# Patient Record
Sex: Female | Born: 1945
Health system: Southern US, Community
[De-identification: ages and names within clinical notes are randomized; demographics above are authoritative.]

## PROBLEM LIST (undated history)

## (undated) DIAGNOSIS — G4733 Obstructive sleep apnea (adult) (pediatric): Secondary | ICD-10-CM

## (undated) DIAGNOSIS — K589 Irritable bowel syndrome without diarrhea: Secondary | ICD-10-CM

## (undated) DIAGNOSIS — M1990 Unspecified osteoarthritis, unspecified site: Secondary | ICD-10-CM

## (undated) DIAGNOSIS — F32A Depression, unspecified: Secondary | ICD-10-CM

## (undated) DIAGNOSIS — Z9889 Other specified postprocedural states: Secondary | ICD-10-CM

## (undated) DIAGNOSIS — Z8719 Personal history of other diseases of the digestive system: Secondary | ICD-10-CM

## (undated) DIAGNOSIS — E119 Type 2 diabetes mellitus without complications: Secondary | ICD-10-CM

## (undated) DIAGNOSIS — Z87442 Personal history of urinary calculi: Secondary | ICD-10-CM

## (undated) DIAGNOSIS — Z86010 Personal history of colonic polyps: Secondary | ICD-10-CM

## (undated) DIAGNOSIS — K209 Esophagitis, unspecified without bleeding: Secondary | ICD-10-CM

## (undated) DIAGNOSIS — K219 Gastro-esophageal reflux disease without esophagitis: Secondary | ICD-10-CM

## (undated) DIAGNOSIS — G4730 Sleep apnea, unspecified: Secondary | ICD-10-CM

## (undated) DIAGNOSIS — N200 Calculus of kidney: Secondary | ICD-10-CM

## (undated) DIAGNOSIS — J45909 Unspecified asthma, uncomplicated: Secondary | ICD-10-CM

## (undated) DIAGNOSIS — T7840XA Allergy, unspecified, initial encounter: Secondary | ICD-10-CM

## (undated) DIAGNOSIS — K449 Diaphragmatic hernia without obstruction or gangrene: Secondary | ICD-10-CM

## (undated) DIAGNOSIS — D869 Sarcoidosis, unspecified: Secondary | ICD-10-CM

## (undated) DIAGNOSIS — E785 Hyperlipidemia, unspecified: Secondary | ICD-10-CM

## (undated) DIAGNOSIS — R011 Cardiac murmur, unspecified: Secondary | ICD-10-CM

## (undated) DIAGNOSIS — I639 Cerebral infarction, unspecified: Secondary | ICD-10-CM

## (undated) DIAGNOSIS — H269 Unspecified cataract: Secondary | ICD-10-CM

## (undated) DIAGNOSIS — F419 Anxiety disorder, unspecified: Secondary | ICD-10-CM

## (undated) DIAGNOSIS — F329 Major depressive disorder, single episode, unspecified: Secondary | ICD-10-CM

## (undated) DIAGNOSIS — R0600 Dyspnea, unspecified: Secondary | ICD-10-CM

## (undated) DIAGNOSIS — H9190 Unspecified hearing loss, unspecified ear: Secondary | ICD-10-CM

## (undated) DIAGNOSIS — I10 Essential (primary) hypertension: Secondary | ICD-10-CM

## (undated) HISTORY — DX: Sleep apnea, unspecified: G47.30

## (undated) HISTORY — DX: Esophagitis, unspecified without bleeding: K20.90

## (undated) HISTORY — PX: TONSILLECTOMY AND ADENOIDECTOMY: SUR1326

## (undated) HISTORY — PX: NASAL SEPTUM SURGERY: SHX37

## (undated) HISTORY — DX: Other specified postprocedural states: Z98.890

## (undated) HISTORY — DX: Gastro-esophageal reflux disease without esophagitis: K21.9

## (undated) HISTORY — DX: Calculus of kidney: N20.0

## (undated) HISTORY — DX: Esophagitis, unspecified: K20.9

## (undated) HISTORY — DX: Depression, unspecified: F32.A

## (undated) HISTORY — PX: PARTIAL HYSTERECTOMY: SHX80

## (undated) HISTORY — DX: Allergy, unspecified, initial encounter: T78.40XA

## (undated) HISTORY — PX: CATARACT EXTRACTION: SUR2

## (undated) HISTORY — DX: Hyperlipidemia, unspecified: E78.5

## (undated) HISTORY — DX: Diaphragmatic hernia without obstruction or gangrene: K44.9

## (undated) HISTORY — DX: Major depressive disorder, single episode, unspecified: F32.9

## (undated) HISTORY — DX: Type 2 diabetes mellitus without complications: E11.9

## (undated) HISTORY — DX: Unspecified cataract: H26.9

## (undated) HISTORY — DX: Irritable bowel syndrome, unspecified: K58.9

## (undated) HISTORY — DX: Cerebral infarction, unspecified: I63.9

## (undated) HISTORY — DX: Personal history of other diseases of the digestive system: Z87.19

## (undated) HISTORY — DX: Sarcoidosis, unspecified: D86.9

## (undated) HISTORY — DX: Obstructive sleep apnea (adult) (pediatric): G47.33

## (undated) HISTORY — DX: Unspecified hearing loss, unspecified ear: H91.90

## (undated) HISTORY — DX: Anxiety disorder, unspecified: F41.9

## (undated) HISTORY — DX: Unspecified asthma, uncomplicated: J45.909

## (undated) HISTORY — PX: TUBAL LIGATION: SHX77

## (undated) HISTORY — DX: Personal history of colonic polyps: Z86.010

## (undated) HISTORY — PX: HEMORRHOID SURGERY: SHX153

## (undated) HISTORY — PX: BLADDER SURGERY: SHX569

## (undated) HISTORY — DX: Essential (primary) hypertension: I10

---

## 1997-02-04 ENCOUNTER — Encounter: Payer: Self-pay | Admitting: Internal Medicine

## 1999-03-23 ENCOUNTER — Encounter: Payer: Self-pay | Admitting: Internal Medicine

## 1999-03-23 ENCOUNTER — Ambulatory Visit (HOSPITAL_COMMUNITY): Admission: RE | Admit: 1999-03-23 | Discharge: 1999-03-23 | Payer: Self-pay | Admitting: Internal Medicine

## 1999-08-19 ENCOUNTER — Other Ambulatory Visit: Admission: RE | Admit: 1999-08-19 | Discharge: 1999-08-19 | Payer: Self-pay | Admitting: Gynecology

## 2001-03-27 ENCOUNTER — Encounter (INDEPENDENT_AMBULATORY_CARE_PROVIDER_SITE_OTHER): Payer: Self-pay

## 2001-03-27 ENCOUNTER — Ambulatory Visit: Admission: RE | Admit: 2001-03-27 | Discharge: 2001-03-27 | Payer: Self-pay | Admitting: Internal Medicine

## 2001-04-25 ENCOUNTER — Encounter: Payer: Self-pay | Admitting: Thoracic Surgery

## 2001-04-27 ENCOUNTER — Ambulatory Visit (HOSPITAL_COMMUNITY): Admission: RE | Admit: 2001-04-27 | Discharge: 2001-04-27 | Payer: Self-pay | Admitting: Thoracic Surgery

## 2001-04-27 ENCOUNTER — Encounter (INDEPENDENT_AMBULATORY_CARE_PROVIDER_SITE_OTHER): Payer: Self-pay | Admitting: *Deleted

## 2001-05-29 ENCOUNTER — Encounter: Payer: Self-pay | Admitting: Thoracic Surgery

## 2001-05-29 ENCOUNTER — Encounter: Admission: RE | Admit: 2001-05-29 | Discharge: 2001-05-29 | Payer: Self-pay | Admitting: Thoracic Surgery

## 2002-02-05 ENCOUNTER — Other Ambulatory Visit: Admission: RE | Admit: 2002-02-05 | Discharge: 2002-02-05 | Payer: Self-pay | Admitting: Gynecology

## 2006-06-20 ENCOUNTER — Ambulatory Visit: Payer: Self-pay | Admitting: Internal Medicine

## 2006-11-03 ENCOUNTER — Ambulatory Visit: Payer: Self-pay | Admitting: Internal Medicine

## 2006-11-17 ENCOUNTER — Ambulatory Visit: Payer: Self-pay | Admitting: Internal Medicine

## 2006-12-15 ENCOUNTER — Ambulatory Visit: Payer: Self-pay | Admitting: Internal Medicine

## 2007-01-12 ENCOUNTER — Ambulatory Visit: Payer: Self-pay | Admitting: Internal Medicine

## 2007-03-13 ENCOUNTER — Ambulatory Visit: Payer: Self-pay | Admitting: Internal Medicine

## 2007-03-27 ENCOUNTER — Ambulatory Visit: Payer: Self-pay | Admitting: Internal Medicine

## 2007-06-13 ENCOUNTER — Ambulatory Visit: Payer: Self-pay | Admitting: Internal Medicine

## 2007-08-07 ENCOUNTER — Inpatient Hospital Stay (HOSPITAL_COMMUNITY): Admission: RE | Admit: 2007-08-07 | Discharge: 2007-08-08 | Payer: Self-pay | Admitting: Obstetrics and Gynecology

## 2007-08-08 ENCOUNTER — Encounter (INDEPENDENT_AMBULATORY_CARE_PROVIDER_SITE_OTHER): Payer: Self-pay | Admitting: *Deleted

## 2007-09-11 ENCOUNTER — Ambulatory Visit: Payer: Self-pay | Admitting: Internal Medicine

## 2007-11-16 DIAGNOSIS — K589 Irritable bowel syndrome without diarrhea: Secondary | ICD-10-CM

## 2007-11-16 DIAGNOSIS — K219 Gastro-esophageal reflux disease without esophagitis: Secondary | ICD-10-CM

## 2007-11-16 DIAGNOSIS — D869 Sarcoidosis, unspecified: Secondary | ICD-10-CM

## 2007-11-16 DIAGNOSIS — J4550 Severe persistent asthma, uncomplicated: Secondary | ICD-10-CM

## 2007-11-16 HISTORY — DX: Severe persistent asthma, uncomplicated: J45.50

## 2008-05-02 DIAGNOSIS — K209 Esophagitis, unspecified without bleeding: Secondary | ICD-10-CM

## 2008-05-02 DIAGNOSIS — I10 Essential (primary) hypertension: Secondary | ICD-10-CM

## 2008-05-02 DIAGNOSIS — R159 Full incontinence of feces: Secondary | ICD-10-CM

## 2008-05-02 DIAGNOSIS — H9190 Unspecified hearing loss, unspecified ear: Secondary | ICD-10-CM

## 2008-05-02 DIAGNOSIS — K449 Diaphragmatic hernia without obstruction or gangrene: Secondary | ICD-10-CM

## 2008-05-02 HISTORY — DX: Diaphragmatic hernia without obstruction or gangrene: K44.9

## 2008-05-02 HISTORY — DX: Full incontinence of feces: R15.9

## 2008-05-02 HISTORY — DX: Esophagitis, unspecified without bleeding: K20.90

## 2008-05-02 HISTORY — DX: Essential (primary) hypertension: I10

## 2008-05-02 HISTORY — DX: Unspecified hearing loss, unspecified ear: H91.90

## 2008-05-05 ENCOUNTER — Ambulatory Visit: Payer: Self-pay | Admitting: Internal Medicine

## 2008-05-13 ENCOUNTER — Ambulatory Visit: Payer: Self-pay | Admitting: Internal Medicine

## 2008-05-13 ENCOUNTER — Encounter: Payer: Self-pay | Admitting: Internal Medicine

## 2008-05-15 ENCOUNTER — Encounter: Payer: Self-pay | Admitting: Internal Medicine

## 2008-05-26 ENCOUNTER — Ambulatory Visit: Payer: Self-pay | Admitting: Internal Medicine

## 2008-05-27 LAB — CONVERTED CEMR LAB
Fecal Occult Blood: NEGATIVE
OCCULT 1: NEGATIVE
OCCULT 2: NEGATIVE
OCCULT 3: NEGATIVE
OCCULT 4: NEGATIVE
OCCULT 5: NEGATIVE

## 2009-07-24 ENCOUNTER — Encounter: Admission: RE | Admit: 2009-07-24 | Discharge: 2009-07-24 | Payer: Self-pay | Admitting: Specialist

## 2009-11-28 DIAGNOSIS — Z86010 Personal history of colonic polyps: Secondary | ICD-10-CM

## 2009-11-28 DIAGNOSIS — Z860101 Personal history of adenomatous and serrated colon polyps: Secondary | ICD-10-CM | POA: Insufficient documentation

## 2009-11-28 HISTORY — DX: Personal history of colonic polyps: Z86.010

## 2009-11-28 HISTORY — DX: Personal history of adenomatous and serrated colon polyps: Z86.0101

## 2010-03-03 ENCOUNTER — Ambulatory Visit: Payer: Self-pay | Admitting: Internal Medicine

## 2010-03-03 DIAGNOSIS — R159 Full incontinence of feces: Secondary | ICD-10-CM | POA: Insufficient documentation

## 2010-03-03 DIAGNOSIS — R1031 Right lower quadrant pain: Secondary | ICD-10-CM

## 2010-03-03 DIAGNOSIS — E119 Type 2 diabetes mellitus without complications: Secondary | ICD-10-CM

## 2010-03-03 HISTORY — DX: Full incontinence of feces: R15.9

## 2010-03-03 HISTORY — DX: Type 2 diabetes mellitus without complications: E11.9

## 2010-03-03 HISTORY — DX: Right lower quadrant pain: R10.31

## 2010-04-02 ENCOUNTER — Ambulatory Visit: Payer: Self-pay | Admitting: Internal Medicine

## 2010-04-06 ENCOUNTER — Encounter: Payer: Self-pay | Admitting: Internal Medicine

## 2010-12-28 NOTE — Letter (Signed)
Summary: [facility] Instructions  [name]  [address] [place], [place] [address]   Phone: [phone]  Fax: [phone]       [name]    [birth year]    MRN: [id number]       Procedure Day /Date: Friday May 6th, 2011     Arrival Time: 12:30pm     Procedure Time:1:30pm     Location of Procedure:                    _x _  [facility] (4th Floor)    PREPARATION FOR COLONOSCOPY WITH MIRALAX  Starting 5 days prior to your procedure 03/28/10 do not eat nuts, seeds, popcorn, corn, beans, peas,  salads, or any raw vegetables.  Do not take any fiber supplements (e.g. Metamucil, Citrucel, and Benefiber). ____________________________________________________________________________________________________   THE DAY BEFORE YOUR PROCEDURE         DATE: 04/01/10 DAY: Thursday  1   Drink clear liquids the entire day-NO SOLID FOOD  2   Do not drink anything colored red or purple.  Avoid juices with pulp.  No orange juice.  3   Drink at least 64 oz. (8 glasses) of fluid/clear liquids during the day to prevent dehydration and help the prep work efficiently.  CLEAR LIQUIDS INCLUDE: Water Jello Ice Popsicles Tea (sugar ok, no milk/cream) Powdered fruit flavored drinks Coffee (sugar ok, no milk/cream) Gatorade Juice: apple, white grape, white cranberry  Lemonade Clear bullion, consomm, broth Carbonated beverages (any kind) Strained chicken noodle soup Hard Candy  4   Mix the entire bottle of Miralax with 64 oz. of Gatorade/Powerade in the morning and put in the refrigerator to chill.  5   At 3:00 pm take 2 Dulcolax/Bisacodyl tablets.  6   At 4:30 pm take one Reglan/Metoclopramide tablet.  7  Starting at 5:00 pm drink one 8 oz glass of the Miralax mixture every 15-20 minutes until you have finished drinking the entire 64 oz.  You should finish drinking prep around 7:30 or 8:00 pm.  8   If you are nauseated, you may take the 2nd Reglan/Metoclopramide  tablet at 6:30 pm.        9    At 8:00 pm take 2 more DULCOLAX/Bisacodyl tablets.     THE DAY OF YOUR PROCEDURE      DATE:  04/02/10 DAY: Friday  You may drink clear liquids until 11:30am  (2 HOURS BEFORE PROCEDURE).   MEDICATION INSTRUCTIONS  Unless otherwise instructed, you should take regular prescription medications with a small sip of water as early as possible the morning of your procedure.         OTHER INSTRUCTIONS  You will need a responsible adult at least 65 years of age to accompany you and drive you home.   This person must remain in the waiting room during your procedure.  Wear loose fitting clothing that is easily removed.  Leave jewelry and other valuables at home.  However, you may wish to bring a book to read or an iPod/MP3 player to listen to music as you wait for your procedure to start.  Remove all body piercing jewelry and leave at home.  Total time from sign-in until discharge is approximately 2-3 hours.  You should go home directly after your procedure and rest.  You can resume normal activities the day after your procedure.  The day of your procedure you should not:   Drive   Make legal decisions  Operate machinery   Drink alcohol   Return to work  You will receive specific instructions about eating, activities and medications before you leave.   The above instructions have been reviewed and explained to me by   Marchelle Folks.     I fully understand and can verbalize these instructions _____________________________ Date _______

## 2010-12-28 NOTE — Discharge Summary (Signed)
Summary: Genuine Stress Incontinence  NAME:  [name], [name]              ACCOUNT NO.:  [id number]      MEDICAL RECORD NO.:  [id number]          PATIENT TYPE:  INP      LOCATION:  9313                          FACILITY:  [facility]      PHYSICIAN:  Randye Lobo, M.D.   DATE OF BIRTH:  [birth year]      DATE OF ADMISSION:  08/07/2007   DATE OF DISCHARGE:  08/08/2007                                  DISCHARGE SUMMARY      ADMISSION DIAGNOSES:   1. Genuine stress incontinence.   2. Incomplete vaginal prolapse.      DISCHARGE DIAGNOSES:   1. Genuine stress incontinence.   2. Incomplete vaginal prolapse.   3. She is status post Monarch trans obturator suburethral sling,       cystoscopy, and anterior posterior colporrhaphy.   4. Postoperative urinary retention.      ADMISSION HISTORY AND PHYSICAL EXAMINATION:  The patient is a 65-year-   old para 2 Caucasian female, status post total vaginal hysterectomy in   2001 who presented with a complaint of urinary incontinence with   stressful maneuvers.  In the office, the patient was noted to have a   third-degree cystocele and a second-degree rectocele.  There was good   vaginal apical support.  The cervix was absent, and there were no   palpable midline nor adnexal masses.      Preoperative urodynamic studies documented genuine stress incontinence.      The patient wished for surgical treatment of her pelvic organ prolapse   and stress incontinence.      HOSPITAL COURSE:  The patient was admitted on August 07, 2007.  At   which time she underwent a Monarch trans obturator suburethral sling and   cystoscopy along with an anterior and posterior colporrhaphy under the   direction of Dr. Conley Simmonds and with the assistance of Dr. Lodema Hong   at the [facility].  The patient's surgery was   uncomplicated.      Postoperatively, the patient had a morphine PCA for pain control.  On   postoperative day #1, the PCA was  discontinued, and the patient was   started on oral Percocet.  The patient's vaginal packing was removed,   and she was started on voiding trials.  The patient was noted to have   postvoid residuals between 100 and 750.  Her Foley catheter was   therefore replaced and left to gravity drainage at the time of her   discharge.  The patient's diet was slowly advanced to normal which she   was tolerating well during the hospitalization.  The patient was able to   ambulate independently.  The patient had no significant fevers during   her hospitalization.  Her discharge hemoglobin was 11.6, and she was   tolerating this well.      The patient was found to be in good condition and ready for discharge on   postoperative day #1.      DISCHARGE INSTRUCTIONS:   1. Discharged to home.  2. The patient will take the following medications:  Percocet 5/325 mg       1 to 2 p.o. q 4 to 6 hours p.r.n. pain, ciprofloxacin 250 mg p.o.       b.i.d. x 5 days.   3. The patient will follow a regular diet.   4. The patient will have decreased activity at home for the next 6       weeks.  She will not lift anything over 10 pounds for the next 12       weeks.  She will not drive for 2 weeks.   5. The patient will follow up in the office in 5 days.   6. The patient will call if she experiences any problems with fever,       nausea and vomiting, pain uncontrolled by her medication, heavy       vaginal bleeding, difficulty with her catheter, or any other       concern.               Randye Lobo, M.D.   Electronically Signed            BES/MEDQ  D:  10/27/2007  T:  10/27/2007  Job:  [id number]

## 2010-12-28 NOTE — Medication Information (Signed)
Summary: Welchol Approved/[facility]  Welchol Approved/[facility]   Imported By: Sherian Rein 04/07/2010 11:25:07  _____________________________________________________________________  External Attachment:    Type:   Image     Comment:   External Document

## 2010-12-28 NOTE — Procedures (Signed)
Summary: Colonoscopy  Patient: [name] Note: All result statuses are Final unless otherwise noted.  Tests: (1) Colonoscopy (COL)   COL Colonoscopy           DONE     [facility]     [address]     [place], [place]  [address]           COLONOSCOPY PROCEDURE REPORT           PATIENT:  [name], [name]  MR#:  [id number]     BIRTHDATE:  1946-07-27, 63 yrs. old  GENDER:  female     ENDOSCOPIST:  Hedwig Morton. Juanda Chance, MD     REF. BY:  Lucila Maine, M.D.     PROCEDURE DATE:  04/02/2010     PROCEDURE:  Colonoscopy 95621     ASA CLASS:  Class I     INDICATIONS:  Routine Risk Screening also IBS/diarrhea, hx of     incontinence due to decreased rectal tone     MEDICATIONS:   Versed 7 mg, Fentanyl 75 mcg           DESCRIPTION OF PROCEDURE:   After the risks benefits and     alternatives of the procedure were thoroughly explained, informed     consent was obtained.  Digital rectal exam was performed and     revealed decreased sphincter tone.   The LB CF-[id number]     endoscope was introduced through the anus and advanced to the     cecum, which was identified by both the appendix and ileocecal     valve, without limitations.  The quality of the prep was good,     using MoviPrep.  The instrument was then slowly withdrawn as the     colon was fully examined.     <<PROCEDUREIMAGES>>           FINDINGS:  Two polyps were found. 2 cecal polyps 3 and 4 mm The     polyps were removed using cold biopsy forceps (see image2 and     image1).  This was otherwise a normal examination of the colon.     r/o miocroscopic colitis Multiple biopsies were obtained and sent     to pathology (see image3, image4, image5, and image6).     Retroflexed views in the rectum revealed no abnormalities.    The     scope was then withdrawn from the patient and the procedure     completed.           COMPLICATIONS:  None     ENDOSCOPIC IMPRESSION:     1) Two polyps     2) Otherwise normal  examination     s/p random biopsies of the colon to r/o microscopic colitis     decreased shincter tone     RECOMMENDATIONS:     1) Await pathology results     add Welchol 625 mg, # 60, 2 po dq for diarrhea, 2 refills,     REPEAT EXAM:  In 5 year(s) for.           ______________________________     Hedwig Morton. Juanda Chance, MD           CC:           n.     eSIGNED:   Hedwig Morton. Ostin Mathey at 04/02/2010 01:59 PM           Benay Pillow, [id number]  Note: An exclamation mark (!) indicates  a result that was not dispersed into the flowsheet. Document Creation Date: 04/02/2010 1:59 PM _______________________________________________________________________  (1) Order result status: Final Collection or observation date-time: 04/02/2010 13:45 Requested date-time:  Receipt date-time:  Reported date-time:  Referring Physician:   Ordering Physician: Lina Sar [id number]) Specimen Source:  Source: Launa Grill Order Number: [id number] Lab site:   Appended Document: Colonoscopy     Procedures Next Due Date:    Colonoscopy: 03/2015

## 2010-12-28 NOTE — Letter (Signed)
Summary: Patient Notice- Polyp Results  [facility]  [address] [place], [place] [address]   Phone: [phone]  Fax: [phone]        Apr 06, 2010 MRN: [id number]    [name] [address] [place], [place]  [address]    Dear [name],  I am pleased to inform you that the colon polyp(s) removed during your recent colonoscopy was (were) found to be benign (no cancer detected) upon pathologic examination.The polyp was adenomatous ( precancerous). The biopsies from Your colon did not show any colitis.  I recommend you have a repeat colonoscopy examination in 5_ years to look for recurrent polyps, as having colon polyps increases your risk for having recurrent polyps or even colon cancer in the future.  Should you develop new or worsening symptoms of abdominal pain, bowel habit changes or bleeding from the rectum or bowels, please schedule an evaluation with either your primary care physician or with me.  Additional information/recommendations:  x__ No further action with gastroenterology is needed at this time. Please      follow-up with your primary care physician for your other healthcare      needs.  __ Please call [phone] to schedule a return visit to review your      situation.  __ Please keep your follow-up visit as already scheduled.  _x_ Continue treatment plan as outlined the day of your exam.  Please call us if you are having persistent problems or have questions about your condition that have not been fully answered at this time.  Sincerely,  Hart Carwin MD  This letter has been electronically signed by your physician.  Appended Document: Patient Notice- Polyp Results letter mailed 5.11.11

## 2010-12-28 NOTE — Assessment & Plan Note (Signed)
Summary: fecal incontince, abd cramping....em   History of Present Illness Visit Type: Follow-up Visit Primary GI MD: Lina Sar MD Primary Provider: Lucila Maine, MD  Requesting Provider: n/a Chief Complaint: Fecal Incontinence and abd cramping  History of Present Illness:   65 year old white female with irritable bowel syndrome and intermittent fecal incontinence due to decreased rectal sphincter tone. Last colonoscopy in 2000 showed decreased rectal tone and no evidence of Crohn's disease. She has a history of well gastroesophageal reflux status post upper endoscopy in March 1998 showing 3 cm hiatal hernia with stricture . Patient was dilated with 17 mm Savary dilator. She was evaluated for fecal incontinence at the clinic at [facility] in 2003 by Dr Doy Hutching and underwent  cystocele repair   GI Review of Systems      Denies abdominal pain, acid reflux, belching, bloating, chest pain, dysphagia with liquids, dysphagia with solids, heartburn, loss of appetite, nausea, vomiting, vomiting blood, weight loss, and  weight gain.      Reports fecal incontinence.     Denies anal fissure, black tarry stools, change in bowel habit, constipation, diarrhea, diverticulosis, heme positive stool, hemorrhoids, irritable bowel syndrome, jaundice, light color stool, liver problems, rectal bleeding, and  rectal pain.    Current Medications (verified): 1)  Hyzaar 100-12.5 Mg  Tabs (Losartan Potassium-Hctz) .... Take 1 Tablet By Mouth Once A Day 2)  Citrucel   Powd (Methylcellulose (Laxative)) .Marland Kitchen.. 1 Tsp Two Times A Day 3)  Ibuprofen 200 Mg  Caps (Ibuprofen) .... As Needed 4)  Womens Multivitamin Plus   Tabs (Multiple Vitamins-Minerals) .... Once Daily 5)  Vitamin D 1000 Unit  Caps (Cholecalciferol) .... Take 1 Twice Weekly 6)  Vesicare 5 Mg Tabs (Solifenacin Succinate) .... One Tablet By Mouth Once Daily 7)  Omeprazole 40 Mg Cpdr (Omeprazole) .... One Tablet By Mouth Two Times A Day 8)  Metformin Hcl 500 Mg  Tabs (Metformin Hcl) .... One Tablet By Mouth Once Daily 9)  Fexofenadine Hcl 180 Mg Tabs (Fexofenadine Hcl) .... One Tablet By Mouth Once Daily 10)  Citalopram Hydrobromide 20 Mg Tabs (Citalopram Hydrobromide) .... One Tablet By Mouth Once Daily 11)  Qvar 80 Mcg/act Aers (Beclomethasone Dipropionate) .... Two Puffs Daily 12)  Symbicort 160-4.5 Mcg/act Aero (Budesonide-Formoterol Fumarate) .... Twice Daily  Allergies (verified): No Known Drug Allergies  Past History:  Past Medical History: Reviewed history from 05/05/2008 and no changes required. Current Problems:  DECREASED HEARING (ICD-389.9) ESOPHAGITIS (ICD-530.10) HIATAL HERNIA (ICD-553.3) RECTAL INCONTINENCE (ICD-787.6) HYPERTENSION (ICD-401.9) ASTHMA (ICD-493.90) SARCOIDOSIS (ICD-135) IRRITABLE BOWEL SYNDROME (ICD-564.1) GERD (ICD-530.81)  Past Surgical History: Reviewed history from 05/02/2008 and no changes required. partial hysterectomy T & A  Repair of deviated septum Tubal Ligation  Family History: Reviewed history from 05/05/2008 and no changes required. Family History of Esophageal Cancer: Father No FH of Colon Cancer: Family History of Diabetes:   Social History: Reviewed history from 05/02/2008 and no changes required. Patient has never smoked.  Alcohol Use - no Daily Caffeine Use Illicit Drug Use - no  Review of Systems  The patient denies allergy/sinus, anemia, anxiety-new, arthritis/joint pain, back pain, blood in urine, breast changes/lumps, change in vision, confusion, cough, coughing up blood, depression-new, fainting, fatigue, fever, headaches-new, hearing problems, heart murmur, heart rhythm changes, itching, menstrual pain, muscle pains/cramps, night sweats, nosebleeds, pregnancy symptoms, shortness of breath, skin rash, sleeping problems, sore throat, swelling of feet/legs, swollen lymph glands, thirst - excessive , urination - excessive , urination changes/pain, urine leakage, vision changes,  and voice  change.         Pertinent positive and negative review of systems were noted in the above HPI. All other ROS was otherwise negative.   Vital Signs:  Patient profile:   65 year old female Height:      62 inches Weight:      164 pounds BMI:     30.10 BSA:     1.76 Pulse rate:   84 / minute Pulse rhythm:   regular BP sitting:   132 / 80  (left arm) Cuff size:   regular  Vitals Entered By: Ok Anis CMA (March 03, 2010 9:43 AM)   Physical Exam  General:  Well developed, well nourished, no acute distress. Eyes:  PERRLA, no icterus. Mouth:  No deformity or lesions, dentition normal. Neck:  Supple; no masses or thyromegaly. Lungs:  Clear throughout to auscultation. Heart:  Regular rate and rhythm; no murmurs, rubs,  or bruits. Abdomen:  obese protuberant abdomen which is tender in right lower quadrant. There is no palpable mass. There is no ascites. Bowel sounds are normal active. Left lower quadrant is normal Rectal:  decreased rectal sphincter time with salt Hemoccult-negative stool in the rectal ampulla Extremities:  No clubbing, cyanosis, edema or deformities noted. Skin:  Intact without significant lesions or rashes. Psych:  Alert and cooperative. Normal mood and affect.   Impression & Recommendations:  Problem # 1:  RECTAL INCONTINENCE (ICD-787.6) decreased rectal sphincter tone. Intermittent diarrhea attributed in the past to irritable bowel syndrome. Last colonoscopy April 2000. She is due to have a repeat colonoscopy. We will obtain out random biopsies to rule out microscopic colitis  Problem # 2:  Family Hx of ADENOCARCINOMA, ESOPHAGUS (ICD-150.9) last upper endoscopy in June 2009 showed hiatal hernia with mild esophageal stricture. She was dilated to 16 mm dilator  Other Orders: Colonoscopy (Colon)  Patient Instructions: 1)  high-fiber diet 2)  Fiber supplements as needed 3)  Colonoscopy with biopsies 4)  Consider use of antispasmodics on depending on  the results of the colonoscopy 5)  Copy sent to : Dr R.Scott 6)  The medication list was reviewed and reconciled.  All changed / newly prescribed medications were explained.  A complete medication list was provided to the patient / caregiver. Prescriptions: DULCOLAX 5 MG  TBEC (BISACODYL) Day before procedure take 2 at 3pm and 2 at 8pm.  #4 x 0   Entered by:   Christie Nottingham CMA (AAMA)   Authorized by:   Hart Carwin MD   Signed by:   Hart Carwin MD on 03/03/2010   Method used:   Electronically to        [facility] (retail)       [address]       [place], [place]  [address]       Ph: [phone]       Fax: [phone]   RxID:   [id number] REGLAN 10 MG  TABS (METOCLOPRAMIDE HCL) As per prep instructions.  #2 x 0   Entered by:   Christie Nottingham CMA (AAMA)   Authorized by:   Hart Carwin MD   Signed by:   Hart Carwin MD on 03/03/2010   Method used:   Electronically to        [facility] (retail)       [address]  [place], [place]  [address]       Ph: [phone]       Fax: [phone]   RxID:   [id number] MIRALAX   POWD (POLYETHYLENE GLYCOL 3350) As per prep  instructions.  #255gm x 0   Entered by:   Christie Nottingham CMA (AAMA)   Authorized by:   Hart Carwin MD   Signed by:   Hart Carwin MD on 03/03/2010   Method used:   Electronically to        [facility] (retail)       [address]       [place], [place]  [address]       Ph: [phone]       Fax: [phone]   RxID:   [id number]

## 2010-12-28 NOTE — Miscellaneous (Signed)
Summary: welchol rx.  Clinical Lists Changes  Medications: Added new medication of WELCHOL 625 MG TABS (COLESEVELAM HCL) take 2 by mouth for diarrhea - Signed Rx of WELCHOL 625 MG TABS (COLESEVELAM HCL) take 2 by mouth for diarrhea;  #60 x 2;  Signed;  Entered by: Darlyn Read RN;  Authorized by: Hart Carwin MD;  Method used: Electronically to [facility] [id number]*, [address], [place], [place], [place]  [address], Ph: [phone] or [phone], Fax: [phone]    Prescriptions: WELCHOL 625 MG TABS (COLESEVELAM HCL) take 2 by mouth for diarrhea  #60 x 2   Entered by:   Darlyn Read RN   Authorized by:   Hart Carwin MD   Signed by:   Darlyn Read RN on 04/02/2010   Method used:   Electronically to        [facility] [id number]* (retail)       [address]       [place], [place]  [address]       Ph: [phone] or [phone]       Fax: [phone]   RxID:   [id number]

## 2010-12-28 NOTE — Letter (Signed)
Summary: Diabetic Instructions  [facility]  [address] [place], [place] [address]   Phone: [phone]  Fax: [phone]    [name] 03/26/1946 MRN: [id number]   _ x _   ORAL DIABETIC MEDICATION INSTRUCTIONS           (metformin) The day before your procedure:   Take your diabetic pill as you do normally  The day of your procedure:   Do not take your diabetic pill    We will check your blood sugar levels during the admission process and again in Recovery before discharging you home

## 2011-01-11 ENCOUNTER — Ambulatory Visit (INDEPENDENT_AMBULATORY_CARE_PROVIDER_SITE_OTHER): Payer: Managed Care, Other (non HMO) | Admitting: Cardiovascular Disease

## 2011-01-11 DIAGNOSIS — E7800 Pure hypercholesterolemia, unspecified: Secondary | ICD-10-CM

## 2011-01-11 DIAGNOSIS — R079 Chest pain, unspecified: Secondary | ICD-10-CM

## 2011-01-11 DIAGNOSIS — R9431 Abnormal electrocardiogram [ECG] [EKG]: Secondary | ICD-10-CM

## 2011-01-11 DIAGNOSIS — I10 Essential (primary) hypertension: Secondary | ICD-10-CM

## 2011-01-18 ENCOUNTER — Telehealth (INDEPENDENT_AMBULATORY_CARE_PROVIDER_SITE_OTHER): Payer: Self-pay | Admitting: *Deleted

## 2011-01-19 ENCOUNTER — Ambulatory Visit (HOSPITAL_COMMUNITY): Payer: Managed Care, Other (non HMO) | Attending: Cardiology

## 2011-01-19 ENCOUNTER — Encounter: Payer: Self-pay | Admitting: Cardiovascular Disease

## 2011-01-19 DIAGNOSIS — R9431 Abnormal electrocardiogram [ECG] [EKG]: Secondary | ICD-10-CM | POA: Insufficient documentation

## 2011-01-19 DIAGNOSIS — R079 Chest pain, unspecified: Secondary | ICD-10-CM | POA: Insufficient documentation

## 2011-01-19 DIAGNOSIS — I498 Other specified cardiac arrhythmias: Secondary | ICD-10-CM

## 2011-01-19 DIAGNOSIS — R0989 Other specified symptoms and signs involving the circulatory and respiratory systems: Secondary | ICD-10-CM | POA: Insufficient documentation

## 2011-01-19 DIAGNOSIS — R0789 Other chest pain: Secondary | ICD-10-CM

## 2011-01-19 DIAGNOSIS — R0609 Other forms of dyspnea: Secondary | ICD-10-CM | POA: Insufficient documentation

## 2011-01-19 DIAGNOSIS — R0602 Shortness of breath: Secondary | ICD-10-CM

## 2011-01-19 DIAGNOSIS — E119 Type 2 diabetes mellitus without complications: Secondary | ICD-10-CM

## 2011-01-25 NOTE — Assessment & Plan Note (Addendum)
Summary: Cardiology Nuclear Testing  Nuclear Med Background Indications for Stress Test: Evaluation for Ischemia, Abnormal EKG   History: Asthma   Symptoms: Chest Tightness, DOE    Nuclear Pre-Procedure Cardiac Risk Factors: Hypertension, Lipids, NIDDM Caffeine/Decaff Intake: None NPO After: 7:30 AM Lungs: clear IV 0.9% NS with Angio Cath: 22g     IV Site: R Hand IV Started by: Bonnita Levan, RN Chest Size (in) 36     Cup Size B     Height (in): 62 Weight (lb): 161 BMI: 29.55  Nuclear Med Study 1 or 2 day study:  1 day     Stress Test Type:  Eugenie Birks Reading MD:  Charlton Haws, MD     Referring MD:  Judie Petit.Arida Resting Radionuclide:  Technetium 46m Tetrofosmin     Resting Radionuclide Dose:  10.4 mCi  Stress Radionuclide:  Technetium 51m Tetrofosmin     Stress Radionuclide Dose:  33 mCi   Stress Protocol   Lexiscan: 0.4 mg        Nuclear Technologist:  Doyne Keel, CNMT  Rest Procedure  Myocardial perfusion imaging was performed at rest 45 minutes following the intravenous administration of Technetium 34m Tetrofosmin.  Stress Procedure  The patient received IV Lexiscan 0.4 mg over 15-seconds.  Technetium 28m Tetrofosmin injected at 30-seconds.  There were no significant changes with infusion, brief period of Accelerated Junctional ..  Quantitative spect images were obtained after a 45 minute delay.  QPS Raw Data Images:  Normal; no motion artifact; normal heart/lung ratio. Stress Images:  Normal homogeneous uptake in all areas of the myocardium. Rest Images:  Normal homogeneous uptake in all areas of the myocardium. Subtraction (SDS):  Normal Transient Ischemic Dilatation:  1.11  (Normal <1.22)  Lung/Heart Ratio:  .29  (Normal <0.45)  Quantitative Gated Spect Images QGS EDV:  70 ml QGS ESV:  18 ml QGS EF:  74 % QGS cine images:  normal  Findings Normal nuclear study      Overall Impression  Exercise Capacity: Lexiscan with no exercise. BP Response: Normal  blood pressure response. Clinical Symptoms: Nausea ECG Impression: No significant ST segment change suggestive of ischemia. Overall Impression: Normal stress nuclear study. Overall Impression Comments: normal  Appended Document: Cardiology Nuclear Testing pt aware of results

## 2011-01-25 NOTE — Progress Notes (Signed)
Summary: Nuclear Pre-Procedure  Phone Note Outgoing Call Call back at [facility] Phone [phone]   Call placed by: Stanton Kidney, EMT-P,  January 18, 2011 2:39 PM Call placed to: Patient Action Taken: Phone Call Completed Summary of Call: Reviewed information on [employer] Information Sheet (see scanned document for further details).  Spoke with the patient.     Nuclear Med Background Indications for Stress Test: Evaluation for Ischemia, Abnormal EKG   History: Asthma   Symptoms: Chest Tightness, DOE    Nuclear Pre-Procedure Cardiac Risk Factors: Hypertension, Lipids, NIDDM Height (in): 62

## 2011-01-27 ENCOUNTER — Telehealth: Payer: Self-pay | Admitting: Cardiovascular Disease

## 2011-02-03 NOTE — Progress Notes (Signed)
Summary: stress test results  Phone Note Outgoing Call   Call placed by: Dessie Coma  LPN,  January 27, 2011 2:08 PM Call placed to: Patient Summary of Call: LMVM-informed patient per Dr. Kirke Corin, stress test was normal.

## 2011-02-18 NOTE — Letter (Signed)
January 11, 2011   Dr. Lucila Maine  RE:  GAGANDEEP, KOSSMAN MRN:  [id number]  /  DOB:  1946-06-24  Dear Dr. Lorin Picket:  Thank you for referring Ms. Cullinan for further cardiac evaluation.  As you are aware, this is a pleasant 65 year old female with the following problem list: 1. Type 2 diabetes which was diagnosed about 7 years ago. 2. Hypertension. 3. Hyperlipidemia. 4. Asthma which seems to be severe and was diagnosed since childhood. 5. Lung nodules. 6. Multi joint arthritis with suspicion for rheumatoid arthritis     recently diagnosed. 7. Gastroesophageal reflux disease.  HISTORY OF PRESENT ILLNESS:  Ms. Macconnell is here today for evaluation of her exertional dyspnea as well as an abnormal ECG.  Overall she has no previous cardiac history.  There is no reported history of myocardial infarction or stroke.  She has not had any cardiac evaluation except for ECGs.  Recently her routine ECG showed flat T-waves in V1-V2 which was slightly different from her previous one.  Her symptoms include dyspnea with minimal activities.  Her functional capacity is reduced especially with her multiple joint problems.  She does describe occasional episodes of chest tightness which can happen any time and she has always attributed it to her asthma.  There has been no palpitations, presyncope or syncope.  She denies any orthopnea, PND or lower extremity edema.  MEDICATIONS: 1. Symbicort 160/  4.5 once daily. 2. Losartan -  hydrochlorothiazide 100/12.5 mg once daily. 3. Livalo 2 mg once daily. 4. Metformin 500 mg once daily. 5. Allegra 180 mg once daily. 6. Vesicare  5 mg once daily. 7. Omeprazole 40 mg once daily. 8. Citalopram 20 mg once daily. 9. Calcium and vitamin D once daily. 10.Multivitamin once daily. 11.Onglyza 5 mg once daily. 12.Prednisone once daily. 13.Albuterol inhaler as needed.  ALLERGIES:  No known drug allergies.  SOCIAL HISTORY:  Negative for smoking, alcohol or  recreational drug use. She is married and has 2 kids.  PAST SURGICAL HISTORY:  Tonsillectomy, hysterectomy, nose surgery, bladder surgery.  FAMILY HISTORY:  Negative for premature coronary artery disease.  REVIEW OF SYSTEMS:  Remarkable for dyspnea and occasional chest tightness as described above.  She also complains of chronic allergies, constipation, arthritis discomfort as well as anxiety and depression.  A full review of system was performed and is otherwise negative.  PHYSICAL EXAMINATION:  GENERAL:  The patient appears to be at her stated age in no acute distress. VITAL SIGNS:  Weight is 161.6 pounds, blood pressure is 135/66, pulse is 56, oxygen saturation is 97% on room air. HEENT:  Normocephalic, atraumatic. NECK:  No JVD or carotid bruits. RESPIRATORY:  Normal respiratory effort with no use of accessory muscles.  Auscultation reveals normal breath sounds.  CARDIOVASCULAR: Normal PMI.  Normal S1-S2 with no gallops or murmurs. ABDOMEN:  Benign, nontender, nondistended. EXTREMITIES:  With no clubbing, cyanosis or edema. SKIN:  Warm and dry with no rash. Psychiatric:  Conscious,  alert, oriented x3 with normal mood and affect. MUSCULOSKELETAL:  She does have some deformities in the hands due to arthritis; it could be due to rheumatoid.  She has normal muscle strength in the upper and lower extremities.  Electrocardiogram was performed which showed sinus bradycardia with a heart rate of 55 with nonspecific ST and T-wave changes.  IMPRESSION: 1. Exertional dyspnea with slightly abnormal electrocardiogram.  She     has multiple risk factors for coronary artery disease that include     age, type 2 diabetes,  hypertension and hyperlipidemia.  She also     has possible rheumatoid arthritis with an inflammatory state which     was diagnosed recently.  Due to all of that I recommend evaluation     with a cardiac stress test.  She is not able to exercise on a     treadmill due  to her severe arthritis.  Due to her history of     severe asthma, will request dobutamine nuclear stress test for     further evaluation.  In the meantime I agree with aggressive     treatment of her risk factors. 2. Hypertension:  Blood pressure is well-controlled with losartan and     hydrochlorothiazide. 3. Mild sinus bradycardia which seems to be asymptomatic:  I recommend     avoiding any medications that can cause bradycardia such as calcium     channel blockers or beta blockers.  Consider checking thyroid     function if this has not been already done. 4. Hyperlipidemia:  Her most recent lipid profile showed a total     cholesterol of 178, HDL of 60, triglyceride of 90 and an LDL of     100.  We will continue with Livalo.  Goal LDL is less than 100.  The patient will be notified with the results of the stress test.  She will return for a follow-up if it is abnormal.  Thank you for allowing me to participate in the care of your patient.   Sincerely,     Lorine Bears, MD Electronically Signed   MA/MedQ  DD: 01/11/2011  DT: 01/11/2011  Job #: [id number]

## 2011-04-12 NOTE — Assessment & Plan Note (Signed)
[facility]                             PULMONARY OFFICE NOTE   NAME:[name], [name]                     MRN:          [id number]  DATE:09/11/2007                            DOB:          1946/04/09    HISTORY:  This is a 65 year old white female never smoker with  increasing need for rescue therapy when I saw her on June 13, 2007, and  introduced her to the use of Symbicort 160/4.5 two puffs b.i.d.  Her MDI  technique approached 75% after teaching and comes back today doing great  with no need for rescue therapy of any kind since her office visit.  She  is also maintained on Veramyst nasal spray and Nexium b.i.d. for  endoscopy-documented GERD.   Note, the patient is suppose to carry a medication counter that is  revised and unambiguous user-friendly record of her home medication  administration but cannot seem to keep up with it.   PHYSICAL EXAMINATION:  VITAL SIGNS:  She is afebrile with stable vital  signs.  GENERAL APPEARANCE:  She is a pleasant ambulatory white female in no  acute distress.  HEENT:  Unremarkable.  Oropharynx clear.  LUNGS:  Lung fields are completely clear to auscultation and percussion  bilaterally.  CARDIOVASCULAR:  There is regular rhythm without murmurs, rubs, or  gallops.  ABDOMEN:  Soft and benign.  EXTREMITIES:  Warm without calf tenderness, clubbing, cyanosis, or  edema.   Chest x-ray was reviewed and suggests burned out sarcoid with hilar  adenopathy and some calcifications.   IMPRESSION:  No evidence of active asthma.  Whether or not the asthma is  related to sarcoid is somewhat of a moot issue given how well she is  doing.  I have recommended that she continue, therefore, the Symbicort  at the higher dose (she had previously flared on the 80/4.5) and  continue two puffs b.i.d. indefinitely.  On caveat is that she really  had not mastered metered dose inhaler technique and it may well be if  she does well  for three months straight, that she can reduce the lower  dose but I will not necessarily need to see her back for reduction or  for that matter, for refills in her regimen.   I would certainly be happy to see her back here p.r.n. at [name]  discretion.     [name]. [name] Sires, MD, [facility]  Electronically Signed    MBW/MedQ  DD: 09/11/2007  DT: 09/12/2007  Job #: [id number]   cc:   [name], M.D.

## 2011-04-12 NOTE — Op Note (Signed)
[name], [name]              ACCOUNT NO.:  [id number]   MEDICAL RECORD NO.:  [id number]          PATIENT TYPE:  INP   LOCATION:  9399                          FACILITY:  [facility]   PHYSICIAN:  Randye Lobo, M.D.   DATE OF BIRTH:  January 25, 1946   DATE OF PROCEDURE:  08/07/2007  DATE OF DISCHARGE:                               OPERATIVE REPORT   PREOPERATIVE DIAGNOSIS:  1. Genuine stress incontinence.  2. Incomplete vaginal prolapse.   POSTOPERATIVE DIAGNOSIS:  1. Genuine stress incontinence.  2. Incomplete vaginal prolapse.   PROCEDURE:  Anterior and posterior colporrhaphy, Monarch transobturator  sling, cystoscopy.   SURGEON:  Randye Lobo, M.D.   ASSISTANT:  Luvenia Redden, M.D.   ANESTHESIA:  Spinal, IV sedation with propofol.   IV FLUIDS:  1800 mL Ringer's lactate.   ESTIMATED BLOOD LOSS:  125 mL.   URINE OUTPUT:  900 mL.   COMPLICATIONS:  None.   INDICATIONS FOR PROCEDURE:  The patient is a 65 year old para 2  Caucasian female status post total vaginal hysterectomy in 2001, who  presented to her primary gynecologist, Dr. Lodema Hong, with a complaint  of urinary incontinence with stressful maneuvers.  The patient was noted  to also have a cystocele and rectocele on physical examination.  Cystometric testing in the office documented genuine stress incontinence  at a maximum bladder capacity of 360.  The patient was also known to  have a history of significant urge and frequency.  A plan is now made to  proceed with an anterior and posterior colporrhaphy along with a Monarch  transobturator sling and cystoscopy, after risks, benefits, and  alternatives have been reviewed.   FINDINGS:  Examination under anesthesia revealed a second degree  cystocele and a second degree rectocele.  The cervix was noted to be  absent.  Cystoscopy demonstrated patent ureters bilaterally after the  injection of indigo carmine dye.  There was no evidence of a foreign  body in the  bladder or the urethra.  The bladder was visualized  throughout 360 degrees including the bladder dome and trigone which were  noted to be normal.   SPECIMENS:  None.   DESCRIPTION OF PROCEDURE:  The patient was reidentified in the  preoperative hold area.  The patient received ciprofloxacin 400 mg IV  for antibiotic prophylaxis.  She received both TED hose and PAS  stockings for DVT prophylaxis.   In the operating room, a spinal anesthetic was administered and the  patient was given IV sedation with propofol.  She was placed in the  dorsal lithotomy position.  The suprapubic region, vagina, and perineum  were sterilely prepped and draped and a Foley catheter was placed inside  the bladder.   An exam under anesthesia was performed with the findings as noted above.  The procedure began by placing Allis clamps from 1 cm below the urethral  meatus to the vaginal apex.  The vaginal mucosa was then injected with  0.5% lidocaine with 1:200,000 epinephrine.  The vaginal mucosa was  incised sharply in the midline with a scalpel.  The subvaginal tissue  and bladder were dissected off of the overlying vaginal epithelium  bilaterally to the level of the pubic rami cranially and down to the  level of the vaginal apex and the uterosacral ligaments.   The crural incisions were created next.  The adductor longus tendons  were identified bilaterally and incisions were created approximately 2  cm below this and along the lateral margin of the pubic rami  bilaterally.  These were created at the level of the clitoris.  The  Monarch needle passer was then placed through the patient's left crural  incision through the obturator membrane and muscle, behind the pubic  ramus, and in and out through the vaginal incision at the level of the  mid urethra and lateral to this.  The same procedure that was performed  on the patient's left hand side was then repeated on the right hand  side.  The needles were  passed without difficulty.  The Foley catheter  was removed at this time and cystoscopy was performed and the findings  are as noted above.  The Foley catheter was then replaced in the bladder  and was left to gravity drainage.   A sling was attached to the needle passers and was brought out through  the crural incisions bilaterally.  A Kelly clamp was placed between the  sling and the urethra as the plastic sheaths were removed.  There was a  small amount of folding along the sling on the patient's right hand side  and this was grasped at the level of the crural incision and rotated so  that it was lying flat underneath the urethra.  Excess sling material  was trimmed at the level of the crural incisions.  The sling was noted  to be in good position.   The anterior colporrhaphy was performed by placing vertical mattress  sutures of 2-0 Vicryl for excellent reduction of the cystocele.  Excess  vaginal mucosa was trimmed.  Prior to closing the anterior vaginal wall,  a piece of Gelfoam was placed along the patient's right hand side near  the exit site of the sling for some minor oozing which was occurring in  this region.  This provided good hemostasis.  The anterior vaginal wall  was then closed by placing a running locked suture of 2-0 Vicryl.   The posterior colporrhaphy was performed next.  Allis clamps were used  to mark the hymen posteriorly and Allis clamps were placed along the  posterior vaginal mucosa up to the level of the vaginal apex.  The  perineal body and the posterior vaginal mucosa were then injected with  0.5% lidocaine was 1:200,000 of epinephrine.  A triangular wedge of  epithelium was excised from the perineal body and the posterior vaginal  mucosa was then incised vertically with Metzenbaum scissors.  The  perirectal fascia was dissected off of the overlying vaginal mucosa  bilaterally up to the level of the vaginal apex.  There was a small  enterocele which was  encountered and not entered.  Hemostasis during the  dissection was created with monopolar cautery.  The posterior  colporrhaphy repair was performed with a combination of 0 and 2-0 Vicryl  sutures.  The site of the enterocele sac was closed by placing a  pursestring suture at the top of the repair which also incorporated the  inferior aspect of the vagina in the midline.  The fascia, which had  been detached from the vaginal apex at this region, was reattached  during the pursestring suture.  The remainder of the posterior  colporrhaphy was then performed with vertical mattress sutures of 2-0  Vicryl which transitioned down to 0 Vicryl at the level of the perineal  body.   Excess vaginal mucosa was then trimmed and the posterior vaginal wall  was closed with a running locked suture of 2-0 Vicryl.  A crown stitch  of 0 Vicryl was placed at the level of the perineal body to provide  additional support in this region.  The closure along the perineum was  performed in a subcuticular fashion as for an episiotomy repair.  Rectal  examination confirmed the absence of sutures in the rectum.  A vaginal  packing with Estrace cream was placed inside the vagina.  The Foley  catheter was left to gravity drainage.  The crural incisions were closed  with Dermabond.   The patient was cleansed of any remaining Betadine, taken out of the  dorsal lithotomy position, and escorted to the recovery room in stable  and awake condition.  There were no complications to the procedure.  All  needle, instrument, and sponge counts were correct.      Randye Lobo, M.D.  Electronically Signed     BES/MEDQ  D:  08/07/2007  T:  08/07/2007  Job:  [id number]

## 2011-04-12 NOTE — H&P (Signed)
[name], [name]              ACCOUNT NO.:  [id number]   MEDICAL RECORD NO.:  [id number]          PATIENT TYPE:  AMB   LOCATION:  SDC                           FACILITY:  [facility]   PHYSICIAN:  Randye Lobo, M.D.   DATE OF BIRTH:  1946-07-13   DATE OF ADMISSION:  DATE OF DISCHARGE:                              HISTORY & PHYSICAL   CHIEF COMPLAINT:  Urinary incontinence.   HISTORY OF PRESENT ILLNESS:  The patient is a 65 year old para 2  Caucasian female, status post total vaginal hysterectomy in 2001 for  prolapse and dysmenorrhea, who presented to her primary gynecologist, W.  Lodema Hong, M.D. with the complaint of leakage of urine with coughing,  sneezing, and walking.  When the patient was initially seen and  evaluated in February of 2008, she was also noted to have a cystocele  and rectocele.  The patient was referred for multichannel urodynamic  testing which was performed in the office on February 19, 2007.  The  patient's cystometrics study was limited by a maximum bladder capacity  of 162 mL.  The patient was having strong urge and no stress  incontinence could be documented at that time.  The patient has been  treated with anticholinergic therapy including Detrol LA and more  recently Vesicare which is reducing the patient's frequency episodes.   The patient did have simple cystometric studies performed on June 21, 2007, and she did demonstrate evidence of genuine stress incontinence  with a Valsalva maneuver at a maximum bladder capacity of 300 mL.   The patient now desires to proceed with surgical treatment of the  prolapse and incontinence.   PAST OBSTETRIC AND GYNECOLOGIC HISTORY:  Remarkable for two vaginal  deliveries.  The patient did have gestational diabetes during one of her  pregnancies.  She is status post bilateral tubal ligation in 1983.  She  is status post total vaginal hysterectomy for a prolapse and  dysmenorrhea in 1991.  Of note, the patient does  have a history of  dysplasia of the cervix with cryotherapy for low grade dysplasia.  The  patient is status post diagnostic laparoscopy for infertility in 1980.   PAST MEDICAL HISTORY:  1. Hearing disorder.  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      The patient has a partial hearing loss in the right ear and wears a      hearing aid.  2. Status post nasal fracture.  3. History of sarcoid.  The patient is status post lung biopsy.  4. Asthma.  5. Adult onset diabetes mellitus which is diet controlled.  6. Gastroesophageal reflux disease.  7. Hypertension.   PAST SURGICAL HISTORY:  1. Status post tonsillectomy.  2. Status post ear surgery.  3. Status post diagnostic laparoscopy in 1980.  4. Status post bilateral tubal ligation in 1983.  5. Status post total vaginal hysterectomy in 1991.   MEDICATIONS:  1. Vesicare 5 mg p.o. daily.  2. Hyzaar.  3. Prilosec.  4. Citrucel.  5. Serzone.  6. Symbicort inhaler.   ALLERGIES:  No known drug allergies.  SOCIAL HISTORY:  The patient is married.  She has two children.  She  denies the use of tobacco.   REVIEW OF SYSTEMS:  The patient experiences intermittent left scapular  pain.  The patient also has a history of fecal soiling.   FAMILY HISTORY:  There is no history of breast, colon, or ovarian cancer  in the family.   PHYSICAL EXAMINATION:  VITAL SIGNS:  Height 5 feet 2 inches, weight 158  pounds, blood pressure 150/71.  GENERAL:  The patient is a Caucasian female of the stated age who is  wearing a hearing aid in the right ear.  LUNGS:  Clear to auscultation bilaterally.  HEART:  S1 and S2 with a regular rate and rhythm.  There is no evidence  of a murmurs, rubs, or gallops.  ABDOMEN:  Soft and nontender and without evidence of hepatosplenomegaly  or organomegaly.  PELVIC:  Normal external genitalia and urethra.  There is a third degree  cystocele and a second degree rectocele.  There appears to be good  vaginal  apical support.  The vaginal length is shortened consistent with  prior surgical therapy.  There is no evidence of a cervix.  There are no  palpable midline nor adnexal masses appreciated.   IMPRESSION:  The patient is a 65 year old para 2 female with genuine  stress incontinence and incomplete vaginal prolapse.   PLAN:  The patient will undergo an anterior and posterior colporrhaphy  with a Monarch transobturator sling and cystoscopy on August 07, 2007,  at [facility].  Risks, benefits, and alternatives have been  discussed with the patient who wishes to proceed.      Randye Lobo, M.D.  Electronically Signed     BES/MEDQ  D:  08/06/2007  T:  08/06/2007  Job:  [id number]

## 2011-04-12 NOTE — Assessment & Plan Note (Signed)
 [facility]                             PULMONARY OFFICE NOTE   NAME:[name], [name]                     MRN:          [id number]  DATE:06/13/2007                            DOB:          [birth year]    PULMONARY/FOLLOW UP OFFICE VISIT:   HISTORY:  A 65 year old white female who carries a diagnosis of sarcoid  but is mainly bothered by symptoms attributed to asthma, for which he  has been on Symbicort 80/4.5 two puffs b.i.d. but has found since the  hot weather her symptoms are not as well controlled, and she is having  to use more albuterol.  She is not using the Symbicort first thing in  the morning as recommended, but waits until she stirs around and gets  short of breath and then expecting it to work immediately.  She denies  any exertional chest pain, orthopnea, PND or leg swelling or associated  sinus or reflux symptoms.   MEDICATIONS:  For a full review of medications, please see face sheet  dated June 13, 2007.   PHYSICAL EXAMINATION:  GENERAL:  She is a pleasant, ambulatory white  female in no acute distress.  VITAL SIGNS:  Stable.  HEENT:  Unremarkable.  Oropharynx clear.  LUNGS:  Lung fields are clear bilaterally to auscultation and  percussion.  HEART:  Regular rate and rhythm without murmur, gallop, or rub.  ABDOMEN:  Soft, benign.  EXTREMITIES:  Warm without calf tenderness.  No clubbing, cyanosis, or  edema.   PFT's were reviewed with the patient from January of 2008 and indicate  an FEV1 of 69% predicted with 13% improvement after bronchodilators.   IMPRESSION:  She has chronic asthma that is probably related to  remodeling from either chronic airways inflammation from primary asthma  or sarcoid.  In either case, I believe she might benefit from more  aggressive bronchodilator therapy and recommended switching to Symbicort  160/4.5 two puffs b.i.d., but spent extra time teaching her how to use  this effectively today.  I have  emphasized that she should use is first  thing in the morning instead of waiting until she gets symptomatic, and  then supplement the Symbicort with the Xopenex up to 2 puffs every 4  hours as directed on the medication calendar she has been provided.   I then went over each of my recommendations in writing with her to make  sure we were all reading from the same page, both in terms of the  maintenance and p.r.n.'s.  Will see her back in 3 months for a repeat  set of PFT's.     [name]. [name] Sires, MD, [facility]  Electronically Signed    MBW/MedQ  DD: 06/13/2007  DT: 06/14/2007  Job #: [id number]   cc:   [name], M.D.

## 2011-04-15 NOTE — Assessment & Plan Note (Signed)
[facility]                             PULMONARY OFFICE NOTE   NAME:[name], [name]                     MRN:          [id number]  DATE:01/12/2007                            DOB:          Feb 16, 1946    HISTORY OF PRESENT ILLNESS:  The patient is a 65 year old white female  patient of Dr. Sherene Sires who has a known history of sarcoid, who presents for  a 9-month followup.  Last visit, the patient had complained that she was  having worsening shortness of breath.  She was restarted back on  Symbicort 80/4.5 two puffs twice daily.  Since last visit, the patient  reports she is much improved with decreased shortness of breath.  The  patient was supposed to have brought her medications in today for  review.  However, she only brought a list in today.  We have reviewed  that list and patient education was provided.  The patient denies any  chest pain, orthopnea, PND, or leg swelling.   PAST MEDICAL HISTORY:  Reviewed.   CURRENT MEDICATIONS:  Reviewed.   PHYSICAL EXAM:  The patient is a pleasant female in no acute distress.  She is afebrile with stable vital signs.  O2 saturation is 98% on room  air.  HEENT:  Unremarkable.  NECK:  Supple without adenopathy.  No JVD.  LUNGS:  Sounds are diminished at the bases.  Otherwise, clear.  CARDIAC:  Regular rate and rhythm.  ABDOMEN:  Soft and benign.  EXTREMITIES:  Warm without any edema.   IMPRESSION AND PLAN:  1. Asymptomatic sarcoid with some moderate airflow obstruction on      PFTs.  The patient is much improved on Symbicort 80/4.5 twice      daily.  The patient is to continue on her present regimen and      follow back up with Dr. Sherene Sires in 6 to 8 weeks or sooner if needed.  2. Complex medication regimen.  The patient's medication list was      reviewed today.  Patient education was provided.  3. Gastroesophageal reflux.  The patient will continue on Nexium twice      daily along with antireflux preventative  measures.     Rubye Oaks, NP  Electronically Signed      [name]. Sherene Sires, MD, [facility]  Electronically Signed   TP/MedQ  DD: 01/16/2007  DT: 01/16/2007  Job #: [id number]

## 2011-04-15 NOTE — Assessment & Plan Note (Signed)
[facility]                             PULMONARY OFFICE NOTE   NAME:[name], [name]                     MRN:          [id number]  DATE:11/17/2006                            DOB:          Jan 29, 1946    HISTORY OF PRESENT ILLNESS:  The patient is a 65 year old white female,  a patient of Dr. Sherene Sires, who presents for a 2-week followup.  The patient  was recently seen with a known history of sarcoid diagnosed by  transbronchial biopsy in 2002.  Last visit, the patient had complained  of a 10-year history of migratory chest pain and intermittent wheezing.  The patient was felt to have some upper airway instability secondary to  possible reflux and also irritable bowel syndrome.  The patient was  placed on Citrucel twice daily along with Zegerid at bedtime.  The  patient returns today and reports that her reflux has worsened since  being on Zegerid and wishes to switch back to Prilosec.  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  The patient does  complain over the last 2 weeks that she has developed increased nasal  congestion, postnasal drip, increased cough and wheezing.  The patient  denies any hemoptysis, purulent sputum, fevers, orthopnea, or exertional  chest pain, or radiating chest pains.  The patient also was having some  increased weakness with a recently increased Diovan dose and  subsequently has returned back to Diovan 80 mg and weakness episodes  have resolved.   PAST MEDICAL HISTORY:  Reviewed.   CURRENT MEDICATIONS:  Reviewed.   PHYSICAL EXAMINATION:  GENERAL:  The patient is a pleasant female in no  acute distress.  VITAL SIGNS:  She is afebrile with stable vital signs.  O2 saturation is  99% on room air.  HEENT:  Nasal mucosa is pale.  Nontender sinuses.  Posterior  pharynx is  clear.  TMs are normal.  NECK:  Supple without adenopathy.  RESPIRATORY:  Lung sounds reveal expiratory wheezes bilaterally.  CARDIAC:  Regular rate and rhythm.  ABDOMEN:  Soft and benign.  EXTREMITIES:  Warm without any edema.   The patient was given a Xopenex nebulizer treatment in the office.   IMPRESSION:  Upper respiratory infection with rhinitis flare.   PLAN:  The patient is to begin a prednisone taper over the next week.  Add Mucinex DM twice daily.  The patient is to begin Claritin daily.  Veramyst 2 puffs twice daily.  She will return here in 2 weeks as  scheduled for a full set of PFTs and follow up with Dr. Sherene Sires or sooner  as-needed.      Rubye Oaks, NP  Electronically Signed      [name]. Sherene Sires, MD, [facility]  Electronically Signed   TP/MedQ  DD: 11/17/2006  DT: 11/17/2006  Job #: [id number]

## 2011-04-15 NOTE — Assessment & Plan Note (Signed)
[facility]                             PULMONARY OFFICE NOTE   NAME:Ligman, GEORGE ALCANTAR                     MRN:          [id number]  DATE:12/15/2006                            DOB:          16-Apr-1946    HISTORY OF PRESENT ILLNESS:  A 65 year old white female, never smoker,  with a remote history of sarcoid seen on December 7 for evaluation of  chest pain that I thought was abdominal in nature and probably irritable  bowel for which she carried the diagnosis for GERD for which she was  diagnosed with positive EGD in June 1995.  She did not remember the  specific instructions that I gave her on how to take Zegerid.  Luckily,  I had kept a copy of what I had given her and reviewed it with her today  and found that she really was not consistent about any of the  instructions that I gave her.  However, when she saw my nurse  practitioner, she reported that her abdominal chest pain had resolved,  but now she is having worsening dyspnea that may have occurred after  stopping Symbicort.  She returns today, therefore, requesting PFT's with  multiple complaints, but the greatest one being that she has dyspnea  with activity that is worse than baseline.   MEDICATIONS:  1. Prilosec, when she remembers to take it at least before breakfast      but not always before supper.  2. Diovan.  3. Trazodone.  4. Intermittently she takes Citracal as recommended.   She has found that she needs to take increased albuterol since stopping  Symbicort.   REVIEW OF SYSTEMS:  She denies having any fevers, chills, sweats,  significant excessive sputum production nocturnally, respiratory  complaints, mild arthralgias or leg swelling or unintended weight loss  or ocular complaints.   PHYSICAL EXAMINATION:  GENERAL:  She is a depressed appearing but  immaculately dressed ambulatory elderly white female in no acute  distress.  VITAL SIGNS:  Stable.  HEENT:  Minimal turbinate.   Oropharynx is clear.  NECK:  Supple without cervical adenopathy or tenderness.  Trachea is  midline.  LUNGS:  Lung fields clear bilaterally to auscultation and percussion.  Chest exam was done after receiving bronchodilators in the PFT lab.  HEART:  Regular rhythm without murmurs, gallops, rubs.  ABDOMEN:  Soft, benign.  EXTREMITIES:  Warm without calf tenderness, cyanosis, clubbing or edema.   STUDIES:  PFT's reviewed with the patient indicate definite air flow  obstruction that is moderate in nature with a 13% improvement after  bronchodilators in a normal diffusing capacity.   Chest x-rays reviewed from November 03, 2006, showed COPD only with no  evidence of adenopathy or interstitial disease.   IMPRESSION:  Extended discussion with this patient lasted almost 30  minutes today regarding the following issues:  1. First, it appears that we have established by trial and error that      the Citrucel has helped her chronic abdominal and chest pain, and      that this has nothing to do with sarcoid.  I emphasized the      importance of taking the Citrucel perfectly regularly and then      following up with Dr. Lina Sar if not satisfied with this      approach since Dr. Juanda Chance is her GI physician on record.  2. Secondly, by chart review, I found that she does indeed have reflux      as indicated by EGD June 1995 which may contribute to airways      disease, but also some of her upper chest and abdominal pain, and I      have recommended that if she continues to have the pain, regardless      of what the source is, she should see Dr. Lina Sar for further      evaluation.  3. In terms of her dyspnea with exertion, she does definitely have      airflow obstruction that is partially reversible that worsened off      of Symbicort.  I recommended, therefore, that she restart Symbicort      at 80/4.5 two puffs b.i.d. and spend extra time teaching her how to      use it properly.  Her  previous concern with the Symbicort may have      bothered her throat, and in the higher doses, that certainly      could be the case, but she should be able to tolerate the lower      doses with the goal of optimizing the lung function and minimizing      her need for rescue therapy with Albuterol.   I have recommended she return in four weeks for full medication  reconciliation and provide [place] with a list of medications from her  formulary so that we can help meet her needs in terms of financial  concerns with all the different medications she is maintained on.     [name]. Sherene Sires, MD, [facility]  Electronically Signed    MBW/MedQ  DD: 12/15/2006  DT: 12/15/2006  Job #: [id number]   cc:   Lucila Maine

## 2011-04-15 NOTE — Assessment & Plan Note (Signed)
[facility]                             PULMONARY OFFICE NOTE   NAME:[name], [name]                     MRN:          [id number]  DATE:11/03/2006                            DOB:          [birth year]    PULMONARY/NEW PATIENT EVALUATION   CHIEF COMPLAINT:  Chest pain.   HISTORY:  This is a 65 year old white female with evidence of sarcoid by  transbronchial biopsy in 2002, but who has had a greater than 10 year  history of chest pain that previously was felt to be due to irritable  bowel syndrome.  She is here today because of her chest pain complaining  of pain that is occurring on the average of 5 times weekly, worse in the  last year, which waxes and wanes, but is always anterior, always left  greater than right, but does tend to migrate from side to side in terms  of where it is the worst.  It is present sitting, but almost never  with activity or in the supine position.  She denies any pleuritic  quality to the pain, fevers, chills, sweats, cough, or significant  dyspnea related to the pain.  She also denies any articular or ocular  complaints related to sarcoid, fevers, chills, sweats, or unintended  weight loss.   PAST MEDICAL HISTORY:  Significant for sarcoid, as outlined above, plus  hypertension.  Note, she had no airway involvement then.   ALLERGIES:  None known.   MEDICATIONS:  Prilosec, Diovan, Symbicort (which she says did not help  her), and nefazodone.  For full inventory please see face sheet column  dated November 03, 2006.   SOCIAL HISTORY:  She has never smoked.  She has worked for the [employer].   FAMILY HISTORY:  Recorded in detail, significant for the absence of  respiratory disease or sarcoid.   REVIEW OF SYSTEMS:  Taken in detail on the worksheet and negative,  except as outlined above.  She does have nocturnal wheeze, for which  she was prescribed Symbicort, but this has not helped.  Note that it  does not seem to actually keep her up at night or limit her activity  during the day.   PHYSICAL EXAMINATION:  This is a depressed-appearing ambulatory white  female with somewhat of a hopeless, helpless affect and attitude, and  anxious as well.  VITAL SIGNS:  Stable vital signs.  HEENT:  Unremarkable.  Oropharynx is clear.  Dentition intact.  NECK:  Supple without cervical adenopathy or tenderness. Trachea is  midline.  No thyromegaly.  LUNGS:  Fields perfectly clear bilaterally to auscultation and  percussion with no adventitious breath sounds or cough elicited on  inspiratory or expiratory maneuvers.  CARDIAC:  Regular rate and rhythm without murmur, gallop, or rub.  ABDOMEN:  Soft and benign with no palpable hepatosplenomegaly,  tenderness, guarding, or rebound.  EXTREMITIES:  Warm without calf tenderness, cyanosis, clubbing, or  edema.   No recent x-rays were available.  Chest x-ray shows no significant  adenopathy or infiltrates.   IMPRESSION:  1. This patient appears to have asymptomatic  sarcoidosis, which would      have nothing to do with her present migratory chest pain syndrome.  2. She does also carry a history of irritable bowel syndrome and has      all the classic features (anterior pain, left greater than right,      always lying, never supine or asleep).  At this point, I am      recommending that she take Citrucel 1 teaspoon b.i.d. perfectly      regularly to see what effect it has on the pain, and also      empirically restart her on Zegerid 40 mg at bedtime to see if it      knocks out her nocturnal wheezing.  I would also like to see her      back in 6 weeks to finish her workup for possible      sarcoidosis/airways involvement with a PFT.     [name]. Sherene Sires, MD, [facility]  Electronically Signed    MBW/MedQ  DD: 11/05/2006  DT: 11/05/2006  Job #: [id number]   cc:   Dr. Lucila Maine

## 2011-04-15 NOTE — Op Note (Signed)
[facility]  Patient:    [name], [name]                     MRN: [id number] Proc. Date: 03/27/01 Adm. Date:  [id number] Attending:  Avie Echevaria CC:         Lucila Maine, M.D.   Operative Report  PROCEDURE:  Fiberoptic bronchoscopy with transbronchial biopsy of the right lower lobe.  PULMONOLOGIST:  [name]. Sherene Sires, M.D. [facility]  REFERRING PHYSICIAN:  Lucila Maine, M.D.  HISTORY AND INDICATIONS:  This a very nice 65 year old white female, never smoker, with vague chest pains leading to a CT scan that indicated bilateral hilar adenopathy and tiny nodules that are most numerous in the right lower lobe.  The differential diagnoses included neoplasm (including lymphoma) and sarcoid.  The vague chest discomfort was bothersome for malignancy.  However, the patient had no other evidence for malignancy, and, therefore, bronchoscopy is felt to be an appropriate initial step.  I discussed all the risks, benefits, and alternatives with her in the office in the context of comprehensive pulmonary consultation which was documented on March 22, 2001, in office records.  Please see this document for further information regarding the risks, indications, and alternatives expressed to the patient at that time.  She agreed to the procedure.  DESCRIPTION OF PROCEDURE:  The procedure was performed in the bronchoscopy suite using continuous monitoring by surface ECG and oximetry.  She maintained greater than 90% saturation throughout the procedure in sinus rhythm using a total of 50 mg IV Demerol and 5 mg IV Versed for adequate conscious sedation.  The right naris and oropharynx were liberally anesthetized with 10% lidocaine spray.  The right naris was additionally prepared with 2% lidocaine jelly. Using a standard flexible fiberoptic bronchoscope, the right naris was easily cannulated with good visualization of entire pharynx and larynx.  The cords moved normally,  and there were no apparent upper airway lesions.  Using additional 1% lidocaine as needed, the entire tracheobronchial tree was explored bilaterally with the following findings.  FINDINGS:  Trachea, carina, and bilateral airways were completely normal to the subsegmental level.  PROCEDURE:  Using a wedge position in the right lower lobe and several basilar segments, random transbronchial biopsies were done.  (The nodules seen on CT scan were microscopic and could not be seen on fluoroscopy.)  Adequate tissue was obtained x 6 with minimal bleeding and no evidence of alveolar hemorrhage or pneumothorax by fluoroscopy.  Followup chest x-rays are pending.  Additionally, the right middle lobe was gently lavaged with adequate return for cytology, AFB, and fungal stain cultures.  IMPRESSION:  Multiple tiny pulmonary nodules associated with hilar adenopathy, probably represents sarcoid versus other granulomatous process versus malignancy.  RECOMMENDATIONS:   Will await tissue diagnosis before considering the next step, likely to include mediastinoscopy and/or open lung biopsy. DD:  03/27/01 TD:  03/27/01 Job: [id number]

## 2011-04-15 NOTE — Assessment & Plan Note (Signed)
[facility]                           GASTROENTEROLOGY OFFICE NOTE   NAME:[name], [name]                     MRN:          [id number]  DATE:06/20/2006                            DOB:          27-Aug-1946    HISTORY OF PRESENT ILLNESS:  Ms. Valido is a 65 year old white female who  has irritable bowel syndrome and rectal incompetence.  We have seen her for  the past about eight years for diarrhea, constipation, and fecal urgency  with some incontinence.  Last colonoscopy six to seven years ago showed  decreased rectal tone, otherwise normal colon to terminal ileum.  She was  intermittently on Zelnorm, as well as on Lomotil.  For the past year or so,  she has been having accidents at home while going shopping or even at work,  especially in the morning when she gets up she might not be able to finish  breakfast and run to the bathroom.  She denies any rectal bleeding.   PHYSICAL EXAMINATION:  VITAL SIGNS:  Blood pressure 100/60, pulse 64, weight  140 pounds.  GENERAL:  Alert and oriented, in no distress.  LUNGS:  Clear to auscultation.  HEART:  Normal S1 and normal S2.  ABDOMEN:  Soft, mildly tender left lower quadrant, normoactive bowel sounds.  RECTAL:  Decreased rectal tone, no stool with a small amount of fecal  material which was heme-negative.   IMPRESSION:  A 65 year old white female with irritable bowel syndrome/rectal  incompetence and urgency.   PLAN:  1.  High fiber diet.  2.  Imodium AD q.h.s. or a.m.  3.  Glycerine suppositories may use to evacuate the rectum before going out.      She may not need colonoscopy for 10 years from the last colonoscopy in      April 2000.  If she passes blood or has some more diarrhea, I would      consider a repeat colonoscopy earlier with random biopsies to rule out      microscopic colitis.                                   Hedwig Morton. Juanda Chance, MD   DMB/MedQ  DD:  06/20/2006  DT:  06/20/2006  Job #:  [id number]   cc:   Lucila Maine, M.D.

## 2011-04-15 NOTE — Assessment & Plan Note (Signed)
[facility]                             PULMONARY OFFICE NOTE   NAME:[name], [name]                     MRN:          [id number]  DATE:03/13/2007                            DOB:          [birth year]    PULMONARY/EXTENDED OFFICE VISIT   HISTORY OF PRESENT ILLNESS:  A 65 year old white female who was supposed  to return for medication reconciliation on February 15 but failed to  bring any of her medicines in as requested.  She had been having  intermittent abdominal and chest pain that I thought was probably either  irritable bowel syndrome or reflux and was started on Symbicort 80/4.5 2  puffs b.i.d. for dyspnea that actually completely resolved.  However, in  the meantime she continues to have intermittent abdominal pain despite  taking Citrucel twice daily (she failed to take the PPI therapy because  her insurance company would not pay for Nexium, and it has found that in  the past omeprazole and Prevacid do nothing for her.)   However, overall she is doing better.  She denies any pleuritic pain,  fevers, chills, sweats, edema of her feet or her legs.   For full inventory of medications, as I understand them, please see  column dated March 13, 2007, but note that she is not consistent about  an organized approach to her medicines as discussed in previous notes.   PHYSICAL EXAMINATION:  GENERAL:  She is an ambulatory white female in no  acute distress.  VITAL SIGNS:  Stable.  HEENT:  Unremarkable.  Oropharynx clear.  LUNGS:  Completely clear bilaterally to auscultation and percussion.  HEART:  Regular rhythm without murmur, gallop or rub.  ABDOMEN:  Soft, benign.  EXTREMITIES:  Warm without any calf tenderness, cyanosis or clubbing.   MDI technique was reviewed and to my dismay is only about 30% at  baseline, but improved to 60%.   IMPRESSION:  Improving dyspnea despite poor MDI technique either shows  that the Symbicort is exquisitely  effective or there is a significant  placebo effect occurring here.  She does have evidence of air flow  obstruction that is partially reversible on December 15, 2006 with a  history of sarcoid and never smoked.  Therefore I believe she does have  asthma. We need to be sure she is using Symbicort effectively and  document MDI technique on each visit.   In terms of her chronic recurrent abdominal pain I would like her to  first try what I asked her to do which is take the Nexium perfectly  regularly and Citrucel perfectly regularly, and then schedule a visit  with Dr. Lina Sar for further GI evaluation.  We have made the phone  calls  necessary today to have her approved for Nexium in the short term, but  Dr. Juanda Chance feels she needs to be on Nexium long-term.  I will ask her to  refill the medication for the patient.     [name]. Sherene Sires, MD, [facility]  Electronically Signed    MBW/MedQ  DD: 03/14/2007  DT: 03/14/2007  Job #: [id number]  cc:   Lucila Maine, M.D.

## 2011-04-15 NOTE — Assessment & Plan Note (Signed)
[facility]                             PULMONARY OFFICE NOTE   NAME:[name] [name] GAFFIN                     MRN:          [id number]  DATE:03/27/2007                            DOB:          [birth year]    HISTORY OF PRESENT ILLNESS:  The patient is a 65 year old white female,  patient of Dr. Thurston Hole who has a known history of sarcoid.  The patient  returns today for a 2 week followup and medication review.  The last  visit the patient was much improved after being started on Symbicort 2  puffs twice daily with decreased shortness of breath.  The patient was  suspected to have underlying reflux and/or irritable bowel syndrome.  The patient was recommended to use Nexium twice daily and added Citrucel  daily. The patient reports she has much improved symptoms. Her abdominal  discomfort and reflux is much improved.  The patient denies any chest  pain, orthopnea, PND or leg swelling.   The patient's medications are reviewed and correct with our medication  list.   PAST MEDICAL HISTORY:  Is reviewed.   CURRENT MEDICATIONS:  Is reviewed.   PHYSICAL EXAMINATION:  The patient is a pleasant female in no acute  distress.  She is afebrile with stable vital signs.  O2 saturation is 96% on room  air.  HEENT:  Is unremarkable.  NECK:  Is supple without adenopathy.  No JVD.  LUNGS:  Sounds are clear.  CARDIAC:  Regular rate.  ABDOMEN:  Is soft and nontender.  EXTREMITIES:  Are warm without any calf tenderness, cyanosis, clubbing  or edema.   IMPRESSION AND PLAN:  1. Asymptomatic sarcoid with some moderate air flow obstructions on      pulmonary function tests.  The patient is much improved on      Symbicort and will continue on her present regimen.  Follow back up      with Dr. Sherene Sires in 2 to 3 months.  2. Gastroesophageal reflux and probable irritable bowel syndrome, much      improved on Nexium twice daily and Citrucel daily.  Patient is to      continue on  a reflux preventative diet and follow up back in here      in 2 or 3 months.  The patient wants to hold off presently on      referral to gastroenterology at present.  We will evaluate at next      office visit and determine if patient needs to refer to      gastroenterology or not.  3. Complex medication regimen.  Patient medication reviewed in detail      with the patient.      Patient education provided.  A computerized medication calendar was      completed for this patient, reviewed in detail with patient,      education was provided.      Rubye Oaks, NP  Electronically Signed      [name]. Sherene Sires, MD, [facility]  Electronically Signed   TP/MedQ  DD: 03/28/2007  DT: 03/28/2007  Job #: [id number]

## 2011-04-15 NOTE — Op Note (Signed)
[place]  Patient:    [name], [name]                     MRN: [id number] Adm. Date:  [id number] Attending:  Cameron Proud                           Operative Report  PREOPERATIVE DIAGNOSIS:  Bilateral pulmonary nodules, mediastinal adenopathy.  POSTOPERATIVE DIAGNOSIS:  Bilateral pulmonary nodules, mediastinal adenopathy.  OPERATION PERFORMED:  Fiberoptic bronchoscopy and mediastinoscopy.  DESCRIPTION:  After adequate general anesthesia the patient was prepped and draped in the usual sterile manner.  A fiberoptic bronchoscope was passed through the endotracheal.  I might need to mention this patient had a micrognathia and was intubated without difficulty but would be almost impossible to intubate with a dual lumen tube because of her micrognathia and high-arched palate.  The fiberoptic bronchoscope was passed through the endotracheal tube.  The carina was in the midline.  The right upper lobe, right middle lobe, and right lower lobe orifices were normal.  The left main stem bronchus, left upper lobe, and left lower lobe orifices were normal.  All washings were taken for cultures and cytologies.  The fiberoptic bronchoscope was removed.  The patient was prepped and draped in the usual sterile manner. A transverse incision was made above the sternal notch and carried down with electrocautery through the subcutaneous tissue and fascia.  Pretracheal fascia was entered and biopsies of 4R and 4L nodes were done without difficulty.  The biopsies were then sent for cultures - part were sent for culture and the rest were permanent section.  Strap muscles were closed with 2-0 Vicryl, subcutaneous tissue with 3-0 Vicryl, and subcuticular with 3-0 Vicryl. Steri-Strips were applied.  The patient returned to the recovery room in stable condition. DD:  04/27/01 TD:  04/27/01 Job: [id number]

## 2011-04-15 NOTE — Discharge Summary (Signed)
[name], [name]              ACCOUNT NO.:  [id number]   MEDICAL RECORD NO.:  [id number]          PATIENT TYPE:  INP   LOCATION:  9313                          FACILITY:  [facility]   PHYSICIAN:  Randye Lobo, M.D.   DATE OF BIRTH:  Jun 04, 1946   DATE OF ADMISSION:  08/07/2007  DATE OF DISCHARGE:  08/08/2007                               DISCHARGE SUMMARY   ADMISSION DIAGNOSES:  1. Genuine stress incontinence.  2. Incomplete vaginal prolapse.   DISCHARGE DIAGNOSES:  1. Genuine stress incontinence.  2. Incomplete vaginal prolapse.  3. She is status post Monarch trans obturator suburethral sling,      cystoscopy, and anterior posterior colporrhaphy.  4. Postoperative urinary retention.   ADMISSION HISTORY AND PHYSICAL EXAMINATION:  The patient is a 65-year-  old para 2 Caucasian female, status post total vaginal hysterectomy in  2001 who presented with a complaint of urinary incontinence with  stressful maneuvers.  In the office, the patient was noted to have a  third-degree cystocele and a second-degree rectocele.  There was good  vaginal apical support.  The cervix was absent, and there were no  palpable midline nor adnexal masses.   Preoperative urodynamic studies documented genuine stress incontinence.   The patient wished for surgical treatment of her pelvic organ prolapse  and stress incontinence.   HOSPITAL COURSE:  The patient was admitted on August 07, 2007.  At  which time she underwent a Monarch trans obturator suburethral sling and  cystoscopy along with an anterior and posterior colporrhaphy under the  direction of Dr. Conley Simmonds and with the assistance of Dr. Lodema Hong  at the [facility].  The patient's surgery was  uncomplicated.   Postoperatively, the patient had a morphine PCA for pain control.  On  postoperative day #1, the PCA was discontinued, and the patient was  started on oral Percocet.  The patient's vaginal packing was  removed,  and she was started on voiding trials.  The patient was noted to have  postvoid residuals between 100 and 750.  Her Foley catheter was  therefore replaced and left to gravity drainage at the time of her  discharge.  The patient's diet was slowly advanced to normal which she  was tolerating well during the hospitalization.  The patient was able to  ambulate independently.  The patient had no significant fevers during  her hospitalization.  Her discharge hemoglobin was 11.6, and she was  tolerating this well.   The patient was found to be in good condition and ready for discharge on  postoperative day #1.   DISCHARGE INSTRUCTIONS:  1. Discharged to home.  2. The patient will take the following medications:  Percocet 5/325 mg      1 to 2 p.o. q 4 to 6 hours p.r.n. pain, ciprofloxacin 250 mg p.o.      b.i.d. x 5 days.  3. The patient will follow a regular diet.  4. The patient will have decreased activity at home for the next 6      weeks.  She will not lift anything over 10  pounds for the next 12      weeks.  She will not drive for 2 weeks.  5. The patient will follow up in the office in 5 days.  6. The patient will call if she experiences any problems with fever,      nausea and vomiting, pain uncontrolled by her medication, heavy      vaginal bleeding, difficulty with her catheter, or any other      concern.      Randye Lobo, M.D.  Electronically Signed     BES/MEDQ  D:  10/27/2007  T:  10/27/2007  Job:  [id number]

## 2011-05-24 ENCOUNTER — Encounter: Payer: Self-pay | Admitting: Cardiovascular Disease

## 2011-08-08 ENCOUNTER — Encounter: Payer: Self-pay | Admitting: Cardiovascular Disease

## 2011-09-09 LAB — COMPREHENSIVE METABOLIC PANEL
ALT: 13
Alkaline Phosphatase: 72
BUN: 17
Chloride: 101
Glucose, Bld: 101 — ABNORMAL HIGH
Potassium: 3.6
Sodium: 137
Total Bilirubin: 0.7

## 2011-09-09 LAB — CBC
HCT: 37.8
Hemoglobin: 13.2
MCHC: 34.8
Platelets: 201
RBC: 3.75 — ABNORMAL LOW
RBC: 4.28
RDW: 13.4
WBC: 9.9

## 2011-09-09 LAB — URINALYSIS, ROUTINE W REFLEX MICROSCOPIC
Ketones, ur: NEGATIVE
Leukocytes, UA: NEGATIVE
Nitrite: NEGATIVE
Specific Gravity, Urine: 1.01
pH: 7.5

## 2011-09-09 LAB — BASIC METABOLIC PANEL
BUN: 10
CO2: 29
Calcium: 7.8 — ABNORMAL LOW
Creatinine, Ser: 0.73
GFR calc Af Amer: >60

## 2011-12-02 DIAGNOSIS — H908 Mixed conductive and sensorineural hearing loss, unspecified: Secondary | ICD-10-CM | POA: Diagnosis not present

## 2011-12-07 DIAGNOSIS — E559 Vitamin D deficiency, unspecified: Secondary | ICD-10-CM | POA: Diagnosis not present

## 2011-12-07 DIAGNOSIS — M810 Age-related osteoporosis without current pathological fracture: Secondary | ICD-10-CM | POA: Diagnosis not present

## 2011-12-07 DIAGNOSIS — Z79899 Other long term (current) drug therapy: Secondary | ICD-10-CM | POA: Diagnosis not present

## 2011-12-07 DIAGNOSIS — E782 Mixed hyperlipidemia: Secondary | ICD-10-CM | POA: Diagnosis not present

## 2011-12-09 DIAGNOSIS — M6281 Muscle weakness (generalized): Secondary | ICD-10-CM | POA: Diagnosis not present

## 2011-12-09 DIAGNOSIS — Z9181 History of falling: Secondary | ICD-10-CM | POA: Diagnosis not present

## 2011-12-09 DIAGNOSIS — R279 Unspecified lack of coordination: Secondary | ICD-10-CM | POA: Diagnosis not present

## 2011-12-09 DIAGNOSIS — R269 Unspecified abnormalities of gait and mobility: Secondary | ICD-10-CM | POA: Diagnosis not present

## 2011-12-09 DIAGNOSIS — R293 Abnormal posture: Secondary | ICD-10-CM | POA: Diagnosis not present

## 2011-12-09 DIAGNOSIS — IMO0001 Reserved for inherently not codable concepts without codable children: Secondary | ICD-10-CM | POA: Diagnosis not present

## 2011-12-12 DIAGNOSIS — R269 Unspecified abnormalities of gait and mobility: Secondary | ICD-10-CM | POA: Diagnosis not present

## 2011-12-12 DIAGNOSIS — IMO0001 Reserved for inherently not codable concepts without codable children: Secondary | ICD-10-CM | POA: Diagnosis not present

## 2011-12-12 DIAGNOSIS — R279 Unspecified lack of coordination: Secondary | ICD-10-CM | POA: Diagnosis not present

## 2011-12-12 DIAGNOSIS — Z9181 History of falling: Secondary | ICD-10-CM | POA: Diagnosis not present

## 2011-12-12 DIAGNOSIS — R293 Abnormal posture: Secondary | ICD-10-CM | POA: Diagnosis not present

## 2011-12-12 DIAGNOSIS — M6281 Muscle weakness (generalized): Secondary | ICD-10-CM | POA: Diagnosis not present

## 2011-12-14 DIAGNOSIS — R269 Unspecified abnormalities of gait and mobility: Secondary | ICD-10-CM | POA: Diagnosis not present

## 2011-12-14 DIAGNOSIS — M6281 Muscle weakness (generalized): Secondary | ICD-10-CM | POA: Diagnosis not present

## 2011-12-14 DIAGNOSIS — R279 Unspecified lack of coordination: Secondary | ICD-10-CM | POA: Diagnosis not present

## 2011-12-14 DIAGNOSIS — R293 Abnormal posture: Secondary | ICD-10-CM | POA: Diagnosis not present

## 2011-12-14 DIAGNOSIS — Z9181 History of falling: Secondary | ICD-10-CM | POA: Diagnosis not present

## 2011-12-14 DIAGNOSIS — IMO0001 Reserved for inherently not codable concepts without codable children: Secondary | ICD-10-CM | POA: Diagnosis not present

## 2011-12-19 DIAGNOSIS — R293 Abnormal posture: Secondary | ICD-10-CM | POA: Diagnosis not present

## 2011-12-19 DIAGNOSIS — IMO0001 Reserved for inherently not codable concepts without codable children: Secondary | ICD-10-CM | POA: Diagnosis not present

## 2011-12-19 DIAGNOSIS — R269 Unspecified abnormalities of gait and mobility: Secondary | ICD-10-CM | POA: Diagnosis not present

## 2011-12-19 DIAGNOSIS — M6281 Muscle weakness (generalized): Secondary | ICD-10-CM | POA: Diagnosis not present

## 2011-12-19 DIAGNOSIS — Z9181 History of falling: Secondary | ICD-10-CM | POA: Diagnosis not present

## 2011-12-19 DIAGNOSIS — R279 Unspecified lack of coordination: Secondary | ICD-10-CM | POA: Diagnosis not present

## 2011-12-30 DIAGNOSIS — IMO0001 Reserved for inherently not codable concepts without codable children: Secondary | ICD-10-CM | POA: Diagnosis not present

## 2011-12-30 DIAGNOSIS — R279 Unspecified lack of coordination: Secondary | ICD-10-CM | POA: Diagnosis not present

## 2011-12-30 DIAGNOSIS — M6281 Muscle weakness (generalized): Secondary | ICD-10-CM | POA: Diagnosis not present

## 2011-12-30 DIAGNOSIS — R293 Abnormal posture: Secondary | ICD-10-CM | POA: Diagnosis not present

## 2011-12-30 DIAGNOSIS — Z9181 History of falling: Secondary | ICD-10-CM | POA: Diagnosis not present

## 2011-12-30 DIAGNOSIS — R269 Unspecified abnormalities of gait and mobility: Secondary | ICD-10-CM | POA: Diagnosis not present

## 2012-01-04 DIAGNOSIS — R269 Unspecified abnormalities of gait and mobility: Secondary | ICD-10-CM | POA: Diagnosis not present

## 2012-01-04 DIAGNOSIS — Z9181 History of falling: Secondary | ICD-10-CM | POA: Diagnosis not present

## 2012-01-04 DIAGNOSIS — R279 Unspecified lack of coordination: Secondary | ICD-10-CM | POA: Diagnosis not present

## 2012-01-04 DIAGNOSIS — R293 Abnormal posture: Secondary | ICD-10-CM | POA: Diagnosis not present

## 2012-01-04 DIAGNOSIS — M6281 Muscle weakness (generalized): Secondary | ICD-10-CM | POA: Diagnosis not present

## 2012-01-04 DIAGNOSIS — IMO0001 Reserved for inherently not codable concepts without codable children: Secondary | ICD-10-CM | POA: Diagnosis not present

## 2012-01-05 DIAGNOSIS — Z008 Encounter for other general examination: Secondary | ICD-10-CM | POA: Diagnosis not present

## 2012-01-06 DIAGNOSIS — Z9181 History of falling: Secondary | ICD-10-CM | POA: Diagnosis not present

## 2012-01-06 DIAGNOSIS — R293 Abnormal posture: Secondary | ICD-10-CM | POA: Diagnosis not present

## 2012-01-06 DIAGNOSIS — M6281 Muscle weakness (generalized): Secondary | ICD-10-CM | POA: Diagnosis not present

## 2012-01-06 DIAGNOSIS — R279 Unspecified lack of coordination: Secondary | ICD-10-CM | POA: Diagnosis not present

## 2012-01-06 DIAGNOSIS — R269 Unspecified abnormalities of gait and mobility: Secondary | ICD-10-CM | POA: Diagnosis not present

## 2012-01-06 DIAGNOSIS — IMO0001 Reserved for inherently not codable concepts without codable children: Secondary | ICD-10-CM | POA: Diagnosis not present

## 2012-01-09 DIAGNOSIS — Z9181 History of falling: Secondary | ICD-10-CM | POA: Diagnosis not present

## 2012-01-09 DIAGNOSIS — R293 Abnormal posture: Secondary | ICD-10-CM | POA: Diagnosis not present

## 2012-01-09 DIAGNOSIS — R279 Unspecified lack of coordination: Secondary | ICD-10-CM | POA: Diagnosis not present

## 2012-01-09 DIAGNOSIS — M6281 Muscle weakness (generalized): Secondary | ICD-10-CM | POA: Diagnosis not present

## 2012-01-09 DIAGNOSIS — IMO0001 Reserved for inherently not codable concepts without codable children: Secondary | ICD-10-CM | POA: Diagnosis not present

## 2012-01-09 DIAGNOSIS — R269 Unspecified abnormalities of gait and mobility: Secondary | ICD-10-CM | POA: Diagnosis not present

## 2012-01-09 DIAGNOSIS — Z1211 Encounter for screening for malignant neoplasm of colon: Secondary | ICD-10-CM | POA: Diagnosis not present

## 2012-01-11 DIAGNOSIS — R279 Unspecified lack of coordination: Secondary | ICD-10-CM | POA: Diagnosis not present

## 2012-01-11 DIAGNOSIS — M6281 Muscle weakness (generalized): Secondary | ICD-10-CM | POA: Diagnosis not present

## 2012-01-11 DIAGNOSIS — Z9181 History of falling: Secondary | ICD-10-CM | POA: Diagnosis not present

## 2012-01-11 DIAGNOSIS — R293 Abnormal posture: Secondary | ICD-10-CM | POA: Diagnosis not present

## 2012-01-11 DIAGNOSIS — IMO0001 Reserved for inherently not codable concepts without codable children: Secondary | ICD-10-CM | POA: Diagnosis not present

## 2012-01-11 DIAGNOSIS — M810 Age-related osteoporosis without current pathological fracture: Secondary | ICD-10-CM | POA: Diagnosis not present

## 2012-01-11 DIAGNOSIS — R269 Unspecified abnormalities of gait and mobility: Secondary | ICD-10-CM | POA: Diagnosis not present

## 2012-01-11 DIAGNOSIS — E119 Type 2 diabetes mellitus without complications: Secondary | ICD-10-CM | POA: Diagnosis not present

## 2012-01-11 DIAGNOSIS — E782 Mixed hyperlipidemia: Secondary | ICD-10-CM | POA: Diagnosis not present

## 2012-01-13 DIAGNOSIS — Z79899 Other long term (current) drug therapy: Secondary | ICD-10-CM | POA: Diagnosis not present

## 2012-01-13 DIAGNOSIS — E782 Mixed hyperlipidemia: Secondary | ICD-10-CM | POA: Diagnosis not present

## 2012-01-17 DIAGNOSIS — R269 Unspecified abnormalities of gait and mobility: Secondary | ICD-10-CM | POA: Diagnosis not present

## 2012-01-17 DIAGNOSIS — R279 Unspecified lack of coordination: Secondary | ICD-10-CM | POA: Diagnosis not present

## 2012-01-17 DIAGNOSIS — R293 Abnormal posture: Secondary | ICD-10-CM | POA: Diagnosis not present

## 2012-01-17 DIAGNOSIS — M6281 Muscle weakness (generalized): Secondary | ICD-10-CM | POA: Diagnosis not present

## 2012-01-17 DIAGNOSIS — IMO0001 Reserved for inherently not codable concepts without codable children: Secondary | ICD-10-CM | POA: Diagnosis not present

## 2012-01-17 DIAGNOSIS — Z9181 History of falling: Secondary | ICD-10-CM | POA: Diagnosis not present

## 2012-01-19 DIAGNOSIS — Z9181 History of falling: Secondary | ICD-10-CM | POA: Diagnosis not present

## 2012-01-19 DIAGNOSIS — IMO0001 Reserved for inherently not codable concepts without codable children: Secondary | ICD-10-CM | POA: Diagnosis not present

## 2012-01-19 DIAGNOSIS — M6281 Muscle weakness (generalized): Secondary | ICD-10-CM | POA: Diagnosis not present

## 2012-01-19 DIAGNOSIS — R269 Unspecified abnormalities of gait and mobility: Secondary | ICD-10-CM | POA: Diagnosis not present

## 2012-01-19 DIAGNOSIS — R279 Unspecified lack of coordination: Secondary | ICD-10-CM | POA: Diagnosis not present

## 2012-01-19 DIAGNOSIS — R293 Abnormal posture: Secondary | ICD-10-CM | POA: Diagnosis not present

## 2012-01-20 DIAGNOSIS — E119 Type 2 diabetes mellitus without complications: Secondary | ICD-10-CM | POA: Diagnosis not present

## 2012-02-02 DIAGNOSIS — J309 Allergic rhinitis, unspecified: Secondary | ICD-10-CM | POA: Diagnosis not present

## 2012-02-02 DIAGNOSIS — J45909 Unspecified asthma, uncomplicated: Secondary | ICD-10-CM | POA: Diagnosis not present

## 2012-02-06 DIAGNOSIS — M810 Age-related osteoporosis without current pathological fracture: Secondary | ICD-10-CM | POA: Diagnosis not present

## 2012-02-15 DIAGNOSIS — J45909 Unspecified asthma, uncomplicated: Secondary | ICD-10-CM | POA: Diagnosis not present

## 2012-02-16 DIAGNOSIS — J45909 Unspecified asthma, uncomplicated: Secondary | ICD-10-CM | POA: Diagnosis not present

## 2012-02-20 DIAGNOSIS — Z1231 Encounter for screening mammogram for malignant neoplasm of breast: Secondary | ICD-10-CM | POA: Diagnosis not present

## 2012-02-27 DIAGNOSIS — J45909 Unspecified asthma, uncomplicated: Secondary | ICD-10-CM | POA: Diagnosis not present

## 2012-03-02 DIAGNOSIS — J45901 Unspecified asthma with (acute) exacerbation: Secondary | ICD-10-CM | POA: Diagnosis not present

## 2012-03-14 DIAGNOSIS — J45909 Unspecified asthma, uncomplicated: Secondary | ICD-10-CM | POA: Diagnosis not present

## 2012-03-15 DIAGNOSIS — J45909 Unspecified asthma, uncomplicated: Secondary | ICD-10-CM | POA: Diagnosis not present

## 2012-04-11 DIAGNOSIS — J45909 Unspecified asthma, uncomplicated: Secondary | ICD-10-CM | POA: Diagnosis not present

## 2012-04-13 DIAGNOSIS — J45909 Unspecified asthma, uncomplicated: Secondary | ICD-10-CM | POA: Diagnosis not present

## 2012-05-09 DIAGNOSIS — E119 Type 2 diabetes mellitus without complications: Secondary | ICD-10-CM | POA: Diagnosis not present

## 2012-05-09 DIAGNOSIS — J45909 Unspecified asthma, uncomplicated: Secondary | ICD-10-CM | POA: Diagnosis not present

## 2012-05-09 DIAGNOSIS — E559 Vitamin D deficiency, unspecified: Secondary | ICD-10-CM | POA: Diagnosis not present

## 2012-05-10 DIAGNOSIS — J45909 Unspecified asthma, uncomplicated: Secondary | ICD-10-CM | POA: Diagnosis not present

## 2012-05-17 DIAGNOSIS — H04129 Dry eye syndrome of unspecified lacrimal gland: Secondary | ICD-10-CM | POA: Diagnosis not present

## 2012-05-17 DIAGNOSIS — E119 Type 2 diabetes mellitus without complications: Secondary | ICD-10-CM | POA: Diagnosis not present

## 2012-05-17 DIAGNOSIS — Z961 Presence of intraocular lens: Secondary | ICD-10-CM | POA: Diagnosis not present

## 2012-05-17 DIAGNOSIS — H43819 Vitreous degeneration, unspecified eye: Secondary | ICD-10-CM | POA: Diagnosis not present

## 2012-06-14 DIAGNOSIS — J45909 Unspecified asthma, uncomplicated: Secondary | ICD-10-CM | POA: Diagnosis not present

## 2012-06-15 DIAGNOSIS — J45909 Unspecified asthma, uncomplicated: Secondary | ICD-10-CM | POA: Diagnosis not present

## 2012-07-09 DIAGNOSIS — M2040 Other hammer toe(s) (acquired), unspecified foot: Secondary | ICD-10-CM | POA: Diagnosis not present

## 2012-07-09 DIAGNOSIS — E119 Type 2 diabetes mellitus without complications: Secondary | ICD-10-CM | POA: Diagnosis not present

## 2012-07-11 DIAGNOSIS — K219 Gastro-esophageal reflux disease without esophagitis: Secondary | ICD-10-CM | POA: Diagnosis not present

## 2012-07-11 DIAGNOSIS — G4710 Hypersomnia, unspecified: Secondary | ICD-10-CM | POA: Diagnosis not present

## 2012-07-11 DIAGNOSIS — G4730 Sleep apnea, unspecified: Secondary | ICD-10-CM | POA: Diagnosis not present

## 2012-07-11 DIAGNOSIS — J45909 Unspecified asthma, uncomplicated: Secondary | ICD-10-CM | POA: Diagnosis not present

## 2012-07-11 DIAGNOSIS — R0982 Postnasal drip: Secondary | ICD-10-CM | POA: Diagnosis not present

## 2012-07-12 DIAGNOSIS — J45909 Unspecified asthma, uncomplicated: Secondary | ICD-10-CM | POA: Diagnosis not present

## 2012-07-13 DIAGNOSIS — J45909 Unspecified asthma, uncomplicated: Secondary | ICD-10-CM | POA: Diagnosis not present

## 2012-07-16 DIAGNOSIS — D869 Sarcoidosis, unspecified: Secondary | ICD-10-CM | POA: Diagnosis not present

## 2012-07-16 DIAGNOSIS — K449 Diaphragmatic hernia without obstruction or gangrene: Secondary | ICD-10-CM | POA: Diagnosis not present

## 2012-07-18 DIAGNOSIS — G4710 Hypersomnia, unspecified: Secondary | ICD-10-CM | POA: Diagnosis not present

## 2012-07-31 DIAGNOSIS — G4710 Hypersomnia, unspecified: Secondary | ICD-10-CM | POA: Diagnosis not present

## 2012-07-31 DIAGNOSIS — J45909 Unspecified asthma, uncomplicated: Secondary | ICD-10-CM | POA: Diagnosis not present

## 2012-07-31 DIAGNOSIS — G4730 Sleep apnea, unspecified: Secondary | ICD-10-CM | POA: Diagnosis not present

## 2012-07-31 DIAGNOSIS — R0982 Postnasal drip: Secondary | ICD-10-CM | POA: Diagnosis not present

## 2012-07-31 DIAGNOSIS — K219 Gastro-esophageal reflux disease without esophagitis: Secondary | ICD-10-CM | POA: Diagnosis not present

## 2012-08-09 DIAGNOSIS — E119 Type 2 diabetes mellitus without complications: Secondary | ICD-10-CM | POA: Diagnosis not present

## 2012-08-09 DIAGNOSIS — Z23 Encounter for immunization: Secondary | ICD-10-CM | POA: Diagnosis not present

## 2012-08-09 DIAGNOSIS — E782 Mixed hyperlipidemia: Secondary | ICD-10-CM | POA: Diagnosis not present

## 2012-08-16 DIAGNOSIS — J45909 Unspecified asthma, uncomplicated: Secondary | ICD-10-CM | POA: Diagnosis not present

## 2012-08-17 DIAGNOSIS — J45909 Unspecified asthma, uncomplicated: Secondary | ICD-10-CM | POA: Diagnosis not present

## 2012-09-12 DIAGNOSIS — M79609 Pain in unspecified limb: Secondary | ICD-10-CM | POA: Diagnosis not present

## 2012-09-13 DIAGNOSIS — J45909 Unspecified asthma, uncomplicated: Secondary | ICD-10-CM | POA: Diagnosis not present

## 2012-09-14 DIAGNOSIS — J45909 Unspecified asthma, uncomplicated: Secondary | ICD-10-CM | POA: Diagnosis not present

## 2012-09-19 DIAGNOSIS — M949 Disorder of cartilage, unspecified: Secondary | ICD-10-CM | POA: Diagnosis not present

## 2012-09-19 DIAGNOSIS — M899 Disorder of bone, unspecified: Secondary | ICD-10-CM | POA: Diagnosis not present

## 2012-09-21 DIAGNOSIS — J45909 Unspecified asthma, uncomplicated: Secondary | ICD-10-CM | POA: Diagnosis not present

## 2012-09-21 DIAGNOSIS — K219 Gastro-esophageal reflux disease without esophagitis: Secondary | ICD-10-CM | POA: Diagnosis not present

## 2012-09-21 DIAGNOSIS — R0982 Postnasal drip: Secondary | ICD-10-CM | POA: Diagnosis not present

## 2012-09-21 DIAGNOSIS — G4730 Sleep apnea, unspecified: Secondary | ICD-10-CM | POA: Diagnosis not present

## 2012-09-21 DIAGNOSIS — G4710 Hypersomnia, unspecified: Secondary | ICD-10-CM | POA: Diagnosis not present

## 2012-09-24 DIAGNOSIS — K219 Gastro-esophageal reflux disease without esophagitis: Secondary | ICD-10-CM | POA: Diagnosis not present

## 2012-09-24 DIAGNOSIS — R0982 Postnasal drip: Secondary | ICD-10-CM | POA: Diagnosis not present

## 2012-09-24 DIAGNOSIS — G4730 Sleep apnea, unspecified: Secondary | ICD-10-CM | POA: Diagnosis not present

## 2012-09-24 DIAGNOSIS — J45909 Unspecified asthma, uncomplicated: Secondary | ICD-10-CM | POA: Diagnosis not present

## 2012-10-03 DIAGNOSIS — Z23 Encounter for immunization: Secondary | ICD-10-CM | POA: Diagnosis not present

## 2012-10-03 DIAGNOSIS — J45901 Unspecified asthma with (acute) exacerbation: Secondary | ICD-10-CM | POA: Diagnosis not present

## 2012-10-11 DIAGNOSIS — J45909 Unspecified asthma, uncomplicated: Secondary | ICD-10-CM | POA: Diagnosis not present

## 2012-10-12 DIAGNOSIS — J45909 Unspecified asthma, uncomplicated: Secondary | ICD-10-CM | POA: Diagnosis not present

## 2012-11-08 DIAGNOSIS — J45909 Unspecified asthma, uncomplicated: Secondary | ICD-10-CM | POA: Diagnosis not present

## 2012-11-09 DIAGNOSIS — J45909 Unspecified asthma, uncomplicated: Secondary | ICD-10-CM | POA: Diagnosis not present

## 2012-11-15 DIAGNOSIS — J45901 Unspecified asthma with (acute) exacerbation: Secondary | ICD-10-CM | POA: Diagnosis not present

## 2012-12-07 DIAGNOSIS — J45909 Unspecified asthma, uncomplicated: Secondary | ICD-10-CM | POA: Diagnosis not present

## 2012-12-12 DIAGNOSIS — J309 Allergic rhinitis, unspecified: Secondary | ICD-10-CM | POA: Diagnosis not present

## 2012-12-12 DIAGNOSIS — J45909 Unspecified asthma, uncomplicated: Secondary | ICD-10-CM | POA: Diagnosis not present

## 2013-01-02 DIAGNOSIS — J111 Influenza due to unidentified influenza virus with other respiratory manifestations: Secondary | ICD-10-CM | POA: Diagnosis not present

## 2013-01-07 DIAGNOSIS — J45909 Unspecified asthma, uncomplicated: Secondary | ICD-10-CM | POA: Diagnosis not present

## 2013-01-08 DIAGNOSIS — J45909 Unspecified asthma, uncomplicated: Secondary | ICD-10-CM | POA: Diagnosis not present

## 2013-01-23 DIAGNOSIS — E782 Mixed hyperlipidemia: Secondary | ICD-10-CM | POA: Diagnosis not present

## 2013-01-23 DIAGNOSIS — Z1231 Encounter for screening mammogram for malignant neoplasm of breast: Secondary | ICD-10-CM | POA: Diagnosis not present

## 2013-01-23 DIAGNOSIS — M79609 Pain in unspecified limb: Secondary | ICD-10-CM | POA: Diagnosis not present

## 2013-01-23 DIAGNOSIS — Z79899 Other long term (current) drug therapy: Secondary | ICD-10-CM | POA: Diagnosis not present

## 2013-01-23 DIAGNOSIS — Z0000 Encounter for general adult medical examination without abnormal findings: Secondary | ICD-10-CM | POA: Diagnosis not present

## 2013-01-23 DIAGNOSIS — G4730 Sleep apnea, unspecified: Secondary | ICD-10-CM | POA: Diagnosis not present

## 2013-01-24 DIAGNOSIS — E782 Mixed hyperlipidemia: Secondary | ICD-10-CM | POA: Diagnosis not present

## 2013-01-25 DIAGNOSIS — Z1211 Encounter for screening for malignant neoplasm of colon: Secondary | ICD-10-CM | POA: Diagnosis not present

## 2013-01-31 DIAGNOSIS — M47817 Spondylosis without myelopathy or radiculopathy, lumbosacral region: Secondary | ICD-10-CM | POA: Diagnosis not present

## 2013-02-06 DIAGNOSIS — M169 Osteoarthritis of hip, unspecified: Secondary | ICD-10-CM | POA: Diagnosis not present

## 2013-02-06 DIAGNOSIS — J45909 Unspecified asthma, uncomplicated: Secondary | ICD-10-CM | POA: Diagnosis not present

## 2013-02-06 DIAGNOSIS — M25559 Pain in unspecified hip: Secondary | ICD-10-CM | POA: Diagnosis not present

## 2013-02-07 DIAGNOSIS — J45909 Unspecified asthma, uncomplicated: Secondary | ICD-10-CM | POA: Diagnosis not present

## 2013-02-15 DIAGNOSIS — J45901 Unspecified asthma with (acute) exacerbation: Secondary | ICD-10-CM | POA: Diagnosis not present

## 2013-02-20 DIAGNOSIS — Z1231 Encounter for screening mammogram for malignant neoplasm of breast: Secondary | ICD-10-CM | POA: Diagnosis not present

## 2013-02-25 DIAGNOSIS — J45909 Unspecified asthma, uncomplicated: Secondary | ICD-10-CM | POA: Diagnosis not present

## 2013-03-05 DIAGNOSIS — J45909 Unspecified asthma, uncomplicated: Secondary | ICD-10-CM | POA: Diagnosis not present

## 2013-03-06 DIAGNOSIS — J45909 Unspecified asthma, uncomplicated: Secondary | ICD-10-CM | POA: Diagnosis not present

## 2013-04-03 DIAGNOSIS — J45909 Unspecified asthma, uncomplicated: Secondary | ICD-10-CM | POA: Diagnosis not present

## 2013-04-04 DIAGNOSIS — J45909 Unspecified asthma, uncomplicated: Secondary | ICD-10-CM | POA: Diagnosis not present

## 2013-04-24 DIAGNOSIS — S2341XA Sprain of ribs, initial encounter: Secondary | ICD-10-CM | POA: Diagnosis not present

## 2013-04-29 ENCOUNTER — Telehealth: Payer: Self-pay | Admitting: Internal Medicine

## 2013-04-29 NOTE — Telephone Encounter (Signed)
Patient c/o several months history of loose stool.  She is offered an appt for 05/01/13, but she declines due to another appt. She is scheduled for 07/02/13 9:15, I have placed her on the wait list

## 2013-04-29 NOTE — Telephone Encounter (Signed)
Left message for patient to call back  

## 2013-05-01 DIAGNOSIS — J45909 Unspecified asthma, uncomplicated: Secondary | ICD-10-CM | POA: Diagnosis not present

## 2013-05-01 DIAGNOSIS — E119 Type 2 diabetes mellitus without complications: Secondary | ICD-10-CM | POA: Diagnosis not present

## 2013-05-02 DIAGNOSIS — E119 Type 2 diabetes mellitus without complications: Secondary | ICD-10-CM | POA: Diagnosis not present

## 2013-05-02 DIAGNOSIS — Z006 Encounter for examination for normal comparison and control in clinical research program: Secondary | ICD-10-CM | POA: Diagnosis not present

## 2013-05-02 DIAGNOSIS — E782 Mixed hyperlipidemia: Secondary | ICD-10-CM | POA: Diagnosis not present

## 2013-05-19 DIAGNOSIS — K521 Toxic gastroenteritis and colitis: Secondary | ICD-10-CM | POA: Diagnosis not present

## 2013-05-19 DIAGNOSIS — J309 Allergic rhinitis, unspecified: Secondary | ICD-10-CM | POA: Diagnosis not present

## 2013-05-19 DIAGNOSIS — J069 Acute upper respiratory infection, unspecified: Secondary | ICD-10-CM | POA: Diagnosis not present

## 2013-05-20 DIAGNOSIS — H3530 Unspecified macular degeneration: Secondary | ICD-10-CM | POA: Diagnosis not present

## 2013-05-20 DIAGNOSIS — H04219 Epiphora due to excess lacrimation, unspecified lacrimal gland: Secondary | ICD-10-CM | POA: Diagnosis not present

## 2013-05-20 DIAGNOSIS — Z961 Presence of intraocular lens: Secondary | ICD-10-CM | POA: Diagnosis not present

## 2013-05-20 DIAGNOSIS — E119 Type 2 diabetes mellitus without complications: Secondary | ICD-10-CM | POA: Diagnosis not present

## 2013-05-24 ENCOUNTER — Encounter: Payer: Self-pay | Admitting: *Deleted

## 2013-05-29 DIAGNOSIS — J45909 Unspecified asthma, uncomplicated: Secondary | ICD-10-CM | POA: Diagnosis not present

## 2013-05-29 DIAGNOSIS — J309 Allergic rhinitis, unspecified: Secondary | ICD-10-CM | POA: Diagnosis not present

## 2013-05-30 DIAGNOSIS — J309 Allergic rhinitis, unspecified: Secondary | ICD-10-CM | POA: Diagnosis not present

## 2013-05-30 DIAGNOSIS — J45909 Unspecified asthma, uncomplicated: Secondary | ICD-10-CM | POA: Diagnosis not present

## 2013-06-26 DIAGNOSIS — J45909 Unspecified asthma, uncomplicated: Secondary | ICD-10-CM | POA: Diagnosis not present

## 2013-06-27 DIAGNOSIS — J45909 Unspecified asthma, uncomplicated: Secondary | ICD-10-CM | POA: Diagnosis not present

## 2013-07-02 ENCOUNTER — Ambulatory Visit (INDEPENDENT_AMBULATORY_CARE_PROVIDER_SITE_OTHER): Payer: Medicare Other | Admitting: Internal Medicine

## 2013-07-02 ENCOUNTER — Other Ambulatory Visit (INDEPENDENT_AMBULATORY_CARE_PROVIDER_SITE_OTHER): Payer: Medicare Other

## 2013-07-02 ENCOUNTER — Encounter: Payer: Self-pay | Admitting: Internal Medicine

## 2013-07-02 VITALS — BP 96/50 | HR 72 | Ht 60.5 in | Wt 139.1 lb

## 2013-07-02 DIAGNOSIS — R159 Full incontinence of feces: Secondary | ICD-10-CM

## 2013-07-02 DIAGNOSIS — K589 Irritable bowel syndrome without diarrhea: Secondary | ICD-10-CM

## 2013-07-02 LAB — SEDIMENTATION RATE: Sed Rate: 47 mm/hr — ABNORMAL HIGH (ref 0–22)

## 2013-07-02 MED ORDER — HYDROCORTISONE 1 % RE CREA: TOPICAL_CREAM | RECTAL | Status: DC

## 2013-07-02 MED ORDER — COLESTIPOL HCL 1 G PO TABS: 1.0000 g | ORAL_TABLET | Freq: Two times a day (BID) | ORAL | Status: DC

## 2013-07-02 MED ORDER — DICYCLOMINE HCL 20 MG PO TABS: 20.0000 mg | ORAL_TABLET | Freq: Two times a day (BID) | ORAL | Status: DC

## 2013-07-02 NOTE — Progress Notes (Signed)
AAILYAH DUNBAR 1946-02-03 MRN [id number]   History of Present Illness:  This is a 67 year old white female with irritable bowel syndrome and fecal incontinence which was evaluated over the past 15 years on multiple occasions. In 2003 she was seen at [facility] by Dr.Weidener, and diagnosed with stage II anterior and posterior vaginal prolapse. Surgery was recommended together with physical therapy but the patient was not interested. She is now complaining of exacerbation of diarrhea and fecal incontinence. She has small loose bowel movements every time she urinates. She denies rectal bleeding but her rectum becomes very irritated after multiple bowel movements. Her weight has been stable at around 139 pounds. Her last colonoscopy in May 2011 with random biopsies to rule out microscopic colitis were negative. She had an adenomatous polyp. Her recall colonoscopy is due in May  2016. She also has a history of gastroesophageal reflux and esophageal stricture evaluated in 1998. No stricture was found on her last endoscopy in June 2009. She had a cystocele repair in 2008.    Past Medical History  Diagnosis Date  . Unspecified hearing loss   . Esophagitis, unspecified   . Diaphragmatic hernia without mention of obstruction or gangrene   . Incontinence of feces   . Unspecified essential hypertension   . Unspecified asthma(493.90)   . Sarcoidosis   . Irritable bowel syndrome   . Esophageal reflux   . H/O adenomatous polyp of colon 2011  . Anxiety   . DM (diabetes mellitus)   . Depression   . Status post dilation of esophageal narrowing   . Kidney stones   . Sleep apnea    Past Surgical History  Procedure Laterality Date  . Partial hysterectomy    . Tonsillectomy and adenoidectomy    . Nasal septum surgery    . Tubal ligation    . Hemorrhoid surgery      reports that she has never smoked. She has never used smokeless tobacco. She reports that she does not drink alcohol or use illicit  drugs. family history includes Diabetes in her maternal grandmother and mother and Esophageal cancer in her father.  There is no history of Colon cancer. No Known Allergies      Review of Systems:Occasional dysphagia. Denies heartburn negative for rectal bleeding  The remainder of the 10 point ROS is negative except as outlined in H&P   Physical Exam: General appearance  Well developed, in no distress. Eyes- non icteric. HEENT nontraumatic, normocephalic. Mouth no lesions, tongue papillated, no cheilosis. Neck supple without adenopathy, thyroid not enlarged, no carotid bruits, no JVD. Lungs Clear to auscultation bilaterally. Cor normal S1, normal S2, regular rhythm, no murmur,  quiet precordium. Abdomen:Mildly protuberant with normoactive bowel sounds. Nontender. No distention. No ascites.  Rectal:Minimal erythema around the anal canal. Decreased rectal sphincter tone. Weak sqeeze, moderate amount of soft pasty Hemoccult-negative stool.  Extremities 1+  pedal edema. Skin no lesions.Multiple ecchymoses lower extremities  Neurological alert and oriented x 3. Psychological normal mood and affect.  Assessment and Plan:  Problem #10 67 year old white female with severe irritable bowel syndrome with frequent exacerbations. She also has fecal incontinence due to pelvic relaxation. She had a surgical evaluation at [facility] in 2003 and she is willing to be seen again for physical therapy for fecal incontinence. I will refer her to our clinic in [place]. We will start her on Colestid 1 g twice a day and Bentyl 20 mg twice a day. We will check a sprue profile and sedimentation rate  today.  Hx of adenomatous polyp 2011, recall colon 03/2015   07/02/2013 Lina Sar

## 2013-07-02 NOTE — Patient Instructions (Addendum)
We have sent the following medications to your pharmacy for you to pick up at your convenience: Hydrocortisone Bentyl Colestid  You have been scheduled for an appointment with Ruben Gottron, PT @ [facility]. Your appointment is on Thursday 07/11/13 at 9:00 am. You should receive a new patient packet in the mail. Be sure to bring insurance cards, copay (if you have one) and a list of all current medications with you to your appointment. Should you need to reschedule this appointment, please contact their office directly at [phone].   Your physician has requested that you go to the basement for the following lab work before leaving today: Celiac 10, Sed Rate  Dr Lucila Maine

## 2013-07-03 ENCOUNTER — Telehealth: Payer: Self-pay | Admitting: Internal Medicine

## 2013-07-03 LAB — CELIAC PANEL 10
Gliadin IgG: 2 U/mL (ref <20)
IgA: 190 mg/dL (ref 69–380)

## 2013-07-04 NOTE — Telephone Encounter (Signed)
Spoke with patient and she is feeling better today. She wants to know if the pharmacy sent the prior auth for Bentyl and Proctocort. Dottie, did you get this?

## 2013-07-04 NOTE — Telephone Encounter (Signed)
Left a message for patient to call me. 

## 2013-07-05 NOTE — Telephone Encounter (Signed)
I spoke to pharmacy yesterday and advised that patient may have generic hydrocortisone 1% cream (they were unable to substitute hydrocortisone for proctocort without our permission). Therefore, the medication should be much more affordable. I have also spoken to patient's insurance company who has approved bentyl until 11/27/13. Pharmacy states that the medication has now gone through. Patient has been advised that medication should be available at her pharmacy. She verbalizes understanding.

## 2013-07-11 DIAGNOSIS — M6281 Muscle weakness (generalized): Secondary | ICD-10-CM | POA: Diagnosis not present

## 2013-07-11 DIAGNOSIS — R279 Unspecified lack of coordination: Secondary | ICD-10-CM | POA: Diagnosis not present

## 2013-07-11 DIAGNOSIS — R32 Unspecified urinary incontinence: Secondary | ICD-10-CM | POA: Diagnosis not present

## 2013-07-11 DIAGNOSIS — R159 Full incontinence of feces: Secondary | ICD-10-CM | POA: Diagnosis not present

## 2013-07-18 DIAGNOSIS — R279 Unspecified lack of coordination: Secondary | ICD-10-CM | POA: Diagnosis not present

## 2013-07-18 DIAGNOSIS — R32 Unspecified urinary incontinence: Secondary | ICD-10-CM | POA: Diagnosis not present

## 2013-07-18 DIAGNOSIS — R159 Full incontinence of feces: Secondary | ICD-10-CM | POA: Diagnosis not present

## 2013-07-18 DIAGNOSIS — M6281 Muscle weakness (generalized): Secondary | ICD-10-CM | POA: Diagnosis not present

## 2013-07-23 DIAGNOSIS — R159 Full incontinence of feces: Secondary | ICD-10-CM | POA: Diagnosis not present

## 2013-07-23 DIAGNOSIS — M6281 Muscle weakness (generalized): Secondary | ICD-10-CM | POA: Diagnosis not present

## 2013-07-23 DIAGNOSIS — R279 Unspecified lack of coordination: Secondary | ICD-10-CM | POA: Diagnosis not present

## 2013-07-23 DIAGNOSIS — R32 Unspecified urinary incontinence: Secondary | ICD-10-CM | POA: Diagnosis not present

## 2013-07-24 DIAGNOSIS — J45909 Unspecified asthma, uncomplicated: Secondary | ICD-10-CM | POA: Diagnosis not present

## 2013-07-25 DIAGNOSIS — J45909 Unspecified asthma, uncomplicated: Secondary | ICD-10-CM | POA: Diagnosis not present

## 2013-07-31 DIAGNOSIS — R159 Full incontinence of feces: Secondary | ICD-10-CM | POA: Diagnosis not present

## 2013-07-31 DIAGNOSIS — M6281 Muscle weakness (generalized): Secondary | ICD-10-CM | POA: Diagnosis not present

## 2013-07-31 DIAGNOSIS — R279 Unspecified lack of coordination: Secondary | ICD-10-CM | POA: Diagnosis not present

## 2013-07-31 DIAGNOSIS — R32 Unspecified urinary incontinence: Secondary | ICD-10-CM | POA: Diagnosis not present

## 2013-08-06 DIAGNOSIS — R32 Unspecified urinary incontinence: Secondary | ICD-10-CM | POA: Diagnosis not present

## 2013-08-06 DIAGNOSIS — R159 Full incontinence of feces: Secondary | ICD-10-CM | POA: Diagnosis not present

## 2013-08-06 DIAGNOSIS — R279 Unspecified lack of coordination: Secondary | ICD-10-CM | POA: Diagnosis not present

## 2013-08-06 DIAGNOSIS — M6281 Muscle weakness (generalized): Secondary | ICD-10-CM | POA: Diagnosis not present

## 2013-08-21 DIAGNOSIS — J45909 Unspecified asthma, uncomplicated: Secondary | ICD-10-CM | POA: Diagnosis not present

## 2013-08-22 DIAGNOSIS — J45909 Unspecified asthma, uncomplicated: Secondary | ICD-10-CM | POA: Diagnosis not present

## 2013-08-28 ENCOUNTER — Other Ambulatory Visit: Payer: Self-pay | Admitting: *Deleted

## 2013-08-28 DIAGNOSIS — Z23 Encounter for immunization: Secondary | ICD-10-CM | POA: Diagnosis not present

## 2013-08-28 DIAGNOSIS — J309 Allergic rhinitis, unspecified: Secondary | ICD-10-CM | POA: Diagnosis not present

## 2013-08-28 DIAGNOSIS — M6281 Muscle weakness (generalized): Secondary | ICD-10-CM | POA: Diagnosis not present

## 2013-08-28 DIAGNOSIS — R279 Unspecified lack of coordination: Secondary | ICD-10-CM | POA: Diagnosis not present

## 2013-08-28 DIAGNOSIS — J45909 Unspecified asthma, uncomplicated: Secondary | ICD-10-CM | POA: Diagnosis not present

## 2013-08-28 DIAGNOSIS — R159 Full incontinence of feces: Secondary | ICD-10-CM | POA: Diagnosis not present

## 2013-08-28 DIAGNOSIS — R32 Unspecified urinary incontinence: Secondary | ICD-10-CM | POA: Diagnosis not present

## 2013-09-04 DIAGNOSIS — J45909 Unspecified asthma, uncomplicated: Secondary | ICD-10-CM | POA: Diagnosis not present

## 2013-09-04 DIAGNOSIS — J309 Allergic rhinitis, unspecified: Secondary | ICD-10-CM | POA: Diagnosis not present

## 2013-09-18 DIAGNOSIS — J45909 Unspecified asthma, uncomplicated: Secondary | ICD-10-CM | POA: Diagnosis not present

## 2013-09-19 ENCOUNTER — Encounter: Payer: Self-pay | Admitting: Cardiovascular Disease

## 2013-09-19 DIAGNOSIS — J45909 Unspecified asthma, uncomplicated: Secondary | ICD-10-CM | POA: Diagnosis not present

## 2013-10-16 DIAGNOSIS — J45909 Unspecified asthma, uncomplicated: Secondary | ICD-10-CM | POA: Diagnosis not present

## 2013-10-17 DIAGNOSIS — Z79899 Other long term (current) drug therapy: Secondary | ICD-10-CM | POA: Diagnosis not present

## 2013-10-17 DIAGNOSIS — E119 Type 2 diabetes mellitus without complications: Secondary | ICD-10-CM | POA: Diagnosis not present

## 2013-10-17 DIAGNOSIS — J45909 Unspecified asthma, uncomplicated: Secondary | ICD-10-CM | POA: Diagnosis not present

## 2013-10-30 DIAGNOSIS — R072 Precordial pain: Secondary | ICD-10-CM | POA: Diagnosis not present

## 2013-10-30 DIAGNOSIS — E119 Type 2 diabetes mellitus without complications: Secondary | ICD-10-CM | POA: Diagnosis not present

## 2013-11-01 ENCOUNTER — Encounter: Payer: Self-pay | Admitting: Cardiovascular Disease

## 2013-11-01 ENCOUNTER — Ambulatory Visit (INDEPENDENT_AMBULATORY_CARE_PROVIDER_SITE_OTHER): Payer: Medicare Other | Admitting: Cardiovascular Disease

## 2013-11-01 VITALS — BP 122/64 | HR 62 | Wt 142.0 lb

## 2013-11-01 DIAGNOSIS — D869 Sarcoidosis, unspecified: Secondary | ICD-10-CM

## 2013-11-01 DIAGNOSIS — R079 Chest pain, unspecified: Secondary | ICD-10-CM

## 2013-11-01 DIAGNOSIS — E119 Type 2 diabetes mellitus without complications: Secondary | ICD-10-CM

## 2013-11-01 DIAGNOSIS — J45909 Unspecified asthma, uncomplicated: Secondary | ICD-10-CM

## 2013-11-01 DIAGNOSIS — I10 Essential (primary) hypertension: Secondary | ICD-10-CM | POA: Diagnosis not present

## 2013-11-01 DIAGNOSIS — R011 Cardiac murmur, unspecified: Secondary | ICD-10-CM | POA: Insufficient documentation

## 2013-11-01 HISTORY — DX: Chest pain, unspecified: R07.9

## 2013-11-01 HISTORY — DX: Cardiac murmur, unspecified: R01.1

## 2013-11-01 NOTE — Progress Notes (Signed)
Patient ID: [name], female   DOB: 07/06/1946, 67 y.o.   MRN: [id number] 67 yo referred by Dr Lorin Picket for chest pain.  She has DM.  She has been seen by Dr Kirke Corin in [place] about 5 years ago with normal myovue.  Describes atypical chest pain Mostly in shoulders and under left breast.  Can last all day. Not always exertional.  Pain chronic.  Has been more wheezy since placed on beta blocker. History of ? Sarcoid and also asthma.  No documented CAD.  Mild exertional dspnea.  Pain not pleuritic  Not taken any nitro for it yet    ROS: Denies fever, malais, weight loss, blurry vision, decreased visual acuity, cough, sputum, SOB, hemoptysis, pleuritic pain, palpitaitons, heartburn, abdominal pain, melena, lower extremity edema, claudication, or rash.  All other systems reviewed and negative   General: Affect appropriate Healthy:  appears stated age HEENT: normal  Bilateral hearing aids  Neck supple with no adenopathy JVP normal no bruits no thyromegaly Lungs clear withwheezing and good diaphragmatic motion Heart:  S1/S2 SEM murmur,rub, gallop or click PMI normal Abdomen: benighn, BS positve, no tenderness, no AAA no bruit.  No HSM or HJR Distal pulses intact with no bruits No edema Neuro non-focal Skin warm and dry No muscular weakness  Medications Current Outpatient Prescriptions  Medication Sig Dispense Refill  . albuterol (PROAIR HFA) 108 (90 BASE) MCG/ACT inhaler Inhale 2 puffs into the lungs as needed for wheezing.      Marland Kitchen atorvastatin (LIPITOR) 20 MG tablet Take 20 mg by mouth daily.      . beclomethasone (QVAR) 80 MCG/ACT inhaler Inhale 2 puffs into the lungs daily.        . citalopram (CELEXA) 20 MG tablet Take 20 mg by mouth daily.        . colestipol (COLESTID) 1 G tablet Take 1 tablet (1 g total) by mouth 2 (two) times daily.  60 tablet  1  . dicyclomine (BENTYL) 20 MG tablet Take 1 tablet (20 mg total) by mouth 2 (two) times daily.  60 tablet  1  . fexofenadine (ALLEGRA)  180 MG tablet Take 180 mg by mouth daily.        . hydrochlorothiazide (HYDRODIURIL) 12.5 MG tablet Take 12.5 mg by mouth daily.      . hydrocortisone (PROCTOCORT) 1 % CREA Apply to rectum three times daily as needed  30 g  1  . Ibuprofen 200 MG CAPS Take by mouth as needed.        . Liraglutide (VICTOZA [place]) Inject into the skin. 0.6 every night      . losartan (COZAAR) 100 MG tablet Take 100 mg by mouth daily.      . mometasone-formoterol (DULERA) 200-5 MCG/ACT AERO Inhale 2 puffs into the lungs 2 (two) times daily.      Marland Kitchen omalizumab (XOLAIR) 150 MG injection Inject 150 mg into the skin every 28 (twenty-eight) days.      Marland Kitchen omeprazole (PRILOSEC) 40 MG capsule Take 40 mg by mouth 2 (two) times daily.       . pioglitazone (ACTOS) 30 MG tablet Take 30 mg by mouth daily.      . solifenacin (VESICARE) 5 MG tablet Take 10 mg by mouth as needed.        No current facility-administered medications for this visit.    Allergies Review of patient's allergies indicates no known allergies.  Family History: Family History  Problem Relation Age of Onset  . Esophageal  cancer Father   . Diabetes Mother   . Colon cancer Neg Hx   . Diabetes Maternal Grandmother     Social History: History   Social History  . Marital Status: Married    Spouse Name: N/A    Number of Children: 2  . Years of Education: N/A   Occupational History  . retired    Social History Main Topics  . Smoking status: Never Smoker   . Smokeless tobacco: Never Used  . Alcohol Use: No  . Drug Use: No  . Sexual Activity: Not on file   Other Topics Concern  . Not on file   Social History Narrative   Daily caffeine use.     Electrocardiogram:  SR rate 60 nonspecific lateral T wave changes   Assessment and Plan

## 2013-11-01 NOTE — Assessment & Plan Note (Signed)
Benign AV sclerosis murmur Valve will be seen during stress echo

## 2013-11-01 NOTE — Assessment & Plan Note (Signed)
Stop beta blocker  F/u primary continue inhalers would not use lexiscan for myovue

## 2013-11-01 NOTE — Assessment & Plan Note (Signed)
Despite DM pain is very atypical ECG nonspecific changes Stop beta blocker due to wheezing  Dobutamine echo

## 2013-11-01 NOTE — Patient Instructions (Signed)
Your physician recommends that you schedule a follow-up appointment in:  AS NEEDED  Your physician has recommended you make the following change in your medication:   STOP  TOPROL  Your physician has requested that you have a dobutamine echocardiogram. For further information please visit [email or web address] Please follow instruction sheet as given.

## 2013-11-01 NOTE — Assessment & Plan Note (Signed)
Discussed low carb diet.  Target hemoglobin A1c is 6.5 or less.  Continue current medications.  

## 2013-11-01 NOTE — Assessment & Plan Note (Signed)
Offered her f/u with Dr Sherene Sires and [facility] She will discuss with Dr Lorin Picket

## 2013-11-01 NOTE — Assessment & Plan Note (Signed)
Well controlled.  Continue current medications and low sodium Dash type diet.    

## 2013-11-06 ENCOUNTER — Other Ambulatory Visit (HOSPITAL_COMMUNITY): Payer: Medicare Other

## 2013-11-08 ENCOUNTER — Other Ambulatory Visit (HOSPITAL_COMMUNITY): Payer: Medicare Other | Admitting: Cardiovascular Disease

## 2013-11-08 ENCOUNTER — Ambulatory Visit (HOSPITAL_COMMUNITY): Payer: Medicare Other | Attending: Cardiology | Admitting: Radiology

## 2013-11-08 ENCOUNTER — Encounter: Payer: Self-pay | Admitting: Cardiology

## 2013-11-08 DIAGNOSIS — R072 Precordial pain: Secondary | ICD-10-CM | POA: Diagnosis not present

## 2013-11-08 DIAGNOSIS — R0602 Shortness of breath: Secondary | ICD-10-CM | POA: Diagnosis not present

## 2013-11-08 DIAGNOSIS — R079 Chest pain, unspecified: Secondary | ICD-10-CM | POA: Diagnosis not present

## 2013-11-08 MED ORDER — SODIUM CHLORIDE 0.9 % IV SOLN
40.0000 ug/kg | Freq: Once | INTRAVENOUS | Status: AC
  Administered 2013-11-08: 11:00:00 via INTRAVENOUS

## 2013-11-08 NOTE — Progress Notes (Signed)
Dobutamine Stress Echocardiogram performed.  

## 2013-11-11 ENCOUNTER — Telehealth: Payer: Self-pay | Admitting: Cardiovascular Disease

## 2013-11-11 NOTE — Telephone Encounter (Signed)
PT  AWARE SEE RESULTS  NOTE .Zack Seal

## 2013-11-11 NOTE — Telephone Encounter (Signed)
New message ° ° ° ° ° °Want echo results °

## 2013-11-13 DIAGNOSIS — J45909 Unspecified asthma, uncomplicated: Secondary | ICD-10-CM | POA: Diagnosis not present

## 2013-11-14 DIAGNOSIS — J45909 Unspecified asthma, uncomplicated: Secondary | ICD-10-CM | POA: Diagnosis not present

## 2013-12-11 DIAGNOSIS — J309 Allergic rhinitis, unspecified: Secondary | ICD-10-CM | POA: Diagnosis not present

## 2013-12-11 DIAGNOSIS — J45909 Unspecified asthma, uncomplicated: Secondary | ICD-10-CM | POA: Diagnosis not present

## 2013-12-11 DIAGNOSIS — R079 Chest pain, unspecified: Secondary | ICD-10-CM | POA: Diagnosis not present

## 2013-12-12 DIAGNOSIS — J45909 Unspecified asthma, uncomplicated: Secondary | ICD-10-CM | POA: Diagnosis not present

## 2014-01-08 DIAGNOSIS — J45909 Unspecified asthma, uncomplicated: Secondary | ICD-10-CM | POA: Diagnosis not present

## 2014-01-09 DIAGNOSIS — J45909 Unspecified asthma, uncomplicated: Secondary | ICD-10-CM | POA: Diagnosis not present

## 2014-02-05 DIAGNOSIS — Z79899 Other long term (current) drug therapy: Secondary | ICD-10-CM | POA: Diagnosis not present

## 2014-02-05 DIAGNOSIS — E559 Vitamin D deficiency, unspecified: Secondary | ICD-10-CM | POA: Diagnosis not present

## 2014-02-05 DIAGNOSIS — R002 Palpitations: Secondary | ICD-10-CM | POA: Diagnosis not present

## 2014-02-05 DIAGNOSIS — J45909 Unspecified asthma, uncomplicated: Secondary | ICD-10-CM | POA: Diagnosis not present

## 2014-02-05 DIAGNOSIS — M810 Age-related osteoporosis without current pathological fracture: Secondary | ICD-10-CM | POA: Diagnosis not present

## 2014-02-05 DIAGNOSIS — E119 Type 2 diabetes mellitus without complications: Secondary | ICD-10-CM | POA: Diagnosis not present

## 2014-02-05 DIAGNOSIS — I10 Essential (primary) hypertension: Secondary | ICD-10-CM | POA: Diagnosis not present

## 2014-02-05 DIAGNOSIS — Z23 Encounter for immunization: Secondary | ICD-10-CM | POA: Diagnosis not present

## 2014-02-06 DIAGNOSIS — Z1211 Encounter for screening for malignant neoplasm of colon: Secondary | ICD-10-CM | POA: Diagnosis not present

## 2014-02-06 DIAGNOSIS — J45909 Unspecified asthma, uncomplicated: Secondary | ICD-10-CM | POA: Diagnosis not present

## 2014-02-12 DIAGNOSIS — D649 Anemia, unspecified: Secondary | ICD-10-CM | POA: Diagnosis not present

## 2014-02-13 DIAGNOSIS — M79609 Pain in unspecified limb: Secondary | ICD-10-CM | POA: Diagnosis not present

## 2014-02-26 DIAGNOSIS — Z1231 Encounter for screening mammogram for malignant neoplasm of breast: Secondary | ICD-10-CM | POA: Diagnosis not present

## 2014-03-05 DIAGNOSIS — H9319 Tinnitus, unspecified ear: Secondary | ICD-10-CM | POA: Diagnosis not present

## 2014-03-06 DIAGNOSIS — J45909 Unspecified asthma, uncomplicated: Secondary | ICD-10-CM | POA: Diagnosis not present

## 2014-03-07 DIAGNOSIS — J45909 Unspecified asthma, uncomplicated: Secondary | ICD-10-CM | POA: Diagnosis not present

## 2014-03-28 DIAGNOSIS — J301 Allergic rhinitis due to pollen: Secondary | ICD-10-CM | POA: Diagnosis not present

## 2014-03-28 DIAGNOSIS — J0100 Acute maxillary sinusitis, unspecified: Secondary | ICD-10-CM | POA: Diagnosis not present

## 2014-04-02 DIAGNOSIS — J45909 Unspecified asthma, uncomplicated: Secondary | ICD-10-CM | POA: Diagnosis not present

## 2014-04-03 DIAGNOSIS — J45909 Unspecified asthma, uncomplicated: Secondary | ICD-10-CM | POA: Diagnosis not present

## 2014-04-09 DIAGNOSIS — J45909 Unspecified asthma, uncomplicated: Secondary | ICD-10-CM | POA: Diagnosis not present

## 2014-05-07 DIAGNOSIS — J45909 Unspecified asthma, uncomplicated: Secondary | ICD-10-CM | POA: Diagnosis not present

## 2014-05-08 DIAGNOSIS — J45909 Unspecified asthma, uncomplicated: Secondary | ICD-10-CM | POA: Diagnosis not present

## 2014-05-23 DIAGNOSIS — E119 Type 2 diabetes mellitus without complications: Secondary | ICD-10-CM | POA: Diagnosis not present

## 2014-05-23 DIAGNOSIS — H43819 Vitreous degeneration, unspecified eye: Secondary | ICD-10-CM | POA: Diagnosis not present

## 2014-05-23 DIAGNOSIS — Z961 Presence of intraocular lens: Secondary | ICD-10-CM | POA: Diagnosis not present

## 2014-05-23 DIAGNOSIS — H3530 Unspecified macular degeneration: Secondary | ICD-10-CM | POA: Diagnosis not present

## 2014-06-04 DIAGNOSIS — J45909 Unspecified asthma, uncomplicated: Secondary | ICD-10-CM | POA: Diagnosis not present

## 2014-06-05 DIAGNOSIS — J45909 Unspecified asthma, uncomplicated: Secondary | ICD-10-CM | POA: Diagnosis not present

## 2014-06-12 DIAGNOSIS — E559 Vitamin D deficiency, unspecified: Secondary | ICD-10-CM | POA: Diagnosis not present

## 2014-06-12 DIAGNOSIS — K589 Irritable bowel syndrome without diarrhea: Secondary | ICD-10-CM | POA: Diagnosis not present

## 2014-07-02 DIAGNOSIS — J45909 Unspecified asthma, uncomplicated: Secondary | ICD-10-CM | POA: Diagnosis not present

## 2014-07-03 DIAGNOSIS — J45909 Unspecified asthma, uncomplicated: Secondary | ICD-10-CM | POA: Diagnosis not present

## 2014-07-14 DIAGNOSIS — K589 Irritable bowel syndrome without diarrhea: Secondary | ICD-10-CM | POA: Diagnosis not present

## 2014-07-14 DIAGNOSIS — J45901 Unspecified asthma with (acute) exacerbation: Secondary | ICD-10-CM | POA: Diagnosis not present

## 2014-07-14 DIAGNOSIS — J309 Allergic rhinitis, unspecified: Secondary | ICD-10-CM | POA: Diagnosis not present

## 2014-07-30 DIAGNOSIS — J45909 Unspecified asthma, uncomplicated: Secondary | ICD-10-CM | POA: Diagnosis not present

## 2014-07-31 DIAGNOSIS — J45909 Unspecified asthma, uncomplicated: Secondary | ICD-10-CM | POA: Diagnosis not present

## 2014-08-06 DIAGNOSIS — E119 Type 2 diabetes mellitus without complications: Secondary | ICD-10-CM | POA: Diagnosis not present

## 2014-08-13 DIAGNOSIS — E119 Type 2 diabetes mellitus without complications: Secondary | ICD-10-CM | POA: Diagnosis not present

## 2014-08-13 DIAGNOSIS — Z23 Encounter for immunization: Secondary | ICD-10-CM | POA: Diagnosis not present

## 2014-08-26 DIAGNOSIS — J45909 Unspecified asthma, uncomplicated: Secondary | ICD-10-CM | POA: Diagnosis not present

## 2014-08-27 DIAGNOSIS — J45909 Unspecified asthma, uncomplicated: Secondary | ICD-10-CM | POA: Diagnosis not present

## 2014-09-17 DIAGNOSIS — J4540 Moderate persistent asthma, uncomplicated: Secondary | ICD-10-CM | POA: Diagnosis not present

## 2014-09-18 DIAGNOSIS — J4540 Moderate persistent asthma, uncomplicated: Secondary | ICD-10-CM | POA: Diagnosis not present

## 2014-10-08 DIAGNOSIS — J309 Allergic rhinitis, unspecified: Secondary | ICD-10-CM | POA: Diagnosis not present

## 2014-10-08 DIAGNOSIS — J4540 Moderate persistent asthma, uncomplicated: Secondary | ICD-10-CM | POA: Diagnosis not present

## 2014-10-15 DIAGNOSIS — J4540 Moderate persistent asthma, uncomplicated: Secondary | ICD-10-CM | POA: Diagnosis not present

## 2014-10-16 DIAGNOSIS — J4540 Moderate persistent asthma, uncomplicated: Secondary | ICD-10-CM | POA: Diagnosis not present

## 2014-11-05 DIAGNOSIS — J4551 Severe persistent asthma with (acute) exacerbation: Secondary | ICD-10-CM | POA: Diagnosis not present

## 2014-11-12 DIAGNOSIS — J45901 Unspecified asthma with (acute) exacerbation: Secondary | ICD-10-CM | POA: Diagnosis not present

## 2014-11-12 DIAGNOSIS — J4540 Moderate persistent asthma, uncomplicated: Secondary | ICD-10-CM | POA: Diagnosis not present

## 2014-11-16 DIAGNOSIS — R0602 Shortness of breath: Secondary | ICD-10-CM | POA: Diagnosis not present

## 2014-11-16 DIAGNOSIS — Z882 Allergy status to sulfonamides status: Secondary | ICD-10-CM | POA: Diagnosis not present

## 2014-11-16 DIAGNOSIS — Z888 Allergy status to other drugs, medicaments and biological substances status: Secondary | ICD-10-CM | POA: Diagnosis not present

## 2014-11-16 DIAGNOSIS — I10 Essential (primary) hypertension: Secondary | ICD-10-CM | POA: Diagnosis present

## 2014-11-16 DIAGNOSIS — R0789 Other chest pain: Secondary | ICD-10-CM | POA: Diagnosis not present

## 2014-11-16 DIAGNOSIS — J479 Bronchiectasis, uncomplicated: Secondary | ICD-10-CM | POA: Diagnosis not present

## 2014-11-16 DIAGNOSIS — K219 Gastro-esophageal reflux disease without esophagitis: Secondary | ICD-10-CM | POA: Diagnosis present

## 2014-11-16 DIAGNOSIS — J984 Other disorders of lung: Secondary | ICD-10-CM | POA: Diagnosis not present

## 2014-11-16 DIAGNOSIS — N390 Urinary tract infection, site not specified: Secondary | ICD-10-CM | POA: Diagnosis present

## 2014-11-16 DIAGNOSIS — E1165 Type 2 diabetes mellitus with hyperglycemia: Secondary | ICD-10-CM | POA: Diagnosis present

## 2014-11-16 DIAGNOSIS — E780 Pure hypercholesterolemia: Secondary | ICD-10-CM | POA: Diagnosis present

## 2014-11-16 DIAGNOSIS — E860 Dehydration: Secondary | ICD-10-CM | POA: Diagnosis present

## 2014-11-16 DIAGNOSIS — E871 Hypo-osmolality and hyponatremia: Secondary | ICD-10-CM | POA: Diagnosis present

## 2014-11-16 DIAGNOSIS — M069 Rheumatoid arthritis, unspecified: Secondary | ICD-10-CM | POA: Diagnosis present

## 2014-11-16 DIAGNOSIS — F329 Major depressive disorder, single episode, unspecified: Secondary | ICD-10-CM | POA: Diagnosis present

## 2014-11-16 DIAGNOSIS — J90 Pleural effusion, not elsewhere classified: Secondary | ICD-10-CM | POA: Diagnosis not present

## 2014-11-16 DIAGNOSIS — J45901 Unspecified asthma with (acute) exacerbation: Secondary | ICD-10-CM | POA: Diagnosis present

## 2014-11-16 DIAGNOSIS — J189 Pneumonia, unspecified organism: Secondary | ICD-10-CM | POA: Diagnosis not present

## 2014-11-16 DIAGNOSIS — J180 Bronchopneumonia, unspecified organism: Secondary | ICD-10-CM | POA: Diagnosis not present

## 2014-11-16 DIAGNOSIS — R0600 Dyspnea, unspecified: Secondary | ICD-10-CM | POA: Diagnosis not present

## 2014-11-25 DIAGNOSIS — J159 Unspecified bacterial pneumonia: Secondary | ICD-10-CM | POA: Diagnosis not present

## 2014-11-25 DIAGNOSIS — R300 Dysuria: Secondary | ICD-10-CM | POA: Diagnosis not present

## 2014-11-25 DIAGNOSIS — L0100 Impetigo, unspecified: Secondary | ICD-10-CM | POA: Diagnosis not present

## 2014-11-25 DIAGNOSIS — B370 Candidal stomatitis: Secondary | ICD-10-CM | POA: Diagnosis not present

## 2014-11-25 DIAGNOSIS — J4551 Severe persistent asthma with (acute) exacerbation: Secondary | ICD-10-CM | POA: Diagnosis not present

## 2014-12-10 DIAGNOSIS — J4540 Moderate persistent asthma, uncomplicated: Secondary | ICD-10-CM | POA: Diagnosis not present

## 2014-12-11 DIAGNOSIS — J4540 Moderate persistent asthma, uncomplicated: Secondary | ICD-10-CM | POA: Diagnosis not present

## 2014-12-11 DIAGNOSIS — J310 Chronic rhinitis: Secondary | ICD-10-CM | POA: Diagnosis not present

## 2014-12-11 DIAGNOSIS — J479 Bronchiectasis, uncomplicated: Secondary | ICD-10-CM | POA: Diagnosis not present

## 2014-12-11 DIAGNOSIS — G4733 Obstructive sleep apnea (adult) (pediatric): Secondary | ICD-10-CM | POA: Diagnosis not present

## 2014-12-11 DIAGNOSIS — D860 Sarcoidosis of lung: Secondary | ICD-10-CM | POA: Diagnosis not present

## 2014-12-12 DIAGNOSIS — D860 Sarcoidosis of lung: Secondary | ICD-10-CM | POA: Diagnosis not present

## 2014-12-17 DIAGNOSIS — J4550 Severe persistent asthma, uncomplicated: Secondary | ICD-10-CM | POA: Diagnosis not present

## 2015-01-07 DIAGNOSIS — J4540 Moderate persistent asthma, uncomplicated: Secondary | ICD-10-CM | POA: Diagnosis not present

## 2015-01-08 DIAGNOSIS — J4540 Moderate persistent asthma, uncomplicated: Secondary | ICD-10-CM | POA: Diagnosis not present

## 2015-02-04 DIAGNOSIS — J4540 Moderate persistent asthma, uncomplicated: Secondary | ICD-10-CM | POA: Diagnosis not present

## 2015-02-05 DIAGNOSIS — J4540 Moderate persistent asthma, uncomplicated: Secondary | ICD-10-CM | POA: Diagnosis not present

## 2015-02-11 DIAGNOSIS — E119 Type 2 diabetes mellitus without complications: Secondary | ICD-10-CM | POA: Diagnosis not present

## 2015-02-12 DIAGNOSIS — R938 Abnormal findings on diagnostic imaging of other specified body structures: Secondary | ICD-10-CM | POA: Diagnosis not present

## 2015-02-12 DIAGNOSIS — Z9181 History of falling: Secondary | ICD-10-CM | POA: Diagnosis not present

## 2015-02-12 DIAGNOSIS — Z0001 Encounter for general adult medical examination with abnormal findings: Secondary | ICD-10-CM | POA: Diagnosis not present

## 2015-02-17 DIAGNOSIS — Z1211 Encounter for screening for malignant neoplasm of colon: Secondary | ICD-10-CM | POA: Diagnosis not present

## 2015-02-18 DIAGNOSIS — R938 Abnormal findings on diagnostic imaging of other specified body structures: Secondary | ICD-10-CM | POA: Diagnosis not present

## 2015-02-18 DIAGNOSIS — R918 Other nonspecific abnormal finding of lung field: Secondary | ICD-10-CM | POA: Diagnosis not present

## 2015-02-18 DIAGNOSIS — Z8701 Personal history of pneumonia (recurrent): Secondary | ICD-10-CM | POA: Diagnosis not present

## 2015-03-04 DIAGNOSIS — J4540 Moderate persistent asthma, uncomplicated: Secondary | ICD-10-CM | POA: Diagnosis not present

## 2015-03-04 DIAGNOSIS — Z1231 Encounter for screening mammogram for malignant neoplasm of breast: Secondary | ICD-10-CM | POA: Diagnosis not present

## 2015-03-04 DIAGNOSIS — R922 Inconclusive mammogram: Secondary | ICD-10-CM | POA: Diagnosis not present

## 2015-03-05 DIAGNOSIS — J4540 Moderate persistent asthma, uncomplicated: Secondary | ICD-10-CM | POA: Diagnosis not present

## 2015-03-15 DIAGNOSIS — N3000 Acute cystitis without hematuria: Secondary | ICD-10-CM | POA: Diagnosis not present

## 2015-03-15 DIAGNOSIS — J209 Acute bronchitis, unspecified: Secondary | ICD-10-CM | POA: Diagnosis not present

## 2015-03-15 DIAGNOSIS — R938 Abnormal findings on diagnostic imaging of other specified body structures: Secondary | ICD-10-CM | POA: Diagnosis not present

## 2015-03-15 DIAGNOSIS — J0120 Acute ethmoidal sinusitis, unspecified: Secondary | ICD-10-CM | POA: Diagnosis not present

## 2015-03-20 ENCOUNTER — Encounter: Payer: Self-pay | Admitting: Internal Medicine

## 2015-03-23 DIAGNOSIS — J309 Allergic rhinitis, unspecified: Secondary | ICD-10-CM | POA: Diagnosis not present

## 2015-03-23 DIAGNOSIS — J4540 Moderate persistent asthma, uncomplicated: Secondary | ICD-10-CM | POA: Diagnosis not present

## 2015-03-25 DIAGNOSIS — M25562 Pain in left knee: Secondary | ICD-10-CM | POA: Diagnosis not present

## 2015-03-25 DIAGNOSIS — M25512 Pain in left shoulder: Secondary | ICD-10-CM | POA: Diagnosis not present

## 2015-03-25 DIAGNOSIS — G8929 Other chronic pain: Secondary | ICD-10-CM | POA: Diagnosis not present

## 2015-03-25 DIAGNOSIS — M25561 Pain in right knee: Secondary | ICD-10-CM | POA: Diagnosis not present

## 2015-04-01 ENCOUNTER — Encounter: Payer: Self-pay | Admitting: Internal Medicine

## 2015-04-01 DIAGNOSIS — J4540 Moderate persistent asthma, uncomplicated: Secondary | ICD-10-CM | POA: Diagnosis not present

## 2015-04-02 DIAGNOSIS — J4540 Moderate persistent asthma, uncomplicated: Secondary | ICD-10-CM | POA: Diagnosis not present

## 2015-04-10 DIAGNOSIS — Z0000 Encounter for general adult medical examination without abnormal findings: Secondary | ICD-10-CM | POA: Diagnosis not present

## 2015-04-10 DIAGNOSIS — G8929 Other chronic pain: Secondary | ICD-10-CM | POA: Diagnosis not present

## 2015-04-10 DIAGNOSIS — M25512 Pain in left shoulder: Secondary | ICD-10-CM | POA: Diagnosis not present

## 2015-04-29 DIAGNOSIS — J4540 Moderate persistent asthma, uncomplicated: Secondary | ICD-10-CM | POA: Diagnosis not present

## 2015-04-30 DIAGNOSIS — J4540 Moderate persistent asthma, uncomplicated: Secondary | ICD-10-CM | POA: Diagnosis not present

## 2015-05-11 ENCOUNTER — Encounter: Payer: Self-pay | Admitting: Internal Medicine

## 2015-05-15 DIAGNOSIS — I10 Essential (primary) hypertension: Secondary | ICD-10-CM | POA: Diagnosis not present

## 2015-05-15 DIAGNOSIS — E079 Disorder of thyroid, unspecified: Secondary | ICD-10-CM | POA: Diagnosis not present

## 2015-05-15 DIAGNOSIS — J45909 Unspecified asthma, uncomplicated: Secondary | ICD-10-CM | POA: Diagnosis not present

## 2015-05-15 DIAGNOSIS — E119 Type 2 diabetes mellitus without complications: Secondary | ICD-10-CM | POA: Diagnosis not present

## 2015-05-20 ENCOUNTER — Ambulatory Visit (AMBULATORY_SURGERY_CENTER): Payer: Self-pay | Admitting: *Deleted

## 2015-05-20 VITALS — Ht 61.0 in | Wt 150.6 lb

## 2015-05-20 DIAGNOSIS — Z86010 Personal history of colonic polyps: Secondary | ICD-10-CM

## 2015-05-20 MED ORDER — NA SULFATE-K SULFATE-MG SULF 17.5-3.13-1.6 GM/177ML PO SOLN: 1.0000 | Freq: Once | ORAL | Status: DC

## 2015-05-20 NOTE — Progress Notes (Signed)
No home 02 use No diet pills No egg or soy allergy No issues with past sedation

## 2015-05-25 DIAGNOSIS — H3531 Nonexudative age-related macular degeneration: Secondary | ICD-10-CM | POA: Diagnosis not present

## 2015-05-25 DIAGNOSIS — E119 Type 2 diabetes mellitus without complications: Secondary | ICD-10-CM | POA: Diagnosis not present

## 2015-05-25 DIAGNOSIS — H26493 Other secondary cataract, bilateral: Secondary | ICD-10-CM | POA: Diagnosis not present

## 2015-05-25 DIAGNOSIS — Z961 Presence of intraocular lens: Secondary | ICD-10-CM | POA: Diagnosis not present

## 2015-05-27 DIAGNOSIS — J4540 Moderate persistent asthma, uncomplicated: Secondary | ICD-10-CM | POA: Diagnosis not present

## 2015-05-28 DIAGNOSIS — J4540 Moderate persistent asthma, uncomplicated: Secondary | ICD-10-CM | POA: Diagnosis not present

## 2015-06-03 ENCOUNTER — Encounter: Payer: Self-pay | Admitting: Internal Medicine

## 2015-06-03 ENCOUNTER — Ambulatory Visit (AMBULATORY_SURGERY_CENTER): Payer: Medicare Other | Admitting: Internal Medicine

## 2015-06-03 VITALS — BP 142/69 | HR 64 | Temp 97.3°F | Resp 15 | Ht 61.0 in | Wt 150.0 lb

## 2015-06-03 DIAGNOSIS — D120 Benign neoplasm of cecum: Secondary | ICD-10-CM

## 2015-06-03 DIAGNOSIS — R197 Diarrhea, unspecified: Secondary | ICD-10-CM | POA: Diagnosis not present

## 2015-06-03 DIAGNOSIS — E119 Type 2 diabetes mellitus without complications: Secondary | ICD-10-CM | POA: Diagnosis not present

## 2015-06-03 DIAGNOSIS — K635 Polyp of colon: Secondary | ICD-10-CM | POA: Diagnosis not present

## 2015-06-03 DIAGNOSIS — K529 Noninfective gastroenteritis and colitis, unspecified: Secondary | ICD-10-CM | POA: Diagnosis not present

## 2015-06-03 DIAGNOSIS — Z86010 Personal history of colonic polyps: Secondary | ICD-10-CM | POA: Diagnosis not present

## 2015-06-03 DIAGNOSIS — I10 Essential (primary) hypertension: Secondary | ICD-10-CM | POA: Diagnosis not present

## 2015-06-03 LAB — GLUCOSE, CAPILLARY
GLUCOSE-CAPILLARY: 89 mg/dL (ref 65–99)
Glucose-Capillary: 88 mg/dL (ref 65–99)

## 2015-06-03 MED ORDER — SODIUM CHLORIDE 0.9 % IV SOLN: 500.0000 mL | INTRAVENOUS | Status: DC

## 2015-06-03 NOTE — Patient Instructions (Signed)
YOU HAD AN ENDOSCOPIC PROCEDURE TODAY AT THE [facility]:   Refer to the procedure report that was given to you for any specific questions about what was found during the examination.  If the procedure report does not answer your questions, please call your gastroenterologist to clarify.  If you requested that your care partner not be given the details of your procedure findings, then the procedure report has been included in a sealed envelope for you to review at your convenience later.  YOU SHOULD EXPECT: Some feelings of bloating in the abdomen. Passage of more gas than usual.  Walking can help get rid of the air that was put into your GI tract during the procedure and reduce the bloating. If you had a lower endoscopy (such as a colonoscopy or flexible sigmoidoscopy) you may notice spotting of blood in your stool or on the toilet paper. If you underwent a bowel prep for your procedure, you may not have a normal bowel movement for a few days.  Please Note:  You might notice some irritation and congestion in your nose or some drainage.  This is from the oxygen used during your procedure.  There is no need for concern and it should clear up in a day or so.  SYMPTOMS TO REPORT IMMEDIATELY:   Following lower endoscopy (colonoscopy or flexible sigmoidoscopy):  Excessive amounts of blood in the stool  Significant tenderness or worsening of abdominal pains  Swelling of the abdomen that is new, acute  Fever of 100F or higher   For urgent or emergent issues, a gastroenterologist can be reached at any hour by calling [phone].   DIET: Your first meal following the procedure should be a small meal and then it is ok to progress to your normal diet. Heavy or fried foods are harder to digest and may make you feel nauseous or bloated.  Likewise, meals heavy in dairy and vegetables can increase bloating.  Drink plenty of fluids but you should avoid alcoholic beverages for 24  hours.  ACTIVITY:  You should plan to take it easy for the rest of today and you should NOT DRIVE or use heavy machinery until tomorrow (because of the sedation medicines used during the test).    FOLLOW UP: Our staff will call the number listed on your records the next business day following your procedure to check on you and address any questions or concerns that you may have regarding the information given to you following your procedure. If we do not reach you, we will leave a message.  However, if you are feeling well and you are not experiencing any problems, there is no need to return our call.  We will assume that you have returned to your regular daily activities without incident.  If any biopsies were taken you will be contacted by phone or by letter within the next 1-3 weeks.  Please call us at [phone] if you have not heard about the biopsies in 3 weeks.    SIGNATURES/CONFIDENTIALITY: You and/or your care partner have signed paperwork which will be entered into your electronic medical record.  These signatures attest to the fact that that the information above on your After Visit Summary has been reviewed and is understood.  Full responsibility of the confidentiality of this discharge information lies with you and/or your care-partner. 

## 2015-06-03 NOTE — Progress Notes (Signed)
Called to room to assist during endoscopic procedure.  Patient ID and intended procedure confirmed with present staff. Received instructions for my participation in the procedure from the performing physician.  

## 2015-06-03 NOTE — Progress Notes (Signed)
Reviewed with patient Dr. Nichola Sizer recommendations for Dicyclomine, per procedure report and verbal review by Dr. Olevia Perches.

## 2015-06-03 NOTE — Op Note (Signed)
[place]  [address] [employer] [address]. [place], [address]   COLONOSCOPY PROCEDURE REPORT  PATIENT: [name], [name]  MR#: [id number] BIRTHDATE: 08-01-1946 , 68  yrs. old GENDER: female ENDOSCOPIST: Lafayette Dragon, MD REFERRED HT:[id number] Nicki Reaper, M.D. PROCEDURE DATE:  06/03/2015 PROCEDURE:   Colonoscopy, surveillance , Colonoscopy with biopsy, and Colonoscopy with snare polypectomy First Screening Colonoscopy - Avg.  risk and is 50 yrs.  old or older - No.  Prior Negative Screening - Now for repeat screening. N/A  History of Adenoma - Now for follow-up colonoscopy & has been > or = to 3 yrs.  Yes hx of adenoma.  Has been 3 or more years since last colonoscopy.  Polyps removed today? Yes ASA CLASS:   Class III INDICATIONS:Surveillance due to prior colonic neoplasia, Colorectal Neoplasm Risk Assessment for this procedure is average risk, and prior colonoscopy in May 2011.  2 adenomatous polyps removed. Patient has chronic diarrhea.  Treated for irritable bowel syndrome. MEDICATIONS: Monitored anesthesia care and Propofol 250 mg IV  DESCRIPTION OF PROCEDURE:   After the risks benefits and alternatives of the procedure were thoroughly explained, informed consent was obtained.  The digital rectal exam revealed no abnormalities of the rectum.   The LB [id number]-[id number]  endoscope was introduced through the anus and advanced to the cecum, which was identified by both the appendix and ileocecal valve. No adverse events experienced.   The quality of the prep was good.  (MoviPrep was used)  The instrument was then slowly withdrawn as the colon was fully examined. Estimated blood loss is zero unless otherwise noted in this procedure report.      COLON FINDINGS: A polypoid shaped sessile polyp measuring 6 mm in size was found at the cecum.  A polypectomy was performed with a cold snare.  The resection was complete, the polyp tissue was partially retrieved and sent to histology.    There was mild diverticulosis noted in the left colon with associated muscular hypertrophy, tortuosity and angulation. random biopsies of the colon were obtained to rule out microscopic colitis Retroflexed views revealed no abnormalities. The time to cecum = 5.17 Withdrawal time = 9.12   The scope was withdrawn and the procedure completed. COMPLICATIONS: There were no immediate complications.  ENDOSCOPIC IMPRESSION: 1.   Sessile polyp was found at the cecum; polypectomy was performed with a cold snare polyp not retrieved, biopsy of the postpolypectomy site obtained instead, 2.   There was mild diverticulosis noted in the left colon 3.random biopsies of the colon obtained to rule out microscopic colitis  RECOMMENDATIONS: 1.  Await pathology results 2.  Continue dicyclomine 10 mg 4 times a day for irritable bowel syndrome Recall colonoscopy pending path report  eSigned:  Lafayette Dragon, MD 06/03/2015 8:35 AM   cc:   PATIENT NAME:  [name] MR#: [id number]

## 2015-06-03 NOTE — Progress Notes (Signed)
Transferred to recovery room. A/O x3, pleased with MAC.  VSS.  Report to Karen, RN. 

## 2015-06-04 ENCOUNTER — Telehealth: Payer: Self-pay

## 2015-06-04 DIAGNOSIS — M65342 Trigger finger, left ring finger: Secondary | ICD-10-CM | POA: Diagnosis not present

## 2015-06-04 DIAGNOSIS — S66911A Strain of unspecified muscle, fascia and tendon at wrist and hand level, right hand, initial encounter: Secondary | ICD-10-CM | POA: Diagnosis not present

## 2015-06-04 NOTE — Telephone Encounter (Signed)
Left message on answering machine. 

## 2015-06-08 ENCOUNTER — Encounter: Payer: Self-pay | Admitting: Internal Medicine

## 2015-06-14 DIAGNOSIS — G4733 Obstructive sleep apnea (adult) (pediatric): Secondary | ICD-10-CM | POA: Diagnosis not present

## 2015-06-14 DIAGNOSIS — M545 Low back pain: Secondary | ICD-10-CM | POA: Diagnosis not present

## 2015-06-14 DIAGNOSIS — D649 Anemia, unspecified: Secondary | ICD-10-CM | POA: Diagnosis not present

## 2015-06-14 DIAGNOSIS — M810 Age-related osteoporosis without current pathological fracture: Secondary | ICD-10-CM | POA: Diagnosis not present

## 2015-06-24 DIAGNOSIS — J4540 Moderate persistent asthma, uncomplicated: Secondary | ICD-10-CM | POA: Diagnosis not present

## 2015-06-24 DIAGNOSIS — M65332 Trigger finger, left middle finger: Secondary | ICD-10-CM | POA: Diagnosis not present

## 2015-06-25 DIAGNOSIS — J4540 Moderate persistent asthma, uncomplicated: Secondary | ICD-10-CM | POA: Diagnosis not present

## 2015-07-15 DIAGNOSIS — R079 Chest pain, unspecified: Secondary | ICD-10-CM | POA: Diagnosis not present

## 2015-07-15 DIAGNOSIS — J45909 Unspecified asthma, uncomplicated: Secondary | ICD-10-CM | POA: Diagnosis not present

## 2015-07-15 DIAGNOSIS — E119 Type 2 diabetes mellitus without complications: Secondary | ICD-10-CM | POA: Diagnosis not present

## 2015-07-15 DIAGNOSIS — K589 Irritable bowel syndrome without diarrhea: Secondary | ICD-10-CM | POA: Diagnosis not present

## 2015-07-22 DIAGNOSIS — J4540 Moderate persistent asthma, uncomplicated: Secondary | ICD-10-CM | POA: Diagnosis not present

## 2015-07-23 DIAGNOSIS — J4540 Moderate persistent asthma, uncomplicated: Secondary | ICD-10-CM | POA: Diagnosis not present

## 2015-07-29 DIAGNOSIS — Z8709 Personal history of other diseases of the respiratory system: Secondary | ICD-10-CM | POA: Insufficient documentation

## 2015-07-29 DIAGNOSIS — J449 Chronic obstructive pulmonary disease, unspecified: Secondary | ICD-10-CM | POA: Insufficient documentation

## 2015-07-29 DIAGNOSIS — K219 Gastro-esophageal reflux disease without esophagitis: Secondary | ICD-10-CM

## 2015-07-29 HISTORY — DX: Personal history of other diseases of the respiratory system: Z87.09

## 2015-07-29 HISTORY — DX: Gastro-esophageal reflux disease without esophagitis: K21.9

## 2015-07-29 HISTORY — DX: Chronic obstructive pulmonary disease, unspecified: J44.9

## 2015-08-07 ENCOUNTER — Other Ambulatory Visit: Payer: Self-pay | Admitting: *Deleted

## 2015-08-07 MED ORDER — OMALIZUMAB 150 MG ~~LOC~~ SOLR
150.0000 mg | SUBCUTANEOUS | Status: DC
  Administered 2015-09-16 – 2021-05-12 (×73): 150 mg via SUBCUTANEOUS

## 2015-08-19 DIAGNOSIS — J0100 Acute maxillary sinusitis, unspecified: Secondary | ICD-10-CM | POA: Diagnosis not present

## 2015-08-19 DIAGNOSIS — E119 Type 2 diabetes mellitus without complications: Secondary | ICD-10-CM | POA: Diagnosis not present

## 2015-08-19 DIAGNOSIS — J4540 Moderate persistent asthma, uncomplicated: Secondary | ICD-10-CM | POA: Diagnosis not present

## 2015-08-20 DIAGNOSIS — J4540 Moderate persistent asthma, uncomplicated: Secondary | ICD-10-CM | POA: Diagnosis not present

## 2015-09-04 DIAGNOSIS — M26609 Unspecified temporomandibular joint disorder, unspecified side: Secondary | ICD-10-CM | POA: Diagnosis not present

## 2015-09-04 DIAGNOSIS — J310 Chronic rhinitis: Secondary | ICD-10-CM | POA: Diagnosis not present

## 2015-09-04 DIAGNOSIS — Z23 Encounter for immunization: Secondary | ICD-10-CM | POA: Diagnosis not present

## 2015-09-04 DIAGNOSIS — E119 Type 2 diabetes mellitus without complications: Secondary | ICD-10-CM | POA: Diagnosis not present

## 2015-09-16 ENCOUNTER — Ambulatory Visit (INDEPENDENT_AMBULATORY_CARE_PROVIDER_SITE_OTHER): Payer: Medicare Other

## 2015-09-16 DIAGNOSIS — J4550 Severe persistent asthma, uncomplicated: Secondary | ICD-10-CM

## 2015-09-17 DIAGNOSIS — J4550 Severe persistent asthma, uncomplicated: Secondary | ICD-10-CM | POA: Diagnosis not present

## 2015-09-23 ENCOUNTER — Other Ambulatory Visit: Payer: Self-pay | Admitting: Allergy and Immunology

## 2015-10-06 DIAGNOSIS — H9202 Otalgia, left ear: Secondary | ICD-10-CM | POA: Diagnosis not present

## 2015-10-06 DIAGNOSIS — J300 Vasomotor rhinitis: Secondary | ICD-10-CM | POA: Diagnosis not present

## 2015-10-15 ENCOUNTER — Ambulatory Visit (INDEPENDENT_AMBULATORY_CARE_PROVIDER_SITE_OTHER): Payer: Medicare Other

## 2015-10-15 DIAGNOSIS — J4540 Moderate persistent asthma, uncomplicated: Secondary | ICD-10-CM

## 2015-10-16 DIAGNOSIS — J4540 Moderate persistent asthma, uncomplicated: Secondary | ICD-10-CM | POA: Diagnosis not present

## 2015-10-26 ENCOUNTER — Encounter: Payer: Self-pay | Admitting: Allergy and Immunology

## 2015-10-26 ENCOUNTER — Ambulatory Visit (INDEPENDENT_AMBULATORY_CARE_PROVIDER_SITE_OTHER): Payer: Medicare Other | Admitting: Allergy and Immunology

## 2015-10-26 VITALS — BP 110/58 | HR 84 | Resp 24

## 2015-10-26 DIAGNOSIS — J387 Other diseases of larynx: Secondary | ICD-10-CM

## 2015-10-26 DIAGNOSIS — J4551 Severe persistent asthma with (acute) exacerbation: Secondary | ICD-10-CM | POA: Diagnosis not present

## 2015-10-26 DIAGNOSIS — H1010 Acute atopic conjunctivitis, unspecified eye: Secondary | ICD-10-CM

## 2015-10-26 DIAGNOSIS — K219 Gastro-esophageal reflux disease without esophagitis: Secondary | ICD-10-CM

## 2015-10-26 DIAGNOSIS — J309 Allergic rhinitis, unspecified: Secondary | ICD-10-CM | POA: Diagnosis not present

## 2015-10-26 MED ORDER — EPINEPHRINE 0.3 MG/0.3ML IJ SOAJ: INTRAMUSCULAR | Status: DC

## 2015-10-26 MED ORDER — FLUTICASONE PROPIONATE 50 MCG/ACT NA SUSP: NASAL | Status: DC

## 2015-10-26 MED ORDER — METHYLPREDNISOLONE ACETATE 80 MG/ML IJ SUSP
80.0000 mg | Freq: Once | INTRAMUSCULAR | Status: AC
  Administered 2015-10-26: 80 mg via INTRAMUSCULAR

## 2015-10-26 MED ORDER — AMOXICILLIN-POT CLAVULANATE 875-125 MG PO TABS: ORAL_TABLET | ORAL | Status: DC

## 2015-10-26 NOTE — Patient Instructions (Signed)
  1. Depomedrol 80 IM now  2. Augmentin 875 one tablet two times per day for 10 days.  3. Continue Xolair and Epi-Pen  4. Continue Dulera 200 two inhalations two times per day  5. Add Qvar 80 two inhalations two times per day to [facility] during flare up.  6. Continue omeprazole 40 twice a day + ranitidine 300 in evening  7. Continue nasal saline, ProAir HFA, Duoneb if needed.  8. Return in 6 months or earlier if problem.

## 2015-10-26 NOTE — Progress Notes (Signed)
[facility] [place]  Follow-up Note  Refering Provider: Myer Peer, MD Primary Provider: Ann Held, MD  Subjective:   [name] is a 69 y.o. female who returns to the [facility] [place] in re-evaluation of the following:  HPI Comments:  [name] returns to this clinic on 10/26/2015 in reevaluation of her severe asthma treated with omalizumab, allergic rhinitis, and laryngopharyngeal reflux. She's done quite well since I last seen her in this clinic in July but unfortunately over the course of the past 10 days she's developed sneezing and head congestion and runny nose with ugly postnasal drip and blood along with shortness of breath and wheezing and coughing and phlegm production and using her bronchodilator 3 times per day. She has has left ear pain and jaw pain. She has been around individuals who've had a cold. Prior to this point in time she did not require any systemic steroids for an exacerbation of her asthma and did not use her bronchodilator very often while continuing on a combination of Xolair and Dulera and therapy for reflux.     Outpatient Encounter Prescriptions as of 10/26/2015  Medication Sig  . albuterol (PROAIR HFA) 108 (90 BASE) MCG/ACT inhaler Inhale 2 puffs into the lungs as needed for wheezing.  Marland Kitchen amoxicillin-clavulanate (AUGMENTIN) 875-125 MG tablet Take one tablet twice daily for 10 days  . atorvastatin (LIPITOR) 20 MG tablet Take 20 mg by mouth daily.  Marland Kitchen azelastine (ASTELIN) 0.1 % nasal spray   . beclomethasone (QVAR) 80 MCG/ACT inhaler Inhale 2 puffs into the lungs daily.    . Calcium Polycarbophil (EQUALACTIN PO) Take 1 capsule by mouth daily.  . citalopram (CELEXA) 20 MG tablet Take 20 mg by mouth daily.    Marland Kitchen EPINEPHrine 0.3 mg/0.3 mL IJ SOAJ injection USE AS DIRECTED FOR LIFE THREATENING ALLERGIC REACTION  . fluticasone (FLONASE) 50 MCG/ACT nasal spray Use 1-2 sprays in each nostril once  daily for stuffy nose or drainage  . hydrochlorothiazide (HYDRODIURIL) 12.5 MG tablet Take 12.5 mg by mouth daily.  . hydrocortisone (PROCTOCORT) 1 % CREA Apply to rectum three times daily as needed (Patient not taking: Reported on 05/20/2015)  . Ibuprofen 200 MG CAPS Take by mouth as needed.    Marland Kitchen ipratropium-albuterol (DUONEB) 0.5-2.5 (3) MG/3ML SOLN Take 3 mLs by nebulization every 6 (six) hours as needed.  . Liraglutide (VICTOZA [place]) Inject into the skin. 0.6 every night  . losartan (COZAAR) 100 MG tablet Take 100 mg by mouth daily.  . mometasone-formoterol (DULERA) 200-5 MCG/ACT AERO Inhale 2 puffs into the lungs 2 (two) times daily.  Marland Kitchen omeprazole (PRILOSEC) 40 MG capsule TAKE ONE CAPSULE BY MOUTH TWICE DAILY  . pioglitazone (ACTOS) 30 MG tablet Take 30 mg by mouth daily.  . Probiotic Product (PROBIOTIC DAILY PO) Take 1 capsule by mouth daily.  . ranitidine (ZANTAC) 300 MG tablet Take 300 mg by mouth at bedtime.  . [DISCONTINUED] dicyclomine (BENTYL) 20 MG tablet Take 1 tablet (20 mg total) by mouth 2 (two) times daily.  . [DISCONTINUED] FLUTICASONE PROPIONATE, NASAL, NA Place 2 puffs into the nose daily.   Facility-Administered Encounter Medications as of 10/26/2015  Medication  . omalizumab [name] Right) injection 150 mg    Meds ordered this encounter  Medications  . amoxicillin-clavulanate (AUGMENTIN) 875-125 MG tablet    Sig: Take one tablet twice daily for 10 days    Dispense:  20 tablet    Refill:  0  .  fluticasone (FLONASE) 50 MCG/ACT nasal spray    Sig: Use 1-2 sprays in each nostril once daily for stuffy nose or drainage    Dispense:  16 g    Refill:  5  . EPINEPHrine 0.3 mg/0.3 mL IJ SOAJ injection    Sig: USE AS DIRECTED FOR LIFE THREATENING ALLERGIC REACTION    Dispense:  2 Device    Refill:  3    Past Medical History  Diagnosis Date  . Unspecified hearing loss   . Esophagitis, unspecified   . Diaphragmatic hernia without mention of obstruction or gangrene   .  Incontinence of feces   . Unspecified essential hypertension   . Unspecified asthma(493.90)   . Sarcoidosis ([place])   . Irritable bowel syndrome   . Esophageal reflux   . H/O adenomatous polyp of colon 2011  . Anxiety   . DM (diabetes mellitus) ([place])   . Depression   . Status post dilation of esophageal narrowing   . Kidney stones   . Sleep apnea     no cpap now  . Allergy   . Cataract     bilateral and removed  . Hyperlipidemia     Past Surgical History  Procedure Laterality Date  . Partial hysterectomy    . Tonsillectomy and adenoidectomy    . Nasal septum surgery    . Tubal ligation    . Hemorrhoid surgery    . Bladder surgery    . Cataract extraction Bilateral     Allergies  Allergen Reactions  . Codeine Nausea And Vomiting  . Sulfa Antibiotics Other (See Comments)    Allergic per pt's mother     Review of Systems  Constitutional: Negative.   HENT: Positive for congestion, ear pain, sinus pressure and sneezing.   Eyes: Negative.   Respiratory: Positive for cough, shortness of breath and wheezing.   Cardiovascular: Negative.   Gastrointestinal: Negative.   Musculoskeletal: Negative.   Skin: Negative.   Neurological: Negative for dizziness and headaches.  Hematological: Negative.      Objective:   Filed Vitals:   10/26/15 1047  BP: 110/58  Pulse: 84  Resp: 24          Physical Exam  Constitutional: She appears well-developed and well-nourished. No distress.  Coughing, nasal voice  HENT:  Head: Normocephalic and atraumatic. Not macrocephalic. Head is without right periorbital erythema and without left periorbital erythema.  Right Ear: Tympanic membrane, external ear and ear canal normal. No drainage or tenderness. No foreign bodies. Tympanic membrane is not injected, not scarred, not perforated, not erythematous, not retracted and not bulging. No middle ear effusion.  Left Ear: Tympanic membrane, external ear and ear canal normal. No drainage or  tenderness. No foreign bodies. Tympanic membrane is not injected, not scarred, not perforated, not erythematous, not retracted and not bulging.  No middle ear effusion (Dull light reflex of the left tympanic membrane).  Nose: Mucosal edema present. No rhinorrhea, nose lacerations or sinus tenderness.  No foreign bodies.  Mouth/Throat: Oropharynx is clear and moist. No oropharyngeal exudate, posterior oropharyngeal edema, posterior oropharyngeal erythema or tonsillar abscesses.  Eyes: Lids are normal. Right eye exhibits no chemosis, no discharge and no exudate. No foreign body present in the right eye. Left eye exhibits no chemosis, no discharge and no exudate. No foreign body present in the left eye. Right conjunctiva is not injected. Left conjunctiva is not injected.  Neck: Neck supple. No tracheal tenderness present. No tracheal deviation and no edema present.  No thyroid mass and no thyromegaly present.  Cardiovascular: Normal rate, regular rhythm, S1 normal and S2 normal.  Exam reveals no gallop.   No murmur heard. Pulmonary/Chest: No accessory muscle usage or stridor. No respiratory distress. She has wheezes (nd expiratory wheezes in all lung fields). She has no rhonchi. She has no rales.  Abdominal: Soft.  Lymphadenopathy:       Head (right side): No tonsillar adenopathy present.       Head (left side): No tonsillar adenopathy present.    She has no cervical adenopathy.  Neurological: She is alert.  Skin: No rash noted. She is not diaphoretic.  Psychiatric: She has a normal mood and affect. Her behavior is normal.    Diagnostics:    Spirometry was performed and demonstrated an FEV1 of 2.96 at 85 % of predicted.  The patient had an Asthma Control Test with the following results: ACT Total Score: 14.    Assessment and Plan:   1. Severe persistent asthma, with acute exacerbation   2. LPRD (laryngopharyngeal reflux disease)   3. Allergic rhinoconjunctivitis      1. Depomedrol 80 IM  now  2. Augmentin 875 one tablet two times per day for 10 days.  3. Continue Xolair and Epi-Pen  4. Continue Dulera 200 two inhalations two times per day  5. Add Qvar 80 two inhalations two times per day to [facility] during flare up.  6. Continue omeprazole 40 twice a day + ranitidine 300 in evening  7. Continue nasal saline, ProAir HFA, Duoneb if needed.  8. Return in 6 months or earlier if problem.  I treated Mikesha is asthma exacerbation most likely triggered by an infectious disease of her upper airway with the therapy mentioned above and I will see her back in this clinic pending her response. If she does well she can return to this clinic in 6 months.   Allena Katz, MD [place]

## 2015-10-29 ENCOUNTER — Telehealth: Payer: Self-pay | Admitting: *Deleted

## 2015-10-29 NOTE — Telephone Encounter (Signed)
PT CALLED WANTING TO KNOW IF WE HAVE ANY SAMPLES OF QVAR 80 THAT SHE CAN HAVE.

## 2015-11-16 ENCOUNTER — Ambulatory Visit (INDEPENDENT_AMBULATORY_CARE_PROVIDER_SITE_OTHER): Payer: Medicare Other | Admitting: *Deleted

## 2015-11-16 DIAGNOSIS — J4540 Moderate persistent asthma, uncomplicated: Secondary | ICD-10-CM | POA: Diagnosis not present

## 2015-11-17 DIAGNOSIS — J4540 Moderate persistent asthma, uncomplicated: Secondary | ICD-10-CM | POA: Diagnosis not present

## 2015-12-02 DIAGNOSIS — R062 Wheezing: Secondary | ICD-10-CM | POA: Diagnosis not present

## 2015-12-02 DIAGNOSIS — B9789 Other viral agents as the cause of diseases classified elsewhere: Secondary | ICD-10-CM | POA: Diagnosis not present

## 2015-12-02 DIAGNOSIS — J069 Acute upper respiratory infection, unspecified: Secondary | ICD-10-CM | POA: Diagnosis not present

## 2015-12-14 ENCOUNTER — Ambulatory Visit (INDEPENDENT_AMBULATORY_CARE_PROVIDER_SITE_OTHER): Payer: Medicare Other

## 2015-12-14 DIAGNOSIS — J4540 Moderate persistent asthma, uncomplicated: Secondary | ICD-10-CM

## 2015-12-15 DIAGNOSIS — J4540 Moderate persistent asthma, uncomplicated: Secondary | ICD-10-CM | POA: Diagnosis not present

## 2015-12-30 DIAGNOSIS — J4551 Severe persistent asthma with (acute) exacerbation: Secondary | ICD-10-CM | POA: Diagnosis not present

## 2016-01-11 ENCOUNTER — Ambulatory Visit (INDEPENDENT_AMBULATORY_CARE_PROVIDER_SITE_OTHER): Payer: Medicare Other

## 2016-01-11 DIAGNOSIS — J4540 Moderate persistent asthma, uncomplicated: Secondary | ICD-10-CM

## 2016-01-12 DIAGNOSIS — J4540 Moderate persistent asthma, uncomplicated: Secondary | ICD-10-CM | POA: Diagnosis not present

## 2016-01-23 ENCOUNTER — Other Ambulatory Visit: Payer: Self-pay | Admitting: Allergy and Immunology

## 2016-02-08 ENCOUNTER — Ambulatory Visit (INDEPENDENT_AMBULATORY_CARE_PROVIDER_SITE_OTHER): Payer: Medicare Other | Admitting: *Deleted

## 2016-02-08 DIAGNOSIS — J4540 Moderate persistent asthma, uncomplicated: Secondary | ICD-10-CM

## 2016-02-08 DIAGNOSIS — E119 Type 2 diabetes mellitus without complications: Secondary | ICD-10-CM | POA: Diagnosis not present

## 2016-02-09 DIAGNOSIS — J4540 Moderate persistent asthma, uncomplicated: Secondary | ICD-10-CM | POA: Diagnosis not present

## 2016-02-15 DIAGNOSIS — E119 Type 2 diabetes mellitus without complications: Secondary | ICD-10-CM | POA: Diagnosis not present

## 2016-02-15 DIAGNOSIS — Z13820 Encounter for screening for osteoporosis: Secondary | ICD-10-CM | POA: Diagnosis not present

## 2016-02-15 DIAGNOSIS — M898X9 Other specified disorders of bone, unspecified site: Secondary | ICD-10-CM | POA: Diagnosis not present

## 2016-02-15 DIAGNOSIS — M859 Disorder of bone density and structure, unspecified: Secondary | ICD-10-CM | POA: Diagnosis not present

## 2016-02-15 DIAGNOSIS — J45909 Unspecified asthma, uncomplicated: Secondary | ICD-10-CM | POA: Diagnosis not present

## 2016-02-29 DIAGNOSIS — M810 Age-related osteoporosis without current pathological fracture: Secondary | ICD-10-CM | POA: Diagnosis not present

## 2016-03-07 ENCOUNTER — Ambulatory Visit (INDEPENDENT_AMBULATORY_CARE_PROVIDER_SITE_OTHER): Payer: Medicare Other

## 2016-03-07 DIAGNOSIS — J4540 Moderate persistent asthma, uncomplicated: Secondary | ICD-10-CM

## 2016-03-08 DIAGNOSIS — J4540 Moderate persistent asthma, uncomplicated: Secondary | ICD-10-CM

## 2016-03-08 DIAGNOSIS — M810 Age-related osteoporosis without current pathological fracture: Secondary | ICD-10-CM | POA: Diagnosis not present

## 2016-03-16 DIAGNOSIS — Z1231 Encounter for screening mammogram for malignant neoplasm of breast: Secondary | ICD-10-CM | POA: Diagnosis not present

## 2016-03-23 ENCOUNTER — Other Ambulatory Visit: Payer: Self-pay | Admitting: Allergy and Immunology

## 2016-04-04 ENCOUNTER — Ambulatory Visit (INDEPENDENT_AMBULATORY_CARE_PROVIDER_SITE_OTHER): Payer: Medicare Other

## 2016-04-04 DIAGNOSIS — J4540 Moderate persistent asthma, uncomplicated: Secondary | ICD-10-CM | POA: Diagnosis not present

## 2016-04-05 DIAGNOSIS — J4540 Moderate persistent asthma, uncomplicated: Secondary | ICD-10-CM | POA: Diagnosis not present

## 2016-04-05 DIAGNOSIS — T1490 Injury, unspecified: Secondary | ICD-10-CM | POA: Diagnosis not present

## 2016-04-06 DIAGNOSIS — Z1389 Encounter for screening for other disorder: Secondary | ICD-10-CM | POA: Diagnosis not present

## 2016-04-06 DIAGNOSIS — M65332 Trigger finger, left middle finger: Secondary | ICD-10-CM | POA: Diagnosis not present

## 2016-04-06 DIAGNOSIS — M19042 Primary osteoarthritis, left hand: Secondary | ICD-10-CM | POA: Diagnosis not present

## 2016-04-06 DIAGNOSIS — M19041 Primary osteoarthritis, right hand: Secondary | ICD-10-CM | POA: Diagnosis not present

## 2016-04-20 DIAGNOSIS — M180 Bilateral primary osteoarthritis of first carpometacarpal joints: Secondary | ICD-10-CM | POA: Diagnosis not present

## 2016-04-20 DIAGNOSIS — M65342 Trigger finger, left ring finger: Secondary | ICD-10-CM | POA: Diagnosis not present

## 2016-04-20 DIAGNOSIS — M19041 Primary osteoarthritis, right hand: Secondary | ICD-10-CM | POA: Diagnosis not present

## 2016-04-20 DIAGNOSIS — M65321 Trigger finger, right index finger: Secondary | ICD-10-CM | POA: Diagnosis not present

## 2016-04-20 DIAGNOSIS — M19042 Primary osteoarthritis, left hand: Secondary | ICD-10-CM | POA: Diagnosis not present

## 2016-04-20 DIAGNOSIS — M6530 Trigger finger, unspecified finger: Secondary | ICD-10-CM

## 2016-04-20 HISTORY — DX: Trigger finger, unspecified finger: M65.30

## 2016-04-21 DIAGNOSIS — M25531 Pain in right wrist: Secondary | ICD-10-CM | POA: Diagnosis not present

## 2016-04-21 DIAGNOSIS — M25571 Pain in right ankle and joints of right foot: Secondary | ICD-10-CM | POA: Diagnosis not present

## 2016-04-21 DIAGNOSIS — S52599A Other fractures of lower end of unspecified radius, initial encounter for closed fracture: Secondary | ICD-10-CM | POA: Diagnosis not present

## 2016-04-26 ENCOUNTER — Other Ambulatory Visit: Payer: Self-pay | Admitting: Orthopedic Surgery

## 2016-04-28 ENCOUNTER — Encounter: Payer: Self-pay | Admitting: Allergy and Immunology

## 2016-04-28 ENCOUNTER — Ambulatory Visit (INDEPENDENT_AMBULATORY_CARE_PROVIDER_SITE_OTHER): Payer: Medicare Other | Admitting: Allergy and Immunology

## 2016-04-28 VITALS — BP 128/80 | HR 80 | Resp 16

## 2016-04-28 DIAGNOSIS — J387 Other diseases of larynx: Secondary | ICD-10-CM

## 2016-04-28 DIAGNOSIS — J3089 Other allergic rhinitis: Secondary | ICD-10-CM

## 2016-04-28 DIAGNOSIS — J4550 Severe persistent asthma, uncomplicated: Secondary | ICD-10-CM | POA: Diagnosis not present

## 2016-04-28 DIAGNOSIS — K219 Gastro-esophageal reflux disease without esophagitis: Secondary | ICD-10-CM

## 2016-04-28 NOTE — Patient Instructions (Signed)
  1. Continue Xolair and Epi-Pen  2. Continue Dulera 200 two inhalations two times per day  3. Add Qvar 80 two inhalations two times per day to [facility] during flare up.  4. Continue omeprazole 40 twice a day + ranitidine 300 in evening  5. Continue nasal saline, ProAir HFA, Duoneb if needed.  6. Return in 6 months or earlier if problem.

## 2016-04-28 NOTE — Progress Notes (Signed)
Follow-up Note  Referring Provider: Myer Peer, MD Primary Provider: Ann Held, MD Date of Office Visit: 04/28/2016  Subjective:   [name] (DOB: 06-23-1946) is a 70 y.o. female who returns to the [facility] and [place] on 04/28/2016 in re-evaluation of the following:  HPI: [name] returns to this clinic in reevaluation of her severe asthma treated with omalizumab, allergic rhinitis, and LPR. I last saw her in his clinic in December 2016.  She has not had an exacerbation of her lung condition and has not required a systemic steroid over the past 6 months. Her nose is been doing quite well and she's not had any episodes of sinusitis requiring an antibiotic. Her reflux has been under excellent control and she's had very little problems with her throat. She does not use a short acting bronchodilator at this point but does consistently use her Dulera. She has not had to add Qvar to her [facility] as part of her action plan.  Onnika had a fall at her house last week and appeared to have fractured her left forearm and strained her right forearm and had significant trauma to her left leg and is presently using some hydrocodone.    Medication List           atorvastatin 20 MG tablet  Commonly known as:  LIPITOR  Take 20 mg by mouth daily.     azelastine 0.1 % nasal spray  Commonly known as:  ASTELIN     citalopram 20 MG tablet  Commonly known as:  CELEXA  Take 20 mg by mouth daily.     DULERA 200-5 MCG/ACT Aero  Generic drug:  mometasone-formoterol  Inhale 2 puffs into the lungs 2 (two) times daily.     EPINEPHrine 0.3 mg/0.3 mL Soaj injection  Commonly known as:  EPI-PEN  USE AS DIRECTED FOR LIFE THREATENING ALLERGIC REACTION     EQUALACTIN PO  Take 1 capsule by mouth daily.     fluticasone 50 MCG/ACT nasal spray  Commonly known as:  FLONASE  Use 1-2 sprays in each nostril once daily for stuffy nose or drainage     hydrochlorothiazide 12.5 MG tablet    Commonly known as:  HYDRODIURIL  Take 12.5 mg by mouth daily.     HYDROcodone-acetaminophen 5-325 MG tablet  Commonly known as:  NORCO/VICODIN  Take 1 tablet by mouth as needed.     hydrocortisone 1 % Crea  Commonly known as:  PROCTOCORT  Apply to rectum three times daily as needed     Ibuprofen 200 MG Caps  Take by mouth as needed.     ipratropium-albuterol 0.5-2.5 (3) MG/3ML Soln  Commonly known as:  DUONEB  Take 3 mLs by nebulization every 6 (six) hours as needed.     losartan 100 MG tablet  Commonly known as:  COZAAR  Take 100 mg by mouth daily.     omeprazole 40 MG capsule  Commonly known as:  PRILOSEC  TAKE ONE CAPSULE BY MOUTH TWICE DAILY     pioglitazone 30 MG tablet  Commonly known as:  ACTOS  Take 30 mg by mouth daily.     PROAIR HFA 108 (90 Base) MCG/ACT inhaler  Generic drug:  albuterol  Inhale 2 puffs into the lungs as needed for wheezing.     PROBIOTIC DAILY PO  Take 1 capsule by mouth daily.     QVAR 80 MCG/ACT inhaler  Generic drug:  beclomethasone  Inhale 2 puffs into the  lungs daily.     ranitidine 300 MG tablet  Commonly known as:  ZANTAC  Take 300 mg by mouth at bedtime.     VICTOZA [place]  Inject into the skin. 0.6 every night        Past Medical History  Diagnosis Date  . Unspecified hearing loss   . Esophagitis, unspecified   . Diaphragmatic hernia without mention of obstruction or gangrene   . Incontinence of feces   . Unspecified essential hypertension   . Unspecified asthma(493.90)   . Sarcoidosis ([place])   . Irritable bowel syndrome   . Esophageal reflux   . H/O adenomatous polyp of colon 2011  . Anxiety   . DM (diabetes mellitus) ([place])   . Depression   . Status post dilation of esophageal narrowing   . Kidney stones   . Sleep apnea     no cpap now  . Allergy   . Cataract     bilateral and removed  . Hyperlipidemia     Past Surgical History  Procedure Laterality Date  . Partial hysterectomy    . Tonsillectomy and  adenoidectomy    . Nasal septum surgery    . Tubal ligation    . Hemorrhoid surgery    . Bladder surgery    . Cataract extraction Bilateral     Allergies  Allergen Reactions  . Codeine Nausea And Vomiting  . Sulfa Antibiotics Other (See Comments)    Allergic per pt's mother     Review of systems negative except as noted in HPI / PMHx or noted below:  Review of Systems  Constitutional: Negative.   HENT: Negative.   Eyes: Negative.   Respiratory: Negative.   Cardiovascular: Negative.   Gastrointestinal: Negative.   Genitourinary: Negative.   Musculoskeletal: Negative.   Skin: Negative.   Neurological: Negative.   Endo/Heme/Allergies: Negative.   Psychiatric/Behavioral: Negative.      Objective:   Filed Vitals:   04/28/16 1027  BP: 128/80  Pulse: 80  Resp: 16          Physical Exam  Constitutional: She is well-developed, well-nourished, and in no distress.  HENT:  Head: Normocephalic.  Right Ear: Tympanic membrane, external ear and ear canal normal.  Left Ear: Tympanic membrane, external ear and ear canal normal.  Nose: Nose normal. No mucosal edema or rhinorrhea.  Mouth/Throat: Uvula is midline, oropharynx is clear and moist and mucous membranes are normal. No oropharyngeal exudate.  Eyes: Conjunctivae are normal.  Neck: Trachea normal. No tracheal tenderness present. No tracheal deviation present. No thyromegaly present.  Cardiovascular: Normal rate, regular rhythm, S1 normal, S2 normal and normal heart sounds.   No murmur heard. Pulmonary/Chest: Breath sounds normal. No stridor. No respiratory distress. She has no wheezes. She has no rales.  Musculoskeletal: She exhibits no edema.  Bilateral forearm and hand braces. Ecchymosis left interior shin.  Lymphadenopathy:       Head (right side): No tonsillar adenopathy present.       Head (left side): No tonsillar adenopathy present.    She has no cervical adenopathy.  Neurological: She is alert. Gait normal.    Skin: No rash noted. She is not diaphoretic. No erythema. Nails show no clubbing.  Psychiatric: Mood and affect normal.    Diagnostics:    Spirometry was performed and demonstrated an FEV1 of 1.13 at 59 % of predicted.  The patient had an Asthma Control Test with the following results:  .    Assessment and Plan:  1. Severe persistent asthma, uncomplicated   2. LPRD (laryngopharyngeal reflux disease)   3. Other allergic rhinitis     1. Continue Xolair and Epi-Pen  2. Continue Dulera 200 two inhalations two times per day  3. Add Qvar 80 two inhalations two times per day to [facility] during flare up.  4. Continue omeprazole 40 twice a day + ranitidine 300 in evening  5. Continue nasal saline, ProAir HFA, Duoneb if needed.  6. Return in 6 months or earlier if problem.  Fredricka has had an excellent 6 month interval and I see no need for changing her medical therapy at this point and she will continue to use omalizumab along with [facility] and therapy for her reflux as noted above. I will see her back in this clinic in 6 months or earlier if there is a problem.  Allena Katz, MD [place]

## 2016-04-29 DIAGNOSIS — J4540 Moderate persistent asthma, uncomplicated: Secondary | ICD-10-CM | POA: Diagnosis not present

## 2016-05-02 ENCOUNTER — Ambulatory Visit (INDEPENDENT_AMBULATORY_CARE_PROVIDER_SITE_OTHER): Payer: Medicare Other | Admitting: *Deleted

## 2016-05-02 DIAGNOSIS — J4540 Moderate persistent asthma, uncomplicated: Secondary | ICD-10-CM

## 2016-05-06 ENCOUNTER — Encounter (HOSPITAL_BASED_OUTPATIENT_CLINIC_OR_DEPARTMENT_OTHER): Payer: Self-pay | Admitting: *Deleted

## 2016-05-06 DIAGNOSIS — M6530 Trigger finger, unspecified finger: Secondary | ICD-10-CM | POA: Diagnosis not present

## 2016-05-09 ENCOUNTER — Encounter (HOSPITAL_BASED_OUTPATIENT_CLINIC_OR_DEPARTMENT_OTHER)
Admission: RE | Admit: 2016-05-09 | Discharge: 2016-05-09 | Disposition: A | Discharge status: Home / Self Care | Payer: Medicare Other | Source: Ambulatory Visit | Attending: Orthopedic Surgery | Admitting: Orthopedic Surgery

## 2016-05-09 DIAGNOSIS — Z79899 Other long term (current) drug therapy: Secondary | ICD-10-CM | POA: Diagnosis not present

## 2016-05-09 DIAGNOSIS — Z7984 Long term (current) use of oral hypoglycemic drugs: Secondary | ICD-10-CM | POA: Diagnosis not present

## 2016-05-09 DIAGNOSIS — I10 Essential (primary) hypertension: Secondary | ICD-10-CM | POA: Diagnosis not present

## 2016-05-09 DIAGNOSIS — Z7951 Long term (current) use of inhaled steroids: Secondary | ICD-10-CM | POA: Diagnosis not present

## 2016-05-09 DIAGNOSIS — J449 Chronic obstructive pulmonary disease, unspecified: Secondary | ICD-10-CM | POA: Diagnosis not present

## 2016-05-09 DIAGNOSIS — M65321 Trigger finger, right index finger: Secondary | ICD-10-CM | POA: Diagnosis not present

## 2016-05-09 DIAGNOSIS — E785 Hyperlipidemia, unspecified: Secondary | ICD-10-CM | POA: Diagnosis not present

## 2016-05-09 DIAGNOSIS — F329 Major depressive disorder, single episode, unspecified: Secondary | ICD-10-CM | POA: Diagnosis not present

## 2016-05-09 DIAGNOSIS — K219 Gastro-esophageal reflux disease without esophagitis: Secondary | ICD-10-CM | POA: Diagnosis not present

## 2016-05-09 DIAGNOSIS — E119 Type 2 diabetes mellitus without complications: Secondary | ICD-10-CM | POA: Diagnosis not present

## 2016-05-09 DIAGNOSIS — D869 Sarcoidosis, unspecified: Secondary | ICD-10-CM | POA: Diagnosis not present

## 2016-05-09 DIAGNOSIS — G4730 Sleep apnea, unspecified: Secondary | ICD-10-CM | POA: Diagnosis not present

## 2016-05-09 DIAGNOSIS — M65342 Trigger finger, left ring finger: Secondary | ICD-10-CM | POA: Diagnosis not present

## 2016-05-09 LAB — BASIC METABOLIC PANEL
Anion gap: 9 (ref 5–15)
BUN: 29 mg/dL — ABNORMAL HIGH (ref 6–20)
CO2: 24 mmol/L (ref 22–32)
Calcium: 9.1 mg/dL (ref 8.9–10.3)
Chloride: 105 mmol/L (ref 101–111)
Creatinine, Ser: 0.86 mg/dL (ref 0.44–1.00)
GFR calc Af Amer: >60 mL/min (ref >60)
GFR calc non Af Amer: >60 mL/min (ref >60)
Glucose, Bld: 119 mg/dL — ABNORMAL HIGH (ref 65–99)
Potassium: 3.9 mmol/L (ref 3.5–5.1)
Sodium: 138 mmol/L (ref 135–145)

## 2016-05-12 ENCOUNTER — Encounter (HOSPITAL_BASED_OUTPATIENT_CLINIC_OR_DEPARTMENT_OTHER): Payer: Self-pay | Admitting: Certified Registered"

## 2016-05-12 ENCOUNTER — Encounter (HOSPITAL_BASED_OUTPATIENT_CLINIC_OR_DEPARTMENT_OTHER)
Admission: RE | Disposition: A | Discharge status: Home / Self Care | Payer: Self-pay | Source: Ambulatory Visit | Attending: Orthopedic Surgery

## 2016-05-12 ENCOUNTER — Ambulatory Visit (HOSPITAL_BASED_OUTPATIENT_CLINIC_OR_DEPARTMENT_OTHER): Payer: Medicare Other | Admitting: Certified Registered"

## 2016-05-12 ENCOUNTER — Ambulatory Visit (HOSPITAL_BASED_OUTPATIENT_CLINIC_OR_DEPARTMENT_OTHER)
Admission: RE | Admit: 2016-05-12 | Discharge: 2016-05-12 | Disposition: A | Discharge status: Home / Self Care | Payer: Medicare Other | Source: Ambulatory Visit | Attending: Orthopedic Surgery | Admitting: Orthopedic Surgery

## 2016-05-12 DIAGNOSIS — M65342 Trigger finger, left ring finger: Secondary | ICD-10-CM | POA: Insufficient documentation

## 2016-05-12 DIAGNOSIS — F329 Major depressive disorder, single episode, unspecified: Secondary | ICD-10-CM | POA: Insufficient documentation

## 2016-05-12 DIAGNOSIS — I10 Essential (primary) hypertension: Secondary | ICD-10-CM | POA: Diagnosis not present

## 2016-05-12 DIAGNOSIS — Z7951 Long term (current) use of inhaled steroids: Secondary | ICD-10-CM | POA: Insufficient documentation

## 2016-05-12 DIAGNOSIS — M65832 Other synovitis and tenosynovitis, left forearm: Secondary | ICD-10-CM | POA: Diagnosis not present

## 2016-05-12 DIAGNOSIS — E785 Hyperlipidemia, unspecified: Secondary | ICD-10-CM | POA: Insufficient documentation

## 2016-05-12 DIAGNOSIS — J449 Chronic obstructive pulmonary disease, unspecified: Secondary | ICD-10-CM | POA: Insufficient documentation

## 2016-05-12 DIAGNOSIS — M65321 Trigger finger, right index finger: Secondary | ICD-10-CM | POA: Insufficient documentation

## 2016-05-12 DIAGNOSIS — E119 Type 2 diabetes mellitus without complications: Secondary | ICD-10-CM | POA: Diagnosis not present

## 2016-05-12 DIAGNOSIS — D869 Sarcoidosis, unspecified: Secondary | ICD-10-CM | POA: Diagnosis not present

## 2016-05-12 DIAGNOSIS — K219 Gastro-esophageal reflux disease without esophagitis: Secondary | ICD-10-CM | POA: Insufficient documentation

## 2016-05-12 DIAGNOSIS — Z7984 Long term (current) use of oral hypoglycemic drugs: Secondary | ICD-10-CM | POA: Insufficient documentation

## 2016-05-12 DIAGNOSIS — Z79899 Other long term (current) drug therapy: Secondary | ICD-10-CM | POA: Insufficient documentation

## 2016-05-12 DIAGNOSIS — G4730 Sleep apnea, unspecified: Secondary | ICD-10-CM | POA: Insufficient documentation

## 2016-05-12 HISTORY — PX: TRIGGER FINGER RELEASE: SHX641

## 2016-05-12 LAB — GLUCOSE, CAPILLARY
GLUCOSE-CAPILLARY: 109 mg/dL — AB (ref 65–99)
Glucose-Capillary: 109 mg/dL — ABNORMAL HIGH (ref 65–99)

## 2016-05-12 SURGERY — RELEASE, A1 PULLEY, FOR TRIGGER FINGER
Anesthesia: Monitor Anesthesia Care | Site: Hand | Laterality: Left

## 2016-05-12 MED ORDER — MEPERIDINE HCL 25 MG/ML IJ SOLN: 6.2500 mg | INTRAMUSCULAR | Status: DC | PRN

## 2016-05-12 MED ORDER — BUPIVACAINE HCL (PF) 0.25 % IJ SOLN
INTRAMUSCULAR | Status: DC | PRN
  Administered 2016-05-12: 3 mL

## 2016-05-12 MED ORDER — BETAMETHASONE SOD PHOS & ACET 6 (3-3) MG/ML IJ SUSP
INTRAMUSCULAR | Status: AC
  Filled 2016-05-12: qty 1

## 2016-05-12 MED ORDER — FENTANYL CITRATE (PF) 100 MCG/2ML IJ SOLN
INTRAMUSCULAR | Status: AC
  Filled 2016-05-12: qty 2

## 2016-05-12 MED ORDER — FENTANYL CITRATE (PF) 100 MCG/2ML IJ SOLN
50.0000 ug | INTRAMUSCULAR | Status: DC | PRN
  Administered 2016-05-12: 50 ug via INTRAVENOUS

## 2016-05-12 MED ORDER — GLYCOPYRROLATE 0.2 MG/ML IJ SOLN: 0.2000 mg | Freq: Once | INTRAMUSCULAR | Status: DC | PRN

## 2016-05-12 MED ORDER — LIDOCAINE 2% (20 MG/ML) 5 ML SYRINGE
INTRAMUSCULAR | Status: AC
  Filled 2016-05-12: qty 5

## 2016-05-12 MED ORDER — BUPIVACAINE HCL 0.25 % IJ SOLN
INTRAMUSCULAR | Status: DC | PRN
  Administered 2016-05-12: .5 mL

## 2016-05-12 MED ORDER — LACTATED RINGERS IV SOLN
INTRAVENOUS | Status: DC
  Administered 2016-05-12: 08:00:00 via INTRAVENOUS

## 2016-05-12 MED ORDER — SCOPOLAMINE 1 MG/3DAYS TD PT72: 1.0000 | MEDICATED_PATCH | Freq: Once | TRANSDERMAL | Status: DC | PRN

## 2016-05-12 MED ORDER — PROPOFOL 10 MG/ML IV BOLUS
INTRAVENOUS | Status: DC | PRN
  Administered 2016-05-12: 40 mg via INTRAVENOUS
  Administered 2016-05-12: 20 mg via INTRAVENOUS

## 2016-05-12 MED ORDER — CEFAZOLIN SODIUM-DEXTROSE 2-4 GM/100ML-% IV SOLN
INTRAVENOUS | Status: AC
  Filled 2016-05-12: qty 100

## 2016-05-12 MED ORDER — BUPIVACAINE HCL (PF) 0.25 % IJ SOLN
INTRAMUSCULAR | Status: AC
  Filled 2016-05-12: qty 30

## 2016-05-12 MED ORDER — FENTANYL CITRATE (PF) 100 MCG/2ML IJ SOLN: 25.0000 ug | INTRAMUSCULAR | Status: DC | PRN

## 2016-05-12 MED ORDER — CEFAZOLIN SODIUM-DEXTROSE 2-4 GM/100ML-% IV SOLN
2.0000 g | INTRAVENOUS | Status: AC
  Administered 2016-05-12: 2 g via INTRAVENOUS

## 2016-05-12 MED ORDER — CHLORHEXIDINE GLUCONATE 4 % EX LIQD: 60.0000 mL | Freq: Once | CUTANEOUS | Status: DC

## 2016-05-12 MED ORDER — PROPOFOL 500 MG/50ML IV EMUL
INTRAVENOUS | Status: AC
  Filled 2016-05-12: qty 50

## 2016-05-12 MED ORDER — HYDROCODONE-ACETAMINOPHEN 5-325 MG PO TABS: ORAL_TABLET | ORAL | Status: DC

## 2016-05-12 MED ORDER — LIDOCAINE HCL (CARDIAC) 20 MG/ML IV SOLN
INTRAVENOUS | Status: DC | PRN
  Administered 2016-05-12: 5 mg via INTRAVENOUS

## 2016-05-12 MED ORDER — LIDOCAINE HCL (PF) 1 % IJ SOLN
INTRAMUSCULAR | Status: AC
  Filled 2016-05-12: qty 30

## 2016-05-12 MED ORDER — MIDAZOLAM HCL 2 MG/2ML IJ SOLN: 1.0000 mg | INTRAMUSCULAR | Status: DC | PRN

## 2016-05-12 MED ORDER — BUPIVACAINE HCL (PF) 0.25 % IJ SOLN
INTRAMUSCULAR | Status: AC
  Filled 2016-05-12: qty 150

## 2016-05-12 MED ORDER — LIDOCAINE HCL 1 % IJ SOLN
INTRAMUSCULAR | Status: DC | PRN
  Administered 2016-05-12: 3 mL

## 2016-05-12 MED ORDER — ONDANSETRON HCL 4 MG/2ML IJ SOLN
INTRAMUSCULAR | Status: AC
  Filled 2016-05-12: qty 2

## 2016-05-12 MED ORDER — ATROPINE SULFATE 0.4 MG/ML IJ SOLN
INTRAMUSCULAR | Status: AC
  Filled 2016-05-12: qty 1

## 2016-05-12 SURGICAL SUPPLY — 34 items
BANDAGE COBAN STERILE 2 (GAUZE/BANDAGES/DRESSINGS) ×3 IMPLANT
BLADE MINI RND TIP GREEN BEAV (BLADE) IMPLANT
BLADE SURG 15 STRL LF DISP TIS (BLADE) ×2 IMPLANT
BLADE SURG 15 STRL SS (BLADE) ×6
BNDG CMPR 9X4 STRL LF SNTH (GAUZE/BANDAGES/DRESSINGS)
BNDG CONFORM 2 STRL LF (GAUZE/BANDAGES/DRESSINGS) ×3 IMPLANT
BNDG ESMARK 4X9 LF (GAUZE/BANDAGES/DRESSINGS) IMPLANT
CHLORAPREP W/TINT 26ML (MISCELLANEOUS) ×3 IMPLANT
CORDS BIPOLAR (ELECTRODE) ×3 IMPLANT
COVER BACK TABLE 60X90IN (DRAPES) ×3 IMPLANT
COVER MAYO STAND STRL (DRAPES) ×3 IMPLANT
CUFF TOURNIQUET SINGLE 18IN (TOURNIQUET CUFF) ×3 IMPLANT
DRAPE EXTREMITY T 121X128X90 (DRAPE) ×3 IMPLANT
DRAPE SURG 17X23 STRL (DRAPES) ×3 IMPLANT
GAUZE SPONGE 4X4 12PLY STRL (GAUZE/BANDAGES/DRESSINGS) ×3 IMPLANT
GAUZE XEROFORM 1X8 LF (GAUZE/BANDAGES/DRESSINGS) ×3 IMPLANT
GLOVE BIO SURGEON STRL SZ7.5 (GLOVE) ×3 IMPLANT
GLOVE BIOGEL PI IND STRL 8 (GLOVE) ×1 IMPLANT
GLOVE BIOGEL PI INDICATOR 8 (GLOVE) ×2
GOWN STRL REIN XL XLG (GOWN DISPOSABLE) ×3 IMPLANT
GOWN STRL REUS W/ TWL LRG LVL3 (GOWN DISPOSABLE) ×1 IMPLANT
GOWN STRL REUS W/TWL LRG LVL3 (GOWN DISPOSABLE) ×3
NDL HYPO 25X1 1.5 SAFETY (NEEDLE) IMPLANT
NEEDLE HYPO 25X1 1.5 SAFETY (NEEDLE) IMPLANT
NS IRRIG 1000ML POUR BTL (IV SOLUTION) ×3 IMPLANT
PACK BASIN DAY SURGERY FS (CUSTOM PROCEDURE TRAY) ×3 IMPLANT
PADDING CAST ABS 4INX4YD NS (CAST SUPPLIES) ×2
PADDING CAST ABS COTTON 4X4 ST (CAST SUPPLIES) ×1 IMPLANT
STOCKINETTE 4X48 STRL (DRAPES) ×3 IMPLANT
SUT ETHILON 4 0 PS 2 18 (SUTURE) ×3 IMPLANT
SYR BULB 3OZ (MISCELLANEOUS) ×3 IMPLANT
SYR CONTROL 10ML LL (SYRINGE) IMPLANT
TOWEL OR 17X24 6PK STRL BLUE (TOWEL DISPOSABLE) ×6 IMPLANT
UNDERPAD 30X30 (UNDERPADS AND DIAPERS) ×3 IMPLANT

## 2016-05-12 NOTE — H&P (Signed)
[name] is an 70 y.o. female.   Chief Complaint: left ring finger and right index finger trigger digits HPI: 70 yo female with triggering of left ring finger x 2-3 years.  This has been injected x 2 without relief.  She wishes to have a release of the left ring finger trigger digit.  She has also noted catching in the right index finger and would like this injected as well.  Allergies:  Allergies  Allergen Reactions  . Codeine Nausea And Vomiting  . Sulfa Antibiotics Other (See Comments)    Allergic per pt's mother     Past Medical History  Diagnosis Date  . Unspecified hearing loss   . Esophagitis, unspecified   . Diaphragmatic hernia without mention of obstruction or gangrene   . Incontinence of feces   . Unspecified essential hypertension   . Unspecified asthma(493.90)   . Sarcoidosis ([employer])   . Irritable bowel syndrome   . Esophageal reflux   . H/O adenomatous polyp of colon 2011  . Anxiety   . DM (diabetes mellitus) ([place])   . Depression   . Status post dilation of esophageal narrowing   . Kidney stones   . Sleep apnea     no cpap now  . Allergy   . Cataract     bilateral and removed  . Hyperlipidemia     Past Surgical History  Procedure Laterality Date  . Partial hysterectomy    . Tonsillectomy and adenoidectomy    . Nasal septum surgery    . Tubal ligation    . Hemorrhoid surgery    . Bladder surgery    . Cataract extraction Bilateral     Family History: Family History  Problem Relation Age of Onset  . Esophageal cancer Father   . Diabetes Mother   . Colon cancer Neg Hx   . Rectal cancer Neg Hx   . Stomach cancer Neg Hx   . Diabetes Maternal Grandmother     Social History:   reports that she has never smoked. She has never used smokeless tobacco. She reports that she does not drink alcohol or use illicit drugs.  Medications: Facility-administered medications prior to admission  Medication Dose Route Frequency Provider Last Rate Last Dose   . omalizumab Arvid Right) injection 150 mg  150 mg Subcutaneous Q28 days Jiles Prows, MD   150 mg at 05/02/16 1007   Medications Prior to Admission  Medication Sig Dispense Refill  . albuterol (PROAIR HFA) 108 (90 BASE) MCG/ACT inhaler Inhale 2 puffs into the lungs as needed for wheezing.    Marland Kitchen atorvastatin (LIPITOR) 20 MG tablet Take 20 mg by mouth daily.    . beclomethasone (QVAR) 80 MCG/ACT inhaler Inhale 2 puffs into the lungs daily.      . citalopram (CELEXA) 20 MG tablet Take 20 mg by mouth daily.      . fluticasone (FLONASE) 50 MCG/ACT nasal spray Use 1-2 sprays in each nostril once daily for stuffy nose or drainage 16 g 5  . hydrochlorothiazide (HYDRODIURIL) 12.5 MG tablet Take 12.5 mg by mouth daily.    Marland Kitchen HYDROcodone-acetaminophen (NORCO/VICODIN) 5-325 MG tablet Take 1 tablet by mouth as needed.    . Liraglutide (VICTOZA Wolfe) Inject into the skin. 0.6 every night    . losartan (COZAAR) 100 MG tablet Take 100 mg by mouth daily.    Marland Kitchen omeprazole (PRILOSEC) 40 MG capsule TAKE ONE CAPSULE BY MOUTH TWICE DAILY 60 capsule 5  . pioglitazone (ACTOS) 30  MG tablet Take 30 mg by mouth daily.    Marland Kitchen azelastine (ASTELIN) 0.1 % nasal spray     . Calcium Polycarbophil (EQUALACTIN PO) Take 1 capsule by mouth daily.    Marland Kitchen EPINEPHrine 0.3 mg/0.3 mL IJ SOAJ injection USE AS DIRECTED FOR LIFE THREATENING ALLERGIC REACTION 2 Device 3  . hydrocortisone (PROCTOCORT) 1 % CREA Apply to rectum three times daily as needed 30 g 1  . Ibuprofen 200 MG CAPS Take by mouth as needed.      Marland Kitchen ipratropium-albuterol (DUONEB) 0.5-2.5 (3) MG/3ML SOLN Take 3 mLs by nebulization every 6 (six) hours as needed.    . mometasone-formoterol (DULERA) 200-5 MCG/ACT AERO Inhale 2 puffs into the lungs 2 (two) times daily.    . Probiotic Product (PROBIOTIC DAILY PO) Take 1 capsule by mouth daily.      Results for orders placed or performed during the hospital encounter of 05/12/16 (from the past 48 hour(s))  Glucose, capillary      Status: Abnormal   Collection Time: 05/12/16  7:12 AM  Result Value Ref Range   Glucose-Capillary 109 (H) 65 - 99 mg/dL    No results found.   A comprehensive review of systems was negative.  Blood pressure 157/48, pulse 54, temperature 97.7 F (36.5 C), temperature source Oral, resp. rate 16, height 5\' 1"  (1.549 m), weight 71.271 kg (157 lb 2 oz), SpO2 100 %.  General appearance: alert, cooperative and appears stated age Head: Normocephalic, without obvious abnormality, atraumatic Neck: supple, symmetrical, trachea midline Resp: clear to auscultation bilaterally Cardio: regular rate and rhythm GI: non-tender Extremities: Intact sensation and capillary refill all digits.  +epl/fpl/io.  No wounds.  Pulses: 2+ and symmetric Skin: Skin color, texture, turgor normal. No rashes or lesions Neurologic: Grossly normal Incision/Wound:none  Assessment/Plan Left ring finger and right index finger trigger digit.  Non operative and operative treatment options were discussed with the patient and patient wishes to proceed with operative treatment. Risks, benefits, and alternatives of surgery were discussed and the patient agrees with the plan of care.   Jermiya Reichl R 05/12/2016, 7:56 AM

## 2016-05-12 NOTE — Transfer of Care (Signed)
Immediate Anesthesia Transfer of Care Note  Patient: [name]  Procedure(s) Performed: Procedure(s): RELEASE TRIGGER FINGER/A-1 PULLEY INFECTION LEFT RING TENDON SHEATH INJECT RIGHT INDEX FINGER (Left)  Patient Location: PACU  Anesthesia Type:MAC  Level of Consciousness: awake, alert  and oriented  Airway & Oxygen Therapy: Patient Spontanous Breathing and Patient connected to face mask oxygen  Post-op Assessment: Report given to RN, Post -op Vital signs reviewed and stable and Patient moving all extremities  Post vital signs: Reviewed and stable  Last Vitals:  Filed Vitals:   05/12/16 0708 05/12/16 0935  BP: 157/48 162/79  Pulse: 54 70  Temp: 36.5 C 36.7 C  Resp: 16 18    Last Pain:  Filed Vitals:   05/12/16 0941  PainSc: 0-No pain         Complications: No apparent anesthesia complications

## 2016-05-12 NOTE — Discharge Instructions (Addendum)

## 2016-05-12 NOTE — Anesthesia Postprocedure Evaluation (Signed)
Anesthesia Post Note  Patient: [name]  Procedure(s) Performed: Procedure(s) (LRB): RELEASE TRIGGER FINGER/A-1 PULLEY INFECTION LEFT RING TENDON SHEATH INJECT RIGHT INDEX FINGER (Left)  Patient location during evaluation: PACU Anesthesia Type: General Level of consciousness: awake and alert Pain management: pain level controlled Vital Signs Assessment: post-procedure vital signs reviewed and stable Respiratory status: spontaneous breathing, nonlabored ventilation and respiratory function stable Cardiovascular status: blood pressure returned to baseline and stable Postop Assessment: no signs of nausea or vomiting Anesthetic complications: no    Last Vitals:  Filed Vitals:   05/12/16 0945 05/12/16 1007  BP: 165/59 156/67  Pulse: 64 70  Temp:  36.4 C  Resp: 17 20    Last Pain:  Filed Vitals:   05/12/16 1009  PainSc: 0-No pain                 Valeree Leidy A

## 2016-05-12 NOTE — Brief Op Note (Signed)
05/12/2016  9:29 AM  PATIENT:  [name]  70 y.o. female  PRE-OPERATIVE DIAGNOSIS:  STENOSING TENOSYNOVITIS LEFT RING FINGER RIGHT INDEX  POST-OPERATIVE DIAGNOSIS:  STENOSING TENOSYNOVITIS LEFT RING FINGER RIGHT INDEX  PROCEDURE:  Procedure(s): RELEASE TRIGGER FINGER/A-1 PULLEY INFECTION LEFT RING TENDON SHEATH INJECT RIGHT INDEX FINGER (Left)  SURGEON:  Surgeon(s) and Role:    * Leanora Cover, MD - Primary  PHYSICIAN ASSISTANT:   ASSISTANTS: none   ANESTHESIA:   local and MAC  EBL:     BLOOD ADMINISTERED:none  DRAINS: none   LOCAL MEDICATIONS USED:  MARCAINE     SPECIMEN:  No Specimen  DISPOSITION OF SPECIMEN:  N/A  COUNTS:  YES  TOURNIQUET:   Total Tourniquet Time Documented: Forearm (Left) - 11 minutes Total: Forearm (Left) - 11 minutes   DICTATION: .Other Dictation: Dictation Number no confirmation # given  PLAN OF CARE: Discharge to home after PACU  PATIENT DISPOSITION:  PACU - hemodynamically stable.

## 2016-05-12 NOTE — Anesthesia Preprocedure Evaluation (Addendum)
Anesthesia Evaluation  Patient identified by MRN, date of birth, ID band Patient awake    Reviewed: Allergy & Precautions, NPO status , Patient's Chart, lab work & pertinent test results  Airway Mallampati: II  TM Distance: >3 FB Neck ROM: Full    Dental  (+) Teeth Intact, Dental Advisory Given   Pulmonary asthma , sleep apnea , COPD,    breath sounds clear to auscultation       Cardiovascular hypertension, Pt. on medications  Rhythm:Regular Rate:Normal     Neuro/Psych    GI/Hepatic GERD  Medicated and Controlled,  Endo/Other  diabetes, Well Controlled, Type 2, Oral Hypoglycemic Agents  Renal/GU      Musculoskeletal   Abdominal   Peds  Hematology   Anesthesia Other Findings   Reproductive/Obstetrics                          Anesthesia Physical Anesthesia Plan  ASA: III  Anesthesia Plan: MAC   Post-op Pain Management:    Induction: Intravenous  Airway Management Planned: Simple Face Mask  Additional Equipment:   Intra-op Plan:   Post-operative Plan:   Informed Consent: I have reviewed the patients History and Physical, chart, labs and discussed the procedure including the risks, benefits and alternatives for the proposed anesthesia with the patient or authorized representative who has indicated his/her understanding and acceptance.   Dental advisory given  Plan Discussed with: CRNA, Anesthesiologist and Surgeon  Anesthesia Plan Comments:         Anesthesia Quick Evaluation

## 2016-05-12 NOTE — Anesthesia Procedure Notes (Signed)
Procedure Name: MAC Date/Time: 05/12/2016 8:59 AM Performed by: Baxter Flattery Pre-anesthesia Checklist: Patient identified, Emergency Drugs available, Suction available and Patient being monitored Patient Re-evaluated:Patient Re-evaluated prior to inductionOxygen Delivery Method: Simple face mask Preoxygenation: Pre-oxygenation with 100% oxygen Intubation Type: IV induction Ventilation: Mask ventilation without difficulty

## 2016-05-13 ENCOUNTER — Encounter (HOSPITAL_BASED_OUTPATIENT_CLINIC_OR_DEPARTMENT_OTHER): Payer: Self-pay | Admitting: Orthopedic Surgery

## 2016-05-13 NOTE — Op Note (Signed)
NAMETRISHIA, [name]              ACCOUNT NO.:  [id number]  MEDICAL RECORD NO.:  [id number]  LOCATION:                                 FACILITY:  PHYSICIAN:  Leanora Cover, MD             DATE OF BIRTH:  DATE OF PROCEDURE:  05/12/2016 DATE OF DISCHARGE:                              OPERATIVE REPORT   PREOPERATIVE DIAGNOSES:  Left ring finger trigger digit and right index finger trigger digit.  POSTOPERATIVE DIAGNOSES:  Left ring finger trigger digit and right index finger trigger digit.  PROCEDURE:   1. Left ring finger trigger digit release  2. Right index finger injection of flexor tendon sheath.  SURGEON:  Leanora Cover, M.D.  ASSISTANTS:  None.  ANESTHESIA:  MAC with local.  IV FLUIDS:  Per anesthesia flow sheet.  ESTIMATED BLOOD LOSS:  Minimal.  COMPLICATIONS:  None.  SPECIMENS:  None.  TOURNIQUET TIME:  11 minutes on the left.  DISPOSITION:  Stable to PACU.  INDICATIONS:  [name] is a 70 year old female, who has had triggering of the left ring and right index fingers.  Left has been injected twice without resolution.  The right has not been injected. This is bothersome to her.  She wished to have a trigger release on the left ring finger and injection of the right index finger.  Risks, benefits, and alternatives of surgery were discussed including risk of blood loss; infection; damage to nerves, vessels, tendons, ligaments, bone; failure of surgery; need for additional surgery; complications with wound healing; continued pain; and recurrence of triggering.  She voiced understanding of risks and elected to proceed.  OPERATIVE COURSE:  After being identified preoperatively by myself, the patient and I agreed upon procedure and site of procedure.  Surgical site was marked.  The risks, benefits, and alternatives of surgery were reviewed and she wished to proceed.  Surgical consent had been signed. She was transferred to the operating room and placed on the  operating room table in supine position with left upper extremity on arm board. MAC anesthesia was induced.  The surgical site of the left ring finger was injected with approximately 7 mL of half and half solution and 1% plain lidocaine and 0.25% plain Marcaine.  After a surgical pause had been performed by the surgeons, anesthesia, and operating staff, and all were in agreement as to the patient, procedure, and site of procedure. The right index finger flexor sheath was injected with 0.5 mL of 1% plain Xylocaine and 0.5 mL of Celestone.  This was well tolerated.  The left upper extremity was prepped and draped in normal sterile orthopedic fashion.  Surgical pause was performed again between surgeons, anesthesia, and operating staff and all were in agreement as to the patient, procedure, and site of procedure.  Tourniquet at the proximal aspect of the forearm was inflated to 250 mmHg after exsanguination of the limb with Esmarch bandage.  An incision was made over the ring finger MP joint volarly.  This was carried into subcutaneous tissues by spreading technique.  The A1 pulley was identified.  It was sharply incised.  Care was taken to ensure complete decompression.  The proximal 1-2 mm of the A2 sheath was incised as well to provide lateral tunnel for the flexor tendons.  The tendons were brought through the wound and separated.  The finger was placed through range of motion.  There was no recurrence of triggering or clicking.  The wound was copiously irrigated with sterile saline.  It was then closed with 4-0 nylon in a horizontal mattress fashion.  It was dressed with sterile Xeroform, 4x4s, and wrapped with a Coban dressing lightly.  Tourniquet was deflated at 11 minutes.  Fingertips were pink with brisk capillary refill after deflation of tourniquet.  Operative drapes were broken down.  The patient was awoken from anesthesia safely.  She was transferred back to stretcher and taken  to PACU in stable condition.  I will see her back in the office in 1 week for postoperative followup.  I will give her Norco 5/325, 1-2 p.o. q.6 hours p.r.n. pain, dispensed #20.     Leanora Cover, MD   ______________________________ Leanora Cover, MD    KK/MEDQ  D:  05/12/2016  T:  05/12/2016  Job:  [id number]

## 2016-05-20 DIAGNOSIS — M79642 Pain in left hand: Secondary | ICD-10-CM | POA: Diagnosis not present

## 2016-05-26 DIAGNOSIS — M79642 Pain in left hand: Secondary | ICD-10-CM | POA: Diagnosis not present

## 2016-05-27 DIAGNOSIS — M25531 Pain in right wrist: Secondary | ICD-10-CM | POA: Diagnosis not present

## 2016-05-27 DIAGNOSIS — S52552A Other extraarticular fracture of lower end of left radius, initial encounter for closed fracture: Secondary | ICD-10-CM | POA: Diagnosis not present

## 2016-05-27 DIAGNOSIS — S52551A Other extraarticular fracture of lower end of right radius, initial encounter for closed fracture: Secondary | ICD-10-CM | POA: Diagnosis not present

## 2016-05-27 DIAGNOSIS — M25532 Pain in left wrist: Secondary | ICD-10-CM | POA: Diagnosis not present

## 2016-05-30 DIAGNOSIS — X58XXXA Exposure to other specified factors, initial encounter: Secondary | ICD-10-CM | POA: Diagnosis not present

## 2016-05-30 DIAGNOSIS — S52572A Other intraarticular fracture of lower end of left radius, initial encounter for closed fracture: Secondary | ICD-10-CM | POA: Diagnosis not present

## 2016-05-30 DIAGNOSIS — G8918 Other acute postprocedural pain: Secondary | ICD-10-CM | POA: Diagnosis not present

## 2016-05-30 DIAGNOSIS — S52551A Other extraarticular fracture of lower end of right radius, initial encounter for closed fracture: Secondary | ICD-10-CM | POA: Diagnosis not present

## 2016-05-30 DIAGNOSIS — W19XXXA Unspecified fall, initial encounter: Secondary | ICD-10-CM | POA: Diagnosis not present

## 2016-05-30 DIAGNOSIS — G4730 Sleep apnea, unspecified: Secondary | ICD-10-CM | POA: Diagnosis not present

## 2016-06-01 ENCOUNTER — Ambulatory Visit (INDEPENDENT_AMBULATORY_CARE_PROVIDER_SITE_OTHER): Payer: Medicare Other

## 2016-06-01 DIAGNOSIS — J4540 Moderate persistent asthma, uncomplicated: Secondary | ICD-10-CM

## 2016-06-02 DIAGNOSIS — J4540 Moderate persistent asthma, uncomplicated: Secondary | ICD-10-CM | POA: Diagnosis not present

## 2016-06-05 DIAGNOSIS — S52509A Unspecified fracture of the lower end of unspecified radius, initial encounter for closed fracture: Secondary | ICD-10-CM | POA: Insufficient documentation

## 2016-06-05 HISTORY — DX: Unspecified fracture of the lower end of unspecified radius, initial encounter for closed fracture: S52.509A

## 2016-06-06 DIAGNOSIS — S52551D Other extraarticular fracture of lower end of right radius, subsequent encounter for closed fracture with routine healing: Secondary | ICD-10-CM | POA: Diagnosis not present

## 2016-06-06 DIAGNOSIS — M25632 Stiffness of left wrist, not elsewhere classified: Secondary | ICD-10-CM

## 2016-06-06 DIAGNOSIS — S52552D Other extraarticular fracture of lower end of left radius, subsequent encounter for closed fracture with routine healing: Secondary | ICD-10-CM | POA: Diagnosis not present

## 2016-06-06 DIAGNOSIS — M25532 Pain in left wrist: Secondary | ICD-10-CM

## 2016-06-06 DIAGNOSIS — M79642 Pain in left hand: Secondary | ICD-10-CM | POA: Diagnosis not present

## 2016-06-06 HISTORY — DX: Stiffness of left wrist, not elsewhere classified: M25.632

## 2016-06-06 HISTORY — DX: Pain in left wrist: M25.532

## 2016-06-29 DIAGNOSIS — M25562 Pain in left knee: Secondary | ICD-10-CM | POA: Diagnosis not present

## 2016-06-29 DIAGNOSIS — M2550 Pain in unspecified joint: Secondary | ICD-10-CM | POA: Diagnosis not present

## 2016-06-29 DIAGNOSIS — M79641 Pain in right hand: Secondary | ICD-10-CM | POA: Diagnosis not present

## 2016-06-29 DIAGNOSIS — M79642 Pain in left hand: Secondary | ICD-10-CM | POA: Diagnosis not present

## 2016-06-29 DIAGNOSIS — J4540 Moderate persistent asthma, uncomplicated: Secondary | ICD-10-CM | POA: Diagnosis not present

## 2016-06-29 DIAGNOSIS — Z79899 Other long term (current) drug therapy: Secondary | ICD-10-CM | POA: Diagnosis not present

## 2016-06-29 DIAGNOSIS — M810 Age-related osteoporosis without current pathological fracture: Secondary | ICD-10-CM | POA: Diagnosis not present

## 2016-06-29 DIAGNOSIS — E559 Vitamin D deficiency, unspecified: Secondary | ICD-10-CM | POA: Diagnosis not present

## 2016-06-29 DIAGNOSIS — R5381 Other malaise: Secondary | ICD-10-CM | POA: Diagnosis not present

## 2016-06-29 DIAGNOSIS — M25512 Pain in left shoulder: Secondary | ICD-10-CM | POA: Diagnosis not present

## 2016-06-29 DIAGNOSIS — M25561 Pain in right knee: Secondary | ICD-10-CM | POA: Diagnosis not present

## 2016-06-29 DIAGNOSIS — S52552D Other extraarticular fracture of lower end of left radius, subsequent encounter for closed fracture with routine healing: Secondary | ICD-10-CM | POA: Diagnosis not present

## 2016-06-30 ENCOUNTER — Ambulatory Visit (INDEPENDENT_AMBULATORY_CARE_PROVIDER_SITE_OTHER): Payer: Medicare Other | Admitting: *Deleted

## 2016-06-30 DIAGNOSIS — J4540 Moderate persistent asthma, uncomplicated: Secondary | ICD-10-CM | POA: Diagnosis not present

## 2016-07-06 DIAGNOSIS — J4551 Severe persistent asthma with (acute) exacerbation: Secondary | ICD-10-CM | POA: Diagnosis not present

## 2016-07-06 DIAGNOSIS — M810 Age-related osteoporosis without current pathological fracture: Secondary | ICD-10-CM | POA: Diagnosis not present

## 2016-07-06 DIAGNOSIS — G4733 Obstructive sleep apnea (adult) (pediatric): Secondary | ICD-10-CM | POA: Diagnosis not present

## 2016-07-15 DIAGNOSIS — E119 Type 2 diabetes mellitus without complications: Secondary | ICD-10-CM | POA: Diagnosis not present

## 2016-07-15 DIAGNOSIS — Z0100 Encounter for examination of eyes and vision without abnormal findings: Secondary | ICD-10-CM | POA: Diagnosis not present

## 2016-07-15 DIAGNOSIS — H353131 Nonexudative age-related macular degeneration, bilateral, early dry stage: Secondary | ICD-10-CM | POA: Diagnosis not present

## 2016-07-15 DIAGNOSIS — H43813 Vitreous degeneration, bilateral: Secondary | ICD-10-CM | POA: Diagnosis not present

## 2016-07-20 DIAGNOSIS — S52551D Other extraarticular fracture of lower end of right radius, subsequent encounter for closed fracture with routine healing: Secondary | ICD-10-CM | POA: Diagnosis not present

## 2016-07-20 DIAGNOSIS — S52552D Other extraarticular fracture of lower end of left radius, subsequent encounter for closed fracture with routine healing: Secondary | ICD-10-CM | POA: Diagnosis not present

## 2016-07-20 DIAGNOSIS — M25512 Pain in left shoulder: Secondary | ICD-10-CM | POA: Diagnosis not present

## 2016-07-25 DIAGNOSIS — M79641 Pain in right hand: Secondary | ICD-10-CM | POA: Diagnosis not present

## 2016-07-25 DIAGNOSIS — R768 Other specified abnormal immunological findings in serum: Secondary | ICD-10-CM | POA: Diagnosis not present

## 2016-07-25 DIAGNOSIS — M810 Age-related osteoporosis without current pathological fracture: Secondary | ICD-10-CM | POA: Diagnosis not present

## 2016-07-25 DIAGNOSIS — M25561 Pain in right knee: Secondary | ICD-10-CM | POA: Diagnosis not present

## 2016-07-26 ENCOUNTER — Other Ambulatory Visit: Payer: Self-pay

## 2016-07-27 DIAGNOSIS — J4540 Moderate persistent asthma, uncomplicated: Secondary | ICD-10-CM | POA: Diagnosis not present

## 2016-07-28 ENCOUNTER — Ambulatory Visit (INDEPENDENT_AMBULATORY_CARE_PROVIDER_SITE_OTHER): Payer: Medicare Other | Admitting: *Deleted

## 2016-07-28 DIAGNOSIS — J4540 Moderate persistent asthma, uncomplicated: Secondary | ICD-10-CM

## 2016-07-29 DIAGNOSIS — R29898 Other symptoms and signs involving the musculoskeletal system: Secondary | ICD-10-CM | POA: Diagnosis not present

## 2016-07-29 DIAGNOSIS — M25612 Stiffness of left shoulder, not elsewhere classified: Secondary | ICD-10-CM | POA: Diagnosis not present

## 2016-07-29 DIAGNOSIS — M12812 Other specific arthropathies, not elsewhere classified, left shoulder: Secondary | ICD-10-CM | POA: Diagnosis not present

## 2016-07-29 DIAGNOSIS — M25512 Pain in left shoulder: Secondary | ICD-10-CM | POA: Diagnosis not present

## 2016-08-03 DIAGNOSIS — M25632 Stiffness of left wrist, not elsewhere classified: Secondary | ICD-10-CM | POA: Diagnosis not present

## 2016-08-03 DIAGNOSIS — M79642 Pain in left hand: Secondary | ICD-10-CM | POA: Diagnosis not present

## 2016-08-03 DIAGNOSIS — S52552D Other extraarticular fracture of lower end of left radius, subsequent encounter for closed fracture with routine healing: Secondary | ICD-10-CM | POA: Diagnosis not present

## 2016-08-03 DIAGNOSIS — S52551D Other extraarticular fracture of lower end of right radius, subsequent encounter for closed fracture with routine healing: Secondary | ICD-10-CM | POA: Diagnosis not present

## 2016-08-03 DIAGNOSIS — M79641 Pain in right hand: Secondary | ICD-10-CM | POA: Diagnosis not present

## 2016-08-04 DIAGNOSIS — M25612 Stiffness of left shoulder, not elsewhere classified: Secondary | ICD-10-CM | POA: Diagnosis not present

## 2016-08-04 DIAGNOSIS — R29898 Other symptoms and signs involving the musculoskeletal system: Secondary | ICD-10-CM | POA: Diagnosis not present

## 2016-08-04 DIAGNOSIS — M25512 Pain in left shoulder: Secondary | ICD-10-CM | POA: Diagnosis not present

## 2016-08-04 DIAGNOSIS — M12812 Other specific arthropathies, not elsewhere classified, left shoulder: Secondary | ICD-10-CM | POA: Diagnosis not present

## 2016-08-09 DIAGNOSIS — M25639 Stiffness of unspecified wrist, not elsewhere classified: Secondary | ICD-10-CM | POA: Diagnosis not present

## 2016-08-09 DIAGNOSIS — R531 Weakness: Secondary | ICD-10-CM | POA: Diagnosis not present

## 2016-08-12 DIAGNOSIS — M12812 Other specific arthropathies, not elsewhere classified, left shoulder: Secondary | ICD-10-CM | POA: Diagnosis not present

## 2016-08-12 DIAGNOSIS — R29898 Other symptoms and signs involving the musculoskeletal system: Secondary | ICD-10-CM | POA: Diagnosis not present

## 2016-08-12 DIAGNOSIS — M25512 Pain in left shoulder: Secondary | ICD-10-CM | POA: Diagnosis not present

## 2016-08-12 DIAGNOSIS — M25612 Stiffness of left shoulder, not elsewhere classified: Secondary | ICD-10-CM | POA: Diagnosis not present

## 2016-08-15 DIAGNOSIS — M25532 Pain in left wrist: Secondary | ICD-10-CM | POA: Diagnosis not present

## 2016-08-15 DIAGNOSIS — M25632 Stiffness of left wrist, not elsewhere classified: Secondary | ICD-10-CM | POA: Diagnosis not present

## 2016-08-16 DIAGNOSIS — E119 Type 2 diabetes mellitus without complications: Secondary | ICD-10-CM | POA: Diagnosis not present

## 2016-08-17 DIAGNOSIS — S52552D Other extraarticular fracture of lower end of left radius, subsequent encounter for closed fracture with routine healing: Secondary | ICD-10-CM | POA: Diagnosis not present

## 2016-08-17 DIAGNOSIS — S52551D Other extraarticular fracture of lower end of right radius, subsequent encounter for closed fracture with routine healing: Secondary | ICD-10-CM | POA: Diagnosis not present

## 2016-08-24 DIAGNOSIS — Z23 Encounter for immunization: Secondary | ICD-10-CM | POA: Diagnosis not present

## 2016-08-24 DIAGNOSIS — M1812 Unilateral primary osteoarthritis of first carpometacarpal joint, left hand: Secondary | ICD-10-CM | POA: Diagnosis not present

## 2016-08-24 DIAGNOSIS — E119 Type 2 diabetes mellitus without complications: Secondary | ICD-10-CM | POA: Diagnosis not present

## 2016-08-24 DIAGNOSIS — Z0000 Encounter for general adult medical examination without abnormal findings: Secondary | ICD-10-CM | POA: Diagnosis not present

## 2016-08-24 DIAGNOSIS — S52502D Unspecified fracture of the lower end of left radius, subsequent encounter for closed fracture with routine healing: Secondary | ICD-10-CM | POA: Diagnosis not present

## 2016-08-25 DIAGNOSIS — M25512 Pain in left shoulder: Secondary | ICD-10-CM | POA: Diagnosis not present

## 2016-08-25 DIAGNOSIS — M7542 Impingement syndrome of left shoulder: Secondary | ICD-10-CM | POA: Diagnosis not present

## 2016-08-29 ENCOUNTER — Ambulatory Visit (INDEPENDENT_AMBULATORY_CARE_PROVIDER_SITE_OTHER): Payer: Medicare Other | Admitting: *Deleted

## 2016-08-29 DIAGNOSIS — J4540 Moderate persistent asthma, uncomplicated: Secondary | ICD-10-CM | POA: Diagnosis not present

## 2016-08-30 DIAGNOSIS — J4540 Moderate persistent asthma, uncomplicated: Secondary | ICD-10-CM

## 2016-08-31 DIAGNOSIS — S52552D Other extraarticular fracture of lower end of left radius, subsequent encounter for closed fracture with routine healing: Secondary | ICD-10-CM | POA: Diagnosis not present

## 2016-08-31 DIAGNOSIS — M25639 Stiffness of unspecified wrist, not elsewhere classified: Secondary | ICD-10-CM | POA: Diagnosis not present

## 2016-08-31 DIAGNOSIS — R531 Weakness: Secondary | ICD-10-CM | POA: Diagnosis not present

## 2016-09-07 DIAGNOSIS — H90A31 Mixed conductive and sensorineural hearing loss, unilateral, right ear with restricted hearing on the contralateral side: Secondary | ICD-10-CM | POA: Diagnosis not present

## 2016-09-07 DIAGNOSIS — H90A22 Sensorineural hearing loss, unilateral, left ear, with restricted hearing on the contralateral side: Secondary | ICD-10-CM | POA: Diagnosis not present

## 2016-09-07 DIAGNOSIS — H9313 Tinnitus, bilateral: Secondary | ICD-10-CM | POA: Diagnosis not present

## 2016-09-07 DIAGNOSIS — Z01118 Encounter for examination of ears and hearing with other abnormal findings: Secondary | ICD-10-CM | POA: Diagnosis not present

## 2016-09-09 DIAGNOSIS — R531 Weakness: Secondary | ICD-10-CM | POA: Diagnosis not present

## 2016-09-09 DIAGNOSIS — M25639 Stiffness of unspecified wrist, not elsewhere classified: Secondary | ICD-10-CM | POA: Diagnosis not present

## 2016-09-12 ENCOUNTER — Ambulatory Visit (INDEPENDENT_AMBULATORY_CARE_PROVIDER_SITE_OTHER): Payer: Medicare Other | Admitting: Orthopaedic Surgery

## 2016-09-15 DIAGNOSIS — M25639 Stiffness of unspecified wrist, not elsewhere classified: Secondary | ICD-10-CM | POA: Diagnosis not present

## 2016-09-15 DIAGNOSIS — R531 Weakness: Secondary | ICD-10-CM | POA: Diagnosis not present

## 2016-09-20 DIAGNOSIS — M25639 Stiffness of unspecified wrist, not elsewhere classified: Secondary | ICD-10-CM | POA: Diagnosis not present

## 2016-09-20 DIAGNOSIS — M25649 Stiffness of unspecified hand, not elsewhere classified: Secondary | ICD-10-CM | POA: Diagnosis not present

## 2016-09-20 DIAGNOSIS — R531 Weakness: Secondary | ICD-10-CM | POA: Diagnosis not present

## 2016-09-21 ENCOUNTER — Other Ambulatory Visit: Payer: Self-pay | Admitting: Allergy and Immunology

## 2016-09-21 ENCOUNTER — Other Ambulatory Visit: Payer: Self-pay | Admitting: *Deleted

## 2016-09-21 MED ORDER — OMEPRAZOLE 40 MG PO CPDR: 40.0000 mg | DELAYED_RELEASE_CAPSULE | Freq: Two times a day (BID) | ORAL | 2 refills | Status: DC

## 2016-09-26 ENCOUNTER — Ambulatory Visit (INDEPENDENT_AMBULATORY_CARE_PROVIDER_SITE_OTHER): Payer: Medicare Other | Admitting: *Deleted

## 2016-09-26 DIAGNOSIS — J4540 Moderate persistent asthma, uncomplicated: Secondary | ICD-10-CM

## 2016-09-26 DIAGNOSIS — S52552D Other extraarticular fracture of lower end of left radius, subsequent encounter for closed fracture with routine healing: Secondary | ICD-10-CM | POA: Diagnosis not present

## 2016-09-27 DIAGNOSIS — J4540 Moderate persistent asthma, uncomplicated: Secondary | ICD-10-CM | POA: Diagnosis not present

## 2016-10-12 ENCOUNTER — Other Ambulatory Visit: Payer: Self-pay | Admitting: Radiology

## 2016-10-12 DIAGNOSIS — E559 Vitamin D deficiency, unspecified: Secondary | ICD-10-CM | POA: Diagnosis not present

## 2016-10-13 ENCOUNTER — Telehealth: Payer: Self-pay | Admitting: Radiology

## 2016-10-13 DIAGNOSIS — J22 Unspecified acute lower respiratory infection: Secondary | ICD-10-CM | POA: Diagnosis not present

## 2016-10-13 LAB — VITAMIN D 25 HYDROXY (VIT D DEFICIENCY, FRACTURES): VIT D 25 HYDROXY: 33 ng/mL (ref 30–100)

## 2016-10-13 NOTE — Telephone Encounter (Signed)
I have called patient to advise labs are normal per Dr Estanislado Pandy use Vit D 2000 units per day OTC

## 2016-10-13 NOTE — Telephone Encounter (Signed)
I will call patient to advise labs are normal /Vit D 33

## 2016-10-24 ENCOUNTER — Ambulatory Visit (INDEPENDENT_AMBULATORY_CARE_PROVIDER_SITE_OTHER): Payer: Medicare Other | Admitting: *Deleted

## 2016-10-24 DIAGNOSIS — J4540 Moderate persistent asthma, uncomplicated: Secondary | ICD-10-CM

## 2016-10-25 DIAGNOSIS — J4540 Moderate persistent asthma, uncomplicated: Secondary | ICD-10-CM

## 2016-10-28 ENCOUNTER — Ambulatory Visit (INDEPENDENT_AMBULATORY_CARE_PROVIDER_SITE_OTHER): Payer: Medicare Other | Admitting: Allergy and Immunology

## 2016-10-28 ENCOUNTER — Encounter: Payer: Self-pay | Admitting: Allergy and Immunology

## 2016-10-28 VITALS — BP 124/72 | HR 72 | Resp 20

## 2016-10-28 DIAGNOSIS — K219 Gastro-esophageal reflux disease without esophagitis: Secondary | ICD-10-CM | POA: Diagnosis not present

## 2016-10-28 DIAGNOSIS — J3089 Other allergic rhinitis: Secondary | ICD-10-CM | POA: Diagnosis not present

## 2016-10-28 DIAGNOSIS — J4550 Severe persistent asthma, uncomplicated: Secondary | ICD-10-CM | POA: Diagnosis not present

## 2016-10-28 MED ORDER — ALBUTEROL SULFATE HFA 108 (90 BASE) MCG/ACT IN AERS: INHALATION_SPRAY | RESPIRATORY_TRACT | 1 refills | Status: DC

## 2016-10-28 NOTE — Progress Notes (Signed)
Follow-up Note  Referring Provider: Myer Peer, MD Primary Provider: Ann Held, MD Date of Office Visit: 10/28/2016  Subjective:   [name] (DOB: 05/07/1946) is a 70 y.o. female who returns to the [facility] on 10/28/2016 in re-evaluation of the following:  HPI: [name] returns to this clinic in reevaluation of her severe asthma treated with omalizumab, allergic rhinitis, and LPR. I last saw her in his clinic in June 2017.  Apparently her lower respiratory tract disease was doing quite well until about 2 weeks ago. At that point in time she developed an episode of wheezing and coughing and she visited with Dr. Nicki Reaper who treated her with azithromycin and a steroid shot and prednisone. Apparently this was a very slow taper of prednisone and she is just finishing this up tomorrow. She is much better at this point in time. She still has a little bit of tightness and wheezing and is not 100% but has shown improvement with this therapy. It should be noted that she did not add in her inhaled steroid to her combination inhaler when she became ill as she no longer has any inhaled steroid. Apparently her insurance would not pay for her Qvar and she could not afford a $200 copayment.  Her nose for the most part has been doing relatively well and it does not sound as though she has required an antibiotic for an episode of sinusitis. Her reflux is under good control as well.  [name] did receive the flu vaccine for this year.    Medication List      albuterol 108 (90 Base) MCG/ACT inhaler Commonly known as:  PROAIR HFA Inhale two puffs every 4-6 hours if needed for cough or wheeze   atorvastatin 20 MG tablet Commonly known as:  LIPITOR Take 20 mg by mouth daily.   azelastine 0.1 % nasal spray Commonly known as:  ASTELIN   citalopram 20 MG tablet Commonly known as:  CELEXA Take 20 mg by mouth daily.   DULERA 200-5 MCG/ACT Aero Generic drug:   mometasone-formoterol Inhale 2 puffs into the lungs 2 (two) times daily.   EQUALACTIN PO Take 1 capsule by mouth daily.   fluticasone 50 MCG/ACT nasal spray Commonly known as:  FLONASE Use 1-2 sprays in each nostril once daily for stuffy nose or drainage   hydrochlorothiazide 12.5 MG tablet Commonly known as:  HYDRODIURIL Take 12.5 mg by mouth daily.   hydrocortisone 1 % Crea Commonly known as:  PROCTOCORT Apply to rectum three times daily as needed   Ibuprofen 200 MG Caps Take by mouth as needed.   ipratropium-albuterol 0.5-2.5 (3) MG/3ML Soln Commonly known as:  DUONEB Take 3 mLs by nebulization every 6 (six) hours as needed.   losartan 100 MG tablet Commonly known as:  COZAAR Take 100 mg by mouth daily.   omeprazole 40 MG capsule Commonly known as:  PRILOSEC Take 1 capsule (40 mg total) by mouth 2 (two) times daily.   pioglitazone 30 MG tablet Commonly known as:  ACTOS Take 30 mg by mouth daily.   PROBIOTIC DAILY PO Take 1 capsule by mouth daily.   VICTOZA Dutton Inject into the skin. 0.6 every night       Past Medical History:  Diagnosis Date  . Allergy   . Anxiety   . Cataract    bilateral and removed  . Depression   . Diaphragmatic hernia without mention of obstruction or gangrene   . DM (diabetes  mellitus) ([place])   . Esophageal reflux   . Esophagitis, unspecified   . H/O adenomatous polyp of colon 2011  . Hyperlipidemia   . Incontinence of feces   . Irritable bowel syndrome   . Kidney stones   . Sarcoidosis ([place])   . Sleep apnea    no cpap now  . Status post dilation of esophageal narrowing   . Unspecified asthma(493.90)   . Unspecified essential hypertension   . Unspecified hearing loss     Past Surgical History:  Procedure Laterality Date  . BLADDER SURGERY    . CATARACT EXTRACTION Bilateral   . HEMORRHOID SURGERY    . NASAL SEPTUM SURGERY    . PARTIAL HYSTERECTOMY    . TONSILLECTOMY AND ADENOIDECTOMY    . TRIGGER FINGER RELEASE Left  05/12/2016   Procedure: RELEASE TRIGGER FINGER/A-1 PULLEY INFECTION LEFT RING TENDON SHEATH INJECT RIGHT INDEX FINGER;  Surgeon: Leanora Cover, MD;  Location: [facility];  Service: Orthopedics;  Laterality: Left;  . TUBAL LIGATION      Allergies  Allergen Reactions  . Codeine Nausea And Vomiting  . Sulfa Antibiotics Other (See Comments)    Allergic per pt's mother     Review of systems negative except as noted in HPI / PMHx or noted below:  Review of Systems  Constitutional: Negative.   HENT: Negative.   Eyes: Negative.   Respiratory: Negative.   Cardiovascular: Negative.   Gastrointestinal: Negative.   Genitourinary: Negative.   Musculoskeletal: Negative.   Skin: Negative.   Neurological: Negative.   Endo/Heme/Allergies: Negative.   Psychiatric/Behavioral: Negative.      Objective:   Vitals:   10/28/16 1046  BP: 124/72  Pulse: 72  Resp: 20          Physical Exam  Constitutional: She is well-developed, well-nourished, and in no distress.  HENT:  Head: Normocephalic.  Right Ear: Tympanic membrane, external ear and ear canal normal.  Left Ear: Tympanic membrane, external ear and ear canal normal.  Nose: Nose normal. No mucosal edema or rhinorrhea.  Mouth/Throat: Uvula is midline, oropharynx is clear and moist and mucous membranes are normal. No oropharyngeal exudate.  Eyes: Conjunctivae are normal.  Neck: Trachea normal. No tracheal tenderness present. No tracheal deviation present. No thyromegaly present.  Cardiovascular: Normal rate, regular rhythm, S1 normal, S2 normal and normal heart sounds.   No murmur heard. Pulmonary/Chest: Breath sounds normal. No stridor. No respiratory distress. She has no wheezes. She has no rales.  Musculoskeletal: She exhibits no edema.  Lymphadenopathy:       Head (right side): No tonsillar adenopathy present.       Head (left side): No tonsillar adenopathy present.    She has no cervical adenopathy.  Neurological:  She is alert. Gait normal.  Skin: No rash noted. She is not diaphoretic. No erythema. Nails show no clubbing.  Psychiatric: Mood and affect normal.    Diagnostics:    Spirometry was performed and demonstrated an FEV1 of 1.06 at 52 % of predicted.  The patient had an Asthma Control Test with the following results: ACT Total Score: 13.    Assessment and Plan:   1. Severe persistent asthma without complication   2. Other allergic rhinitis   3. LPRD (laryngopharyngeal reflux disease)     1. Continue Xolair and Epi-Pen  2. Continue Dulera 200 two inhalations two times per day with spacer  3. Add SAMPLES of Qvar 40 two inhalations two times per day to [facility] during "  flare up".  4. Continue omeprazole 40 twice a day + ranitidine 300 in evening  5. Continue nasal saline, ProAir HFA, Duoneb if needed.  6. Finish prednisone taper  7. Return in 6 months or earlier if problem.  Overall M[place]sha has done relatively well on her current plan. I did give her samples of Qvar to add into her [facility] whenever she develops increased asthma activity. She'll continue on omalizumab at this point in time and continue to aggressively treat her reflux disease. If she continues to do okay I will see her back in this clinic in 6 months or earlier if there is a problem.  Allena Katz, MD [place]

## 2016-10-28 NOTE — Patient Instructions (Addendum)
  1. Continue Xolair and Epi-Pen  2. Continue Dulera 200 two inhalations two times per day with spacer  3. Add SAMPLES of Qvar 40 two inhalations two times per day to [facility] during "flare up".  4. Continue omeprazole 40 twice a day + ranitidine 300 in evening  5. Continue nasal saline, ProAir HFA, Duoneb if needed.  6. Finish prednisone taper  7. Return in 6 months or earlier if problem.

## 2016-11-07 DIAGNOSIS — J4551 Severe persistent asthma with (acute) exacerbation: Secondary | ICD-10-CM | POA: Diagnosis not present

## 2016-11-14 DIAGNOSIS — Z969 Presence of functional implant, unspecified: Secondary | ICD-10-CM | POA: Insufficient documentation

## 2016-11-14 HISTORY — DX: Presence of functional implant, unspecified: Z96.9

## 2016-11-23 ENCOUNTER — Ambulatory Visit (INDEPENDENT_AMBULATORY_CARE_PROVIDER_SITE_OTHER): Payer: Medicare Other | Admitting: *Deleted

## 2016-11-23 DIAGNOSIS — J4550 Severe persistent asthma, uncomplicated: Secondary | ICD-10-CM | POA: Diagnosis not present

## 2016-11-24 DIAGNOSIS — J4550 Severe persistent asthma, uncomplicated: Secondary | ICD-10-CM | POA: Diagnosis not present

## 2016-12-13 DIAGNOSIS — R768 Other specified abnormal immunological findings in serum: Secondary | ICD-10-CM | POA: Insufficient documentation

## 2016-12-13 DIAGNOSIS — M19042 Primary osteoarthritis, left hand: Secondary | ICD-10-CM

## 2016-12-13 DIAGNOSIS — M19041 Primary osteoarthritis, right hand: Secondary | ICD-10-CM

## 2016-12-13 HISTORY — DX: Primary osteoarthritis, left hand: M19.041

## 2016-12-13 HISTORY — DX: Other specified abnormal immunological findings in serum: R76.8

## 2016-12-13 HISTORY — DX: Primary osteoarthritis, left hand: M19.042

## 2016-12-13 NOTE — Progress Notes (Deleted)
Office Visit Note  Patient: [name]             Date of Birth: 1946-05-17           MRN: [id number]             PCP: Ann Held, MD Referring: Myer Peer, MD Visit Date: 12/15/2016 Occupation: @GUAROCC @    Subjective:  No chief complaint on file.   History of Present Illness: [name] is a 71 y.o. female ***   Activities of Daily Living:  Patient reports morning stiffness for *** {minute/hour:19697}.   Patient {ACTIONS;DENIES/REPORTS:21021675::"Denies"} nocturnal pain.  Difficulty dressing/grooming: {ACTIONS;DENIES/REPORTS:21021675::"Denies"} Difficulty climbing stairs: {ACTIONS;DENIES/REPORTS:21021675::"Denies"} Difficulty getting out of chair: {ACTIONS;DENIES/REPORTS:21021675::"Denies"} Difficulty using hands for taps, buttons, cutlery, and/or writing: {ACTIONS;DENIES/REPORTS:21021675::"Denies"}   No Rheumatology ROS completed.   PMFS History:  Patient Active Problem List   Diagnosis Date Noted  . Rheumatoid factor positive 12/13/2016  . Primary osteoarthritis of both hands 12/13/2016  . Obstructive lung disease ([place]) 07/29/2015  . H/O allergic rhinitis 07/29/2015  . LPRD (laryngopharyngeal reflux disease) 07/29/2015  . Chest pain 11/01/2013  . Murmur 11/01/2013  . DM 03/03/2010  . ABDOMINAL PAIN RIGHT LOWER QUADRANT 03/03/2010  . FULL INCONTINENCE OF FECES 03/03/2010  . DECREASED HEARING 05/02/2008  . HYPERTENSION 05/02/2008  . ESOPHAGITIS 05/02/2008  . HIATAL HERNIA 05/02/2008  . RECTAL INCONTINENCE 05/02/2008  . SARCOIDOSIS 11/16/2007  . Severe persistent asthma 11/16/2007  . GERD 11/16/2007  . IRRITABLE BOWEL SYNDROME 11/16/2007    Past Medical History:  Diagnosis Date  . Allergy   . Anxiety   . Cataract    bilateral and removed  . Depression   . Diaphragmatic hernia without mention of obstruction or gangrene   . DM (diabetes mellitus) ([place])   . Esophageal reflux   . Esophagitis, unspecified   . H/O adenomatous polyp of  colon 2011  . Hyperlipidemia   . Incontinence of feces   . Irritable bowel syndrome   . Kidney stones   . Sarcoidosis ([place])   . Sleep apnea    no cpap now  . Status post dilation of esophageal narrowing   . Unspecified asthma(493.90)   . Unspecified essential hypertension   . Unspecified hearing loss     Family History  Problem Relation Age of Onset  . Esophageal cancer Father   . Diabetes Mother   . Colon cancer Neg Hx   . Rectal cancer Neg Hx   . Stomach cancer Neg Hx   . Diabetes Maternal Grandmother    Past Surgical History:  Procedure Laterality Date  . BLADDER SURGERY    . CATARACT EXTRACTION Bilateral   . HEMORRHOID SURGERY    . NASAL SEPTUM SURGERY    . PARTIAL HYSTERECTOMY    . TONSILLECTOMY AND ADENOIDECTOMY    . TRIGGER FINGER RELEASE Left 05/12/2016   Procedure: RELEASE TRIGGER FINGER/A-1 PULLEY INFECTION LEFT RING TENDON SHEATH INJECT RIGHT INDEX FINGER;  Surgeon: Leanora Cover, MD;  Location: [facility];  Service: Orthopedics;  Laterality: Left;  . TUBAL LIGATION     Social History   Social History Narrative   Daily caffeine use.      Objective: Vital Signs: There were no vitals taken for this visit.   Physical Exam   Musculoskeletal Exam: ***  CDAI Exam: No CDAI exam completed.    Investigation: Findings:   Labs from 06/29/2016 show TIBC is normal.  CMP with GFR is normal except for slight elevation of creatinine  at 1.07, slight decrease in GFR at 53.  We will monitor.  CBC with diff shows mild anemia.  Otherwise normal with sed rate normal at 22.  Uric acid is normal at 4.2.  Magnesium is normal 1.9.  Ferritin is normal.    Rheumatoid factor is positive, but CCP is negative and 14-3-3 ETA is negative.    Vitamin D is low at 25   08/03/2016 CHIEF COMPLAINT:  Pain in bilateral hands.   HPI:  [name] was here to get ultrasound examination of bilateral hands.  After informed consent was obtained, per EULAR recommendations,  ultrasound examination of bilateral hand was performed using 12 MHz transducer, grey scale and power Doppler.  Bilateral 2nd, 3rd and 5th MCP joints and bilateral wrist joints, both dorsal and volar aspects were evaluated to look for synovitis and tenosynovitis.  The findings were she had no synovial thickening, no synovitis or tenosynovitis on examination.  Right median nerve was 0.7 cm 2 and left was 0.1 cm 2, which were within normal limits.   IMPRESSION AND PLAN:  She had no evidence of inflammatory arthritis.  She has had a history of positive rheumatoid factor in the past and her clinical examination is based on osteoarthritis.  She will be returning for followup as scheduled.  01-2011, hepatitis panel, ACE level, CCP, HLA-B27 were negative.    Positive RF. She has history of arthralgias, but not synovitis.       Imaging: No results found.  Speciality Comments: No specialty comments available.    Procedures:  No procedures performed Allergies: Codeine and Sulfa antibiotics   Assessment / Plan:     Visit Diagnoses: Rheumatoid factor positive  Primary osteoarthritis of both hands    Orders: No orders of the defined types were placed in this encounter.  No orders of the defined types were placed in this encounter.   Face-to-face time spent with patient was *** minutes. 50% of time was spent in counseling and coordination of care.  Follow-Up Instructions: No Follow-up on file.   Amy Littrell, RT  Note - This record has been created using [employer].  Chart creation errors have been sought, but may not always  have been located. Such creation errors do not reflect on  the standard of medical care.

## 2016-12-15 ENCOUNTER — Ambulatory Visit: Payer: Medicare Other | Admitting: Rheumatology

## 2016-12-20 DIAGNOSIS — J4540 Moderate persistent asthma, uncomplicated: Secondary | ICD-10-CM

## 2016-12-21 ENCOUNTER — Ambulatory Visit (INDEPENDENT_AMBULATORY_CARE_PROVIDER_SITE_OTHER): Payer: Medicare Other | Admitting: *Deleted

## 2016-12-21 DIAGNOSIS — J4540 Moderate persistent asthma, uncomplicated: Secondary | ICD-10-CM | POA: Diagnosis not present

## 2016-12-21 DIAGNOSIS — J4550 Severe persistent asthma, uncomplicated: Secondary | ICD-10-CM

## 2016-12-26 ENCOUNTER — Telehealth: Payer: Self-pay

## 2016-12-26 ENCOUNTER — Encounter: Payer: Self-pay | Admitting: Allergy and Immunology

## 2016-12-26 ENCOUNTER — Ambulatory Visit (INDEPENDENT_AMBULATORY_CARE_PROVIDER_SITE_OTHER): Payer: Medicare Other | Admitting: Allergy and Immunology

## 2016-12-26 VITALS — BP 134/70 | HR 64 | Resp 20

## 2016-12-26 DIAGNOSIS — J3089 Other allergic rhinitis: Secondary | ICD-10-CM | POA: Diagnosis not present

## 2016-12-26 DIAGNOSIS — R079 Chest pain, unspecified: Secondary | ICD-10-CM | POA: Diagnosis not present

## 2016-12-26 DIAGNOSIS — J4550 Severe persistent asthma, uncomplicated: Secondary | ICD-10-CM | POA: Diagnosis not present

## 2016-12-26 DIAGNOSIS — I209 Angina pectoris, unspecified: Secondary | ICD-10-CM | POA: Diagnosis not present

## 2016-12-26 DIAGNOSIS — R0789 Other chest pain: Secondary | ICD-10-CM

## 2016-12-26 DIAGNOSIS — K219 Gastro-esophageal reflux disease without esophagitis: Secondary | ICD-10-CM

## 2016-12-26 DIAGNOSIS — I259 Chronic ischemic heart disease, unspecified: Secondary | ICD-10-CM

## 2016-12-26 DIAGNOSIS — R05 Cough: Secondary | ICD-10-CM | POA: Diagnosis not present

## 2016-12-26 MED ORDER — EPINEPHRINE 0.3 MG/0.3ML IJ SOAJ: 0.3000 mg | Freq: Once | INTRAMUSCULAR | 2 refills | Status: AC

## 2016-12-26 NOTE — Patient Instructions (Signed)
  1. Continue Xolair and Epi-Pen  2. Continue Dulera 200 two inhalations two times per day with spacer  3. Add SAMPLES of Qvar 40 two inhalations two times per day to [facility] during "flare up".  4. Continue omeprazole 40 twice a day + ranitidine 300 in evening  5. Continue nasal saline, ProAir HFA, Duoneb if needed.  6. Get Chest x-ray for left sided chest pain  7. Take ibuprofen if needed.  8. Further evaluation?  9. Return in 6 months or earlier if problem.

## 2016-12-26 NOTE — Progress Notes (Signed)
Follow-up Note  Referring Provider: Myer Peer, MD Primary Provider: Ann Held, MD Date of Office Visit: 12/26/2016  Subjective:   [name] (DOB: 1946-07-05) is a 71 y.o. female who returns to the [facility] [place] on 12/26/2016 in re-evaluation of the following:  HPI: Loreatha presents to this clinic in evaluation of her severe asthma treated with omalizumab, allergic rhinitis, LPR, and new onset neck and chest pain. I last saw her in his clinic in December 2017.  Over the course of the past 2 months she has done wonderful with her respiratory tract. She has not required a systemic steroid or an antibiotic to treat any issue with her upper or lower airway. She does not use a bronchodilator greater than twice a week. Her reflux been under excellent control on her current therapy.  Last week she developed left-sided anterior chest pain that appear to be precipitated by working around the house picking up items. She's not really sure how long that lasted but it did resolve only to redevelop this morning. Now she has left-sided chest pain that migrates up into her left shoulder and up into her left lateral neck and over into her scapula and down her left arm. She's had some problems with her left arm and shoulder pain on and off for the past 6 months or so. She did have a fall in May requiring left wrist surgery and she has received a systemic steroid in her left shoulder sometime in the summer as well. She has no shortness of breath or GI issues or diaphoresis or feeling bad in general other than having pain. There does not appear to be a pleuritic quality. She did take a few ibuprofen and this appeared to help her pain somewhat. 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  Allergies as of 12/26/2016      Reactions   Codeine Nausea And Vomiting   Sulfa Antibiotics Other (See Comments)   Allergic per pt's mother       Medication List      albuterol 108 (90 Base) MCG/ACT inhaler Commonly known as:  PROAIR HFA Inhale two puffs every 4-6 hours if needed for cough or wheeze   atorvastatin 20 MG tablet Commonly known as:  LIPITOR Take 20 mg by mouth daily.   azelastine 0.1 % nasal spray Commonly known as:  ASTELIN   citalopram 20 MG tablet Commonly known as:  CELEXA Take 20 mg by mouth daily.   DULERA 200-5 MCG/ACT Aero Generic drug:  mometasone-formoterol Inhale 2 puffs into the lungs 2 (two) times daily.   EQUALACTIN PO Take 1 capsule by mouth daily.   fluticasone 50 MCG/ACT nasal spray Commonly known as:  FLONASE Use 1-2 sprays in each nostril once daily for stuffy nose or drainage   hydrochlorothiazide 12.5 MG tablet Commonly known as:  HYDRODIURIL Take 12.5 mg by mouth daily.   hydrocortisone 1 % Crea Commonly known as:  PROCTOCORT Apply to rectum three times daily as needed   Ibuprofen 200 MG Caps Take by mouth as needed.   ipratropium-albuterol 0.5-2.5 (3) MG/3ML Soln Commonly known as:  DUONEB Take 3 mLs by nebulization every 6 (six) hours as needed.   losartan 100 MG tablet Commonly known as:  COZAAR Take 100 mg by mouth daily.   omeprazole 40 MG capsule Commonly known as:  PRILOSEC Take 1 capsule (40 mg  total) by mouth 2 (two) times daily.   pioglitazone 30 MG tablet Commonly known as:  ACTOS Take 30 mg by mouth daily.   PROBIOTIC DAILY PO Take 1 capsule by mouth daily.   VICTOZA [place] Inject into the skin. 0.6 every night       Past Medical History:  Diagnosis Date  . Allergy   . Anxiety   . Cataract    bilateral and removed  . Depression   . Diaphragmatic hernia without mention of obstruction or gangrene   . DM (diabetes mellitus) ([place])   . Esophageal reflux   . Esophagitis, unspecified   . H/O adenomatous polyp of colon 2011  . Hyperlipidemia   . Incontinence of feces   . Irritable bowel syndrome   . Kidney  stones   . Sarcoidosis ([place])   . Sleep apnea    no cpap now  . Status post dilation of esophageal narrowing   . Unspecified asthma(493.90)   . Unspecified essential hypertension   . Unspecified hearing loss     Past Surgical History:  Procedure Laterality Date  . BLADDER SURGERY    . CATARACT EXTRACTION Bilateral   . HEMORRHOID SURGERY    . NASAL SEPTUM SURGERY    . PARTIAL HYSTERECTOMY    . TONSILLECTOMY AND ADENOIDECTOMY    . TRIGGER FINGER RELEASE Left 05/12/2016   Procedure: RELEASE TRIGGER FINGER/A-1 PULLEY INFECTION LEFT RING TENDON SHEATH INJECT RIGHT INDEX FINGER;  Surgeon: Leanora Cover, MD;  Location: [place];  Service: Orthopedics;  Laterality: Left;  . TUBAL LIGATION      Review of systems negative except as noted in HPI / PMHx or noted below:  Review of Systems  Constitutional: Negative.   HENT: Negative.   Eyes: Negative.   Respiratory: Negative.   Cardiovascular: Negative.   Gastrointestinal: Negative.   Genitourinary: Negative.   Musculoskeletal: Negative.   Skin: Negative.   Neurological: Negative.   Endo/Heme/Allergies: Negative.   Psychiatric/Behavioral: Negative.      Objective:   Vitals:   12/26/16 1415  BP: 134/70  Pulse: 64  Resp: 20          Physical Exam  Constitutional: She is well-developed, well-nourished, and in no distress.  HENT:  Head: Normocephalic.  Right Ear: Tympanic membrane, external ear and ear canal normal.  Left Ear: Tympanic membrane, external ear and ear canal normal.  Nose: Nose normal. No mucosal edema or rhinorrhea.  Mouth/Throat: Uvula is midline, oropharynx is clear and moist and mucous membranes are normal. No oropharyngeal exudate.  Eyes: Conjunctivae are normal.  Neck: Trachea normal. No tracheal tenderness present. No tracheal deviation present. No thyromegaly present.  Cardiovascular: Normal rate, regular rhythm, S1 normal, S2 normal and normal heart sounds.   No murmur  heard. Pulmonary/Chest: Breath sounds normal. No stridor. No respiratory distress. She has no wheezes. She has no rales.  Musculoskeletal: She exhibits no edema.  Lymphadenopathy:       Head (right side): No tonsillar adenopathy present.       Head (left side): No tonsillar adenopathy present.    She has no cervical adenopathy.  Neurological: She is alert. Gait normal.  Skin: No rash noted. She is not diaphoretic. No erythema. Nails show no clubbing.  Psychiatric: Mood and affect normal.    Diagnostics:    Spirometry was performed and demonstrated an FEV1 of 0.95 at 46 % of predicted.  The patient had an Asthma Control Test with the following results: ACT Total Score:  15.    Assessment and Plan:   1. Severe persistent asthma without complication   2. Other allergic rhinitis   3. LPRD (laryngopharyngeal reflux disease)   4. Chest pain due to myocardial ischemia, unspecified ischemic chest pain type ([place])   5. Chest pain of uncertain etiology     1. Continue Xolair and Epi-Pen  2. Continue Dulera 200 two inhalations two times per day with spacer  3. Add SAMPLES of Qvar 40 two inhalations two times per day to [facility] during "flare up".  4. Continue omeprazole 40 twice a day + ranitidine 300 in evening  5. Continue nasal saline, ProAir HFA, Duoneb if needed.  6. Get Chest x-ray for left sided chest pain  7. Take ibuprofen if needed.  8. Further evaluation?  9. Return in 6 months or earlier if problem.  It appears as though  Mattisen's respiratory tract is doing quite well as is her reflux on her current plan and I'm not going to have her change his therapy at this point in time. However, she does have this left-sided chest pain that sounds more musculoskeletal than it does cardiac especially given the lack of associated symptoms suggesting cardiac disease and her prior history of having problems with her left shoulder. I would like to get a chest x-ray to make sure we were not  dealing with some lung or pleural issue and she can take ibuprofen and will make a decision about how to proceed pending her response to this approach and the results of her chest x-ray.  Allena Katz, MD [place]

## 2016-12-26 NOTE — Telephone Encounter (Signed)
I informed patient that her chest x-ray was ok, per Dr.Kozlow. We will scan in results to [facility].

## 2016-12-27 ENCOUNTER — Encounter: Payer: Self-pay | Admitting: Allergy and Immunology

## 2016-12-27 DIAGNOSIS — R030 Elevated blood-pressure reading, without diagnosis of hypertension: Secondary | ICD-10-CM | POA: Diagnosis not present

## 2016-12-27 DIAGNOSIS — M5489 Other dorsalgia: Secondary | ICD-10-CM | POA: Diagnosis not present

## 2016-12-27 DIAGNOSIS — R079 Chest pain, unspecified: Secondary | ICD-10-CM | POA: Diagnosis not present

## 2016-12-27 DIAGNOSIS — M25511 Pain in right shoulder: Secondary | ICD-10-CM | POA: Diagnosis not present

## 2016-12-28 ENCOUNTER — Ambulatory Visit: Payer: Medicare Other | Admitting: Rheumatology

## 2016-12-30 DIAGNOSIS — I10 Essential (primary) hypertension: Secondary | ICD-10-CM | POA: Diagnosis not present

## 2017-01-09 ENCOUNTER — Other Ambulatory Visit: Payer: Self-pay | Admitting: Orthopedic Surgery

## 2017-01-09 ENCOUNTER — Encounter (HOSPITAL_BASED_OUTPATIENT_CLINIC_OR_DEPARTMENT_OTHER): Payer: Self-pay | Admitting: *Deleted

## 2017-01-10 ENCOUNTER — Encounter (HOSPITAL_BASED_OUTPATIENT_CLINIC_OR_DEPARTMENT_OTHER)
Admission: RE | Admit: 2017-01-10 | Discharge: 2017-01-10 | Disposition: A | Discharge status: Home / Self Care | Payer: Medicare Other | Source: Ambulatory Visit | Attending: Orthopedic Surgery | Admitting: Orthopedic Surgery

## 2017-01-10 DIAGNOSIS — Z7984 Long term (current) use of oral hypoglycemic drugs: Secondary | ICD-10-CM | POA: Diagnosis not present

## 2017-01-10 DIAGNOSIS — G4730 Sleep apnea, unspecified: Secondary | ICD-10-CM | POA: Diagnosis not present

## 2017-01-10 DIAGNOSIS — Z6830 Body mass index (BMI) 30.0-30.9, adult: Secondary | ICD-10-CM | POA: Diagnosis not present

## 2017-01-10 DIAGNOSIS — Z472 Encounter for removal of internal fixation device: Secondary | ICD-10-CM | POA: Diagnosis not present

## 2017-01-10 DIAGNOSIS — Z79899 Other long term (current) drug therapy: Secondary | ICD-10-CM | POA: Diagnosis not present

## 2017-01-10 DIAGNOSIS — K219 Gastro-esophageal reflux disease without esophagitis: Secondary | ICD-10-CM | POA: Diagnosis not present

## 2017-01-10 DIAGNOSIS — E669 Obesity, unspecified: Secondary | ICD-10-CM | POA: Diagnosis not present

## 2017-01-10 DIAGNOSIS — F329 Major depressive disorder, single episode, unspecified: Secondary | ICD-10-CM | POA: Diagnosis not present

## 2017-01-10 DIAGNOSIS — E785 Hyperlipidemia, unspecified: Secondary | ICD-10-CM | POA: Diagnosis not present

## 2017-01-10 DIAGNOSIS — I10 Essential (primary) hypertension: Secondary | ICD-10-CM | POA: Diagnosis not present

## 2017-01-10 DIAGNOSIS — Z7951 Long term (current) use of inhaled steroids: Secondary | ICD-10-CM | POA: Diagnosis not present

## 2017-01-10 DIAGNOSIS — J449 Chronic obstructive pulmonary disease, unspecified: Secondary | ICD-10-CM | POA: Diagnosis not present

## 2017-01-10 DIAGNOSIS — E119 Type 2 diabetes mellitus without complications: Secondary | ICD-10-CM | POA: Diagnosis not present

## 2017-01-10 DIAGNOSIS — K589 Irritable bowel syndrome without diarrhea: Secondary | ICD-10-CM | POA: Diagnosis not present

## 2017-01-10 LAB — BASIC METABOLIC PANEL
ANION GAP: 10 (ref 5–15)
BUN: 40 mg/dL — ABNORMAL HIGH (ref 6–20)
CO2: 21 mmol/L — AB (ref 22–32)
Calcium: 9.4 mg/dL (ref 8.9–10.3)
Chloride: 105 mmol/L (ref 101–111)
Creatinine, Ser: 1.2 mg/dL — ABNORMAL HIGH (ref 0.44–1.00)
GFR calc Af Amer: 52 mL/min — ABNORMAL LOW (ref >60)
GFR calc non Af Amer: 45 mL/min — ABNORMAL LOW (ref >60)
GLUCOSE: 137 mg/dL — AB (ref 65–99)
POTASSIUM: 4.6 mmol/L (ref 3.5–5.1)
Sodium: 136 mmol/L (ref 135–145)

## 2017-01-10 NOTE — Progress Notes (Signed)
EKG and Labs reviewed by Dr. Royce Macadamia, will proceed with surgery as scheduled.

## 2017-01-12 ENCOUNTER — Ambulatory Visit (HOSPITAL_BASED_OUTPATIENT_CLINIC_OR_DEPARTMENT_OTHER): Payer: Medicare Other | Admitting: Anesthesiology

## 2017-01-12 ENCOUNTER — Encounter (HOSPITAL_BASED_OUTPATIENT_CLINIC_OR_DEPARTMENT_OTHER)
Admission: RE | Disposition: A | Discharge status: Home / Self Care | Payer: Self-pay | Source: Ambulatory Visit | Attending: Orthopedic Surgery

## 2017-01-12 ENCOUNTER — Encounter (HOSPITAL_BASED_OUTPATIENT_CLINIC_OR_DEPARTMENT_OTHER): Payer: Self-pay | Admitting: Anesthesiology

## 2017-01-12 ENCOUNTER — Ambulatory Visit (HOSPITAL_BASED_OUTPATIENT_CLINIC_OR_DEPARTMENT_OTHER)
Admission: RE | Admit: 2017-01-12 | Discharge: 2017-01-12 | Disposition: A | Discharge status: Home / Self Care | Payer: Medicare Other | Source: Ambulatory Visit | Attending: Orthopedic Surgery | Admitting: Orthopedic Surgery

## 2017-01-12 DIAGNOSIS — D869 Sarcoidosis, unspecified: Secondary | ICD-10-CM | POA: Diagnosis not present

## 2017-01-12 DIAGNOSIS — I10 Essential (primary) hypertension: Secondary | ICD-10-CM | POA: Insufficient documentation

## 2017-01-12 DIAGNOSIS — J449 Chronic obstructive pulmonary disease, unspecified: Secondary | ICD-10-CM | POA: Diagnosis not present

## 2017-01-12 DIAGNOSIS — Z79899 Other long term (current) drug therapy: Secondary | ICD-10-CM | POA: Insufficient documentation

## 2017-01-12 DIAGNOSIS — K219 Gastro-esophageal reflux disease without esophagitis: Secondary | ICD-10-CM | POA: Diagnosis not present

## 2017-01-12 DIAGNOSIS — K589 Irritable bowel syndrome without diarrhea: Secondary | ICD-10-CM | POA: Insufficient documentation

## 2017-01-12 DIAGNOSIS — G4730 Sleep apnea, unspecified: Secondary | ICD-10-CM | POA: Insufficient documentation

## 2017-01-12 DIAGNOSIS — T8484XA Pain due to internal orthopedic prosthetic devices, implants and grafts, initial encounter: Secondary | ICD-10-CM | POA: Diagnosis not present

## 2017-01-12 DIAGNOSIS — F329 Major depressive disorder, single episode, unspecified: Secondary | ICD-10-CM | POA: Insufficient documentation

## 2017-01-12 DIAGNOSIS — Z6830 Body mass index (BMI) 30.0-30.9, adult: Secondary | ICD-10-CM | POA: Insufficient documentation

## 2017-01-12 DIAGNOSIS — Z7984 Long term (current) use of oral hypoglycemic drugs: Secondary | ICD-10-CM | POA: Diagnosis not present

## 2017-01-12 DIAGNOSIS — Z7951 Long term (current) use of inhaled steroids: Secondary | ICD-10-CM | POA: Diagnosis not present

## 2017-01-12 DIAGNOSIS — S52572S Other intraarticular fracture of lower end of left radius, sequela: Secondary | ICD-10-CM | POA: Diagnosis not present

## 2017-01-12 DIAGNOSIS — Z472 Encounter for removal of internal fixation device: Secondary | ICD-10-CM | POA: Insufficient documentation

## 2017-01-12 DIAGNOSIS — E119 Type 2 diabetes mellitus without complications: Secondary | ICD-10-CM | POA: Diagnosis not present

## 2017-01-12 DIAGNOSIS — E785 Hyperlipidemia, unspecified: Secondary | ICD-10-CM | POA: Diagnosis not present

## 2017-01-12 DIAGNOSIS — E669 Obesity, unspecified: Secondary | ICD-10-CM | POA: Insufficient documentation

## 2017-01-12 HISTORY — PX: HARDWARE REMOVAL: SHX979

## 2017-01-12 LAB — GLUCOSE, CAPILLARY
Glucose-Capillary: 116 mg/dL — ABNORMAL HIGH (ref 65–99)
Glucose-Capillary: 120 mg/dL — ABNORMAL HIGH (ref 65–99)

## 2017-01-12 SURGERY — REMOVAL, HARDWARE
Anesthesia: General | Site: Wrist | Laterality: Left

## 2017-01-12 MED ORDER — FENTANYL CITRATE (PF) 100 MCG/2ML IJ SOLN
INTRAMUSCULAR | Status: AC
  Filled 2017-01-12: qty 2

## 2017-01-12 MED ORDER — CHLORHEXIDINE GLUCONATE 4 % EX LIQD: 60.0000 mL | Freq: Once | CUTANEOUS | Status: DC

## 2017-01-12 MED ORDER — LACTATED RINGERS IV SOLN
INTRAVENOUS | Status: DC
  Administered 2017-01-12: 08:00:00 via INTRAVENOUS

## 2017-01-12 MED ORDER — FENTANYL CITRATE (PF) 100 MCG/2ML IJ SOLN
50.0000 ug | INTRAMUSCULAR | Status: DC | PRN
  Administered 2017-01-12 (×2): 50 ug via INTRAVENOUS

## 2017-01-12 MED ORDER — METOCLOPRAMIDE HCL 5 MG/ML IJ SOLN
10.0000 mg | Freq: Once | INTRAMUSCULAR | Status: DC | PRN
Start: 2017-01-12 — End: 2017-01-12

## 2017-01-12 MED ORDER — EPHEDRINE 5 MG/ML INJ
INTRAVENOUS | Status: AC
  Filled 2017-01-12: qty 10

## 2017-01-12 MED ORDER — BUPIVACAINE HCL (PF) 0.25 % IJ SOLN
INTRAMUSCULAR | Status: AC
  Filled 2017-01-12: qty 60

## 2017-01-12 MED ORDER — DEXAMETHASONE SODIUM PHOSPHATE 10 MG/ML IJ SOLN
INTRAMUSCULAR | Status: AC
  Filled 2017-01-12: qty 1

## 2017-01-12 MED ORDER — PROPOFOL 10 MG/ML IV BOLUS
INTRAVENOUS | Status: DC | PRN
  Administered 2017-01-12: 20 mg via INTRAVENOUS
  Administered 2017-01-12: 200 mg via INTRAVENOUS

## 2017-01-12 MED ORDER — LIDOCAINE HCL (PF) 1 % IJ SOLN
INTRAMUSCULAR | Status: AC
  Filled 2017-01-12: qty 60

## 2017-01-12 MED ORDER — LIDOCAINE HCL (PF) 0.5 % IJ SOLN
INTRAMUSCULAR | Status: AC
  Filled 2017-01-12: qty 50

## 2017-01-12 MED ORDER — ONDANSETRON HCL 4 MG/2ML IJ SOLN
INTRAMUSCULAR | Status: AC
  Filled 2017-01-12: qty 2

## 2017-01-12 MED ORDER — ONDANSETRON HCL 4 MG/2ML IJ SOLN
INTRAMUSCULAR | Status: DC | PRN
  Administered 2017-01-12: 4 mg via INTRAVENOUS

## 2017-01-12 MED ORDER — LIDOCAINE HCL (CARDIAC) 20 MG/ML IV SOLN
INTRAVENOUS | Status: DC | PRN
  Administered 2017-01-12: 40 mg via INTRAVENOUS

## 2017-01-12 MED ORDER — CEFAZOLIN SODIUM-DEXTROSE 2-4 GM/100ML-% IV SOLN
INTRAVENOUS | Status: AC
  Filled 2017-01-12: qty 100

## 2017-01-12 MED ORDER — MIDAZOLAM HCL 2 MG/2ML IJ SOLN: 1.0000 mg | INTRAMUSCULAR | Status: DC | PRN

## 2017-01-12 MED ORDER — CEFAZOLIN SODIUM-DEXTROSE 2-4 GM/100ML-% IV SOLN
2.0000 g | INTRAVENOUS | Status: AC
  Administered 2017-01-12: 2000 mg via INTRAVENOUS

## 2017-01-12 MED ORDER — HYDROCODONE-ACETAMINOPHEN 5-325 MG PO TABS: ORAL_TABLET | ORAL | 0 refills | Status: DC

## 2017-01-12 MED ORDER — DEXAMETHASONE SODIUM PHOSPHATE 4 MG/ML IJ SOLN
INTRAMUSCULAR | Status: DC | PRN
  Administered 2017-01-12: 4 mg via INTRAVENOUS

## 2017-01-12 MED ORDER — EPHEDRINE SULFATE 50 MG/ML IJ SOLN
INTRAMUSCULAR | Status: DC | PRN
  Administered 2017-01-12: 10 mg via INTRAVENOUS

## 2017-01-12 MED ORDER — LIDOCAINE 2% (20 MG/ML) 5 ML SYRINGE
INTRAMUSCULAR | Status: AC
  Filled 2017-01-12: qty 5

## 2017-01-12 MED ORDER — PROPOFOL 500 MG/50ML IV EMUL
INTRAVENOUS | Status: AC
  Filled 2017-01-12: qty 50

## 2017-01-12 MED ORDER — FENTANYL CITRATE (PF) 100 MCG/2ML IJ SOLN
25.0000 ug | INTRAMUSCULAR | Status: DC | PRN
  Administered 2017-01-12: 25 ug via INTRAVENOUS

## 2017-01-12 MED ORDER — MEPERIDINE HCL 25 MG/ML IJ SOLN: 6.2500 mg | INTRAMUSCULAR | Status: DC | PRN

## 2017-01-12 MED ORDER — BUPIVACAINE HCL (PF) 0.25 % IJ SOLN
INTRAMUSCULAR | Status: AC
  Filled 2017-01-12: qty 90

## 2017-01-12 MED ORDER — SCOPOLAMINE 1 MG/3DAYS TD PT72: 1.0000 | MEDICATED_PATCH | Freq: Once | TRANSDERMAL | Status: DC | PRN

## 2017-01-12 SURGICAL SUPPLY — 50 items
BANDAGE ACE 3X5.8 VEL STRL LF (GAUZE/BANDAGES/DRESSINGS) ×3 IMPLANT
BLADE MINI RND TIP GREEN BEAV (BLADE) IMPLANT
BLADE SURG 15 STRL LF DISP TIS (BLADE) ×2 IMPLANT
BLADE SURG 15 STRL SS (BLADE) ×6
BNDG CMPR 9X4 STRL LF SNTH (GAUZE/BANDAGES/DRESSINGS) ×1
BNDG COHESIVE 1X5 TAN STRL LF (GAUZE/BANDAGES/DRESSINGS) IMPLANT
BNDG ELASTIC 2X5.8 VLCR STR LF (GAUZE/BANDAGES/DRESSINGS) IMPLANT
BNDG ESMARK 4X9 LF (GAUZE/BANDAGES/DRESSINGS) ×2 IMPLANT
BNDG GAUZE ELAST 4 BULKY (GAUZE/BANDAGES/DRESSINGS) ×3 IMPLANT
CHLORAPREP W/TINT 26ML (MISCELLANEOUS) ×3 IMPLANT
CLOSURE WOUND 1/4X4 (GAUZE/BANDAGES/DRESSINGS)
CORDS BIPOLAR (ELECTRODE) ×3 IMPLANT
COVER BACK TABLE 60X90IN (DRAPES) ×3 IMPLANT
COVER MAYO STAND STRL (DRAPES) ×3 IMPLANT
CUFF TOURNIQUET SINGLE 18IN (TOURNIQUET CUFF) ×3 IMPLANT
DECANTER SPIKE VIAL GLASS SM (MISCELLANEOUS) ×1 IMPLANT
DRAPE EXTREMITY T 121X128X90 (DRAPE) ×3 IMPLANT
DRAPE SURG 17X23 STRL (DRAPES) ×3 IMPLANT
DRSG PAD ABDOMINAL 8X10 ST (GAUZE/BANDAGES/DRESSINGS) ×2 IMPLANT
GAUZE SPONGE 4X4 12PLY STRL (GAUZE/BANDAGES/DRESSINGS) ×3 IMPLANT
GAUZE XEROFORM 1X8 LF (GAUZE/BANDAGES/DRESSINGS) ×3 IMPLANT
GLOVE BIO SURGEON STRL SZ7.5 (GLOVE) ×3 IMPLANT
GLOVE BIOGEL PI IND STRL 7.0 (GLOVE) IMPLANT
GLOVE BIOGEL PI IND STRL 8 (GLOVE) ×1 IMPLANT
GLOVE BIOGEL PI INDICATOR 7.0 (GLOVE) ×4
GLOVE BIOGEL PI INDICATOR 8 (GLOVE) ×4
GLOVE SURG SS PI 6.5 STRL IVOR (GLOVE) ×4 IMPLANT
GOWN STRL REUS W/ TWL LRG LVL3 (GOWN DISPOSABLE) ×1 IMPLANT
GOWN STRL REUS W/TWL LRG LVL3 (GOWN DISPOSABLE) ×3
GOWN STRL REUS W/TWL XL LVL3 (GOWN DISPOSABLE) ×3 IMPLANT
NDL HYPO 25X1 1.5 SAFETY (NEEDLE) IMPLANT
NEEDLE HYPO 25X1 1.5 SAFETY (NEEDLE) IMPLANT
NS IRRIG 1000ML POUR BTL (IV SOLUTION) ×3 IMPLANT
PACK BASIN DAY SURGERY FS (CUSTOM PROCEDURE TRAY) ×3 IMPLANT
PAD CAST 3X4 CTTN HI CHSV (CAST SUPPLIES) IMPLANT
PADDING CAST ABS 4INX4YD NS (CAST SUPPLIES) ×2
PADDING CAST ABS COTTON 4X4 ST (CAST SUPPLIES) ×1 IMPLANT
PADDING CAST COTTON 3X4 STRL (CAST SUPPLIES)
SPLINT PLASTER CAST XFAST 3X15 (CAST SUPPLIES) IMPLANT
SPLINT PLASTER XTRA FASTSET 3X (CAST SUPPLIES)
STOCKINETTE 4X48 STRL (DRAPES) ×3 IMPLANT
STRIP CLOSURE SKIN 1/4X4 (GAUZE/BANDAGES/DRESSINGS) IMPLANT
SUT ETHILON 4 0 PS 2 18 (SUTURE) ×3 IMPLANT
SUT MNCRL AB 4-0 PS2 18 (SUTURE) IMPLANT
SUT VIC AB 3-0 FS2 27 (SUTURE) IMPLANT
SUT VICRYL 4-0 PS2 18IN ABS (SUTURE) ×2 IMPLANT
SYR BULB 3OZ (MISCELLANEOUS) ×3 IMPLANT
SYR CONTROL 10ML LL (SYRINGE) IMPLANT
TOWEL OR 17X24 6PK STRL BLUE (TOWEL DISPOSABLE) ×6 IMPLANT
UNDERPAD 30X30 (UNDERPADS AND DIAPERS) ×3 IMPLANT

## 2017-01-12 NOTE — Anesthesia Preprocedure Evaluation (Signed)
Anesthesia Evaluation  Patient identified by MRN, date of birth, ID band Patient awake    Reviewed: Allergy & Precautions, NPO status , Patient's Chart, lab work & pertinent test results  Airway Mallampati: II  TM Distance: >3 FB Neck ROM: Full    Dental no notable dental hx. (+) Teeth Intact, Caps   Pulmonary asthma , sleep apnea , COPD,  COPD inhaler,  Sarcoidosis   Pulmonary exam normal breath sounds clear to auscultation       Cardiovascular hypertension, Pt. on medications Normal cardiovascular exam+ Valvular Problems/Murmurs  Rhythm:Regular Rate:Normal     Neuro/Psych PSYCHIATRIC DISORDERS Anxiety Depression  Neuromuscular disease    GI/Hepatic Neg liver ROS, hiatal hernia, GERD  Medicated and Controlled,  Endo/Other  diabetes, Well Controlled, Type 2, Oral Hypoglycemic AgentsHyperlipidemia Obesity  Renal/GU Renal InsufficiencyRenal diseaseHx/o renal calculi  negative genitourinary   Musculoskeletal  (+) Arthritis , Osteoarthritis,  Retained hardware left wrist   Abdominal (+) + obese,   Peds  Hematology negative hematology ROS (+)   Anesthesia Other Findings   Reproductive/Obstetrics                             Anesthesia Physical Anesthesia Plan  ASA: III  Anesthesia Plan: General   Post-op Pain Management:    Induction: Intravenous  Airway Management Planned: LMA  Additional Equipment:   Intra-op Plan:   Post-operative Plan: Extubation in OR  Informed Consent: I have reviewed the patients History and Physical, chart, labs and discussed the procedure including the risks, benefits and alternatives for the proposed anesthesia with the patient or authorized representative who has indicated his/her understanding and acceptance.   Dental advisory given  Plan Discussed with: Anesthesiologist, CRNA and Surgeon  Anesthesia Plan Comments:         Anesthesia Quick  Evaluation

## 2017-01-12 NOTE — Discharge Instructions (Addendum)
[facility] Instructions °Hand Surgery ° °Wound Care: °Keep your hand elevated above the level of your heart.  Do not allow it to dangle by your side.  Keep the dressing dry and do not remove it unless your doctor advises you to do so.  He will usually change it at the time of your post-op visit.  Moving your fingers is advised to stimulate circulation but will depend on the site of your surgery.  If you have a splint applied, your doctor will advise you regarding movement. ° °Activity: °Do not drive or operate machinery today.  Rest today and then you may return to your normal activity and work as indicated by your physician. ° °Diet:  °Drink liquids today or eat a light diet.  You may resume a regular diet tomorrow.   ° °General expectations: °Pain for two to three days. °Fingers may become slightly swollen. ° °Call your doctor if any of the following occur: °Severe pain not relieved by pain medication. °Elevated temperature. °Dressing soaked with blood. °Inability to move fingers. °White or bluish color to fingers. ° ° ° °Post Anesthesia Home Care Instructions ° °Activity: °Get plenty of rest for the remainder of the day. A responsible adult should stay with you for 24 hours following the procedure.  °For the next 24 hours, DO NOT: °-Drive a car °-Operate machinery °-Drink alcoholic beverages °-Take any medication unless instructed by your physician °-Make any legal decisions or sign important papers. ° °Meals: °Start with liquid foods such as gelatin or soup. Progress to regular foods as tolerated. Avoid greasy, spicy, heavy foods. If nausea and/or vomiting occur, drink only clear liquids until the nausea and/or vomiting subsides. Call your physician if vomiting continues. ° °Special Instructions/Symptoms: °Your throat may feel dry or sore from the anesthesia or the breathing tube placed in your throat during surgery. If this causes discomfort, gargle with warm salt water. The discomfort should disappear within  24 hours. ° °If you had a scopolamine patch placed behind your ear for the management of post- operative nausea and/or vomiting: ° °1. The medication in the patch is effective for 72 hours, after which it should be removed.  Wrap patch in a tissue and discard in the trash. Wash hands thoroughly with soap and water. °2. You may remove the patch earlier than 72 hours if you experience unpleasant side effects which may include dry mouth, dizziness or visual disturbances. °3. Avoid touching the patch. Wash your hands with soap and water after contact with the patch. °  °Call your surgeon if you experience:  ° °1.  Fever over 101.0. °2.  Inability to urinate. °3.  Nausea and/or vomiting. °4.  Extreme swelling or bruising at the surgical site. °5.  Continued bleeding from the incision. °6.  Increased pain, redness or drainage from the incision. °7.  Problems related to your pain medication. °8.  Any problems and/or concerns °

## 2017-01-12 NOTE — Anesthesia Procedure Notes (Signed)
Procedure Name: LMA Insertion Performed by: Mayu Ronk W Pre-anesthesia Checklist: Patient identified, Emergency Drugs available, Suction available and Patient being monitored Patient Re-evaluated:Patient Re-evaluated prior to inductionOxygen Delivery Method: Circle system utilized Preoxygenation: Pre-oxygenation with 100% oxygen Intubation Type: IV induction Ventilation: Mask ventilation without difficulty LMA: LMA inserted LMA Size: 4.0 Number of attempts: 2 Placement Confirmation: positive ETCO2 Tube secured with: Tape Dental Injury: Teeth and Oropharynx as per pre-operative assessment        

## 2017-01-12 NOTE — Anesthesia Postprocedure Evaluation (Signed)
Anesthesia Post Note  Patient: [name]  Procedure(s) Performed: Procedure(s) (LRB): HARDWARE REMOVAL left wrist (Left)  Patient location during evaluation: PACU Anesthesia Type: General Level of consciousness: awake and alert Pain management: pain level controlled Vital Signs Assessment: post-procedure vital signs reviewed and stable Respiratory status: spontaneous breathing, nonlabored ventilation and respiratory function stable Cardiovascular status: blood pressure returned to baseline and stable Postop Assessment: no signs of nausea or vomiting Anesthetic complications: no       Last Vitals:  Vitals:   01/12/17 1015 01/12/17 1030  BP: 137/62 (!) 150/66  Pulse: 76 77  Resp: (!) 21 18  Temp:      Last Pain:  Vitals:   01/12/17 1030  TempSrc:   PainSc: 4                  Lief Palmatier A.

## 2017-01-12 NOTE — Transfer of Care (Signed)
Immediate Anesthesia Transfer of Care Note  Patient: [name]  Procedure(s) Performed: Procedure(s): HARDWARE REMOVAL left wrist (Left)  Patient Location: PACU  Anesthesia Type:General  Level of Consciousness: awake and sedated  Airway & Oxygen Therapy: Patient Spontanous Breathing and Patient connected to face mask oxygen  Post-op Assessment: Report given to RN and Post -op Vital signs reviewed and stable  Post vital signs: Reviewed and stable  Last Vitals:  Vitals:   01/12/17 0738  BP: (!) 120/44  Pulse: (!) 56  Resp: 18  Temp: 37.1 C    Last Pain:  Vitals:   01/12/17 0738  TempSrc: Oral  PainSc: 1       Patients Stated Pain Goal: 0 ([id number])  Complications: No apparent anesthesia complications

## 2017-01-12 NOTE — Op Note (Signed)
[id number] 

## 2017-01-12 NOTE — H&P (Signed)
[name] is an 71 y.o. female.   Chief Complaint: retained hardware HPI: 71 yo female with retained hardware left wrist.  Some discomfort in wrist.  She wishes to have the hardware removed to help with the discomfort and prevent other hardware issues.  Allergies:  Allergies  Allergen Reactions  . Codeine Nausea And Vomiting  . Sulfa Antibiotics Other (See Comments)    Allergic per pt's mother     Past Medical History:  Diagnosis Date  . Allergy   . Anxiety   . Cataract    bilateral and removed  . Depression   . Diaphragmatic hernia without mention of obstruction or gangrene   . DM (diabetes mellitus) ([place])   . Esophageal reflux   . Esophagitis, unspecified   . H/O adenomatous polyp of colon 2011  . Hyperlipidemia   . Incontinence of feces   . Irritable bowel syndrome   . Kidney stones   . Sarcoidosis ([place])   . Sleep apnea    no cpap now  . Status post dilation of esophageal narrowing   . Unspecified asthma(493.90)   . Unspecified essential hypertension   . Unspecified hearing loss     Past Surgical History:  Procedure Laterality Date  . BLADDER SURGERY    . CATARACT EXTRACTION Bilateral   . HEMORRHOID SURGERY    . NASAL SEPTUM SURGERY    . PARTIAL HYSTERECTOMY    . TONSILLECTOMY AND ADENOIDECTOMY    . TRIGGER FINGER RELEASE Left 05/12/2016   Procedure: RELEASE TRIGGER FINGER/A-1 PULLEY INFECTION LEFT RING TENDON SHEATH INJECT RIGHT INDEX FINGER;  Surgeon: Leanora Cover, MD;  Location: [facility];  Service: Orthopedics;  Laterality: Left;  . TUBAL LIGATION      Family History: Family History  Problem Relation Age of Onset  . Esophageal cancer Father   . Diabetes Mother   . Diabetes Maternal Grandmother   . Colon cancer Neg Hx   . Rectal cancer Neg Hx   . Stomach cancer Neg Hx     Social History:   reports that she has never smoked. She has never used smokeless tobacco. She reports that she does not drink alcohol or use  drugs.  Medications: Facility-Administered Medications Prior to Admission  Medication Dose Route Frequency Provider Last Rate Last Dose  . omalizumab Arvid Right) injection 150 mg  150 mg Subcutaneous Q28 days Jiles Prows, MD   150 mg at 12/21/16 1440   Medications Prior to Admission  Medication Sig Dispense Refill  . albuterol (PROAIR HFA) 108 (90 Base) MCG/ACT inhaler Inhale two puffs every 4-6 hours if needed for cough or wheeze 1 Inhaler 1  . atorvastatin (LIPITOR) 20 MG tablet Take 20 mg by mouth daily.    . beclomethasone (QVAR) 40 MCG/ACT inhaler Inhale into the lungs 2 (two) times daily.    . citalopram (CELEXA) 20 MG tablet Take 20 mg by mouth daily.      Marland Kitchen dicyclomine (BENTYL) 20 MG tablet Take 20 mg by mouth 2 (two) times daily.    . Ibuprofen 200 MG CAPS Take by mouth as needed.      . Liraglutide (VICTOZA [place]) Inject into the skin. 0.6 every night    . losartan (COZAAR) 100 MG tablet Take 100 mg by mouth daily.    . mometasone-formoterol (DULERA) 200-5 MCG/ACT AERO Inhale 2 puffs into the lungs 2 (two) times daily.    Marland Kitchen omeprazole (PRILOSEC) 40 MG capsule Take 1 capsule (40 mg total) by mouth  2 (two) times daily. 60 capsule 2  . pioglitazone (ACTOS) 30 MG tablet Take 30 mg by mouth daily.    . Probiotic Product (PROBIOTIC DAILY PO) Take 1 capsule by mouth daily.    Marland Kitchen spironolactone-hydrochlorothiazide (ALDACTAZIDE) 25-25 MG tablet Take 1 tablet by mouth daily.    Marland Kitchen azelastine (ASTELIN) 0.1 % nasal spray     . Calcium Polycarbophil (EQUALACTIN PO) Take 1 capsule by mouth daily.      Results for orders placed or performed during the hospital encounter of 01/12/17 (from the past 48 hour(s))  Basic metabolic panel     Status: Abnormal   Collection Time: 01/10/17 11:00 AM  Result Value Ref Range   Sodium 136 135 - 145 mmol/L   Potassium 4.6 3.5 - 5.1 mmol/L   Chloride 105 101 - 111 mmol/L   CO2 21 (L) 22 - 32 mmol/L   Glucose, Bld 137 (H) 65 - 99 mg/dL   BUN 40 (H) 6 - 20  mg/dL   Creatinine, Ser 1.20 (H) 0.44 - 1.00 mg/dL   Calcium 9.4 8.9 - 10.3 mg/dL   GFR calc non Af Amer 45 (L) >60 mL/min   GFR calc Af Amer 52 (L) >60 mL/min    Comment: (NOTE) The eGFR has been calculated using the CKD EPI equation. This calculation has not been validated in all clinical situations. eGFR's persistently <60 mL/min signify possible Chronic Kidney Disease.    Anion gap 10 5 - 15  Glucose, capillary     Status: Abnormal   Collection Time: 01/12/17  7:49 AM  Result Value Ref Range   Glucose-Capillary 116 (H) 65 - 99 mg/dL    No results found.   A comprehensive review of systems was negative.  Blood pressure (!) 120/44, pulse (!) 56, temperature 98.7 F (37.1 C), temperature source Oral, resp. rate 18, height _0  (1.549 m), weight 73 kg (161 lb), SpO2 99 %.  General appearance: alert, cooperative and appears stated age Head: Normocephalic, without obvious abnormality, atraumatic Neck: supple, symmetrical, trachea midline Resp: clear to auscultation bilaterally Cardio: regular rate and rhythm GI: non-tender Extremities: Intact sensation and capillary refill all digits.  +epl/fpl/io.  No wounds.  Pulses: 2+ and symmetric Skin: Skin color, texture, turgor normal. No rashes or lesions Neurologic: Grossly normal Incision/Wound:none  Assessment/Plan Left wrist retained hardware.  Plan removal hardware.  Non operative and operative treatment options were discussed with the patient and patient wishes to proceed with operative treatment. Risks, benefits, and alternatives of surgery were discussed and the patient agrees with the plan of care.   [name] R 01/12/2017, 8:37 AM

## 2017-01-12 NOTE — Op Note (Signed)
NAMESUMMAR, COOLE              ACCOUNT NO.:  [id number]  MEDICAL RECORD NO.:  [id number]  LOCATION:                                 FACILITY:  PHYSICIAN:  Leanora Cover, MD             DATE OF BIRTH:  DATE OF PROCEDURE:  01/12/2017 DATE OF DISCHARGE:                              OPERATIVE REPORT   PREOPERATIVE DIAGNOSIS:  Left wrist retained hardware.  POSTOPERATIVE DIAGNOSIS:  Left wrist retained hardware.  PROCEDURE:  Left wrist removal of deep hardware.  SURGEON:  Leanora Cover, MD.  ASSISTANT:  None.  ANESTHESIA:  General.  IV FLUIDS:  Per anesthesia flow sheet.  ESTIMATED BLOOD LOSS:  Minimal.  COMPLICATIONS:  None.  SPECIMENS:  None.  TOURNIQUET TIME:  35 minutes.  DISPOSITION:  Stable to PACU.  INDICATIONS:  [name] is a 71 year old female, who underwent open reduction internal fixation of the left distal radius fracture.  She has some pain in the wrist.  There was some prominence of the plate.  We discussed removal of hardware.  Risks, benefits, and alternatives of surgery were discussed including the risk of blood loss; infection; damage to nerves, vessels, tendons, ligaments, bone; failure of surgery; need for additional surgery; complications with wound healing; continued pain.  She voiced understanding of these risks, elected to proceed.  OPERATIVE COURSE:  After being identified preoperatively by myself, the patient and I agreed upon procedure and site of procedure.  Surgical site was marked.  Risks, benefits, and alternatives of surgery were reviewed and she wished to proceed.  Surgical consent had been signed. She was given IV Ancef as preoperative antibiotic prophylaxis.  She was transferred to the operating room and placed on the operating room table in supine position where left upper extremity on arm board.  General anesthesia induced by anesthesiologist.  Left upper extremity was prepped and draped in normal sterile orthopedic fashion.   Surgical pause was performed between surgeons, anesthesia, and operating room staff; and all were in agreement as to the patient, procedure, and site of procedure.  Tourniquet at the proximal aspect of the extremity was inflated to 250 mmHg after exsanguination of the limb with an Esmarch bandage.  The previous incision was followed.  This was carried into subcutaneous tissues by spreading technique.  The FPL and FCR tendons were retracted ulnarly.  The pronator quadratus had healed over top of the plate.  This was incised.  Soft tissue was freed from the plate. The screwdriver was used to remove all screws easily.  The plate was then removed.  No remaining hardware was noted.  The bone was smoothed down where it had grown up into the screw holes and recesses of the plate.  This was done using a rongeur.  No remaining bony prominence was noted.  The wound was copiously irrigated with sterile saline.  The pronator quadratus was repaired back over the bone using a 4-0 Vicryl suture.  Two interrupted Vicryl sutures were placed in subcutaneous tissues and skin was closed with 4-0 nylon in a horizontal mattress fashion.  The wound was injected with 10 mL of 0.25% plain Marcaine to aid in postoperative  analgesia.  It was then dressed with sterile Xeroform, 4x4s, and wrapped with a Kerlix bandage.  A volar splint was placed and wrapped with Kerlix and Ace bandage.  Tourniquet was deflated at 35 minutes.  Fingertips were pink with brisk capillary refill after deflation of tourniquet.  Operative drapes were broken down.  The patient was awoken from anesthesia safely.  She was transferred back to stretcher and taken to PACU in stable condition.  I will see her back in the office in 1 week for postoperative followup.  We will give her Norco 5/325 one to two p.o. q.6 hours p.r.n. pain, dispensed #20.     Leanora Cover, MD     KK/MEDQ  D:  01/12/2017  T:  01/12/2017  Job:  [id number]

## 2017-01-12 NOTE — Brief Op Note (Signed)
01/12/2017  9:39 AM  PATIENT:  Rivka Spring  71 y.o. female  PRE-OPERATIVE DIAGNOSIS:  left wrist retained hardware  POST-OPERATIVE DIAGNOSIS:  left wrist retained hardware  PROCEDURE:  Procedure(s): HARDWARE REMOVAL left wrist (Left)  SURGEON:  Surgeon(s) and Role:    * Leanora Cover, MD - Primary  PHYSICIAN ASSISTANT:   ASSISTANTS: none   ANESTHESIA:   general  EBL:  Total I/O In: 1100 [I.V.:1100] Out: -   BLOOD ADMINISTERED:none  DRAINS: none   LOCAL MEDICATIONS USED:  MARCAINE     SPECIMEN:  No Specimen  DISPOSITION OF SPECIMEN:  N/A  COUNTS:  YES  TOURNIQUET:  * Missing tourniquet times found for documented tourniquets in log:  [id number] *  DICTATION: .Other Dictation: Dictation Number [id number]  PLAN OF CARE: Discharge to home after PACU  PATIENT DISPOSITION:  PACU - hemodynamically stable.

## 2017-01-13 ENCOUNTER — Encounter (HOSPITAL_BASED_OUTPATIENT_CLINIC_OR_DEPARTMENT_OTHER): Payer: Self-pay | Admitting: Orthopedic Surgery

## 2017-01-17 DIAGNOSIS — Z8719 Personal history of other diseases of the digestive system: Secondary | ICD-10-CM | POA: Insufficient documentation

## 2017-01-17 DIAGNOSIS — Z8679 Personal history of other diseases of the circulatory system: Secondary | ICD-10-CM | POA: Insufficient documentation

## 2017-01-17 DIAGNOSIS — M170 Bilateral primary osteoarthritis of knee: Secondary | ICD-10-CM

## 2017-01-17 DIAGNOSIS — J4550 Severe persistent asthma, uncomplicated: Secondary | ICD-10-CM | POA: Diagnosis not present

## 2017-01-17 DIAGNOSIS — M19072 Primary osteoarthritis, left ankle and foot: Secondary | ICD-10-CM

## 2017-01-17 DIAGNOSIS — M19071 Primary osteoarthritis, right ankle and foot: Secondary | ICD-10-CM | POA: Insufficient documentation

## 2017-01-17 DIAGNOSIS — E559 Vitamin D deficiency, unspecified: Secondary | ICD-10-CM

## 2017-01-17 HISTORY — DX: Primary osteoarthritis, left ankle and foot: M19.071

## 2017-01-17 HISTORY — DX: Primary osteoarthritis, left ankle and foot: M19.072

## 2017-01-17 HISTORY — DX: Vitamin D deficiency, unspecified: E55.9

## 2017-01-17 HISTORY — DX: Bilateral primary osteoarthritis of knee: M17.0

## 2017-01-17 HISTORY — DX: Personal history of other diseases of the digestive system: Z87.19

## 2017-01-17 NOTE — Progress Notes (Signed)
Office Visit Note  Patient: [name]             Date of Birth: [birth year]           MRN: [id number]             PCP: Ann Held, MD Referring: Myer Peer, MD Visit Date: 01/24/2017 Occupation: @GUAROCC @    Subjective:  Pain hands   History of Present Illness: [name] is a 71 y.o. female with history of osteoarthritis she returns today after her last visit in August 2017. She states she continues to have pain and discomfort in her bilateral hands, bilateral knees and her feet. She had a fracture of her left wrist in May 2017 which continues to hurt. She's been also having some discomfort in her left arm. She occasionally feels tingling in her right fourth and fifth finger. Her lower back pain is tolerable.  Activities of Daily Living:  Patient reports morning stiffness for 10 minutes.   Patient Reports nocturnal pain.  Difficulty dressing/grooming: Denies Difficulty climbing stairs: Reports Difficulty getting out of chair: Reports Difficulty using hands for taps, buttons, cutlery, and/or writing: Denies   Review of Systems  Constitutional: Positive for fatigue. Negative for night sweats, weight gain, weight loss and weakness.  HENT: Negative for mouth sores, trouble swallowing, trouble swallowing, mouth dryness and nose dryness.   Eyes: Positive for dryness. Negative for pain, redness and visual disturbance.  Respiratory: Negative for cough, shortness of breath and difficulty breathing.   Cardiovascular: Negative for chest pain, palpitations, hypertension, irregular heartbeat and swelling in legs/feet.  Gastrointestinal: Negative for blood in stool, constipation and diarrhea.  Endocrine: Negative for increased urination.  Genitourinary: Negative for vaginal dryness.  Musculoskeletal: Positive for arthralgias, joint pain, myalgias, morning stiffness and myalgias. Negative for joint swelling, muscle weakness and muscle tenderness.  Skin: Negative for  color change, rash, hair loss, skin tightness, ulcers and sensitivity to sunlight.  Allergic/Immunologic: Negative for susceptible to infections.  Neurological: Negative for dizziness, memory loss and night sweats.  Hematological: Negative for swollen glands.  Psychiatric/Behavioral: Positive for sleep disturbance. Negative for depressed mood. The patient is not nervous/anxious.     PMFS History:  Patient Active Problem List   Diagnosis Date Noted  . Spondylosis of lumbar region without myelopathy or radiculopathy 01/23/2017  . History of diabetes mellitus 01/23/2017  . History of depression 01/23/2017  . History of asthma 01/23/2017  . Dyslipidemia 01/23/2017  . Age-related osteoporosis without current pathological fracture 01/23/2017  . History of gastroesophageal reflux (GERD) 01/17/2017  . History of hypertension 01/17/2017  . Primary osteoarthritis of both knees 01/17/2017  . Primary osteoarthritis of both feet 01/17/2017  . Vitamin D deficiency 01/17/2017  . Rheumatoid factor positive 12/13/2016  . Primary osteoarthritis of both hands 12/13/2016  . Obstructive lung disease ([place]) 07/29/2015  . H/O allergic rhinitis 07/29/2015  . LPRD (laryngopharyngeal reflux disease) 07/29/2015  . Chest pain 11/01/2013  . Murmur 11/01/2013  . DM 03/03/2010  . ABDOMINAL PAIN RIGHT LOWER QUADRANT 03/03/2010  . FULL INCONTINENCE OF FECES 03/03/2010  . DECREASED HEARING 05/02/2008  . HYPERTENSION 05/02/2008  . ESOPHAGITIS 05/02/2008  . HIATAL HERNIA 05/02/2008  . RECTAL INCONTINENCE 05/02/2008  . SARCOIDOSIS 11/16/2007  . Severe persistent asthma 11/16/2007  . GERD 11/16/2007  . IRRITABLE BOWEL SYNDROME 11/16/2007    Past Medical History:  Diagnosis Date  . Allergy   . Anxiety   . Cataract    bilateral and removed  .  Depression   . Diaphragmatic hernia without mention of obstruction or gangrene   . DM (diabetes mellitus) ([place])   . Esophageal reflux   . Esophagitis, unspecified     . H/O adenomatous polyp of colon 2011  . Hyperlipidemia   . Incontinence of feces   . Irritable bowel syndrome   . Kidney stones   . Sarcoidosis ([place])   . Sleep apnea    no cpap now  . Status post dilation of esophageal narrowing   . Unspecified asthma(493.90)   . Unspecified essential hypertension   . Unspecified hearing loss     Family History  Problem Relation Age of Onset  . Esophageal cancer Father   . Diabetes Mother   . Diabetes Maternal Grandmother   . Colon cancer Neg Hx   . Rectal cancer Neg Hx   . Stomach cancer Neg Hx    Past Surgical History:  Procedure Laterality Date  . BLADDER SURGERY    . CATARACT EXTRACTION Bilateral   . HARDWARE REMOVAL Left 01/12/2017   Procedure: HARDWARE REMOVAL left wrist;  Surgeon: Leanora Cover, MD;  Location: [facility];  Service: Orthopedics;  Laterality: Left;  . HEMORRHOID SURGERY    . NASAL SEPTUM SURGERY    . PARTIAL HYSTERECTOMY    . TONSILLECTOMY AND ADENOIDECTOMY    . TRIGGER FINGER RELEASE Left 05/12/2016   Procedure: RELEASE TRIGGER FINGER/A-1 PULLEY INFECTION LEFT RING TENDON SHEATH INJECT RIGHT INDEX FINGER;  Surgeon: Leanora Cover, MD;  Location: [facility];  Service: Orthopedics;  Laterality: Left;  . TUBAL LIGATION     Social History   Social History Narrative   Daily caffeine use.      Objective: Vital Signs: BP 124/68   Pulse 78   Resp 16   Ht 5' 1.5" (1.562 m)   Wt 160 lb (72.6 kg)   BMI 29.74 kg/m    Physical Exam  Constitutional: She is oriented to person, place, and time. She appears well-developed and well-nourished.  HENT:  Head: Normocephalic and atraumatic.  Eyes: Conjunctivae and EOM are normal.  Neck: Normal range of motion.  Cardiovascular: Normal rate, regular rhythm, normal heart sounds and intact distal pulses.   Pulmonary/Chest: Effort normal and breath sounds normal.  Abdominal: Soft. Bowel sounds are normal.  Lymphadenopathy:    She has no cervical  adenopathy.  Neurological: She is alert and oriented to person, place, and time.  Skin: Skin is warm and dry. Capillary refill takes less than 2 seconds.  Psychiatric: She has a normal mood and affect. Her behavior is normal.  Nursing note and vitals reviewed.    Musculoskeletal Exam: C-spine good range of motion she has thoracic kyphosis limited range of motion of her lumbar spine she has some discomfort with range of motion of her left shoulder joint. Her left wrist was in a brace . Right hand MCPs PIPs DIPs with good range of motion. Some thickening of PIP/DIP and joints were noted consistent with osteoarthritis. Hip joints knee joints ankles MTPs PIPs with good range of motion thickening of PIP/DIP joints in her feet was noted. No synovitis was noted.  CDAI Exam: No CDAI exam completed.    Investigation: Findings:  Lab from 12-2100, ANA was negative, CBC was normal, CMP showed elevated glucose of 150, uric acid was normal. She had parvo IgG was positive this showed past infection.  Her RF was positive at 50 and ESR was 43.    Labs from 06/29/2016 show TIBC  is normal.  CMP with GFR is normal except for slight elevation of creatinine at 1.07, slight decrease in GFR at 53.  We will monitor.  CBC with diff shows mild anemia.  Otherwise normal with sed rate normal at 22.  Uric acid is normal at 4.2.  Magnesium is normal 1.9.  Ferritin is normal. Rheumatoid factor is positive, but CCP is negative and 14-3-3 ETA is negative. Vitamin D is low at 25  06/29/2016   x-rays of bilateral hands 2 views today which showed bilateral PIP, DIP, CMC narrowing and bilateral 2nd and 3rd MCP narrowing, osteopenia and left radius hardware, right radius old callus.  These findings are consistent with osteoarthritis although MCP narrowing could be seen and inflammatory arthritis.  Bilateral knee joint x-rays showed bilateral moderate medial compartment narrowing worse on the left than the right consistent with  osteoarthritis, no chondrocalcinosis was noted.  She has bilateral severe patellofemoral narrowing and this is consistent with chondromalacia of patella.   08/03/2016 Ms. Carducci was here to get ultrasound examination of bilateral hands.  After informed consent was obtained, per EULAR recommendations, ultrasound examination of bilateral hand was performed using 12 MHz transducer, grey scale and power Doppler.  Bilateral 2nd, 3rd and 5th MCP joints and bilateral wrist joints, both dorsal and volar aspects were evaluated to look for synovitis and tenosynovitis.  The findings were she had no synovial thickening, no synovitis or tenosynovitis on examination.  Right median nerve was 0.7 cm 2 and left was 0.1 cm 2, which were within normal limits.   IMPRESSION AND PLAN:  She had no evidence of inflammatory arthritis.  She has had a history of positive rheumatoid factor in the past and her clinical examination is based on osteoarthritis.  She will be returning for followup as scheduled.   Admission on 01/12/2017, Discharged on 01/12/2017  Component Date Value Ref Range Status  . Sodium 01/10/2017 136  135 - 145 mmol/L Final  . Potassium 01/10/2017 4.6  3.5 - 5.1 mmol/L Final  . Chloride 01/10/2017 105  101 - 111 mmol/L Final  . CO2 01/10/2017 21* 22 - 32 mmol/L Final  . Glucose, Bld 01/10/2017 137* 65 - 99 mg/dL Final  . BUN 01/10/2017 40* 6 - 20 mg/dL Final  . Creatinine, Ser 01/10/2017 1.20* 0.44 - 1.00 mg/dL Final  . Calcium 01/10/2017 9.4  8.9 - 10.3 mg/dL Final  . GFR calc non Af Amer 01/10/2017 45* >60 mL/min Final  . GFR calc Af Amer 01/10/2017 52* >60 mL/min Final   Comment: (NOTE) The eGFR has been calculated using the CKD EPI equation. This calculation has not been validated in all clinical situations. eGFR's persistently <60 mL/min signify possible Chronic Kidney Disease.   . Anion gap 01/10/2017 10  5 - 15 Final  . Glucose-Capillary 01/12/2017 116* 65 - 99 mg/dL Final  .  Glucose-Capillary 01/12/2017 120* 65 - 99 mg/dL Final     Imaging: No results found.  Speciality Comments: No specialty comments available.    Procedures:  No procedures performed Allergies: Codeine and Sulfa antibiotics   Assessment / Plan:     Visit Diagnoses: Primary osteoarthritis of both hands - History of left wrist joint fracture May 2017, surgery by Dr. Fredna Dow July 2017. She had recent surgery on her left wrist for removal of plate.  Primary osteoarthritis of both knees: She continues to have a lot of discomfort in her knee joints and requested cortisone injection to her left knee. I decided to postpone  the injection as she had recent surgery and it will interfere with the healing process. I've advised her to contact us in case her symptoms Worsen she will need injection in future. Weight loss diet and exercise was also discussed at length.  Primary osteoarthritis of both feet: She has discomfort proper fitting shoes were discussed.  Rheumatoid factor positive - Negative CCP, negatives 14 33 eta, negative ANA, HLA-B27 negative, no synovitis on exam  Spondylosis of lumbar region without myelopathy or radiculopathy: She has chronic pain I believe weight loss would be helpful with her lower back pain as well reviewed  Her other medical problems are listed as follows  H/O allergic rhinitis  History of gastroesophageal reflux (GERD)  History of hypertension  Vitamin D deficiency: She is on vitamin D supplement  Age-related osteoporosis without current pathological fracture: Patient gives history of osteoporosis I do not see that she is on a bisphosphonate or any other treatment for osteoporosis. I've advised to discuss that further with PCP.  History of diabetes mellitus  History of depression  History of asthma  Dyslipidemia    Orders: No orders of the defined types were placed in this encounter.  No orders of the defined types were placed in this  encounter.   Face-to-face time spent with patient was 30 minutes. 50% of time was spent in counseling and coordination of care.  Follow-Up Instructions: Return in about 6 months (around 07/24/2017) for Osteoarthritis.   Bo Merino, MD  Note - This record has been created using Editor, commissioning.  Chart creation errors have been sought, but may not always  have been located. Such creation errors do not reflect on  the standard of medical care.

## 2017-01-18 ENCOUNTER — Ambulatory Visit (INDEPENDENT_AMBULATORY_CARE_PROVIDER_SITE_OTHER): Payer: Medicare Other | Admitting: *Deleted

## 2017-01-18 DIAGNOSIS — J4550 Severe persistent asthma, uncomplicated: Secondary | ICD-10-CM | POA: Diagnosis not present

## 2017-01-20 DIAGNOSIS — Z4789 Encounter for other orthopedic aftercare: Secondary | ICD-10-CM | POA: Diagnosis not present

## 2017-01-23 DIAGNOSIS — Z8709 Personal history of other diseases of the respiratory system: Secondary | ICD-10-CM

## 2017-01-23 DIAGNOSIS — E785 Hyperlipidemia, unspecified: Secondary | ICD-10-CM

## 2017-01-23 DIAGNOSIS — Z8659 Personal history of other mental and behavioral disorders: Secondary | ICD-10-CM

## 2017-01-23 DIAGNOSIS — Z8639 Personal history of other endocrine, nutritional and metabolic disease: Secondary | ICD-10-CM

## 2017-01-23 DIAGNOSIS — M51369 Other intervertebral disc degeneration, lumbar region without mention of lumbar back pain or lower extremity pain: Secondary | ICD-10-CM

## 2017-01-23 DIAGNOSIS — M5136 Other intervertebral disc degeneration, lumbar region: Secondary | ICD-10-CM

## 2017-01-23 DIAGNOSIS — M810 Age-related osteoporosis without current pathological fracture: Secondary | ICD-10-CM

## 2017-01-23 HISTORY — DX: Hyperlipidemia, unspecified: E78.5

## 2017-01-23 HISTORY — DX: Personal history of other endocrine, nutritional and metabolic disease: Z86.39

## 2017-01-23 HISTORY — DX: Personal history of other diseases of the respiratory system: Z87.09

## 2017-01-23 HISTORY — DX: Other intervertebral disc degeneration, lumbar region: M51.36

## 2017-01-23 HISTORY — DX: Personal history of other mental and behavioral disorders: Z86.59

## 2017-01-23 HISTORY — DX: Other intervertebral disc degeneration, lumbar region without mention of lumbar back pain or lower extremity pain: M51.369

## 2017-01-23 HISTORY — DX: Age-related osteoporosis without current pathological fracture: M81.0

## 2017-01-24 ENCOUNTER — Encounter: Payer: Self-pay | Admitting: Rheumatology

## 2017-01-24 ENCOUNTER — Ambulatory Visit (INDEPENDENT_AMBULATORY_CARE_PROVIDER_SITE_OTHER): Payer: Medicare Other | Admitting: Rheumatology

## 2017-01-24 ENCOUNTER — Other Ambulatory Visit: Payer: Self-pay | Admitting: Allergy and Immunology

## 2017-01-24 VITALS — BP 124/68 | HR 78 | Resp 16 | Ht 61.5 in | Wt 160.0 lb

## 2017-01-24 DIAGNOSIS — M170 Bilateral primary osteoarthritis of knee: Secondary | ICD-10-CM

## 2017-01-24 DIAGNOSIS — E785 Hyperlipidemia, unspecified: Secondary | ICD-10-CM

## 2017-01-24 DIAGNOSIS — M19041 Primary osteoarthritis, right hand: Secondary | ICD-10-CM | POA: Diagnosis not present

## 2017-01-24 DIAGNOSIS — M19042 Primary osteoarthritis, left hand: Secondary | ICD-10-CM | POA: Diagnosis not present

## 2017-01-24 DIAGNOSIS — Z8679 Personal history of other diseases of the circulatory system: Secondary | ICD-10-CM | POA: Diagnosis not present

## 2017-01-24 DIAGNOSIS — Z8709 Personal history of other diseases of the respiratory system: Secondary | ICD-10-CM | POA: Diagnosis not present

## 2017-01-24 DIAGNOSIS — M810 Age-related osteoporosis without current pathological fracture: Secondary | ICD-10-CM

## 2017-01-24 DIAGNOSIS — R768 Other specified abnormal immunological findings in serum: Secondary | ICD-10-CM

## 2017-01-24 DIAGNOSIS — Z8659 Personal history of other mental and behavioral disorders: Secondary | ICD-10-CM

## 2017-01-24 DIAGNOSIS — Z8639 Personal history of other endocrine, nutritional and metabolic disease: Secondary | ICD-10-CM

## 2017-01-24 DIAGNOSIS — Z8719 Personal history of other diseases of the digestive system: Secondary | ICD-10-CM | POA: Diagnosis not present

## 2017-01-24 DIAGNOSIS — E559 Vitamin D deficiency, unspecified: Secondary | ICD-10-CM

## 2017-01-24 DIAGNOSIS — M47816 Spondylosis without myelopathy or radiculopathy, lumbar region: Secondary | ICD-10-CM

## 2017-01-24 DIAGNOSIS — M19072 Primary osteoarthritis, left ankle and foot: Secondary | ICD-10-CM

## 2017-01-24 DIAGNOSIS — M19071 Primary osteoarthritis, right ankle and foot: Secondary | ICD-10-CM

## 2017-02-08 DIAGNOSIS — M25562 Pain in left knee: Secondary | ICD-10-CM | POA: Diagnosis not present

## 2017-02-14 DIAGNOSIS — J4550 Severe persistent asthma, uncomplicated: Secondary | ICD-10-CM | POA: Diagnosis not present

## 2017-02-15 ENCOUNTER — Ambulatory Visit (INDEPENDENT_AMBULATORY_CARE_PROVIDER_SITE_OTHER): Payer: Medicare Other | Admitting: *Deleted

## 2017-02-15 DIAGNOSIS — J4550 Severe persistent asthma, uncomplicated: Secondary | ICD-10-CM

## 2017-02-15 DIAGNOSIS — S52552D Other extraarticular fracture of lower end of left radius, subsequent encounter for closed fracture with routine healing: Secondary | ICD-10-CM | POA: Diagnosis not present

## 2017-02-15 MED ORDER — MOMETASONE FURO-FORMOTEROL FUM 200-5 MCG/ACT IN AERO: INHALATION_SPRAY | RESPIRATORY_TRACT | 5 refills | Status: DC

## 2017-02-15 MED ORDER — ALBUTEROL SULFATE HFA 108 (90 BASE) MCG/ACT IN AERS: INHALATION_SPRAY | RESPIRATORY_TRACT | 1 refills | Status: DC

## 2017-02-16 ENCOUNTER — Telehealth: Payer: Self-pay

## 2017-02-16 MED ORDER — BUDESONIDE-FORMOTEROL FUMARATE 160-4.5 MCG/ACT IN AERO: 2.0000 | INHALATION_SPRAY | Freq: Two times a day (BID) | RESPIRATORY_TRACT | 5 refills | Status: DC

## 2017-02-16 NOTE — Telephone Encounter (Signed)
Change Dulera to Symbicort 160 per Dr. Neldon Mc due to insurance formulary. RX sent.

## 2017-02-17 DIAGNOSIS — I10 Essential (primary) hypertension: Secondary | ICD-10-CM | POA: Diagnosis not present

## 2017-02-17 DIAGNOSIS — G4733 Obstructive sleep apnea (adult) (pediatric): Secondary | ICD-10-CM | POA: Diagnosis not present

## 2017-02-17 DIAGNOSIS — M810 Age-related osteoporosis without current pathological fracture: Secondary | ICD-10-CM | POA: Diagnosis not present

## 2017-02-17 DIAGNOSIS — R319 Hematuria, unspecified: Secondary | ICD-10-CM | POA: Diagnosis not present

## 2017-02-17 DIAGNOSIS — E119 Type 2 diabetes mellitus without complications: Secondary | ICD-10-CM | POA: Diagnosis not present

## 2017-02-20 DIAGNOSIS — J111 Influenza due to unidentified influenza virus with other respiratory manifestations: Secondary | ICD-10-CM | POA: Diagnosis not present

## 2017-02-20 DIAGNOSIS — A0811 Acute gastroenteropathy due to Norwalk agent: Secondary | ICD-10-CM | POA: Diagnosis not present

## 2017-02-22 DIAGNOSIS — Z1211 Encounter for screening for malignant neoplasm of colon: Secondary | ICD-10-CM | POA: Diagnosis not present

## 2017-02-22 DIAGNOSIS — M79642 Pain in left hand: Secondary | ICD-10-CM | POA: Diagnosis not present

## 2017-02-27 ENCOUNTER — Other Ambulatory Visit: Payer: Self-pay | Admitting: *Deleted

## 2017-02-27 MED ORDER — BUDESONIDE-FORMOTEROL FUMARATE 160-4.5 MCG/ACT IN AERO: INHALATION_SPRAY | RESPIRATORY_TRACT | 5 refills | Status: DC

## 2017-03-14 DIAGNOSIS — J4550 Severe persistent asthma, uncomplicated: Secondary | ICD-10-CM

## 2017-03-15 ENCOUNTER — Ambulatory Visit (INDEPENDENT_AMBULATORY_CARE_PROVIDER_SITE_OTHER): Payer: Medicare Other | Admitting: *Deleted

## 2017-03-15 DIAGNOSIS — H90A31 Mixed conductive and sensorineural hearing loss, unilateral, right ear with restricted hearing on the contralateral side: Secondary | ICD-10-CM | POA: Diagnosis not present

## 2017-03-15 DIAGNOSIS — J4550 Severe persistent asthma, uncomplicated: Secondary | ICD-10-CM

## 2017-03-21 DIAGNOSIS — Z1231 Encounter for screening mammogram for malignant neoplasm of breast: Secondary | ICD-10-CM | POA: Diagnosis not present

## 2017-03-22 ENCOUNTER — Other Ambulatory Visit: Payer: Self-pay | Admitting: Allergy and Immunology

## 2017-03-31 DIAGNOSIS — H90A31 Mixed conductive and sensorineural hearing loss, unilateral, right ear with restricted hearing on the contralateral side: Secondary | ICD-10-CM | POA: Diagnosis not present

## 2017-03-31 DIAGNOSIS — H9190 Unspecified hearing loss, unspecified ear: Secondary | ICD-10-CM | POA: Diagnosis not present

## 2017-03-31 DIAGNOSIS — H90A22 Sensorineural hearing loss, unilateral, left ear, with restricted hearing on the contralateral side: Secondary | ICD-10-CM | POA: Diagnosis not present

## 2017-04-11 DIAGNOSIS — J4550 Severe persistent asthma, uncomplicated: Secondary | ICD-10-CM | POA: Diagnosis not present

## 2017-04-12 ENCOUNTER — Ambulatory Visit (INDEPENDENT_AMBULATORY_CARE_PROVIDER_SITE_OTHER): Payer: Medicare Other | Admitting: *Deleted

## 2017-04-12 DIAGNOSIS — J4550 Severe persistent asthma, uncomplicated: Secondary | ICD-10-CM | POA: Diagnosis not present

## 2017-04-13 ENCOUNTER — Ambulatory Visit (INDEPENDENT_AMBULATORY_CARE_PROVIDER_SITE_OTHER): Payer: Self-pay

## 2017-04-13 ENCOUNTER — Encounter (INDEPENDENT_AMBULATORY_CARE_PROVIDER_SITE_OTHER): Payer: Self-pay | Admitting: Orthopaedic Surgery

## 2017-04-13 ENCOUNTER — Ambulatory Visit (INDEPENDENT_AMBULATORY_CARE_PROVIDER_SITE_OTHER): Payer: Medicare Other | Admitting: Orthopaedic Surgery

## 2017-04-13 VITALS — BP 122/61 | HR 70 | Ht 62.0 in | Wt 145.0 lb

## 2017-04-13 DIAGNOSIS — M25512 Pain in left shoulder: Secondary | ICD-10-CM

## 2017-04-13 DIAGNOSIS — G8929 Other chronic pain: Secondary | ICD-10-CM

## 2017-04-13 MED ORDER — BUPIVACAINE HCL 0.5 % IJ SOLN
2.0000 mL | INTRAMUSCULAR | Status: AC | PRN
  Administered 2017-04-13: 2 mL via INTRA_ARTICULAR

## 2017-04-13 MED ORDER — LIDOCAINE HCL 1 % IJ SOLN
2.0000 mL | INTRAMUSCULAR | Status: AC | PRN
  Administered 2017-04-13: 2 mL

## 2017-04-13 MED ORDER — METHYLPREDNISOLONE ACETATE 40 MG/ML IJ SUSP
80.0000 mg | INTRAMUSCULAR | Status: AC | PRN
  Administered 2017-04-13: 80 mg

## 2017-04-13 NOTE — Progress Notes (Signed)
Office Visit Note   Patient: [name]           Date of Birth: November 26, 1946           MRN: [id number] Visit Date: 04/13/2017              Requested by: Myer Peer, MD [place], [place] [id number] PCP: Myer Peer, MD   Assessment & Plan: Visit Diagnoses:  1. Chronic left shoulder pain   Osteo- arthritis left shoulder. Long discussion regarding x-ray findings from September 2017 and treatment options. I've also discussed shoulder replacement. She like to have another shot of cortisone today.  Follow-Up Instructions: Return if symptoms worsen or fail to improve.   Orders:  No orders of the defined types were placed in this encounter.  No orders of the defined types were placed in this encounter.     Procedures: Large Joint Inj Date/Time: 04/13/2017 11:07 AM Performed by: Garald Balding Authorized by: Garald Balding   Consent Given by:  Patient Timeout: prior to procedure the correct patient, procedure, and site was verified   Indications:  Pain Location:  Shoulder Site:  L glenohumeral Prep: patient was prepped and draped in usual sterile fashion   Needle Size:  25 G Needle Length:  1.5 inches Approach:  Posterior Ultrasound Guidance: No   Fluoroscopic Guidance: No   Arthrogram: No   Medications:  2 mL lidocaine 1 %; 2 mL bupivacaine 0.5 %; 80 mg methylPREDNISolone acetate 40 MG/ML Aspiration Attempted: No   Patient tolerance:  Patient tolerated the procedure well with no immediate complications     Clinical Data: No additional findings.   Subjective: Chief Complaint  Patient presents with  . Left Shoulder - Pain    [name] is a 71 y o that presents with L shoulder pain x 8 months. SHe relates is hurts, limited ROM behind her back and overhead. Pain radiates all over the shoulder into the bicep.Hx of L wrist surgery 01/12/17.   Had a cortisone injection left shoulder in September that provided temporary relief of her  pain.  She's had recurrent pain without injury or trauma. Having some difficulty with certain positions of her shoulder and limited range of motion. Denies numbness or tingling.  HPI  Review of Systems   Objective: Vital Signs: BP 122/61   Pulse 70   Ht 5\' 2"  (1.575 m)   Wt 145 lb (65.8 kg)   BMI 26.52 kg/m   Physical Exam  Ortho Exam left shoulder exam lacking about 30 of full overhead motion. Approximately 30 of external rotation and 70 of internal rotation. Some crepitation with shoulder motion. No obvious weakness. No ecchymosis except along her arm and forearm where she recently "fell". Pain at the acromioclavicular joint. Some pain along the glenohumeral joint anteriorly.  No specialty comments available.  Imaging: No results found.   PMFS History: Patient Active Problem List   Diagnosis Date Noted  . Spondylosis of lumbar region without myelopathy or radiculopathy 01/23/2017  . History of diabetes mellitus 01/23/2017  . History of depression 01/23/2017  . History of asthma 01/23/2017  . Dyslipidemia 01/23/2017  . Age-related osteoporosis without current pathological fracture 01/23/2017  . History of gastroesophageal reflux (GERD) 01/17/2017  . History of hypertension 01/17/2017  . Primary osteoarthritis of both knees 01/17/2017  . Primary osteoarthritis of both feet 01/17/2017  . Vitamin D deficiency 01/17/2017  . Rheumatoid factor positive 12/13/2016  . Primary osteoarthritis of  both hands 12/13/2016  . Obstructive lung disease ([place]) 07/29/2015  . H/O allergic rhinitis 07/29/2015  . LPRD (laryngopharyngeal reflux disease) 07/29/2015  . Chest pain 11/01/2013  . Murmur 11/01/2013  . DM 03/03/2010  . ABDOMINAL PAIN RIGHT LOWER QUADRANT 03/03/2010  . FULL INCONTINENCE OF FECES 03/03/2010  . DECREASED HEARING 05/02/2008  . HYPERTENSION 05/02/2008  . ESOPHAGITIS 05/02/2008  . HIATAL HERNIA 05/02/2008  . RECTAL INCONTINENCE 05/02/2008  . SARCOIDOSIS 11/16/2007   . Severe persistent asthma 11/16/2007  . GERD 11/16/2007  . IRRITABLE BOWEL SYNDROME 11/16/2007   Past Medical History:  Diagnosis Date  . Allergy   . Anxiety   . Cataract    bilateral and removed  . Depression   . Diaphragmatic hernia without mention of obstruction or gangrene   . DM (diabetes mellitus) ([place])   . Esophageal reflux   . Esophagitis, unspecified   . H/O adenomatous polyp of colon 2011  . Hyperlipidemia   . Incontinence of feces   . Irritable bowel syndrome   . Kidney stones   . Sarcoidosis   . Sleep apnea    no cpap now  . Status post dilation of esophageal narrowing   . Unspecified asthma(493.90)   . Unspecified essential hypertension   . Unspecified hearing loss     Family History  Problem Relation Age of Onset  . Esophageal cancer Father   . Diabetes Mother   . Diabetes Maternal Grandmother   . Colon cancer Neg Hx   . Rectal cancer Neg Hx   . Stomach cancer Neg Hx     Past Surgical History:  Procedure Laterality Date  . BLADDER SURGERY    . CATARACT EXTRACTION Bilateral   . HARDWARE REMOVAL Left 01/12/2017   Procedure: HARDWARE REMOVAL left wrist;  Surgeon: Leanora Cover, MD;  Location: [facility];  Service: Orthopedics;  Laterality: Left;  . HEMORRHOID SURGERY    . NASAL SEPTUM SURGERY    . PARTIAL HYSTERECTOMY    . TONSILLECTOMY AND ADENOIDECTOMY    . TRIGGER FINGER RELEASE Left 05/12/2016   Procedure: RELEASE TRIGGER FINGER/A-1 PULLEY INFECTION LEFT RING TENDON SHEATH INJECT RIGHT INDEX FINGER;  Surgeon: Leanora Cover, MD;  Location: [facility];  Service: Orthopedics;  Laterality: Left;  . TUBAL LIGATION     Social History   Occupational History  . retired    Social History Main Topics  . Smoking status: Never Smoker  . Smokeless tobacco: Never Used  . Alcohol use No  . Drug use: No  . Sexual activity: Not on file     Garald Balding, MD   Note - This record has been created using [employer].    Chart creation errors have been sought, but may not always  have been located. Such creation errors do not reflect on  the standard of medical care.

## 2017-04-21 ENCOUNTER — Ambulatory Visit (INDEPENDENT_AMBULATORY_CARE_PROVIDER_SITE_OTHER): Payer: Medicare Other | Admitting: Orthopaedic Surgery

## 2017-04-28 ENCOUNTER — Ambulatory Visit: Payer: Medicare Other | Admitting: Allergy and Immunology

## 2017-04-28 DIAGNOSIS — J4551 Severe persistent asthma with (acute) exacerbation: Secondary | ICD-10-CM | POA: Diagnosis not present

## 2017-05-09 DIAGNOSIS — J4550 Severe persistent asthma, uncomplicated: Secondary | ICD-10-CM

## 2017-05-10 ENCOUNTER — Ambulatory Visit (INDEPENDENT_AMBULATORY_CARE_PROVIDER_SITE_OTHER): Payer: Medicare Other

## 2017-05-10 DIAGNOSIS — J4550 Severe persistent asthma, uncomplicated: Secondary | ICD-10-CM | POA: Diagnosis not present

## 2017-05-11 ENCOUNTER — Encounter: Payer: Self-pay | Admitting: Allergy and Immunology

## 2017-05-11 ENCOUNTER — Ambulatory Visit (INDEPENDENT_AMBULATORY_CARE_PROVIDER_SITE_OTHER): Payer: Medicare Other | Admitting: Allergy and Immunology

## 2017-05-11 VITALS — BP 118/64 | HR 80 | Resp 20

## 2017-05-11 DIAGNOSIS — K219 Gastro-esophageal reflux disease without esophagitis: Secondary | ICD-10-CM | POA: Diagnosis not present

## 2017-05-11 DIAGNOSIS — J3089 Other allergic rhinitis: Secondary | ICD-10-CM

## 2017-05-11 DIAGNOSIS — J4550 Severe persistent asthma, uncomplicated: Secondary | ICD-10-CM | POA: Diagnosis not present

## 2017-05-11 MED ORDER — BECLOMETHASONE DIPROP HFA 80 MCG/ACT IN AERB: INHALATION_SPRAY | RESPIRATORY_TRACT | 0 refills | Status: DC

## 2017-05-11 NOTE — Patient Instructions (Addendum)
  1. Continue Xolair and Epi-Pen  2. Continue Dulera 200 two inhalations two times per day with spacer  3. Add SAMPLES of Qvar 80 REDIHALER two inhalations two times per day to [facility] during "flare up".  4. Continue Flonase - one spray each nostril 1-2 times per day  5. Continue omeprazole 40 twice a day + ranitidine 300 in evening  6. Continue nasal saline, ProAir HFA, Duoneb if needed.  7. Can add OTC Loratadine / Claritin 10 mg one tablet 1-2 times per day if needed  8. Can add OTC Mucinex DM 1-2 tablet 1-2 times per day if needed  9. Return in 6 months or earlier if problem.

## 2017-05-11 NOTE — Progress Notes (Signed)
Follow-up Note  Referring Provider: Myer Peer, MD Primary Provider: Myer Peer, MD Date of Office Visit: 05/11/2017  Subjective:   [name] (DOB: 04/14/1946) is a 71 y.o. female who returns to the [facility] on 05/11/2017 in re-evaluation of the following:  HPI: [name] returns to this clinic in reevaluation of her COPD with severe asthma and allergic rhinitis and LPR. Her last visit in this clinic was January 2018.  Overall she was doing very well without the requirement for systemic steroid or antibiotic until approximately 3 weeks ago. At that point time she developed nasal congestion and nose blowing and wheezing and coughing and visited with Dr. Nicki Reaper who treated her with prednisone and azithromycin. She is slowly improved and today is really the best day that she has had during this past 2 weeks. Her nose has basically cleared up although she still has some coughing on occasion.  She believes that her reflux is under excellent control at this point time on her current therapy.  Allergies as of 05/11/2017      Reactions   Codeine Nausea And Vomiting   Sulfa Antibiotics Other (See Comments)   Allergic per pt's mother       Medication List      albuterol 108 (90 Base) MCG/ACT inhaler Commonly known as:  VENTOLIN HFA Inhale two puffs every 4-6 hours if needed for cough or wheeze   aspirin EC 81 MG tablet Take 81 mg by mouth daily.   atorvastatin 20 MG tablet Commonly known as:  LIPITOR Take 20 mg by mouth daily.   beclomethasone 80 MCG/ACT inhaler Commonly known as:  QVAR REDIHALER Inhale two doses twice daily during flare-up.  Rinse, gargle, and spit after use.   citalopram 20 MG tablet Commonly known as:  CELEXA Take 20 mg by mouth daily.   dicyclomine 20 MG tablet Commonly known as:  BENTYL Take 20 mg by mouth 2 (two) times daily.   DULERA 200-5 MCG/ACT Aero Generic drug:  mometasone-formoterol Inhale 2 puffs into the  lungs 2 (two) times daily.   fluticasone 50 MCG/ACT nasal spray Commonly known as:  FLONASE USE ONE TO TWO SPRAY(S) IN EACH NOSTRIL ONCE DAILY FOR STUFFY NOSE OR DRAINAGE   Ibuprofen 200 MG Caps Take by mouth as needed.   ipratropium-albuterol 0.5-2.5 (3) MG/3ML Soln Commonly known as:  DUONEB Take 3 mLs by nebulization every 4 (four) hours as needed.   losartan 100 MG tablet Commonly known as:  COZAAR Take 100 mg by mouth daily.   omalizumab 150 MG injection Commonly known as:  XOLAIR Inject into the skin.   omeprazole 40 MG capsule Commonly known as:  PRILOSEC TAKE ONE CAPSULE BY MOUTH TWICE DAILY   pioglitazone 30 MG tablet Commonly known as:  ACTOS Take 30 mg by mouth daily.   PROBIOTIC DAILY PO Take 1 capsule by mouth daily.   QVAR 40 MCG/ACT inhaler Generic drug:  beclomethasone Inhale into the lungs.   spironolactone-hydrochlorothiazide 25-25 MG tablet Commonly known as:  ALDACTAZIDE Take 1 tablet by mouth daily.   VICTOZA 18 MG/3ML Sopn Generic drug:  liraglutide Inject into the skin.   VITAMIN D PO Take by mouth daily.       Past Medical History:  Diagnosis Date  . Allergy   . Anxiety   . Cataract    bilateral and removed  . Depression   . Diaphragmatic hernia without mention of obstruction or gangrene   .  DM (diabetes mellitus) ([place])   . Esophageal reflux   . Esophagitis, unspecified   . H/O adenomatous polyp of colon 2011  . Hyperlipidemia   . Incontinence of feces   . Irritable bowel syndrome   . Kidney stones   . Sarcoidosis   . Sleep apnea    no cpap now  . Status post dilation of esophageal narrowing   . Unspecified asthma(493.90)   . Unspecified essential hypertension   . Unspecified hearing loss     Past Surgical History:  Procedure Laterality Date  . BLADDER SURGERY    . CATARACT EXTRACTION Bilateral   . HARDWARE REMOVAL Left 01/12/2017   Procedure: HARDWARE REMOVAL left wrist;  Surgeon: Leanora Cover, MD;  Location: [facility];  Service: Orthopedics;  Laterality: Left;  . HEMORRHOID SURGERY    . NASAL SEPTUM SURGERY    . PARTIAL HYSTERECTOMY    . TONSILLECTOMY AND ADENOIDECTOMY    . TRIGGER FINGER RELEASE Left 05/12/2016   Procedure: RELEASE TRIGGER FINGER/A-1 PULLEY INFECTION LEFT RING TENDON SHEATH INJECT RIGHT INDEX FINGER;  Surgeon: Leanora Cover, MD;  Location: [facility];  Service: Orthopedics;  Laterality: Left;  . TUBAL LIGATION      Review of systems negative except as noted in HPI / PMHx or noted below:  Review of Systems  Constitutional: Negative.   HENT: Negative.   Eyes: Negative.   Respiratory: Negative.   Cardiovascular: Negative.   Gastrointestinal: Negative.   Genitourinary: Negative.   Musculoskeletal: Negative.   Skin: Negative.   Neurological: Negative.   Endo/Heme/Allergies: Negative.   Psychiatric/Behavioral: Negative.      Objective:   Vitals:   05/11/17 0858  BP: 118/64  Pulse: 80  Resp: 20          Physical Exam  Constitutional: She is well-developed, well-nourished, and in no distress.  HENT:  Head: Normocephalic.  Right Ear: Tympanic membrane, external ear and ear canal normal.  Left Ear: Tympanic membrane, external ear and ear canal normal.  Nose: Nose normal. No mucosal edema or rhinorrhea.  Mouth/Throat: Uvula is midline, oropharynx is clear and moist and mucous membranes are normal. No oropharyngeal exudate.  Eyes: Conjunctivae are normal.  Neck: Trachea normal. No tracheal tenderness present. No tracheal deviation present. No thyromegaly present.  Cardiovascular: Normal rate, regular rhythm, S1 normal, S2 normal and normal heart sounds.   No murmur heard. Pulmonary/Chest: Breath sounds normal. No stridor. No respiratory distress. She has no wheezes. She has no rales.  Musculoskeletal: She exhibits no edema.  Lymphadenopathy:       Head (right side): No tonsillar adenopathy present.       Head (left side): No tonsillar  adenopathy present.    She has no cervical adenopathy.  Neurological: She is alert. Gait normal.  Skin: No rash noted. She is not diaphoretic. No erythema. Nails show no clubbing.  Psychiatric: Mood and affect normal.    Diagnostics:    Spirometry was performed and demonstrated an FEV1 of 1.06 at 51 % of predicted.  The patient had an Asthma Control Test with the following results: ACT Total Score: 12.    Assessment and Plan:   1. Not well controlled severe persistent asthma   2. Other allergic rhinitis   3. LPRD (laryngopharyngeal reflux disease)     1. Continue Xolair and Epi-Pen  2. Continue Dulera 200 two inhalations two times per day with spacer  3. Add SAMPLES of Qvar 80 REDIHALER two inhalations two times  per day to [facility] during "flare up".  4. Continue Flonase - one spray each nostril 1-2 times per day  5. Continue omeprazole 40 twice a day + ranitidine 300 in evening  6. Continue nasal saline, ProAir HFA, Duoneb if needed.  7. Can add OTC Loratadine / Claritin 10 mg one tablet 1-2 times per day if needed  8. Can add OTC Mucinex DM 1-2 tablet 1-2 times per day if needed  9. Return in 6 months or earlier if problem.  Max apparently had some type of respiratory tract flare most likely precipitated by a viral respiratory tract infection that appears to be slowly improving. I did encourage her to add in additional ICS whenever she does develop a flareup of her respiratory tract disease in the future. I given her some samples of Qvar 80 to use for this plan. She will continue to aggressively treat reflux as noted above as well as using her anti-inflammatory agents on a consistent basis as well as continue on omalizumab injections and I will see her back in this clinic in 6 months or earlier if there is a problem.  Allena Katz, MD Allergy / Immunology [place]

## 2017-06-01 ENCOUNTER — Telehealth: Payer: Self-pay

## 2017-06-01 NOTE — Telephone Encounter (Signed)
Left message for patient to call the office.  Please inform her that we have a sample of the Auvi-Q for her ( epipen too expensive).  Please show her how to use Auvi-Q when she comes to pick it up.  I will place sample at front desk for patient to pick up.

## 2017-06-05 DIAGNOSIS — J4540 Moderate persistent asthma, uncomplicated: Secondary | ICD-10-CM | POA: Diagnosis not present

## 2017-06-05 NOTE — Telephone Encounter (Signed)
Patient will come by office to pick up sample.

## 2017-06-05 NOTE — Telephone Encounter (Signed)
[name] (Surveyor, quantity) informed patient.

## 2017-06-06 DIAGNOSIS — J4550 Severe persistent asthma, uncomplicated: Secondary | ICD-10-CM | POA: Diagnosis not present

## 2017-06-07 ENCOUNTER — Ambulatory Visit (INDEPENDENT_AMBULATORY_CARE_PROVIDER_SITE_OTHER): Payer: Medicare Other

## 2017-06-07 DIAGNOSIS — J4550 Severe persistent asthma, uncomplicated: Secondary | ICD-10-CM | POA: Diagnosis not present

## 2017-06-22 DIAGNOSIS — R270 Ataxia, unspecified: Secondary | ICD-10-CM | POA: Diagnosis not present

## 2017-06-22 DIAGNOSIS — M159 Polyosteoarthritis, unspecified: Secondary | ICD-10-CM | POA: Diagnosis not present

## 2017-06-22 DIAGNOSIS — R2990 Unspecified symptoms and signs involving the nervous system: Secondary | ICD-10-CM | POA: Diagnosis not present

## 2017-06-22 DIAGNOSIS — Z6831 Body mass index (BMI) 31.0-31.9, adult: Secondary | ICD-10-CM | POA: Diagnosis not present

## 2017-06-26 ENCOUNTER — Ambulatory Visit: Payer: Medicare Other | Admitting: Allergy and Immunology

## 2017-06-27 DIAGNOSIS — R270 Ataxia, unspecified: Secondary | ICD-10-CM | POA: Diagnosis not present

## 2017-06-27 DIAGNOSIS — R2681 Unsteadiness on feet: Secondary | ICD-10-CM | POA: Diagnosis not present

## 2017-07-04 DIAGNOSIS — J4550 Severe persistent asthma, uncomplicated: Secondary | ICD-10-CM | POA: Diagnosis not present

## 2017-07-05 ENCOUNTER — Ambulatory Visit (INDEPENDENT_AMBULATORY_CARE_PROVIDER_SITE_OTHER): Payer: Medicare Other | Admitting: *Deleted

## 2017-07-05 DIAGNOSIS — J4550 Severe persistent asthma, uncomplicated: Secondary | ICD-10-CM | POA: Diagnosis not present

## 2017-07-17 ENCOUNTER — Other Ambulatory Visit: Payer: Self-pay | Admitting: Allergy and Immunology

## 2017-07-17 DIAGNOSIS — H353132 Nonexudative age-related macular degeneration, bilateral, intermediate dry stage: Secondary | ICD-10-CM | POA: Diagnosis not present

## 2017-07-17 DIAGNOSIS — E119 Type 2 diabetes mellitus without complications: Secondary | ICD-10-CM | POA: Diagnosis not present

## 2017-07-17 DIAGNOSIS — H43813 Vitreous degeneration, bilateral: Secondary | ICD-10-CM | POA: Diagnosis not present

## 2017-07-17 DIAGNOSIS — H52203 Unspecified astigmatism, bilateral: Secondary | ICD-10-CM | POA: Diagnosis not present

## 2017-07-18 NOTE — Progress Notes (Signed)
Office Visit Note  Patient: [name]             Date of Birth: Apr 10, 1946           MRN: [id number]             PCP: Myer Peer, MD Referring: Myer Peer, MD Visit Date: 07/24/2017 Occupation: @GUAROCC @    Subjective:  Pain in multiple joints.   History of Present Illness: [name] is a 71 y.o. female  with history of osteoarthritis involving multiple joints. She states her left shoulder joint has been very painful lately she has nocturnal pain and has difficulty sleeping on her left side. She was seen by Dr. Durward Fortes recently who recommended total shoulder replacement. She continues to have discomfort in her bilateral hands bilateral knees and her feet. She states her ankle joint especially her left ankle has been hurting as well. She was seen by her PCP recently who started her on Lodine and tramadol. She has had frequent falls. She states she had MRI of her brain which was normal. Her physician feels that her falls are related to hearing loss per patient. Patient requests injection to her left knee joint which is been causing some discomfort.  Activities of Daily Living:  Patient reports morning stiffness for 1 hour.   Patient Reports nocturnal pain.  Difficulty dressing/grooming: Denies Difficulty climbing stairs: Reports Difficulty getting out of chair: Reports Difficulty using hands for taps, buttons, cutlery, and/or writing: Denies   Review of Systems  Constitutional: Positive for fatigue. Negative for night sweats, weight gain, weight loss and weakness.  HENT: Positive for hearing loss. Negative for mouth sores, trouble swallowing, trouble swallowing, mouth dryness and nose dryness.   Eyes: Negative for pain, redness, visual disturbance and dryness.  Respiratory: Positive for shortness of breath. Negative for cough and difficulty breathing.        H/o asthma  Cardiovascular: Positive for hypertension. Negative for chest pain, palpitations,  irregular heartbeat and swelling in legs/feet.  Gastrointestinal: Negative for blood in stool, constipation and diarrhea.  Endocrine: Negative for increased urination.  Genitourinary: Negative for vaginal dryness.  Musculoskeletal: Positive for arthralgias, joint pain, myalgias, morning stiffness and myalgias. Negative for joint swelling, muscle weakness and muscle tenderness.  Skin: Negative for color change, rash, hair loss, skin tightness, ulcers and sensitivity to sunlight.  Allergic/Immunologic: Negative for susceptible to infections.  Neurological: Negative for dizziness, memory loss and night sweats.  Hematological: Negative for swollen glands.  Psychiatric/Behavioral: Positive for sleep disturbance. Negative for depressed mood. The patient is not nervous/anxious.     PMFS History:  Patient Active Problem List   Diagnosis Date Noted  . DDD (degenerative disc disease), lumbar 01/23/2017  . History of diabetes mellitus 01/23/2017  . History of depression 01/23/2017  . History of asthma 01/23/2017  . Dyslipidemia 01/23/2017  . Age-related osteoporosis without current pathological fracture 01/23/2017  . History of gastroesophageal reflux (GERD) 01/17/2017  . History of hypertension 01/17/2017  . Primary osteoarthritis of both knees 01/17/2017  . Primary osteoarthritis of both feet 01/17/2017  . Vitamin D deficiency 01/17/2017  . Rheumatoid factor positive 12/13/2016  . Primary osteoarthritis of both hands 12/13/2016  . Obstructive lung disease ([place]) 07/29/2015  . H/O allergic rhinitis 07/29/2015  . LPRD (laryngopharyngeal reflux disease) 07/29/2015  . Chest pain 11/01/2013  . Murmur 11/01/2013  . DM 03/03/2010  . ABDOMINAL PAIN RIGHT LOWER QUADRANT 03/03/2010  . FULL INCONTINENCE OF FECES 03/03/2010  .  DECREASED HEARING 05/02/2008  . HYPERTENSION 05/02/2008  . ESOPHAGITIS 05/02/2008  . HIATAL HERNIA 05/02/2008  . RECTAL INCONTINENCE 05/02/2008  . SARCOIDOSIS 11/16/2007  .  Severe persistent asthma 11/16/2007  . GERD 11/16/2007  . IRRITABLE BOWEL SYNDROME 11/16/2007    Past Medical History:  Diagnosis Date  . Allergy   . Anxiety   . Cataract    bilateral and removed  . Depression   . Diaphragmatic hernia without mention of obstruction or gangrene   . DM (diabetes mellitus) ([place])   . Esophageal reflux   . Esophagitis, unspecified   . H/O adenomatous polyp of colon 2011  . Hyperlipidemia   . Incontinence of feces   . Irritable bowel syndrome   . Kidney stones   . Sarcoidosis   . Sleep apnea    no cpap now  . Status post dilation of esophageal narrowing   . Unspecified asthma(493.90)   . Unspecified essential hypertension   . Unspecified hearing loss     Family History  Problem Relation Age of Onset  . Esophageal cancer Father   . Diabetes Mother   . Diabetes Maternal Grandmother   . Colon cancer Neg Hx   . Rectal cancer Neg Hx   . Stomach cancer Neg Hx    Past Surgical History:  Procedure Laterality Date  . BLADDER SURGERY    . CATARACT EXTRACTION Bilateral   . HARDWARE REMOVAL Left 01/12/2017   Procedure: HARDWARE REMOVAL left wrist;  Surgeon: Leanora Cover, MD;  Location: [facility];  Service: Orthopedics;  Laterality: Left;  . HEMORRHOID SURGERY    . NASAL SEPTUM SURGERY    . PARTIAL HYSTERECTOMY    . TONSILLECTOMY AND ADENOIDECTOMY    . TRIGGER FINGER RELEASE Left 05/12/2016   Procedure: RELEASE TRIGGER FINGER/A-1 PULLEY INFECTION LEFT RING TENDON SHEATH INJECT RIGHT INDEX FINGER;  Surgeon: Leanora Cover, MD;  Location: [facility];  Service: Orthopedics;  Laterality: Left;  . TUBAL LIGATION     Social History   Social History Narrative   Daily caffeine use.      Objective: Vital Signs: BP (!) 148/70   Pulse 80   Resp 14   Ht 5' (1.524 m)   Wt 172 lb (78 kg)   BMI 33.59 kg/m    Physical Exam  Constitutional: She is oriented to person, place, and time. She appears well-developed and  well-nourished.  HENT:  Head: Normocephalic and atraumatic.  Eyes: Conjunctivae and EOM are normal.  Neck: Normal range of motion.  Cardiovascular: Normal rate, regular rhythm, normal heart sounds and intact distal pulses.   Pulmonary/Chest: Effort normal and breath sounds normal.  Abdominal: Soft. Bowel sounds are normal.  Lymphadenopathy:    She has no cervical adenopathy.  Neurological: She is alert and oriented to person, place, and time.  Skin: Skin is warm and dry. Capillary refill takes less than 2 seconds.  Psychiatric: She has a normal mood and affect. Her behavior is normal.  Nursing note and vitals reviewed.    Musculoskeletal Exam: C-spine and thoracic spine good range of motion. Shoulder joints are good range of motion. She has some DIP PIP thickening in her hands. Hip joints are good range of motion. She has some discomfort with range of motion of her bilateral knee joints with limited extension. She also describes some tenderness over her ankle joints and feet without any warmth swelling or effusion.  CDAI Exam: No CDAI exam completed.    Investigation: No additional findings.  Xr Knee 3 View Left  Result Date: 07/24/2017 Severe medial compartment narrowing no chondrocalcinosis was noted. Severe patellofemoral narrowing was noted. Impression: Severe osteoarthritis and chondromalacia patella.  Xr Knee 3 View Right  Result Date: 07/24/2017 Moderate to severe medial compartment narrowing and patellofemoral narrowing Impression: Moderate to severe osteoarthritis of the knee joint with chondromalacia patella  Xr Shoulder Left  Result Date: 07/20/2017 Films of the left shoulder obtained in several projections. There is obvious osteoarthritis with inferior humeral head spurring and subchondral sclerosis of the humeral head. There is diffuse in the joint between the humeral head and the glenoid. No ectopic calcification. Humeral head is centered about the glenoid. No in  decreased space between the humeral head and acromion. Some degenerative changes at the acromioclavicular joint. Diffuse bony demineralization is apparent. All of the above consistent with moderate osteoarthritis left shoulder   Speciality Comments: No specialty comments available.    Procedures:  Large Joint Inj Date/Time: 07/24/2017 11:13 AM Performed by: Bo Merino Authorized by: Bo Merino   Consent Given by:  Patient Site marked: the procedure site was marked   Timeout: prior to procedure the correct patient, procedure, and site was verified   Indications:  Pain and joint swelling Location:  Knee Site:  L knee Prep: patient was prepped and draped in usual sterile fashion   Needle Size:  27 G Needle Length:  1.5 inches Approach:  Medial Ultrasound Guidance: No   Fluoroscopic Guidance: No   Arthrogram: No   Medications:  1.5 mL lidocaine 1 %; 40 mg triamcinolone acetonide 40 MG/ML Aspiration Attempted: Yes   Aspirate amount (mL):  0 Patient tolerance:  Patient tolerated the procedure well with no immediate complications   Allergies: Codeine and Sulfa antibiotics   Assessment / Plan:     Visit Diagnoses: Chronic left shoulder pain: She was recently seen by Dr. Durward Fortes and was suggested total shoulder replacement per patient. She is not mentally prepared for it currently.  Primary osteoarthritis of both hands: Joint protection and muscle strengthening discussed.  Primary osteoarthritis of both knees  Chronic pain of both knees - Plan: XR KNEE 3 VIEW RIGHT, XR KNEE 3 VIEW LEFT  Primary osteoarthritis of both feet: She's been having some discomfort in her feet also.  DDD (degenerative disc disease), lumbar: Chronic pain  Rheumatoid factor positive: She has no synovitis on examination  Age-related osteoporosis without current pathological fracture: She is uncertain where she gets her bone densities.  Her other medical problems are listed as  follows.  Vitamin D deficiency  History of asthma  History of depression  History of diabetes mellitus: I have also advised her to monitor her blood sugar after cortisone injection.  History of hypertension: Her blood pressure is slightly elevated today. I've advised her to monitor blood pressure closely.  History of gastroesophageal reflux (GERD)    Orders: Orders Placed This Encounter  Procedures  . Large Joint Injection/Arthrocentesis  . XR KNEE 3 VIEW RIGHT  . XR KNEE 3 VIEW LEFT   No orders of the defined types were placed in this encounter.   Face-to-face time spent with patient was 30 minutes. Greater than 50 50% of time was spent in counseling and coordination of care.  Follow-Up Instructions: Return in about 6 months (around 01/24/2018) for Osteoarthritis.   Bo Merino, MD  Note - This record has been created using Editor, commissioning.  Chart creation errors have been sought, but may not always  have been located. Such creation errors  do not reflect on  the standard of medical care.

## 2017-07-20 ENCOUNTER — Ambulatory Visit (INDEPENDENT_AMBULATORY_CARE_PROVIDER_SITE_OTHER): Payer: Medicare Other | Admitting: Orthopaedic Surgery

## 2017-07-20 ENCOUNTER — Encounter (INDEPENDENT_AMBULATORY_CARE_PROVIDER_SITE_OTHER): Payer: Self-pay | Admitting: Orthopaedic Surgery

## 2017-07-20 ENCOUNTER — Ambulatory Visit (INDEPENDENT_AMBULATORY_CARE_PROVIDER_SITE_OTHER): Payer: Medicare Other

## 2017-07-20 VITALS — BP 134/51 | HR 68 | Resp 14 | Ht 60.0 in | Wt 145.0 lb

## 2017-07-20 DIAGNOSIS — M19012 Primary osteoarthritis, left shoulder: Secondary | ICD-10-CM

## 2017-07-20 NOTE — Progress Notes (Signed)
Office Visit Note   Patient: [name]           Date of Birth: 1946-05-12           MRN: [id number] Visit Date: 07/20/2017              Requested by: Myer Peer, MD [place], [place] [phone] PCP: Myer Peer, MD   Assessment & Plan: Visit Diagnoses:  1. Primary osteoarthritis, left shoulder     Plan: Long discussion regarding different treatment options. She is actually feeling better than when she made the appointment. Her family physician and gave her prescription for tramadol and etolodac and it seems to be making a difference. She is feeling a bit better than she was late last week. We'll consider further cortisone injections over time, physical therapy, exercises and again inserted her shoulder replacement. At the moment she's happy  Follow-Up Instructions: No Follow-up on file.   Orders:  Orders Placed This Encounter  Procedures  . XR Shoulder Left   No orders of the defined types were placed in this encounter.     Procedures: No procedures performed   Clinical Data: No additional findings.   Subjective: Chief Complaint  Patient presents with  . Left Shoulder - Pain, Numbness    [name] is a 71 y o that presents with chronic Left shoulder pain. Limited ROM  [name] has a history of osteoarthritis of the left shoulder. She is responded to cortisone in the past. She recently had an exacerbation of the point where she was really uncomfortable. Her primary care physician placed her on tramadol and an anti-inflammatory medicine. She is better. She does have limitation of motion of her shoulder. Fortunately she is left hand nondominant. She's not had any numbness or tingling or neck pain. She denies shortness of breath chest pain fever or chills.  HPI  Review of Systems  Constitutional: Negative for chills, fatigue and fever.  Eyes: Positive for itching.  Respiratory: Negative for chest tightness and shortness of breath.    Cardiovascular: Positive for leg swelling. Negative for chest pain and palpitations.  Gastrointestinal: Negative for blood in stool, constipation and diarrhea.  Musculoskeletal: Negative for back pain, joint swelling and neck stiffness.  Neurological: Positive for weakness. Negative for dizziness, numbness and headaches.  Hematological: Bruises/bleeds easily.  Psychiatric/Behavioral: Negative for sleep disturbance. The patient is not nervous/anxious.      Objective: Vital Signs: BP (!) 134/51   Pulse 68   Resp 14   Ht 5' (1.524 m)   Wt 145 lb (65.8 kg)   BMI 28.32 kg/m   Physical Exam  Ortho Exam left shoulder with 90 of abduction and about 110 of passive flexion. Again intact. Proximally 20-25 of external rotation and 80 of internal rotation. She cannot touch the lower part of her back. Skin intact. Neurovascular exam intact. Very minimal crepitation with shoulder motion. Good strength. Denies any pain with range of motion of the cervical spine.  Specialty Comments:  No specialty comments available.  Imaging: Xr Shoulder Left  Result Date: 07/20/2017 Films of the left shoulder obtained in several projections. There is obvious osteoarthritis with inferior humeral head spurring and subchondral sclerosis of the humeral head. There is diffuse in the joint between the humeral head and the glenoid. No ectopic calcification. Humeral head is centered about the glenoid. No in decreased space between the humeral head and acromion. Some degenerative changes at the acromioclavicular joint. Diffuse bony  demineralization is apparent. All of the above consistent with moderate osteoarthritis left shoulder    PMFS History: Patient Active Problem List   Diagnosis Date Noted  . DDD (degenerative disc disease), lumbar 01/23/2017  . History of diabetes mellitus 01/23/2017  . History of depression 01/23/2017  . History of asthma 01/23/2017  . Dyslipidemia 01/23/2017  . Age-related  osteoporosis without current pathological fracture 01/23/2017  . History of gastroesophageal reflux (GERD) 01/17/2017  . History of hypertension 01/17/2017  . Primary osteoarthritis of both knees 01/17/2017  . Primary osteoarthritis of both feet 01/17/2017  . Vitamin D deficiency 01/17/2017  . Rheumatoid factor positive 12/13/2016  . Primary osteoarthritis of both hands 12/13/2016  . Obstructive lung disease ([place]) 07/29/2015  . H/O allergic rhinitis 07/29/2015  . LPRD (laryngopharyngeal reflux disease) 07/29/2015  . Chest pain 11/01/2013  . Murmur 11/01/2013  . DM 03/03/2010  . ABDOMINAL PAIN RIGHT LOWER QUADRANT 03/03/2010  . FULL INCONTINENCE OF FECES 03/03/2010  . DECREASED HEARING 05/02/2008  . HYPERTENSION 05/02/2008  . ESOPHAGITIS 05/02/2008  . HIATAL HERNIA 05/02/2008  . RECTAL INCONTINENCE 05/02/2008  . SARCOIDOSIS 11/16/2007  . Severe persistent asthma 11/16/2007  . GERD 11/16/2007  . IRRITABLE BOWEL SYNDROME 11/16/2007   Past Medical History:  Diagnosis Date  . Allergy   . Anxiety   . Cataract    bilateral and removed  . Depression   . Diaphragmatic hernia without mention of obstruction or gangrene   . DM (diabetes mellitus) ([place])   . Esophageal reflux   . Esophagitis, unspecified   . H/O adenomatous polyp of colon 2011  . Hyperlipidemia   . Incontinence of feces   . Irritable bowel syndrome   . Kidney stones   . Sarcoidosis   . Sleep apnea    no cpap now  . Status post dilation of esophageal narrowing   . Unspecified asthma(493.90)   . Unspecified essential hypertension   . Unspecified hearing loss     Family History  Problem Relation Age of Onset  . Esophageal cancer Father   . Diabetes Mother   . Diabetes Maternal Grandmother   . Colon cancer Neg Hx   . Rectal cancer Neg Hx   . Stomach cancer Neg Hx     Past Surgical History:  Procedure Laterality Date  . BLADDER SURGERY    . CATARACT EXTRACTION Bilateral   . HARDWARE REMOVAL Left 01/12/2017     Procedure: HARDWARE REMOVAL left wrist;  Surgeon: Leanora Cover, MD;  Location: [facility];  Service: Orthopedics;  Laterality: Left;  . HEMORRHOID SURGERY    . NASAL SEPTUM SURGERY    . PARTIAL HYSTERECTOMY    . TONSILLECTOMY AND ADENOIDECTOMY    . TRIGGER FINGER RELEASE Left 05/12/2016   Procedure: RELEASE TRIGGER FINGER/A-1 PULLEY INFECTION LEFT RING TENDON SHEATH INJECT RIGHT INDEX FINGER;  Surgeon: Leanora Cover, MD;  Location: [facility];  Service: Orthopedics;  Laterality: Left;  . TUBAL LIGATION     Social History   Occupational History  . retired    Social History Main Topics  . Smoking status: Never Smoker  . Smokeless tobacco: Never Used  . Alcohol use No  . Drug use: No  . Sexual activity: Not on file

## 2017-07-24 ENCOUNTER — Ambulatory Visit (INDEPENDENT_AMBULATORY_CARE_PROVIDER_SITE_OTHER): Payer: Medicare Other

## 2017-07-24 ENCOUNTER — Ambulatory Visit (INDEPENDENT_AMBULATORY_CARE_PROVIDER_SITE_OTHER): Payer: Self-pay

## 2017-07-24 ENCOUNTER — Encounter: Payer: Self-pay | Admitting: Rheumatology

## 2017-07-24 ENCOUNTER — Ambulatory Visit (INDEPENDENT_AMBULATORY_CARE_PROVIDER_SITE_OTHER): Payer: Medicare Other | Admitting: Rheumatology

## 2017-07-24 VITALS — BP 148/70 | HR 80 | Resp 14 | Ht 60.0 in | Wt 172.0 lb

## 2017-07-24 DIAGNOSIS — Z8719 Personal history of other diseases of the digestive system: Secondary | ICD-10-CM

## 2017-07-24 DIAGNOSIS — G8929 Other chronic pain: Secondary | ICD-10-CM

## 2017-07-24 DIAGNOSIS — M25512 Pain in left shoulder: Secondary | ICD-10-CM

## 2017-07-24 DIAGNOSIS — Z8679 Personal history of other diseases of the circulatory system: Secondary | ICD-10-CM

## 2017-07-24 DIAGNOSIS — E559 Vitamin D deficiency, unspecified: Secondary | ICD-10-CM

## 2017-07-24 DIAGNOSIS — M25562 Pain in left knee: Secondary | ICD-10-CM

## 2017-07-24 DIAGNOSIS — M19041 Primary osteoarthritis, right hand: Secondary | ICD-10-CM

## 2017-07-24 DIAGNOSIS — Z8639 Personal history of other endocrine, nutritional and metabolic disease: Secondary | ICD-10-CM

## 2017-07-24 DIAGNOSIS — M19042 Primary osteoarthritis, left hand: Secondary | ICD-10-CM

## 2017-07-24 DIAGNOSIS — M25561 Pain in right knee: Secondary | ICD-10-CM | POA: Diagnosis not present

## 2017-07-24 DIAGNOSIS — M19071 Primary osteoarthritis, right ankle and foot: Secondary | ICD-10-CM | POA: Diagnosis not present

## 2017-07-24 DIAGNOSIS — M810 Age-related osteoporosis without current pathological fracture: Secondary | ICD-10-CM

## 2017-07-24 DIAGNOSIS — Z8659 Personal history of other mental and behavioral disorders: Secondary | ICD-10-CM

## 2017-07-24 DIAGNOSIS — M170 Bilateral primary osteoarthritis of knee: Secondary | ICD-10-CM

## 2017-07-24 DIAGNOSIS — Z8709 Personal history of other diseases of the respiratory system: Secondary | ICD-10-CM

## 2017-07-24 DIAGNOSIS — M5136 Other intervertebral disc degeneration, lumbar region: Secondary | ICD-10-CM

## 2017-07-24 DIAGNOSIS — M19072 Primary osteoarthritis, left ankle and foot: Secondary | ICD-10-CM

## 2017-07-24 DIAGNOSIS — R768 Other specified abnormal immunological findings in serum: Secondary | ICD-10-CM

## 2017-07-24 MED ORDER — LIDOCAINE HCL 1 % IJ SOLN
1.5000 mL | INTRAMUSCULAR | Status: AC | PRN
  Administered 2017-07-24: 1.5 mL

## 2017-07-24 MED ORDER — TRIAMCINOLONE ACETONIDE 40 MG/ML IJ SUSP
40.0000 mg | INTRAMUSCULAR | Status: AC | PRN
  Administered 2017-07-24: 40 mg via INTRA_ARTICULAR

## 2017-08-01 DIAGNOSIS — J4550 Severe persistent asthma, uncomplicated: Secondary | ICD-10-CM

## 2017-08-02 ENCOUNTER — Ambulatory Visit (INDEPENDENT_AMBULATORY_CARE_PROVIDER_SITE_OTHER): Payer: Medicare Other | Admitting: *Deleted

## 2017-08-02 DIAGNOSIS — J4550 Severe persistent asthma, uncomplicated: Secondary | ICD-10-CM

## 2017-08-09 DIAGNOSIS — E119 Type 2 diabetes mellitus without complications: Secondary | ICD-10-CM | POA: Diagnosis not present

## 2017-08-14 ENCOUNTER — Ambulatory Visit (INDEPENDENT_AMBULATORY_CARE_PROVIDER_SITE_OTHER): Payer: Medicare Other | Admitting: Orthopaedic Surgery

## 2017-08-17 ENCOUNTER — Telehealth (INDEPENDENT_AMBULATORY_CARE_PROVIDER_SITE_OTHER): Payer: Self-pay | Admitting: Rheumatology

## 2017-08-17 NOTE — Telephone Encounter (Signed)
LAST 2 OV NOTES FAXED TO [facility] [phone]

## 2017-08-23 DIAGNOSIS — R351 Nocturia: Secondary | ICD-10-CM | POA: Diagnosis not present

## 2017-08-23 DIAGNOSIS — Z6831 Body mass index (BMI) 31.0-31.9, adult: Secondary | ICD-10-CM | POA: Diagnosis not present

## 2017-08-23 DIAGNOSIS — E119 Type 2 diabetes mellitus without complications: Secondary | ICD-10-CM | POA: Diagnosis not present

## 2017-08-23 DIAGNOSIS — I129 Hypertensive chronic kidney disease with stage 1 through stage 4 chronic kidney disease, or unspecified chronic kidney disease: Secondary | ICD-10-CM | POA: Diagnosis not present

## 2017-08-29 DIAGNOSIS — J4540 Moderate persistent asthma, uncomplicated: Secondary | ICD-10-CM | POA: Diagnosis not present

## 2017-08-30 ENCOUNTER — Ambulatory Visit (INDEPENDENT_AMBULATORY_CARE_PROVIDER_SITE_OTHER): Payer: Medicare Other | Admitting: *Deleted

## 2017-08-30 DIAGNOSIS — J4540 Moderate persistent asthma, uncomplicated: Secondary | ICD-10-CM | POA: Diagnosis not present

## 2017-09-13 ENCOUNTER — Ambulatory Visit (INDEPENDENT_AMBULATORY_CARE_PROVIDER_SITE_OTHER): Payer: Medicare Other | Admitting: Allergy and Immunology

## 2017-09-13 ENCOUNTER — Encounter: Payer: Self-pay | Admitting: Allergy and Immunology

## 2017-09-13 VITALS — BP 152/68 | HR 64 | Resp 20

## 2017-09-13 DIAGNOSIS — K219 Gastro-esophageal reflux disease without esophagitis: Secondary | ICD-10-CM

## 2017-09-13 DIAGNOSIS — J3089 Other allergic rhinitis: Secondary | ICD-10-CM

## 2017-09-13 DIAGNOSIS — J4551 Severe persistent asthma with (acute) exacerbation: Secondary | ICD-10-CM | POA: Diagnosis not present

## 2017-09-13 MED ORDER — IPRATROPIUM BROMIDE 0.06 % NA SOLN: NASAL | 5 refills | Status: DC

## 2017-09-13 MED ORDER — METHYLPREDNISOLONE ACETATE 80 MG/ML IJ SUSP
40.0000 mg | Freq: Once | INTRAMUSCULAR | Status: AC
  Administered 2017-09-13: 40 mg via INTRAMUSCULAR

## 2017-09-13 NOTE — Patient Instructions (Addendum)
  1. Continue Xolair and Epi-Pen  2. Restart Dulera 200 two inhalations two times per day with spacer  3. Add SAMPLES of Qvar 80 REDIHALER two inhalations two times per day to [facility] during "flare up".  4. Continue Flonase - one spray each nostril 1-2 times per day  5. Continue omeprazole 40 twice a day + ranitidine 300 in evening  6. Continue nasal saline, ProAir HFA, Duoneb if needed.  7. Can add OTC Loratadine / Claritin 10 mg one tablet 1-2 times per day if needed  8. Can add OTC Mucinex DM 1-2 tablet 1-2 times per day if needed  9. Can add ipratropium 0.06% nasal spray - one-2 sprays each nostril every 6 hours as needed to dry up nose  10. Depo-Medrol 40 IM delivered in clinic today  11. Return in 6 months or earlier if problem.

## 2017-09-13 NOTE — Progress Notes (Signed)
Follow-up Note  Referring Provider: Myer Peer, MD Primary Provider: Myer Peer, MD Date of Office Visit: 09/13/2017  Subjective:   [name] (DOB: [birth year]) is a 71 y.o. female who returns to the [facility] [place] on 09/13/2017 in re-evaluation of the following:  HPI: [name] returns to this clinic in reevaluation of her COPD with severe asthma and allergic rhinitis and LPR. Her last visit to this clinic was 05/11/2017.  She was doing wonderful until she ran out of [place]. Prior to that point in time she rarely used a short acting bronchodilator and she could exert herself without much problem and did not require systemic steroid to treat an exacerbation of asthma. Since she has been out of the [facility] for the past week she has developed wheezing this past Monday and has had to use her bronchodilator daily since. As well, she thinks that some of her breathing problem is precipitated by "stress". Her son's wife has left the household over the course of the past 2 weeks.  She also notes that she has constant runny nose that does not respond to any type of antihistamine. Otherwise, her nose is doing relatively well. She has not required an antibiotic to treat an episode of sinusitis.  Her reflux is under good control at this point in time.  Allergies as of 09/13/2017      Reactions   Codeine Nausea And Vomiting   Sulfa Antibiotics Other (See Comments)   Allergic per pt's mother       Medication List      albuterol 108 (90 Base) MCG/ACT inhaler Commonly known as:  VENTOLIN HFA Inhale two puffs every 4-6 hours if needed for cough or wheeze   aspirin EC 81 MG tablet Take 81 mg by mouth daily.   atorvastatin 20 MG tablet Commonly known as:  LIPITOR Take 20 mg by mouth daily.   beclomethasone 80 MCG/ACT inhaler Commonly known as:  QVAR REDIHALER Inhale two doses twice daily during flare-up.  Rinse, gargle, and spit after use.   citalopram 20 MG  tablet Commonly known as:  CELEXA Take 20 mg by mouth daily.   dicyclomine 20 MG tablet Commonly known as:  BENTYL Take 20 mg by mouth 2 (two) times daily.   DULERA 200-5 MCG/ACT Aero Generic drug:  mometasone-formoterol Inhale 2 puffs into the lungs 2 (two) times daily.   etodolac 500 MG tablet Commonly known as:  LODINE Take 500 mg by mouth 2 (two) times daily.   fluticasone 50 MCG/ACT nasal spray Commonly known as:  FLONASE USE ONE TO TWO SPRAY(S) IN EACH NOSTRIL ONCE DAILY FOR STUFFY NOSE OR DRAINAGE   Ibuprofen 200 MG Caps Take by mouth as needed.   ipratropium-albuterol 0.5-2.5 (3) MG/3ML Soln Commonly known as:  DUONEB Take 3 mLs by nebulization every 4 (four) hours as needed.   losartan 100 MG tablet Commonly known as:  COZAAR Take 100 mg by mouth daily.   omalizumab 150 MG injection Commonly known as:  XOLAIR Inject into the skin.   omeprazole 40 MG capsule Commonly known as:  PRILOSEC TAKE 1 CAPSULE BY MOUTH TWICE DAILY   pioglitazone 30 MG tablet Commonly known as:  ACTOS Take 30 mg by mouth daily.   PROBIOTIC DAILY PO Take 1 capsule by mouth daily.   VICTOZA 18 MG/3ML Sopn Generic drug:  liraglutide Inject into the skin.   VITAMIN D PO Take by mouth daily.  Past Medical History:  Diagnosis Date  . Allergy   . Anxiety   . Cataract    bilateral and removed  . Depression   . Diaphragmatic hernia without mention of obstruction or gangrene   . DM (diabetes mellitus) ([place])   . Esophageal reflux   . Esophagitis, unspecified   . H/O adenomatous polyp of colon 2011  . Hyperlipidemia   . Incontinence of feces   . Irritable bowel syndrome   . Kidney stones   . Sarcoidosis   . Sleep apnea    no cpap now  . Status post dilation of esophageal narrowing   . Unspecified asthma(493.90)   . Unspecified essential hypertension   . Unspecified hearing loss     Past Surgical History:  Procedure Laterality Date  . BLADDER SURGERY    .  CATARACT EXTRACTION Bilateral   . HARDWARE REMOVAL Left 01/12/2017   Procedure: HARDWARE REMOVAL left wrist;  Surgeon: Leanora Cover, MD;  Location: [place];  Service: Orthopedics;  Laterality: Left;  . HEMORRHOID SURGERY    . NASAL SEPTUM SURGERY    . PARTIAL HYSTERECTOMY    . TONSILLECTOMY AND ADENOIDECTOMY    . TRIGGER FINGER RELEASE Left 05/12/2016   Procedure: RELEASE TRIGGER FINGER/A-1 PULLEY INFECTION LEFT RING TENDON SHEATH INJECT RIGHT INDEX FINGER;  Surgeon: Leanora Cover, MD;  Location: [place];  Service: Orthopedics;  Laterality: Left;  . TUBAL LIGATION      Review of systems negative except as noted in HPI / PMHx or noted below:  Review of Systems  Constitutional: Negative.   HENT: Negative.   Eyes: Negative.   Respiratory: Negative.   Cardiovascular: Negative.   Gastrointestinal: Negative.   Genitourinary: Negative.   Musculoskeletal: Negative.   Skin: Negative.   Neurological: Negative.   Endo/Heme/Allergies: Negative.   Psychiatric/Behavioral: Negative.      Objective:   Vitals:   09/13/17 1502  BP: (!) 152/68  Pulse: 64  Resp: 20  SpO2: 95%          Physical Exam  Constitutional: She is well-developed, well-nourished, and in no distress.  HENT:  Head: Normocephalic.  Right Ear: Tympanic membrane, external ear and ear canal normal.  Left Ear: Tympanic membrane, external ear and ear canal normal.  Nose: Nose normal. No mucosal edema or rhinorrhea.  Mouth/Throat: Uvula is midline, oropharynx is clear and moist and mucous membranes are normal. No oropharyngeal exudate.  Eyes: Conjunctivae are normal.  Neck: Trachea normal. No tracheal tenderness present. No tracheal deviation present. No thyromegaly present.  Cardiovascular: Normal rate, regular rhythm, S1 normal and S2 normal.   Murmur (systolic) heard. Pulmonary/Chest: No stridor. No respiratory distress. She has wheezes (bilateral expiratory wheezes posterior lung  field). She has no rales.  Musculoskeletal: She exhibits no edema.  Lymphadenopathy:       Head (right side): No tonsillar adenopathy present.       Head (left side): No tonsillar adenopathy present.    She has no cervical adenopathy.  Neurological: She is alert. Gait normal.  Skin: No rash noted. She is not diaphoretic. No erythema. Nails show no clubbing.  Psychiatric: Mood and affect normal.    Diagnostics:    Spirometry was performed and demonstrated an FEV1 of 0.80 at 42 % of predicted.  The patient had an Asthma Control Test with the following results: ACT Total Score: 12.    Assessment and Plan:   1. Asthma, not well controlled, severe persistent, with acute exacerbation  2. Other allergic rhinitis   3. LPRD (laryngopharyngeal reflux disease)     1. Continue Xolair and Epi-Pen  2. Restart Dulera 200 two inhalations two times per day with spacer  3. Add SAMPLES of Qvar 80 REDIHALER two inhalations two times per day to [facility] during "flare up".  4. Continue Flonase - one spray each nostril 1-2 times per day  5. Continue omeprazole 40 twice a day + ranitidine 300 in evening  6. Continue nasal saline, ProAir HFA, Duoneb if needed.  7. Can add OTC Loratadine / Claritin 10 mg one tablet 1-2 times per day if needed  8. Can add OTC Mucinex DM 1-2 tablet 1-2 times per day if needed  9. Can add ipratropium 0.06% nasal spray - one-2 sprays each nostril every 6 hours as needed to dry up nose  10. Depo-Medrol 40 IM delivered in clinic today  11. Return in 6 months or earlier if problem.  [name] has an obvious exacerbation of her lower airway inflammatory condition and I will treat her with a relatively low dose systemic steroids and have her restart Dulera. For her rhinorrhea she can utilize nasal ipratropium. She will continue on other anti-inflammatory agents for her respiratory tract and treatment directed against reflux as noted above and I will see her back in this  clinic in 6 months or earlier if there is a problem.  Allena Katz, MD Allergy / Immunology [place]

## 2017-09-15 DIAGNOSIS — D649 Anemia, unspecified: Secondary | ICD-10-CM | POA: Diagnosis not present

## 2017-09-15 DIAGNOSIS — E1122 Type 2 diabetes mellitus with diabetic chronic kidney disease: Secondary | ICD-10-CM | POA: Diagnosis not present

## 2017-09-22 DIAGNOSIS — I10 Essential (primary) hypertension: Secondary | ICD-10-CM | POA: Diagnosis not present

## 2017-09-22 DIAGNOSIS — Z6831 Body mass index (BMI) 31.0-31.9, adult: Secondary | ICD-10-CM | POA: Diagnosis not present

## 2017-09-22 DIAGNOSIS — Z9181 History of falling: Secondary | ICD-10-CM | POA: Diagnosis not present

## 2017-09-22 DIAGNOSIS — Z1339 Encounter for screening examination for other mental health and behavioral disorders: Secondary | ICD-10-CM | POA: Diagnosis not present

## 2017-09-22 DIAGNOSIS — Z23 Encounter for immunization: Secondary | ICD-10-CM | POA: Diagnosis not present

## 2017-09-26 DIAGNOSIS — J4540 Moderate persistent asthma, uncomplicated: Secondary | ICD-10-CM

## 2017-09-27 ENCOUNTER — Ambulatory Visit (INDEPENDENT_AMBULATORY_CARE_PROVIDER_SITE_OTHER): Payer: Medicare Other | Admitting: *Deleted

## 2017-09-27 DIAGNOSIS — J4540 Moderate persistent asthma, uncomplicated: Secondary | ICD-10-CM | POA: Diagnosis not present

## 2017-10-23 ENCOUNTER — Ambulatory Visit (INDEPENDENT_AMBULATORY_CARE_PROVIDER_SITE_OTHER): Payer: Medicare Other | Admitting: Allergy and Immunology

## 2017-10-23 ENCOUNTER — Encounter: Payer: Self-pay | Admitting: Allergy and Immunology

## 2017-10-23 VITALS — BP 140/66 | HR 76 | Resp 18

## 2017-10-23 DIAGNOSIS — J4551 Severe persistent asthma with (acute) exacerbation: Secondary | ICD-10-CM | POA: Diagnosis not present

## 2017-10-23 DIAGNOSIS — K219 Gastro-esophageal reflux disease without esophagitis: Secondary | ICD-10-CM

## 2017-10-23 DIAGNOSIS — J3089 Other allergic rhinitis: Secondary | ICD-10-CM | POA: Diagnosis not present

## 2017-10-23 MED ORDER — AZITHROMYCIN 500 MG PO TABS: 500.0000 mg | ORAL_TABLET | Freq: Every day | ORAL | 0 refills | Status: AC

## 2017-10-23 MED ORDER — METHYLPREDNISOLONE ACETATE 80 MG/ML IJ SUSP
80.0000 mg | Freq: Once | INTRAMUSCULAR | Status: AC
  Administered 2017-10-23: 80 mg via INTRAMUSCULAR

## 2017-10-23 NOTE — Progress Notes (Signed)
Follow-up Note  Referring Provider: Myer Peer, MD Primary Provider: Myer Peer, MD Date of Office Visit: 10/23/2017  Subjective:   [name] (DOB: 08-Apr-1946) is a 71 y.o. female who returns to the [facility] [place] on 10/23/2017 in re-evaluation of the following:  HPI: Terita presents to this clinic in reevaluation of her COPD with component of severe asthma, allergic rhinitis, and LPR.  Her last visit to this clinic was 13 September 2017 at which point in time she appears to have an asthma flare requiring the administration of low-dose systemic steroids.  She has not really recovered since that event.  She still continues to cough and wheeze and must use a bronchodilator a few times a day although this does really not disturb her sleep to any large degree.  She does have some shortness of breath and most importantly has yellow sputum production.  She does not have a significant increase in her nasal symptoms or throat issues or reflux issues during this event.  She has not really noticed much leg swelling although she does have a chronic issue involving her left leg with swelling ever since she had surgery on on that leg.  She has not had any high fever or chest pain.  Allergies as of 10/23/2017      Reactions   Codeine Nausea And Vomiting   Sulfa Antibiotics Other (See Comments)   Allergic per pt's mother       Medication List      albuterol 108 (90 Base) MCG/ACT inhaler Commonly known as:  VENTOLIN HFA Inhale two puffs every 4-6 hours if needed for cough or wheeze   atorvastatin 20 MG tablet Commonly known as:  LIPITOR Take 20 mg by mouth daily.   beclomethasone 80 MCG/ACT inhaler Commonly known as:  QVAR REDIHALER Inhale two doses twice daily during flare-up.  Rinse, gargle, and spit after use.   citalopram 20 MG tablet Commonly known as:  CELEXA Take 20 mg by mouth daily.   dicyclomine 10 MG capsule Commonly known as:  BENTYL   DULERA  200-5 MCG/ACT Aero Generic drug:  mometasone-formoterol Inhale 2 puffs into the lungs 2 (two) times daily.   etodolac 500 MG tablet Commonly known as:  LODINE Take 500 mg by mouth 2 (two) times daily.   fluticasone 50 MCG/ACT nasal spray Commonly known as:  FLONASE USE ONE TO TWO SPRAY(S) IN EACH NOSTRIL ONCE DAILY FOR STUFFY NOSE OR DRAINAGE   Ibuprofen 200 MG Caps Take by mouth as needed.   ipratropium 0.06 % nasal spray Commonly known as:  ATROVENT Use two sprays in each nostril every 6 hours if needed for runny nose.   ipratropium-albuterol 0.5-2.5 (3) MG/3ML Soln Commonly known as:  DUONEB Take 3 mLs by nebulization every 4 (four) hours as needed.   losartan 100 MG tablet Commonly known as:  COZAAR Take 100 mg by mouth daily.   omalizumab 150 MG injection Commonly known as:  XOLAIR Inject into the skin.   omeprazole 40 MG capsule Commonly known as:  PRILOSEC TAKE 1 CAPSULE BY MOUTH TWICE DAILY   pioglitazone 30 MG tablet Commonly known as:  ACTOS Take 30 mg by mouth daily.   PROBIOTIC DAILY PO Take 1 capsule by mouth daily.   VICTOZA 18 MG/3ML Sopn Generic drug:  liraglutide Inject into the skin.   VITAMIN D PO Take by mouth daily.       Past Medical History:  Diagnosis Date  .  Allergy   . Anxiety   . Cataract    bilateral and removed  . Depression   . Diaphragmatic hernia without mention of obstruction or gangrene   . DM (diabetes mellitus) ([place])   . Esophageal reflux   . Esophagitis, unspecified   . H/O adenomatous polyp of colon 2011  . Hyperlipidemia   . Incontinence of feces   . Irritable bowel syndrome   . Kidney stones   . Sarcoidosis   . Sleep apnea    no cpap now  . Status post dilation of esophageal narrowing   . Unspecified asthma(493.90)   . Unspecified essential hypertension   . Unspecified hearing loss     Past Surgical History:  Procedure Laterality Date  . BLADDER SURGERY    . CATARACT EXTRACTION Bilateral   .  HARDWARE REMOVAL Left 01/12/2017   Procedure: HARDWARE REMOVAL left wrist;  Surgeon: Leanora Cover, MD;  Location: [place];  Service: Orthopedics;  Laterality: Left;  . HEMORRHOID SURGERY    . NASAL SEPTUM SURGERY    . PARTIAL HYSTERECTOMY    . TONSILLECTOMY AND ADENOIDECTOMY    . TRIGGER FINGER RELEASE Left 05/12/2016   Procedure: RELEASE TRIGGER FINGER/A-1 PULLEY INFECTION LEFT RING TENDON SHEATH INJECT RIGHT INDEX FINGER;  Surgeon: Leanora Cover, MD;  Location: [facility];  Service: Orthopedics;  Laterality: Left;  . TUBAL LIGATION      Review of systems negative except as noted in HPI / PMHx or noted below:  Review of Systems  Constitutional: Negative.   HENT: Negative.   Eyes: Negative.   Respiratory: Negative.   Cardiovascular: Negative.   Gastrointestinal: Negative.   Genitourinary: Negative.   Musculoskeletal: Negative.   Skin: Negative.   Neurological: Negative.   Endo/Heme/Allergies: Negative.   Psychiatric/Behavioral: Negative.      Objective:   Vitals:   10/23/17 1351  BP: 140/66  Pulse: 76  Resp: 18  SpO2: 97%          Physical Exam  Constitutional: She is well-developed, well-nourished, and in no distress.  HENT:  Head: Normocephalic.  Right Ear: External ear normal.  Left Ear: External ear normal.  Nose: Nose normal. No mucosal edema or rhinorrhea.  Mouth/Throat: Uvula is midline, oropharynx is clear and moist and mucous membranes are normal. No oropharyngeal exudate.  Eyes: Conjunctivae are normal.  Neck: Trachea normal. No tracheal tenderness present. No tracheal deviation present. No thyromegaly present.  Cardiovascular: Normal rate, regular rhythm, S1 normal, S2 normal and normal heart sounds.  No murmur heard. Pulmonary/Chest: No stridor. No respiratory distress. She has wheezes (Bilateral expiratory wheezes posterior lung fields ). She has no rales.  Musculoskeletal: She exhibits edema (Slight trace pitting edema  left greater than right lower extremity).  Lymphadenopathy:       Head (right side): No tonsillar adenopathy present.       Head (left side): No tonsillar adenopathy present.    She has no cervical adenopathy.  Neurological: She is alert. Gait normal.  Skin: No rash noted. She is not diaphoretic. No erythema. Nails show no clubbing.  Psychiatric: Mood and affect normal.    Diagnostics:    Spirometry was performed and demonstrated an FEV1 of 0.91 at 48 % of predicted.  The patient had an Asthma Control Test with the following results: ACT Total Score: 8.    Assessment and Plan:   1. Asthma, not well controlled, severe persistent, with acute exacerbation   2. Other allergic rhinitis  3. LPRD (laryngopharyngeal reflux disease)     1. Continue Xolair and Epi-Pen  2. Continue Dulera 200 two inhalations two times per day with spacer  3. Add SAMPLES of Qvar 80 REDIHALER two inhalations two times per day to [facility] during "flare up".  4. Continue Flonase - one spray each nostril 1-2 times per day  5. Continue omeprazole 40 twice a day + ranitidine 300 in evening  6. Continue nasal saline, ProAir HFA, Duoneb if needed.  7. Can add OTC Loratadine / Claritin 10 mg one tablet 1-2 times per day if needed  8. Can add OTC Mucinex DM 1-2 tablet 1-2 times per day if needed  9. Can add ipratropium 0.06% nasal spray - 1-2 sprays each nostril every 6 hours if needed  10. For this recent episode:   A.  Depo-Medrol 80 IM delivered in clinic today  B.  Azithromycin 500 mg once a day for 3 days only  C. Prednisone 10mg  tablet one time per day for 10 days only  11. Return in 6 months or earlier if problem.    I will assume that Talita has continued inflammation and possibly a low-grade infection of her airway and treat her with the combination of therapy noted above to address each issue.  I will give her azithromycin to cover the possibility of a lingering mycoplasma infection.  She will  contact me noting her response over the course of the next several weeks and if she does well I will see her back in this clinic in 6 months or earlier if there is a problem.  Allena Katz, MD Allergy / Immunology [place]

## 2017-10-23 NOTE — Patient Instructions (Addendum)
  1. Continue Xolair and Epi-Pen  2. Continue Dulera 200 two inhalations two times per day with spacer  3. Add SAMPLES of Qvar 80 REDIHALER two inhalations two times per day to [facility] during "flare up".  4. Continue Flonase - one spray each nostril 1-2 times per day  5. Continue omeprazole 40 twice a day + ranitidine 300 in evening  6. Continue nasal saline, ProAir HFA, Duoneb if needed.  7. Can add OTC Loratadine / Claritin 10 mg one tablet 1-2 times per day if needed  8. Can add OTC Mucinex DM 1-2 tablet 1-2 times per day if needed  9. Can add ipratropium 0.06% nasal spray - 1-2 sprays each nostril every 6 hours if needed  10. For this recent episode:   A.  Depo-Medrol 80 IM delivered in clinic today  B.  Azithromycin 500 mg once a day for 3 days only  C. Prednisone 10mg  tablet one time per day for 10 days only  11. Return in 6 months or earlier if problem.

## 2017-10-24 ENCOUNTER — Encounter: Payer: Self-pay | Admitting: Allergy and Immunology

## 2017-10-24 DIAGNOSIS — J4540 Moderate persistent asthma, uncomplicated: Secondary | ICD-10-CM

## 2017-10-25 ENCOUNTER — Ambulatory Visit (INDEPENDENT_AMBULATORY_CARE_PROVIDER_SITE_OTHER): Payer: Medicare Other | Admitting: *Deleted

## 2017-10-25 DIAGNOSIS — J4540 Moderate persistent asthma, uncomplicated: Secondary | ICD-10-CM

## 2017-11-06 ENCOUNTER — Ambulatory Visit: Payer: Medicare Other | Admitting: Allergy and Immunology

## 2017-11-13 ENCOUNTER — Other Ambulatory Visit: Payer: Self-pay | Admitting: Allergy and Immunology

## 2017-11-20 DIAGNOSIS — J4540 Moderate persistent asthma, uncomplicated: Secondary | ICD-10-CM

## 2017-11-22 ENCOUNTER — Ambulatory Visit (INDEPENDENT_AMBULATORY_CARE_PROVIDER_SITE_OTHER): Payer: Medicare Other | Admitting: *Deleted

## 2017-11-22 DIAGNOSIS — J4540 Moderate persistent asthma, uncomplicated: Secondary | ICD-10-CM

## 2017-12-08 DIAGNOSIS — Z6832 Body mass index (BMI) 32.0-32.9, adult: Secondary | ICD-10-CM | POA: Diagnosis not present

## 2017-12-08 DIAGNOSIS — D519 Vitamin B12 deficiency anemia, unspecified: Secondary | ICD-10-CM | POA: Diagnosis not present

## 2017-12-08 DIAGNOSIS — D649 Anemia, unspecified: Secondary | ICD-10-CM | POA: Diagnosis not present

## 2017-12-11 ENCOUNTER — Encounter (INDEPENDENT_AMBULATORY_CARE_PROVIDER_SITE_OTHER): Payer: Self-pay | Admitting: Orthopaedic Surgery

## 2017-12-11 ENCOUNTER — Other Ambulatory Visit (INDEPENDENT_AMBULATORY_CARE_PROVIDER_SITE_OTHER): Payer: Self-pay

## 2017-12-11 ENCOUNTER — Ambulatory Visit (INDEPENDENT_AMBULATORY_CARE_PROVIDER_SITE_OTHER): Payer: Medicare Other | Admitting: Orthopaedic Surgery

## 2017-12-11 VITALS — BP 172/58 | HR 65 | Resp 18 | Ht 62.0 in | Wt 165.0 lb

## 2017-12-11 DIAGNOSIS — M19012 Primary osteoarthritis, left shoulder: Secondary | ICD-10-CM | POA: Insufficient documentation

## 2017-12-11 DIAGNOSIS — M25512 Pain in left shoulder: Principal | ICD-10-CM

## 2017-12-11 DIAGNOSIS — G8929 Other chronic pain: Secondary | ICD-10-CM

## 2017-12-11 HISTORY — DX: Primary osteoarthritis, left shoulder: M19.012

## 2017-12-11 NOTE — Progress Notes (Signed)
Office Visit Note   Patient: [name]           Date of Birth: 06-18-1946           MRN: [id number] Visit Date: 12/11/2017              Requested by: Myer Peer, MD [place], [place] [id number] PCP: Myer Peer, MD   Assessment & Plan: Visit Diagnoses:  1. Primary osteoarthritis, left shoulder     Plan: [name] would like to proceed with shoulder replacement left shoulder. To date she's had pain for "years" and has had temporary relief with cortisone injection, medicines and exercises. She's reached a point where she is having compromise of her activities. I will refer to [name] for consideration of total shoulder replacement. We'll order a thin slice CT scan in anticipation of her office visit. Long discussion regarding the above and decision to consider total shoulder replacement. No further questions  Follow-Up Instructions: Return if symptoms worsen or fail to improve.   Orders:  No orders of the defined types were placed in this encounter.  No orders of the defined types were placed in this encounter.     Procedures: No procedures performed   Clinical Data: No additional findings.   Subjective: Chief Complaint  Patient presents with  . Left Shoulder - Pain, Weakness, Numbness, Edema    [name] is a 72 y o here today for chronic Last cortisone injection 04/13/17. Didn't help very much. Wants to discuss L shoulder surgery  Long history of left shoulder pain consistent with primary osteoarthritis. Prior films demonstrate no elevation of the humeral head from the glenoid. Has limited range of motion, pain and only temporary relief with prior treatment. No numbness or tingling  HPI  Review of Systems  Constitutional: Negative for chills, fatigue and fever.  HENT: Positive for sinus pain.   Eyes: Negative for itching.  Respiratory: Positive for shortness of breath and wheezing. Negative for chest tightness.   Cardiovascular:  Negative for chest pain, palpitations and leg swelling.  Gastrointestinal: Negative for blood in stool, constipation and diarrhea.  Endocrine: Negative for polyuria.  Genitourinary: Negative for dysuria.  Musculoskeletal: Positive for back pain. Negative for joint swelling, neck pain and neck stiffness.  Allergic/Immunologic: Negative for immunocompromised state.  Neurological: Negative for dizziness and numbness.  Hematological: Does not bruise/bleed easily.  Psychiatric/Behavioral: The patient is not nervous/anxious.      Objective: Vital Signs: BP (!) 172/58   Pulse 65   Resp 18   Ht 5\' 2"  (1.575 m)   Wt 165 lb (74.8 kg)   BMI 30.18 kg/m   Physical Exam  Ortho Exam left shoulder with limited range of motion. I measured 110 of flexion. 10 of external rotation 80 of internal rotation. Good grip and good release. Skin intact. Neurovascular exam intact. No pain with range of motion of cervical spine. Does have positive grinding with internal/external rotation with the arm in the impingement position. Biceps intact  Specialty Comments:  No specialty comments available.  Imaging: No results found.   PMFS History: Patient Active Problem List   Diagnosis Date Noted  . Primary osteoarthritis, left shoulder 12/11/2017  . DDD (degenerative disc disease), lumbar 01/23/2017  . History of diabetes mellitus 01/23/2017  . History of depression 01/23/2017  . History of asthma 01/23/2017  . Dyslipidemia 01/23/2017  . Age-related osteoporosis without current pathological fracture 01/23/2017  . History of gastroesophageal reflux (GERD)  01/17/2017  . History of hypertension 01/17/2017  . Primary osteoarthritis of both knees 01/17/2017  . Primary osteoarthritis of both feet 01/17/2017  . Vitamin D deficiency 01/17/2017  . Rheumatoid factor positive 12/13/2016  . Primary osteoarthritis of both hands 12/13/2016  . Obstructive lung disease ([place]) 07/29/2015  . H/O allergic rhinitis  07/29/2015  . LPRD (laryngopharyngeal reflux disease) 07/29/2015  . Chest pain 11/01/2013  . Murmur 11/01/2013  . DM 03/03/2010  . ABDOMINAL PAIN RIGHT LOWER QUADRANT 03/03/2010  . FULL INCONTINENCE OF FECES 03/03/2010  . DECREASED HEARING 05/02/2008  . HYPERTENSION 05/02/2008  . ESOPHAGITIS 05/02/2008  . HIATAL HERNIA 05/02/2008  . RECTAL INCONTINENCE 05/02/2008  . SARCOIDOSIS 11/16/2007  . Severe persistent asthma 11/16/2007  . GERD 11/16/2007  . IRRITABLE BOWEL SYNDROME 11/16/2007   Past Medical History:  Diagnosis Date  . Allergy   . Anxiety   . Cataract    bilateral and removed  . Depression   . Diaphragmatic hernia without mention of obstruction or gangrene   . DM (diabetes mellitus) ([place])   . Esophageal reflux   . Esophagitis, unspecified   . H/O adenomatous polyp of colon 2011  . Hyperlipidemia   . Incontinence of feces   . Irritable bowel syndrome   . Kidney stones   . Sarcoidosis   . Sleep apnea    no cpap now  . Status post dilation of esophageal narrowing   . Unspecified asthma(493.90)   . Unspecified essential hypertension   . Unspecified hearing loss     Family History  Problem Relation Age of Onset  . Esophageal cancer Father   . Diabetes Mother   . Diabetes Maternal Grandmother   . Colon cancer Neg Hx   . Rectal cancer Neg Hx   . Stomach cancer Neg Hx     Past Surgical History:  Procedure Laterality Date  . BLADDER SURGERY    . CATARACT EXTRACTION Bilateral   . HARDWARE REMOVAL Left 01/12/2017   Procedure: HARDWARE REMOVAL left wrist;  Surgeon: Leanora Cover, MD;  Location: [facility];  Service: Orthopedics;  Laterality: Left;  . HEMORRHOID SURGERY    . NASAL SEPTUM SURGERY    . PARTIAL HYSTERECTOMY    . TONSILLECTOMY AND ADENOIDECTOMY    . TRIGGER FINGER RELEASE Left 05/12/2016   Procedure: RELEASE TRIGGER FINGER/A-1 PULLEY INFECTION LEFT RING TENDON SHEATH INJECT RIGHT INDEX FINGER;  Surgeon: Leanora Cover, MD;  Location: [facility];  Service: Orthopedics;  Laterality: Left;  . TUBAL LIGATION     Social History   Occupational History  . Occupation: retired  Tobacco Use  . Smoking status: Never Smoker  . Smokeless tobacco: Never Used  Substance and Sexual Activity  . Alcohol use: No  . Drug use: No  . Sexual activity: Not on file

## 2017-12-12 ENCOUNTER — Other Ambulatory Visit (INDEPENDENT_AMBULATORY_CARE_PROVIDER_SITE_OTHER): Payer: Self-pay

## 2017-12-12 DIAGNOSIS — M19012 Primary osteoarthritis, left shoulder: Secondary | ICD-10-CM

## 2017-12-18 ENCOUNTER — Telehealth (INDEPENDENT_AMBULATORY_CARE_PROVIDER_SITE_OTHER): Payer: Self-pay | Admitting: *Deleted

## 2017-12-18 NOTE — Telephone Encounter (Signed)
Pt is scheduled for CT on Jan 24 at 2pm, pt needs to arrive at 145p for checkin at [address], [employer]. Left message on both home and mobile to return call for appt information

## 2017-12-18 NOTE — Telephone Encounter (Signed)
Pt aware of appt and message sent to [address] for follow upo appt

## 2017-12-19 DIAGNOSIS — J4540 Moderate persistent asthma, uncomplicated: Secondary | ICD-10-CM | POA: Diagnosis not present

## 2017-12-20 ENCOUNTER — Ambulatory Visit (INDEPENDENT_AMBULATORY_CARE_PROVIDER_SITE_OTHER): Payer: Medicare Other

## 2017-12-20 ENCOUNTER — Ambulatory Visit: Payer: Medicare Other

## 2017-12-20 DIAGNOSIS — J4540 Moderate persistent asthma, uncomplicated: Secondary | ICD-10-CM | POA: Diagnosis not present

## 2017-12-21 ENCOUNTER — Ambulatory Visit (INDEPENDENT_AMBULATORY_CARE_PROVIDER_SITE_OTHER): Payer: Medicare Other | Admitting: Orthopedic Surgery

## 2017-12-22 ENCOUNTER — Ambulatory Visit
Admission: RE | Admit: 2017-12-22 | Discharge: 2017-12-22 | Disposition: A | Discharge status: Home / Self Care | Payer: Medicare Other | Source: Ambulatory Visit | Attending: Orthopaedic Surgery | Admitting: Orthopaedic Surgery

## 2017-12-22 DIAGNOSIS — G8929 Other chronic pain: Secondary | ICD-10-CM

## 2017-12-22 DIAGNOSIS — M19012 Primary osteoarthritis, left shoulder: Secondary | ICD-10-CM | POA: Diagnosis not present

## 2017-12-22 DIAGNOSIS — M25512 Pain in left shoulder: Principal | ICD-10-CM

## 2017-12-27 ENCOUNTER — Encounter (INDEPENDENT_AMBULATORY_CARE_PROVIDER_SITE_OTHER): Payer: Self-pay | Admitting: Orthopedic Surgery

## 2017-12-27 ENCOUNTER — Ambulatory Visit (INDEPENDENT_AMBULATORY_CARE_PROVIDER_SITE_OTHER): Payer: Medicare Other | Admitting: Orthopedic Surgery

## 2017-12-27 DIAGNOSIS — M19012 Primary osteoarthritis, left shoulder: Secondary | ICD-10-CM

## 2017-12-29 ENCOUNTER — Encounter (INDEPENDENT_AMBULATORY_CARE_PROVIDER_SITE_OTHER): Payer: Self-pay | Admitting: Orthopedic Surgery

## 2017-12-29 NOTE — Progress Notes (Signed)
Office Visit Note   Patient: [name]           Date of Birth: 04-30-1946           MRN: [id number] Visit Date: 12/27/2017 Requested by: Myer Peer, MD [place], [place] [id number] PCP: Myer Peer, MD  Subjective: Chief Complaint  Patient presents with  . Left Shoulder - Pain    HPI: [name] is a 72 year old patient with left shoulder pain and arthritis.  She reports chronic shoulder pain.  She has had injections which have not given her relief.  She is right-hand dominant.  She is here to discuss surgical options.  She takes Tylenol arthritis for her symptoms.  The patient does state that her shoulder pain wakes her from sleep at night.  She has had a CT scan which shows end-stage glenohumeral arthritis with fairly minimal glenoid deformity.              ROS: All systems reviewed are negative as they relate to the chief complaint within the history of present illness.  Patient denies  fevers or chills.   Assessment & Plan: Visit Diagnoses:  1. Primary osteoarthritis, left shoulder     Plan: Impression is arthritis in a 72 year old female.  Shoulder arthritis is severe.  Plan for this patient is reverse shoulder replacement.  Risk and benefits are discussed.  All questions answered.  Anticipate good pain relief and reasonable function.  She still does have forward flexion and abduction just above 90.  I think she should maintain that with reverse shoulder replacement in this scenario.  I think her pain relief should be good.  The risk and benefits of surgery are discussed including but not limited to infection nerve vessel damage dislocation as well as potential need for revision surgery.  All questions answered.  We will use the CT scan for patient templating for optimal implant positioning  Follow-Up Instructions: No Follow-up on file.   Orders:  No orders of the defined types were placed in this encounter.  No orders of the defined types were  placed in this encounter.     Procedures: No procedures performed   Clinical Data: No additional findings.  Objective: Vital Signs: There were no vitals taken for this visit.  Physical Exam:   Constitutional: Patient appears well-developed HEENT:  Head: Normocephalic Eyes:EOM are normal Neck: Normal range of motion Cardiovascular: Normal rate Pulmonary/chest: Effort normal Neurologic: Patient is alert Skin: Skin is warm Psychiatric: Patient has normal mood and affect    Ortho Exam: Orthopedic exam demonstrates good cervical spine range of motion.  She has 5 out of 5 grip EPL FPL interosseous wrist flexion wrist extension biceps triceps and deltoid strength.  She does have limited external rotation at 15 degrees of abduction to about 35 degrees on the left.  Forward flexion and abduction also to about 90 degrees.  There is some limitation due to arthritic capsular contracture.  No AC joint tenderness on the left-hand side.  No other masses lymphadenopathy or skin changes noted in the left shoulder girdle region.  Specialty Comments:  No specialty comments available.  Imaging: No results found.   PMFS History: Patient Active Problem List   Diagnosis Date Noted  . Primary osteoarthritis, left shoulder 12/11/2017  . DDD (degenerative disc disease), lumbar 01/23/2017  . History of diabetes mellitus 01/23/2017  . History of depression 01/23/2017  . History of asthma 01/23/2017  . Dyslipidemia 01/23/2017  . Age-related  osteoporosis without current pathological fracture 01/23/2017  . History of gastroesophageal reflux (GERD) 01/17/2017  . History of hypertension 01/17/2017  . Primary osteoarthritis of both knees 01/17/2017  . Primary osteoarthritis of both feet 01/17/2017  . Vitamin D deficiency 01/17/2017  . Rheumatoid factor positive 12/13/2016  . Primary osteoarthritis of both hands 12/13/2016  . Obstructive lung disease ([place]) 07/29/2015  . H/O allergic rhinitis  07/29/2015  . LPRD (laryngopharyngeal reflux disease) 07/29/2015  . Chest pain 11/01/2013  . Murmur 11/01/2013  . DM 03/03/2010  . ABDOMINAL PAIN RIGHT LOWER QUADRANT 03/03/2010  . FULL INCONTINENCE OF FECES 03/03/2010  . DECREASED HEARING 05/02/2008  . HYPERTENSION 05/02/2008  . ESOPHAGITIS 05/02/2008  . HIATAL HERNIA 05/02/2008  . RECTAL INCONTINENCE 05/02/2008  . SARCOIDOSIS 11/16/2007  . Severe persistent asthma 11/16/2007  . GERD 11/16/2007  . IRRITABLE BOWEL SYNDROME 11/16/2007   Past Medical History:  Diagnosis Date  . Allergy   . Anxiety   . Cataract    bilateral and removed  . Depression   . Diaphragmatic hernia without mention of obstruction or gangrene   . DM (diabetes mellitus) ([place])   . Esophageal reflux   . Esophagitis, unspecified   . H/O adenomatous polyp of colon 2011  . Hyperlipidemia   . Incontinence of feces   . Irritable bowel syndrome   . Kidney stones   . Sarcoidosis   . Sleep apnea    no cpap now  . Status post dilation of esophageal narrowing   . Unspecified asthma(493.90)   . Unspecified essential hypertension   . Unspecified hearing loss     Family History  Problem Relation Age of Onset  . Esophageal cancer Father   . Diabetes Mother   . Diabetes Maternal Grandmother   . Colon cancer Neg Hx   . Rectal cancer Neg Hx   . Stomach cancer Neg Hx     Past Surgical History:  Procedure Laterality Date  . BLADDER SURGERY    . CATARACT EXTRACTION Bilateral   . HARDWARE REMOVAL Left 01/12/2017   Procedure: HARDWARE REMOVAL left wrist;  Surgeon: Leanora Cover, MD;  Location: [facility];  Service: Orthopedics;  Laterality: Left;  . HEMORRHOID SURGERY    . NASAL SEPTUM SURGERY    . PARTIAL HYSTERECTOMY    . TONSILLECTOMY AND ADENOIDECTOMY    . TRIGGER FINGER RELEASE Left 05/12/2016   Procedure: RELEASE TRIGGER FINGER/A-1 PULLEY INFECTION LEFT RING TENDON SHEATH INJECT RIGHT INDEX FINGER;  Surgeon: Leanora Cover, MD;  Location: [facility];  Service: Orthopedics;  Laterality: Left;  . TUBAL LIGATION     Social History   Occupational History  . Occupation: retired  Tobacco Use  . Smoking status: Never Smoker  . Smokeless tobacco: Never Used  Substance and Sexual Activity  . Alcohol use: No  . Drug use: No  . Sexual activity: Not on file

## 2018-01-05 ENCOUNTER — Telehealth (INDEPENDENT_AMBULATORY_CARE_PROVIDER_SITE_OTHER): Payer: Self-pay | Admitting: Orthopedic Surgery

## 2018-01-05 NOTE — Telephone Encounter (Signed)
Patient left message on 01/03/18 @ 1:31 requesting a surgery date with Dr.  Marlou Sa.  I found surgery sheet and returned call, but no answer.   Pt's cb  [phone]

## 2018-01-09 DIAGNOSIS — Z6831 Body mass index (BMI) 31.0-31.9, adult: Secondary | ICD-10-CM | POA: Diagnosis not present

## 2018-01-09 DIAGNOSIS — R600 Localized edema: Secondary | ICD-10-CM | POA: Diagnosis not present

## 2018-01-09 DIAGNOSIS — I358 Other nonrheumatic aortic valve disorders: Secondary | ICD-10-CM | POA: Diagnosis not present

## 2018-01-10 NOTE — Progress Notes (Signed)
Office Visit Note  Patient: [name]             Date of Birth: 1946-04-26           MRN: [id number]             PCP: Myer Peer, MD Referring: Myer Peer, MD Visit Date: 01/24/2018 Occupation: @GUAROCC @    Subjective:  Pain in multiple joints    History of Present Illness: [name] is a 72 y.o. female with history of osteoarthritis, DDD, and osteoporosis.  Patient states she is having pain in multiple joints.  She is having worsening pain in her bilateral knee joints.  She states that she is having increased stiffness in bilateral knees.  She states that the pain is worse after standing for prolonged periods of time or climbing stairs.  She is having bilateral ankle edema. She reports pain in bilateral feet.  She states that her lower back pain occasionally causes discomfort and stiffness.  She reports she is have a left total shoulder arthroplasty by Dr. Marlou Sa on February 22, 2018.  She states she is having severe shoulder pain and limited ROM.  She also is having radiation of pain down her left arm. She denies any numbness or tingling.     Activities of Daily Living:  Patient reports morning stiffness for 1 hour.   Patient Reports nocturnal pain.  Difficulty dressing/grooming: Denies Difficulty climbing stairs: Reports Difficulty getting out of chair: Reports Difficulty using hands for taps, buttons, cutlery, and/or writing: Denies   Review of Systems  Constitutional: Positive for fatigue. Negative for weakness.  HENT: Positive for mouth dryness. Negative for mouth sores, trouble swallowing, trouble swallowing and nose dryness.   Eyes: Positive for dryness. Negative for pain, redness and visual disturbance.  Respiratory: Positive for cough (Asthma). Negative for hemoptysis and difficulty breathing.   Cardiovascular: Negative for chest pain, palpitations, hypertension, irregular heartbeat and swelling in legs/feet.  Gastrointestinal: Negative for blood in  stool, constipation and diarrhea.  Endocrine: Negative for increased urination.  Genitourinary: Negative for painful urination.  Musculoskeletal: Positive for arthralgias, joint pain, joint swelling and morning stiffness. Negative for myalgias, muscle weakness, muscle tenderness and myalgias.  Skin: Negative for color change, pallor, rash, hair loss, nodules/bumps, redness, skin tightness, ulcers and sensitivity to sunlight.  Allergic/Immunologic: Negative for susceptible to infections.  Neurological: Negative for dizziness, numbness and headaches.  Hematological: Negative for swollen glands.  Psychiatric/Behavioral: Positive for depressed mood. Negative for sleep disturbance. The patient is nervous/anxious.     PMFS History:  Patient Active Problem List   Diagnosis Date Noted  . Primary osteoarthritis, left shoulder 12/11/2017  . DDD (degenerative disc disease), lumbar 01/23/2017  . History of diabetes mellitus 01/23/2017  . History of depression 01/23/2017  . History of asthma 01/23/2017  . Dyslipidemia 01/23/2017  . Age-related osteoporosis without current pathological fracture 01/23/2017  . History of gastroesophageal reflux (GERD) 01/17/2017  . History of hypertension 01/17/2017  . Primary osteoarthritis of both knees 01/17/2017  . Primary osteoarthritis of both feet 01/17/2017  . Vitamin D deficiency 01/17/2017  . Rheumatoid factor positive 12/13/2016  . Primary osteoarthritis of both hands 12/13/2016  . Obstructive lung disease ([place]) 07/29/2015  . H/O allergic rhinitis 07/29/2015  . LPRD (laryngopharyngeal reflux disease) 07/29/2015  . Chest pain 11/01/2013  . Murmur 11/01/2013  . DM 03/03/2010  . ABDOMINAL PAIN RIGHT LOWER QUADRANT 03/03/2010  . FULL INCONTINENCE OF FECES 03/03/2010  . DECREASED HEARING 05/02/2008  .  HYPERTENSION 05/02/2008  . ESOPHAGITIS 05/02/2008  . HIATAL HERNIA 05/02/2008  . RECTAL INCONTINENCE 05/02/2008  . SARCOIDOSIS 11/16/2007  . Severe  persistent asthma 11/16/2007  . GERD 11/16/2007  . IRRITABLE BOWEL SYNDROME 11/16/2007    Past Medical History:  Diagnosis Date  . Allergy   . Anxiety   . Cataract    bilateral and removed  . Depression   . Diaphragmatic hernia without mention of obstruction or gangrene   . DM (diabetes mellitus) ([place])   . Esophageal reflux   . Esophagitis, unspecified   . H/O adenomatous polyp of colon 2011  . Hyperlipidemia   . Incontinence of feces   . Irritable bowel syndrome   . Kidney stones   . Sarcoidosis   . Sleep apnea    no cpap now  . Status post dilation of esophageal narrowing   . Unspecified asthma(493.90)   . Unspecified essential hypertension   . Unspecified hearing loss     Family History  Problem Relation Age of Onset  . Esophageal cancer Father   . Diabetes Mother   . Diabetes Maternal Grandmother   . Colon cancer Neg Hx   . Rectal cancer Neg Hx   . Stomach cancer Neg Hx    Past Surgical History:  Procedure Laterality Date  . BLADDER SURGERY    . CATARACT EXTRACTION Bilateral   . HARDWARE REMOVAL Left 01/12/2017   Procedure: HARDWARE REMOVAL left wrist;  Surgeon: Leanora Cover, MD;  Location: [facility];  Service: Orthopedics;  Laterality: Left;  . HEMORRHOID SURGERY    . NASAL SEPTUM SURGERY    . PARTIAL HYSTERECTOMY    . TONSILLECTOMY AND ADENOIDECTOMY    . TRIGGER FINGER RELEASE Left 05/12/2016   Procedure: RELEASE TRIGGER FINGER/A-1 PULLEY INFECTION LEFT RING TENDON SHEATH INJECT RIGHT INDEX FINGER;  Surgeon: Leanora Cover, MD;  Location: [facility];  Service: Orthopedics;  Laterality: Left;  . TUBAL LIGATION     Social History   Social History Narrative   Daily caffeine use.      Objective: Vital Signs: BP (!) 146/84 (BP Location: Right Arm, Patient Position: Sitting, Cuff Size: Normal)   Pulse 68   Resp 17   Ht 5\' 1"  (1.549 m)   Wt 176 lb (79.8 kg)   BMI 33.25 kg/m    Physical Exam  Constitutional: She is oriented to  person, place, and time. She appears well-developed and well-nourished.  HENT:  Head: Normocephalic and atraumatic.  Eyes: Conjunctivae and EOM are normal.  Neck: Normal range of motion.  Cardiovascular: Normal rate, regular rhythm, normal heart sounds and intact distal pulses.  Pulmonary/Chest: Effort normal and breath sounds normal.  Abdominal: Soft. Bowel sounds are normal.  Lymphadenopathy:    She has no cervical adenopathy.  Neurological: She is alert and oriented to person, place, and time.  Skin: Skin is warm and dry. Capillary refill takes less than 2 seconds.  Psychiatric: She has a normal mood and affect. Her behavior is normal.  Nursing note and vitals reviewed.    Musculoskeletal Exam: C-spine, thoracic, and lumbar spine good ROM.  No midline spinal tenderness.  No SI joint tenderness. Left shoulder very limited forward flexion and limited internal and external rotation.   Right shoulder full ROM.  Elbow joints, wrist joints, MCPs, PIPs, and DIPs good ROM.  PIP and DIP synovial thickening consistent with osteoarthritis.  Hip joints, ankle joints, MTPs, PIPs, and DIPs good ROM with no synovitis.  She has mild  trochanteric bursa tenderness bilaterally.  Limited knee extension.  She has warmth of her right knee with no effusion.  She has bilateral knee crepitus.   CDAI Exam: No CDAI exam completed.    Investigation: No additional findings.   Imaging: No results found.  Speciality Comments: No specialty comments available.    Procedures:  Large Joint Inj: R knee on 01/24/2018 11:39 AM Indications: pain Details: 27 G 1.5 in needle, medial approach  Arthrogram: No  Medications: 1.5 mL lidocaine 1 %; 40 mg triamcinolone acetonide 40 MG/ML Aspirate: 0 mL Outcome: tolerated well, no immediate complications Procedure, treatment alternatives, risks and benefits explained, specific risks discussed. Consent was given by the patient. Immediately prior to procedure a time out  was called to verify the correct patient, procedure, equipment, support staff and site/side marked as required. Patient was prepped and draped in the usual sterile fashion.     Allergies: Codeine and Sulfa antibiotics   Assessment / Plan:     Visit Diagnoses: Primary osteoarthritis of both hands: She has PIP and DIP synovial thickening in bilateral hands.  She has no synovitis or tenderness.  She wears a CMC brace occasionally on her right hand, which helps her pain.  She was advised to wear the brace more frequently.  Joint protection and muscle strengthening were discussed.    Primary osteoarthritis of both knees: She has bilateral knee discomfort, worse with standing for prolonged periods of time and climbing stairs.  She has bilateral knee crepitus and limited extension.  She has warmth of her right knee.  She requested a right knee cortisone injection today.  She tolerated the procedure well.    Primary osteoarthritis of both feet: She has osteoarthritic changes in bilateral feet. No synovitis on exam. Discussed the importance of wearing proper fitting shoes.    DDD (degenerative disc disease), lumbar: She has occasional discomfort in her lower back.  No radiation of pain or numbness.  She has no midline spinal tenderness.  Rheumatoid factor positive  Age-related osteoporosis without current pathological fracture: She was advised to follow up with PCP in regards to starting a treatment to manage her osteoporosis.  Her PCP orders her DEXA.   History of vitamin D deficiency  History of diabetes mellitus: She was advised to monitor her blood glucose closely following the cortisone injection.   History of hypertension: She was advised to monitor her blood pressure closely following the cortisone injection today.  Chronic pain of right knee: She has right knee crepitus, discomfort with ROM and warmth.  She does not have an effusion.  She requested a cortisone injection of her right knee  today in the office.  She tolerated the procedure well.  She was advised to ice and elevate her knees if they swell or cause discomfort.  She can take tylenol for pain relief.  She was advised to avoid NSAIDs 2 weeks prior to her total shoulder arthroplasty on February 22, 2018.   Chronic right shoulder pain: She has very limited forward flexion and internal rotation on exam.  She is having a right total shoulder arthroplasty on February 22, 2018 by Dr. Marlou Sa.   Other medical conditions are listed as follows:   History of gastroesophageal reflux (GERD)  History of asthma  History of depression    Orders: Orders Placed This Encounter  Procedures  . Large Joint Inj   No orders of the defined types were placed in this encounter.   Face-to-face time spent with patient was 30  minutes. Greater than 50% of time was spent in counseling and coordination of care.  Follow-Up Instructions: Return in about 6 months (around 07/24/2018) for Osteoarthritis.   Ofilia Neas, PA-C  Note - This record has been created using Dragon software.  Chart creation errors have been sought, but may not always  have been located. Such creation errors do not reflect on  the standard of medical care.

## 2018-01-12 DIAGNOSIS — R600 Localized edema: Secondary | ICD-10-CM | POA: Diagnosis not present

## 2018-01-12 DIAGNOSIS — I358 Other nonrheumatic aortic valve disorders: Secondary | ICD-10-CM | POA: Diagnosis not present

## 2018-01-16 DIAGNOSIS — J4540 Moderate persistent asthma, uncomplicated: Secondary | ICD-10-CM

## 2018-01-17 ENCOUNTER — Ambulatory Visit (INDEPENDENT_AMBULATORY_CARE_PROVIDER_SITE_OTHER): Payer: Medicare Other | Admitting: *Deleted

## 2018-01-17 DIAGNOSIS — J4540 Moderate persistent asthma, uncomplicated: Secondary | ICD-10-CM

## 2018-01-24 ENCOUNTER — Encounter: Payer: Self-pay | Admitting: Physician Assistant

## 2018-01-24 ENCOUNTER — Ambulatory Visit (INDEPENDENT_AMBULATORY_CARE_PROVIDER_SITE_OTHER): Payer: Medicare Other | Admitting: Physician Assistant

## 2018-01-24 VITALS — BP 146/84 | HR 68 | Resp 17 | Ht 61.0 in | Wt 176.0 lb

## 2018-01-24 DIAGNOSIS — Z8719 Personal history of other diseases of the digestive system: Secondary | ICD-10-CM

## 2018-01-24 DIAGNOSIS — M19071 Primary osteoarthritis, right ankle and foot: Secondary | ICD-10-CM

## 2018-01-24 DIAGNOSIS — M51369 Other intervertebral disc degeneration, lumbar region without mention of lumbar back pain or lower extremity pain: Secondary | ICD-10-CM

## 2018-01-24 DIAGNOSIS — M5136 Other intervertebral disc degeneration, lumbar region: Secondary | ICD-10-CM | POA: Diagnosis not present

## 2018-01-24 DIAGNOSIS — M19041 Primary osteoarthritis, right hand: Secondary | ICD-10-CM

## 2018-01-24 DIAGNOSIS — Z8679 Personal history of other diseases of the circulatory system: Secondary | ICD-10-CM

## 2018-01-24 DIAGNOSIS — Z8639 Personal history of other endocrine, nutritional and metabolic disease: Secondary | ICD-10-CM

## 2018-01-24 DIAGNOSIS — M19042 Primary osteoarthritis, left hand: Secondary | ICD-10-CM

## 2018-01-24 DIAGNOSIS — M25561 Pain in right knee: Secondary | ICD-10-CM

## 2018-01-24 DIAGNOSIS — M810 Age-related osteoporosis without current pathological fracture: Secondary | ICD-10-CM

## 2018-01-24 DIAGNOSIS — M19072 Primary osteoarthritis, left ankle and foot: Secondary | ICD-10-CM

## 2018-01-24 DIAGNOSIS — G8929 Other chronic pain: Secondary | ICD-10-CM

## 2018-01-24 DIAGNOSIS — M170 Bilateral primary osteoarthritis of knee: Secondary | ICD-10-CM | POA: Diagnosis not present

## 2018-01-24 DIAGNOSIS — Z8659 Personal history of other mental and behavioral disorders: Secondary | ICD-10-CM | POA: Diagnosis not present

## 2018-01-24 DIAGNOSIS — R7689 Other specified abnormal immunological findings in serum: Secondary | ICD-10-CM

## 2018-01-24 DIAGNOSIS — Z8709 Personal history of other diseases of the respiratory system: Secondary | ICD-10-CM

## 2018-01-24 DIAGNOSIS — R768 Other specified abnormal immunological findings in serum: Secondary | ICD-10-CM

## 2018-01-24 DIAGNOSIS — M25511 Pain in right shoulder: Secondary | ICD-10-CM

## 2018-01-24 MED ORDER — TRIAMCINOLONE ACETONIDE 40 MG/ML IJ SUSP
40.0000 mg | INTRAMUSCULAR | Status: AC | PRN
  Administered 2018-01-24: 40 mg via INTRA_ARTICULAR

## 2018-01-24 MED ORDER — LIDOCAINE HCL 1 % IJ SOLN
1.5000 mL | INTRAMUSCULAR | Status: AC | PRN
  Administered 2018-01-24: 1.5 mL

## 2018-01-29 ENCOUNTER — Ambulatory Visit (INDEPENDENT_AMBULATORY_CARE_PROVIDER_SITE_OTHER): Payer: Medicare Other | Admitting: Allergy and Immunology

## 2018-01-29 ENCOUNTER — Encounter: Payer: Self-pay | Admitting: Allergy and Immunology

## 2018-01-29 VITALS — BP 154/74 | HR 68 | Resp 16

## 2018-01-29 DIAGNOSIS — J4551 Severe persistent asthma with (acute) exacerbation: Secondary | ICD-10-CM

## 2018-01-29 DIAGNOSIS — R05 Cough: Secondary | ICD-10-CM | POA: Diagnosis not present

## 2018-01-29 DIAGNOSIS — R0609 Other forms of dyspnea: Secondary | ICD-10-CM

## 2018-01-29 DIAGNOSIS — J45901 Unspecified asthma with (acute) exacerbation: Secondary | ICD-10-CM | POA: Diagnosis not present

## 2018-01-29 DIAGNOSIS — J3089 Other allergic rhinitis: Secondary | ICD-10-CM | POA: Diagnosis not present

## 2018-01-29 DIAGNOSIS — K219 Gastro-esophageal reflux disease without esophagitis: Secondary | ICD-10-CM

## 2018-01-29 NOTE — Patient Instructions (Addendum)
  1. Continue Xolair and Epi-Pen  2. Continue Dulera 200 two inhalations two times per day with spacer  3. Add SAMPLES of Qvar 80 REDIHALER two inhalations two times per day to [facility] during "flare up".  4. Continue Flonase - one spray each nostril 1-2 times per day  5. Continue omeprazole 40 twice a day + ranitidine 300 in evening  6. Continue nasal saline, ProAir HFA, Duoneb if needed.  7. Can add OTC Loratadine / Claritin 10 mg one tablet 1-2 times per day if needed  8. Can add OTC Mucinex DM 1-2 tablet 1-2 times per day if needed  9. Can add ipratropium 0.06% nasal spray - 1-2 sprays each nostril every 6 hours if needed  10. For this recent episode: Prednisone 10mg  - two tablets one time per day for 7 days only  11. Obtain a chest x-ray  12. Obtain blood - CBC w/diff  13. Obtain nocturnal oximetry  14. Return to clinic in 4 weeks or earlier if problem

## 2018-01-29 NOTE — Progress Notes (Signed)
Follow-up Note  Referring Provider: Myer Peer, MD Primary Provider: Myer Peer, MD Date of Office Visit: 01/29/2018  Subjective:   [name] (DOB: [birth year]) is a 72 y.o. female who returns to the [facility] on 01/29/2018 in re-evaluation of the following:  HPI: [name] presents to this clinic in evaluation of COPD with component of asthma, allergic rhinitis, and LPR.  Her last visit to this clinic was 23 October 2017 at which point in time we treated her for an exacerbation of her respiratory tract disease with a systemic steroid and broad-spectrum antibiotic.  She did well as a result of that treatment but 2 weeks ago once again developed wheezing and coughing and using her bronchodilator.  The biggest complaint she has is dyspnea on exertion.  She has difficulty walking to the mailbox.  Her recovery time is about 5 minutes.  There is no chest pain or ugly sputum production.  She has some clear rhinorrhea but this is not a new issue.  Her reflux is under very good control.  Allergies as of 01/29/2018      Reactions   Codeine Nausea And Vomiting   Sulfa Antibiotics Other (See Comments)   Allergic per pt's mother       Medication List      albuterol 108 (90 Base) MCG/ACT inhaler Commonly known as:  VENTOLIN HFA Inhale two puffs every 4-6 hours if needed for cough or wheeze   amLODipine 5 MG tablet Commonly known as:  NORVASC   atorvastatin 20 MG tablet Commonly known as:  LIPITOR Take 20 mg by mouth daily.   B-12 5000 MCG Caps Take by mouth daily.   beclomethasone 80 MCG/ACT inhaler Commonly known as:  QVAR REDIHALER Inhale two doses twice daily during flare-up.  Rinse, gargle, and spit after use.   citalopram 20 MG tablet Commonly known as:  CELEXA Take 20 mg by mouth daily.   dicyclomine 10 MG capsule Commonly known as:  BENTYL   DULERA 200-5 MCG/ACT Aero Generic drug:  mometasone-formoterol Inhale 2 puffs into the lungs 2  (two) times daily.   etodolac 500 MG tablet Commonly known as:  LODINE Take 500 mg by mouth 2 (two) times daily.   fluticasone 50 MCG/ACT nasal spray Commonly known as:  FLONASE USE ONE TO TWO SPRAY(S) IN EACH NOSTRIL ONCE DAILY FOR STUFFY NOSE OR DRAINAGE   Ibuprofen 200 MG Caps Take by mouth as needed.   ipratropium 0.06 % nasal spray Commonly known as:  ATROVENT Use two sprays in each nostril every 6 hours if needed for runny nose.   ipratropium-albuterol 0.5-2.5 (3) MG/3ML Soln Commonly known as:  DUONEB Take 3 mLs by nebulization every 4 (four) hours as needed.   losartan 100 MG tablet Commonly known as:  COZAAR Take 100 mg by mouth daily.   omalizumab 150 MG injection Commonly known as:  XOLAIR Inject into the skin.   omeprazole 40 MG capsule Commonly known as:  PRILOSEC TAKE 1 CAPSULE BY MOUTH TWICE DAILY   pioglitazone 30 MG tablet Commonly known as:  ACTOS Take 30 mg by mouth daily.   PROBIOTIC DAILY PO Take 1 capsule by mouth daily.   VICTOZA 18 MG/3ML Sopn Generic drug:  liraglutide Inject into the skin.   VITAMIN D PO Take by mouth daily.       Past Medical History:  Diagnosis Date  . Allergy   . Anxiety   . Cataract  bilateral and removed  . Depression   . Diaphragmatic hernia without mention of obstruction or gangrene   . DM (diabetes mellitus) ([place])   . Esophageal reflux   . Esophagitis, unspecified   . H/O adenomatous polyp of colon 2011  . Hyperlipidemia   . Incontinence of feces   . Irritable bowel syndrome   . Kidney stones   . Sarcoidosis   . Sleep apnea    no cpap now  . Status post dilation of esophageal narrowing   . Unspecified asthma(493.90)   . Unspecified essential hypertension   . Unspecified hearing loss     Past Surgical History:  Procedure Laterality Date  . BLADDER SURGERY    . CATARACT EXTRACTION Bilateral   . HARDWARE REMOVAL Left 01/12/2017   Procedure: HARDWARE REMOVAL left wrist;  Surgeon: Leanora Cover, MD;  Location: [facility];  Service: Orthopedics;  Laterality: Left;  . HEMORRHOID SURGERY    . NASAL SEPTUM SURGERY    . PARTIAL HYSTERECTOMY    . TONSILLECTOMY AND ADENOIDECTOMY    . TRIGGER FINGER RELEASE Left 05/12/2016   Procedure: RELEASE TRIGGER FINGER/A-1 PULLEY INFECTION LEFT RING TENDON SHEATH INJECT RIGHT INDEX FINGER;  Surgeon: Leanora Cover, MD;  Location: [facility];  Service: Orthopedics;  Laterality: Left;  . TUBAL LIGATION      Review of systems negative except as noted in HPI / PMHx or noted below:  Review of Systems  Constitutional: Negative.   HENT: Negative.   Eyes: Negative.   Respiratory: Negative.   Cardiovascular: Negative.   Gastrointestinal: Negative.   Genitourinary: Negative.   Musculoskeletal: Negative.   Skin: Negative.   Neurological: Negative.   Endo/Heme/Allergies: Negative.   Psychiatric/Behavioral: Negative.      Objective:   Vitals:   01/29/18 1346  BP: (!) 154/74  Pulse: 68  Resp: 16  SpO2: 96%          Physical Exam  Constitutional: She is well-developed, well-nourished, and in no distress.  HENT:  Head: Normocephalic.  Right Ear: External ear normal.  Left Ear: External ear normal.  Nose: Nose normal. No mucosal edema or rhinorrhea.  Mouth/Throat: Uvula is midline, oropharynx is clear and moist and mucous membranes are normal. No oropharyngeal exudate.  Hearing aids  Eyes: Conjunctivae are normal.  Neck: Trachea normal. No tracheal tenderness present. No tracheal deviation present. No thyromegaly present.  Cardiovascular: Normal rate, regular rhythm, S1 normal, S2 normal and normal heart sounds.  No murmur (systolic murmur) heard. Pulmonary/Chest: Breath sounds normal. No stridor. No respiratory distress. She has no wheezes. She has no rales.  Musculoskeletal: She exhibits no edema.  Lymphadenopathy:       Head (right side): No tonsillar adenopathy present.       Head (left side): No  tonsillar adenopathy present.    She has no cervical adenopathy.  Neurological: She is alert. Gait normal.  Skin: No rash noted. She is not diaphoretic. No erythema. Nails show no clubbing.  Psychiatric: Mood and affect normal.    Diagnostics:    Spirometry was performed and demonstrated an FEV1 of 0.86 at 46 % of predicted.  The patient had an Asthma Control Test with the following results: ACT Total Score: 8.    Oxygen saturation at rest on room air was 96%.  With walking up and down the hallway until she became short of breath her oxygen saturation dropped to 95% on room air.  An echocardiogram obtained 12 January 2018 identified mild  left atrial enlargement, normal left ventricular size with EF 53-29%, grade 1 diastolic dysfunction, mild aortic stenosis, mild aortic regurgitation, mild mitral and mild tricuspid regurgitation, estimated PA SP 49 mmHg  Assessment and Plan:   1. Asthma, not well controlled, severe persistent, with acute exacerbation   2. Other allergic rhinitis   3. LPRD (laryngopharyngeal reflux disease)   4. DOE (dyspnea on exertion)     1. Continue Xolair and Epi-Pen  2. Continue Dulera 200 two inhalations two times per day with spacer  3. Add SAMPLES of Qvar 80 REDIHALER two inhalations two times per day to [facility] during "flare up".  4. Continue Flonase - one spray each nostril 1-2 times per day  5. Continue omeprazole 40 twice a day + ranitidine 300 in evening  6. Continue nasal saline, ProAir HFA, Duoneb if needed.  7. Can add OTC Loratadine / Claritin 10 mg one tablet 1-2 times per day if needed  8. Can add OTC Mucinex DM 1-2 tablet 1-2 times per day if needed  9. Can add ipratropium 0.06% nasal spray - 1-2 sprays each nostril every 6 hours if needed  10. For this recent episode: Prednisone 10mg  - two tablets one time per day for 7 days only  11. Obtain a chest x-ray  12. Obtain blood - CBC w/diff  13. Obtain nocturnal oximetry  14. Return  to clinic in 4 weeks or earlier if problem  I think [name] needs to be further investigated for her dyspnea on exertion and we will obtain a CBC and a chest x-ray and nocturnal oximetry.  She will remain on a large collection of medical therapy directed against both inflammation and reflux as noted above.  Further evaluation and treatment will be based upon her response to treatment and the results of her diagnostic testing.  Allena Katz, MD Allergy / Immunology [place]

## 2018-01-30 ENCOUNTER — Telehealth: Payer: Self-pay | Admitting: *Deleted

## 2018-01-30 ENCOUNTER — Encounter: Payer: Self-pay | Admitting: Allergy and Immunology

## 2018-01-30 NOTE — Telephone Encounter (Signed)
Left a message for patient to call back. Per Dr. Neldon Mc, chest xray showed over inflated lungs and a small amount of fluid in her lungs.  Her blood work showed slight anemia but not a cause for breathing issues. We will wait to see what her Nocturnal O2 study shows before we come up with a plan. Paper copy of results will be scanned.

## 2018-01-30 NOTE — Telephone Encounter (Signed)
Patient informed of results.  

## 2018-01-31 ENCOUNTER — Other Ambulatory Visit: Payer: Self-pay | Admitting: *Deleted

## 2018-01-31 MED ORDER — FLUTICASONE PROPIONATE 50 MCG/ACT NA SUSP: NASAL | 5 refills | Status: DC

## 2018-02-05 DIAGNOSIS — R0902 Hypoxemia: Secondary | ICD-10-CM | POA: Diagnosis not present

## 2018-02-05 NOTE — Telephone Encounter (Signed)
Patient informed of normal O2 study results.

## 2018-02-13 DIAGNOSIS — J4540 Moderate persistent asthma, uncomplicated: Secondary | ICD-10-CM | POA: Diagnosis not present

## 2018-02-14 ENCOUNTER — Ambulatory Visit (INDEPENDENT_AMBULATORY_CARE_PROVIDER_SITE_OTHER): Payer: Medicare Other | Admitting: *Deleted

## 2018-02-14 ENCOUNTER — Other Ambulatory Visit (INDEPENDENT_AMBULATORY_CARE_PROVIDER_SITE_OTHER): Payer: Self-pay | Admitting: Orthopedic Surgery

## 2018-02-14 DIAGNOSIS — J4540 Moderate persistent asthma, uncomplicated: Secondary | ICD-10-CM

## 2018-02-14 DIAGNOSIS — M19012 Primary osteoarthritis, left shoulder: Secondary | ICD-10-CM

## 2018-02-15 ENCOUNTER — Other Ambulatory Visit (INDEPENDENT_AMBULATORY_CARE_PROVIDER_SITE_OTHER): Payer: Self-pay | Admitting: Orthopedic Surgery

## 2018-02-15 NOTE — Pre-Procedure Instructions (Signed)
TOBIN CADIENTE  02/15/2018      [id number] [address], [place] [address] Phone: [phone] Fax: [phone]    Your procedure is scheduled on 02-22-2018  Thursday   Report to [facility] Admitting at 10:00 A.M.   Call this number if you have problems the morning of surgery:  [phone]   Remember:  Do not eat food or drink liquids after midnight.   Take these medicines the morning of surgery with A SIP OF WATER   Tylenol if needed Albuterol inhaler if needed QVAR inhaler if needed Citalopram(Celexa) Diclomine(Bentyl)  Flonase nasal spray Atrovent nasal spray if needed Duoneb nebulizer if needed Loratadine(Claritin) Dulera inhaler Omeprazole(Prilosec) Tetrahydrozoline(Visine) eye drops if needed   STOP TAKING ANY ASPIRIN(UNLESS OTHERWISE INSTRUCTED BY YOUR SURGEON),ANTIINFLAMATORIES (IBUPROFEN,ALEVE,MOTRIN,ADVIL,GOODY'S POWDERS),HERBAL SUPPLEMENTS,FISH OIL,AND VITAMINS 5-7 DAYS PRIOR TO SURGERY       How to Manage Your Diabetes Before and After Surgery  Why is it important to control my blood sugar before and after surgery? . Improving blood sugar levels before and after surgery helps healing and can limit problems. . A way of improving blood sugar control is eating a healthy diet by: o  Eating less sugar and carbohydrates o  Increasing activity/exercise o  Talking with your doctor about reaching your blood sugar goals . High blood sugars (greater than 180 mg/dL) can raise your risk of infections and slow your recovery, so you will need to focus on controlling your diabetes during the weeks before surgery. . Make sure that the doctor who takes care of your diabetes knows about your planned surgery including the date and location.  How do I manage my blood sugar before surgery? . Check your blood sugar at least 4 times a day, starting 2 days before surgery, to make sure that the level  is not too high or low. o Check your blood sugar the morning of your surgery when you wake up and every 2 hours until you get to the Short Stay unit. . If your blood sugar is less than 70 mg/dL, you will need to treat for low blood sugar: o Do not take insulin. o Treat a low blood sugar (less than 70 mg/dL) with  cup of clear juice (cranberry or apple), 4 glucose tablets, OR glucose gel. Recheck blood sugar in 15 minutes after treatment (to make sure it is greater than 70 mg/dL). If your blood sugar is not greater than 70 mg/dL on recheck, call [phone] o  for further instructions. . Report your blood sugar to the short stay nurse when you get to Short Stay.  . If you are admitted to the hospital after surgery: o Your blood sugar will be checked by the staff and you will probably be given insulin after surgery (instead of oral diabetes medicines) to make sure you have good blood sugar levels. o The goal for blood sugar control after surgery is 80-180 mg/dL.              WHAT DO I DO ABOUT MY DIABETES MEDICATION?   Marland Kitchen Do not take oral diabetes medicines (pills) the morning of surgery.ACTOS  . .       . .  . The day of surgery, do not take other diabetes injectables, including Byetta (exenatide), Bydureon (exenatide ER), Victoza (liraglutide), or Trulicity (dulaglutide).  .   Other Instructions:            :  Reviewed and Endorsed by [facility] Patient Education Committee, August 2015     Do not wear jewelry, make-up or nail polish.  Do not wear lotions, powders, or perfumes, or deodorant.  Do not shave 48 hours prior to surgery.  Men may shave face and neck.  Do not bring valuables to the hospital.  [facility] is not responsible for any belongings or valuables.  Contacts, dentures or bridgework may not be worn into surgery.  Leave your suitcase in the car.  After surgery it may be brought to your room.  For patients admitted to the hospital, discharge  time will be determined by your treatment team.  Patients discharged the day of surgery will not be allowed to drive home.    Special Instructions: [place] - Preparing for Surgery  Before surgery, you can play an important role.  Because skin is not sterile, your skin needs to be as free of germs as possible.  You can reduce the number of germs on you skin by washing with CHG (chlorahexidine gluconate) soap before surgery.  CHG is an antiseptic cleaner which kills germs and bonds with the skin to continue killing germs even after washing.  Please DO NOT use if you have an allergy to CHG or antibacterial soaps.  If your skin becomes reddened/irritated stop using the CHG and inform your nurse when you arrive at Short Stay.  Do not shave (including legs and underarms) for at least 48 hours prior to the first CHG shower.  You may shave your face.  Please follow these instructions carefully:   1.  Shower with CHG Soap the night before surgery and the   morning of Surgery.  2.  If you choose to wash your hair, wash your hair first as usual with your normal shampoo.  3.  After you shampoo, rinse your hair and body thoroughly to remove the  Shampoo.  4.  Use CHG as you would any other liquid soap.  You can apply chg directly  to the skin and wash gently with scrungie or a clean washcloth.  5.  Apply the CHG Soap to your body ONLY FROM THE NECK DOWN.   Do not use on open wounds or open sores.  Avoid contact with your eyes,  ears, mouth and genitals (private parts).  Wash genitals (private parts) with your normal soap.  6.  Wash thoroughly, paying special attention to the area where your surgery will be performed.  7.  Thoroughly rinse your body with warm water from the neck down.  8.  DO NOT shower/wash with your normal soap after using and rinsing o  the CHG Soap.  9.  Pat yourself dry with a clean towel.            10.  Wear clean pajamas.            11.  Place clean sheets on your bed the  night of your first shower and do not sleep with pets.  Day of Surgery  Do not apply any lotions/deodorants the morning of surgery.  Please wear clean clothes to the hospital/surgery center.   Please read over the following fact sheets that you were given. MRSA Information and Surgical Site Infection Prevention

## 2018-02-16 ENCOUNTER — Encounter (HOSPITAL_COMMUNITY)
Admission: RE | Admit: 2018-02-16 | Discharge: 2018-02-16 | Disposition: A | Discharge status: Home / Self Care | Payer: Medicare Other | Source: Ambulatory Visit | Attending: Orthopedic Surgery | Admitting: Orthopedic Surgery

## 2018-02-16 ENCOUNTER — Other Ambulatory Visit: Payer: Self-pay

## 2018-02-16 ENCOUNTER — Encounter (HOSPITAL_COMMUNITY): Payer: Self-pay

## 2018-02-16 DIAGNOSIS — Z01812 Encounter for preprocedural laboratory examination: Secondary | ICD-10-CM | POA: Diagnosis not present

## 2018-02-16 DIAGNOSIS — G4733 Obstructive sleep apnea (adult) (pediatric): Secondary | ICD-10-CM | POA: Diagnosis not present

## 2018-02-16 DIAGNOSIS — J45909 Unspecified asthma, uncomplicated: Secondary | ICD-10-CM | POA: Insufficient documentation

## 2018-02-16 DIAGNOSIS — K219 Gastro-esophageal reflux disease without esophagitis: Secondary | ICD-10-CM | POA: Diagnosis not present

## 2018-02-16 DIAGNOSIS — E785 Hyperlipidemia, unspecified: Secondary | ICD-10-CM | POA: Insufficient documentation

## 2018-02-16 DIAGNOSIS — Z7982 Long term (current) use of aspirin: Secondary | ICD-10-CM | POA: Insufficient documentation

## 2018-02-16 DIAGNOSIS — I10 Essential (primary) hypertension: Secondary | ICD-10-CM | POA: Diagnosis not present

## 2018-02-16 DIAGNOSIS — Z79899 Other long term (current) drug therapy: Secondary | ICD-10-CM | POA: Diagnosis not present

## 2018-02-16 DIAGNOSIS — D869 Sarcoidosis, unspecified: Secondary | ICD-10-CM | POA: Insufficient documentation

## 2018-02-16 DIAGNOSIS — Z01810 Encounter for preprocedural cardiovascular examination: Secondary | ICD-10-CM | POA: Insufficient documentation

## 2018-02-16 HISTORY — DX: Dyspnea, unspecified: R06.00

## 2018-02-16 HISTORY — DX: Personal history of urinary calculi: Z87.442

## 2018-02-16 HISTORY — DX: Unspecified osteoarthritis, unspecified site: M19.90

## 2018-02-16 HISTORY — DX: Unspecified asthma, uncomplicated: J45.909

## 2018-02-16 HISTORY — DX: Cardiac murmur, unspecified: R01.1

## 2018-02-16 LAB — SURGICAL PCR SCREEN
MRSA, PCR: NEGATIVE
STAPHYLOCOCCUS AUREUS: NEGATIVE

## 2018-02-16 LAB — CBC
HEMATOCRIT: 36.3 % (ref 36.0–46.0)
Hemoglobin: 11.5 g/dL — ABNORMAL LOW (ref 12.0–15.0)
MCH: 29.6 pg (ref 26.0–34.0)
MCHC: 31.7 g/dL (ref 30.0–36.0)
MCV: 93.6 fL (ref 78.0–100.0)
PLATELETS: 249 10*3/uL (ref 150–400)
RBC: 3.88 MIL/uL (ref 3.87–5.11)
RDW: 14.3 % (ref 11.5–15.5)
WBC: 7.7 10*3/uL (ref 4.0–10.5)

## 2018-02-16 LAB — URINALYSIS, ROUTINE W REFLEX MICROSCOPIC
GLUCOSE, UA: NEGATIVE mg/dL
Ketones, ur: NEGATIVE mg/dL
NITRITE: NEGATIVE
Protein, ur: 30 mg/dL — AB
SPECIFIC GRAVITY, URINE: 1.028 (ref 1.005–1.030)
pH: 5 (ref 5.0–8.0)

## 2018-02-16 LAB — GLUCOSE, CAPILLARY: Glucose-Capillary: 201 mg/dL — ABNORMAL HIGH (ref 65–99)

## 2018-02-16 LAB — BASIC METABOLIC PANEL
Anion gap: 10 (ref 5–15)
BUN: 23 mg/dL — AB (ref 6–20)
CHLORIDE: 105 mmol/L (ref 101–111)
CO2: 22 mmol/L (ref 22–32)
CREATININE: 1.13 mg/dL — AB (ref 0.44–1.00)
Calcium: 9.1 mg/dL (ref 8.9–10.3)
GFR calc Af Amer: 55 mL/min — ABNORMAL LOW (ref >60)
GFR calc non Af Amer: 48 mL/min — ABNORMAL LOW (ref >60)
Glucose, Bld: 101 mg/dL — ABNORMAL HIGH (ref 65–99)
POTASSIUM: 4 mmol/L (ref 3.5–5.1)
Sodium: 137 mmol/L (ref 135–145)

## 2018-02-16 LAB — ABO/RH: ABO/RH(D): O POS

## 2018-02-16 LAB — TYPE AND SCREEN
ABO/RH(D): O POS
Antibody Screen: NEGATIVE

## 2018-02-16 LAB — HEMOGLOBIN A1C
Hgb A1c MFr Bld: 6 % — ABNORMAL HIGH (ref 4.8–5.6)
Mean Plasma Glucose: 125.5 mg/dL

## 2018-02-16 NOTE — Progress Notes (Signed)
IB message sent to Dr. Marlou Sa regarding results of UA

## 2018-02-17 LAB — URINE CULTURE

## 2018-02-19 ENCOUNTER — Other Ambulatory Visit (INDEPENDENT_AMBULATORY_CARE_PROVIDER_SITE_OTHER): Payer: Self-pay

## 2018-02-19 DIAGNOSIS — G8929 Other chronic pain: Secondary | ICD-10-CM

## 2018-02-19 DIAGNOSIS — M25512 Pain in left shoulder: Principal | ICD-10-CM

## 2018-02-19 NOTE — Progress Notes (Signed)
Anesthesia Chart Review:  Pt is a 72 year old female scheduled for L reverse shoulder arthroplasty on 02/22/2018 with Meredith Pel, MD  - PCP is Ann Held, MD - Allergist is Allena Katz, MD  PMH includes:  Heart murmur, HTN, hyperlipidemia, asthma, OSA, sarcoidosis, GERD. Never smoker. BMI 32  Medications include: albuterol, ASA 81mg , lipitor, duoneb, liraglutide, losartan, dulera, xolair, prilosec, pioglitazones  BP (!) 150/56   Pulse (!) 57   Temp 36.4 C   Resp 18   Ht 5\' 1"  (1.549 m)   Wt 169 lb 14.4 oz (77.1 kg)   SpO2 100%   BMI 32.10 kg/m   Preoperative labs reviewed.    EKG 02/16/18: Sinus bradycardia (58 bpm)  Echo 01/12/18:  1.  Mild LA enlargement. 2.  Normal LV size, thickness, and function.  EF 55-60%. 3.  Grade IAI diastolic dysfunction. 4.  Normal RV size and function. 5.  Mild aortic stenosis by Doppler, peak velocity 292 cm/s, mean gradient 23 mmHg. Mild aortic regurgitation 6.  Mild mitral and mild tricuspid regurgitation. 7.  Normal caliber IVC with >50% inspiratory collapse suggesting normal RA pressure.  Estimated PASP 49 mmHg, mild to moderately elevated. 8.  Trace pericardial effusion of no hemodynamic significance  If no changes, I anticipate pt can proceed with surgery as scheduled.   Willeen Cass, FNP-BC [facility] Short Stay Surgical Center/Anesthesiology Phone: [phone] 02/19/2018 12:36 PM

## 2018-02-19 NOTE — Progress Notes (Signed)
Urinalysis okay

## 2018-02-20 ENCOUNTER — Ambulatory Visit
Admission: RE | Admit: 2018-02-20 | Discharge: 2018-02-20 | Disposition: A | Discharge status: Home / Self Care | Payer: Medicare Other | Source: Ambulatory Visit | Attending: Orthopedic Surgery | Admitting: Orthopedic Surgery

## 2018-02-20 DIAGNOSIS — M25512 Pain in left shoulder: Principal | ICD-10-CM

## 2018-02-20 DIAGNOSIS — G8929 Other chronic pain: Secondary | ICD-10-CM

## 2018-02-20 DIAGNOSIS — M19012 Primary osteoarthritis, left shoulder: Secondary | ICD-10-CM | POA: Diagnosis not present

## 2018-02-22 ENCOUNTER — Encounter (HOSPITAL_COMMUNITY)
Admission: RE | Disposition: A | Discharge status: Home / Self Care | Payer: Self-pay | Source: Ambulatory Visit | Attending: Orthopedic Surgery

## 2018-02-22 ENCOUNTER — Inpatient Hospital Stay (HOSPITAL_COMMUNITY): Payer: Medicare Other | Admitting: Emergency Medicine

## 2018-02-22 ENCOUNTER — Inpatient Hospital Stay (HOSPITAL_COMMUNITY): Payer: Medicare Other | Admitting: Anesthesiology

## 2018-02-22 ENCOUNTER — Inpatient Hospital Stay (HOSPITAL_COMMUNITY): Payer: Medicare Other

## 2018-02-22 ENCOUNTER — Other Ambulatory Visit: Payer: Self-pay

## 2018-02-22 ENCOUNTER — Inpatient Hospital Stay (HOSPITAL_COMMUNITY)
Admission: RE | Admit: 2018-02-22 | Discharge: 2018-02-23 | DRG: 483 | Disposition: A | Discharge status: Home / Self Care | Payer: Medicare Other | Source: Ambulatory Visit | Attending: Orthopedic Surgery | Admitting: Orthopedic Surgery

## 2018-02-22 ENCOUNTER — Encounter (HOSPITAL_COMMUNITY): Payer: Self-pay | Admitting: Anesthesiology

## 2018-02-22 DIAGNOSIS — K589 Irritable bowel syndrome without diarrhea: Secondary | ICD-10-CM | POA: Diagnosis present

## 2018-02-22 DIAGNOSIS — Z79899 Other long term (current) drug therapy: Secondary | ICD-10-CM

## 2018-02-22 DIAGNOSIS — Z881 Allergy status to other antibiotic agents status: Secondary | ICD-10-CM

## 2018-02-22 DIAGNOSIS — I10 Essential (primary) hypertension: Secondary | ICD-10-CM | POA: Diagnosis present

## 2018-02-22 DIAGNOSIS — J449 Chronic obstructive pulmonary disease, unspecified: Secondary | ICD-10-CM | POA: Diagnosis present

## 2018-02-22 DIAGNOSIS — Z471 Aftercare following joint replacement surgery: Secondary | ICD-10-CM | POA: Diagnosis not present

## 2018-02-22 DIAGNOSIS — K219 Gastro-esophageal reflux disease without esophagitis: Secondary | ICD-10-CM | POA: Diagnosis present

## 2018-02-22 DIAGNOSIS — F329 Major depressive disorder, single episode, unspecified: Secondary | ICD-10-CM | POA: Diagnosis present

## 2018-02-22 DIAGNOSIS — E785 Hyperlipidemia, unspecified: Secondary | ICD-10-CM | POA: Diagnosis present

## 2018-02-22 DIAGNOSIS — M19019 Primary osteoarthritis, unspecified shoulder: Secondary | ICD-10-CM

## 2018-02-22 DIAGNOSIS — D869 Sarcoidosis, unspecified: Secondary | ICD-10-CM | POA: Diagnosis present

## 2018-02-22 DIAGNOSIS — Z885 Allergy status to narcotic agent status: Secondary | ICD-10-CM | POA: Diagnosis not present

## 2018-02-22 DIAGNOSIS — E119 Type 2 diabetes mellitus without complications: Secondary | ICD-10-CM | POA: Diagnosis present

## 2018-02-22 DIAGNOSIS — F419 Anxiety disorder, unspecified: Secondary | ICD-10-CM | POA: Diagnosis present

## 2018-02-22 DIAGNOSIS — Z7951 Long term (current) use of inhaled steroids: Secondary | ICD-10-CM

## 2018-02-22 DIAGNOSIS — Z90710 Acquired absence of both cervix and uterus: Secondary | ICD-10-CM | POA: Diagnosis not present

## 2018-02-22 DIAGNOSIS — M19012 Primary osteoarthritis, left shoulder: Principal | ICD-10-CM | POA: Diagnosis present

## 2018-02-22 DIAGNOSIS — M25512 Pain in left shoulder: Secondary | ICD-10-CM | POA: Diagnosis not present

## 2018-02-22 DIAGNOSIS — G4730 Sleep apnea, unspecified: Secondary | ICD-10-CM | POA: Diagnosis present

## 2018-02-22 DIAGNOSIS — G8918 Other acute postprocedural pain: Secondary | ICD-10-CM | POA: Diagnosis not present

## 2018-02-22 DIAGNOSIS — Z7982 Long term (current) use of aspirin: Secondary | ICD-10-CM

## 2018-02-22 DIAGNOSIS — Z96612 Presence of left artificial shoulder joint: Secondary | ICD-10-CM | POA: Diagnosis not present

## 2018-02-22 HISTORY — PX: REVERSE SHOULDER ARTHROPLASTY: SHX5054

## 2018-02-22 HISTORY — DX: Primary osteoarthritis, unspecified shoulder: M19.019

## 2018-02-22 LAB — GLUCOSE, CAPILLARY
GLUCOSE-CAPILLARY: 117 mg/dL — AB (ref 65–99)
Glucose-Capillary: 109 mg/dL — ABNORMAL HIGH (ref 65–99)
Glucose-Capillary: 135 mg/dL — ABNORMAL HIGH (ref 65–99)

## 2018-02-22 SURGERY — ARTHROPLASTY, SHOULDER, TOTAL, REVERSE
Anesthesia: Regional | Site: Shoulder | Laterality: Left

## 2018-02-22 MED ORDER — VANCOMYCIN HCL 500 MG IV SOLR
INTRAVENOUS | Status: DC | PRN
  Administered 2018-02-22: 500 mg via TOPICAL

## 2018-02-22 MED ORDER — FLUTICASONE PROPIONATE 50 MCG/ACT NA SUSP
1.0000 | Freq: Every day | NASAL | Status: DC
  Filled 2018-02-22: qty 16

## 2018-02-22 MED ORDER — FENTANYL CITRATE (PF) 100 MCG/2ML IJ SOLN
INTRAMUSCULAR | Status: AC
  Administered 2018-02-22: 50 ug via INTRAVENOUS
  Filled 2018-02-22: qty 2

## 2018-02-22 MED ORDER — ALBUTEROL SULFATE HFA 108 (90 BASE) MCG/ACT IN AERS: 2.0000 | INHALATION_SPRAY | RESPIRATORY_TRACT | Status: DC | PRN

## 2018-02-22 MED ORDER — LIRAGLUTIDE 18 MG/3ML ~~LOC~~ SOPN: 0.6000 mg | PEN_INJECTOR | Freq: Every day | SUBCUTANEOUS | Status: DC

## 2018-02-22 MED ORDER — HYDROMORPHONE HCL 1 MG/ML IJ SOLN: 0.5000 mg | INTRAMUSCULAR | Status: DC | PRN

## 2018-02-22 MED ORDER — ATORVASTATIN CALCIUM 20 MG PO TABS: 20.0000 mg | ORAL_TABLET | Freq: Every evening | ORAL | Status: DC

## 2018-02-22 MED ORDER — METHOCARBAMOL 500 MG PO TABS
500.0000 mg | ORAL_TABLET | Freq: Four times a day (QID) | ORAL | Status: DC | PRN
  Administered 2018-02-23: 500 mg via ORAL
  Filled 2018-02-22: qty 1

## 2018-02-22 MED ORDER — IPRATROPIUM-ALBUTEROL 0.5-2.5 (3) MG/3ML IN SOLN: 3.0000 mL | RESPIRATORY_TRACT | Status: DC | PRN

## 2018-02-22 MED ORDER — PANTOPRAZOLE SODIUM 40 MG PO TBEC
40.0000 mg | DELAYED_RELEASE_TABLET | Freq: Every day | ORAL | Status: DC
  Administered 2018-02-23: 40 mg via ORAL
  Filled 2018-02-22: qty 1

## 2018-02-22 MED ORDER — FENTANYL CITRATE (PF) 100 MCG/2ML IJ SOLN
INTRAMUSCULAR | Status: DC | PRN
  Administered 2018-02-22: 50 ug via INTRAVENOUS

## 2018-02-22 MED ORDER — METOCLOPRAMIDE HCL 5 MG PO TABS: 5.0000 mg | ORAL_TABLET | Freq: Three times a day (TID) | ORAL | Status: DC | PRN

## 2018-02-22 MED ORDER — ONDANSETRON HCL 4 MG/2ML IJ SOLN
INTRAMUSCULAR | Status: DC | PRN
  Administered 2018-02-22: 4 mg via INTRAVENOUS

## 2018-02-22 MED ORDER — MIDAZOLAM HCL 2 MG/2ML IJ SOLN
INTRAMUSCULAR | Status: AC
  Filled 2018-02-22: qty 2

## 2018-02-22 MED ORDER — DEXAMETHASONE SODIUM PHOSPHATE 4 MG/ML IJ SOLN
INTRAMUSCULAR | Status: DC | PRN
  Administered 2018-02-22: 2 mg via INTRAVENOUS

## 2018-02-22 MED ORDER — DICYCLOMINE HCL 10 MG PO CAPS: 10.0000 mg | ORAL_CAPSULE | Freq: Two times a day (BID) | ORAL | Status: DC

## 2018-02-22 MED ORDER — MOMETASONE FURO-FORMOTEROL FUM 200-5 MCG/ACT IN AERO
2.0000 | INHALATION_SPRAY | Freq: Two times a day (BID) | RESPIRATORY_TRACT | Status: DC
  Filled 2018-02-22 (×2): qty 8.8

## 2018-02-22 MED ORDER — PHENOL 1.4 % MT LIQD: 1.0000 | OROMUCOSAL | Status: DC | PRN

## 2018-02-22 MED ORDER — GABAPENTIN 300 MG PO CAPS
300.0000 mg | ORAL_CAPSULE | Freq: Three times a day (TID) | ORAL | Status: DC
  Administered 2018-02-22 – 2018-02-23 (×2): 300 mg via ORAL
  Filled 2018-02-22 (×2): qty 1

## 2018-02-22 MED ORDER — VITAMIN B-12 1000 MCG PO TABS
5000.0000 ug | ORAL_TABLET | Freq: Every day | ORAL | Status: DC
  Administered 2018-02-23: 5000 ug via ORAL
  Filled 2018-02-22: qty 5

## 2018-02-22 MED ORDER — TETRAHYDROZOLINE HCL 0.05 % OP SOLN: 1.0000 [drp] | Freq: Every day | OPHTHALMIC | Status: DC | PRN

## 2018-02-22 MED ORDER — VANCOMYCIN HCL 500 MG IV SOLR
INTRAVENOUS | Status: AC
  Filled 2018-02-22: qty 500

## 2018-02-22 MED ORDER — CEFAZOLIN SODIUM-DEXTROSE 2-4 GM/100ML-% IV SOLN
2.0000 g | INTRAVENOUS | Status: AC
  Administered 2018-02-22 (×2): 2 g via INTRAVENOUS
  Filled 2018-02-22: qty 100

## 2018-02-22 MED ORDER — FENTANYL CITRATE (PF) 100 MCG/2ML IJ SOLN: 25.0000 ug | INTRAMUSCULAR | Status: DC | PRN

## 2018-02-22 MED ORDER — LACTATED RINGERS IV SOLN
INTRAVENOUS | Status: DC
  Administered 2018-02-23: 75 mL/h via INTRAVENOUS

## 2018-02-22 MED ORDER — FENTANYL CITRATE (PF) 100 MCG/2ML IJ SOLN
50.0000 ug | Freq: Once | INTRAMUSCULAR | Status: AC
  Administered 2018-02-22: 50 ug via INTRAVENOUS

## 2018-02-22 MED ORDER — PROPOFOL 10 MG/ML IV BOLUS
INTRAVENOUS | Status: DC | PRN
  Administered 2018-02-22: 80 mg via INTRAVENOUS

## 2018-02-22 MED ORDER — IPRATROPIUM BROMIDE 0.06 % NA SOLN
2.0000 | Freq: Three times a day (TID) | NASAL | Status: DC
  Filled 2018-02-22: qty 15

## 2018-02-22 MED ORDER — CHLORHEXIDINE GLUCONATE 4 % EX LIQD: 60.0000 mL | Freq: Once | CUTANEOUS | Status: DC

## 2018-02-22 MED ORDER — ONDANSETRON HCL 4 MG/2ML IJ SOLN: 4.0000 mg | Freq: Four times a day (QID) | INTRAMUSCULAR | Status: DC | PRN

## 2018-02-22 MED ORDER — ONDANSETRON HCL 4 MG PO TABS: 4.0000 mg | ORAL_TABLET | Freq: Four times a day (QID) | ORAL | Status: DC | PRN

## 2018-02-22 MED ORDER — METHOCARBAMOL 1000 MG/10ML IJ SOLN
500.0000 mg | Freq: Four times a day (QID) | INTRAVENOUS | Status: DC | PRN
  Filled 2018-02-22: qty 5

## 2018-02-22 MED ORDER — ASPIRIN EC 81 MG PO TBEC
81.0000 mg | DELAYED_RELEASE_TABLET | Freq: Every day | ORAL | Status: DC
  Administered 2018-02-23: 81 mg via ORAL
  Filled 2018-02-22: qty 1

## 2018-02-22 MED ORDER — PHENYLEPHRINE HCL 10 MG/ML IJ SOLN
INTRAVENOUS | Status: DC | PRN
  Administered 2018-02-22: 25 ug/min via INTRAVENOUS

## 2018-02-22 MED ORDER — DOCUSATE SODIUM 100 MG PO CAPS
100.0000 mg | ORAL_CAPSULE | Freq: Two times a day (BID) | ORAL | Status: DC
  Administered 2018-02-22: 100 mg via ORAL
  Filled 2018-02-22 (×2): qty 1

## 2018-02-22 MED ORDER — LACTATED RINGERS IV SOLN
INTRAVENOUS | Status: DC
  Administered 2018-02-22 (×2): via INTRAVENOUS

## 2018-02-22 MED ORDER — VITAMIN D 1000 UNITS PO TABS
1000.0000 [IU] | ORAL_TABLET | Freq: Every day | ORAL | Status: DC
Start: 2018-02-22 — End: 2018-02-23
  Administered 2018-02-23: 1000 [IU] via ORAL
  Filled 2018-02-22: qty 1

## 2018-02-22 MED ORDER — CEFAZOLIN SODIUM-DEXTROSE 2-4 GM/100ML-% IV SOLN
2.0000 g | Freq: Four times a day (QID) | INTRAVENOUS | Status: AC
  Administered 2018-02-23: 2 g via INTRAVENOUS
  Filled 2018-02-22 (×2): qty 100

## 2018-02-22 MED ORDER — OMALIZUMAB 150 MG ~~LOC~~ SOLR: 150.0000 mg | SUBCUTANEOUS | Status: DC

## 2018-02-22 MED ORDER — OXYCODONE HCL 5 MG PO TABS
5.0000 mg | ORAL_TABLET | ORAL | Status: DC | PRN
  Administered 2018-02-23: 5 mg via ORAL
  Filled 2018-02-22: qty 1

## 2018-02-22 MED ORDER — BUPIVACAINE-EPINEPHRINE (PF) 0.5% -1:200000 IJ SOLN
INTRAMUSCULAR | Status: DC | PRN
  Administered 2018-02-22: 22 mL via PERINEURAL

## 2018-02-22 MED ORDER — SUCCINYLCHOLINE CHLORIDE 20 MG/ML IJ SOLN
INTRAMUSCULAR | Status: DC | PRN
  Administered 2018-02-22: 100 mg via INTRAVENOUS

## 2018-02-22 MED ORDER — 0.9 % SODIUM CHLORIDE (POUR BTL) OPTIME
TOPICAL | Status: DC | PRN
  Administered 2018-02-22: 5000 mL

## 2018-02-22 MED ORDER — PROPOFOL 10 MG/ML IV BOLUS
INTRAVENOUS | Status: AC
  Filled 2018-02-22: qty 20

## 2018-02-22 MED ORDER — MENTHOL 3 MG MT LOZG: 1.0000 | LOZENGE | OROMUCOSAL | Status: DC | PRN

## 2018-02-22 MED ORDER — FENTANYL CITRATE (PF) 250 MCG/5ML IJ SOLN
INTRAMUSCULAR | Status: AC
Start: 2018-02-22 — End: ?
  Filled 2018-02-22: qty 5

## 2018-02-22 MED ORDER — LOSARTAN POTASSIUM 50 MG PO TABS
100.0000 mg | ORAL_TABLET | Freq: Every day | ORAL | Status: DC
  Administered 2018-02-23: 100 mg via ORAL
  Filled 2018-02-22: qty 2

## 2018-02-22 MED ORDER — METOCLOPRAMIDE HCL 5 MG/ML IJ SOLN: 5.0000 mg | Freq: Three times a day (TID) | INTRAMUSCULAR | Status: DC | PRN

## 2018-02-22 MED ORDER — BUDESONIDE 0.25 MG/2ML IN SUSP: 0.2500 mg | Freq: Two times a day (BID) | RESPIRATORY_TRACT | Status: DC

## 2018-02-22 MED ORDER — CITALOPRAM HYDROBROMIDE 20 MG PO TABS
20.0000 mg | ORAL_TABLET | Freq: Every day | ORAL | Status: DC
  Administered 2018-02-23: 20 mg via ORAL
  Filled 2018-02-22: qty 1

## 2018-02-22 MED ORDER — PIOGLITAZONE HCL 30 MG PO TABS
30.0000 mg | ORAL_TABLET | Freq: Every day | ORAL | Status: DC
  Administered 2018-02-23: 30 mg via ORAL
  Filled 2018-02-22 (×2): qty 1

## 2018-02-22 MED ORDER — LORATADINE 10 MG PO TABS
10.0000 mg | ORAL_TABLET | Freq: Every day | ORAL | Status: DC
  Administered 2018-02-23: 10 mg via ORAL
  Filled 2018-02-22: qty 1

## 2018-02-22 SURGICAL SUPPLY — 75 items
ALCOHOL 70% 16 OZ (MISCELLANEOUS) ×2 IMPLANT
BLADE SAW SGTL 13X75X1.27 (BLADE) ×2 IMPLANT
BSPLAT GLND +2X24 MDLR (Joint) ×1 IMPLANT
CALIBRATOR GLENOID VIP 5-D (SYSTAGENIX WOUND MANAGEMENT) ×1 IMPLANT
COVER SURGICAL LIGHT HANDLE (MISCELLANEOUS) ×2 IMPLANT
CUP SUT UNIV REVERS 36+2 LEFT (Cup) ×1 IMPLANT
DRAPE INCISE IOBAN 66X45 STRL (DRAPES) ×4 IMPLANT
DRAPE U-SHAPE 47X51 STRL (DRAPES) ×4 IMPLANT
DRSG AQUACEL AG ADV 3.5X10 (GAUZE/BANDAGES/DRESSINGS) ×2 IMPLANT
DURAPREP 26ML APPLICATOR (WOUND CARE) ×2 IMPLANT
ELECT REM PT RETURN 9FT ADLT (ELECTROSURGICAL) ×2
ELECTRODE REM PT RTRN 9FT ADLT (ELECTROSURGICAL) ×1 IMPLANT
GAUZE SPONGE 4X4 12PLY STRL (GAUZE/BANDAGES/DRESSINGS) ×1 IMPLANT
GLENOID UNI REV MOD 24 +2 LAT (Joint) ×1 IMPLANT
GLENOSPHERE 33+4 LAT/24 UNI RV (Joint) ×1 IMPLANT
GLOVE BIOGEL PI IND STRL 7.5 (GLOVE) ×1 IMPLANT
GLOVE BIOGEL PI IND STRL 8 (GLOVE) ×1 IMPLANT
GLOVE BIOGEL PI INDICATOR 7.5 (GLOVE) ×1
GLOVE BIOGEL PI INDICATOR 8 (GLOVE) ×1
GLOVE ECLIPSE 7.0 STRL STRAW (GLOVE) ×2 IMPLANT
GLOVE SURG ORTHO 8.0 STRL STRW (GLOVE) ×2 IMPLANT
GLOVE SURG SS PI 8.0 STRL IVOR (GLOVE) ×2 IMPLANT
GOWN STRL REUS W/ TWL LRG LVL3 (GOWN DISPOSABLE) ×2 IMPLANT
GOWN STRL REUS W/ TWL XL LVL3 (GOWN DISPOSABLE) IMPLANT
GOWN STRL REUS W/TWL LRG LVL3 (GOWN DISPOSABLE) ×4
GOWN STRL REUS W/TWL XL LVL3 (GOWN DISPOSABLE) ×2
HANDPIECE INTERPULSE COAX TIP (DISPOSABLE) ×2
HYDROGEN PEROXIDE 16OZ (MISCELLANEOUS) ×2 IMPLANT
INSERT HUMERAL 36 +3/33 COMBO (Joint) ×1 IMPLANT
KIT BASIN OR (CUSTOM PROCEDURE TRAY) ×2 IMPLANT
KIT TURNOVER KIT B (KITS) ×2 IMPLANT
LOOP VESSEL MAXI BLUE (MISCELLANEOUS) ×1 IMPLANT
MANIFOLD NEPTUNE II (INSTRUMENTS) ×2 IMPLANT
NDL 1/2 CIR MAYO (NEEDLE) IMPLANT
NDL SUT 6 .5 CRC .975X.05 MAYO (NEEDLE) ×1 IMPLANT
NEEDLE 1/2 CIR MAYO (NEEDLE) ×2 IMPLANT
NEEDLE MAYO TAPER (NEEDLE) ×2
NS IRRIG 1000ML POUR BTL (IV SOLUTION) ×6 IMPLANT
PACK SHOULDER (CUSTOM PROCEDURE TRAY) ×2 IMPLANT
PAD ARMBOARD 7.5X6 YLW CONV (MISCELLANEOUS) ×4 IMPLANT
PASSER SUT SWANSON 36MM LOOP (INSTRUMENTS) ×2 IMPLANT
PIN SET MODULAR GLENOID SYSTEM (PIN) ×1 IMPLANT
SCREW CENTRAL MODULAR 20 (Screw) ×1 IMPLANT
SCREW PERI LOCK 5.5X16 (Screw) ×1 IMPLANT
SCREW PERI NL 4.5X32 (Screw) ×1 IMPLANT
SCREW PERIPHERAL 4.5X24 N/L (Screw) ×1 IMPLANT
SCREW PERIPHERAL 5.5X40 LOCK (Screw) ×1 IMPLANT
SCRUB BETADINE 4OZ XXX (MISCELLANEOUS) ×2 IMPLANT
SET HNDPC FAN SPRY TIP SCT (DISPOSABLE) ×1 IMPLANT
SLING ARM IMMOBILIZER LRG (SOFTGOODS) ×2 IMPLANT
SPONGE LAP 18X18 X RAY DECT (DISPOSABLE) ×2 IMPLANT
STEM HUMERAL UNI REVERS SZ6 (Stem) ×1 IMPLANT
STRIP CLOSURE SKIN 1/2X4 (GAUZE/BANDAGES/DRESSINGS) ×1 IMPLANT
SUCTION FRAZIER HANDLE 10FR (MISCELLANEOUS) ×1
SUCTION FRAZIER TIP 10 FR DISP (SUCTIONS) ×1 IMPLANT
SUCTION TUBE FRAZIER 10FR DISP (MISCELLANEOUS) ×1 IMPLANT
SUT FIBERWIRE #2 38 T-5 BLUE (SUTURE)
SUT MAXBRAID (SUTURE) IMPLANT
SUT MNCRL AB 3-0 PS2 18 (SUTURE) ×2 IMPLANT
SUT SILK 2 0 TIES 17X18 (SUTURE) ×2
SUT SILK 2-0 18XBRD TIE BLK (SUTURE) IMPLANT
SUT SILK 3 0 TIES 10X30 (SUTURE) ×2 IMPLANT
SUT VIC AB 0 CT1 27 (SUTURE) ×6
SUT VIC AB 0 CT1 27XBRD ANBCTR (SUTURE) ×2 IMPLANT
SUT VIC AB 1 CT1 27 (SUTURE) ×2
SUT VIC AB 1 CT1 27XBRD ANBCTR (SUTURE) ×1 IMPLANT
SUT VIC AB 2-0 CT1 27 (SUTURE) ×6
SUT VIC AB 2-0 CT1 TAPERPNT 27 (SUTURE) ×3 IMPLANT
SUT VICRYL 0 AB UR-6 (SUTURE) ×6 IMPLANT
SUTURE FIBERWR #2 38 T-5 BLUE (SUTURE) ×3 IMPLANT
SUTURE TAPE 1.3 40 TPR END (SUTURE) IMPLANT
SUTURETAPE 1.3 40 TPR END (SUTURE) ×6
TOWEL OR 17X26 10 PK STRL BLUE (TOWEL DISPOSABLE) ×2 IMPLANT
TRAY FOLEY BAG SILVER LF 16FR (CATHETERS) IMPLANT
WATER STERILE IRR 1000ML POUR (IV SOLUTION) ×2 IMPLANT

## 2018-02-22 NOTE — H&P (Signed)
[name] is an 72 y.o. female.   Chief Complaint: Left shoulder pain HPI: [name] is a 72 year old female with left shoulder pain.  She has known glenohumeral arthritis.  She is failed nonoperative measures including injections.  She presents now for operative treatment after explanation of risks and benefits.  She has night pain and rest pain and pain which interferes with her activities of daily living.  Past Medical History:  Diagnosis Date  . Allergy   . Anxiety   . Arthritis   . Asthma   . Cataract    bilateral and removed  . Depression   . Diaphragmatic hernia without mention of obstruction or gangrene   . DM (diabetes mellitus) ([place])   . Dyspnea   . Esophageal reflux   . Esophagitis, unspecified   . H/O adenomatous polyp of colon 2011  . Heart murmur    no cardiologist, family Dr. Maryfrances Bunnell  . History of kidney stones   . Hyperlipidemia   . Incontinence of feces   . Irritable bowel syndrome   . Kidney stones   . Sarcoidosis   . Sleep apnea    no cpap now  . Status post dilation of esophageal narrowing   . Unspecified asthma(493.90)   . Unspecified essential hypertension   . Unspecified hearing loss     Past Surgical History:  Procedure Laterality Date  . BLADDER SURGERY    . CATARACT EXTRACTION Bilateral   . HARDWARE REMOVAL Left 01/12/2017   Procedure: HARDWARE REMOVAL left wrist;  Surgeon: Leanora Cover, MD;  Location: [place];  Service: Orthopedics;  Laterality: Left;  . HEMORRHOID SURGERY    . NASAL SEPTUM SURGERY    . PARTIAL HYSTERECTOMY    . TONSILLECTOMY AND ADENOIDECTOMY    . TRIGGER FINGER RELEASE Left 05/12/2016   Procedure: RELEASE TRIGGER FINGER/A-1 PULLEY INFECTION LEFT RING TENDON SHEATH INJECT RIGHT INDEX FINGER;  Surgeon: Leanora Cover, MD;  Location: [facility];  Service: Orthopedics;  Laterality: Left;  . TUBAL LIGATION      Family History  Problem Relation Age of Onset  . Esophageal cancer Father   .  Diabetes Mother   . Diabetes Maternal Grandmother   . Colon cancer Neg Hx   . Rectal cancer Neg Hx   . Stomach cancer Neg Hx    Social History:  reports that she has never smoked. She has never used smokeless tobacco. She reports that she does not drink alcohol or use drugs.  Allergies:  Allergies  Allergen Reactions  . Codeine Nausea And Vomiting  . Sulfa Antibiotics Other (See Comments)    Allergic per pt's mother     Facility-Administered Medications Prior to Admission  Medication Dose Route Frequency Provider Last Rate Last Dose  . omalizumab Arvid Right) injection 150 mg  150 mg Subcutaneous Q28 days Jiles Prows, MD   150 mg at 02/14/18 1123   Medications Prior to Admission  Medication Sig Dispense Refill  . acetaminophen (TYLENOL) 500 MG tablet Take 500-1,000 mg by mouth 2 (two) times daily as needed.    Marland Kitchen albuterol (VENTOLIN HFA) 108 (90 Base) MCG/ACT inhaler Inhale two puffs every 4-6 hours if needed for cough or wheeze 1 Inhaler 1  . aspirin EC 81 MG tablet Take 81 mg by mouth daily.    Marland Kitchen atorvastatin (LIPITOR) 20 MG tablet Take 20 mg by mouth every evening.     . beclomethasone (QVAR REDIHALER) 80 MCG/ACT inhaler Inhale two doses twice daily during  flare-up.  Rinse, gargle, and spit after use. 1 Inhaler 0  . cholecalciferol (VITAMIN D) 1000 units tablet Take 1,000 Units by mouth daily.    . citalopram (CELEXA) 20 MG tablet Take 20 mg by mouth daily.      . Cyanocobalamin (VITAMIN B-12) 5000 MCG LOZG Take 5,000 mcg by mouth daily.    Marland Kitchen dicyclomine (BENTYL) 10 MG capsule Take 10 mg by mouth 2 (two) times daily.     . fluticasone (FLONASE) 50 MCG/ACT nasal spray Use one spray in each nostril once or twice daily (Patient taking differently: Place 1 spray into both nostrils daily. ) 16 g 5  . ipratropium (ATROVENT) 0.06 % nasal spray Use two sprays in each nostril every 6 hours if needed for runny nose. 15 mL 5  . liraglutide (VICTOZA) 18 MG/3ML SOPN Inject 0.6 mg into the skin  daily.     Marland Kitchen loratadine (CLARITIN) 10 MG tablet Take 10 mg by mouth daily.    Marland Kitchen losartan (COZAAR) 100 MG tablet Take 100 mg by mouth daily.    . mometasone-formoterol (DULERA) 200-5 MCG/ACT AERO Inhale 2 puffs into the lungs 2 (two) times daily.    . Multiple Vitamins-Minerals (ICAPS AREDS 2 PO) Take 1 capsule by mouth 2 (two) times daily.    Marland Kitchen omalizumab (XOLAIR) 150 MG injection Inject 150 mg into the skin every 28 (twenty-eight) days.     Marland Kitchen omeprazole (PRILOSEC) 40 MG capsule TAKE 1 CAPSULE BY MOUTH TWICE DAILY 60 capsule 3  . pioglitazone (ACTOS) 30 MG tablet Take 30 mg by mouth daily.    . Probiotic Product (PROBIOTIC DAILY PO) Take 1 capsule by mouth at bedtime.     . Tetrahydrozoline HCl (VISINE OP) Place 1 drop into both eyes daily as needed (dry eyes).    Marland Kitchen ipratropium-albuterol (DUONEB) 0.5-2.5 (3) MG/3ML SOLN Take 3 mLs by nebulization every 4 (four) hours as needed (shortness of breath).       Results for orders placed or performed during the hospital encounter of 02/22/18 (from the past 48 hour(s))  Glucose, capillary     Status: Abnormal   Collection Time: 02/22/18 10:09 AM  Result Value Ref Range   Glucose-Capillary 109 (H) 65 - 99 mg/dL  Glucose, capillary     Status: Abnormal   Collection Time: 02/22/18 12:06 PM  Result Value Ref Range   Glucose-Capillary 117 (H) 65 - 99 mg/dL   Comment 1 Notify RN    Comment 2 Document in Chart    No results found.  ROS All systems reviewed are negative as they relate to the chief complaint within the history of present illness.  Patient denies  fevers or chills.  Blood pressure (!) 170/68, pulse 89, temperature 97.8 F (36.6 C), temperature source Oral, resp. rate 15, height 5\' 1"  (1.549 m), weight 169 lb (76.7 kg), SpO2 100 %. Physical Exam   Constitutional: Patient appears well-developed HEENT:  Head: Normocephalic Eyes:EOM are normal Neck: Normal range of motion Cardiovascular: Normal rate Pulmonary/chest: Effort  normal Neurologic: Patient is alert Skin: Skin is warm Psychiatric: Patient has normal mood and affect  Examination of the left shoulder demonstrates forward flexion and abduction to about 90 degrees.  Coarse grinding and crepitus is present with passive range of motion of the left shoulder.  Motor sensory function to the left hand is intact.  Radial pulses intact.  Deltoid is functional.  Rotator cuff strength pretty reasonable to supraspinatus infraspinatus and subscap muscle motor testing.  No  masses lymphadenopathy or skin changes noted in the left shoulder girdle region  Assessment/Plan Impression is shoulder arthritis in a 72 year old patient.  Plan is reverse shoulder replacement.  Risk and benefits are discussed including but not limited to infection nerve vessel damage incomplete functional restoration.  Anticipate pain relief should be good.  Choosing to do reverse shoulder replacement due to probability of rotator cuff pathology developing in the next 10 years in this patient.  Patient understands the risk and benefits including but not limited to infection nerve vessel damage dislocation as well as potential need for more surgery.  All questions answered.  Anderson Malta, MD 02/22/2018, 12:39 PM

## 2018-02-22 NOTE — Anesthesia Procedure Notes (Signed)
Procedure Name: Intubation Date/Time: 02/22/2018 1:26 PM Performed by: Jenne Campus, CRNA Pre-anesthesia Checklist: Patient identified, Emergency Drugs available, Suction available and Patient being monitored Patient Re-evaluated:Patient Re-evaluated prior to induction Oxygen Delivery Method: Circle System Utilized Preoxygenation: Pre-oxygenation with 100% oxygen Induction Type: IV induction Ventilation: Mask ventilation without difficulty Laryngoscope Size: Glidescope and 3 Grade View: Grade II Tube type: Oral Tube size: 7.0 mm Number of attempts: 1 Airway Equipment and Method: Stylet,  Oral airway and Video-laryngoscopy Placement Confirmation: ETT inserted through vocal cords under direct vision,  positive ETCO2 and breath sounds checked- equal and bilateral Secured at: 20 cm Tube secured with: Tape Dental Injury: Teeth and Oropharynx as per pre-operative assessment  Difficulty Due To: Difficulty was anticipated, Difficult Airway- due to reduced neck mobility, Difficult Airway- due to anterior larynx, Difficult Airway- due to limited oral opening and Difficult Airway- due to dentition Future Recommendations: Recommend- induction with short-acting agent, and alternative techniques readily available Comments: Elective video-glide.

## 2018-02-22 NOTE — Anesthesia Procedure Notes (Signed)
Anesthesia Regional Block: Interscalene brachial plexus block   Pre-Anesthetic Checklist: ,, timeout performed, Correct Patient, Correct Site, Correct Laterality, Correct Procedure, Correct Position, site marked, Risks and benefits discussed, pre-op evaluation,  At surgeon's request and post-op pain management  Laterality: Left  Prep: Maximum Sterile Barrier Precautions used, chloraprep       Needles:  Injection technique: Single-shot  Needle Type: Echogenic Stimulator Needle     Needle Length: 5cm  Needle Gauge: 22     Additional Needles:   Procedures:,,,, ultrasound used (permanent image in chart),,,,  Narrative:  Start time: 02/22/2018 11:08 AM End time: 02/22/2018 11:18 AM Injection made incrementally with aspirations every 5 mL. Anesthesiologist: Roderic Palau, MD  Additional Notes: 2% Lidocaine skin wheel.

## 2018-02-22 NOTE — Brief Op Note (Signed)
02/22/2018  4:58 PM  PATIENT:  [name]  72 y.o. female  PRE-OPERATIVE DIAGNOSIS:  Left Shoulder Osteoarthritis  POST-OPERATIVE DIAGNOSIS:  Left Shoulder Osteoarthritis  PROCEDURE:  Procedure(s): LEFT REVERSE SHOULDER ARTHROPLASTY  SURGEON:  Surgeon(s): Marlou Sa, Tonna Corner, MD  ASSISTANT: Laure Kidney rnfa  ANESTHESIA:   general  EBL: 150 ml    Total I/O In: 1000 [I.V.:1000] Out: 150 [Blood:150]  BLOOD ADMINISTERED: none  DRAINS: none   LOCAL MEDICATIONS USED:  none  SPECIMEN:  No Specimen  COUNTS:  YES  TOURNIQUET:  * No tourniquets in log *  DICTATION: .Other Dictation: Dictation Number [id number]  PLAN OF CARE: Admit to inpatient   PATIENT DISPOSITION:  PACU - hemodynamically stable

## 2018-02-22 NOTE — Transfer of Care (Signed)
Immediate Anesthesia Transfer of Care Note  Patient: [name]  Procedure(s) Performed: LEFT REVERSE SHOULDER ARTHROPLASTY (Left Shoulder)  Patient Location: PACU  Anesthesia Type:General  Level of Consciousness: awake  Airway & Oxygen Therapy: Patient Spontanous Breathing  Post-op Assessment: Report given to RN and Post -op Vital signs reviewed and stable  Post vital signs: Reviewed and stable  Last Vitals:  Vitals Value Taken Time  BP 136/59 02/22/2018  4:58 PM  Temp    Pulse 71 02/22/2018  5:05 PM  Resp 18 02/22/2018  5:05 PM  SpO2 99 % 02/22/2018  5:05 PM  Vitals shown include unvalidated device data.  Last Pain:  Vitals:   02/22/18 1131  TempSrc:   PainSc: 0-No pain         Complications: No apparent anesthesia complications

## 2018-02-22 NOTE — Op Note (Signed)
[name], [name]              ACCOUNT NO.:  [id number]  MEDICAL RECORD NO.:  [id number]  LOCATION:  [facility]                         FACILITY:  [facility]  PHYSICIAN:  Anderson Malta, M.D.    DATE OF BIRTH:  1946-05-10  DATE OF PROCEDURE: DATE OF DISCHARGE:                              OPERATIVE REPORT   PREOPERATIVE DIAGNOSIS:  Left shoulder arthritis.  POSTOPERATIVE DIAGNOSIS:  Left shoulder arthritis.  PROCEDURE:  Left shoulder reverse total shoulder replacement using Arthrex components, 33 glenosphere, small base plate, +4 lateralization with size 6 stem and +3 liner.  SURGEON:  Anderson Malta, M.D.  ASSISTANT:  Laure Kidney, RNFA.  INDICATIONS:  [name] is a 72 year old patient with end-stage arthritis in the left shoulder, presents now for operative management after explanation of risks and benefits.  PROCEDURE IN DETAIL:  The patient was brought to the operating room where general endotracheal anesthesia was introduced.  Preoperative antibiotics were administered.  Time-out was called.  Left shoulder was prescrubbed with hydrogen peroxide, alcohol and Betadine, allowed to air dry, prepped with DuraPrep solution and draped in a sterile manner. [name] was used to cover the entire operative field.  The patient's head was in neutral position.  Deltopectoral approach was made.  Skin and subcutaneous tissue were sharply divided.  Cephalic vein was mobilized medially, but required ligation because of multiple perforating branches.  Deltoid was released, anterior aspect.  The subdeltoid space and subacromial space were also cleared.  At this time, the biceps tendon was tenodesed to the pectoralis major, which was released at its superior 1 cm.  The coracohumeral ligament and rotator interval were fully released and open.  The circumflex vessels were then ligated with silk sutures.  Axillary nerve was then visualized.  A vessel loop was placed around it and it was protected at all  times during the remaining portion of the case.  The subscapularis was attached in the lesser tuberosity and the capsular detachment was performed about 2 cm below the inferior neck of the humerus.  This was taken around to about the 7 o'clock position on the left humerus.  At this time, [name] was placed.  The humeral head was entered and the neck was cut in approximately 20 degrees of retroversion and this was in accordance with the patient's native version.  Appropriate depth cut was made to facilitate visualization.  At this time, a cap was placed on the cut humeral neck.  The posterior retractor was placed.  Circumferentially, the labrum was resected from the glenoid.  Anterior retractor placed. The capsule was divided with my index finger on top of the axillary nerve.  This allowed for posterior displacement of the humerus.  Triceps was also partially released off the undersurface of the glenoid and scapula.  Following this, the guidepin was placed in accordance with preoperative templating and the guide that was created.  Reaming was performed.  The patient had a very small glenoid.  Would require a 33-mm glenosphere.  Reaming was then performed primarily anterior and inferior.  Not much glenoid deformity was present.  At this time, the glenoid base plate was placed with reasonable fixation of the central screw, but excellent  fixation on the four peripheral screws.  Two nonlocking at the 12 o'clock and 6 o'clock position were used, two locking screws at the 3 o'clock and 9 o'clock position were used.  Good stable fixation was achieved.  Glenosphere was placed with good Morse taper achieved as well.  Attention was then directed towards the humerus.  The humerus was reamed and broached up to size 7.  This gave very good fit.  With the trial in position, size 3 spacer was placed, it was difficult to reduce, but the patient had good stability particularly with adduction,  shoulder extension and lateral force applied.  Shoulder was dislocated.  True components placed.  The subscapularis could be repaired with minimal tension at 30 degrees of external rotation and it was done through drill holes.  These drill holes were placed prior to placing the true component.  At this time, the subscap was repaired. Rotator interval closed with the arm in 30 degrees of external rotation. Good stability of the components was achieved.  Thorough irrigation was performed.  Vancomycin powder placed within the incision.  There, at this time, deltopectoral split was closed using #1 Vicryl suture followed by interrupted inverted 0 Vicryl suture, 2-0 Vicryl suture, and a 3-0 Monocryl.  Aquacel dressing placed along with a shoulder immobilizer.  The patient tolerated the procedure well without immediate complications, transferred to the recovery room in stable condition.     Anderson Malta, M.D.     GSD/MEDQ  D:  02/22/2018  T:  02/22/2018  Job:  [id number]

## 2018-02-22 NOTE — Anesthesia Preprocedure Evaluation (Addendum)
Anesthesia Evaluation  Patient identified by MRN, date of birth, ID band Patient awake    Reviewed: Allergy & Precautions, H&P , NPO status , Patient's Chart, lab work & pertinent test results  Airway Mallampati: IV  TM Distance: >3 FB Neck ROM: Full  Mouth opening: Limited Mouth Opening  Dental no notable dental hx. (+) Teeth Intact, Dental Advisory Given   Pulmonary shortness of breath and with exertion, asthma , sleep apnea , COPD,  COPD inhaler,    Pulmonary exam normal breath sounds clear to auscultation       Cardiovascular hypertension, Pt. on medications + Valvular Problems/Murmurs AS  Rhythm:Regular Rate:Normal     Neuro/Psych PSYCHIATRIC DISORDERS Anxiety Depression negative neurological ROS     GI/Hepatic Neg liver ROS, GERD  Medicated and Controlled,  Endo/Other  diabetes, Type 2, Oral Hypoglycemic Agents  Renal/GU negative Renal ROS  negative genitourinary   Musculoskeletal  (+) Arthritis , Osteoarthritis,    Abdominal   Peds  Hematology negative hematology ROS (+)   Anesthesia Other Findings   Reproductive/Obstetrics negative OB ROS                           Anesthesia Physical Anesthesia Plan  ASA: III  Anesthesia Plan: General   Post-op Pain Management:  Regional for Post-op pain   Induction: Intravenous  PONV Risk Score and Plan: 3 and Ondansetron, Dexamethasone and Treatment may vary due to age or medical condition  Airway Management Planned: Oral ETT and Video Laryngoscope Planned  Additional Equipment:   Intra-op Plan:   Post-operative Plan: Extubation in OR  Informed Consent: I have reviewed the patients History and Physical, chart, labs and discussed the procedure including the risks, benefits and alternatives for the proposed anesthesia with the patient or authorized representative who has indicated his/her understanding and acceptance.   Dental advisory  given  Plan Discussed with: CRNA  Anesthesia Plan Comments:        Anesthesia Quick Evaluation

## 2018-02-23 ENCOUNTER — Encounter (HOSPITAL_COMMUNITY): Payer: Self-pay | Admitting: General Practice

## 2018-02-23 ENCOUNTER — Other Ambulatory Visit: Payer: Self-pay

## 2018-02-23 NOTE — Progress Notes (Signed)
Subjective: Patient doing well.  Pain control because block has not yet worn off   Objective: Vital signs in last 24 hours: Temp:  [97.7 F (36.5 C)-98.7 F (37.1 C)] 98.3 F (36.8 C) (03/29 0555) Pulse Rate:  [65-89] 65 (03/29 0555) Resp:  [11-20] 17 (03/29 0555) BP: (99-170)/(48-81) 141/62 (03/28 2143) SpO2:  [89 %-100 %] 94 % (03/29 0555) Weight:  [169 lb (76.7 kg)] 169 lb (76.7 kg) (03/28 1005)  Intake/Output from previous day: 03/28 0701 - 03/29 0700 In: 2600 [I.V.:2400; IV Piggyback:200] Out: 850 [Urine:700; Blood:150] Intake/Output this shift: No intake/output data recorded.  Exam:  Radial pulse intact.  Block not yet worn off  Labs: No results for input(s): HGB in the last 72 hours. No results for input(s): WBC, RBC, HCT, PLT in the last 72 hours. No results for input(s): NA, K, CL, CO2, BUN, CREATININE, GLUCOSE, CALCIUM in the last 72 hours. No results for input(s): LABPT, INR in the last 72 hours.  Assessment/Plan: I would like for her to undergo occupational therapy this morning.  This would be primarily for passive range of motion.  We will see how she does with her pain once the block wears off.  Could discharge home this afternoon or tomorrow depending on that.  Plan at this time is to see how her pain control is.   Landry Dyke Dean 02/23/2018, 8:36 AM

## 2018-02-23 NOTE — Progress Notes (Signed)
PT Cancellation Note  Patient Details Name: [name] MRN: [id number] DOB: 01-14-1946   Cancelled Treatment:    Reason Eval/Treat Not Completed: PT screened, no needs identified, will sign off Per OT, no need for physical therapy eval. Spoke to patient who agrees.   Reinaldo Berber, PT, DPT Acute Rehab Services Pager: [phone]     Reinaldo Berber 02/23/2018, 2:39 PM

## 2018-02-23 NOTE — Progress Notes (Signed)
Called Dr. Marlou Sa to report pt and husband requiring if discharge plan was for this afternoon. Pain is controlled with po medication pt will have home pain meds upon discharge. Dr. Marlou Sa stated she will be good to go home. Will inform pt and husband

## 2018-02-23 NOTE — Evaluation (Signed)
Occupational Therapy Evaluation Patient Details Name: [name] MRN: [id number] DOB: Oct 20, 1946 Today's Date: 02/23/2018    History of Present Illness Left total shoulder replacement.  72 year old patient with end-stage arthritis in left shoulder   Clinical Impression   Pt presents to OT with reports of block starting to wear off and reports pain 9/10 however premedicated. Pt completed bed mobility and toilet transfers without assist. Engaged in LB bathing with setup post toileting.  Provided pt with shoulder protocol handout and exercises per MD orders and therapist instructed pt in each line item.  Engaged in shoulder flexion to 90*, external rotation to neutral, and abduction to 60* with pt able to complete as instructed, unable to reach full shoulder flexion or abduction at this time due to pain.  Pt able to doff and don Rt shoulder sling with assist from husband to ensure proper fit.  Pt reports husband will be present to assist with self-care tasks and exercises as needed.  Pt and husband report understanding of education and exercises, therefore recommend d/c and follow surgeon's recommendation for follow up therapies.    Follow Up Recommendations  Follow surgeon's recommendation for DC plan and follow-up therapies    Equipment Recommendations  None recommended by OT    Recommendations for Other Services       Precautions / Restrictions Precautions Precautions: Shoulder Type of Shoulder Precautions: Left total shoulder replacement Shoulder Interventions: Shoulder sling/immobilizer Precaution Booklet Issued: Yes (comment) Precaution Comments: provided pt with shoulder protocol and exercise handout Restrictions Weight Bearing Restrictions: Yes RUE Weight Bearing: Non weight bearing      Mobility Bed Mobility Overal bed mobility: Independent                Transfers Overall transfer level: Independent Equipment used: None                      ADL  either performed or assessed with clinical judgement   ADL                                         General ADL Comments: see shoulder section                  Pertinent Vitals/Pain Pain Assessment: 0-10 Pain Score: 9  Pain Descriptors / Indicators: Aching;Grimacing;Guarding Pain Intervention(s): Limited activity within patient's tolerance;Monitored during session;Premedicated before session;Repositioned     Hand Dominance     Extremity/Trunk Assessment Upper Extremity Assessment Upper Extremity Assessment: LUE deficits/detail(total shoulder replacement)   Lower Extremity Assessment Lower Extremity Assessment: Overall WFL for tasks assessed       Communication Communication Communication: No difficulties   Cognition Arousal/Alertness: Awake/alert Behavior During Therapy: WFL for tasks assessed/performed Overall Cognitive Status: Within Functional Limits for tasks assessed                                        Exercises Exercises: Shoulder Shoulder Exercises Pendulum Exercise: AAROM;Left;Seated Shoulder Flexion: AAROM;PROM;Seated;10 reps;Left Shoulder ABduction: PROM;AAROM;Left;10 reps;Seated Shoulder External Rotation: PROM;AAROM;10 reps;Left;Seated Elbow Flexion: AROM;Left;10 reps;Seated Elbow Extension: AROM;Left;Seated;10 reps Wrist Flexion: AROM;Left;10 reps;Seated Wrist Extension: AROM;Left;10 reps;Seated Digit Composite Flexion: AROM;Left;Seated Neck Flexion: AROM;Seated Neck Extension: AROM;Seated Neck Lateral Flexion - Right: AROM;Seated Neck Lateral Flexion - Left: AROM;Seated   Shoulder Instructions Shoulder Instructions Donning/doffing shirt  without moving shoulder: Maximal assistance Method for sponge bathing under operated UE: Minimal assistance Donning/doffing sling/immobilizer: Moderate assistance Correct positioning of sling/immobilizer: Moderate assistance Pendulum exercises (written home exercise program):  Minimal assistance;Caregiver independent with task ROM for elbow, wrist and digits of operated UE: Supervision/safety Sling wearing schedule (on at all times/off for ADL's): Supervision/safety Proper positioning of operated UE when showering: Supervision/safety Positioning of UE while sleeping: Supervision/safety;Caregiver independent with task    Home Living Family/patient expects to be discharged to:: Private residence Living Arrangements: Spouse/significant other Available Help at Discharge: Family Type of Home: House                                  Prior Functioning/Environment Level of Independence: Independent                 OT Problem List: Decreased range of motion;Decreased knowledge of precautions;Impaired UE functional use;Pain         OT Goals(Current goals can be found in the care plan section) Acute Rehab OT Goals OT Goal Formulation: All assessment and education complete, DC therapy   AM-PAC PT "6 Clicks" Daily Activity     Outcome Measure Help from another person eating meals?: A Little Help from another person taking care of personal grooming?: None Help from another person toileting, which includes using toliet, bedpan, or urinal?: A Little Help from another person bathing (including washing, rinsing, drying)?: A Little Help from another person to put on and taking off regular upper body clothing?: A Lot Help from another person to put on and taking off regular lower body clothing?: A Lot 6 Click Score: 17   End of Session Equipment Utilized During Treatment: (shoulder sling) Nurse Communication: Mobility status  Activity Tolerance: Patient tolerated treatment well Patient left: in bed;with call bell/phone within reach;with family/visitor present  OT Visit Diagnosis: Pain Pain - Right/Left: Left Pain - part of body: Shoulder                Time: 5631-4970 OT Time Calculation (min): 47 min Charges:  OT General Charges $OT Visit: 1  Visit OT Evaluation $OT Eval Moderate Complexity: 1 Mod OT Treatments $Self Care/Home Management : 8-22 mins $Therapeutic Exercise: 8-22 mins   Simonne Come, [place] 02/23/2018, 12:06 PM

## 2018-02-25 DIAGNOSIS — I10 Essential (primary) hypertension: Secondary | ICD-10-CM | POA: Diagnosis not present

## 2018-02-25 DIAGNOSIS — Z471 Aftercare following joint replacement surgery: Secondary | ICD-10-CM | POA: Diagnosis not present

## 2018-02-25 DIAGNOSIS — K449 Diaphragmatic hernia without obstruction or gangrene: Secondary | ICD-10-CM | POA: Diagnosis not present

## 2018-02-25 DIAGNOSIS — E119 Type 2 diabetes mellitus without complications: Secondary | ICD-10-CM | POA: Diagnosis not present

## 2018-02-25 DIAGNOSIS — H9190 Unspecified hearing loss, unspecified ear: Secondary | ICD-10-CM | POA: Diagnosis not present

## 2018-02-25 DIAGNOSIS — J45909 Unspecified asthma, uncomplicated: Secondary | ICD-10-CM | POA: Diagnosis not present

## 2018-02-26 ENCOUNTER — Telehealth (INDEPENDENT_AMBULATORY_CARE_PROVIDER_SITE_OTHER): Payer: Self-pay | Admitting: *Deleted

## 2018-02-26 NOTE — Telephone Encounter (Signed)
Ok for order?  

## 2018-02-26 NOTE — Telephone Encounter (Signed)
I called [name] and advised.

## 2018-02-26 NOTE — Telephone Encounter (Signed)
Received call from [place] at [facility] requesting VO for pt to PT of Left shoulder x2/week for 8 weeks. Please advise.   CB: [phone]

## 2018-02-26 NOTE — Telephone Encounter (Signed)
Y pls calla thx

## 2018-02-27 DIAGNOSIS — J45909 Unspecified asthma, uncomplicated: Secondary | ICD-10-CM | POA: Diagnosis not present

## 2018-02-27 DIAGNOSIS — I10 Essential (primary) hypertension: Secondary | ICD-10-CM | POA: Diagnosis not present

## 2018-02-27 DIAGNOSIS — E119 Type 2 diabetes mellitus without complications: Secondary | ICD-10-CM | POA: Diagnosis not present

## 2018-02-27 DIAGNOSIS — K449 Diaphragmatic hernia without obstruction or gangrene: Secondary | ICD-10-CM | POA: Diagnosis not present

## 2018-02-27 DIAGNOSIS — H9190 Unspecified hearing loss, unspecified ear: Secondary | ICD-10-CM | POA: Diagnosis not present

## 2018-02-27 DIAGNOSIS — Z471 Aftercare following joint replacement surgery: Secondary | ICD-10-CM | POA: Diagnosis not present

## 2018-02-28 ENCOUNTER — Telehealth (INDEPENDENT_AMBULATORY_CARE_PROVIDER_SITE_OTHER): Payer: Self-pay | Admitting: Orthopedic Surgery

## 2018-02-28 DIAGNOSIS — E119 Type 2 diabetes mellitus without complications: Secondary | ICD-10-CM | POA: Diagnosis not present

## 2018-02-28 DIAGNOSIS — K449 Diaphragmatic hernia without obstruction or gangrene: Secondary | ICD-10-CM | POA: Diagnosis not present

## 2018-02-28 DIAGNOSIS — H9190 Unspecified hearing loss, unspecified ear: Secondary | ICD-10-CM | POA: Diagnosis not present

## 2018-02-28 DIAGNOSIS — J45909 Unspecified asthma, uncomplicated: Secondary | ICD-10-CM | POA: Diagnosis not present

## 2018-02-28 DIAGNOSIS — I10 Essential (primary) hypertension: Secondary | ICD-10-CM | POA: Diagnosis not present

## 2018-02-28 DIAGNOSIS — Z471 Aftercare following joint replacement surgery: Secondary | ICD-10-CM | POA: Diagnosis not present

## 2018-02-28 NOTE — Telephone Encounter (Signed)
Geny from [facility] called asking for verbal approval on OT for 2 week 4 for ADL/IADL training, home safety, and home exercise program. CB # [phone]

## 2018-03-01 ENCOUNTER — Telehealth (INDEPENDENT_AMBULATORY_CARE_PROVIDER_SITE_OTHER): Payer: Self-pay

## 2018-03-01 ENCOUNTER — Encounter (HOSPITAL_COMMUNITY): Payer: Self-pay | Admitting: Orthopedic Surgery

## 2018-03-01 DIAGNOSIS — E119 Type 2 diabetes mellitus without complications: Secondary | ICD-10-CM | POA: Diagnosis not present

## 2018-03-01 DIAGNOSIS — I10 Essential (primary) hypertension: Secondary | ICD-10-CM | POA: Diagnosis not present

## 2018-03-01 DIAGNOSIS — Z471 Aftercare following joint replacement surgery: Secondary | ICD-10-CM | POA: Diagnosis not present

## 2018-03-01 DIAGNOSIS — K449 Diaphragmatic hernia without obstruction or gangrene: Secondary | ICD-10-CM | POA: Diagnosis not present

## 2018-03-01 DIAGNOSIS — J45909 Unspecified asthma, uncomplicated: Secondary | ICD-10-CM | POA: Diagnosis not present

## 2018-03-01 DIAGNOSIS — H9190 Unspecified hearing loss, unspecified ear: Secondary | ICD-10-CM | POA: Diagnosis not present

## 2018-03-01 NOTE — Telephone Encounter (Signed)
Tried calling, no answer. No VM to LM.

## 2018-03-01 NOTE — Telephone Encounter (Signed)
Patient called stating that is constipated and would like to know if there is something that can be done or would she need a Rx? S/P left shoulder surgery on 02/22/18.  Cb# is [phone].  Please advise.  Thank you.

## 2018-03-01 NOTE — Telephone Encounter (Signed)
IC Verbal given.

## 2018-03-01 NOTE — Discharge Summary (Signed)
Physician Discharge Summary  Patient ID: [name] MRN: [id number] DOB/AGE: 07/27/1946 72 y.o.  Admit date: 02/22/2018 Discharge date: 02/23/2018  Admission Diagnoses:  Active Problems:   Shoulder arthritis   Discharge Diagnoses:  Same  Surgeries: Procedure(s): LEFT REVERSE SHOULDER ARTHROPLASTY on 02/22/2018   Consultants:   Discharged Condition: Stable  Hospital Course: [name] is an 72 y.o. female who was admitted 02/22/2018 with a chief complaint of left shoulder pain, and found to have a diagnosis of left shoulder arthritis.  They were brought to the operating room on 02/22/2018 and underwent the above named procedures.  Patient tolerated the procedure well without immediate complications.  She was seen by occupational therapy on postop day #1.  Pain was controlled at that time.  She is discharged to home in good condition.  She will follow-up with me in 2 weeks for clinical recheck.  Continue with home health PT as well as shoulder CPM machine.  Follow-up with me in 2 weeks.  Discharge to home in good condition.  Antibiotics given:  Anti-infectives (From admission, onward)   Start     Dose/Rate Route Frequency Ordered Stop   02/22/18 1830  ceFAZolin (ANCEF) IVPB 2g/100 mL premix     2 g 200 mL/hr over 30 Minutes Intravenous Every 6 hours 02/22/18 1816 02/23/18 0629   02/22/18 1555  vancomycin (VANCOCIN) powder  Status:  Discontinued       As needed 02/22/18 1558 02/22/18 1655   02/22/18 0929  ceFAZolin (ANCEF) IVPB 2g/100 mL premix     2 g 200 mL/hr over 30 Minutes Intravenous On call to O.R. 02/22/18 4742 02/22/18 1452    .  Recent vital signs:  Vitals:   02/23/18 0555 02/23/18 1400  BP:  (!) 115/50  Pulse: 65 89  Resp: 17 18  Temp: 98.3 F (36.8 C)   SpO2: 94% 93%    Recent laboratory studies:  Results for orders placed or performed during the hospital encounter of 02/22/18  Glucose, capillary  Result Value Ref Range   Glucose-Capillary 109 (H)  65 - 99 mg/dL  Glucose, capillary  Result Value Ref Range   Glucose-Capillary 117 (H) 65 - 99 mg/dL   Comment 1 Notify RN    Comment 2 Document in Chart   Glucose, capillary  Result Value Ref Range   Glucose-Capillary 135 (H) 65 - 99 mg/dL   Comment 1 Notify RN     Discharge Medications:   Allergies as of 02/23/2018      Reactions   Codeine Nausea And Vomiting   Sulfa Antibiotics Other (See Comments)   Allergic per pt's mother       Medication List    STOP taking these medications   acetaminophen 500 MG tablet Commonly known as:  TYLENOL     TAKE these medications   albuterol 108 (90 Base) MCG/ACT inhaler Commonly known as:  VENTOLIN HFA Inhale two puffs every 4-6 hours if needed for cough or wheeze Notes to patient:  LAST DOSE WAS 0930 ON Friday MORNING   aspirin EC 81 MG tablet Take 81 mg by mouth daily. Notes to patient:  LAST DOSE WAS 0930 AM ON FRIDAY   atorvastatin 20 MG tablet Commonly known as:  LIPITOR Take 20 mg by mouth every evening.   beclomethasone 80 MCG/ACT inhaler Commonly known as:  QVAR REDIHALER Inhale two doses twice daily during flare-up.  Rinse, gargle, and spit after use.   cholecalciferol 1000 units tablet Commonly known as:  VITAMIN D Take 1,000 Units by mouth daily. Notes to patient:  LAST DOSE WAS 0930 AM ON FRIDAY   citalopram 20 MG tablet Commonly known as:  CELEXA Take 20 mg by mouth daily. Notes to patient:  LAST DOSE WAS 0930 ON FRIDAY   dicyclomine 10 MG capsule Commonly known as:  BENTYL Take 10 mg by mouth 2 (two) times daily.   DULERA 200-5 MCG/ACT Aero Generic drug:  mometasone-formoterol Inhale 2 puffs into the lungs 2 (two) times daily.   fluticasone 50 MCG/ACT nasal spray Commonly known as:  FLONASE Use one spray in each nostril once or twice daily What changed:    how much to take  how to take this  when to take this  additional instructions   ICAPS AREDS 2 PO Take 1 capsule by mouth 2 (two) times  daily.   ipratropium 0.06 % nasal spray Commonly known as:  ATROVENT Use two sprays in each nostril every 6 hours if needed for runny nose.   ipratropium-albuterol 0.5-2.5 (3) MG/3ML Soln Commonly known as:  DUONEB Take 3 mLs by nebulization every 4 (four) hours as needed (shortness of breath).   loratadine 10 MG tablet Commonly known as:  CLARITIN Take 10 mg by mouth daily. Notes to patient:  LAST DOSE 0930 AM ON FRIDAY   losartan 100 MG tablet Commonly known as:  COZAAR Take 100 mg by mouth daily. Notes to patient:  LAST DOSE 0930 AM ON FRIDAY   omalizumab 150 MG injection Commonly known as:  XOLAIR Inject 150 mg into the skin every 28 (twenty-eight) days.   omeprazole 40 MG capsule Commonly known as:  PRILOSEC TAKE 1 CAPSULE BY MOUTH TWICE DAILY   pioglitazone 30 MG tablet Commonly known as:  ACTOS Take 30 mg by mouth daily. Notes to patient:  LAST DOSE 0930 AM ON FRIDAY   PROBIOTIC DAILY PO Take 1 capsule by mouth at bedtime.   VICTOZA 18 MG/3ML Sopn Generic drug:  liraglutide Inject 0.6 mg into the skin daily.   VISINE OP Place 1 drop into both eyes daily as needed (dry eyes).   Vitamin B-12 5000 MCG Lozg Take 5,000 mcg by mouth daily. Notes to patient:  LAST DOSE 0930 AM ON FRIDAY       Diagnostic Studies: Ct Shoulder Left Wo Contrast  Result Date: 02/20/2018 CLINICAL DATA:  Left shoulder pain.  Preop planning. EXAM: CT OF THE UPPER LEFT EXTREMITY WITHOUT CONTRAST TECHNIQUE: Multidetector CT imaging of the upper left extremity was performed according to the standard protocol. COMPARISON:  None. FINDINGS: Bones/Joint/Cartilage No fracture or dislocation. Normal alignment. No joint effusion. Severe left glenohumeral joint space narrowing with subchondral cystic changes and subchondral sclerosis. Inferior humeral marginal osteophytes. Tiny inferior glenoid marginal osteophyte. Mild arthropathy of the acromioclavicular joint.  Type I acromion. No aggressive  osseous lesion. Ligaments Ligaments are suboptimally evaluated by CT. Muscles and Tendons Muscles are normal.  No muscle atrophy. Soft tissue No fluid collection or hematoma. No soft tissue mass. Right lung demonstrates no focal consolidation, pleural effusion or pneumothorax. Calcified 5 mm and 11 mm left upper lobe pulmonary nodule consistent with sequela prior granulomatous disease. Other smaller pulmonary nodules are noted. Mild bilateral upper and lower lobe bronchiectasis. Thoracic aortic atherosclerosis. 5 mm hypodense right thyroid nodule. IMPRESSION: 1. Severe advanced osteoarthritis of the left glenohumeral joint. 2.  Aortic Atherosclerosis (ICD10-I70.0). Electronically Signed   By: Kathreen Devoid   On: 02/20/2018 09:09   Dg Shoulder Left Port  Result  Date: 02/22/2018 CLINICAL DATA:  Postop shoulder replacement EXAM: LEFT SHOULDER - 1 VIEW COMPARISON:  None. FINDINGS: Status post left shoulder total arthroplasty. Humeral component is well seated. No evidence of immediate abnormality. IMPRESSION: Expected postoperative appearance of left total shoulder arthroplasty. Electronically Signed   By: Ulyses Jarred M.D.   On: 02/22/2018 19:57    Disposition:   Discharge Instructions    Call MD / Call 911   Complete by:  As directed    If you experience chest pain or shortness of breath, CALL 911 and be transported to the hospital emergency room.  If you develope a fever above 101 F, pus (white drainage) or increased drainage or redness at the wound, or calf pain, call your surgeon's office.   Constipation Prevention   Complete by:  As directed    Drink plenty of fluids.  Prune juice may be helpful.  You may use a stool softener, such as Colace (over the counter) 100 mg twice a day.  Use MiraLax (over the counter) for constipation as needed.   Diet - low sodium heart healthy   Complete by:  As directed    Discharge instructions   Complete by:  As directed    Use range of motion machine 1 hour 3  times a day Stay in sling for comfort when not in the machine No lifting with left arm until return clinic visit Okay to remove dressing 03/04/2018.  Okay to shower with dressing in place it is waterproof   Increase activity slowly as tolerated   Complete by:  As directed          Signed: Anderson Malta 03/01/2018, 7:04 PM

## 2018-03-01 NOTE — Anesthesia Postprocedure Evaluation (Signed)
Anesthesia Post Note  Patient: [name]  Procedure(s) Performed: LEFT REVERSE SHOULDER ARTHROPLASTY (Left Shoulder)     Patient location during evaluation: PACU Anesthesia Type: General and Regional Level of consciousness: awake and alert Pain management: pain level controlled Vital Signs Assessment: post-procedure vital signs reviewed and stable Respiratory status: spontaneous breathing, nonlabored ventilation, respiratory function stable and patient connected to nasal cannula oxygen Cardiovascular status: blood pressure returned to baseline and stable Postop Assessment: no apparent nausea or vomiting Anesthetic complications: no    Last Vitals:  Vitals:   02/23/18 0555 02/23/18 1400  BP:  (!) 115/50  Pulse: 65 89  Resp: 17 18  Temp: 36.8 C   SpO2: 94% 93%    Last Pain:  Vitals:   02/23/18 1300  TempSrc:   PainSc: 6                  Madeeha Costantino

## 2018-03-02 NOTE — Telephone Encounter (Signed)
IC s/w patient advised to try stool softener first and if that did not help she could try enema. Advised for her to let us know if she still has difficulty after trying both things.

## 2018-03-05 ENCOUNTER — Ambulatory Visit: Payer: Medicare Other | Admitting: Allergy and Immunology

## 2018-03-06 DIAGNOSIS — Z471 Aftercare following joint replacement surgery: Secondary | ICD-10-CM | POA: Diagnosis not present

## 2018-03-06 DIAGNOSIS — E119 Type 2 diabetes mellitus without complications: Secondary | ICD-10-CM | POA: Diagnosis not present

## 2018-03-06 DIAGNOSIS — I10 Essential (primary) hypertension: Secondary | ICD-10-CM | POA: Diagnosis not present

## 2018-03-06 DIAGNOSIS — J45909 Unspecified asthma, uncomplicated: Secondary | ICD-10-CM | POA: Diagnosis not present

## 2018-03-06 DIAGNOSIS — K449 Diaphragmatic hernia without obstruction or gangrene: Secondary | ICD-10-CM | POA: Diagnosis not present

## 2018-03-06 DIAGNOSIS — H9190 Unspecified hearing loss, unspecified ear: Secondary | ICD-10-CM | POA: Diagnosis not present

## 2018-03-08 ENCOUNTER — Ambulatory Visit (INDEPENDENT_AMBULATORY_CARE_PROVIDER_SITE_OTHER): Payer: Medicare Other

## 2018-03-08 ENCOUNTER — Other Ambulatory Visit: Payer: Self-pay | Admitting: Allergy and Immunology

## 2018-03-08 ENCOUNTER — Encounter (INDEPENDENT_AMBULATORY_CARE_PROVIDER_SITE_OTHER): Payer: Self-pay | Admitting: Orthopedic Surgery

## 2018-03-08 ENCOUNTER — Ambulatory Visit (INDEPENDENT_AMBULATORY_CARE_PROVIDER_SITE_OTHER): Payer: Medicare Other | Admitting: Orthopedic Surgery

## 2018-03-08 DIAGNOSIS — Z96612 Presence of left artificial shoulder joint: Secondary | ICD-10-CM

## 2018-03-09 DIAGNOSIS — E119 Type 2 diabetes mellitus without complications: Secondary | ICD-10-CM | POA: Diagnosis not present

## 2018-03-09 DIAGNOSIS — J45909 Unspecified asthma, uncomplicated: Secondary | ICD-10-CM | POA: Diagnosis not present

## 2018-03-09 DIAGNOSIS — K449 Diaphragmatic hernia without obstruction or gangrene: Secondary | ICD-10-CM | POA: Diagnosis not present

## 2018-03-09 DIAGNOSIS — Z471 Aftercare following joint replacement surgery: Secondary | ICD-10-CM | POA: Diagnosis not present

## 2018-03-09 DIAGNOSIS — H9190 Unspecified hearing loss, unspecified ear: Secondary | ICD-10-CM | POA: Diagnosis not present

## 2018-03-09 DIAGNOSIS — I10 Essential (primary) hypertension: Secondary | ICD-10-CM | POA: Diagnosis not present

## 2018-03-11 ENCOUNTER — Encounter (INDEPENDENT_AMBULATORY_CARE_PROVIDER_SITE_OTHER): Payer: Self-pay | Admitting: Orthopedic Surgery

## 2018-03-11 NOTE — Progress Notes (Signed)
Post-Op Visit Note   Patient: [name]           Date of Birth: 27-May-1946           MRN: [id number] Visit Date: 03/08/2018 PCP: Myer Peer, MD   Assessment & Plan:  Chief Complaint:  Chief Complaint  Patient presents with  . Left Shoulder - Routine Post Op   Visit Diagnoses:  1. S/P reverse total shoulder arthroplasty, left     Plan: [name] is a patient who is now 2 weeks out reverse shoulder replacement on the left.  Patient's been doing reasonably well.  She is up to 90 degrees on the CPM machine.  Incision looks intact.  She is taking Norco for pain.  Plan at this time is to continue home health physical therapy and transition to outpatient therapy.  Come back in 4 weeks for clinical recheck.  Follow-Up Instructions: Return in about 1 month (around 04/05/2018).   Orders:  Orders Placed This Encounter  Procedures  . XR Shoulder Left  . Ambulatory referral to Physical Therapy   No orders of the defined types were placed in this encounter.   Imaging: No results found.  PMFS History: Patient Active Problem List   Diagnosis Date Noted  . Shoulder arthritis 02/22/2018  . Primary osteoarthritis, left shoulder 12/11/2017  . DDD (degenerative disc disease), lumbar 01/23/2017  . History of diabetes mellitus 01/23/2017  . History of depression 01/23/2017  . History of asthma 01/23/2017  . Dyslipidemia 01/23/2017  . Age-related osteoporosis without current pathological fracture 01/23/2017  . History of gastroesophageal reflux (GERD) 01/17/2017  . History of hypertension 01/17/2017  . Primary osteoarthritis of both knees 01/17/2017  . Primary osteoarthritis of both feet 01/17/2017  . Vitamin D deficiency 01/17/2017  . Rheumatoid factor positive 12/13/2016  . Primary osteoarthritis of both hands 12/13/2016  . Obstructive lung disease ([place]) 07/29/2015  . H/O allergic rhinitis 07/29/2015  . LPRD (laryngopharyngeal reflux disease) 07/29/2015  . Chest pain  11/01/2013  . Murmur 11/01/2013  . DM 03/03/2010  . ABDOMINAL PAIN RIGHT LOWER QUADRANT 03/03/2010  . FULL INCONTINENCE OF FECES 03/03/2010  . DECREASED HEARING 05/02/2008  . HYPERTENSION 05/02/2008  . ESOPHAGITIS 05/02/2008  . HIATAL HERNIA 05/02/2008  . RECTAL INCONTINENCE 05/02/2008  . SARCOIDOSIS 11/16/2007  . Severe persistent asthma 11/16/2007  . GERD 11/16/2007  . IRRITABLE BOWEL SYNDROME 11/16/2007   Past Medical History:  Diagnosis Date  . Allergy   . Anxiety   . Arthritis   . Asthma   . Cataract    bilateral and removed  . Depression   . Diaphragmatic hernia without mention of obstruction or gangrene   . DM (diabetes mellitus) ([place])   . Dyspnea   . Esophageal reflux   . Esophagitis, unspecified   . H/O adenomatous polyp of colon 2011  . Heart murmur    no cardiologist, family Dr. Maryfrances Bunnell  . History of kidney stones   . Hyperlipidemia   . Incontinence of feces   . Irritable bowel syndrome   . Kidney stones   . Sarcoidosis   . Sleep apnea    no cpap now  . Status post dilation of esophageal narrowing   . Unspecified asthma(493.90)   . Unspecified essential hypertension   . Unspecified hearing loss     Family History  Problem Relation Age of Onset  . Esophageal cancer Father   . Diabetes Mother   . Diabetes Maternal Grandmother   . Colon cancer  Neg Hx   . Rectal cancer Neg Hx   . Stomach cancer Neg Hx     Past Surgical History:  Procedure Laterality Date  . BLADDER SURGERY    . CATARACT EXTRACTION Bilateral   . HARDWARE REMOVAL Left 01/12/2017   Procedure: HARDWARE REMOVAL left wrist;  Surgeon: Leanora Cover, MD;  Location: [facility];  Service: Orthopedics;  Laterality: Left;  . HEMORRHOID SURGERY    . NASAL SEPTUM SURGERY    . PARTIAL HYSTERECTOMY    . REVERSE SHOULDER ARTHROPLASTY Left 02/22/2018  . REVERSE SHOULDER ARTHROPLASTY Left 02/22/2018   Procedure: LEFT REVERSE SHOULDER ARTHROPLASTY;  Surgeon: Meredith Pel, MD;   Location: [facility];  Service: Orthopedics;  Laterality: Left;  . TONSILLECTOMY AND ADENOIDECTOMY    . TRIGGER FINGER RELEASE Left 05/12/2016   Procedure: RELEASE TRIGGER FINGER/A-1 PULLEY INFECTION LEFT RING TENDON SHEATH INJECT RIGHT INDEX FINGER;  Surgeon: Leanora Cover, MD;  Location: [facility];  Service: Orthopedics;  Laterality: Left;  . TUBAL LIGATION     Social History   Occupational History  . Occupation: retired  Tobacco Use  . Smoking status: Never Smoker  . Smokeless tobacco: Never Used  Substance and Sexual Activity  . Alcohol use: No  . Drug use: No  . Sexual activity: Not on file

## 2018-03-13 DIAGNOSIS — M25612 Stiffness of left shoulder, not elsewhere classified: Secondary | ICD-10-CM | POA: Diagnosis not present

## 2018-03-13 DIAGNOSIS — M6281 Muscle weakness (generalized): Secondary | ICD-10-CM | POA: Diagnosis not present

## 2018-03-13 DIAGNOSIS — J4540 Moderate persistent asthma, uncomplicated: Secondary | ICD-10-CM | POA: Diagnosis not present

## 2018-03-13 DIAGNOSIS — M25512 Pain in left shoulder: Secondary | ICD-10-CM | POA: Diagnosis not present

## 2018-03-14 ENCOUNTER — Ambulatory Visit (INDEPENDENT_AMBULATORY_CARE_PROVIDER_SITE_OTHER): Payer: Medicare Other | Admitting: *Deleted

## 2018-03-14 DIAGNOSIS — J4540 Moderate persistent asthma, uncomplicated: Secondary | ICD-10-CM | POA: Diagnosis not present

## 2018-03-14 DIAGNOSIS — E1122 Type 2 diabetes mellitus with diabetic chronic kidney disease: Secondary | ICD-10-CM | POA: Diagnosis not present

## 2018-03-15 DIAGNOSIS — M25512 Pain in left shoulder: Secondary | ICD-10-CM | POA: Diagnosis not present

## 2018-03-15 DIAGNOSIS — M25612 Stiffness of left shoulder, not elsewhere classified: Secondary | ICD-10-CM | POA: Diagnosis not present

## 2018-03-15 DIAGNOSIS — M6281 Muscle weakness (generalized): Secondary | ICD-10-CM | POA: Diagnosis not present

## 2018-03-19 DIAGNOSIS — M25612 Stiffness of left shoulder, not elsewhere classified: Secondary | ICD-10-CM | POA: Diagnosis not present

## 2018-03-19 DIAGNOSIS — M25512 Pain in left shoulder: Secondary | ICD-10-CM | POA: Diagnosis not present

## 2018-03-19 DIAGNOSIS — M6281 Muscle weakness (generalized): Secondary | ICD-10-CM | POA: Diagnosis not present

## 2018-03-20 DIAGNOSIS — Z6830 Body mass index (BMI) 30.0-30.9, adult: Secondary | ICD-10-CM | POA: Diagnosis not present

## 2018-03-20 DIAGNOSIS — J4541 Moderate persistent asthma with (acute) exacerbation: Secondary | ICD-10-CM | POA: Diagnosis not present

## 2018-03-20 DIAGNOSIS — N390 Urinary tract infection, site not specified: Secondary | ICD-10-CM | POA: Diagnosis not present

## 2018-03-20 DIAGNOSIS — E119 Type 2 diabetes mellitus without complications: Secondary | ICD-10-CM | POA: Diagnosis not present

## 2018-03-21 DIAGNOSIS — E119 Type 2 diabetes mellitus without complications: Secondary | ICD-10-CM | POA: Diagnosis not present

## 2018-03-21 DIAGNOSIS — M6281 Muscle weakness (generalized): Secondary | ICD-10-CM | POA: Diagnosis not present

## 2018-03-21 DIAGNOSIS — M25512 Pain in left shoulder: Secondary | ICD-10-CM | POA: Diagnosis not present

## 2018-03-21 DIAGNOSIS — M25612 Stiffness of left shoulder, not elsewhere classified: Secondary | ICD-10-CM | POA: Diagnosis not present

## 2018-03-21 DIAGNOSIS — H353131 Nonexudative age-related macular degeneration, bilateral, early dry stage: Secondary | ICD-10-CM | POA: Diagnosis not present

## 2018-03-21 DIAGNOSIS — Z961 Presence of intraocular lens: Secondary | ICD-10-CM | POA: Diagnosis not present

## 2018-03-21 DIAGNOSIS — H43813 Vitreous degeneration, bilateral: Secondary | ICD-10-CM | POA: Diagnosis not present

## 2018-03-22 DIAGNOSIS — N390 Urinary tract infection, site not specified: Secondary | ICD-10-CM | POA: Diagnosis not present

## 2018-03-26 DIAGNOSIS — M25512 Pain in left shoulder: Secondary | ICD-10-CM | POA: Diagnosis not present

## 2018-03-26 DIAGNOSIS — M6281 Muscle weakness (generalized): Secondary | ICD-10-CM | POA: Diagnosis not present

## 2018-03-26 DIAGNOSIS — M25612 Stiffness of left shoulder, not elsewhere classified: Secondary | ICD-10-CM | POA: Diagnosis not present

## 2018-03-28 DIAGNOSIS — M6281 Muscle weakness (generalized): Secondary | ICD-10-CM | POA: Diagnosis not present

## 2018-03-28 DIAGNOSIS — M25512 Pain in left shoulder: Secondary | ICD-10-CM | POA: Diagnosis not present

## 2018-03-28 DIAGNOSIS — M25612 Stiffness of left shoulder, not elsewhere classified: Secondary | ICD-10-CM | POA: Diagnosis not present

## 2018-04-02 DIAGNOSIS — M25512 Pain in left shoulder: Secondary | ICD-10-CM | POA: Diagnosis not present

## 2018-04-02 DIAGNOSIS — M6281 Muscle weakness (generalized): Secondary | ICD-10-CM | POA: Diagnosis not present

## 2018-04-02 DIAGNOSIS — M25612 Stiffness of left shoulder, not elsewhere classified: Secondary | ICD-10-CM | POA: Diagnosis not present

## 2018-04-04 DIAGNOSIS — M25512 Pain in left shoulder: Secondary | ICD-10-CM | POA: Diagnosis not present

## 2018-04-04 DIAGNOSIS — M6281 Muscle weakness (generalized): Secondary | ICD-10-CM | POA: Diagnosis not present

## 2018-04-04 DIAGNOSIS — M25612 Stiffness of left shoulder, not elsewhere classified: Secondary | ICD-10-CM | POA: Diagnosis not present

## 2018-04-06 ENCOUNTER — Encounter (INDEPENDENT_AMBULATORY_CARE_PROVIDER_SITE_OTHER): Payer: Self-pay | Admitting: Orthopedic Surgery

## 2018-04-06 ENCOUNTER — Ambulatory Visit (INDEPENDENT_AMBULATORY_CARE_PROVIDER_SITE_OTHER): Payer: Medicare Other | Admitting: Orthopedic Surgery

## 2018-04-06 DIAGNOSIS — Z96612 Presence of left artificial shoulder joint: Secondary | ICD-10-CM

## 2018-04-06 NOTE — Progress Notes (Signed)
Post-Op Visit Note   Patient: [name]           Date of Birth: Sep 23, 1946           MRN: [id number] Visit Date: 04/06/2018 PCP: Myer Peer, MD   Assessment & Plan:  Chief Complaint:  Chief Complaint  Patient presents with  . Left Shoulder - Routine Post Op   Visit Diagnoses:  1. S/P reverse total shoulder arthroplasty, left     Plan: [name] is a 72 year old patient with left reverse shoulder replacement 6 weeks out.  On examination she is got about 90 degrees of forward flexion and abduction.  Her pain control is good with no real medication other than Tylenol.  In general the pain relief is been good.  Still has some functional deficits which we need to address with more physical therapy.  Continue with range of motion exercises primarily I will see her back in 6 weeks.  Follow-Up Instructions: Return in about 6 weeks (around 05/18/2018).   Orders:  No orders of the defined types were placed in this encounter.  No orders of the defined types were placed in this encounter.   Imaging: No results found.  PMFS History: Patient Active Problem List   Diagnosis Date Noted  . Shoulder arthritis 02/22/2018  . Primary osteoarthritis, left shoulder 12/11/2017  . DDD (degenerative disc disease), lumbar 01/23/2017  . History of diabetes mellitus 01/23/2017  . History of depression 01/23/2017  . History of asthma 01/23/2017  . Dyslipidemia 01/23/2017  . Age-related osteoporosis without current pathological fracture 01/23/2017  . History of gastroesophageal reflux (GERD) 01/17/2017  . History of hypertension 01/17/2017  . Primary osteoarthritis of both knees 01/17/2017  . Primary osteoarthritis of both feet 01/17/2017  . Vitamin D deficiency 01/17/2017  . Rheumatoid factor positive 12/13/2016  . Primary osteoarthritis of both hands 12/13/2016  . Obstructive lung disease ([place]) 07/29/2015  . H/O allergic rhinitis 07/29/2015  . LPRD (laryngopharyngeal reflux  disease) 07/29/2015  . Chest pain 11/01/2013  . Murmur 11/01/2013  . DM 03/03/2010  . ABDOMINAL PAIN RIGHT LOWER QUADRANT 03/03/2010  . FULL INCONTINENCE OF FECES 03/03/2010  . DECREASED HEARING 05/02/2008  . HYPERTENSION 05/02/2008  . ESOPHAGITIS 05/02/2008  . HIATAL HERNIA 05/02/2008  . RECTAL INCONTINENCE 05/02/2008  . SARCOIDOSIS 11/16/2007  . Severe persistent asthma 11/16/2007  . GERD 11/16/2007  . IRRITABLE BOWEL SYNDROME 11/16/2007   Past Medical History:  Diagnosis Date  . Allergy   . Anxiety   . Arthritis   . Asthma   . Cataract    bilateral and removed  . Depression   . Diaphragmatic hernia without mention of obstruction or gangrene   . DM (diabetes mellitus) ([place])   . Dyspnea   . Esophageal reflux   . Esophagitis, unspecified   . H/O adenomatous polyp of colon 2011  . Heart murmur    no cardiologist, family Dr. Maryfrances Bunnell  . History of kidney stones   . Hyperlipidemia   . Incontinence of feces   . Irritable bowel syndrome   . Kidney stones   . Sarcoidosis   . Sleep apnea    no cpap now  . Status post dilation of esophageal narrowing   . Unspecified asthma(493.90)   . Unspecified essential hypertension   . Unspecified hearing loss     Family History  Problem Relation Age of Onset  . Esophageal cancer Father   . Diabetes Mother   . Diabetes Maternal Grandmother   .  Colon cancer Neg Hx   . Rectal cancer Neg Hx   . Stomach cancer Neg Hx     Past Surgical History:  Procedure Laterality Date  . BLADDER SURGERY    . CATARACT EXTRACTION Bilateral   . HARDWARE REMOVAL Left 01/12/2017   Procedure: HARDWARE REMOVAL left wrist;  Surgeon: Leanora Cover, MD;  Location: [place];  Service: Orthopedics;  Laterality: Left;  . HEMORRHOID SURGERY    . NASAL SEPTUM SURGERY    . PARTIAL HYSTERECTOMY    . REVERSE SHOULDER ARTHROPLASTY Left 02/22/2018  . REVERSE SHOULDER ARTHROPLASTY Left 02/22/2018   Procedure: LEFT REVERSE SHOULDER ARTHROPLASTY;   Surgeon: Meredith Pel, MD;  Location: [facility];  Service: Orthopedics;  Laterality: Left;  . TONSILLECTOMY AND ADENOIDECTOMY    . TRIGGER FINGER RELEASE Left 05/12/2016   Procedure: RELEASE TRIGGER FINGER/A-1 PULLEY INFECTION LEFT RING TENDON SHEATH INJECT RIGHT INDEX FINGER;  Surgeon: Leanora Cover, MD;  Location: [facility];  Service: Orthopedics;  Laterality: Left;  . TUBAL LIGATION     Social History   Occupational History  . Occupation: retired  Tobacco Use  . Smoking status: Never Smoker  . Smokeless tobacco: Never Used  Substance and Sexual Activity  . Alcohol use: No  . Drug use: No  . Sexual activity: Not on file

## 2018-04-09 DIAGNOSIS — M25612 Stiffness of left shoulder, not elsewhere classified: Secondary | ICD-10-CM | POA: Diagnosis not present

## 2018-04-09 DIAGNOSIS — M6281 Muscle weakness (generalized): Secondary | ICD-10-CM | POA: Diagnosis not present

## 2018-04-09 DIAGNOSIS — M25512 Pain in left shoulder: Secondary | ICD-10-CM | POA: Diagnosis not present

## 2018-04-10 DIAGNOSIS — J4540 Moderate persistent asthma, uncomplicated: Secondary | ICD-10-CM | POA: Diagnosis not present

## 2018-04-11 ENCOUNTER — Ambulatory Visit: Payer: Self-pay

## 2018-04-11 ENCOUNTER — Ambulatory Visit (INDEPENDENT_AMBULATORY_CARE_PROVIDER_SITE_OTHER): Payer: Medicare Other

## 2018-04-11 DIAGNOSIS — M25512 Pain in left shoulder: Secondary | ICD-10-CM | POA: Diagnosis not present

## 2018-04-11 DIAGNOSIS — J4540 Moderate persistent asthma, uncomplicated: Secondary | ICD-10-CM | POA: Diagnosis not present

## 2018-04-11 DIAGNOSIS — M6281 Muscle weakness (generalized): Secondary | ICD-10-CM | POA: Diagnosis not present

## 2018-04-11 DIAGNOSIS — M25612 Stiffness of left shoulder, not elsewhere classified: Secondary | ICD-10-CM | POA: Diagnosis not present

## 2018-04-16 DIAGNOSIS — M6281 Muscle weakness (generalized): Secondary | ICD-10-CM | POA: Diagnosis not present

## 2018-04-16 DIAGNOSIS — M25512 Pain in left shoulder: Secondary | ICD-10-CM | POA: Diagnosis not present

## 2018-04-16 DIAGNOSIS — M25612 Stiffness of left shoulder, not elsewhere classified: Secondary | ICD-10-CM | POA: Diagnosis not present

## 2018-04-17 ENCOUNTER — Other Ambulatory Visit: Payer: Self-pay | Admitting: Allergy and Immunology

## 2018-04-18 DIAGNOSIS — M25512 Pain in left shoulder: Secondary | ICD-10-CM | POA: Diagnosis not present

## 2018-04-18 DIAGNOSIS — M25612 Stiffness of left shoulder, not elsewhere classified: Secondary | ICD-10-CM | POA: Diagnosis not present

## 2018-04-18 DIAGNOSIS — M6281 Muscle weakness (generalized): Secondary | ICD-10-CM | POA: Diagnosis not present

## 2018-04-24 DIAGNOSIS — M6281 Muscle weakness (generalized): Secondary | ICD-10-CM | POA: Diagnosis not present

## 2018-04-24 DIAGNOSIS — M25612 Stiffness of left shoulder, not elsewhere classified: Secondary | ICD-10-CM | POA: Diagnosis not present

## 2018-04-24 DIAGNOSIS — M25512 Pain in left shoulder: Secondary | ICD-10-CM | POA: Diagnosis not present

## 2018-04-25 DIAGNOSIS — L539 Erythematous condition, unspecified: Secondary | ICD-10-CM | POA: Diagnosis not present

## 2018-04-25 DIAGNOSIS — R0789 Other chest pain: Secondary | ICD-10-CM | POA: Diagnosis not present

## 2018-04-25 DIAGNOSIS — R609 Edema, unspecified: Secondary | ICD-10-CM | POA: Diagnosis not present

## 2018-04-25 DIAGNOSIS — R600 Localized edema: Secondary | ICD-10-CM | POA: Diagnosis not present

## 2018-04-25 DIAGNOSIS — I10 Essential (primary) hypertension: Secondary | ICD-10-CM | POA: Diagnosis not present

## 2018-05-01 DIAGNOSIS — M25612 Stiffness of left shoulder, not elsewhere classified: Secondary | ICD-10-CM | POA: Diagnosis not present

## 2018-05-01 DIAGNOSIS — M25512 Pain in left shoulder: Secondary | ICD-10-CM | POA: Diagnosis not present

## 2018-05-01 DIAGNOSIS — M6281 Muscle weakness (generalized): Secondary | ICD-10-CM | POA: Diagnosis not present

## 2018-05-03 DIAGNOSIS — M25512 Pain in left shoulder: Secondary | ICD-10-CM | POA: Diagnosis not present

## 2018-05-03 DIAGNOSIS — M25612 Stiffness of left shoulder, not elsewhere classified: Secondary | ICD-10-CM | POA: Diagnosis not present

## 2018-05-03 DIAGNOSIS — M6281 Muscle weakness (generalized): Secondary | ICD-10-CM | POA: Diagnosis not present

## 2018-05-04 ENCOUNTER — Other Ambulatory Visit: Payer: Self-pay | Admitting: *Deleted

## 2018-05-04 MED ORDER — ALBUTEROL SULFATE (2.5 MG/3ML) 0.083% IN NEBU: 2.5000 mg | INHALATION_SOLUTION | RESPIRATORY_TRACT | 1 refills | Status: DC | PRN

## 2018-05-04 NOTE — Telephone Encounter (Signed)
Patient called and requested albuterol for neb. Rx sent

## 2018-05-07 DIAGNOSIS — M25512 Pain in left shoulder: Secondary | ICD-10-CM | POA: Diagnosis not present

## 2018-05-07 DIAGNOSIS — M6281 Muscle weakness (generalized): Secondary | ICD-10-CM | POA: Diagnosis not present

## 2018-05-07 DIAGNOSIS — M25612 Stiffness of left shoulder, not elsewhere classified: Secondary | ICD-10-CM | POA: Diagnosis not present

## 2018-05-08 DIAGNOSIS — J4540 Moderate persistent asthma, uncomplicated: Secondary | ICD-10-CM

## 2018-05-09 ENCOUNTER — Ambulatory Visit (INDEPENDENT_AMBULATORY_CARE_PROVIDER_SITE_OTHER): Payer: Medicare Other

## 2018-05-09 DIAGNOSIS — J4540 Moderate persistent asthma, uncomplicated: Secondary | ICD-10-CM | POA: Diagnosis not present

## 2018-05-09 DIAGNOSIS — Z6831 Body mass index (BMI) 31.0-31.9, adult: Secondary | ICD-10-CM | POA: Diagnosis not present

## 2018-05-09 DIAGNOSIS — J4550 Severe persistent asthma, uncomplicated: Secondary | ICD-10-CM | POA: Diagnosis not present

## 2018-05-09 DIAGNOSIS — Z1339 Encounter for screening examination for other mental health and behavioral disorders: Secondary | ICD-10-CM | POA: Diagnosis not present

## 2018-05-09 DIAGNOSIS — I771 Stricture of artery: Secondary | ICD-10-CM | POA: Diagnosis not present

## 2018-05-11 DIAGNOSIS — M6281 Muscle weakness (generalized): Secondary | ICD-10-CM | POA: Diagnosis not present

## 2018-05-11 DIAGNOSIS — M25512 Pain in left shoulder: Secondary | ICD-10-CM | POA: Diagnosis not present

## 2018-05-11 DIAGNOSIS — M25612 Stiffness of left shoulder, not elsewhere classified: Secondary | ICD-10-CM | POA: Diagnosis not present

## 2018-05-14 ENCOUNTER — Encounter: Payer: Self-pay | Admitting: Podiatry

## 2018-05-14 ENCOUNTER — Ambulatory Visit (INDEPENDENT_AMBULATORY_CARE_PROVIDER_SITE_OTHER): Payer: Medicare Other | Admitting: Podiatry

## 2018-05-14 VITALS — BP 114/58 | HR 67 | Temp 98.6°F | Resp 16 | Ht 61.0 in | Wt 170.0 lb

## 2018-05-14 DIAGNOSIS — E1151 Type 2 diabetes mellitus with diabetic peripheral angiopathy without gangrene: Secondary | ICD-10-CM | POA: Diagnosis not present

## 2018-05-14 DIAGNOSIS — B351 Tinea unguium: Secondary | ICD-10-CM

## 2018-05-14 DIAGNOSIS — M6281 Muscle weakness (generalized): Secondary | ICD-10-CM | POA: Diagnosis not present

## 2018-05-14 DIAGNOSIS — M25612 Stiffness of left shoulder, not elsewhere classified: Secondary | ICD-10-CM | POA: Diagnosis not present

## 2018-05-14 DIAGNOSIS — M25512 Pain in left shoulder: Secondary | ICD-10-CM | POA: Diagnosis not present

## 2018-05-14 NOTE — Progress Notes (Signed)
   Subjective:    Patient ID: [name], female    DOB: March 23, 1946, 72 y.o.   MRN: [id number]  HPI    Review of Systems  All other systems reviewed and are negative.      Objective:   Physical Exam        Assessment & Plan:

## 2018-05-14 NOTE — Progress Notes (Signed)
Subjective:  Patient ID: [name], female    DOB: 1946/11/06,  MRN: [id number]  Chief Complaint  Patient presents with  . debride    B/L nail debridement -FBS: 131 A1C" don't now PCP: Scott LOV: June-14    72 y.o. female returns for diabetic foot care. Last AMBS was 131. Reports numbness and tingling in their feet. Reports cramping in legs and thighs.  72 y.o. female presents with the above complaint.  Past Medical History:  Diagnosis Date  . Allergy   . Anxiety   . Arthritis   . Asthma   . Cataract    bilateral and removed  . Depression   . Diaphragmatic hernia without mention of obstruction or gangrene   . DM (diabetes mellitus) ([place])   . Dyspnea   . Esophageal reflux   . Esophagitis, unspecified   . H/O adenomatous polyp of colon 2011  . Heart murmur    no cardiologist, family Dr. Maryfrances Bunnell  . History of kidney stones   . Hyperlipidemia   . Incontinence of feces   . Irritable bowel syndrome   . Kidney stones   . Sarcoidosis   . Sleep apnea    no cpap now  . Status post dilation of esophageal narrowing   . Unspecified asthma(493.90)   . Unspecified essential hypertension   . Unspecified hearing loss    Past Surgical History:  Procedure Laterality Date  . BLADDER SURGERY    . CATARACT EXTRACTION Bilateral   . HARDWARE REMOVAL Left 01/12/2017   Procedure: HARDWARE REMOVAL left wrist;  Surgeon: Leanora Cover, MD;  Location: [facility];  Service: Orthopedics;  Laterality: Left;  . HEMORRHOID SURGERY    . NASAL SEPTUM SURGERY    . PARTIAL HYSTERECTOMY    . REVERSE SHOULDER ARTHROPLASTY Left 02/22/2018  . REVERSE SHOULDER ARTHROPLASTY Left 02/22/2018   Procedure: LEFT REVERSE SHOULDER ARTHROPLASTY;  Surgeon: Meredith Pel, MD;  Location: [facility];  Service: Orthopedics;  Laterality: Left;  . TONSILLECTOMY AND ADENOIDECTOMY    . TRIGGER FINGER RELEASE Left 05/12/2016   Procedure: RELEASE TRIGGER FINGER/A-1 PULLEY INFECTION LEFT RING TENDON  SHEATH INJECT RIGHT INDEX FINGER;  Surgeon: Leanora Cover, MD;  Location: [place] [facility];  Service: Orthopedics;  Laterality: Left;  . TUBAL LIGATION      Current Outpatient Medications:  .  albuterol (PROVENTIL HFA;VENTOLIN HFA) 108 (90 Base) MCG/ACT inhaler, INHALE 2 PUFFS 4 TIMES A DAY AS NEEDED, Disp: 18 Inhaler, Rfl: 1 .  albuterol (PROVENTIL) (2.5 MG/3ML) 0.083% nebulizer solution, Take 3 mLs (2.5 mg total) by nebulization every 4 (four) hours as needed for wheezing or shortness of breath., Disp: 50 vial, Rfl: 1 .  aspirin EC 81 MG tablet, Take 81 mg by mouth daily., Disp: , Rfl:  .  atorvastatin (LIPITOR) 20 MG tablet, Take 20 mg by mouth every evening. , Disp: , Rfl:  .  beclomethasone (QVAR REDIHALER) 80 MCG/ACT inhaler, Inhale two doses twice daily during flare-up.  Rinse, gargle, and spit after use., Disp: 1 Inhaler, Rfl: 0 .  cholecalciferol (VITAMIN D) 1000 units tablet, Take 1,000 Units by mouth daily., Disp: , Rfl:  .  citalopram (CELEXA) 20 MG tablet, Take 20 mg by mouth daily.  , Disp: , Rfl:  .  Cyanocobalamin (VITAMIN B-12) 5000 MCG LOZG, Take 5,000 mcg by mouth daily., Disp: , Rfl:  .  dicyclomine (BENTYL) 10 MG capsule, Take 10 mg by mouth 2 (two) times daily. , Disp: , Rfl:  .  fluticasone (FLONASE) 50 MCG/ACT nasal spray, Use one spray in each nostril once or twice daily (Patient taking differently: Place 1 spray into both nostrils daily. ), Disp: 16 g, Rfl: 5 .  ipratropium (ATROVENT) 0.06 % nasal spray, Use two sprays in each nostril every 6 hours if needed for runny nose., Disp: 15 mL, Rfl: 5 .  ipratropium-albuterol (DUONEB) 0.5-2.5 (3) MG/3ML SOLN, Take 3 mLs by nebulization every 4 (four) hours as needed (shortness of breath). , Disp: , Rfl:  .  liraglutide (VICTOZA) 18 MG/3ML SOPN, Inject 0.6 mg into the skin daily. , Disp: , Rfl:  .  loratadine (CLARITIN) 10 MG tablet, Take 10 mg by mouth daily., Disp: , Rfl:  .  losartan (COZAAR) 100 MG tablet, Take 100 mg  by mouth daily., Disp: , Rfl:  .  mometasone-formoterol (DULERA) 200-5 MCG/ACT AERO, Inhale 2 puffs into the lungs 2 (two) times daily., Disp: , Rfl:  .  Multiple Vitamins-Minerals (ICAPS AREDS 2 PO), Take 1 capsule by mouth 2 (two) times daily., Disp: , Rfl:  .  omalizumab (XOLAIR) 150 MG injection, Inject 150 mg into the skin every 28 (twenty-eight) days. , Disp: , Rfl:  .  omeprazole (PRILOSEC) 40 MG capsule, TAKE 1 CAPSULE BY MOUTH TWICE A DAY, Disp: 60 capsule, Rfl: 4 .  pioglitazone (ACTOS) 30 MG tablet, Take 30 mg by mouth daily., Disp: , Rfl:  .  Probiotic Product (PROBIOTIC DAILY PO), Take 1 capsule by mouth at bedtime. , Disp: , Rfl:  .  Tetrahydrozoline HCl (VISINE OP), Place 1 drop into both eyes daily as needed (dry eyes)., Disp: , Rfl:   Current Facility-Administered Medications:  .  omalizumab Arvid Right) injection 150 mg, 150 mg, Subcutaneous, Q28 days, Kozlow, Donnamarie Poag, MD, 150 mg at 05/09/18 1012  Allergies  Allergen Reactions  . Codeine Nausea And Vomiting  . Sulfa Antibiotics Other (See Comments)    Allergic per pt's mother      Objective:   General AA&O x3. Normal mood and affect.  Vascular Dorsalis pedis pulses present 1+ bilaterally  Posterior tibial pulses absent bilaterally  Capillary refill normal to all digits. Pedal hair growth normal.  Neurologic Epicritic sensation present bilaterally. Protective sensation with 5.07 monofilament  present bilaterally. Vibratory sensation present bilaterally.  Dermatologic No open lesions. Interspaces clear of maceration.  Normal skin temperature and turgor. Hyperkeratotic lesions: None bilaterally. Nails: brittle, onychomycosis, thickening, elongation  Orthopedic: No history of amputation. MMT 5/5 in dorsiflexion, plantarflexion, inversion, and eversion. Normal lower extremity joint ROM without pain or crepitus.    Assessment & Plan:  Patient was evaluated and treated and all questions answered.  Diabetes with PAD,  Onychomycosis -Educated on diabetic footcare. Diabetic risk level 1 -At risk foot care provided as below.  Procedure: Nail Debridement Rationale: Patient meets criteria for routine foot care due to Class B findings. Type of Debridement: manual, sharp debridement. Instrumentation: Nail nipper, rotary burr. Number of Nails: 10  No follow-ups on file.

## 2018-05-18 DIAGNOSIS — M6281 Muscle weakness (generalized): Secondary | ICD-10-CM | POA: Diagnosis not present

## 2018-05-18 DIAGNOSIS — M25612 Stiffness of left shoulder, not elsewhere classified: Secondary | ICD-10-CM | POA: Diagnosis not present

## 2018-05-18 DIAGNOSIS — M25512 Pain in left shoulder: Secondary | ICD-10-CM | POA: Diagnosis not present

## 2018-05-21 DIAGNOSIS — M25612 Stiffness of left shoulder, not elsewhere classified: Secondary | ICD-10-CM | POA: Diagnosis not present

## 2018-05-21 DIAGNOSIS — M6281 Muscle weakness (generalized): Secondary | ICD-10-CM | POA: Diagnosis not present

## 2018-05-21 DIAGNOSIS — I6523 Occlusion and stenosis of bilateral carotid arteries: Secondary | ICD-10-CM | POA: Diagnosis not present

## 2018-05-21 DIAGNOSIS — M25512 Pain in left shoulder: Secondary | ICD-10-CM | POA: Diagnosis not present

## 2018-05-21 DIAGNOSIS — I771 Stricture of artery: Secondary | ICD-10-CM | POA: Diagnosis not present

## 2018-05-23 ENCOUNTER — Ambulatory Visit (INDEPENDENT_AMBULATORY_CARE_PROVIDER_SITE_OTHER): Payer: Medicare Other | Admitting: Orthopedic Surgery

## 2018-05-23 ENCOUNTER — Encounter (INDEPENDENT_AMBULATORY_CARE_PROVIDER_SITE_OTHER): Payer: Self-pay | Admitting: Orthopedic Surgery

## 2018-05-23 DIAGNOSIS — Z96612 Presence of left artificial shoulder joint: Secondary | ICD-10-CM

## 2018-05-23 NOTE — Progress Notes (Signed)
Post-Op Visit Note   Patient: [name]           Date of Birth: 1946-10-24           MRN: [id number] Visit Date: 05/23/2018 PCP: Myer Peer, MD   Assessment & Plan:  Chief Complaint:  Chief Complaint  Patient presents with  . Left Shoulder - Follow-up   Visit Diagnoses:  1. S/P reverse total shoulder arthroplasty, left     Plan: [name] is a 72 year old patient 3 months out left reverse shoulder replacement.  She is been doing well pain wise.  Still is having little trouble getting her motion above 90 degrees of forward flexion and abduction.  On examination she does have good deltoid strength.  Passive range of motion is good below shoulder level but he gets a little stiff above 90 degrees of abduction and forward flexion.  Therapy note is reviewed and they are going to have her do a home exercise program.  I think home exercise program focusing on stretching is good.  Also gave her a Thera-Band to work some on strengthening.  I will see her back as needed.  She has minimal pain with her left shoulder.  Follow-Up Instructions: Return if symptoms worsen or fail to improve.   Orders:  No orders of the defined types were placed in this encounter.  No orders of the defined types were placed in this encounter.   Imaging: No results found.  PMFS History: Patient Active Problem List   Diagnosis Date Noted  . Shoulder arthritis 02/22/2018  . Primary osteoarthritis, left shoulder 12/11/2017  . DDD (degenerative disc disease), lumbar 01/23/2017  . History of diabetes mellitus 01/23/2017  . History of depression 01/23/2017  . History of asthma 01/23/2017  . Dyslipidemia 01/23/2017  . Age-related osteoporosis without current pathological fracture 01/23/2017  . History of gastroesophageal reflux (GERD) 01/17/2017  . History of hypertension 01/17/2017  . Primary osteoarthritis of both knees 01/17/2017  . Primary osteoarthritis of both feet 01/17/2017  . Vitamin D  deficiency 01/17/2017  . Rheumatoid factor positive 12/13/2016  . Primary osteoarthritis of both hands 12/13/2016  . Obstructive lung disease ([place]) 07/29/2015  . H/O allergic rhinitis 07/29/2015  . LPRD (laryngopharyngeal reflux disease) 07/29/2015  . Chest pain 11/01/2013  . Murmur 11/01/2013  . DM 03/03/2010  . ABDOMINAL PAIN RIGHT LOWER QUADRANT 03/03/2010  . FULL INCONTINENCE OF FECES 03/03/2010  . DECREASED HEARING 05/02/2008  . HYPERTENSION 05/02/2008  . ESOPHAGITIS 05/02/2008  . HIATAL HERNIA 05/02/2008  . RECTAL INCONTINENCE 05/02/2008  . SARCOIDOSIS 11/16/2007  . Severe persistent asthma 11/16/2007  . GERD 11/16/2007  . IRRITABLE BOWEL SYNDROME 11/16/2007   Past Medical History:  Diagnosis Date  . Allergy   . Anxiety   . Arthritis   . Asthma   . Cataract    bilateral and removed  . Depression   . Diaphragmatic hernia without mention of obstruction or gangrene   . DM (diabetes mellitus) ([place])   . Dyspnea   . Esophageal reflux   . Esophagitis, unspecified   . H/O adenomatous polyp of colon 2011  . Heart murmur    no cardiologist, family Dr. Maryfrances Bunnell  . History of kidney stones   . Hyperlipidemia   . Incontinence of feces   . Irritable bowel syndrome   . Kidney stones   . Sarcoidosis   . Sleep apnea    no cpap now  . Status post dilation of esophageal narrowing   .  Unspecified asthma(493.90)   . Unspecified essential hypertension   . Unspecified hearing loss     Family History  Problem Relation Age of Onset  . Esophageal cancer Father   . Diabetes Mother   . Diabetes Maternal Grandmother   . Colon cancer Neg Hx   . Rectal cancer Neg Hx   . Stomach cancer Neg Hx     Past Surgical History:  Procedure Laterality Date  . BLADDER SURGERY    . CATARACT EXTRACTION Bilateral   . HARDWARE REMOVAL Left 01/12/2017   Procedure: HARDWARE REMOVAL left wrist;  Surgeon: Leanora Cover, MD;  Location: [facility];  Service: Orthopedics;  Laterality:  Left;  . HEMORRHOID SURGERY    . NASAL SEPTUM SURGERY    . PARTIAL HYSTERECTOMY    . REVERSE SHOULDER ARTHROPLASTY Left 02/22/2018  . REVERSE SHOULDER ARTHROPLASTY Left 02/22/2018   Procedure: LEFT REVERSE SHOULDER ARTHROPLASTY;  Surgeon: Meredith Pel, MD;  Location: [facility];  Service: Orthopedics;  Laterality: Left;  . TONSILLECTOMY AND ADENOIDECTOMY    . TRIGGER FINGER RELEASE Left 05/12/2016   Procedure: RELEASE TRIGGER FINGER/A-1 PULLEY INFECTION LEFT RING TENDON SHEATH INJECT RIGHT INDEX FINGER;  Surgeon: Leanora Cover, MD;  Location: [facility];  Service: Orthopedics;  Laterality: Left;  . TUBAL LIGATION     Social History   Occupational History  . Occupation: retired  Tobacco Use  . Smoking status: Never Smoker  . Smokeless tobacco: Never Used  Substance and Sexual Activity  . Alcohol use: No  . Drug use: No  . Sexual activity: Not on file

## 2018-06-05 ENCOUNTER — Encounter (INDEPENDENT_AMBULATORY_CARE_PROVIDER_SITE_OTHER): Payer: Self-pay | Admitting: Orthopaedic Surgery

## 2018-06-05 ENCOUNTER — Ambulatory Visit (INDEPENDENT_AMBULATORY_CARE_PROVIDER_SITE_OTHER): Payer: Medicare Other | Admitting: Orthopaedic Surgery

## 2018-06-05 VITALS — BP 164/67 | HR 68 | Ht 61.0 in | Wt 170.0 lb

## 2018-06-05 DIAGNOSIS — M1712 Unilateral primary osteoarthritis, left knee: Secondary | ICD-10-CM | POA: Diagnosis not present

## 2018-06-05 DIAGNOSIS — M1711 Unilateral primary osteoarthritis, right knee: Secondary | ICD-10-CM

## 2018-06-05 DIAGNOSIS — J4540 Moderate persistent asthma, uncomplicated: Secondary | ICD-10-CM | POA: Diagnosis not present

## 2018-06-05 DIAGNOSIS — M170 Bilateral primary osteoarthritis of knee: Secondary | ICD-10-CM

## 2018-06-05 MED ORDER — LIDOCAINE HCL 1 % IJ SOLN
2.0000 mL | INTRAMUSCULAR | Status: AC | PRN
  Administered 2018-06-05: 2 mL

## 2018-06-05 MED ORDER — BUPIVACAINE HCL 0.5 % IJ SOLN
2.0000 mL | INTRAMUSCULAR | Status: AC | PRN
  Administered 2018-06-05: 2 mL via INTRA_ARTICULAR

## 2018-06-05 MED ORDER — METHYLPREDNISOLONE ACETATE 40 MG/ML IJ SUSP
80.0000 mg | INTRAMUSCULAR | Status: AC | PRN
  Administered 2018-06-05: 80 mg

## 2018-06-05 NOTE — Progress Notes (Signed)
Office Visit Note   Patient: [name]           Date of Birth: March 14, 1946           MRN: [id number] Visit Date: 06/05/2018              Requested by: Myer Peer, MD [address] [place], [place] [address] PCP: Myer Peer, MD   Assessment & Plan: Visit Diagnoses:  1. Primary osteoarthritis of both knees     Plan: Bilateral primary osteoarthritis of the knees diagnosed with prior films in 2018.  Having more trouble on the left versus the right knee.  Long discussion regarding treatment options.  Will inject cortisone left knee today and have her return in 10 to 14 days and inject the right knee.  Patient is diabetic. she may also be a candidate for Visco supplementation which I will discuss with her in the future  Follow-Up Instructions: Return in about 2 weeks (around 06/19/2018).   Orders:  No orders of the defined types were placed in this encounter.  No orders of the defined types were placed in this encounter.     Procedures: Large Joint Inj: L knee on 06/05/2018 11:15 AM Indications: pain and diagnostic evaluation Details: 25 G 1.5 in needle, anteromedial approach  Arthrogram: No  Medications: 2 mL lidocaine 1 %; 2 mL bupivacaine 0.5 %; 80 mg methylPREDNISolone acetate 40 MG/ML Procedure, treatment alternatives, risks and benefits explained, specific risks discussed. Consent was given by the patient. Patient was prepped and draped in the usual sterile fashion.       Clinical Data: No additional findings.   Subjective: Chief Complaint  Patient presents with  . Follow-up    L KNEE PAIN WOULD LIKE INJECTION  [name] is a history of osteoarthritis of both knees diagnosed per films last year.  She is having progressive pain to the point of compromise without injury or trauma.  Presently she is having more trouble on the left than the right knee.  Her knees are swollen and oftentimes "tight".  Has not had any particular back pain or groin  discomfort.  Denies any skin rashes.  She is had a successful reverse shoulder arthroplasty earlier in the year. Films of both knees were obtained in August 2018 revealing moderate to severe osteoarthritis bilaterally. S He has had a prior cortisone injection left knee with several months relief of her pain.  HPI  Review of Systems  Constitutional: Positive for fatigue. Negative for fever.  HENT: Negative for ear pain.   Eyes: Negative for pain.  Respiratory: Positive for shortness of breath. Negative for cough.   Cardiovascular: Positive for leg swelling.  Gastrointestinal: Negative for constipation and diarrhea.  Genitourinary: Negative for difficulty urinating.  Musculoskeletal: Positive for back pain. Negative for neck pain.  Skin: Negative for rash.  Allergic/Immunologic: Negative for food allergies.  Neurological: Positive for weakness. Negative for numbness.  Hematological: Bruises/bleeds easily.  Psychiatric/Behavioral: Positive for sleep disturbance.     Objective: Vital Signs: BP (!) 164/67 (BP Location: Left Arm, Patient Position: Sitting, Cuff Size: Normal)   Pulse 68   Ht 5\' 1"  (1.549 m)   Wt 170 lb (77.1 kg)   BMI 32.12 kg/m   Physical Exam  Constitutional: She is oriented to person, place, and time. She appears well-developed and well-nourished.  HENT:  Mouth/Throat: Oropharynx is clear and moist.  Eyes: Pupils are equal, round, and reactive to light. EOM are normal.  Pulmonary/Chest: Effort normal.  Neurological: She is alert and oriented to person, place, and time.  Skin: Skin is warm and dry.  Psychiatric: She has a normal mood and affect. Her behavior is normal.    Ortho Exam awake alert and oriented x3.  Comfortable sitting.  Large legs.  Full extension about 90 degrees of flexion.  Diffuse medial joint pain bilaterally but more on the left than the right.  Some patellar crepitation bilaterally.  No lateral joint pain.  No calf pain.  Does have some  nonpitting edema both ankles and both feet.  Good sensibility.  Both feet are warm.  Specialty Comments:  No specialty comments available.  Imaging: No results found.   PMFS History: Patient Active Problem List   Diagnosis Date Noted  . Shoulder arthritis 02/22/2018  . Primary osteoarthritis, left shoulder 12/11/2017  . DDD (degenerative disc disease), lumbar 01/23/2017  . History of diabetes mellitus 01/23/2017  . History of depression 01/23/2017  . History of asthma 01/23/2017  . Dyslipidemia 01/23/2017  . Age-related osteoporosis without current pathological fracture 01/23/2017  . History of gastroesophageal reflux (GERD) 01/17/2017  . History of hypertension 01/17/2017  . Primary osteoarthritis of both knees 01/17/2017  . Primary osteoarthritis of both feet 01/17/2017  . Vitamin D deficiency 01/17/2017  . Rheumatoid factor positive 12/13/2016  . Primary osteoarthritis of both hands 12/13/2016  . Obstructive lung disease ([place]) 07/29/2015  . H/O allergic rhinitis 07/29/2015  . LPRD (laryngopharyngeal reflux disease) 07/29/2015  . Chest pain 11/01/2013  . Murmur 11/01/2013  . DM 03/03/2010  . ABDOMINAL PAIN RIGHT LOWER QUADRANT 03/03/2010  . FULL INCONTINENCE OF FECES 03/03/2010  . DECREASED HEARING 05/02/2008  . HYPERTENSION 05/02/2008  . ESOPHAGITIS 05/02/2008  . HIATAL HERNIA 05/02/2008  . RECTAL INCONTINENCE 05/02/2008  . SARCOIDOSIS 11/16/2007  . Severe persistent asthma 11/16/2007  . GERD 11/16/2007  . IRRITABLE BOWEL SYNDROME 11/16/2007   Past Medical History:  Diagnosis Date  . Allergy   . Anxiety   . Arthritis   . Asthma   . Cataract    bilateral and removed  . Depression   . Diaphragmatic hernia without mention of obstruction or gangrene   . DM (diabetes mellitus) ([place])   . Dyspnea   . Esophageal reflux   . Esophagitis, unspecified   . H/O adenomatous polyp of colon 2011  . Heart murmur    no cardiologist, family Dr. Maryfrances Bunnell  . History of  kidney stones   . Hyperlipidemia   . Incontinence of feces   . Irritable bowel syndrome   . Kidney stones   . Sarcoidosis   . Sleep apnea    no cpap now  . Status post dilation of esophageal narrowing   . Unspecified asthma(493.90)   . Unspecified essential hypertension   . Unspecified hearing loss     Family History  Problem Relation Age of Onset  . Esophageal cancer Father   . Diabetes Mother   . Diabetes Maternal Grandmother   . Colon cancer Neg Hx   . Rectal cancer Neg Hx   . Stomach cancer Neg Hx     Past Surgical History:  Procedure Laterality Date  . BLADDER SURGERY    . CATARACT EXTRACTION Bilateral   . HARDWARE REMOVAL Left 01/12/2017   Procedure: HARDWARE REMOVAL left wrist;  Surgeon: Leanora Cover, MD;  Location: [facility];  Service: Orthopedics;  Laterality: Left;  . HEMORRHOID SURGERY    . NASAL SEPTUM SURGERY    .  PARTIAL HYSTERECTOMY    . REVERSE SHOULDER ARTHROPLASTY Left 02/22/2018  . REVERSE SHOULDER ARTHROPLASTY Left 02/22/2018   Procedure: LEFT REVERSE SHOULDER ARTHROPLASTY;  Surgeon: Meredith Pel, MD;  Location: [facility];  Service: Orthopedics;  Laterality: Left;  . TONSILLECTOMY AND ADENOIDECTOMY    . TRIGGER FINGER RELEASE Left 05/12/2016   Procedure: RELEASE TRIGGER FINGER/A-1 PULLEY INFECTION LEFT RING TENDON SHEATH INJECT RIGHT INDEX FINGER;  Surgeon: Leanora Cover, MD;  Location: [facility];  Service: Orthopedics;  Laterality: Left;  . TUBAL LIGATION     Social History   Occupational History  . Occupation: retired  Tobacco Use  . Smoking status: Never Smoker  . Smokeless tobacco: Never Used  Substance and Sexual Activity  . Alcohol use: No  . Drug use: No  . Sexual activity: Not on file

## 2018-06-06 ENCOUNTER — Ambulatory Visit (INDEPENDENT_AMBULATORY_CARE_PROVIDER_SITE_OTHER): Payer: Medicare Other | Admitting: *Deleted

## 2018-06-06 DIAGNOSIS — J4540 Moderate persistent asthma, uncomplicated: Secondary | ICD-10-CM | POA: Diagnosis not present

## 2018-06-07 DIAGNOSIS — J4550 Severe persistent asthma, uncomplicated: Secondary | ICD-10-CM | POA: Diagnosis not present

## 2018-06-07 DIAGNOSIS — R351 Nocturia: Secondary | ICD-10-CM | POA: Diagnosis not present

## 2018-06-07 DIAGNOSIS — Z6830 Body mass index (BMI) 30.0-30.9, adult: Secondary | ICD-10-CM | POA: Diagnosis not present

## 2018-06-08 DIAGNOSIS — R69 Illness, unspecified: Secondary | ICD-10-CM | POA: Diagnosis not present

## 2018-06-08 DIAGNOSIS — R300 Dysuria: Secondary | ICD-10-CM | POA: Diagnosis not present

## 2018-06-08 DIAGNOSIS — R351 Nocturia: Secondary | ICD-10-CM | POA: Diagnosis not present

## 2018-06-22 ENCOUNTER — Ambulatory Visit (INDEPENDENT_AMBULATORY_CARE_PROVIDER_SITE_OTHER): Payer: Medicare Other | Admitting: Orthopaedic Surgery

## 2018-06-22 ENCOUNTER — Encounter (INDEPENDENT_AMBULATORY_CARE_PROVIDER_SITE_OTHER): Payer: Self-pay | Admitting: Orthopaedic Surgery

## 2018-06-22 ENCOUNTER — Other Ambulatory Visit: Payer: Self-pay | Admitting: Allergy and Immunology

## 2018-06-22 VITALS — BP 140/52 | HR 60 | Ht 61.0 in | Wt 170.0 lb

## 2018-06-22 DIAGNOSIS — M1711 Unilateral primary osteoarthritis, right knee: Secondary | ICD-10-CM

## 2018-06-22 DIAGNOSIS — M170 Bilateral primary osteoarthritis of knee: Secondary | ICD-10-CM

## 2018-06-22 DIAGNOSIS — M1712 Unilateral primary osteoarthritis, left knee: Secondary | ICD-10-CM | POA: Diagnosis not present

## 2018-06-22 MED ORDER — BUPIVACAINE HCL 0.5 % IJ SOLN
2.0000 mL | INTRAMUSCULAR | Status: AC | PRN
  Administered 2018-06-22: 2 mL via INTRA_ARTICULAR

## 2018-06-22 MED ORDER — LIDOCAINE HCL 1 % IJ SOLN
2.0000 mL | INTRAMUSCULAR | Status: AC | PRN
  Administered 2018-06-22: 2 mL

## 2018-06-22 MED ORDER — METHYLPREDNISOLONE ACETATE 40 MG/ML IJ SUSP
80.0000 mg | INTRAMUSCULAR | Status: AC | PRN
  Administered 2018-06-22: 80 mg

## 2018-06-22 NOTE — Progress Notes (Signed)
Office Visit Note   Patient: [name]           Date of Birth: 1946/04/04           MRN: [id number] Visit Date: 06/22/2018              Requested by: Myer Peer, MD [place], [place] [address] PCP: Myer Peer, MD   Assessment & Plan: Visit Diagnoses:  1. Primary osteoarthritis of both knees     Plan: Cortisone injection left knee several weeks ago made a big difference.  [name] would like to have the right knee injected.  Will also check with insurance company regarding Visco supplementation for both knees  Follow-Up Instructions: Return will pre cert visco.   Orders:  No orders of the defined types were placed in this encounter.  No orders of the defined types were placed in this encounter.     Procedures: Large Joint Inj: R knee on 06/22/2018 10:33 AM Indications: pain and diagnostic evaluation Details: 25 G 1.5 in needle, anteromedial approach  Arthrogram: No  Medications: 2 mL lidocaine 1 %; 2 mL bupivacaine 0.5 %; 80 mg methylPREDNISolone acetate 40 MG/ML Procedure, treatment alternatives, risks and benefits explained, specific risks discussed. Consent was given by the patient. Immediately prior to procedure a time out was called to verify the correct patient, procedure, equipment, support staff and site/side marked as required. Patient was prepped and draped in the usual sterile fashion.       Clinical Data: No additional findings.   Subjective: Chief Complaint  Patient presents with  . Follow-up    R KNEE PAIN WOULD LIKE DISCUSS INJECTIONS  Several weeks ago had a successful cortisone injection left knee for osteoarthritis.  Will inject today  HPI  Review of Systems   Objective: Vital Signs: BP (!) 140/52 (BP Location: Right Arm, Patient Position: Sitting, Cuff Size: Normal)   Pulse 60   Ht 5\' 1"  (1.549 m)   Wt 170 lb (77.1 kg)   BMI 32.12 kg/m   Physical Exam  Ortho Exam awake alert and oriented x3.   Comfortable sitting.  Small effusion right knee.  None on the left.  Full extension flexion about 100 and 102 degrees.  No calf pain no distal edema.  Mostly medial joint pain right knee  Specialty Comments:  No specialty comments available.  Imaging: No results found.   PMFS History: Patient Active Problem List   Diagnosis Date Noted  . Shoulder arthritis 02/22/2018  . Primary osteoarthritis, left shoulder 12/11/2017  . DDD (degenerative disc disease), lumbar 01/23/2017  . History of diabetes mellitus 01/23/2017  . History of depression 01/23/2017  . History of asthma 01/23/2017  . Dyslipidemia 01/23/2017  . Age-related osteoporosis without current pathological fracture 01/23/2017  . History of gastroesophageal reflux (GERD) 01/17/2017  . History of hypertension 01/17/2017  . Primary osteoarthritis of both knees 01/17/2017  . Primary osteoarthritis of both feet 01/17/2017  . Vitamin D deficiency 01/17/2017  . Rheumatoid factor positive 12/13/2016  . Primary osteoarthritis of both hands 12/13/2016  . Obstructive lung disease ([place]) 07/29/2015  . H/O allergic rhinitis 07/29/2015  . LPRD (laryngopharyngeal reflux disease) 07/29/2015  . Chest pain 11/01/2013  . Murmur 11/01/2013  . DM 03/03/2010  . ABDOMINAL PAIN RIGHT LOWER QUADRANT 03/03/2010  . FULL INCONTINENCE OF FECES 03/03/2010  . DECREASED HEARING 05/02/2008  . HYPERTENSION 05/02/2008  . ESOPHAGITIS 05/02/2008  . HIATAL HERNIA 05/02/2008  . RECTAL INCONTINENCE  05/02/2008  . SARCOIDOSIS 11/16/2007  . Severe persistent asthma 11/16/2007  . GERD 11/16/2007  . IRRITABLE BOWEL SYNDROME 11/16/2007   Past Medical History:  Diagnosis Date  . Allergy   . Anxiety   . Arthritis   . Asthma   . Cataract    bilateral and removed  . Depression   . Diaphragmatic hernia without mention of obstruction or gangrene   . DM (diabetes mellitus) ([place])   . Dyspnea   . Esophageal reflux   . Esophagitis, unspecified   . H/O  adenomatous polyp of colon 2011  . Heart murmur    no cardiologist, family Dr. Maryfrances Bunnell  . History of kidney stones   . Hyperlipidemia   . Incontinence of feces   . Irritable bowel syndrome   . Kidney stones   . Sarcoidosis   . Sleep apnea    no cpap now  . Status post dilation of esophageal narrowing   . Unspecified asthma(493.90)   . Unspecified essential hypertension   . Unspecified hearing loss     Family History  Problem Relation Age of Onset  . Esophageal cancer Father   . Diabetes Mother   . Diabetes Maternal Grandmother   . Colon cancer Neg Hx   . Rectal cancer Neg Hx   . Stomach cancer Neg Hx     Past Surgical History:  Procedure Laterality Date  . BLADDER SURGERY    . CATARACT EXTRACTION Bilateral   . HARDWARE REMOVAL Left 01/12/2017   Procedure: HARDWARE REMOVAL left wrist;  Surgeon: Leanora Cover, MD;  Location: [facility];  Service: Orthopedics;  Laterality: Left;  . HEMORRHOID SURGERY    . NASAL SEPTUM SURGERY    . PARTIAL HYSTERECTOMY    . REVERSE SHOULDER ARTHROPLASTY Left 02/22/2018  . REVERSE SHOULDER ARTHROPLASTY Left 02/22/2018   Procedure: LEFT REVERSE SHOULDER ARTHROPLASTY;  Surgeon: Meredith Pel, MD;  Location: [facility];  Service: Orthopedics;  Laterality: Left;  . TONSILLECTOMY AND ADENOIDECTOMY    . TRIGGER FINGER RELEASE Left 05/12/2016   Procedure: RELEASE TRIGGER FINGER/A-1 PULLEY INFECTION LEFT RING TENDON SHEATH INJECT RIGHT INDEX FINGER;  Surgeon: Leanora Cover, MD;  Location: [facility];  Service: Orthopedics;  Laterality: Left;  . TUBAL LIGATION     Social History   Occupational History  . Occupation: retired  Tobacco Use  . Smoking status: Never Smoker  . Smokeless tobacco: Never Used  Substance and Sexual Activity  . Alcohol use: No  . Drug use: No  . Sexual activity: Not on file     Garald Balding, MD   Note - This record has been created using [employer].  Chart creation errors have  been sought, but may not always  have been located. Such creation errors do not reflect on  the standard of medical care.

## 2018-06-22 NOTE — Progress Notes (Deleted)
Office Visit Note   Patient: [name]           Date of Birth: Feb 08, 1946           MRN: [id number] Visit Date: 06/22/2018              Requested by: Myer Peer, MD [address], [place] [id number] PCP: Myer Peer, MD   Assessment & Plan: Visit Diagnoses: No diagnosis found.  Plan: ***  Follow-Up Instructions: No follow-ups on file.   Orders:  No orders of the defined types were placed in this encounter.  No orders of the defined types were placed in this encounter.     Procedures: No procedures performed   Clinical Data: No additional findings.   Subjective: No chief complaint on file.   HPI  Review of Systems  Constitutional: Negative for fatigue and fever.  HENT: Negative for ear pain.   Eyes: Negative for pain.  Respiratory: Negative for cough and shortness of breath.   Cardiovascular: Negative for leg swelling.  Gastrointestinal: Positive for constipation. Negative for diarrhea.  Genitourinary: Negative for difficulty urinating.  Musculoskeletal: Negative for back pain and neck pain.  Skin: Negative for rash.  Allergic/Immunologic: Negative for food allergies.  Neurological: Negative for weakness and numbness.  Hematological: Bruises/bleeds easily.  Psychiatric/Behavioral: Positive for sleep disturbance.     Objective: Vital Signs: There were no vitals taken for this visit.  Physical Exam  Ortho Exam  Specialty Comments:  No specialty comments available.  Imaging: No results found.   PMFS History: Patient Active Problem List   Diagnosis Date Noted  . Shoulder arthritis 02/22/2018  . Primary osteoarthritis, left shoulder 12/11/2017  . DDD (degenerative disc disease), lumbar 01/23/2017  . History of diabetes mellitus 01/23/2017  . History of depression 01/23/2017  . History of asthma 01/23/2017  . Dyslipidemia 01/23/2017  . Age-related osteoporosis without current pathological fracture 01/23/2017  .  History of gastroesophageal reflux (GERD) 01/17/2017  . History of hypertension 01/17/2017  . Primary osteoarthritis of both knees 01/17/2017  . Primary osteoarthritis of both feet 01/17/2017  . Vitamin D deficiency 01/17/2017  . Rheumatoid factor positive 12/13/2016  . Primary osteoarthritis of both hands 12/13/2016  . Obstructive lung disease ([place]) 07/29/2015  . H/O allergic rhinitis 07/29/2015  . LPRD (laryngopharyngeal reflux disease) 07/29/2015  . Chest pain 11/01/2013  . Murmur 11/01/2013  . DM 03/03/2010  . ABDOMINAL PAIN RIGHT LOWER QUADRANT 03/03/2010  . FULL INCONTINENCE OF FECES 03/03/2010  . DECREASED HEARING 05/02/2008  . HYPERTENSION 05/02/2008  . ESOPHAGITIS 05/02/2008  . HIATAL HERNIA 05/02/2008  . RECTAL INCONTINENCE 05/02/2008  . SARCOIDOSIS 11/16/2007  . Severe persistent asthma 11/16/2007  . GERD 11/16/2007  . IRRITABLE BOWEL SYNDROME 11/16/2007   Past Medical History:  Diagnosis Date  . Allergy   . Anxiety   . Arthritis   . Asthma   . Cataract    bilateral and removed  . Depression   . Diaphragmatic hernia without mention of obstruction or gangrene   . DM (diabetes mellitus) ([place])   . Dyspnea   . Esophageal reflux   . Esophagitis, unspecified   . H/O adenomatous polyp of colon 2011  . Heart murmur    no cardiologist, family Dr. Maryfrances Bunnell  . History of kidney stones   . Hyperlipidemia   . Incontinence of feces   . Irritable bowel syndrome   . Kidney stones   . Sarcoidosis   . Sleep apnea  no cpap now  . Status post dilation of esophageal narrowing   . Unspecified asthma(493.90)   . Unspecified essential hypertension   . Unspecified hearing loss     Family History  Problem Relation Age of Onset  . Esophageal cancer Father   . Diabetes Mother   . Diabetes Maternal Grandmother   . Colon cancer Neg Hx   . Rectal cancer Neg Hx   . Stomach cancer Neg Hx     Past Surgical History:  Procedure Laterality Date  . BLADDER SURGERY    .  CATARACT EXTRACTION Bilateral   . HARDWARE REMOVAL Left 01/12/2017   Procedure: HARDWARE REMOVAL left wrist;  Surgeon: Leanora Cover, MD;  Location: [facility];  Service: Orthopedics;  Laterality: Left;  . HEMORRHOID SURGERY    . NASAL SEPTUM SURGERY    . PARTIAL HYSTERECTOMY    . REVERSE SHOULDER ARTHROPLASTY Left 02/22/2018  . REVERSE SHOULDER ARTHROPLASTY Left 02/22/2018   Procedure: LEFT REVERSE SHOULDER ARTHROPLASTY;  Surgeon: Meredith Pel, MD;  Location: [facility];  Service: Orthopedics;  Laterality: Left;  . TONSILLECTOMY AND ADENOIDECTOMY    . TRIGGER FINGER RELEASE Left 05/12/2016   Procedure: RELEASE TRIGGER FINGER/A-1 PULLEY INFECTION LEFT RING TENDON SHEATH INJECT RIGHT INDEX FINGER;  Surgeon: Leanora Cover, MD;  Location: [facility];  Service: Orthopedics;  Laterality: Left;  . TUBAL LIGATION     Social History   Occupational History  . Occupation: retired  Tobacco Use  . Smoking status: Never Smoker  . Smokeless tobacco: Never Used  Substance and Sexual Activity  . Alcohol use: No  . Drug use: No  . Sexual activity: Not on file

## 2018-06-29 DIAGNOSIS — H906 Mixed conductive and sensorineural hearing loss, bilateral: Secondary | ICD-10-CM | POA: Diagnosis not present

## 2018-07-03 DIAGNOSIS — J4540 Moderate persistent asthma, uncomplicated: Secondary | ICD-10-CM

## 2018-07-03 DIAGNOSIS — N390 Urinary tract infection, site not specified: Secondary | ICD-10-CM | POA: Diagnosis not present

## 2018-07-04 ENCOUNTER — Ambulatory Visit (INDEPENDENT_AMBULATORY_CARE_PROVIDER_SITE_OTHER): Payer: Medicare Other

## 2018-07-04 ENCOUNTER — Telehealth (INDEPENDENT_AMBULATORY_CARE_PROVIDER_SITE_OTHER): Payer: Self-pay

## 2018-07-04 DIAGNOSIS — J4540 Moderate persistent asthma, uncomplicated: Secondary | ICD-10-CM

## 2018-07-04 NOTE — Telephone Encounter (Signed)
Submitted for VOB, Monovisc, bilateral knee.

## 2018-07-17 ENCOUNTER — Telehealth (INDEPENDENT_AMBULATORY_CARE_PROVIDER_SITE_OTHER): Payer: Self-pay

## 2018-07-17 NOTE — Telephone Encounter (Signed)
I spoke with patient about scheduling appt for Monovisc injections. Patient is not ready to schedule at this time, and will let us know if she is ready to schedule in the future.

## 2018-07-17 NOTE — Telephone Encounter (Signed)
Patient is approved for Monovisc, bilateral knee. [place] Patient will be responsible for 20% OOP No Co-pay No PA required  Please schedule patient appointment with Dr. Durward Fortes. Thank You.

## 2018-07-19 DIAGNOSIS — N201 Calculus of ureter: Secondary | ICD-10-CM | POA: Diagnosis not present

## 2018-07-19 DIAGNOSIS — N3946 Mixed incontinence: Secondary | ICD-10-CM | POA: Diagnosis not present

## 2018-07-19 DIAGNOSIS — R350 Frequency of micturition: Secondary | ICD-10-CM | POA: Diagnosis not present

## 2018-07-19 DIAGNOSIS — R351 Nocturia: Secondary | ICD-10-CM | POA: Diagnosis not present

## 2018-07-25 ENCOUNTER — Ambulatory Visit: Payer: Medicare Other | Admitting: Rheumatology

## 2018-07-31 DIAGNOSIS — J4540 Moderate persistent asthma, uncomplicated: Secondary | ICD-10-CM | POA: Diagnosis not present

## 2018-08-01 ENCOUNTER — Ambulatory Visit (INDEPENDENT_AMBULATORY_CARE_PROVIDER_SITE_OTHER): Payer: Medicare Other | Admitting: *Deleted

## 2018-08-01 DIAGNOSIS — R350 Frequency of micturition: Secondary | ICD-10-CM | POA: Diagnosis not present

## 2018-08-01 DIAGNOSIS — J4540 Moderate persistent asthma, uncomplicated: Secondary | ICD-10-CM | POA: Diagnosis not present

## 2018-08-01 DIAGNOSIS — R351 Nocturia: Secondary | ICD-10-CM | POA: Diagnosis not present

## 2018-08-01 DIAGNOSIS — N3946 Mixed incontinence: Secondary | ICD-10-CM | POA: Diagnosis not present

## 2018-08-06 ENCOUNTER — Ambulatory Visit (INDEPENDENT_AMBULATORY_CARE_PROVIDER_SITE_OTHER): Payer: Medicare Other | Admitting: Allergy and Immunology

## 2018-08-06 VITALS — BP 142/54 | HR 60 | Resp 20

## 2018-08-06 DIAGNOSIS — J449 Chronic obstructive pulmonary disease, unspecified: Secondary | ICD-10-CM

## 2018-08-06 DIAGNOSIS — K219 Gastro-esophageal reflux disease without esophagitis: Secondary | ICD-10-CM

## 2018-08-06 DIAGNOSIS — J3089 Other allergic rhinitis: Secondary | ICD-10-CM

## 2018-08-06 MED ORDER — IPRATROPIUM BROMIDE 0.06 % NA SOLN: NASAL | 5 refills | Status: DC

## 2018-08-06 MED ORDER — RANITIDINE HCL 300 MG PO TABS: 300.0000 mg | ORAL_TABLET | Freq: Every day | ORAL | 5 refills | Status: DC

## 2018-08-06 MED ORDER — METHYLPREDNISOLONE ACETATE 80 MG/ML IJ SUSP
80.0000 mg | Freq: Once | INTRAMUSCULAR | Status: AC
  Administered 2018-08-06: 80 mg via INTRAMUSCULAR

## 2018-08-06 NOTE — Progress Notes (Signed)
Follow-up Note  Referring Provider: Myer Peer, MD Primary Provider: Myer Peer, MD Date of Office Visit: 08/06/2018  Subjective:   [name] (DOB: September 22, 1946) is a 72 y.o. female who returns to the [facility] on 08/06/2018 in re-evaluation of the following:  HPI: [name] returns to this clinic in reevaluation of her COPD with component of asthma, allergic rhinitis, and LPR.  Her last visit to this clinic was 29 January 2018.  She still continues to have some issues with intermittent hoarseness and wheezing and coughing.  This has been a little bit more prevalent over the course of the past week.  She has been using a cough drop on occasion which helps sometimes.  She has been using a short acting bronchodilator which helps sometimes.  She still has very significant dyspnea on exertion and still is completely out of breath if she exerts herself to any large degree.  In investigation of that issue we did obtain a CBC in investigation of anemia, nocturnal oximetry in investigation of nocturnal hypoxemia, and a chest x-ray which identified predominantly emphysema.  Her reflux is still an active issue.  She still gets a fullness in her chest and burping.  She wakes up in the morning to clear out her throat.  Occasionally she will get nauseated when she is trying to clear out her throat.  She has not been using her ranitidine but does continue on a proton pump inhibitor.  Allergies as of 08/06/2018      Reactions   Codeine Nausea And Vomiting   Sulfa Antibiotics Other (See Comments)   Allergic per pt's mother       Medication List      albuterol 108 (90 Base) MCG/ACT inhaler Commonly known as:  PROVENTIL HFA;VENTOLIN HFA INHALE 2 PUFFS 4 TIMES A DAY AS NEEDED   albuterol (2.5 MG/3ML) 0.083% nebulizer solution Commonly known as:  PROVENTIL Take 3 mLs (2.5 mg total) by nebulization every 4 (four) hours as needed for wheezing or shortness of breath.     amLODipine 5 MG tablet Commonly known as:  NORVASC Take 5 mg by mouth daily.   aspirin EC 81 MG tablet Take 81 mg by mouth daily.   atorvastatin 20 MG tablet Commonly known as:  LIPITOR Take 20 mg by mouth every evening.   beclomethasone 80 MCG/ACT inhaler Commonly known as:  QVAR Inhale two doses twice daily during flare-up.  Rinse, gargle, and spit after use.   cholecalciferol 1000 units tablet Commonly known as:  VITAMIN D Take 1,000 Units by mouth daily.   citalopram 20 MG tablet Commonly known as:  CELEXA Take 20 mg by mouth daily.   dicyclomine 10 MG capsule Commonly known as:  BENTYL Take 10 mg by mouth 2 (two) times daily.   DULERA 200-5 MCG/ACT Aero Generic drug:  mometasone-formoterol Inhale 2 puffs into the lungs 2 (two) times daily.   EPINEPHrine 0.3 mg/0.3 mL Soaj injection Commonly known as:  EPI-PEN USE AS DIRECTED FOR LIFE THREATENING ALLERGIC REACTION   fexofenadine 180 MG tablet Commonly known as:  ALLEGRA Take 180 mg by mouth daily as needed for allergies or rhinitis.   fluticasone 50 MCG/ACT nasal spray Commonly known as:  FLONASE Use one spray in each nostril once or twice daily   ICAPS AREDS 2 PO Take 1 capsule by mouth 2 (two) times daily.   ipratropium 0.06 % nasal spray Commonly known as:  ATROVENT Use two sprays in  each nostril every 6 hours if needed for runny nose.   ipratropium-albuterol 0.5-2.5 (3) MG/3ML Soln Commonly known as:  DUONEB Take 3 mLs by nebulization every 4 (four) hours as needed (shortness of breath).   omalizumab 150 MG injection Commonly known as:  XOLAIR Inject 150 mg into the skin every 28 (twenty-eight) days.   omeprazole 40 MG capsule Commonly known as:  PRILOSEC TAKE 1 CAPSULE BY MOUTH TWICE A DAY   pioglitazone 30 MG tablet Commonly known as:  ACTOS Take 30 mg by mouth daily.   PROBIOTIC DAILY PO Take 1 capsule by mouth at bedtime.   telmisartan 80 MG tablet Commonly known as:  MICARDIS Take  80 mg by mouth daily.   VICTOZA 18 MG/3ML Sopn Generic drug:  liraglutide Inject 0.6 mg into the skin daily.   VISINE OP Place 1 drop into both eyes daily as needed (dry eyes).   Vitamin B-12 5000 MCG Lozg Take 5,000 mcg by mouth daily.       Past Medical History:  Diagnosis Date  . Allergy   . Anxiety   . Arthritis   . Asthma   . Cataract    bilateral and removed  . Depression   . Diaphragmatic hernia without mention of obstruction or gangrene   . DM (diabetes mellitus) ([place])   . Dyspnea   . Esophageal reflux   . Esophagitis, unspecified   . H/O adenomatous polyp of colon 2011  . Heart murmur    no cardiologist, family Dr. Maryfrances Bunnell  . History of kidney stones   . Hyperlipidemia   . Incontinence of feces   . Irritable bowel syndrome   . Kidney stones   . Sarcoidosis   . Sleep apnea    no cpap now  . Status post dilation of esophageal narrowing   . Unspecified asthma(493.90)   . Unspecified essential hypertension   . Unspecified hearing loss     Past Surgical History:  Procedure Laterality Date  . BLADDER SURGERY    . CATARACT EXTRACTION Bilateral   . HARDWARE REMOVAL Left 01/12/2017   Procedure: HARDWARE REMOVAL left wrist;  Surgeon: Leanora Cover, MD;  Location: [facility];  Service: Orthopedics;  Laterality: Left;  . HEMORRHOID SURGERY    . NASAL SEPTUM SURGERY    . PARTIAL HYSTERECTOMY    . REVERSE SHOULDER ARTHROPLASTY Left 02/22/2018  . REVERSE SHOULDER ARTHROPLASTY Left 02/22/2018   Procedure: LEFT REVERSE SHOULDER ARTHROPLASTY;  Surgeon: Meredith Pel, MD;  Location: [facility];  Service: Orthopedics;  Laterality: Left;  . TONSILLECTOMY AND ADENOIDECTOMY    . TRIGGER FINGER RELEASE Left 05/12/2016   Procedure: RELEASE TRIGGER FINGER/A-1 PULLEY INFECTION LEFT RING TENDON SHEATH INJECT RIGHT INDEX FINGER;  Surgeon: Leanora Cover, MD;  Location: [facility];  Service: Orthopedics;  Laterality: Left;  . TUBAL LIGATION       Review of systems negative except as noted in HPI / PMHx or noted below:  Review of Systems  Constitutional: Negative.   HENT: Negative.   Eyes: Negative.   Respiratory: Negative.   Cardiovascular: Negative.   Gastrointestinal: Negative.   Genitourinary: Negative.   Musculoskeletal: Negative.   Skin: Negative.   Neurological: Negative.   Endo/Heme/Allergies: Negative.   Psychiatric/Behavioral: Negative.      Objective:   Vitals:   08/06/18 1623  BP: (!) 142/54  Pulse: 60  Resp: 20  SpO2: 96%          Physical Exam  Constitutional:  Raspy  voice  HENT:  Head: Normocephalic.  Right Ear: Tympanic membrane, external ear and ear canal normal.  Left Ear: Tympanic membrane, external ear and ear canal normal.  Nose: Nose normal. No mucosal edema or rhinorrhea.  Mouth/Throat: Uvula is midline, oropharynx is clear and moist and mucous membranes are normal. No oropharyngeal exudate.  Eyes: Conjunctivae are normal.  Neck: Trachea normal. No tracheal tenderness present. No tracheal deviation present. No thyromegaly present.  Cardiovascular: Normal rate, regular rhythm, S1 normal, S2 normal and normal heart sounds.  No murmur heard. Pulmonary/Chest: Breath sounds normal. No stridor. No respiratory distress. She has no wheezes. She has no rales.  Musculoskeletal: She exhibits no edema.  Lymphadenopathy:       Head (right side): No tonsillar adenopathy present.       Head (left side): No tonsillar adenopathy present.    She has no cervical adenopathy.  Neurological: She is alert.  Skin: No rash noted. She is not diaphoretic. No erythema. Nails show no clubbing.    Diagnostics:    Spirometry was performed and demonstrated an FEV1 of 0.79 at 42 % of predicted.  The patient had an Asthma Control Test with the following results: ACT Total Score: 10.    Results of nocturnal oximetry study obtained 02 February 2018 identified approximately 1% of sleep time with an oxygen  saturation below 88%.  Her oxygen saturation was greater than 90% during 98.3% of sleep time.  Results of a chest x-ray obtained for March 2019 identified emesis edematous hyperinflation of the lungs left greater than right with a trace bilateral pleural effusion.  Results of blood tests obtained for March 2019 identified WBC 7.8, absolute eosinophil below 100, absolute lymphocyte 700, hemoglobin 11.2, platelet 281  Assessment and Plan:   1. COPD with asthma ([place])   2. Other allergic rhinitis   3. LPRD (laryngopharyngeal reflux disease)     1. Continue Xolair and Epi-Pen  2. Continue Dulera 200 two inhalations two times per day with spacer  3. Add SAMPLES of Qvar 80 REDIHALER two inhalations two times per day to [facility] during "flare up".  4. Continue Flonase - one spray each nostril 1-2 times per day  5. Continue omeprazole 40 twice a day + ranitidine 300 in evening  6. Continue nasal saline, ProAir HFA, Duoneb if needed.  7. Can add OTC Loratadine / Claritin 10 mg one tablet 1-2 times per day if needed  8. Can add OTC Mucinex DM 1-2 tablet 1-2 times per day if needed  9. Can add ipratropium 0.06% nasal spray - 1-2 sprays each nostril every 6 hours if needed  10. Depomedrol 80 IM delivered in clinic today. Blood sugars?  11. Obtain fall flu vaccine  12. Return to clinic in 12 weeks or earlier if problem  I have given Mabry a systemic steroid today to help with any inflammation of her airway that may be contributing to her respiratory tract symptoms.  I did have a talk with her today about the fact that this will probably raise her blood sugars somewhat in the near future.  She will continue on a large collection of therapy directed against respiratory tract inflammation and I have encouraged her to consistently use her ranitidine in addition to her omeprazole as she definitely has a component of reflux induced respiratory disease.  I will see her back in this clinic in 12 weeks  or earlier if there is a problem.  It should be noted that she does have a  distant history of sarcoidosis requiring systemic steroid therapy in the past and although this does not appear to be an active issue at this point she probably has some architectural changes in her lung as a result of that inflammatory process giving rise to her physiology consistent with COPD.  Allena Katz, MD Allergy / Immunology [place]

## 2018-08-06 NOTE — Patient Instructions (Addendum)
  1. Continue Xolair and Epi-Pen  2. Continue Dulera 200 two inhalations two times per day with spacer  3. Add SAMPLES of Qvar 80 REDIHALER two inhalations two times per day to [facility] during "flare up".  4. Continue Flonase - one spray each nostril 1-2 times per day  5. Continue omeprazole 40 twice a day + ranitidine 300 in evening  6. Continue nasal saline, ProAir HFA, Duoneb if needed.  7. Can add OTC Loratadine / Claritin 10 mg one tablet 1-2 times per day if needed  8. Can add OTC Mucinex DM 1-2 tablet 1-2 times per day if needed  9. Can add ipratropium 0.06% nasal spray - 1-2 sprays each nostril every 6 hours if needed  10. Depomedrol 80 IM delivered in clinic today. Blood sugars?  11. Obtain fall flu vaccine  12. Return to clinic in 12 weeks or earlier if problem

## 2018-08-07 ENCOUNTER — Encounter: Payer: Self-pay | Admitting: Allergy and Immunology

## 2018-08-09 DIAGNOSIS — H90A31 Mixed conductive and sensorineural hearing loss, unilateral, right ear with restricted hearing on the contralateral side: Secondary | ICD-10-CM | POA: Diagnosis not present

## 2018-08-09 DIAGNOSIS — H90A22 Sensorineural hearing loss, unilateral, left ear, with restricted hearing on the contralateral side: Secondary | ICD-10-CM | POA: Diagnosis not present

## 2018-08-10 DIAGNOSIS — R351 Nocturia: Secondary | ICD-10-CM | POA: Diagnosis not present

## 2018-08-10 DIAGNOSIS — N3946 Mixed incontinence: Secondary | ICD-10-CM | POA: Diagnosis not present

## 2018-08-11 ENCOUNTER — Other Ambulatory Visit: Payer: Self-pay | Admitting: Allergy and Immunology

## 2018-08-13 ENCOUNTER — Ambulatory Visit (INDEPENDENT_AMBULATORY_CARE_PROVIDER_SITE_OTHER): Payer: Medicare Other | Admitting: Podiatry

## 2018-08-13 ENCOUNTER — Encounter: Payer: Self-pay | Admitting: Podiatry

## 2018-08-13 VITALS — BP 116/46 | HR 79 | Resp 15

## 2018-08-13 DIAGNOSIS — B351 Tinea unguium: Secondary | ICD-10-CM | POA: Diagnosis not present

## 2018-08-13 DIAGNOSIS — E1151 Type 2 diabetes mellitus with diabetic peripheral angiopathy without gangrene: Secondary | ICD-10-CM

## 2018-08-13 NOTE — Progress Notes (Signed)
Subjective:  Patient ID: [name], female    DOB: 01-15-1946,  MRN: [id number]  Chief Complaint  Patient presents with  . debride    BL nail trimming -FBS:131 A1C:"IDK" JHE:RDEYC LOV: x 6 wks    72 y.o. female presents  for diabetic foot care. Last AMBS was 131. Reports numbness and tingling in their feet. Reports cramping in legs and thighs.  Review of Systems: Negative except as noted in the HPI. Denies N/V/F/Ch.  Past Medical History:  Diagnosis Date  . Allergy   . Anxiety   . Arthritis   . Asthma   . Cataract    bilateral and removed  . Depression   . Diaphragmatic hernia without mention of obstruction or gangrene   . DM (diabetes mellitus) ([place])   . Dyspnea   . Esophageal reflux   . Esophagitis, unspecified   . H/O adenomatous polyp of colon 2011  . Heart murmur    no cardiologist, family Dr. Maryfrances Bunnell  . History of kidney stones   . Hyperlipidemia   . Incontinence of feces   . Irritable bowel syndrome   . Kidney stones   . Sarcoidosis   . Sleep apnea    no cpap now  . Status post dilation of esophageal narrowing   . Unspecified asthma(493.90)   . Unspecified essential hypertension   . Unspecified hearing loss     Current Outpatient Medications:  .  albuterol (PROVENTIL HFA;VENTOLIN HFA) 108 (90 Base) MCG/ACT inhaler, INHALE 2 PUFFS 4 TIMES A DAY AS NEEDED, Disp: 18 Inhaler, Rfl: 1 .  albuterol (PROVENTIL) (2.5 MG/3ML) 0.083% nebulizer solution, Take 3 mLs (2.5 mg total) by nebulization every 4 (four) hours as needed for wheezing or shortness of breath., Disp: 50 vial, Rfl: 1 .  amLODipine (NORVASC) 5 MG tablet, Take 5 mg by mouth daily., Disp: , Rfl: 2 .  aspirin EC 81 MG tablet, Take 81 mg by mouth daily., Disp: , Rfl:  .  atorvastatin (LIPITOR) 20 MG tablet, Take 20 mg by mouth every evening. , Disp: , Rfl:  .  beclomethasone (QVAR REDIHALER) 80 MCG/ACT inhaler, Inhale two doses twice daily during flare-up.  Rinse, gargle, and spit after use.,  Disp: 1 Inhaler, Rfl: 0 .  cholecalciferol (VITAMIN D) 1000 units tablet, Take 1,000 Units by mouth daily., Disp: , Rfl:  .  citalopram (CELEXA) 20 MG tablet, Take 20 mg by mouth daily.  , Disp: , Rfl:  .  Cyanocobalamin (VITAMIN B-12) 5000 MCG LOZG, Take 5,000 mcg by mouth daily., Disp: , Rfl:  .  dicyclomine (BENTYL) 10 MG capsule, Take 10 mg by mouth 2 (two) times daily. , Disp: , Rfl:  .  EPINEPHrine 0.3 mg/0.3 mL IJ SOAJ injection, USE AS DIRECTED FOR LIFE THREATENING ALLERGIC REACTION, Disp: , Rfl:  .  fexofenadine (ALLEGRA) 180 MG tablet, Take 180 mg by mouth daily as needed for allergies or rhinitis., Disp: , Rfl:  .  fluticasone (FLONASE) 50 MCG/ACT nasal spray, Use one spray in each nostril once or twice daily (Patient taking differently: Place 1 spray into both nostrils daily. ), Disp: 16 g, Rfl: 5 .  ipratropium (ATROVENT) 0.06 % nasal spray, Can use one to two sprays in each nostril every six hours if needed to dry up runny nose., Disp: 15 mL, Rfl: 5 .  ipratropium-albuterol (DUONEB) 0.5-2.5 (3) MG/3ML SOLN, Take 3 mLs by nebulization every 4 (four) hours as needed (shortness of breath). , Disp: , Rfl:  .  liraglutide (  VICTOZA) 18 MG/3ML SOPN, Inject 0.6 mg into the skin daily. , Disp: , Rfl:  .  mometasone-formoterol (DULERA) 200-5 MCG/ACT AERO, Inhale 2 puffs into the lungs 2 (two) times daily., Disp: , Rfl:  .  Multiple Vitamins-Minerals (ICAPS AREDS 2 PO), Take 1 capsule by mouth 2 (two) times daily., Disp: , Rfl:  .  omalizumab (XOLAIR) 150 MG injection, Inject 150 mg into the skin every 28 (twenty-eight) days. , Disp: , Rfl:  .  omeprazole (PRILOSEC) 40 MG capsule, TAKE 1 CAPSULE BY MOUTH TWICE A DAY, Disp: 60 capsule, Rfl: 4 .  pioglitazone (ACTOS) 30 MG tablet, Take 30 mg by mouth daily., Disp: , Rfl:  .  Probiotic Product (PROBIOTIC DAILY PO), Take 1 capsule by mouth at bedtime. , Disp: , Rfl:  .  ranitidine (ZANTAC) 300 MG tablet, Take 1 tablet (300 mg total) by mouth at  bedtime., Disp: 30 tablet, Rfl: 5 .  telmisartan (MICARDIS) 80 MG tablet, Take 80 mg by mouth daily., Disp: , Rfl: 9 .  Tetrahydrozoline HCl (VISINE OP), Place 1 drop into both eyes daily as needed (dry eyes)., Disp: , Rfl:   Current Facility-Administered Medications:  .  omalizumab Arvid Right) injection 150 mg, 150 mg, Subcutaneous, Q28 days, Kozlow, Donnamarie Poag, MD, 150 mg at 08/01/18 1102  Social History   Tobacco Use  Smoking Status Never Smoker  Smokeless Tobacco Never Used    Allergies  Allergen Reactions  . Codeine Nausea And Vomiting  . Sulfa Antibiotics Other (See Comments)    Allergic per pt's mother    Objective:   Vitals:   08/13/18 1008  BP: (!) 116/46  Pulse: 79  Resp: 15   There is no height or weight on file to calculate BMI. Constitutional Well developed. Well nourished.  Vascular Dorsalis pedis pulses present 1+ bilaterally  Posterior tibial pulses absent bilaterally  Pedal hair growth diminished. Capillary refill normal to all digits.  No cyanosis or clubbing noted.  Neurologic Normal speech. Oriented to person, place, and time. Epicritic sensation to light touch grossly present bilaterally. Protective sensation with 5.07 monofilament  present bilaterally. Vibratory sensation present bilaterally.  Dermatologic Nails elongated, thickened, dystrophic. No open wounds. No skin lesions.  Orthopedic: Normal joint ROM without pain or crepitus bilaterally. No visible deformities. No bony tenderness.   Assessment:   1. Diabetes mellitus type 2 with peripheral artery disease ([place])   2. Onychomycosis    Plan:  Patient was evaluated and treated and all questions answered.  Diabetes with PAD, Onychomycosis -Educated on diabetic footcare. Diabetic risk level 1 -Nails x10 debrided sharply and manually with large nail nipper and rotary burr.   Procedure: Nail Debridement Rationale: Patient meets criteria for routine foot care due to PAD Type of Debridement:  manual, sharp debridement. Instrumentation: Nail nipper, rotary burr. Number of Nails: 10   Return in about 2 months (around 10/14/2018) for Diabetic Foot Care.

## 2018-08-22 ENCOUNTER — Other Ambulatory Visit: Payer: Self-pay | Admitting: Allergy and Immunology

## 2018-08-27 DIAGNOSIS — Z23 Encounter for immunization: Secondary | ICD-10-CM | POA: Diagnosis not present

## 2018-08-28 DIAGNOSIS — J4540 Moderate persistent asthma, uncomplicated: Secondary | ICD-10-CM

## 2018-08-29 ENCOUNTER — Ambulatory Visit (INDEPENDENT_AMBULATORY_CARE_PROVIDER_SITE_OTHER): Payer: Medicare Other

## 2018-08-29 DIAGNOSIS — J4540 Moderate persistent asthma, uncomplicated: Secondary | ICD-10-CM | POA: Diagnosis not present

## 2018-08-31 DIAGNOSIS — G4733 Obstructive sleep apnea (adult) (pediatric): Secondary | ICD-10-CM | POA: Diagnosis not present

## 2018-08-31 DIAGNOSIS — J40 Bronchitis, not specified as acute or chronic: Secondary | ICD-10-CM | POA: Diagnosis not present

## 2018-08-31 DIAGNOSIS — J329 Chronic sinusitis, unspecified: Secondary | ICD-10-CM | POA: Diagnosis not present

## 2018-08-31 DIAGNOSIS — J4541 Moderate persistent asthma with (acute) exacerbation: Secondary | ICD-10-CM | POA: Diagnosis not present

## 2018-09-04 DIAGNOSIS — E1122 Type 2 diabetes mellitus with diabetic chronic kidney disease: Secondary | ICD-10-CM | POA: Diagnosis not present

## 2018-09-06 DIAGNOSIS — Z6830 Body mass index (BMI) 30.0-30.9, adult: Secondary | ICD-10-CM | POA: Diagnosis not present

## 2018-09-06 DIAGNOSIS — Z1331 Encounter for screening for depression: Secondary | ICD-10-CM | POA: Diagnosis not present

## 2018-09-06 DIAGNOSIS — E1122 Type 2 diabetes mellitus with diabetic chronic kidney disease: Secondary | ICD-10-CM | POA: Diagnosis not present

## 2018-09-06 DIAGNOSIS — Z9181 History of falling: Secondary | ICD-10-CM | POA: Diagnosis not present

## 2018-09-10 DIAGNOSIS — H90A22 Sensorineural hearing loss, unilateral, left ear, with restricted hearing on the contralateral side: Secondary | ICD-10-CM | POA: Diagnosis not present

## 2018-09-10 DIAGNOSIS — H8093 Unspecified otosclerosis, bilateral: Secondary | ICD-10-CM | POA: Diagnosis not present

## 2018-09-10 DIAGNOSIS — H90A31 Mixed conductive and sensorineural hearing loss, unilateral, right ear with restricted hearing on the contralateral side: Secondary | ICD-10-CM | POA: Diagnosis not present

## 2018-09-11 DIAGNOSIS — N3946 Mixed incontinence: Secondary | ICD-10-CM | POA: Diagnosis not present

## 2018-09-11 DIAGNOSIS — R350 Frequency of micturition: Secondary | ICD-10-CM | POA: Diagnosis not present

## 2018-09-19 ENCOUNTER — Encounter: Payer: Self-pay | Admitting: Pulmonary Disease

## 2018-09-19 ENCOUNTER — Ambulatory Visit (INDEPENDENT_AMBULATORY_CARE_PROVIDER_SITE_OTHER): Payer: Medicare Other | Admitting: Pulmonary Disease

## 2018-09-19 VITALS — BP 130/62 | HR 75 | Ht 62.0 in | Wt 165.4 lb

## 2018-09-19 DIAGNOSIS — J449 Chronic obstructive pulmonary disease, unspecified: Secondary | ICD-10-CM | POA: Diagnosis not present

## 2018-09-19 DIAGNOSIS — D869 Sarcoidosis, unspecified: Secondary | ICD-10-CM

## 2018-09-19 DIAGNOSIS — G4733 Obstructive sleep apnea (adult) (pediatric): Secondary | ICD-10-CM

## 2018-09-19 NOTE — Progress Notes (Signed)
   Subjective:    Patient ID: [name], female    DOB: 1946-10-12, 72 y.o.   MRN: [id number]  HPI    Review of Systems  Constitutional: Negative for fever and unexpected weight change.  HENT: Negative for congestion, dental problem, ear pain, nosebleeds, postnasal drip, rhinorrhea, sinus pressure, sneezing, sore throat and trouble swallowing.   Eyes: Negative for redness and itching.  Respiratory: Positive for cough, chest tightness, shortness of breath and wheezing.   Cardiovascular: Positive for leg swelling. Negative for palpitations.  Gastrointestinal: Negative for nausea and vomiting.  Genitourinary: Negative for dysuria.  Musculoskeletal: Positive for joint swelling.  Skin: Negative for rash.  Allergic/Immunologic: Positive for environmental allergies. Negative for food allergies and immunocompromised state.  Neurological: Negative for headaches.  Hematological: Does not bruise/bleed easily.  Psychiatric/Behavioral: Negative for dysphoric mood. The patient is nervous/anxious.        Objective:   Physical Exam        Assessment & Plan:

## 2018-09-19 NOTE — Patient Instructions (Signed)
Will arrange for home sleep study, pulmonary function test, and high resolution CT chest  Follow up in 4 to 6 weeks

## 2018-09-19 NOTE — Progress Notes (Signed)
[facility]  Chief Complaint  Patient presents with  . sleep consult    Pt referred by Dr. Ann Held MD. Pt wakes up not rested, and takes several naps during the day.     Constitutional:  BP 130/62 (BP Location: Left Arm, Cuff Size: Normal)   Pulse 75   Ht 5\' 2"  (1.575 m)   Wt 165 lb 6.4 oz (75 kg)   SpO2 97%   BMI 30.25 kg/m   Past Medical History:  Allergies, Anxiety, Depression, Hiatal hernia, DM, GERD, Colon polyp, Nephrolithiasis, HLD, IBS, Sarcoidosis, HTN  Brief Summary:  [name] is a 72 y.o. female with obstructive sleep apnea, COPD with asthma, and sarcoidosis.  She was dx with sarcoidosis after bronchoscopy with biopsy in 2002.  It is unclear what the status of this is.  She is followed by Dr. Neldon Mc for allergic asthma on xolair.  She was told recently that she has COPD and might have emphysema also.  She had a sleep study about 7 yrs ago and was found to have sleep apnea.  She was using CPAP and this helped.  She was worried about her hair getting messed up from CPAP straps and stopped using.    She has been getting more trouble with her sleep.  She snores and stops breathing at night.  Her husband has to sleep in a separate room.  She wakes up several times to use the bathroom.  She gets coughing spells at night and these persist through the day.  She will gets some wheeze and chest tightness.  She doesn't always bring up phlegm, but this is usually clear.  No skin rashes, or joint swelling.  She gets winded when walking too fast.  She denies sleep walking, sleep talking, bruxism, or nightmares.  There is no history of restless legs.  She denies sleep hallucinations, sleep paralysis, or cataplexy.  The Epworth score is 8 out of 24.     Physical Exam:   Appearance - well kempt  ENMT - clear nasal mucosa, midline nasal septum, no oral exudates, no LAN, trachea midline, overbite, MP 4, triangular uvula, narrow nasal  passages Respiratory - normal chest wall, normal respiratory effort, no accessory muscle use, no wheeze/rales CV - s1s2 regular rate and rhythm, no murmurs, no peripheral edema, radial pulses symmetric GI - soft, non tender, no masses Lymph - no adenopathy noted in neck and axillary areas MSK - normal muscle strength and tone, normal gait Ext - no cyanosis, clubbing, or joint inflammation noted Skin - no rashes, lesions, or ulcers Neuro - oriented to person, place, and time Psych - normal mood and affect  Discussion:  She has history of pulmonary sarcoidosis, COPD with asthma, and sleep apnea.  There is also a question about whether she has emphysema.  She denies smoking history.    Assessment/Plan:   Obstructive sleep apnea. - will arrange for home sleep study  COPD with asthma and possible emphysema. - will arrange for PFT and HRCT chest to further assess - she is followed by Dr. Neldon Mc for xolair - maintained on dulera  History of pulmonary sarcoidosis. - PFT and HRCT chest to assess current status    Patient Instructions  Will arrange for home sleep study, pulmonary function test, and high resolution CT chest  Follow up in 4 to 6 weeks     Chesley Mires, MD [place] Pager: [phone] 09/19/2018, 11:34 AM  Flow Sheet  Pulmonary tests:  Lymph node bx 04/27/01 >> non-caseating granuloma Spirometry 08/06/18 >> FEV1 0.79 (42%), FEV1% 65  Sleep tests:    Cardiac tests:  Echo 01/12/18 >> EF 55 to 60%, grade 1 DD, mild AS/AR, mild MR, PAS 49 mmHg   Review of Systems:  Constitutional: Negative for fever and unexpected weight change.  HENT: Negative for congestion, dental problem, ear pain, nosebleeds, postnasal drip, rhinorrhea, sinus pressure, sneezing, sore throat and trouble swallowing.   Eyes: Negative for redness and itching.  Respiratory: Positive for cough, chest tightness, shortness of breath and wheezing.   Cardiovascular:  Positive for leg swelling. Negative for palpitations.  Gastrointestinal: Negative for nausea and vomiting.  Genitourinary: Negative for dysuria.  Musculoskeletal: Positive for joint swelling.  Skin: Negative for rash.  Allergic/Immunologic: Positive for environmental allergies. Negative for food allergies and immunocompromised state.  Neurological: Negative for headaches.  Hematological: Does not bruise/bleed easily.  Psychiatric/Behavioral: Negative for dysphoric mood. The patient is nervous/anxious.    Medications:   Allergies as of 09/19/2018      Reactions   Codeine Nausea And Vomiting   Sulfa Antibiotics Other (See Comments)   Allergic per pt's mother       Medication List        Accurate as of 09/19/18 11:34 AM. Always use your most recent med list.          albuterol (2.5 MG/3ML) 0.083% nebulizer solution Commonly known as:  PROVENTIL Take 3 mLs (2.5 mg total) by nebulization every 4 (four) hours as needed for wheezing or shortness of breath.   albuterol 108 (90 Base) MCG/ACT inhaler Commonly known as:  PROVENTIL HFA;VENTOLIN HFA INHALE 2 PUFFS BY MOUTH 4 TIMES A DAY AS NEEDED   amLODipine 5 MG tablet Commonly known as:  NORVASC Take 5 mg by mouth daily.   aspirin EC 81 MG tablet Take 81 mg by mouth daily.   atorvastatin 20 MG tablet Commonly known as:  LIPITOR Take 20 mg by mouth every evening.   beclomethasone 80 MCG/ACT inhaler Commonly known as:  QVAR Inhale two doses twice daily during flare-up.  Rinse, gargle, and spit after use.   cholecalciferol 1000 units tablet Commonly known as:  VITAMIN D Take 1,000 Units by mouth daily.   citalopram 20 MG tablet Commonly known as:  CELEXA Take 20 mg by mouth daily.   dicyclomine 10 MG capsule Commonly known as:  BENTYL Take 10 mg by mouth 2 (two) times daily.   DULERA 200-5 MCG/ACT Aero Generic drug:  mometasone-formoterol Inhale 2 puffs into the lungs 2 (two) times daily.   EPINEPHrine 0.3 mg/0.3  mL Soaj injection Commonly known as:  EPI-PEN USE AS DIRECTED FOR LIFE THREATENING ALLERGIC REACTION   fexofenadine 180 MG tablet Commonly known as:  ALLEGRA Take 180 mg by mouth daily as needed for allergies or rhinitis.   fluticasone 50 MCG/ACT nasal spray Commonly known as:  FLONASE Use one spray in each nostril once or twice daily   ICAPS AREDS 2 PO Take 1 capsule by mouth 2 (two) times daily.   ipratropium 0.06 % nasal spray Commonly known as:  ATROVENT Can use one to two sprays in each nostril every six hours if needed to dry up runny nose.   ipratropium-albuterol 0.5-2.5 (3) MG/3ML Soln Commonly known as:  DUONEB Take 3 mLs by nebulization every 4 (four) hours as needed (shortness of breath).   omalizumab 150 MG injection Commonly known as:  XOLAIR Inject 150 mg into the skin  every 28 (twenty-eight) days.   omeprazole 40 MG capsule Commonly known as:  PRILOSEC TAKE 1 CAPSULE BY MOUTH TWICE A DAY   pioglitazone 30 MG tablet Commonly known as:  ACTOS Take 30 mg by mouth daily.   PROBIOTIC DAILY PO Take 1 capsule by mouth at bedtime.   ranitidine 300 MG tablet Commonly known as:  ZANTAC Take 1 tablet (300 mg total) by mouth at bedtime.   telmisartan 80 MG tablet Commonly known as:  MICARDIS Take 80 mg by mouth daily.   VICTOZA 18 MG/3ML Sopn Generic drug:  liraglutide Inject 0.6 mg into the skin daily.   VISINE OP Place 1 drop into both eyes daily as needed (dry eyes).   Vitamin B-12 5000 MCG Lozg Take 5,000 mcg by mouth daily.       Past Surgical History:  She  has a past surgical history that includes Partial hysterectomy; Tonsillectomy and adenoidectomy; Nasal septum surgery; Tubal ligation; Hemorrhoid surgery; Bladder surgery; Cataract extraction (Bilateral); Trigger finger release (Left, 05/12/2016); Hardware Removal (Left, 01/12/2017); Reverse shoulder arthroplasty (Left, 02/22/2018); and Reverse shoulder arthroplasty (Left, 02/22/2018).  Family  History:  Her family history includes Diabetes in her maternal grandmother and mother; Esophageal cancer in her father.  Social History:  She  reports that she has never smoked. She has never used smokeless tobacco. She reports that she does not drink alcohol or use drugs.

## 2018-09-25 DIAGNOSIS — J4540 Moderate persistent asthma, uncomplicated: Secondary | ICD-10-CM

## 2018-09-26 ENCOUNTER — Ambulatory Visit (INDEPENDENT_AMBULATORY_CARE_PROVIDER_SITE_OTHER): Payer: Medicare Other | Admitting: *Deleted

## 2018-09-26 DIAGNOSIS — J4540 Moderate persistent asthma, uncomplicated: Secondary | ICD-10-CM

## 2018-09-27 ENCOUNTER — Ambulatory Visit (INDEPENDENT_AMBULATORY_CARE_PROVIDER_SITE_OTHER)
Admission: RE | Admit: 2018-09-27 | Discharge: 2018-09-27 | Disposition: A | Discharge status: Home / Self Care | Payer: Medicare Other | Source: Ambulatory Visit | Attending: Pulmonary Disease | Admitting: Pulmonary Disease

## 2018-09-27 DIAGNOSIS — D869 Sarcoidosis, unspecified: Secondary | ICD-10-CM | POA: Diagnosis not present

## 2018-09-27 DIAGNOSIS — K449 Diaphragmatic hernia without obstruction or gangrene: Secondary | ICD-10-CM | POA: Diagnosis not present

## 2018-09-27 DIAGNOSIS — G4733 Obstructive sleep apnea (adult) (pediatric): Secondary | ICD-10-CM | POA: Diagnosis not present

## 2018-09-28 ENCOUNTER — Telehealth: Payer: Self-pay | Admitting: Pulmonary Disease

## 2018-09-28 ENCOUNTER — Other Ambulatory Visit: Payer: Self-pay | Admitting: *Deleted

## 2018-09-28 ENCOUNTER — Encounter: Payer: Self-pay | Admitting: Pulmonary Disease

## 2018-09-28 DIAGNOSIS — G4733 Obstructive sleep apnea (adult) (pediatric): Secondary | ICD-10-CM

## 2018-09-28 HISTORY — DX: Obstructive sleep apnea (adult) (pediatric): G47.33

## 2018-09-28 NOTE — Telephone Encounter (Signed)
HST 09/27/18 >> AHI 32.4, SpO2 low 76%.   Please let her know that the sleep study showed severe obstructive sleep apnea.  If she is agreeable, then please arrange for auto CPAP set up with pressure range 5 to 15 cm H2O with heated humidity and mask of choice.

## 2018-10-01 NOTE — Telephone Encounter (Signed)
Attempted to call patient today regarding results. I did not receive an answer at time of call. I have left a voicemail message for pt to return call. X1  

## 2018-10-02 ENCOUNTER — Telehealth: Payer: Self-pay | Admitting: Pulmonary Disease

## 2018-10-02 NOTE — Telephone Encounter (Signed)
HRCT chest 09/28/18 >> atherosclerosis, 6 mm nodule RML, 10 mm nodule LUL both stable since 2016; calcified granulomas, mild patchy air trapping, moderate varicoid BTX, mild subpleural reticulation   Please let her know her CT chest shows expected changes from history of sarcoidosis.  No change to current tx at this time.  Will discuss in more detail at her next visit.

## 2018-10-03 NOTE — Telephone Encounter (Signed)
Called and spoke with patient regarding results.  Informed the patient of results and recommendations today. Placed order for auto CPAP set up with pressure range 5 to 15 cm H2O with heated humidity and mask of choice Advised pt once set up on CPAP machine call the office back to schedule 51mo f/u Pt verbalized understanding and denied any questions or concerns at this time.  Nothing further needed.

## 2018-10-04 DIAGNOSIS — Z9181 History of falling: Secondary | ICD-10-CM | POA: Diagnosis not present

## 2018-10-04 DIAGNOSIS — Z0000 Encounter for general adult medical examination without abnormal findings: Secondary | ICD-10-CM | POA: Diagnosis not present

## 2018-10-04 DIAGNOSIS — Z1331 Encounter for screening for depression: Secondary | ICD-10-CM | POA: Diagnosis not present

## 2018-10-04 DIAGNOSIS — R197 Diarrhea, unspecified: Secondary | ICD-10-CM | POA: Diagnosis not present

## 2018-10-04 NOTE — Telephone Encounter (Signed)
Attempted to call patient today regarding results. I did not receive an answer at time of call. I have left a voicemail message for pt to return call. X1  

## 2018-10-05 DIAGNOSIS — R197 Diarrhea, unspecified: Secondary | ICD-10-CM | POA: Diagnosis not present

## 2018-10-08 NOTE — Telephone Encounter (Signed)
Called and spoke with patient regarding results.  Informed the patient of results and recommendations today. Pt verbalized understanding and denied any questions or concerns at this time.  Nothing further needed.  

## 2018-10-12 NOTE — Telephone Encounter (Signed)
Called and confirmed with April J that it was okay to schedule patient's Monovisc injection appointment on 10/16/18.

## 2018-10-15 ENCOUNTER — Ambulatory Visit (INDEPENDENT_AMBULATORY_CARE_PROVIDER_SITE_OTHER): Payer: Medicare Other | Admitting: Podiatry

## 2018-10-15 DIAGNOSIS — E1151 Type 2 diabetes mellitus with diabetic peripheral angiopathy without gangrene: Secondary | ICD-10-CM | POA: Diagnosis not present

## 2018-10-15 DIAGNOSIS — B351 Tinea unguium: Secondary | ICD-10-CM | POA: Diagnosis not present

## 2018-10-15 NOTE — Progress Notes (Addendum)
Subjective:  Patient ID: [name], female    DOB: 08-18-1946,  MRN: [id number]  Chief Complaint  Patient presents with  . debride    BL nail trimming -FBS: 126 A1C: 6 PCP: Scott x 3 wks    72 y.o. female presents  for diabetic foot care. Last AMBS was 126. Last A1c 6.0. Last saw PCP 3 weeks ago, Dr. Nicki Reaper Reports numbness and tingling in their feet. Reports cramping in legs and thighs.  Review of Systems: Negative except as noted in the HPI. Denies N/V/F/Ch.  Past Medical History:  Diagnosis Date  . Allergy   . Anxiety   . Arthritis   . Asthma   . Cataract    bilateral and removed  . Depression   . Diaphragmatic hernia without mention of obstruction or gangrene   . DM (diabetes mellitus) ([place])   . Dyspnea   . Esophageal reflux   . Esophagitis, unspecified   . H/O adenomatous polyp of colon 2011  . Heart murmur    no cardiologist, family Dr. Maryfrances Bunnell  . History of kidney stones   . Hyperlipidemia   . Incontinence of feces   . Irritable bowel syndrome   . Kidney stones   . OSA (obstructive sleep apnea) 09/28/2018  . Sarcoidosis   . Sleep apnea    no cpap now  . Status post dilation of esophageal narrowing   . Unspecified asthma(493.90)   . Unspecified essential hypertension   . Unspecified hearing loss     Current Outpatient Medications:  .  albuterol (PROVENTIL HFA;VENTOLIN HFA) 108 (90 Base) MCG/ACT inhaler, INHALE 2 PUFFS BY MOUTH 4 TIMES A DAY AS NEEDED, Disp: 18 Inhaler, Rfl: 1 .  albuterol (PROVENTIL) (2.5 MG/3ML) 0.083% nebulizer solution, Take 3 mLs (2.5 mg total) by nebulization every 4 (four) hours as needed for wheezing or shortness of breath., Disp: 50 vial, Rfl: 1 .  amLODipine (NORVASC) 5 MG tablet, Take 5 mg by mouth daily., Disp: , Rfl: 2 .  aspirin EC 81 MG tablet, Take 81 mg by mouth daily., Disp: , Rfl:  .  atorvastatin (LIPITOR) 20 MG tablet, Take 20 mg by mouth every evening. , Disp: , Rfl:  .  beclomethasone (QVAR REDIHALER) 80  MCG/ACT inhaler, Inhale two doses twice daily during flare-up.  Rinse, gargle, and spit after use., Disp: 1 Inhaler, Rfl: 0 .  cholecalciferol (VITAMIN D) 1000 units tablet, Take 1,000 Units by mouth daily., Disp: , Rfl:  .  citalopram (CELEXA) 20 MG tablet, Take 20 mg by mouth daily.  , Disp: , Rfl:  .  Cyanocobalamin (VITAMIN B-12) 5000 MCG LOZG, Take 5,000 mcg by mouth daily., Disp: , Rfl:  .  dicyclomine (BENTYL) 10 MG capsule, Take 10 mg by mouth 2 (two) times daily. , Disp: , Rfl:  .  EPINEPHrine 0.3 mg/0.3 mL IJ SOAJ injection, USE AS DIRECTED FOR LIFE THREATENING ALLERGIC REACTION, Disp: , Rfl:  .  fexofenadine (ALLEGRA) 180 MG tablet, Take 180 mg by mouth daily as needed for allergies or rhinitis., Disp: , Rfl:  .  fluticasone (FLONASE) 50 MCG/ACT nasal spray, Use one spray in each nostril once or twice daily (Patient taking differently: Place 1 spray into both nostrils daily. ), Disp: 16 g, Rfl: 5 .  ipratropium (ATROVENT) 0.06 % nasal spray, Can use one to two sprays in each nostril every six hours if needed to dry up runny nose., Disp: 15 mL, Rfl: 5 .  ipratropium-albuterol (DUONEB) 0.5-2.5 (3) MG/3ML SOLN,  Take 3 mLs by nebulization every 4 (four) hours as needed (shortness of breath). , Disp: , Rfl:  .  liraglutide (VICTOZA) 18 MG/3ML SOPN, Inject 0.6 mg into the skin daily. , Disp: , Rfl:  .  mometasone-formoterol (DULERA) 200-5 MCG/ACT AERO, Inhale 2 puffs into the lungs 2 (two) times daily., Disp: , Rfl:  .  Multiple Vitamins-Minerals (ICAPS AREDS 2 PO), Take 1 capsule by mouth 2 (two) times daily., Disp: , Rfl:  .  omalizumab (XOLAIR) 150 MG injection, Inject 150 mg into the skin every 28 (twenty-eight) days. , Disp: , Rfl:  .  omeprazole (PRILOSEC) 40 MG capsule, TAKE 1 CAPSULE BY MOUTH TWICE A DAY, Disp: 60 capsule, Rfl: 4 .  pioglitazone (ACTOS) 30 MG tablet, Take 30 mg by mouth daily., Disp: , Rfl:  .  Probiotic Product (PROBIOTIC DAILY PO), Take 1 capsule by mouth at bedtime. ,  Disp: , Rfl:  .  ranitidine (ZANTAC) 300 MG tablet, Take 1 tablet (300 mg total) by mouth at bedtime., Disp: 30 tablet, Rfl: 5 .  sucralfate (CARAFATE) 1 g tablet, TAKE 1 TABLET BY MOUTH 4 TIMES A DAY TAKE WITH WATER, Disp: , Rfl: 0 .  telmisartan (MICARDIS) 80 MG tablet, Take 80 mg by mouth daily., Disp: , Rfl: 9 .  Tetrahydrozoline HCl (VISINE OP), Place 1 drop into both eyes daily as needed (dry eyes)., Disp: , Rfl:   Current Facility-Administered Medications:  .  omalizumab Arvid Right) injection 150 mg, 150 mg, Subcutaneous, Q28 days, Kozlow, Donnamarie Poag, MD, 150 mg at 09/26/18 0908  Social History   Tobacco Use  Smoking Status Never Smoker  Smokeless Tobacco Never Used    Allergies  Allergen Reactions  . Codeine Nausea And Vomiting  . Sulfa Antibiotics Other (See Comments)    Allergic per pt's mother    Objective:   There were no vitals filed for this visit. There is no height or weight on file to calculate BMI. Constitutional Well developed. Well nourished.  Vascular Dorsalis pedis pulses present 1+ bilaterally  Posterior tibial pulses absent bilaterally  Pedal hair growth diminished. Capillary refill normal to all digits.  No cyanosis or clubbing noted.  Neurologic Normal speech. Oriented to person, place, and time. Epicritic sensation to light touch grossly present bilaterally. Protective sensation with 5.07 monofilament  present bilaterally. Vibratory sensation present bilaterally.  Dermatologic Nails elongated, thickened, dystrophic. No open wounds. No skin lesions.  Orthopedic: Normal joint ROM without pain or crepitus bilaterally. No visible deformities. No bony tenderness.   Assessment:   1. Diabetes mellitus type 2 with peripheral artery disease ([place])   2. Onychomycosis    Plan:  Patient was evaluated and treated and all questions answered.  Diabetes with PAD, Onychomycosis -Educated on diabetic footcare. Diabetic risk level 1 -Nails x10 debrided sharply  and manually with large nail nipper and rotary burr.   Procedure: Nail Debridement Rationale: Patient meets criteria for routine foot care due to PAD Type of Debridement: manual, sharp debridement. Instrumentation: Nail nipper, rotary burr. Number of Nails: 10  Return in about 2 months (around 12/16/2018) for Diabetic Foot Care.

## 2018-10-16 ENCOUNTER — Ambulatory Visit (INDEPENDENT_AMBULATORY_CARE_PROVIDER_SITE_OTHER): Payer: Medicare Other | Admitting: Orthopaedic Surgery

## 2018-10-16 ENCOUNTER — Encounter (INDEPENDENT_AMBULATORY_CARE_PROVIDER_SITE_OTHER): Payer: Self-pay | Admitting: Orthopaedic Surgery

## 2018-10-16 VITALS — BP 151/69 | HR 63 | Ht 62.0 in | Wt 165.0 lb

## 2018-10-16 DIAGNOSIS — M170 Bilateral primary osteoarthritis of knee: Secondary | ICD-10-CM

## 2018-10-16 DIAGNOSIS — M1711 Unilateral primary osteoarthritis, right knee: Secondary | ICD-10-CM | POA: Diagnosis not present

## 2018-10-16 HISTORY — DX: Bilateral primary osteoarthritis of knee: M17.0

## 2018-10-16 MED ORDER — HYALURONAN 88 MG/4ML IX SOSY
88.0000 mg | PREFILLED_SYRINGE | INTRA_ARTICULAR | Status: AC | PRN
  Administered 2018-10-16: 88 mg via INTRA_ARTICULAR

## 2018-10-16 NOTE — Progress Notes (Signed)
Office Visit Note   Patient: [name]           Date of Birth: [birth year]           MRN: [id number] Visit Date: 10/16/2018              Requested by: Myer Peer, MD [place], [place] [id number] PCP: Myer Peer, MD   Assessment & Plan: Visit Diagnoses:  1. Bilateral primary osteoarthritis of knee     Plan: Monovisc injection right knee.  Follow-up next week for Monovisc injection left knee  Follow-Up Instructions: Return in about 1 week (around 10/23/2018).   Orders:  No orders of the defined types were placed in this encounter.  No orders of the defined types were placed in this encounter.     Procedures: Large Joint Inj: R knee on 10/16/2018 11:48 AM Indications: pain and diagnostic evaluation Details: 25 G 1.5 in needle, anteromedial approach  Arthrogram: No  Medications: 88 mg Hyaluronan 88 MG/4ML Outcome: tolerated well, no immediate complications Procedure, treatment alternatives, risks and benefits explained, specific risks discussed. Consent was given by the patient. Immediately prior to procedure a time out was called to verify the correct patient, procedure, equipment, support staff and site/side marked as required. Patient was prepped and draped in the usual sterile fashion.       Clinical Data: No additional findings.   Subjective: No chief complaint on file. Has been approved for Monovisc injections to both knees.  HPI  Review of Systems   Objective: Vital Signs: BP (!) 151/69 (BP Location: Left Arm, Patient Position: Sitting, Cuff Size: Normal)   Pulse 63   Ht 5\' 2"  (1.575 m)   Wt 165 lb (74.8 kg)   BMI 30.18 kg/m   Physical Exam  Ortho Exam awake alert and oriented x3.  Comfortable sitting.  Diffuse cutaneous pain about the right knee.  No obvious effusion.  Large knees.  Full extension of flexion about 100 degrees without instability  Specialty Comments:  No specialty comments  available.  Imaging: No results found.   PMFS History: Patient Active Problem List   Diagnosis Date Noted  . Bilateral primary osteoarthritis of knee 10/16/2018  . OSA (obstructive sleep apnea) 09/28/2018  . Shoulder arthritis 02/22/2018  . Primary osteoarthritis, left shoulder 12/11/2017  . DDD (degenerative disc disease), lumbar 01/23/2017  . History of diabetes mellitus 01/23/2017  . History of depression 01/23/2017  . History of asthma 01/23/2017  . Dyslipidemia 01/23/2017  . Age-related osteoporosis without current pathological fracture 01/23/2017  . History of gastroesophageal reflux (GERD) 01/17/2017  . History of hypertension 01/17/2017  . Primary osteoarthritis of both knees 01/17/2017  . Primary osteoarthritis of both feet 01/17/2017  . Vitamin D deficiency 01/17/2017  . Rheumatoid factor positive 12/13/2016  . Primary osteoarthritis of both hands 12/13/2016  . Obstructive lung disease ([place]) 07/29/2015  . H/O allergic rhinitis 07/29/2015  . LPRD (laryngopharyngeal reflux disease) 07/29/2015  . Chest pain 11/01/2013  . Murmur 11/01/2013  . DM 03/03/2010  . ABDOMINAL PAIN RIGHT LOWER QUADRANT 03/03/2010  . FULL INCONTINENCE OF FECES 03/03/2010  . DECREASED HEARING 05/02/2008  . HYPERTENSION 05/02/2008  . ESOPHAGITIS 05/02/2008  . HIATAL HERNIA 05/02/2008  . RECTAL INCONTINENCE 05/02/2008  . SARCOIDOSIS 11/16/2007  . Severe persistent asthma 11/16/2007  . GERD 11/16/2007  . IRRITABLE BOWEL SYNDROME 11/16/2007   Past Medical History:  Diagnosis Date  . Allergy   . Anxiety   . Arthritis   .  Asthma   . Cataract    bilateral and removed  . Depression   . Diaphragmatic hernia without mention of obstruction or gangrene   . DM (diabetes mellitus) ([place])   . Dyspnea   . Esophageal reflux   . Esophagitis, unspecified   . H/O adenomatous polyp of colon 2011  . Heart murmur    no cardiologist, family Dr. Maryfrances Bunnell  . History of kidney stones   .  Hyperlipidemia   . Incontinence of feces   . Irritable bowel syndrome   . Kidney stones   . OSA (obstructive sleep apnea) 09/28/2018  . Sarcoidosis   . Sleep apnea    no cpap now  . Status post dilation of esophageal narrowing   . Unspecified asthma(493.90)   . Unspecified essential hypertension   . Unspecified hearing loss     Family History  Problem Relation Age of Onset  . Esophageal cancer Father   . Diabetes Mother   . Diabetes Maternal Grandmother   . Colon cancer Neg Hx   . Rectal cancer Neg Hx   . Stomach cancer Neg Hx     Past Surgical History:  Procedure Laterality Date  . BLADDER SURGERY    . CATARACT EXTRACTION Bilateral   . HARDWARE REMOVAL Left 01/12/2017   Procedure: HARDWARE REMOVAL left wrist;  Surgeon: Leanora Cover, MD;  Location: [facility];  Service: Orthopedics;  Laterality: Left;  . HEMORRHOID SURGERY    . NASAL SEPTUM SURGERY    . PARTIAL HYSTERECTOMY    . REVERSE SHOULDER ARTHROPLASTY Left 02/22/2018  . REVERSE SHOULDER ARTHROPLASTY Left 02/22/2018   Procedure: LEFT REVERSE SHOULDER ARTHROPLASTY;  Surgeon: Meredith Pel, MD;  Location: [facility];  Service: Orthopedics;  Laterality: Left;  . TONSILLECTOMY AND ADENOIDECTOMY    . TRIGGER FINGER RELEASE Left 05/12/2016   Procedure: RELEASE TRIGGER FINGER/A-1 PULLEY INFECTION LEFT RING TENDON SHEATH INJECT RIGHT INDEX FINGER;  Surgeon: Leanora Cover, MD;  Location: [facility];  Service: Orthopedics;  Laterality: Left;  . TUBAL LIGATION     Social History   Occupational History  . Occupation: retired  Tobacco Use  . Smoking status: Never Smoker  . Smokeless tobacco: Never Used  Substance and Sexual Activity  . Alcohol use: No  . Drug use: No  . Sexual activity: Not on file     Garald Balding, MD   Note - This record has been created using [employer].  Chart creation errors have been sought, but may not always  have been located. Such creation errors do not  reflect on  the standard of medical care.

## 2018-10-22 ENCOUNTER — Ambulatory Visit (INDEPENDENT_AMBULATORY_CARE_PROVIDER_SITE_OTHER): Payer: Medicare Other | Admitting: Orthopaedic Surgery

## 2018-10-22 ENCOUNTER — Encounter (INDEPENDENT_AMBULATORY_CARE_PROVIDER_SITE_OTHER): Payer: Self-pay | Admitting: Orthopaedic Surgery

## 2018-10-22 DIAGNOSIS — M1712 Unilateral primary osteoarthritis, left knee: Secondary | ICD-10-CM

## 2018-10-22 DIAGNOSIS — M170 Bilateral primary osteoarthritis of knee: Secondary | ICD-10-CM

## 2018-10-22 MED ORDER — HYALURONAN 88 MG/4ML IX SOSY
88.0000 mg | PREFILLED_SYRINGE | INTRA_ARTICULAR | Status: AC | PRN
  Administered 2018-10-22: 88 mg via INTRA_ARTICULAR

## 2018-10-22 NOTE — Progress Notes (Signed)
Office Visit Note   Patient: [name]           Date of Birth: Feb 21, 1946           MRN: [id number] Visit Date: 10/22/2018              Requested by: Myer Peer, MD [place], [place] [id number] PCP: Myer Peer, MD   Assessment & Plan: Visit Diagnoses:  1. Bilateral primary osteoarthritis of knee     Plan: Monovisc injection left knee.  Return as needed  Follow-Up Instructions: Return if symptoms worsen or fail to improve.   Orders:  No orders of the defined types were placed in this encounter.  No orders of the defined types were placed in this encounter.     Procedures: Large Joint Inj: L knee on 10/22/2018 1:08 PM Indications: pain and joint swelling Details: 25 G 1.5 in needle, anteromedial approach  Arthrogram: No  Medications: 88 mg Hyaluronan 88 MG/4ML Outcome: tolerated well, no immediate complications Procedure, treatment alternatives, risks and benefits explained, specific risks discussed. Consent was given by the patient. Immediately prior to procedure a time out was called to verify the correct patient, procedure, equipment, support staff and site/side marked as required. Patient was prepped and draped in the usual sterile fashion.       Clinical Data: No additional findings.   Subjective: No chief complaint on file. [name] has had the Monovisc injection in right knee with good result.  She is been precertified a Monovisc injection of the left knee  HPI  Review of Systems   Objective: Vital Signs: There were no vitals taken for this visit.  Physical Exam  Ortho Exam neither knee was hot red or swollen.  No effusion.  No instability.  Specialty Comments:  No specialty comments available.  Imaging: No results found.   PMFS History: Patient Active Problem List   Diagnosis Date Noted  . Bilateral primary osteoarthritis of knee 10/16/2018  . OSA (obstructive sleep apnea) 09/28/2018  . Shoulder  arthritis 02/22/2018  . Primary osteoarthritis, left shoulder 12/11/2017  . DDD (degenerative disc disease), lumbar 01/23/2017  . History of diabetes mellitus 01/23/2017  . History of depression 01/23/2017  . History of asthma 01/23/2017  . Dyslipidemia 01/23/2017  . Age-related osteoporosis without current pathological fracture 01/23/2017  . History of gastroesophageal reflux (GERD) 01/17/2017  . History of hypertension 01/17/2017  . Primary osteoarthritis of both knees 01/17/2017  . Primary osteoarthritis of both feet 01/17/2017  . Vitamin D deficiency 01/17/2017  . Rheumatoid factor positive 12/13/2016  . Primary osteoarthritis of both hands 12/13/2016  . Obstructive lung disease ([place]) 07/29/2015  . H/O allergic rhinitis 07/29/2015  . LPRD (laryngopharyngeal reflux disease) 07/29/2015  . Chest pain 11/01/2013  . Murmur 11/01/2013  . DM 03/03/2010  . ABDOMINAL PAIN RIGHT LOWER QUADRANT 03/03/2010  . FULL INCONTINENCE OF FECES 03/03/2010  . DECREASED HEARING 05/02/2008  . HYPERTENSION 05/02/2008  . ESOPHAGITIS 05/02/2008  . HIATAL HERNIA 05/02/2008  . RECTAL INCONTINENCE 05/02/2008  . SARCOIDOSIS 11/16/2007  . Severe persistent asthma 11/16/2007  . GERD 11/16/2007  . IRRITABLE BOWEL SYNDROME 11/16/2007   Past Medical History:  Diagnosis Date  . Allergy   . Anxiety   . Arthritis   . Asthma   . Cataract    bilateral and removed  . Depression   . Diaphragmatic hernia without mention of obstruction or gangrene   . DM (diabetes mellitus) ([place])   .  Dyspnea   . Esophageal reflux   . Esophagitis, unspecified   . H/O adenomatous polyp of colon 2011  . Heart murmur    no cardiologist, family Dr. Maryfrances Bunnell  . History of kidney stones   . Hyperlipidemia   . Incontinence of feces   . Irritable bowel syndrome   . Kidney stones   . OSA (obstructive sleep apnea) 09/28/2018  . Sarcoidosis   . Sleep apnea    no cpap now  . Status post dilation of esophageal narrowing   .  Unspecified asthma(493.90)   . Unspecified essential hypertension   . Unspecified hearing loss     Family History  Problem Relation Age of Onset  . Esophageal cancer Father   . Diabetes Mother   . Diabetes Maternal Grandmother   . Colon cancer Neg Hx   . Rectal cancer Neg Hx   . Stomach cancer Neg Hx     Past Surgical History:  Procedure Laterality Date  . BLADDER SURGERY    . CATARACT EXTRACTION Bilateral   . HARDWARE REMOVAL Left 01/12/2017   Procedure: HARDWARE REMOVAL left wrist;  Surgeon: Leanora Cover, MD;  Location: [facility];  Service: Orthopedics;  Laterality: Left;  . HEMORRHOID SURGERY    . NASAL SEPTUM SURGERY    . PARTIAL HYSTERECTOMY    . REVERSE SHOULDER ARTHROPLASTY Left 02/22/2018  . REVERSE SHOULDER ARTHROPLASTY Left 02/22/2018   Procedure: LEFT REVERSE SHOULDER ARTHROPLASTY;  Surgeon: Meredith Pel, MD;  Location: [facility];  Service: Orthopedics;  Laterality: Left;  . TONSILLECTOMY AND ADENOIDECTOMY    . TRIGGER FINGER RELEASE Left 05/12/2016   Procedure: RELEASE TRIGGER FINGER/A-1 PULLEY INFECTION LEFT RING TENDON SHEATH INJECT RIGHT INDEX FINGER;  Surgeon: Leanora Cover, MD;  Location: [facility];  Service: Orthopedics;  Laterality: Left;  . TUBAL LIGATION     Social History   Occupational History  . Occupation: retired  Tobacco Use  . Smoking status: Never Smoker  . Smokeless tobacco: Never Used  Substance and Sexual Activity  . Alcohol use: No  . Drug use: No  . Sexual activity: Not on file     [name] Balding, MD   Note - This record has been created using [employer].  Chart creation errors have been sought, but may not always  have been located. Such creation errors do not reflect on  the standard of medical care.

## 2018-10-23 DIAGNOSIS — J4540 Moderate persistent asthma, uncomplicated: Secondary | ICD-10-CM | POA: Diagnosis not present

## 2018-10-24 ENCOUNTER — Ambulatory Visit (INDEPENDENT_AMBULATORY_CARE_PROVIDER_SITE_OTHER): Payer: Medicare Other | Admitting: *Deleted

## 2018-10-24 ENCOUNTER — Other Ambulatory Visit: Payer: Self-pay | Admitting: Allergy and Immunology

## 2018-10-24 DIAGNOSIS — J4540 Moderate persistent asthma, uncomplicated: Secondary | ICD-10-CM

## 2018-10-29 ENCOUNTER — Ambulatory Visit (INDEPENDENT_AMBULATORY_CARE_PROVIDER_SITE_OTHER): Payer: Medicare Other | Admitting: Allergy and Immunology

## 2018-10-29 ENCOUNTER — Encounter: Payer: Self-pay | Admitting: Allergy and Immunology

## 2018-10-29 VITALS — BP 140/82 | HR 63 | Resp 16

## 2018-10-29 DIAGNOSIS — K219 Gastro-esophageal reflux disease without esophagitis: Secondary | ICD-10-CM

## 2018-10-29 DIAGNOSIS — Z862 Personal history of diseases of the blood and blood-forming organs and certain disorders involving the immune mechanism: Secondary | ICD-10-CM

## 2018-10-29 DIAGNOSIS — J3089 Other allergic rhinitis: Secondary | ICD-10-CM | POA: Diagnosis not present

## 2018-10-29 DIAGNOSIS — J449 Chronic obstructive pulmonary disease, unspecified: Secondary | ICD-10-CM | POA: Diagnosis not present

## 2018-10-29 NOTE — Patient Instructions (Signed)
  1. Continue Xolair and Epi-Pen  2. Continue Dulera 200 or Symbicort 160 -2 inhalations 2 times per day with spacer  3. Add SAMPLES of Qvar 80 REDIHALER two inhalations two times per day to [facility] during "flare up".  4. Continue Flonase - one spray each nostril 1-2 times per day  5. Continue omeprazole 40 twice a day + ranitidine 300 in evening  6. Continue nasal saline, ProAir HFA, Duoneb if needed.  7. Can add OTC Loratadine / Claritin 10 mg one tablet 1-2 times per day if needed  8. Can add OTC Mucinex DM 1-2 tablet 1-2 times per day if needed  9. Can add ipratropium 0.06% nasal spray - 1-2 sprays each nostril every 6 hours if needed  10. Return to clinic in 12 weeks or earlier if problem

## 2018-10-29 NOTE — Progress Notes (Signed)
Follow-up Note  Referring Provider: Myer Peer, MD Primary Provider: Myer Peer, MD Date of Office Visit: 10/29/2018  Subjective:   [name] (DOB: 22-Jul-1946) is a 72 y.o. female who returns to the [facility] on 10/29/2018 in re-evaluation of the following:  HPI: [name] returns to this clinic in evaluation of COPD with component of asthma, allergic rhinitis, and LPR and history of sarcoidosis.  Her last visit to this clinic was 06 August 2018 at which point in time we gave her a systemic steroid for an exacerbation of her airway issue.  Overall she has done very well and has not required a systemic steroid or antibiotic to treat any type of respiratory tract issue and rarely used a short acting bronchodilator although she could not really exert herself very much without being short of breath.  Unfortunately, she ran out of [facility] because of a financial issue and could not get this medication refilled.  She was out of her [facility] for a month at which point in time she developed issues with wheezing and coughing and had to use her bronchodilator 3 times per day.  She picked up a sample of Symbicort from our clinic last week and thus she has been using this medication for the past 7 days and she is doing much better at this point in time and has not had the need to use a short acting bronchodilator.  Her nose is doing quite well.  Her reflux is going pretty well at this point in time on her current therapy.  She did obtain a flu vaccine this year.  Allergies as of 10/29/2018      Reactions   Codeine Nausea And Vomiting   Sulfa Antibiotics Other (See Comments)   Allergic per pt's mother       Medication List      albuterol (2.5 MG/3ML) 0.083% nebulizer solution Commonly known as:  PROVENTIL Take 3 mLs (2.5 mg total) by nebulization every 4 (four) hours as needed for wheezing or shortness of breath.   albuterol 108 (90 Base) MCG/ACT  inhaler Commonly known as:  PROVENTIL HFA;VENTOLIN HFA INHALE 2 PUFFS BY MOUTH 4 TIMES A DAY AS NEEDED   albuterol 108 (90 Base) MCG/ACT inhaler Commonly known as:  PROVENTIL HFA;VENTOLIN HFA INHALE 2 PUFFS BY MOUTH 4 TIMES A DAY AS NEEDED   amLODipine 5 MG tablet Commonly known as:  NORVASC Take 5 mg by mouth daily.   aspirin EC 81 MG tablet Take 81 mg by mouth daily.   atorvastatin 20 MG tablet Commonly known as:  LIPITOR Take 20 mg by mouth every evening.   beclomethasone 80 MCG/ACT inhaler Commonly known as:  QVAR Inhale two doses twice daily during flare-up.  Rinse, gargle, and spit after use.   cholecalciferol 1000 units tablet Commonly known as:  VITAMIN D Take 1,000 Units by mouth daily.   citalopram 20 MG tablet Commonly known as:  CELEXA Take 20 mg by mouth daily.   dicyclomine 10 MG capsule Commonly known as:  BENTYL Take 10 mg by mouth 2 (two) times daily.   DULERA 200-5 MCG/ACT Aero Generic drug:  mometasone-formoterol Inhale 2 puffs into the lungs 2 (two) times daily.   EPINEPHrine 0.3 mg/0.3 mL Soaj injection Commonly known as:  EPI-PEN USE AS DIRECTED FOR LIFE THREATENING ALLERGIC REACTION   fexofenadine 180 MG tablet Commonly known as:  ALLEGRA Take 180 mg by mouth daily as needed for allergies or rhinitis.  fluticasone 50 MCG/ACT nasal spray Commonly known as:  FLONASE Use one spray in each nostril once or twice daily   ICAPS AREDS 2 PO Take 1 capsule by mouth 2 (two) times daily.   ipratropium 0.06 % nasal spray Commonly known as:  ATROVENT Can use one to two sprays in each nostril every six hours if needed to dry up runny nose.   ipratropium-albuterol 0.5-2.5 (3) MG/3ML Soln Commonly known as:  DUONEB Take 3 mLs by nebulization every 4 (four) hours as needed (shortness of breath).   omalizumab 150 MG injection Commonly known as:  XOLAIR Inject 150 mg into the skin every 28 (twenty-eight) days.   omeprazole 40 MG capsule Commonly  known as:  PRILOSEC TAKE 1 CAPSULE BY MOUTH TWICE A DAY   pioglitazone 30 MG tablet Commonly known as:  ACTOS Take 30 mg by mouth daily.   PROBIOTIC DAILY PO Take 1 capsule by mouth at bedtime.   ranitidine 300 MG tablet Commonly known as:  ZANTAC Take 1 tablet (300 mg total) by mouth at bedtime.   VICTOZA 18 MG/3ML Sopn Generic drug:  liraglutide Inject 0.6 mg into the skin daily.   VISINE OP Place 1 drop into both eyes daily as needed (dry eyes).   Vitamin B-12 5000 MCG Lozg Take 5,000 mcg by mouth daily.       Past Medical History:  Diagnosis Date  . Allergy   . Anxiety   . Arthritis   . Asthma   . Cataract    bilateral and removed  . Depression   . Diaphragmatic hernia without mention of obstruction or gangrene   . DM (diabetes mellitus) ([place])   . Dyspnea   . Esophageal reflux   . Esophagitis, unspecified   . H/O adenomatous polyp of colon 2011  . Heart murmur    no cardiologist, family Dr. Maryfrances Bunnell  . History of kidney stones   . Hyperlipidemia   . Incontinence of feces   . Irritable bowel syndrome   . Kidney stones   . OSA (obstructive sleep apnea) 09/28/2018  . Sarcoidosis   . Sleep apnea    no cpap now  . Status post dilation of esophageal narrowing   . Unspecified asthma(493.90)   . Unspecified essential hypertension   . Unspecified hearing loss     Past Surgical History:  Procedure Laterality Date  . BLADDER SURGERY    . CATARACT EXTRACTION Bilateral   . HARDWARE REMOVAL Left 01/12/2017   Procedure: HARDWARE REMOVAL left wrist;  Surgeon: Leanora Cover, MD;  Location: [facility];  Service: Orthopedics;  Laterality: Left;  . HEMORRHOID SURGERY    . NASAL SEPTUM SURGERY    . PARTIAL HYSTERECTOMY    . REVERSE SHOULDER ARTHROPLASTY Left 02/22/2018  . REVERSE SHOULDER ARTHROPLASTY Left 02/22/2018   Procedure: LEFT REVERSE SHOULDER ARTHROPLASTY;  Surgeon: Meredith Pel, MD;  Location: [facility];  Service: Orthopedics;  Laterality:  Left;  . TONSILLECTOMY AND ADENOIDECTOMY    . TRIGGER FINGER RELEASE Left 05/12/2016   Procedure: RELEASE TRIGGER FINGER/A-1 PULLEY INFECTION LEFT RING TENDON SHEATH INJECT RIGHT INDEX FINGER;  Surgeon: Leanora Cover, MD;  Location: [facility];  Service: Orthopedics;  Laterality: Left;  . TUBAL LIGATION      Review of systems negative except as noted in HPI / PMHx or noted below:  Review of Systems  Constitutional: Negative.   HENT: Negative.   Eyes: Negative.   Respiratory: Negative.   Cardiovascular: Negative.  Gastrointestinal: Negative.   Genitourinary: Negative.   Musculoskeletal: Negative.   Skin: Negative.   Neurological: Negative.   Endo/Heme/Allergies: Negative.   Psychiatric/Behavioral: Negative.      Objective:   Vitals:   10/29/18 1132  BP: 140/82  Pulse: 63  Resp: 16          Physical Exam  HENT:  Head: Normocephalic.  Right Ear: Tympanic membrane, external ear and ear canal normal.  Left Ear: Tympanic membrane, external ear and ear canal normal.  Nose: Nose normal. No mucosal edema or rhinorrhea.  Mouth/Throat: Uvula is midline, oropharynx is clear and moist and mucous membranes are normal. No oropharyngeal exudate.  Hearing aid  Eyes: Conjunctivae are normal.  Neck: Trachea normal. No tracheal tenderness present. No tracheal deviation present. No thyromegaly present.  Cardiovascular: Normal rate, regular rhythm, S1 normal and S2 normal.  Murmur (Systolic) heard. Pulmonary/Chest: Breath sounds normal. No stridor. No respiratory distress. She has no wheezes. She has no rales.  Musculoskeletal: She exhibits no edema.  Lymphadenopathy:       Head (right side): No tonsillar adenopathy present.       Head (left side): No tonsillar adenopathy present.    She has no cervical adenopathy.  Neurological: She is alert.  Skin: No rash noted. She is not diaphoretic. No erythema. Nails show no clubbing.    Diagnostics:    Spirometry was  performed and demonstrated an FEV1 of 2.03 at 91 % of predicted.  The patient had an Asthma Control Test with the following results: ACT Total Score: 13.    Assessment and Plan:   1. COPD with asthma ([place])   2. History of sarcoidosis   3. Other allergic rhinitis   4. LPRD (laryngopharyngeal reflux disease)     1. Continue Xolair and Epi-Pen  2. Continue Dulera 200 or Symbicort 160 -2 inhalations 2 times per day with spacer  3. Add SAMPLES of Qvar 80 REDIHALER two inhalations two times per day to [facility] during "flare up".  4. Continue Flonase - one spray each nostril 1-2 times per day  5. Continue omeprazole 40 twice a day + ranitidine 300 in evening  6. Continue nasal saline, ProAir HFA, Duoneb if needed.  7. Can add OTC Loratadine / Claritin 10 mg one tablet 1-2 times per day if needed  8. Can add OTC Mucinex DM 1-2 tablet 1-2 times per day if needed  9. Can add ipratropium 0.06% nasal spray - 1-2 sprays each nostril every 6 hours if needed  10. Return to clinic in 12 weeks or earlier if problem  [name] appears to be doing relatively well at this point in time on a large collection of medical therapy including the use of omalizumab designed to prevent significant inflammation of her airway.  As well, her reflux appears to be doing relatively well on her current treatment plan.  I will now see her back in this clinic in 12 weeks or earlier if there is a problem.  Allena Katz, MD Allergy / Immunology [place]

## 2018-10-30 ENCOUNTER — Encounter: Payer: Self-pay | Admitting: Allergy and Immunology

## 2018-11-01 ENCOUNTER — Other Ambulatory Visit: Payer: Self-pay | Admitting: Pulmonary Disease

## 2018-11-01 ENCOUNTER — Telehealth: Payer: Self-pay | Admitting: Pulmonary Disease

## 2018-11-01 DIAGNOSIS — J449 Chronic obstructive pulmonary disease, unspecified: Secondary | ICD-10-CM

## 2018-11-01 NOTE — Telephone Encounter (Signed)
Called and spoke to patient. She states she is returning a phone call from [place]. I explained to the patient that I did not see a phone however she does have a appointment tomorrow and if anything was needed we would address then. Voiced understanding. Nothing further is need at this time.

## 2018-11-02 ENCOUNTER — Encounter: Payer: Self-pay | Admitting: Pulmonary Disease

## 2018-11-02 ENCOUNTER — Ambulatory Visit (INDEPENDENT_AMBULATORY_CARE_PROVIDER_SITE_OTHER): Payer: Medicare Other | Admitting: Pulmonary Disease

## 2018-11-02 VITALS — BP 124/64 | HR 72 | Ht 60.0 in | Wt 169.0 lb

## 2018-11-02 DIAGNOSIS — J449 Chronic obstructive pulmonary disease, unspecified: Secondary | ICD-10-CM

## 2018-11-02 DIAGNOSIS — D869 Sarcoidosis, unspecified: Secondary | ICD-10-CM | POA: Diagnosis not present

## 2018-11-02 DIAGNOSIS — G4733 Obstructive sleep apnea (adult) (pediatric): Secondary | ICD-10-CM

## 2018-11-02 LAB — PULMONARY FUNCTION TEST
DL/VA % pred: 90 %
DL/VA: 3.91 ml/min/mmHg/L
DLCO unc % pred: 60 %
DLCO unc: 11.92 ml/min/mmHg
FEF 25-75 Post: 0.64 L/sec
FEF 25-75 Pre: 0.37 L/sec
FEF2575-%CHANGE-POST: 72 %
FEF2575-%Pred-Post: 38 %
FEF2575-%Pred-Pre: 22 %
FEV1-%CHANGE-POST: 17 %
FEV1-%Pred-Post: 53 %
FEV1-%Pred-Pre: 45 %
FEV1-POST: 1.03 L
FEV1-PRE: 0.88 L
FEV1FVC-%CHANGE-POST: 7 %
FEV1FVC-%Pred-Pre: 79 %
FEV6-%CHANGE-POST: 10 %
FEV6-%PRED-PRE: 59 %
FEV6-%Pred-Post: 65 %
FEV6-PRE: 1.45 L
FEV6-Post: 1.6 L
FEV6FVC-%Change-Post: 1 %
FEV6FVC-%PRED-PRE: 103 %
FEV6FVC-%Pred-Post: 105 %
FVC-%CHANGE-POST: 8 %
FVC-%PRED-POST: 62 %
FVC-%PRED-PRE: 57 %
FVC-POST: 1.6 L
FVC-PRE: 1.47 L
POST FEV6/FVC RATIO: 100 %
PRE FEV6/FVC RATIO: 99 %
Post FEV1/FVC ratio: 65 %
Pre FEV1/FVC ratio: 60 %
RV % PRED: 93 %
RV: 1.91 L
TLC % pred: 76 %
TLC: 3.45 L

## 2018-11-02 NOTE — Progress Notes (Signed)
PFT done today. 

## 2018-11-02 NOTE — Patient Instructions (Signed)
Follow up in 6 months 

## 2018-11-02 NOTE — Progress Notes (Signed)
[facility]  Chief Complaint  Patient presents with  . Follow-up    Pt had full PFT prior to ov. Pt has increase swelling in both legs in the last 7 days. Pt has SOB with exertion, some wheezing, chest tightness, productive cough-yellow and lots of headaches in last few. Yesterday was first day pt had produtive cough with blood, and blew her nose and had yellow with blood. The blood amount was size of a nickel.     Constitutional:  BP 124/64 (BP Location: Left Arm, Cuff Size: Normal)   Pulse 72   Ht 5' (1.524 m)   Wt 169 lb (76.7 kg)   SpO2 98%   BMI 33.01 kg/m   Past Medical History:  Allergies, Anxiety, Depression, Hiatal hernia, DM, GERD, Colon polyp, Nephrolithiasis, HLD, IBS, Sarcoidosis, HTN  Brief Summary:  [name] is a 72 y.o. female with obstructive sleep apnea, COPD with asthma, and sarcoidosis.  CT chest showed chronic changes from sarcoidosis w/o active inflammation (reviewed by me).  Home sleep study showed severe OSA.  Started on CPAP. Using nasal mask.  Sleeping better.  No issue with mask fit.  PFT today showed moderate obstruction, positive bronchodilator response, mild restriction and mild diffusion defect.  He has some sinus congestion with post nasal drip.  Had episode over last day of blood mixed with her nasal drainage.  Not having fever, headache, sore throat, chest discomfort.  She gets ankle swelling during the day.  This is better when she keeps her feet up at night. Gets winded walking up hills.    Maintained SpO2 > 88% on room with ambulation today.  Physical Exam:   Appearance - well kempt   ENMT - no sinus tenderness, no nasal discharge, no oral exudate, overbite, MP 4, triangular uvula, narrow nasal passages  Neck - no masses, trachea midline, no thyromegaly, no elevation in JVP  Respiratory - normal appearance of chest wall, normal respiratory effort w/o accessory muscle use, no dullness on  percussion, no tactile fremitus, no wheezing or rales  CV - s1s2 regular rate and rhythm, no murmurs, no peripheral edema, no varicosities, radial pulses symmetric  GI - soft, non tender, no masses, no hepatosplenomegaly  Lymph - no adenopathy noted in neck and axillary areas  MSK - normal gait  Ext - no cyanosis, clubbing, or joint inflammation noted  Skin - no rashes, lesions, or ulcers  Neuro - normal strength, oriented x 3  Psych - normal mood and affect   Assessment/Plan:   Obstructive sleep apnea. - she is compliant with CPAP and reports benefit from therapy - continue auto CPAP  COPD with asthma. - continue symbicort, prn albuterol - she is followed by Dr. Neldon Mc for xolair injections - she uses Qvar as needed when she has a flare up History of pulmonary sarcoidosis with bronchiectasis. - don't think this is an active issue at present - monitor clinically  Time spent 31 minutes  Patient Instructions  Follow up in 6 months    Chesley Mires, MD [place] Pager: [phone] 11/02/2018, 12:17 PM  Flow Sheet     Pulmonary tests:  Lymph node bx 04/27/01 >> non-caseating granuloma Spirometry 08/06/18 >> FEV1 0.79 (42%), FEV1% 65 HRCT chest 09/28/18 >> atherosclerosis, 6 mm nodule RML, 10 mm nodule LUL both stable since 2016; calcified granulomas, mild patchy air trapping, moderate varicoid BTX, mild subpleural reticulation PFT 11/02/18 >> FEV1 1.03 (53%), FEV1% 65, TLC 3.45 (76%),  DLCO 60%, + BD  Sleep tests:  HST 09/27/18 >> AHI 32.4, SpO2 low 76%. Auto CPAP 10/17/18 to 10/31/18 >> used on 15 of 15 nights with average 7 hrs 53 min.  Average AHI 5.6 with median CPAP 9 and 95 th percentile CPAP 12 cm H2O  Cardiac tests:  Echo 01/12/18 >> EF 55 to 60%, grade 1 DD, mild AS/AR, mild MR, PAS 49 mmHg   Review of Systems:  Constitutional: Negative for fever and unexpected weight change.  HENT: Negative for congestion, dental problem, ear  pain, nosebleeds, postnasal drip, rhinorrhea, sinus pressure, sneezing, sore throat and trouble swallowing.   Eyes: Negative for redness and itching.  Respiratory: Positive for cough, chest tightness, shortness of breath and wheezing.   Cardiovascular: Positive for leg swelling. Negative for palpitations.  Gastrointestinal: Negative for nausea and vomiting.  Genitourinary: Negative for dysuria.  Musculoskeletal: Positive for joint swelling.  Skin: Negative for rash.  Allergic/Immunologic: Positive for environmental allergies. Negative for food allergies and immunocompromised state.  Neurological: Negative for headaches.  Hematological: Does not bruise/bleed easily.  Psychiatric/Behavioral: Negative for dysphoric mood. The patient is nervous/anxious.    Medications:   Allergies as of 11/02/2018      Reactions   Codeine Nausea And Vomiting   Sulfa Antibiotics Other (See Comments)   Allergic per pt's mother       Medication List        Accurate as of 11/02/18 12:17 PM. Always use your most recent med list.          albuterol (2.5 MG/3ML) 0.083% nebulizer solution Commonly known as:  PROVENTIL Take 3 mLs (2.5 mg total) by nebulization every 4 (four) hours as needed for wheezing or shortness of breath.   albuterol 108 (90 Base) MCG/ACT inhaler Commonly known as:  PROVENTIL HFA;VENTOLIN HFA INHALE 2 PUFFS BY MOUTH 4 TIMES A DAY AS NEEDED   albuterol 108 (90 Base) MCG/ACT inhaler Commonly known as:  PROVENTIL HFA;VENTOLIN HFA INHALE 2 PUFFS BY MOUTH 4 TIMES A DAY AS NEEDED   amLODipine 5 MG tablet Commonly known as:  NORVASC Take 5 mg by mouth daily.   aspirin EC 81 MG tablet Take 81 mg by mouth daily.   atorvastatin 20 MG tablet Commonly known as:  LIPITOR Take 20 mg by mouth every evening.   beclomethasone 80 MCG/ACT inhaler Commonly known as:  QVAR Inhale two doses twice daily during flare-up.  Rinse, gargle, and spit after use.   cholecalciferol 1000 units  tablet Commonly known as:  VITAMIN D Take 1,000 Units by mouth daily.   citalopram 20 MG tablet Commonly known as:  CELEXA Take 20 mg by mouth daily.   dicyclomine 10 MG capsule Commonly known as:  BENTYL Take 10 mg by mouth 2 (two) times daily.   DULERA 200-5 MCG/ACT Aero Generic drug:  mometasone-formoterol Inhale 2 puffs into the lungs 2 (two) times daily.   EPINEPHrine 0.3 mg/0.3 mL Soaj injection Commonly known as:  EPI-PEN USE AS DIRECTED FOR LIFE THREATENING ALLERGIC REACTION   fexofenadine 180 MG tablet Commonly known as:  ALLEGRA Take 180 mg by mouth daily as needed for allergies or rhinitis.   fluticasone 50 MCG/ACT nasal spray Commonly known as:  FLONASE Use one spray in each nostril once or twice daily   ICAPS AREDS 2 PO Take 1 capsule by mouth 2 (two) times daily.   ipratropium 0.06 % nasal spray Commonly known as:  ATROVENT Can use one to two sprays in each nostril  every six hours if needed to dry up runny nose.   ipratropium-albuterol 0.5-2.5 (3) MG/3ML Soln Commonly known as:  DUONEB Take 3 mLs by nebulization every 4 (four) hours as needed (shortness of breath).   omalizumab 150 MG injection Commonly known as:  XOLAIR Inject 150 mg into the skin every 28 (twenty-eight) days.   omeprazole 40 MG capsule Commonly known as:  PRILOSEC TAKE 1 CAPSULE BY MOUTH TWICE A DAY   pioglitazone 30 MG tablet Commonly known as:  ACTOS Take 30 mg by mouth daily.   PROBIOTIC DAILY PO Take 1 capsule by mouth at bedtime.   ranitidine 300 MG tablet Commonly known as:  ZANTAC Take 1 tablet (300 mg total) by mouth at bedtime.   VICTOZA 18 MG/3ML Sopn Generic drug:  liraglutide Inject 0.6 mg into the skin daily.   VISINE OP Place 1 drop into both eyes daily as needed (dry eyes).   Vitamin B-12 5000 MCG Lozg Take 5,000 mcg by mouth daily.       Past Surgical History:  She  has a past surgical history that includes Partial hysterectomy; Tonsillectomy and  adenoidectomy; Nasal septum surgery; Tubal ligation; Hemorrhoid surgery; Bladder surgery; Cataract extraction (Bilateral); Trigger finger release (Left, 05/12/2016); Hardware Removal (Left, 01/12/2017); Reverse shoulder arthroplasty (Left, 02/22/2018); and Reverse shoulder arthroplasty (Left, 02/22/2018).  Family History:  Her family history includes Diabetes in her maternal grandmother and mother; Esophageal cancer in her father.  Social History:  She  reports that she has never smoked. She has never used smokeless tobacco. She reports that she does not drink alcohol or use drugs.

## 2018-11-05 DIAGNOSIS — J4541 Moderate persistent asthma with (acute) exacerbation: Secondary | ICD-10-CM | POA: Diagnosis not present

## 2018-11-05 DIAGNOSIS — J40 Bronchitis, not specified as acute or chronic: Secondary | ICD-10-CM | POA: Diagnosis not present

## 2018-11-05 DIAGNOSIS — J329 Chronic sinusitis, unspecified: Secondary | ICD-10-CM | POA: Diagnosis not present

## 2018-11-05 DIAGNOSIS — Z6831 Body mass index (BMI) 31.0-31.9, adult: Secondary | ICD-10-CM | POA: Diagnosis not present

## 2018-11-09 DIAGNOSIS — R351 Nocturia: Secondary | ICD-10-CM | POA: Diagnosis not present

## 2018-11-09 DIAGNOSIS — N3946 Mixed incontinence: Secondary | ICD-10-CM | POA: Diagnosis not present

## 2018-11-19 DIAGNOSIS — J189 Pneumonia, unspecified organism: Secondary | ICD-10-CM | POA: Diagnosis not present

## 2018-11-19 DIAGNOSIS — J4550 Severe persistent asthma, uncomplicated: Secondary | ICD-10-CM | POA: Diagnosis not present

## 2018-11-20 ENCOUNTER — Ambulatory Visit (INDEPENDENT_AMBULATORY_CARE_PROVIDER_SITE_OTHER): Payer: Medicare Other | Admitting: *Deleted

## 2018-11-20 DIAGNOSIS — J4550 Severe persistent asthma, uncomplicated: Secondary | ICD-10-CM

## 2018-11-20 DIAGNOSIS — J189 Pneumonia, unspecified organism: Secondary | ICD-10-CM | POA: Diagnosis not present

## 2018-11-26 DIAGNOSIS — Z961 Presence of intraocular lens: Secondary | ICD-10-CM | POA: Diagnosis not present

## 2018-11-26 DIAGNOSIS — E119 Type 2 diabetes mellitus without complications: Secondary | ICD-10-CM | POA: Diagnosis not present

## 2018-11-26 DIAGNOSIS — H353132 Nonexudative age-related macular degeneration, bilateral, intermediate dry stage: Secondary | ICD-10-CM | POA: Diagnosis not present

## 2018-11-26 DIAGNOSIS — H52203 Unspecified astigmatism, bilateral: Secondary | ICD-10-CM | POA: Diagnosis not present

## 2018-11-27 DIAGNOSIS — Z6830 Body mass index (BMI) 30.0-30.9, adult: Secondary | ICD-10-CM | POA: Diagnosis not present

## 2018-11-27 DIAGNOSIS — J4541 Moderate persistent asthma with (acute) exacerbation: Secondary | ICD-10-CM | POA: Diagnosis not present

## 2018-12-07 ENCOUNTER — Ambulatory Visit (INDEPENDENT_AMBULATORY_CARE_PROVIDER_SITE_OTHER): Payer: Medicare Other | Admitting: Pulmonary Disease

## 2018-12-07 ENCOUNTER — Encounter: Payer: Self-pay | Admitting: Pulmonary Disease

## 2018-12-07 VITALS — BP 120/60 | HR 69 | Ht 61.0 in | Wt 165.0 lb

## 2018-12-07 DIAGNOSIS — G4733 Obstructive sleep apnea (adult) (pediatric): Secondary | ICD-10-CM

## 2018-12-07 DIAGNOSIS — D869 Sarcoidosis, unspecified: Secondary | ICD-10-CM | POA: Diagnosis not present

## 2018-12-07 DIAGNOSIS — Z8701 Personal history of pneumonia (recurrent): Secondary | ICD-10-CM | POA: Diagnosis not present

## 2018-12-07 DIAGNOSIS — J449 Chronic obstructive pulmonary disease, unspecified: Secondary | ICD-10-CM

## 2018-12-07 NOTE — Patient Instructions (Signed)
Will schedule Chest xray  Will arrange for chin strap to use for your CPAP mask  Follow up in 6 months

## 2018-12-07 NOTE — Progress Notes (Signed)
[facility]  Chief Complaint  Patient presents with  . Follow-up    Pt has increase SOB with exertion, dry cough, wheezing and some chest tightness. Pt is new start on cpap machine, having some issues with mask.    Constitutional:  BP 120/60 (BP Location: Left Arm, Cuff Size: Normal)   Pulse 69   Ht 5\' 1"  (1.549 m)   Wt 165 lb (74.8 kg)   SpO2 93%   BMI 31.18 kg/m   Past Medical History:  Allergies, Anxiety, Depression, Hiatal hernia, DM, GERD, Colon polyp, Nephrolithiasis, HLD, IBS, Sarcoidosis, HTN  Brief Summary:  [name] is a 73 y.o. female with obstructive sleep apnea, COPD with asthma, and sarcoidosis.  She has some cough and chest congestion.  Will bring up clear sputum.  Occasionally has some wheezing.  Not having fever, hemoptysis, chest pain, skin rash or swelling.  Uses CPAP.  Has some trouble with mouth leak and dryness.   Physical Exam:   Appearance - well kempt   ENMT - no sinus tenderness, no nasal discharge, no oral exudate, triangular uvula, narrow nasal passages  Neck - no masses, trachea midline, no thyromegaly, no elevation in JVP  Respiratory - normal appearance of chest wall, normal respiratory effort w/o accessory muscle use, no dullness on percussion, no wheezing or rales  CV - s1s2 regular rate and rhythm, no murmurs, no peripheral edema, radial pulses symmetric  GI - soft, non tender  Lymph - no adenopathy noted in neck and axillary areas  MSK - normal gait  Ext - no cyanosis, clubbing, or joint inflammation noted  Skin - no rashes, lesions, or ulcers  Neuro - normal strength, oriented x 3  Psych - normal mood and affect   Assessment/Plan:   Obstructive sleep apnea. - she reports compliance with CPAP - will arrange for chin strap - continue auto CPAP  COPD with asthma. - continue dulera, and prn albuterol - followed by Dr. Neldon Mc for xolair - uses Qvar prn when she has a  flare  History of pulmonary sarcoidosis with bronchiectasis. - has some increase in symptoms - will repeat CXR and then determine if additional assessment is needed, or if her symptoms are more likely related to allergies/asthma   Patient Instructions  Will schedule Chest xray  Will arrange for chin strap to use for your CPAP mask  Follow up in 6 months  Time spent 27 minutes with > 50% face time   Chesley Mires, MD [place] Pager: [phone] 12/07/2018, 3:20 PM  Flow Sheet     Pulmonary tests:  Lymph node bx 04/27/01 >> non-caseating granuloma Spirometry 08/06/18 >> FEV1 0.79 (42%), FEV1% 65 HRCT chest 09/28/18 >> atherosclerosis, 6 mm nodule RML, 10 mm nodule LUL both stable since 2016; calcified granulomas, mild patchy air trapping, moderate varicoid BTX, mild subpleural reticulation PFT 11/02/18 >> FEV1 1.03 (53%), FEV1% 65, TLC 3.45 (76%), DLCO 60%, + BD  Sleep tests:  HST 09/27/18 >> AHI 32.4, SpO2 low 76%. Auto CPAP 10/17/18 to 10/31/18 >> used on 15 of 15 nights with average 7 hrs 53 min.  Average AHI 5.6 with median CPAP 9 and 95 th percentile CPAP 12 cm H2O  Cardiac tests:  Echo 01/12/18 >> EF 55 to 60%, grade 1 DD, mild AS/AR, mild MR, PAS 49 mmHg  Medications:   Allergies as of 12/07/2018      Reactions   Codeine Nausea And Vomiting   Sulfa Antibiotics  Other (See Comments)   Allergic per pt's mother       Medication List       Accurate as of December 07, 2018  3:20 PM. Always use your most recent med list.        albuterol (2.5 MG/3ML) 0.083% nebulizer solution Commonly known as:  PROVENTIL Take 3 mLs (2.5 mg total) by nebulization every 4 (four) hours as needed for wheezing or shortness of breath.   albuterol 108 (90 Base) MCG/ACT inhaler Commonly known as:  PROVENTIL HFA;VENTOLIN HFA INHALE 2 PUFFS BY MOUTH 4 TIMES A DAY AS NEEDED   albuterol 108 (90 Base) MCG/ACT inhaler Commonly known as:  PROVENTIL HFA;VENTOLIN  HFA INHALE 2 PUFFS BY MOUTH 4 TIMES A DAY AS NEEDED   amLODipine 5 MG tablet Commonly known as:  NORVASC Take 5 mg by mouth daily.   aspirin EC 81 MG tablet Take 81 mg by mouth daily.   atorvastatin 20 MG tablet Commonly known as:  LIPITOR Take 20 mg by mouth every evening.   beclomethasone 80 MCG/ACT inhaler Commonly known as:  QVAR REDIHALER Inhale two doses twice daily during flare-up.  Rinse, gargle, and spit after use.   cholecalciferol 1000 units tablet Commonly known as:  VITAMIN D Take 1,000 Units by mouth daily.   citalopram 20 MG tablet Commonly known as:  CELEXA Take 20 mg by mouth daily.   dicyclomine 10 MG capsule Commonly known as:  BENTYL Take 10 mg by mouth 2 (two) times daily.   DULERA 200-5 MCG/ACT Aero Generic drug:  mometasone-formoterol Inhale 2 puffs into the lungs 2 (two) times daily.   EPINEPHrine 0.3 mg/0.3 mL Soaj injection Commonly known as:  EPI-PEN USE AS DIRECTED FOR LIFE THREATENING ALLERGIC REACTION   fexofenadine 180 MG tablet Commonly known as:  ALLEGRA Take 180 mg by mouth daily as needed for allergies or rhinitis.   fluticasone 50 MCG/ACT nasal spray Commonly known as:  FLONASE Use one spray in each nostril once or twice daily   ICAPS AREDS 2 PO Take 1 capsule by mouth 2 (two) times daily.   ipratropium 0.06 % nasal spray Commonly known as:  ATROVENT Can use one to two sprays in each nostril every six hours if needed to dry up runny nose.   ipratropium-albuterol 0.5-2.5 (3) MG/3ML Soln Commonly known as:  DUONEB Take 3 mLs by nebulization every 4 (four) hours as needed (shortness of breath).   omalizumab 150 MG injection Commonly known as:  XOLAIR Inject 150 mg into the skin every 28 (twenty-eight) days.   omeprazole 40 MG capsule Commonly known as:  PRILOSEC TAKE 1 CAPSULE BY MOUTH TWICE A DAY   pioglitazone 30 MG tablet Commonly known as:  ACTOS Take 30 mg by mouth daily.   PROBIOTIC DAILY PO Take 1 capsule by  mouth at bedtime.   ranitidine 300 MG tablet Commonly known as:  ZANTAC Take 1 tablet (300 mg total) by mouth at bedtime.   VICTOZA 18 MG/3ML Sopn Generic drug:  liraglutide Inject 0.6 mg into the skin daily.   VISINE OP Place 1 drop into both eyes daily as needed (dry eyes).   Vitamin B-12 5000 MCG Lozg Take 5,000 mcg by mouth daily.       Past Surgical History:  She  has a past surgical history that includes Partial hysterectomy; Tonsillectomy and adenoidectomy; Nasal septum surgery; Tubal ligation; Hemorrhoid surgery; Bladder surgery; Cataract extraction (Bilateral); Trigger finger release (Left, 05/12/2016); Hardware Removal (Left, 01/12/2017); Reverse shoulder  arthroplasty (Left, 02/22/2018); and Reverse shoulder arthroplasty (Left, 02/22/2018).  Family History:  Her family history includes Diabetes in her maternal grandmother and mother; Esophageal cancer in her father.  Social History:  She  reports that she has never smoked. She has never used smokeless tobacco. She reports that she does not drink alcohol or use drugs.

## 2018-12-12 ENCOUNTER — Ambulatory Visit (INDEPENDENT_AMBULATORY_CARE_PROVIDER_SITE_OTHER)
Admission: RE | Admit: 2018-12-12 | Discharge: 2018-12-12 | Disposition: A | Discharge status: Home / Self Care | Payer: Medicare Other | Source: Ambulatory Visit | Attending: Pulmonary Disease | Admitting: Pulmonary Disease

## 2018-12-12 DIAGNOSIS — Z8701 Personal history of pneumonia (recurrent): Secondary | ICD-10-CM | POA: Diagnosis not present

## 2018-12-12 DIAGNOSIS — R05 Cough: Secondary | ICD-10-CM | POA: Diagnosis not present

## 2018-12-17 ENCOUNTER — Other Ambulatory Visit: Payer: Self-pay

## 2018-12-17 ENCOUNTER — Ambulatory Visit (INDEPENDENT_AMBULATORY_CARE_PROVIDER_SITE_OTHER): Payer: Medicare Other | Admitting: Podiatry

## 2018-12-17 ENCOUNTER — Encounter: Payer: Self-pay | Admitting: Podiatry

## 2018-12-17 VITALS — BP 144/58 | HR 78 | Resp 16

## 2018-12-17 DIAGNOSIS — B351 Tinea unguium: Secondary | ICD-10-CM | POA: Diagnosis not present

## 2018-12-17 DIAGNOSIS — E1151 Type 2 diabetes mellitus with diabetic peripheral angiopathy without gangrene: Secondary | ICD-10-CM

## 2018-12-17 DIAGNOSIS — H906 Mixed conductive and sensorineural hearing loss, bilateral: Secondary | ICD-10-CM

## 2018-12-17 HISTORY — DX: Mixed conductive and sensorineural hearing loss, bilateral: H90.6

## 2018-12-17 NOTE — Progress Notes (Signed)
Subjective:  Patient ID: [name], female    DOB: 12/24/1945,  MRN: [id number]  Chief Complaint  Patient presents with  . debride    Diabetic nail trimming  . Diabetes    FBS: 123 A1C: 6 PCP: [place]    73 y.o. female presents  for diabetic foot care. Last AMBS was 123. Last A1c 6.0. PCP Dr. Cathi Roan Reports numbness and tingling in their feet. Reports cramping in legs and thighs.  Review of Systems: Negative except as noted in the HPI. Denies N/V/F/Ch.  Past Medical History:  Diagnosis Date  . Allergy   . Anxiety   . Arthritis   . Asthma   . Cataract    bilateral and removed  . Depression   . Diaphragmatic hernia without mention of obstruction or gangrene   . DM (diabetes mellitus) ([place])   . Dyspnea   . Esophageal reflux   . Esophagitis, unspecified   . H/O adenomatous polyp of colon 2011  . Heart murmur    no cardiologist, family Dr. Maryfrances Bunnell  . History of kidney stones   . Hyperlipidemia   . Incontinence of feces   . Irritable bowel syndrome   . Kidney stones   . OSA (obstructive sleep apnea) 09/28/2018  . Sarcoidosis   . Sleep apnea    no cpap now  . Status post dilation of esophageal narrowing   . Unspecified asthma(493.90)   . Unspecified essential hypertension   . Unspecified hearing loss     Current Outpatient Medications:  .  albuterol (PROVENTIL HFA;VENTOLIN HFA) 108 (90 Base) MCG/ACT inhaler, INHALE 2 PUFFS BY MOUTH 4 TIMES A DAY AS NEEDED, Disp: 18 Inhaler, Rfl: 1 .  albuterol (PROVENTIL HFA;VENTOLIN HFA) 108 (90 Base) MCG/ACT inhaler, INHALE 2 PUFFS BY MOUTH 4 TIMES A DAY AS NEEDED, Disp: 18 Inhaler, Rfl: 1 .  albuterol (PROVENTIL) (2.5 MG/3ML) 0.083% nebulizer solution, Take 3 mLs (2.5 mg total) by nebulization every 4 (four) hours as needed for wheezing or shortness of breath., Disp: 50 vial, Rfl: 1 .  amLODipine (NORVASC) 5 MG tablet, Take 5 mg by mouth daily., Disp: , Rfl: 2 .  aspirin EC 81 MG tablet, Take 81 mg by mouth daily.,  Disp: , Rfl:  .  atorvastatin (LIPITOR) 20 MG tablet, Take 20 mg by mouth every evening. , Disp: , Rfl:  .  azithromycin (ZITHROMAX) 250 MG tablet, TAKE AS DIRECTED ON THE PACK, Disp: , Rfl:  .  beclomethasone (QVAR REDIHALER) 80 MCG/ACT inhaler, Inhale two doses twice daily during flare-up.  Rinse, gargle, and spit after use., Disp: 1 Inhaler, Rfl: 0 .  cefdinir (OMNICEF) 300 MG capsule, Take 300 mg by mouth 2 (two) times daily., Disp: , Rfl:  .  cholecalciferol (VITAMIN D) 1000 units tablet, Take 1,000 Units by mouth daily., Disp: , Rfl:  .  citalopram (CELEXA) 20 MG tablet, Take 20 mg by mouth daily.  , Disp: , Rfl:  .  Cyanocobalamin (VITAMIN B-12) 5000 MCG LOZG, Take 5,000 mcg by mouth daily., Disp: , Rfl:  .  dicyclomine (BENTYL) 10 MG capsule, Take 10 mg by mouth 2 (two) times daily. , Disp: , Rfl:  .  EPINEPHrine 0.3 mg/0.3 mL IJ SOAJ injection, USE AS DIRECTED FOR LIFE THREATENING ALLERGIC REACTION, Disp: , Rfl:  .  fexofenadine (ALLEGRA) 180 MG tablet, Take 180 mg by mouth daily as needed for allergies or rhinitis., Disp: , Rfl:  .  fluticasone (FLONASE) 50 MCG/ACT nasal spray, Use one spray in  each nostril once or twice daily (Patient taking differently: Place 1 spray into both nostrils daily. ), Disp: 16 g, Rfl: 5 .  ipratropium (ATROVENT) 0.06 % nasal spray, Can use one to two sprays in each nostril every six hours if needed to dry up runny nose., Disp: 15 mL, Rfl: 5 .  ipratropium-albuterol (DUONEB) 0.5-2.5 (3) MG/3ML SOLN, Take 3 mLs by nebulization every 4 (four) hours as needed (shortness of breath). , Disp: , Rfl:  .  liraglutide (VICTOZA) 18 MG/3ML SOPN, Inject 0.6 mg into the skin daily. , Disp: , Rfl:  .  mometasone-formoterol (DULERA) 200-5 MCG/ACT AERO, Inhale 2 puffs into the lungs 2 (two) times daily., Disp: , Rfl:  .  Multiple Vitamins-Minerals (ICAPS AREDS 2 PO), Take 1 capsule by mouth 2 (two) times daily., Disp: , Rfl:  .  omalizumab (XOLAIR) 150 MG injection, Inject 150  mg into the skin every 28 (twenty-eight) days. , Disp: , Rfl:  .  omeprazole (PRILOSEC) 40 MG capsule, TAKE 1 CAPSULE BY MOUTH TWICE A DAY, Disp: 60 capsule, Rfl: 4 .  oxybutynin (DITROPAN-XL) 10 MG 24 hr tablet, Take 10 mg by mouth daily., Disp: , Rfl:  .  pioglitazone (ACTOS) 30 MG tablet, Take 30 mg by mouth daily., Disp: , Rfl:  .  Probiotic Product (PROBIOTIC DAILY PO), Take 1 capsule by mouth at bedtime. , Disp: , Rfl:  .  ranitidine (ZANTAC) 300 MG tablet, Take 1 tablet (300 mg total) by mouth at bedtime., Disp: 30 tablet, Rfl: 5 .  telmisartan (MICARDIS) 80 MG tablet, Take 80 mg by mouth daily., Disp: , Rfl:  .  Tetrahydrozoline HCl (VISINE OP), Place 1 drop into both eyes daily as needed (dry eyes)., Disp: , Rfl:   Current Facility-Administered Medications:  .  omalizumab Arvid Right) injection 150 mg, 150 mg, Subcutaneous, Q28 days, Kozlow, Donnamarie Poag, MD, 150 mg at 11/20/18 9485  Social History   Tobacco Use  Smoking Status Never Smoker  Smokeless Tobacco Never Used    Allergies  Allergen Reactions  . Codeine Nausea And Vomiting  . Sulfa Antibiotics Other (See Comments)    Allergic per pt's mother    Objective:   Vitals:   12/17/18 1106  BP: (!) 144/58  Pulse: 78  Resp: 16   There is no height or weight on file to calculate BMI. Constitutional Well developed. Well nourished.  Vascular Dorsalis pedis pulses present 1+ bilaterally  Posterior tibial pulses absent bilaterally  Pedal hair growth diminished. Capillary refill normal to all digits.  No cyanosis or clubbing noted.  Neurologic Normal speech. Oriented to person, place, and time. Epicritic sensation to light touch grossly present bilaterally. Protective sensation with 5.07 monofilament  present bilaterally. Vibratory sensation present bilaterally.  Dermatologic Nails elongated, thickened, dystrophic. No open wounds. No skin lesions.  Orthopedic: Normal joint ROM without pain or crepitus bilaterally. No  visible deformities. No bony tenderness.   Assessment:   1. Onychomycosis   2. Diabetes mellitus type 2 with peripheral artery disease ([place])    Plan:  Patient was evaluated and treated and all questions answered.  Diabetes with PAD, Onychomycosis -Educated on diabetic footcare. Diabetic risk level 1 -Nails x10 debrided sharply and manually with large nail nipper and rotary burr.   Procedure: Nail Debridement Rationale: Patient meets criteria for routine foot care due to PAD Type of Debridement: manual, sharp debridement. Instrumentation: Nail nipper, rotary burr. Number of Nails: 10    Return in about 3 months (around 03/18/2019) for  Diabetic Foot Care.

## 2018-12-18 DIAGNOSIS — J4550 Severe persistent asthma, uncomplicated: Secondary | ICD-10-CM | POA: Diagnosis not present

## 2018-12-19 ENCOUNTER — Ambulatory Visit (INDEPENDENT_AMBULATORY_CARE_PROVIDER_SITE_OTHER): Payer: Medicare Other | Admitting: *Deleted

## 2018-12-19 DIAGNOSIS — J4550 Severe persistent asthma, uncomplicated: Secondary | ICD-10-CM

## 2018-12-20 DIAGNOSIS — H26492 Other secondary cataract, left eye: Secondary | ICD-10-CM | POA: Diagnosis not present

## 2018-12-27 ENCOUNTER — Telehealth: Payer: Self-pay | Admitting: Pulmonary Disease

## 2018-12-27 NOTE — Telephone Encounter (Signed)
EXAM: CHEST - 2 VIEW  COMPARISON:  01/29/2018  FINDINGS: Cardiac silhouette top-normal in size. No mediastinal or hilar masses. No convincing adenopathy.  There are prominent bronchovascular markings. Linear opacities are noted in the lung bases consistent with scarring, atelectasis or a combination. Lungs otherwise clear with no evidence of pneumonia or pulmonary edema.  No pleural effusion or pneumothorax.  Skeletal structures are intact. Left shoulder prosthesis is well aligned.  IMPRESSION: No acute cardiopulmonary disease.   Electronically Signed   By: Lajean Manes M.D.   On: 12/12/2018 12:58   Please let her know that her chest xray shows chronic changes of sarcoidosis.  No acute findings. No change to current treatment plan.

## 2018-12-28 NOTE — Telephone Encounter (Signed)
Called and spoke with pt letting her know the results of cxr. Pt expressed understanding. Nothing further needed.

## 2019-01-07 ENCOUNTER — Other Ambulatory Visit: Payer: Self-pay | Admitting: *Deleted

## 2019-01-07 MED ORDER — OMEPRAZOLE 40 MG PO CPDR: 40.0000 mg | DELAYED_RELEASE_CAPSULE | Freq: Two times a day (BID) | ORAL | 5 refills | Status: DC

## 2019-01-15 DIAGNOSIS — J4550 Severe persistent asthma, uncomplicated: Secondary | ICD-10-CM

## 2019-01-15 DIAGNOSIS — J441 Chronic obstructive pulmonary disease with (acute) exacerbation: Secondary | ICD-10-CM | POA: Diagnosis not present

## 2019-01-15 DIAGNOSIS — Z862 Personal history of diseases of the blood and blood-forming organs and certain disorders involving the immune mechanism: Secondary | ICD-10-CM | POA: Diagnosis not present

## 2019-01-15 DIAGNOSIS — K219 Gastro-esophageal reflux disease without esophagitis: Secondary | ICD-10-CM | POA: Diagnosis not present

## 2019-01-15 DIAGNOSIS — J3089 Other allergic rhinitis: Secondary | ICD-10-CM | POA: Diagnosis not present

## 2019-01-15 DIAGNOSIS — J45901 Unspecified asthma with (acute) exacerbation: Secondary | ICD-10-CM | POA: Diagnosis not present

## 2019-01-16 ENCOUNTER — Ambulatory Visit (INDEPENDENT_AMBULATORY_CARE_PROVIDER_SITE_OTHER): Payer: Medicare Other | Admitting: Allergy and Immunology

## 2019-01-16 ENCOUNTER — Ambulatory Visit: Payer: Medicare Other

## 2019-01-16 ENCOUNTER — Encounter: Payer: Self-pay | Admitting: Allergy and Immunology

## 2019-01-16 VITALS — BP 124/68 | HR 58 | Temp 98.3°F | Resp 18

## 2019-01-16 DIAGNOSIS — Z862 Personal history of diseases of the blood and blood-forming organs and certain disorders involving the immune mechanism: Secondary | ICD-10-CM

## 2019-01-16 DIAGNOSIS — J441 Chronic obstructive pulmonary disease with (acute) exacerbation: Secondary | ICD-10-CM

## 2019-01-16 DIAGNOSIS — J3089 Other allergic rhinitis: Secondary | ICD-10-CM | POA: Diagnosis not present

## 2019-01-16 DIAGNOSIS — K219 Gastro-esophageal reflux disease without esophagitis: Secondary | ICD-10-CM

## 2019-01-16 DIAGNOSIS — J4550 Severe persistent asthma, uncomplicated: Secondary | ICD-10-CM | POA: Diagnosis not present

## 2019-01-16 DIAGNOSIS — J45901 Unspecified asthma with (acute) exacerbation: Secondary | ICD-10-CM | POA: Diagnosis not present

## 2019-01-16 MED ORDER — FAMOTIDINE 40 MG PO TABS: 40.0000 mg | ORAL_TABLET | Freq: Every day | ORAL | 5 refills | Status: DC

## 2019-01-16 NOTE — Progress Notes (Signed)
Follow-up Note  Referring Provider: Myer Peer, MD Primary Provider: [facility], [facility] Date of Office Visit: 01/16/2019  Subjective:   [name] (DOB: June 21, 1946) is a 73 y.o. female who returns to the [facility] [place] on 01/16/2019 in re-evaluation of the following:  HPI: [name] returns to this clinic in reevaluation of her COPD with component of asthma, allergic rhinitis, LPR, and history of sarcoidosis.  Her last visit to this clinic was 29 October 2018.  She has done relatively well with her last visit but she informs me that during Christmas 2019 she developed a febrile illness associated with coughing and wheezing for when she went to the urgent care center and was treated with multiple antibiotics and a systemic steroid.  All that pretty much resolved but unfortunately 4 days ago she developed a cough and just felt bad in general and has been laying around all day.  She is not sure that she has had a fever.  She actually feels a little bit better today but she still coughing like crazy.  She has not been having any significant upper airway symptoms or ugly nasal discharge or chest pain or ugly sputum production although occasionally when she coughs real hard she may notice a little bit of green mucus.  Her son has a similar type of illness that has developed over the course of the past 2 weeks or so.  She believes that her reflux is under pretty good control at this point.  Allergies as of 01/16/2019      Reactions   Codeine Nausea And Vomiting   Sulfa Antibiotics Other (See Comments)   Allergic per pt's mother       Medication List      albuterol (2.5 MG/3ML) 0.083% nebulizer solution Commonly known as:  PROVENTIL Take 3 mLs (2.5 mg total) by nebulization every 4 (four) hours as needed for wheezing or shortness of breath.   albuterol 108 (90 Base) MCG/ACT inhaler Commonly known as:  PROVENTIL HFA;VENTOLIN HFA INHALE 2 PUFFS BY MOUTH 4  TIMES A DAY AS NEEDED   amLODipine 5 MG tablet Commonly known as:  NORVASC Take 5 mg by mouth daily.   aspirin EC 81 MG tablet Take 81 mg by mouth daily.   atorvastatin 20 MG tablet Commonly known as:  LIPITOR Take 20 mg by mouth every evening.   beclomethasone 80 MCG/ACT inhaler Commonly known as:  QVAR REDIHALER Inhale two doses twice daily during flare-up.  Rinse, gargle, and spit after use.   cholecalciferol 1000 units tablet Commonly known as:  VITAMIN D Take 1,000 Units by mouth daily.   citalopram 20 MG tablet Commonly known as:  CELEXA Take 20 mg by mouth daily.   dicyclomine 10 MG capsule Commonly known as:  BENTYL Take 10 mg by mouth 2 (two) times daily.   EPINEPHrine 0.3 mg/0.3 mL Soaj injection Commonly known as:  EPI-PEN USE AS DIRECTED FOR LIFE THREATENING ALLERGIC REACTION   fluticasone 50 MCG/ACT nasal spray Commonly known as:  FLONASE Use one spray in each nostril once or twice daily   ICAPS AREDS 2 PO Take 1 capsule by mouth 2 (two) times daily.   ipratropium 0.06 % nasal spray Commonly known as:  ATROVENT Can use one to two sprays in each nostril every six hours if needed to dry up runny nose.   ipratropium-albuterol 0.5-2.5 (3) MG/3ML Soln Commonly known as:  DUONEB Take 3 mLs by nebulization every 4 (four)  hours as needed (shortness of breath).   loratadine 10 MG tablet Commonly known as:  CLARITIN Take 10 mg by mouth daily as needed for allergies.   omalizumab 150 MG injection Commonly known as:  XOLAIR Inject 150 mg into the skin every 28 (twenty-eight) days.   omeprazole 40 MG capsule Commonly known as:  PRILOSEC Take 1 capsule (40 mg total) by mouth 2 (two) times daily.   oxybutynin 10 MG 24 hr tablet Commonly known as:  DITROPAN-XL Take 10 mg by mouth daily.   pioglitazone 30 MG tablet Commonly known as:  ACTOS Take 30 mg by mouth daily.   PROBIOTIC DAILY PO Take 1 capsule by mouth at bedtime.   VICTOZA 18 MG/3ML  Sopn Generic drug:  liraglutide Inject 0.6 mg into the skin daily.   VISINE OP Place 1 drop into both eyes daily as needed (dry eyes).   Vitamin B-12 5000 MCG Lozg Take 5,000 mcg by mouth daily.       Past Medical History:  Diagnosis Date  . Allergy   . Anxiety   . Arthritis   . Asthma   . Cataract    bilateral and removed  . Depression   . Diaphragmatic hernia without mention of obstruction or gangrene   . DM (diabetes mellitus) ([place])   . Dyspnea   . Esophageal reflux   . Esophagitis, unspecified   . H/O adenomatous polyp of colon 2011  . Heart murmur    no cardiologist, family Dr. Maryfrances Bunnell  . History of kidney stones   . Hyperlipidemia   . Incontinence of feces   . Irritable bowel syndrome   . Kidney stones   . OSA (obstructive sleep apnea) 09/28/2018  . Sarcoidosis   . Sleep apnea    no cpap now  . Status post dilation of esophageal narrowing   . Unspecified asthma(493.90)   . Unspecified essential hypertension   . Unspecified hearing loss     Past Surgical History:  Procedure Laterality Date  . BLADDER SURGERY    . CATARACT EXTRACTION Bilateral   . HARDWARE REMOVAL Left 01/12/2017   Procedure: HARDWARE REMOVAL left wrist;  Surgeon: Leanora Cover, MD;  Location: [facility];  Service: Orthopedics;  Laterality: Left;  . HEMORRHOID SURGERY    . NASAL SEPTUM SURGERY    . PARTIAL HYSTERECTOMY    . REVERSE SHOULDER ARTHROPLASTY Left 02/22/2018  . REVERSE SHOULDER ARTHROPLASTY Left 02/22/2018   Procedure: LEFT REVERSE SHOULDER ARTHROPLASTY;  Surgeon: Meredith Pel, MD;  Location: [facility];  Service: Orthopedics;  Laterality: Left;  . TONSILLECTOMY AND ADENOIDECTOMY    . TRIGGER FINGER RELEASE Left 05/12/2016   Procedure: RELEASE TRIGGER FINGER/A-1 PULLEY INFECTION LEFT RING TENDON SHEATH INJECT RIGHT INDEX FINGER;  Surgeon: Leanora Cover, MD;  Location: [facility];  Service: Orthopedics;  Laterality: Left;  . TUBAL LIGATION       Review of systems negative except as noted in HPI / PMHx or noted below:  Review of Systems  Constitutional: Negative.   HENT: Negative.   Eyes: Negative.   Respiratory: Negative.   Cardiovascular: Negative.   Gastrointestinal: Negative.   Genitourinary: Negative.   Musculoskeletal: Negative.   Skin: Negative.   Neurological: Negative.   Endo/Heme/Allergies: Negative.   Psychiatric/Behavioral: Negative.      Objective:   Vitals:   01/16/19 1401  BP: 124/68  Pulse: (!) 58  Resp: 18  Temp: 98.3 F (36.8 C)  SpO2: 98%  Physical Exam Constitutional:      Appearance: She is not diaphoretic.  HENT:     Head: Normocephalic.     Right Ear: Tympanic membrane, ear canal and external ear normal.     Left Ear: Tympanic membrane, ear canal and external ear normal.     Nose: Nose normal. No mucosal edema or rhinorrhea.     Mouth/Throat:     Pharynx: Uvula midline. No oropharyngeal exudate.  Eyes:     Conjunctiva/sclera: Conjunctivae normal.  Neck:     Thyroid: No thyromegaly.     Trachea: Trachea normal. No tracheal tenderness or tracheal deviation.  Cardiovascular:     Rate and Rhythm: Normal rate and regular rhythm.     Heart sounds: Normal heart sounds, S1 normal and S2 normal. No murmur.  Pulmonary:     Effort: No respiratory distress.     Breath sounds: Normal breath sounds. No stridor. No wheezing (Bilateral expiratory wheezes all lung fields) or rales.  Lymphadenopathy:     Head:     Right side of head: No tonsillar adenopathy.     Left side of head: No tonsillar adenopathy.     Cervical: No cervical adenopathy.  Skin:    Findings: No erythema or rash.     Nails: There is no clubbing.   Neurological:     Mental Status: She is alert.     Diagnostics:    Spirometry was performed and demonstrated an FEV1 of 0.92 at 46 % of predicted.    Assessment and Plan:   1. Asthma with COPD with exacerbation ([place])   2. History of sarcoidosis   3.  Other allergic rhinitis   4. LPRD (laryngopharyngeal reflux disease)     1. Continue Xolair and Epi-Pen  2. Continue Symbicort 160 -2 inhalations 2 times per day with spacer  3. Add SAMPLES of Qvar 80 REDIHALER two inhalations two times per day to [facility] during "flare up".  4. Continue Flonase - one spray each nostril 1-2 times per day  5. Continue omeprazole 40 twice a day + Famotidine 40mg  in evening  6. Continue nasal saline, ProAir HFA, Duoneb if needed.  7. Can add OTC Loratadine / Claritin 10 mg one tablet 1-2 times per day if needed  8. Can add OTC Mucinex DM 1-2 tablet 1-2 times per day if needed  9. Can add ipratropium 0.06% nasal spray - 1-2 sprays each nostril every 6 hours if needed  10. Prednisone 10mg  - 2 tablets today, then 2 tablets tomorrow, then 1 tablet one time per day for 10 days  11. Return to clinic in 12 weeks or earlier if problem  12. Blood - ACE, IgA/G/M, anti-pneumo ab, CBC w/ diff  I suspect that Jennetta has a viral respiratory tract trigger giving rise to significant inflammation of her airway.  It should be noted that she has had a difficult time for the past 6 months with multiple respiratory events and I am going to obtain blood tests to just make sure that were not dealing with some new developments such as recrudescence of her sarcoidosis or a week immune system.  I will treat her with another course of relatively low-dose systemic steroids as noted above and she will continue on a very large collection of medications directed against inflammation and I would like for her to aggressively treat her reflux by using famotidine in addition to her proton pump inhibitor.  She did discontinue her ranitidine because of a possible product contamination  issue.  We will see how things go over the course of the next week or so.  If she does well I will see her in this clinic in 12 weeks or earlier if there is a problem.  Allena Katz, MD Allergy / Immunology [place]

## 2019-01-16 NOTE — Patient Instructions (Addendum)
  1. Continue Xolair and Epi-Pen  2. Continue Symbicort 160 -2 inhalations 2 times per day with spacer  3. Add SAMPLES of Qvar 80 REDIHALER two inhalations two times per day to [facility] during "flare up".  4. Continue Flonase - one spray each nostril 1-2 times per day  5. Continue omeprazole 40 twice a day + Famotidine 40mg  in evening  6. Continue nasal saline, ProAir HFA, Duoneb if needed.  7. Can add OTC Loratadine / Claritin 10 mg one tablet 1-2 times per day if needed  8. Can add OTC Mucinex DM 1-2 tablet 1-2 times per day if needed  9. Can add ipratropium 0.06% nasal spray - 1-2 sprays each nostril every 6 hours if needed  10. Prednisone 10mg  - 2 tablets today, then 2 tablets tomorrow, then 1 tablet one time per day for 10 days  11. Return to clinic in 12 weeks or earlier if problem  12. Blood - ACE, IgA/G/M, anti-pneumo ab, CBC w/ diff

## 2019-01-17 ENCOUNTER — Encounter: Payer: Self-pay | Admitting: Allergy and Immunology

## 2019-01-21 ENCOUNTER — Ambulatory Visit: Payer: Medicare Other | Admitting: Allergy and Immunology

## 2019-01-22 LAB — CBC WITH DIFFERENTIAL/PLATELET
Basophils Absolute: 0.1 10*3/uL (ref 0.0–0.2)
Basos: 1 %
EOS (ABSOLUTE): 0.1 10*3/uL (ref 0.0–0.4)
Eos: 1 %
HEMOGLOBIN: 9.9 g/dL — AB (ref 11.1–15.9)
Hematocrit: 32.5 % — ABNORMAL LOW (ref 34.0–46.6)
Immature Grans (Abs): 0 10*3/uL (ref 0.0–0.1)
Immature Granulocytes: 0 %
Lymphocytes Absolute: 0.8 10*3/uL (ref 0.7–3.1)
Lymphs: 12 %
MCH: 25.9 pg — ABNORMAL LOW (ref 26.6–33.0)
MCHC: 30.5 g/dL — ABNORMAL LOW (ref 31.5–35.7)
MCV: 85 fL (ref 79–97)
MONOCYTES: 10 %
Monocytes Absolute: 0.6 10*3/uL (ref 0.1–0.9)
Neutrophils Absolute: 4.8 10*3/uL (ref 1.4–7.0)
Neutrophils: 76 %
Platelets: 298 10*3/uL (ref 150–450)
RBC: 3.82 x10E6/uL (ref 3.77–5.28)
RDW: 14.4 % (ref 11.7–15.4)
WBC: 6.3 10*3/uL (ref 3.4–10.8)

## 2019-01-22 LAB — STREP PNEUMONIAE 23 SEROTYPES IGG
PNEUMO AB TYPE 1: 1 ug/mL — AB (ref >1.3)
PNEUMO AB TYPE 70 (33F): 0.6 ug/mL — AB (ref >1.3)
Pneumo Ab Type 12 (12F)*: 0.3 ug/mL — ABNORMAL LOW (ref >1.3)
Pneumo Ab Type 14*: 4.6 ug/mL (ref >1.3)
Pneumo Ab Type 17 (17F)*: 0.6 ug/mL — ABNORMAL LOW (ref >1.3)
Pneumo Ab Type 19 (19F)*: 3.2 ug/mL (ref >1.3)
Pneumo Ab Type 2*: 0.3 ug/mL — ABNORMAL LOW (ref >1.3)
Pneumo Ab Type 20*: 1.1 ug/mL — ABNORMAL LOW (ref >1.3)
Pneumo Ab Type 22 (22F)*: 0.7 ug/mL — ABNORMAL LOW (ref >1.3)
Pneumo Ab Type 23 (23F)*: 0.9 ug/mL — ABNORMAL LOW (ref >1.3)
Pneumo Ab Type 26 (6B)*: 0.8 ug/mL — ABNORMAL LOW (ref >1.3)
Pneumo Ab Type 3*: 7.8 ug/mL (ref >1.3)
Pneumo Ab Type 34 (10A)*: 1.4 ug/mL (ref >1.3)
Pneumo Ab Type 4*: 0.2 ug/mL — ABNORMAL LOW (ref >1.3)
Pneumo Ab Type 43 (11A)*: 3.3 ug/mL (ref >1.3)
Pneumo Ab Type 5*: 1.8 ug/mL (ref >1.3)
Pneumo Ab Type 51 (7F)*: 0.6 ug/mL — ABNORMAL LOW (ref >1.3)
Pneumo Ab Type 54 (15B)*: 3.8 ug/mL (ref >1.3)
Pneumo Ab Type 56 (18C)*: 0.9 ug/mL — ABNORMAL LOW (ref >1.3)
Pneumo Ab Type 57 (19A)*: 6.4 ug/mL (ref >1.3)
Pneumo Ab Type 68 (9V)*: 1.2 ug/mL — ABNORMAL LOW (ref >1.3)
Pneumo Ab Type 8*: 1.4 ug/mL (ref >1.3)
Pneumo Ab Type 9 (9N)*: 0.9 ug/mL — ABNORMAL LOW (ref >1.3)

## 2019-01-22 LAB — IGG, IGA, IGM
IGA/IMMUNOGLOBULIN A, SERUM: 180 mg/dL (ref 64–422)
IgG (Immunoglobin G), Serum: 816 mg/dL (ref 700–1600)
IgM (Immunoglobulin M), Srm: 131 mg/dL (ref 26–217)

## 2019-01-22 LAB — ANGIOTENSIN CONVERTING ENZYME: ANGIO CONVERT ENZYME: 62 U/L (ref 14–82)

## 2019-01-25 ENCOUNTER — Other Ambulatory Visit: Payer: Self-pay | Admitting: Allergy and Immunology

## 2019-01-31 DIAGNOSIS — Z6830 Body mass index (BMI) 30.0-30.9, adult: Secondary | ICD-10-CM | POA: Diagnosis not present

## 2019-01-31 DIAGNOSIS — D649 Anemia, unspecified: Secondary | ICD-10-CM | POA: Diagnosis not present

## 2019-01-31 DIAGNOSIS — J4540 Moderate persistent asthma, uncomplicated: Secondary | ICD-10-CM | POA: Diagnosis not present

## 2019-02-12 DIAGNOSIS — J4550 Severe persistent asthma, uncomplicated: Secondary | ICD-10-CM

## 2019-02-13 ENCOUNTER — Other Ambulatory Visit: Payer: Self-pay

## 2019-02-13 ENCOUNTER — Ambulatory Visit (INDEPENDENT_AMBULATORY_CARE_PROVIDER_SITE_OTHER): Payer: Medicare Other | Admitting: *Deleted

## 2019-02-13 DIAGNOSIS — J4550 Severe persistent asthma, uncomplicated: Secondary | ICD-10-CM

## 2019-02-20 DIAGNOSIS — J4540 Moderate persistent asthma, uncomplicated: Secondary | ICD-10-CM | POA: Diagnosis not present

## 2019-02-20 DIAGNOSIS — E119 Type 2 diabetes mellitus without complications: Secondary | ICD-10-CM | POA: Diagnosis not present

## 2019-02-20 DIAGNOSIS — Z6830 Body mass index (BMI) 30.0-30.9, adult: Secondary | ICD-10-CM | POA: Diagnosis not present

## 2019-03-12 DIAGNOSIS — J4550 Severe persistent asthma, uncomplicated: Secondary | ICD-10-CM | POA: Diagnosis not present

## 2019-03-13 ENCOUNTER — Ambulatory Visit (INDEPENDENT_AMBULATORY_CARE_PROVIDER_SITE_OTHER): Payer: Medicare Other | Admitting: *Deleted

## 2019-03-13 ENCOUNTER — Other Ambulatory Visit: Payer: Self-pay

## 2019-03-13 DIAGNOSIS — E119 Type 2 diabetes mellitus without complications: Secondary | ICD-10-CM | POA: Diagnosis not present

## 2019-03-13 DIAGNOSIS — J4550 Severe persistent asthma, uncomplicated: Secondary | ICD-10-CM | POA: Diagnosis not present

## 2019-03-13 DIAGNOSIS — J4540 Moderate persistent asthma, uncomplicated: Secondary | ICD-10-CM | POA: Diagnosis not present

## 2019-03-13 DIAGNOSIS — H6691 Otitis media, unspecified, right ear: Secondary | ICD-10-CM | POA: Diagnosis not present

## 2019-03-13 DIAGNOSIS — Z20828 Contact with and (suspected) exposure to other viral communicable diseases: Secondary | ICD-10-CM | POA: Diagnosis not present

## 2019-03-18 ENCOUNTER — Ambulatory Visit (INDEPENDENT_AMBULATORY_CARE_PROVIDER_SITE_OTHER): Payer: Medicare Other | Admitting: Podiatry

## 2019-03-18 ENCOUNTER — Encounter: Payer: Self-pay | Admitting: Podiatry

## 2019-03-18 ENCOUNTER — Other Ambulatory Visit: Payer: Self-pay

## 2019-03-18 VITALS — Temp 99.1°F | Resp 16

## 2019-03-18 DIAGNOSIS — E1151 Type 2 diabetes mellitus with diabetic peripheral angiopathy without gangrene: Secondary | ICD-10-CM | POA: Diagnosis not present

## 2019-03-18 DIAGNOSIS — B351 Tinea unguium: Secondary | ICD-10-CM

## 2019-03-18 DIAGNOSIS — E1169 Type 2 diabetes mellitus with other specified complication: Secondary | ICD-10-CM | POA: Diagnosis not present

## 2019-03-18 NOTE — Progress Notes (Signed)
Subjective:  Patient ID: [name], female    DOB: 31-Aug-1946,  MRN: [id number]  Chief Complaint  Patient presents with  . debride    BL nail tirmming -FBS: 113 A1C: >7 PCP: burckhart x NOv    73 y.o. female presents  for diabetic foot care. Hx as above.  States her husband became positive for COVID-19. Was never tested.   Review of Systems: Negative except as noted in the HPI. Denies N/V/F/Ch.  Past Medical History:  Diagnosis Date  . Allergy   . Anxiety   . Arthritis   . Asthma   . Cataract    bilateral and removed  . Depression   . Diaphragmatic hernia without mention of obstruction or gangrene   . DM (diabetes mellitus) ([place])   . Dyspnea   . Esophageal reflux   . Esophagitis, unspecified   . H/O adenomatous polyp of colon 2011  . Heart murmur    no cardiologist, family Dr. Maryfrances Bunnell  . History of kidney stones   . Hyperlipidemia   . Incontinence of feces   . Irritable bowel syndrome   . Kidney stones   . OSA (obstructive sleep apnea) 09/28/2018  . Sarcoidosis   . Sleep apnea    no cpap now  . Status post dilation of esophageal narrowing   . Unspecified asthma(493.90)   . Unspecified essential hypertension   . Unspecified hearing loss     Current Outpatient Medications:  .  albuterol (PROVENTIL HFA;VENTOLIN HFA) 108 (90 Base) MCG/ACT inhaler, INHALE 2 PUFFS BY MOUTH 4 TIMES A DAY AS NEEDED, Disp: 18 Inhaler, Rfl: 1 .  albuterol (PROVENTIL) (2.5 MG/3ML) 0.083% nebulizer solution, Take 3 mLs (2.5 mg total) by nebulization every 4 (four) hours as needed for wheezing or shortness of breath., Disp: 50 vial, Rfl: 1 .  amLODipine (NORVASC) 5 MG tablet, Take 5 mg by mouth daily., Disp: , Rfl: 2 .  aspirin EC 81 MG tablet, Take 81 mg by mouth daily., Disp: , Rfl:  .  atorvastatin (LIPITOR) 20 MG tablet, Take 20 mg by mouth every evening. , Disp: , Rfl:  .  azithromycin (ZITHROMAX) 250 MG tablet, TAKE 2 TABLETS BY MOUTH TODAY, THEN TAKE 1 TABLET DAILY FOR 4  DAYS, Disp: , Rfl:  .  beclomethasone (QVAR REDIHALER) 80 MCG/ACT inhaler, Inhale two doses twice daily during flare-up.  Rinse, gargle, and spit after use., Disp: 1 Inhaler, Rfl: 0 .  cholecalciferol (VITAMIN D) 1000 units tablet, Take 1,000 Units by mouth daily., Disp: , Rfl:  .  citalopram (CELEXA) 20 MG tablet, Take 20 mg by mouth daily.  , Disp: , Rfl:  .  Cyanocobalamin (VITAMIN B-12) 5000 MCG LOZG, Take 5,000 mcg by mouth daily., Disp: , Rfl:  .  dicyclomine (BENTYL) 10 MG capsule, Take 10 mg by mouth 2 (two) times daily. , Disp: , Rfl:  .  EPINEPHrine 0.3 mg/0.3 mL IJ SOAJ injection, USE AS DIRECTED FOR LIFE THREATENING ALLERGIC REACTION, Disp: , Rfl:  .  famotidine (PEPCID) 40 MG tablet, Take 1 tablet (40 mg total) by mouth at bedtime., Disp: 30 tablet, Rfl: 5 .  fluticasone (FLONASE) 50 MCG/ACT nasal spray, USE ONE SPRAY IN EACH NOSTRIL ONCE OR TWICE DAILY, Disp: 16 g, Rfl: 5 .  ipratropium (ATROVENT) 0.06 % nasal spray, Can use one to two sprays in each nostril every six hours if needed to dry up runny nose., Disp: 15 mL, Rfl: 5 .  ipratropium-albuterol (DUONEB) 0.5-2.5 (3) MG/3ML SOLN, Take  3 mLs by nebulization every 4 (four) hours as needed (shortness of breath). , Disp: , Rfl:  .  levocetirizine (XYZAL) 5 MG tablet, TAKE 1 TABLET BY MOUTH EVERYDAY AT BEDTIME, Disp: , Rfl:  .  liraglutide (VICTOZA) 18 MG/3ML SOPN, Inject 0.6 mg into the skin daily. , Disp: , Rfl:  .  loratadine (CLARITIN) 10 MG tablet, Take 10 mg by mouth daily as needed for allergies., Disp: , Rfl:  .  montelukast (SINGULAIR) 10 MG tablet, Take 10 mg by mouth daily., Disp: , Rfl:  .  Multiple Vitamins-Minerals (ICAPS AREDS 2 PO), Take 1 capsule by mouth 2 (two) times daily., Disp: , Rfl:  .  omalizumab (XOLAIR) 150 MG injection, Inject 150 mg into the skin every 28 (twenty-eight) days. , Disp: , Rfl:  .  omeprazole (PRILOSEC) 40 MG capsule, Take 1 capsule (40 mg total) by mouth 2 (two) times daily., Disp: 60 capsule,  Rfl: 5 .  oxybutynin (DITROPAN-XL) 10 MG 24 hr tablet, Take 10 mg by mouth daily., Disp: , Rfl:  .  pioglitazone (ACTOS) 30 MG tablet, Take 30 mg by mouth daily., Disp: , Rfl:  .  Probiotic Product (PROBIOTIC DAILY PO), Take 1 capsule by mouth at bedtime. , Disp: , Rfl:  .  telmisartan (MICARDIS) 80 MG tablet, Take 80 mg by mouth daily., Disp: , Rfl:  .  Tetrahydrozoline HCl (VISINE OP), Place 1 drop into both eyes daily as needed (dry eyes)., Disp: , Rfl:   Current Facility-Administered Medications:  .  omalizumab Arvid Right) injection 150 mg, 150 mg, Subcutaneous, Q28 days, Kozlow, Donnamarie Poag, MD, 150 mg at 03/13/19 1037  Social History   Tobacco Use  Smoking Status Never Smoker  Smokeless Tobacco Never Used    Allergies  Allergen Reactions  . Codeine Nausea And Vomiting  . Sulfa Antibiotics Other (See Comments)    Allergic per pt's mother    Objective:   Vitals:   03/18/19 1114  Resp: 16  Temp: 99.1 F (37.3 C)   There is no height or weight on file to calculate BMI. Constitutional Well developed. Well nourished.  Vascular Dorsalis pedis pulses present 1+ bilaterally  Posterior tibial pulses absent bilaterally  Pedal hair growth diminished. Capillary refill normal to all digits.  No cyanosis or clubbing noted.  Neurologic Normal speech. Oriented to person, place, and time. Epicritic sensation to light touch grossly present bilaterally. Protective sensation with 5.07 monofilament  present bilaterally. Vibratory sensation present bilaterally.  Dermatologic Nails elongated, thickened, dystrophic. No open wounds. No skin lesions.  Orthopedic: Normal joint ROM without pain or crepitus bilaterally. No visible deformities. No bony tenderness.   Assessment:   1. Onychomycosis of multiple toenails with type 2 diabetes mellitus and peripheral angiopathy ([place])    Plan:  Patient was evaluated and treated and all questions answered.  Diabetes with PAD, Onychomycosis -Routine  foot care as below  Procedure: Nail Debridement Rationale: Patient meets criteria for routine foot care due to PAD Type of Debridement: manual, sharp debridement. Instrumentation: Nail nipper, rotary burr. Number of Nails: 10       No follow-ups on file.

## 2019-03-27 DIAGNOSIS — E119 Type 2 diabetes mellitus without complications: Secondary | ICD-10-CM | POA: Diagnosis not present

## 2019-04-05 DIAGNOSIS — N3001 Acute cystitis with hematuria: Secondary | ICD-10-CM | POA: Diagnosis not present

## 2019-04-09 DIAGNOSIS — J4550 Severe persistent asthma, uncomplicated: Secondary | ICD-10-CM

## 2019-04-10 ENCOUNTER — Other Ambulatory Visit: Payer: Self-pay

## 2019-04-10 ENCOUNTER — Ambulatory Visit (INDEPENDENT_AMBULATORY_CARE_PROVIDER_SITE_OTHER): Payer: Medicare Other | Admitting: *Deleted

## 2019-04-10 DIAGNOSIS — J4550 Severe persistent asthma, uncomplicated: Secondary | ICD-10-CM

## 2019-04-11 ENCOUNTER — Ambulatory Visit (INDEPENDENT_AMBULATORY_CARE_PROVIDER_SITE_OTHER): Payer: Medicare Other | Admitting: Allergy and Immunology

## 2019-04-11 ENCOUNTER — Encounter: Payer: Self-pay | Admitting: Allergy and Immunology

## 2019-04-11 VITALS — BP 140/64 | HR 55 | Temp 97.2°F | Resp 16 | Ht 60.0 in | Wt 170.6 lb

## 2019-04-11 DIAGNOSIS — K219 Gastro-esophageal reflux disease without esophagitis: Secondary | ICD-10-CM

## 2019-04-11 DIAGNOSIS — J3089 Other allergic rhinitis: Secondary | ICD-10-CM | POA: Diagnosis not present

## 2019-04-11 DIAGNOSIS — Z862 Personal history of diseases of the blood and blood-forming organs and certain disorders involving the immune mechanism: Secondary | ICD-10-CM | POA: Diagnosis not present

## 2019-04-11 DIAGNOSIS — J449 Chronic obstructive pulmonary disease, unspecified: Secondary | ICD-10-CM | POA: Diagnosis not present

## 2019-04-11 DIAGNOSIS — J4489 Other specified chronic obstructive pulmonary disease: Secondary | ICD-10-CM

## 2019-04-11 NOTE — Progress Notes (Signed)
[facility] [place] [facility] [place]   Follow-up Note  Referring Provider: Phys[facility], [place] Primary Provider: Physicians, [name] Family Date of Office Visit: 04/11/2019  Subjective:   [name] (DOB: [birth year]) is a 73 y.o. female who returns to the [facility] [place] on 04/11/2019 in re-evaluation of the following:  HPI: [name] returns to this clinic in reevaluation of her COPD with component of asthma, history of sarcoidosis, allergic rhinitis, LPR.  Her last visit to this clinic was 16 January 2019.  Overall she has done well since her last visit.  During that last visit she did require systemic steroid for what appeared to be a viral induced flare of her respiratory tract disease and ever since that event she has very little respiratory tract symptoms.  She has no coughing or wheezing.  She does not really exert herself to any degree.  She does not need to use a short acting bronchodilator.  She has not had any problem with her nose recently.  Her reflux is under excellent control.  Interestingly, her husband had very severe COVID-19 infection and was sick at home with fever and unrelenting cough 1 week prior to hospitalization and was sharing [name]'s Symbicort inhaler.  [name] never developed any respiratory tract issues as a result of that exposure.  Allergies as of 04/11/2019      Reactions   Codeine Nausea And Vomiting   Sulfa Antibiotics Other (See Comments)   Allergic per pt's mother       Medication List    albuterol (2.5 MG/3ML) 0.083% nebulizer solution Commonly known as:  PROVENTIL Take 3 mLs (2.5 mg total) by nebulization every 4 (four) hours as needed for wheezing or shortness of breath.   albuterol 108 (90 Base) MCG/ACT inhaler Commonly known as:  VENTOLIN HFA INHALE 2 PUFFS BY MOUTH 4 TIMES A DAY AS NEEDED   amLODipine 5 MG tablet Commonly known as:  NORVASC Take 5 mg by mouth daily.   aspirin EC 81 MG  tablet Take 81 mg by mouth daily.   atorvastatin 20 MG tablet Commonly known as:  LIPITOR Take 20 mg by mouth every evening.   beclomethasone 80 MCG/ACT inhaler Commonly known as:  Qvar RediHaler Inhale two doses twice daily during flare-up.  Rinse, gargle, and spit after use.   cholecalciferol 1000 units tablet Commonly known as:  VITAMIN D Take 1,000 Units by mouth daily.   citalopram 20 MG tablet Commonly known as:  CELEXA Take 20 mg by mouth daily.   EPINEPHrine 0.3 mg/0.3 mL Soaj injection Commonly known as:  EPI-PEN USE AS DIRECTED FOR LIFE THREATENING ALLERGIC REACTION   famotidine 40 MG tablet Commonly known as:  PEPCID Take 1 tablet (40 mg total) by mouth at bedtime.   fluticasone 50 MCG/ACT nasal spray Commonly known as:  FLONASE USE ONE SPRAY IN EACH NOSTRIL ONCE OR TWICE DAILY   ICAPS AREDS 2 PO Take 1 capsule by mouth 2 (two) times daily.   ipratropium 0.06 % nasal spray Commonly known as:  ATROVENT Can use one to two sprays in each nostril every six hours if needed to dry up runny nose.   ipratropium-albuterol 0.5-2.5 (3) MG/3ML Soln Commonly known as:  DUONEB Take 3 mLs by nebulization every 4 (four) hours as needed (shortness of breath).   levocetirizine 5 MG tablet Commonly known as:  XYZAL TAKE 1 TABLET BY MOUTH EVERYDAY AT BEDTIME   loratadine 10 MG tablet Commonly  known as:  CLARITIN Take 10 mg by mouth daily as needed for allergies.   montelukast 10 MG tablet Commonly known as:  SINGULAIR Take 10 mg by mouth daily.   nitrofurantoin (macrocrystal-monohydrate) 100 MG capsule Commonly known as:  MACROBID   omalizumab 150 MG injection Commonly known as:  XOLAIR Inject 150 mg into the skin every 28 (twenty-eight) days.   omeprazole 40 MG capsule Commonly known as:  PRILOSEC Take 1 capsule (40 mg total) by mouth 2 (two) times daily.   oxybutynin 10 MG 24 hr tablet Commonly known as:  DITROPAN-XL Take 10 mg by mouth daily.    pioglitazone 30 MG tablet Commonly known as:  ACTOS Take 30 mg by mouth daily.   PROBIOTIC DAILY PO Take 1 capsule by mouth at bedtime.   telmisartan 80 MG tablet Commonly known as:  MICARDIS Take 80 mg by mouth daily.   Victoza 18 MG/3ML Sopn Generic drug:  liraglutide Inject 0.6 mg into the skin daily.   VISINE OP Place 1 drop into both eyes daily as needed (dry eyes).   Vitamin B-12 5000 MCG Lozg Take 5,000 mcg by mouth daily.       Past Medical History:  Diagnosis Date  . Allergy   . Anxiety   . Arthritis   . Asthma   . Cataract    bilateral and removed  . Depression   . Diaphragmatic hernia without mention of obstruction or gangrene   . DM (diabetes mellitus) ([place])   . Dyspnea   . Esophageal reflux   . Esophagitis, unspecified   . H/O adenomatous polyp of colon 2011  . Heart murmur    no cardiologist, family Dr. Maryfrances [name]  . History of kidney stones   . Hyperlipidemia   . Incontinence of feces   . Irritable bowel syndrome   . Kidney stones   . OSA (obstructive sleep apnea) 09/28/2018  . Sarcoidosis   . Sleep apnea    no cpap now  . Status post dilation of esophageal narrowing   . Unspecified asthma(493.90)   . Unspecified essential hypertension   . Unspecified hearing loss     Past Surgical History:  Procedure Laterality Date  . BLADDER SURGERY    . CATARACT EXTRACTION Bilateral   . HARDWARE REMOVAL Left 01/12/2017   Procedure: HARDWARE REMOVAL left wrist;  Surgeon: Leanora Cover, MD;  Location: [place];  Service: Orthopedics;  Laterality: Left;  . HEMORRHOID SURGERY    . NASAL SEPTUM SURGERY    . PARTIAL HYSTERECTOMY    . REVERSE SHOULDER ARTHROPLASTY Left 02/22/2018  . REVERSE SHOULDER ARTHROPLASTY Left 02/22/2018   Procedure: LEFT REVERSE SHOULDER ARTHROPLASTY;  Surgeon: Meredith Pel, MD;  Location: [place];  Service: Orthopedics;  Laterality: Left;  . TONSILLECTOMY AND ADENOIDECTOMY    . TRIGGER FINGER RELEASE Left  05/12/2016   Procedure: RELEASE TRIGGER FINGER/A-1 PULLEY INFECTION LEFT RING TENDON SHEATH INJECT RIGHT INDEX FINGER;  Surgeon: Leanora Cover, MD;  Location: [place];  Service: Orthopedics;  Laterality: Left;  . TUBAL LIGATION      Review of systems negative except as noted in HPI / PMHx or noted below:  Review of Systems  Constitutional: Negative.   HENT: Negative.   Eyes: Negative.   Respiratory: Negative.   Cardiovascular: Negative.   Gastrointestinal: Negative.   Genitourinary: Negative.   Musculoskeletal: Negative.   Skin: Negative.   Neurological: Negative.   Endo/Heme/Allergies: Negative.   Psychiatric/Behavioral: Negative.  Objective:   Vitals:   04/11/19 1037  BP: 140/64  Pulse: (!) 55  Resp: 16  Temp: (!) 97.2 F (36.2 C)  SpO2: 98%   Height: 5' (152.4 cm)  Weight: 170 lb 9.6 oz (77.4 kg)   Physical Exam Constitutional:      Appearance: She is not diaphoretic.  HENT:     Head: Normocephalic.     Right Ear: Tympanic membrane, ear canal and external ear normal.     Left Ear: Tympanic membrane, ear canal and external ear normal.     Nose: Nose normal. No mucosal edema or rhinorrhea.     Mouth/Throat:     Pharynx: Uvula midline. No oropharyngeal exudate.  Eyes:     Conjunctiva/sclera: Conjunctivae normal.  Neck:     Thyroid: No thyromegaly.     Trachea: Trachea normal. No tracheal tenderness or tracheal deviation.  Cardiovascular:     Rate and Rhythm: Normal rate and regular rhythm.     Heart sounds: S1 normal and S2 normal. Murmur (Systolic) present.  Pulmonary:     Effort: No respiratory distress.     Breath sounds: Normal breath sounds. No stridor. No wheezing or rales.  Lymphadenopathy:     Head:     Right side of head: No tonsillar adenopathy.     Left side of head: No tonsillar adenopathy.     Cervical: No cervical adenopathy.  Skin:    Findings: No erythema or rash.     Nails: There is no clubbing.   Neurological:      Mental Status: She is alert.     Diagnostics:    Spirometry was performed and demonstrated an FEV1 of 0.94 at 52 % of predicted.  Results of blood tests obtained 16 January 2019 identified IgG 816 mg/DL, IgM 131 mg/DL, IgA 180 mg/DL, WBC 6.3, absolute eosinophil 100, hemoglobin 9.9, MCV 85, platelet 298, and multiple serotypes of pneumococcus without adequate antibody response, ACE 62 U/L  Assessment and Plan:   1. COPD with asthma ([place])   2. History of sarcoidosis   3. Other allergic rhinitis   4. LPRD (laryngopharyngeal reflux disease)     1. Continue Xolair and Epi-Pen  2. Continue Symbicort 160 -2 inhalations 2 times per day with spacer  3. Add SAMPLES of Qvar 80 REDIHALER two inhalations two times per day to Symbicort during "flare up".  4. Continue Flonase - one spray each nostril 1-2 times per day  5. Continue omeprazole 40 twice a day + Famotidine 40mg  in evening  6. Continue nasal saline, ProAir HFA, Duoneb if needed.  7. Can add OTC Loratadine / Claritin 10 mg one tablet 1-2 times per day if needed  8. Can add OTC Mucinex DM 1-2 tablet 1-2 times per day if needed  9. Can add ipratropium 0.06% nasal spray - 1-2 sprays each nostril every 6 hours if needed  10. Return to clinic in August 2020 or earlier if problem  [place] appears to be doing very well at this point in time and I do not think that there is a need to change her medical therapy and she will remain on anti-inflammatory agents for airway and therapy directed against reflux as noted above and assuming she does well I will see her back in this clinic in August 2020 or earlier if there is a problem.  She did have low levels of antibodies directed against pneumococcus and it would be worthwhile for her to consider receiving the Pneumovax during her  next visit with her primary care doctor.  I did have a talk with her today about obtaining that immunization.  Allena Katz, MD Allergy / Immunology [place]

## 2019-04-11 NOTE — Patient Instructions (Addendum)
  1. Continue Xolair and Epi-Pen  2. Continue Symbicort 160 -2 inhalations 2 times per day with spacer  3. Add SAMPLES of Qvar 80 REDIHALER two inhalations two times per day to Symbicort during "flare up".  4. Continue Flonase - one spray each nostril 1-2 times per day  5. Continue omeprazole 40 twice a day + Famotidine 40mg  in evening  6. Continue nasal saline, ProAir HFA, Duoneb if needed.  7. Can add OTC Loratadine / Claritin 10 mg one tablet 1-2 times per day if needed  8. Can add OTC Mucinex DM 1-2 tablet 1-2 times per day if needed  9. Can add ipratropium 0.06% nasal spray - 1-2 sprays each nostril every 6 hours if needed  10. Return to clinic in August 2020 or earlier if problem

## 2019-04-12 DIAGNOSIS — R350 Frequency of micturition: Secondary | ICD-10-CM | POA: Diagnosis not present

## 2019-04-12 DIAGNOSIS — N3946 Mixed incontinence: Secondary | ICD-10-CM | POA: Diagnosis not present

## 2019-04-15 ENCOUNTER — Encounter: Payer: Self-pay | Admitting: Allergy and Immunology

## 2019-04-27 DIAGNOSIS — E119 Type 2 diabetes mellitus without complications: Secondary | ICD-10-CM | POA: Diagnosis not present

## 2019-04-27 DIAGNOSIS — I10 Essential (primary) hypertension: Secondary | ICD-10-CM | POA: Diagnosis not present

## 2019-05-01 DIAGNOSIS — R0602 Shortness of breath: Secondary | ICD-10-CM | POA: Diagnosis not present

## 2019-05-01 DIAGNOSIS — E119 Type 2 diabetes mellitus without complications: Secondary | ICD-10-CM | POA: Diagnosis not present

## 2019-05-01 DIAGNOSIS — R062 Wheezing: Secondary | ICD-10-CM | POA: Diagnosis not present

## 2019-05-01 DIAGNOSIS — Z6832 Body mass index (BMI) 32.0-32.9, adult: Secondary | ICD-10-CM | POA: Diagnosis not present

## 2019-05-03 ENCOUNTER — Telehealth: Payer: Self-pay | Admitting: Cardiology

## 2019-05-03 NOTE — Telephone Encounter (Signed)
Virtual Visit Pre-Appointment Phone Call  "(Name), I am calling you today to discuss your upcoming appointment. We are currently trying to limit exposure to the virus that causes COVID-19 by seeing patients at home rather than in the office."  1. "What is the BEST phone number to call the day of the visit?" - include this in appointment notes  2. Do you have or have access to (through a family member/friend) a smartphone with video capability that we can use for your visit?" a. If yes - list this number in appt notes as cell (if different from BEST phone #) and list the appointment type as a VIDEO visit in appointment notes b. If no - list the appointment type as a PHONE visit in appointment notes  3. Confirm consent - "In the setting of the current Covid19 crisis, you are scheduled for a (phone or video) visit with your provider on (date) at (time).  Just as we do with many in-office visits, in order for you to participate in this visit, we must obtain consent.  If you'd like, I can send this to your mychart (if signed up) or email for you to review.  Otherwise, I can obtain your verbal consent now.  All virtual visits are billed to your insurance company just like a normal visit would be.  By agreeing to a virtual visit, we'd like you to understand that the technology does not allow for your provider to perform an examination, and thus may limit your provider's ability to fully assess your condition. If your provider identifies any concerns that need to be evaluated in person, we will make arrangements to do so.  Finally, though the technology is pretty good, we cannot assure that it will always work on either your or our end, and in the setting of a video visit, we may have to convert it to a phone-only visit.  In either situation, we cannot ensure that we have a secure connection.  Are you willing to proceed?" STAFF: Did the patient verbally acknowledge consent to telehealth visit? Document  YES/NO here: YES  4. Advise patient to be prepared - "Two hours prior to your appointment, go ahead and check your blood pressure, pulse, oxygen saturation, and your weight (if you have the equipment to check those) and write them all down. When your visit starts, your provider will ask you for this information. If you have an Apple Watch or Kardia device, please plan to have heart rate information ready on the day of your appointment. Please have a pen and paper handy nearby the day of the visit as well."  5. Give patient instructions for MyChart download to smartphone OR Doximity/Doxy.me as below if video visit (depending on what platform provider is using)  6. Inform patient they will receive a phone call 15 minutes prior to their appointment time (may be from unknown caller ID) so they should be prepared to answer    TELEPHONE CALL NOTE  [name] has been deemed a candidate for a follow-up tele-health visit to limit community exposure during the Covid-19 pandemic. I spoke with the patient via phone to ensure availability of phone/video source, confirm preferred email & phone number, and discuss instructions and expectations.  I reminded [name] to be prepared with any vital sign and/or heart rhythm information that could potentially be obtained via home monitoring, at the time of her visit. I reminded [name] to expect a phone call prior to  her visit.  Isaiah Blakes 05/03/2019 11:50 AM   INSTRUCTIONS FOR DOWNLOADING THE MYCHART APP TO SMARTPHONE  - The patient must first make sure to have activated MyChart and know their login information - If Apple, go to [employer] and type in MyChart in the search bar and download the app. If Android, ask patient to go to [employer] and type in [place] in the search bar and download the app. The app is free but as with any other app downloads, their phone may require them to verify saved payment information or  Apple/Android password.  - The patient will need to then log into the app with their MyChart username and password, and select [facility] as their healthcare provider to link the account. When it is time for your visit, go to the MyChart app, find appointments, and click Begin Video Visit. Be sure to Select Allow for your device to access the Microphone and Camera for your visit. You will then be connected, and your provider will be with you shortly.  **If they have any issues connecting, or need assistance please contact MyChart service desk [phone])**  **If using a computer, in order to ensure the best quality for their visit they will need to use either of the following Internet Browsers: [employer], or Google Chrome**  IF USING DOXIMITY or DOXY.ME - The patient will receive a link just prior to their visit by text.     FULL LENGTH CONSENT FOR TELE-HEALTH VISIT   I hereby voluntarily request, consent and authorize [facility] and its employed or contracted physicians, physician assistants, nurse practitioners or other licensed health care professionals (the Practitioner), to provide me with telemedicine health care services (the Services") as deemed necessary by the treating Practitioner. I acknowledge and consent to receive the Services by the Practitioner via telemedicine. I understand that the telemedicine visit will involve communicating with the Practitioner through live audiovisual communication technology and the disclosure of certain medical information by electronic transmission. I acknowledge that I have been given the opportunity to request an in-person assessment or other available alternative prior to the telemedicine visit and am voluntarily participating in the telemedicine visit.  I understand that I have the right to withhold or withdraw my consent to the use of telemedicine in the course of my care at any time, without affecting my right to future care  or treatment, and that the Practitioner or I may terminate the telemedicine visit at any time. I understand that I have the right to inspect all information obtained and/or recorded in the course of the telemedicine visit and may receive copies of available information for a reasonable fee.  I understand that some of the potential risks of receiving the Services via telemedicine include:   Delay or interruption in medical evaluation due to technological equipment failure or disruption;  Information transmitted may not be sufficient (e.g. poor resolution of images) to allow for appropriate medical decision making by the Practitioner; and/or   In rare instances, security protocols could fail, causing a breach of personal health information.  Furthermore, I acknowledge that it is my responsibility to provide information about my medical history, conditions and care that is complete and accurate to the best of my ability. I acknowledge that Practitioner's advice, recommendations, and/or decision may be based on factors not within their control, such as incomplete or inaccurate data provided by me or distortions of diagnostic images or specimens that may result from electronic transmissions. I  understand that the practice of medicine is not an exact science and that Practitioner makes no warranties or guarantees regarding treatment outcomes. I acknowledge that I will receive a copy of this consent concurrently upon execution via email to the email address I last provided but may also request a printed copy by calling the office of [facility].    I understand that my insurance will be billed for this visit.   I have read or had this consent read to me.  I understand the contents of this consent, which adequately explains the benefits and risks of the Services being provided via telemedicine.   I have been provided ample opportunity to ask questions regarding this consent and the Services and have had  my questions answered to my satisfaction.  I give my informed consent for the services to be provided through the use of telemedicine in my medical care  By participating in this telemedicine visit I agree to the above.

## 2019-05-06 ENCOUNTER — Telehealth (INDEPENDENT_AMBULATORY_CARE_PROVIDER_SITE_OTHER): Payer: Medicare Other | Admitting: Cardiology

## 2019-05-06 ENCOUNTER — Encounter: Payer: Self-pay | Admitting: Cardiology

## 2019-05-06 ENCOUNTER — Other Ambulatory Visit: Payer: Self-pay

## 2019-05-06 ENCOUNTER — Telehealth: Payer: Self-pay

## 2019-05-06 VITALS — Ht 60.0 in | Wt 165.0 lb

## 2019-05-06 DIAGNOSIS — R0600 Dyspnea, unspecified: Secondary | ICD-10-CM

## 2019-05-06 DIAGNOSIS — I10 Essential (primary) hypertension: Secondary | ICD-10-CM

## 2019-05-06 DIAGNOSIS — R0609 Other forms of dyspnea: Secondary | ICD-10-CM

## 2019-05-06 DIAGNOSIS — R600 Localized edema: Secondary | ICD-10-CM | POA: Insufficient documentation

## 2019-05-06 DIAGNOSIS — G4733 Obstructive sleep apnea (adult) (pediatric): Secondary | ICD-10-CM

## 2019-05-06 DIAGNOSIS — I350 Nonrheumatic aortic (valve) stenosis: Secondary | ICD-10-CM

## 2019-05-06 DIAGNOSIS — E785 Hyperlipidemia, unspecified: Secondary | ICD-10-CM

## 2019-05-06 DIAGNOSIS — J431 Panlobular emphysema: Secondary | ICD-10-CM

## 2019-05-06 HISTORY — DX: Dyspnea, unspecified: R06.00

## 2019-05-06 HISTORY — DX: Localized edema: R60.0

## 2019-05-06 HISTORY — DX: Other forms of dyspnea: R06.09

## 2019-05-06 NOTE — Progress Notes (Signed)
Virtual Visit via Telephone Note   This visit type was conducted due to national recommendations for restrictions regarding the COVID-19 Pandemic (e.g. social distancing) in an effort to limit this patient's exposure and mitigate transmission in our community.  Due to her co-morbid illnesses, this patient is at least at moderate risk for complications without adequate follow up.  This format is felt to be most appropriate for this patient at this time.  The patient did not have access to video technology/had technical difficulties with video requiring transitioning to audio format only (telephone).  All issues noted in this document were discussed and addressed.  No physical exam could be performed with this format.  Please refer to the patient's chart for her  consent to telehealth for [facility].   Date:  05/06/2019   ID:  [name], DOB [birth year], MRN [id number]  Patient Location: Home Provider Location: Home  PCP:  [facility]  Cardiologist:  No primary care provider on file.  Electrophysiologist:  None   Evaluation Performed:  Consultation - AYEN VIVIANO was referred by Dr Jeryl Columbia for the evaluation of chest discomfort and dyspnea on exertion.  Chief Complaint: Dyspnea on exertion  History of Present Illness:    [name] is a 73 y.o. female with past medical history of essential hypertension and record review reveals atherosclerotic vascular disease with carotid atherosclerosis and mild aortic stenosis in the remote past.  Patient wanted her evaluation by phone.  She mentions to me that she has been having dyspnea on exertion.  This is been going on for the past several months.  She also has some chest tightness at times when it relates to exertion.  No orthopnea or PND.  No syncope.  At the time of my evaluation, the patient is alert awake oriented and in no distress.  Patient also mentions of having some pedal edema at times.  The patient does  not have symptoms concerning for COVID-19 infection (fever, chills, cough, or new shortness of breath).    Past Medical History:  Diagnosis Date  . Allergy   . Anxiety   . Arthritis   . Asthma   . Cataract    bilateral and removed  . Depression   . Diaphragmatic hernia without mention of obstruction or gangrene   . DM (diabetes mellitus) ([place])   . Dyspnea   . Esophageal reflux   . Esophagitis, unspecified   . H/O adenomatous polyp of colon 2011  . Heart murmur    no cardiologist, family Dr. Maryfrances Bunnell  . History of kidney stones   . Hyperlipidemia   . Incontinence of feces   . Irritable bowel syndrome   . Kidney stones   . OSA (obstructive sleep apnea) 09/28/2018  . Sarcoidosis   . Sleep apnea    no cpap now  . Status post dilation of esophageal narrowing   . Unspecified asthma(493.90)   . Unspecified essential hypertension   . Unspecified hearing loss    Past Surgical History:  Procedure Laterality Date  . BLADDER SURGERY    . CATARACT EXTRACTION Bilateral   . HARDWARE REMOVAL Left 01/12/2017   Procedure: HARDWARE REMOVAL left wrist;  Surgeon: Leanora Cover, MD;  Location: [facility];  Service: Orthopedics;  Laterality: Left;  . HEMORRHOID SURGERY    . NASAL SEPTUM SURGERY    . PARTIAL HYSTERECTOMY    . REVERSE SHOULDER ARTHROPLASTY Left 02/22/2018  . REVERSE SHOULDER ARTHROPLASTY Left 02/22/2018  Procedure: LEFT REVERSE SHOULDER ARTHROPLASTY;  Surgeon: Meredith Pel, MD;  Location: [facility];  Service: Orthopedics;  Laterality: Left;  . TONSILLECTOMY AND ADENOIDECTOMY    . TRIGGER FINGER RELEASE Left 05/12/2016   Procedure: RELEASE TRIGGER FINGER/A-1 PULLEY INFECTION LEFT RING TENDON SHEATH INJECT RIGHT INDEX FINGER;  Surgeon: Leanora Cover, MD;  Location: [facility];  Service: Orthopedics;  Laterality: Left;  . TUBAL LIGATION       Current Meds  Medication Sig  . albuterol (PROVENTIL HFA;VENTOLIN HFA) 108 (90 Base) MCG/ACT inhaler  INHALE 2 PUFFS BY MOUTH 4 TIMES A DAY AS NEEDED  . albuterol (PROVENTIL) (2.5 MG/3ML) 0.083% nebulizer solution Take 3 mLs (2.5 mg total) by nebulization every 4 (four) hours as needed for wheezing or shortness of breath.  Marland Kitchen amLODipine (NORVASC) 5 MG tablet Take 5 mg by mouth daily.  Marland Kitchen aspirin EC 81 MG tablet Take 81 mg by mouth daily.  Marland Kitchen atorvastatin (LIPITOR) 20 MG tablet Take 20 mg by mouth every evening.   . beclomethasone (QVAR REDIHALER) 80 MCG/ACT inhaler Inhale two doses twice daily during flare-up.  Rinse, gargle, and spit after use.  . cholecalciferol (VITAMIN D) 1000 units tablet Take 1,000 Units by mouth daily.  . citalopram (CELEXA) 20 MG tablet Take 20 mg by mouth daily.    . Cyanocobalamin (VITAMIN B-12) 5000 MCG LOZG Take 5,000 mcg by mouth daily.  Marland Kitchen EPINEPHrine 0.3 mg/0.3 mL IJ SOAJ injection USE AS DIRECTED FOR LIFE THREATENING ALLERGIC REACTION  . fluticasone (FLONASE) 50 MCG/ACT nasal spray USE ONE SPRAY IN EACH NOSTRIL ONCE OR TWICE DAILY  . furosemide (LASIX) 20 MG tablet Take 20 mg by mouth 2 (two) times daily.  Marland Kitchen levocetirizine (XYZAL) 5 MG tablet TAKE 1 TABLET BY MOUTH EVERYDAY AT BEDTIME  . liraglutide (VICTOZA) 18 MG/3ML SOPN Inject 0.6 mg into the skin daily.   Marland Kitchen loratadine (CLARITIN) 10 MG tablet Take 10 mg by mouth daily as needed for allergies.  . montelukast (SINGULAIR) 10 MG tablet Take 10 mg by mouth daily.  . Multiple Vitamins-Minerals (ICAPS AREDS 2 PO) Take 1 capsule by mouth 2 (two) times daily.  Marland Kitchen omalizumab (XOLAIR) 150 MG injection Inject 150 mg into the skin every 28 (twenty-eight) days.   Marland Kitchen omeprazole (PRILOSEC) 40 MG capsule Take 1 capsule (40 mg total) by mouth 2 (two) times daily.  . pioglitazone (ACTOS) 30 MG tablet Take 30 mg by mouth daily.  . Probiotic Product (PROBIOTIC DAILY PO) Take 1 capsule by mouth at bedtime.   Marland Kitchen telmisartan (MICARDIS) 80 MG tablet Take 80 mg by mouth daily.  . Tetrahydrozoline HCl (VISINE OP) Place 1 drop into both eyes  daily as needed (dry eyes).   Current Facility-Administered Medications for the 05/06/19 encounter (Telemedicine) with Bhumi Godbey, Reita Cliche, MD  Medication  . omalizumab Arvid Right) injection 150 mg     Allergies:   Codeine and Sulfa antibiotics   Social History   Tobacco Use  . Smoking status: Never Smoker  . Smokeless tobacco: Never Used  Substance Use Topics  . Alcohol use: No  . Drug use: No     Family Hx: The patient's family history includes Diabetes in her maternal grandmother and mother; Esophageal cancer in her father. There is no history of Colon cancer, Rectal cancer, or Stomach cancer.  ROS:   Please see the history of present illness.    As mentioned above All other systems reviewed and are negative.   Prior CV studies:  The following studies were reviewed today:  Remote evaluation reveals carotid atherosclerosis and mild aortic stenosis  Labs/Other Tests and Data Reviewed:    EKG:  EKG done at primary care doctor's office in the month of May revealed sinus rhythm and nonspecific ST-T changes.  They faxed quality was not totally legible.  Recent Labs: 01/16/2019: Hemoglobin 9.9; Platelets 298   Recent Lipid Panel No results found for: CHOL, TRIG, HDL, CHOLHDL, LDLCALC, LDLDIRECT  Wt Readings from Last 3 Encounters:  05/06/19 165 lb (74.8 kg)  04/11/19 170 lb 9.6 oz (77.4 kg)  12/07/18 165 lb (74.8 kg)     Objective:    Vital Signs:  Ht 5' (1.524 m)   Wt 165 lb (74.8 kg)   BMI 32.22 kg/m    VITAL SIGNS:  reviewed  ASSESSMENT & PLAN:    1. Dyspnea on exertion: The patient has history of mild aortic stenosis in the remote past.  In view of this I will set up for a echocardiogram to assess the aortic stenosis.  I will initiate this and do it in the next week or 2.  This will help me assess her valvular pathology and subsequently evaluate her shortness of breath further.  She will also need evaluation for coronary artery disease as a cause of the symptoms.   I told her not to exert herself in the interim till this evaluation is available. 2. Essential hypertension: Blood pressure is stable 3. Patient has history of sarcoidosis and this is managed by her primary care physician and pulmonologist.   COVID-19 Education: The signs and symptoms of COVID-19 were discussed with the patient and how to seek care for testing (follow up with PCP or arrange E-visit).  The importance of social distancing was discussed today.  Time:   Today, I have spent 23 minutes with the patient with telehealth technology discussing the above problems.     Medication Adjustments/Labs and Tests Ordered: Current medicines are reviewed at length with the patient today.  Concerns regarding medicines are outlined above.   Tests Ordered: No orders of the defined types were placed in this encounter.   Medication Changes: No orders of the defined types were placed in this encounter.   Disposition:  Follow up in 1 month(s)  Signed, Jenean Lindau, MD  05/06/2019 9:59 AM    [place]

## 2019-05-06 NOTE — Telephone Encounter (Signed)
Left message to fax Dr. Roberts Gaudy most recent office note, labs and EKG

## 2019-05-07 DIAGNOSIS — J4550 Severe persistent asthma, uncomplicated: Secondary | ICD-10-CM | POA: Diagnosis not present

## 2019-05-08 ENCOUNTER — Ambulatory Visit (INDEPENDENT_AMBULATORY_CARE_PROVIDER_SITE_OTHER): Payer: Medicare Other | Admitting: *Deleted

## 2019-05-08 ENCOUNTER — Other Ambulatory Visit: Payer: Self-pay

## 2019-05-08 DIAGNOSIS — J4550 Severe persistent asthma, uncomplicated: Secondary | ICD-10-CM

## 2019-05-28 DIAGNOSIS — E119 Type 2 diabetes mellitus without complications: Secondary | ICD-10-CM | POA: Diagnosis not present

## 2019-05-28 DIAGNOSIS — I10 Essential (primary) hypertension: Secondary | ICD-10-CM | POA: Diagnosis not present

## 2019-06-04 DIAGNOSIS — J4550 Severe persistent asthma, uncomplicated: Secondary | ICD-10-CM

## 2019-06-05 ENCOUNTER — Other Ambulatory Visit: Payer: Self-pay

## 2019-06-05 ENCOUNTER — Ambulatory Visit (INDEPENDENT_AMBULATORY_CARE_PROVIDER_SITE_OTHER): Payer: Medicare Other | Admitting: *Deleted

## 2019-06-05 DIAGNOSIS — J4550 Severe persistent asthma, uncomplicated: Secondary | ICD-10-CM | POA: Diagnosis not present

## 2019-06-06 ENCOUNTER — Encounter: Payer: Self-pay | Admitting: Pulmonary Disease

## 2019-06-06 ENCOUNTER — Other Ambulatory Visit: Payer: Self-pay

## 2019-06-06 ENCOUNTER — Ambulatory Visit (INDEPENDENT_AMBULATORY_CARE_PROVIDER_SITE_OTHER): Payer: Medicare Other | Admitting: Pulmonary Disease

## 2019-06-06 VITALS — BP 142/70 | HR 61 | Temp 97.8°F | Ht 60.0 in | Wt 172.6 lb

## 2019-06-06 DIAGNOSIS — J449 Chronic obstructive pulmonary disease, unspecified: Secondary | ICD-10-CM

## 2019-06-06 DIAGNOSIS — G4733 Obstructive sleep apnea (adult) (pediatric): Secondary | ICD-10-CM | POA: Diagnosis not present

## 2019-06-06 DIAGNOSIS — I272 Pulmonary hypertension, unspecified: Secondary | ICD-10-CM | POA: Diagnosis not present

## 2019-06-06 DIAGNOSIS — D869 Sarcoidosis, unspecified: Secondary | ICD-10-CM

## 2019-06-06 NOTE — Progress Notes (Signed)
[name]  Chief Complaint  Patient presents with  . Follow-up    DME [facility], cpap working well, denies problems    Constitutional:  BP (!) 142/70 (BP Location: Right Arm, Cuff Size: Normal)   Pulse 61   Temp 97.8 F (36.6 C) (Oral)   Ht 5' (1.524 m)   Wt 172 lb 9.6 oz (78.3 kg)   SpO2 99%   BMI 33.71 kg/m   Past Medical History:  Allergies, Anxiety, Depression, Hiatal hernia, DM, GERD, Colon polyp, Nephrolithiasis, HLD, IBS, Sarcoidosis, HTN  Brief Summary:  [name] is a 73 y.o. female with obstructive sleep apnea, COPD with asthma, and sarcoidosis.  Using CPAP w/o difficulty.  Has nasal mask.  Not having dry mouth or sore throat.  Has chronic runny nose, but no worse than usual.  Not having cough, wheeze, phlegm, hemoptysis.  Gets winded when doing strenuous activity.  Occasionally gets chest discomfort.  Was started on diuretic by PCP for leg swelling.  Had referral to cardiology, but hasn't heard about appointment being scheduled.  Had seen Dr. Wayne Both before.   Physical Exam:   Appearance - well kempt   ENMT - no sinus tenderness, no nasal discharge, no oral exudate, narrow nasal angles, dry mucosa, high arched palate, MP 3  Neck - no masses, trachea midline, no thyromegaly, no elevation in JVP  Respiratory - normal appearance of chest wall, normal respiratory effort w/o accessory muscle use, no dullness on percussion, no wheezing or rales  CV - s1s2 regular rate and rhythm, no murmurs, no peripheral edema, radial pulses symmetric  GI - soft, non tender  Lymph - no adenopathy noted in neck and axillary areas  MSK - normal gait  Ext - no cyanosis, clubbing, or joint inflammation noted  Skin - no rashes, lesions, or ulcers  Neuro - normal strength, oriented x 3  Psych - normal mood and affect   Assessment/Plan:   Obstructive sleep apnea. - she is compliant with CPAP and reports benefit - continue  auto CPAP  COPD with asthma. - she is followed by Dr. Neldon Mc for xolair - uses Qvar prn when symptoms get worse - continue albuterol, singulair, flonase, xyzal, singulair  History of pulmonary sarcoidosis with bronchiectasis. - monitor clinically  Dyspnea on exertion with intermittent chest pain. - reports her PCP has referred her to cardiology - advised her to contact cardiologist's office to ensure appointment has been scheduled  Pulmonary hypertension. - she reports being scheduled for repeat Echo >> need to compare to Echo from 2019, and if progressed then might need RHC   Patient Instructions  Follow up in 1 year   Chesley Mires, MD [place] Pager: [phone] 06/06/2019, 10:00 AM  Flow Sheet     Pulmonary tests:  Lymph node bx 04/27/01 >> non-caseating granuloma Spirometry 08/06/18 >> FEV1 0.79 (42%), FEV1% 65 HRCT chest 09/28/18 >> atherosclerosis, 6 mm nodule RML, 10 mm nodule LUL both stable since 2016; calcified granulomas, mild patchy air trapping, moderate varicoid BTX, mild subpleural reticulation PFT 11/02/18 >> FEV1 1.03 (53%), FEV1% 65, TLC 3.45 (76%), DLCO 60%, + BD  Sleep tests:  HST 09/27/18 >> AHI 32.4, SpO2 low 76%. Auto CPAP 05/06/19 to 06/04/19 >> used on 30 of 30 nights with average 6 hrs 27 min.  Average AHI 5.4 with median CPAP 9 and 95 th percentile CPAP 12 cm H2O  Cardiac tests:  Echo 01/12/18 >> EF 55 to 60%, grade 1  DD, mild AS/AR, mild MR, PAS 49 mmHg  Medications:   Allergies as of 06/06/2019      Reactions   Codeine Nausea And Vomiting   Sulfa Antibiotics Other (See Comments)   Allergic per pt's mother       Medication List       Accurate as of June 06, 2019 10:00 AM. If you have any questions, ask your nurse or doctor.        albuterol (2.5 MG/3ML) 0.083% nebulizer solution Commonly known as: PROVENTIL Take 3 mLs (2.5 mg total) by nebulization every 4 (four) hours as needed for wheezing or shortness of breath.    albuterol 108 (90 Base) MCG/ACT inhaler Commonly known as: VENTOLIN HFA INHALE 2 PUFFS BY MOUTH 4 TIMES A DAY AS NEEDED   amLODipine 5 MG tablet Commonly known as: NORVASC Take 5 mg by mouth daily.   aspirin EC 81 MG tablet Take 81 mg by mouth daily.   atorvastatin 20 MG tablet Commonly known as: LIPITOR Take 20 mg by mouth every evening.   beclomethasone 80 MCG/ACT inhaler Commonly known as: Qvar RediHaler Inhale two doses twice daily during flare-up.  Rinse, gargle, and spit after use.   cholecalciferol 1000 units tablet Commonly known as: VITAMIN D Take 1,000 Units by mouth daily.   citalopram 20 MG tablet Commonly known as: CELEXA Take 20 mg by mouth daily.   EPINEPHrine 0.3 mg/0.3 mL Soaj injection Commonly known as: EPI-PEN USE AS DIRECTED FOR LIFE THREATENING ALLERGIC REACTION   fluticasone 50 MCG/ACT nasal spray Commonly known as: FLONASE USE ONE SPRAY IN EACH NOSTRIL ONCE OR TWICE DAILY   furosemide 20 MG tablet Commonly known as: LASIX Take 20 mg by mouth 2 (two) times daily.   ICAPS AREDS 2 PO Take 1 capsule by mouth 2 (two) times daily.   levocetirizine 5 MG tablet Commonly known as: XYZAL TAKE 1 TABLET BY MOUTH EVERYDAY AT BEDTIME   loratadine 10 MG tablet Commonly known as: CLARITIN Take 10 mg by mouth daily as needed for allergies.   montelukast 10 MG tablet Commonly known as: SINGULAIR Take 10 mg by mouth daily.   omalizumab 150 MG injection Commonly known as: XOLAIR Inject 150 mg into the skin every 28 (twenty-eight) days.   omeprazole 40 MG capsule Commonly known as: PRILOSEC Take 1 capsule (40 mg total) by mouth 2 (two) times daily.   pioglitazone 30 MG tablet Commonly known as: ACTOS Take 30 mg by mouth daily.   PROBIOTIC DAILY PO Take 1 capsule by mouth at bedtime.   telmisartan 80 MG tablet Commonly known as: MICARDIS Take 80 mg by mouth daily.   Victoza 18 MG/3ML Sopn Generic drug: liraglutide Inject 0.6 mg into the  skin daily.   VISINE OP Place 1 drop into both eyes daily as needed (dry eyes).   Vitamin B-12 5000 MCG Lozg Take 5,000 mcg by mouth daily.       Past Surgical History:  She  has a past surgical history that includes Partial hysterectomy; Tonsillectomy and adenoidectomy; Nasal septum surgery; Tubal ligation; Hemorrhoid surgery; Bladder surgery; Cataract extraction (Bilateral); Trigger finger release (Left, 05/12/2016); Hardware Removal (Left, 01/12/2017); Reverse shoulder arthroplasty (Left, 02/22/2018); and Reverse shoulder arthroplasty (Left, 02/22/2018).  Family History:  Her family history includes Diabetes in her maternal grandmother and mother; Esophageal cancer in her father.  Social History:  She  reports that she has never smoked. She has never used smokeless tobacco. She reports that she does not drink  alcohol or use drugs.

## 2019-06-06 NOTE — Patient Instructions (Signed)
Follow up in 1 year.

## 2019-06-07 DIAGNOSIS — N3946 Mixed incontinence: Secondary | ICD-10-CM | POA: Diagnosis not present

## 2019-06-07 DIAGNOSIS — R350 Frequency of micturition: Secondary | ICD-10-CM | POA: Diagnosis not present

## 2019-06-17 ENCOUNTER — Ambulatory Visit: Payer: Medicare Other | Admitting: Podiatry

## 2019-06-18 ENCOUNTER — Telehealth: Payer: Self-pay | Admitting: Emergency Medicine

## 2019-06-18 DIAGNOSIS — R0602 Shortness of breath: Secondary | ICD-10-CM | POA: Diagnosis not present

## 2019-06-18 DIAGNOSIS — I350 Nonrheumatic aortic (valve) stenosis: Secondary | ICD-10-CM | POA: Diagnosis not present

## 2019-06-18 DIAGNOSIS — E119 Type 2 diabetes mellitus without complications: Secondary | ICD-10-CM | POA: Diagnosis not present

## 2019-06-18 DIAGNOSIS — R062 Wheezing: Secondary | ICD-10-CM | POA: Diagnosis not present

## 2019-06-18 NOTE — Telephone Encounter (Signed)
[facility] family called today stating the patient was in their office being seen with shortness of breath. She asked them to call to report that nobody has ever got her echo scheduled yet from her last visit. No avs seen in discharge. Will forward to his team.

## 2019-06-19 NOTE — Telephone Encounter (Signed)
No echo openings until Sept in [place] office. Attempted to get patient appt moved to [place] but she has scheduling conflict this week and will be out of town next week. Patient given phone# for scheduling echo and f/u is scheduled 07/05/19.

## 2019-06-19 NOTE — Patient Instructions (Signed)
Medication Instructions:  Your physician recommends that you continue on your current medications as directed. Please refer to the Current Medication list given to you today.  If you need a refill on your cardiac medications before your next appointment, please call your pharmacy.   Lab work: NONE If you have labs (blood work) drawn today and your tests are completely normal, you will receive your results only by: Marland Kitchen MyChart Message (if you have MyChart) OR . A paper copy in the mail If you have any lab test that is abnormal or we need to change your treatment, we will call you to review the results.  Testing/Procedures: Your physician has requested that you have an echocardiogram. Echocardiography is a painless test that uses sound waves to create images of your heart. It provides your doctor with information about the size and shape of your heart and how well your heart's chambers and valves are working. This procedure takes approximately one hour. There are no restrictions for this procedure.    Follow-Up: At [facility], you and your health needs are our priority.  As part of our continuing mission to provide you with exceptional heart care, we have created designated Provider Care Teams.  These Care Teams include your primary Cardiologist (physician) and Advanced Practice Providers (APPs -  Physician Assistants and Nurse Practitioners) who all work together to provide you with the care you need, when you need it. You are scheduled for 1 mo f/u on 07/05/19 at 2:40 pm with Dr. Geraldo Pitter  Any Other Special Instructions Will Be Listed Below   Echocardiogram An echocardiogram is a procedure that uses painless sound waves (ultrasound) to produce an image of the heart. Images from an echocardiogram can provide important information about:  Signs of coronary artery disease (CAD).  Aneurysm detection. An aneurysm is a weak or damaged part of an artery wall that bulges out from the normal  force of blood pumping through the body.  Heart size and shape. Changes in the size or shape of the heart can be associated with certain conditions, including heart failure, aneurysm, and CAD.  Heart muscle function.  Heart valve function.  Signs of a past heart attack.  Fluid buildup around the heart.  Thickening of the heart muscle.  A tumor or infectious growth around the heart valves. Tell a health care provider about:  Any allergies you have.  All medicines you are taking, including vitamins, herbs, eye drops, creams, and over-the-counter medicines.  Any blood disorders you have.  Any surgeries you have had.  Any medical conditions you have.  Whether you are pregnant or may be pregnant. What are the risks? Generally, this is a safe procedure. However, problems may occur, including:  Allergic reaction to dye (contrast) that may be used during the procedure. What happens before the procedure? No specific preparation is needed. You may eat and drink normally. What happens during the procedure?   An IV tube may be inserted into one of your veins.  You may receive contrast through this tube. A contrast is an injection that improves the quality of the pictures from your heart.  A gel will be applied to your chest.  A wand-like tool (transducer) will be moved over your chest. The gel will help to transmit the sound waves from the transducer.  The sound waves will harmlessly bounce off of your heart to allow the heart images to be captured in real-time motion. The images will be recorded on a computer. The procedure  may vary among health care providers and hospitals. What happens after the procedure?  You may return to your normal, everyday life, including diet, activities, and medicines, unless your health care provider tells you not to do that. Summary  An echocardiogram is a procedure that uses painless sound waves (ultrasound) to produce an image of the heart.   Images from an echocardiogram can provide important information about the size and shape of your heart, heart muscle function, heart valve function, and fluid buildup around your heart.  You do not need to do anything to prepare before this procedure. You may eat and drink normally.  After the echocardiogram is completed, you may return to your normal, everyday life, unless your health care provider tells you not to do that. This information is not intended to replace advice given to you by your health care provider. Make sure you discuss any questions you have with your health care provider. Document Released: 11/11/2000 Document Revised: 03/07/2019 Document Reviewed: 12/17/2016 Elsevier Patient Education  2020 [employer].

## 2019-06-19 NOTE — Addendum Note (Signed)
Addended by: Beckey Rutter on: 06/19/2019 10:50 AM   Modules accepted: Orders

## 2019-06-21 ENCOUNTER — Encounter: Payer: Self-pay | Admitting: Sports Medicine

## 2019-06-21 ENCOUNTER — Ambulatory Visit (INDEPENDENT_AMBULATORY_CARE_PROVIDER_SITE_OTHER): Payer: Medicare Other | Admitting: Sports Medicine

## 2019-06-21 ENCOUNTER — Other Ambulatory Visit: Payer: Self-pay

## 2019-06-21 DIAGNOSIS — M79674 Pain in right toe(s): Secondary | ICD-10-CM

## 2019-06-21 DIAGNOSIS — M79675 Pain in left toe(s): Secondary | ICD-10-CM | POA: Diagnosis not present

## 2019-06-21 DIAGNOSIS — B351 Tinea unguium: Secondary | ICD-10-CM | POA: Diagnosis not present

## 2019-06-21 DIAGNOSIS — E1151 Type 2 diabetes mellitus with diabetic peripheral angiopathy without gangrene: Secondary | ICD-10-CM | POA: Diagnosis not present

## 2019-06-21 NOTE — Progress Notes (Signed)
Subjective: [name] is a 73 y.o. female patient with history of diabetes who presents to office today complaining of long,mildly painful nails  while ambulating in shoes; unable to trim. Patientstates that the glucose reading this morning was 148mg /dl.  Does not recall last A1c reports that as far she knows it has been good.  Patient denies any new changes in medication or new problems.  Saw her PCP on Tuesday.  No other issues noted.  Patient Active Problem List   Diagnosis Date Noted  . Pedal edema 05/06/2019  . DOE (dyspnea on exertion) 05/06/2019  . Aortic stenosis 05/06/2019  . Hearing loss, mixed, bilateral 12/17/2018  . Bilateral primary osteoarthritis of knee 10/16/2018  . OSA (obstructive sleep apnea) 09/28/2018  . Shoulder arthritis 02/22/2018  . Primary osteoarthritis, left shoulder 12/11/2017  . DDD (degenerative disc disease), lumbar 01/23/2017  . History of diabetes mellitus 01/23/2017  . History of depression 01/23/2017  . History of asthma 01/23/2017  . Dyslipidemia 01/23/2017  . Age-related osteoporosis without current pathological fracture 01/23/2017  . History of gastroesophageal reflux (GERD) 01/17/2017  . Primary osteoarthritis of both knees 01/17/2017  . Primary osteoarthritis of both feet 01/17/2017  . Vitamin D deficiency 01/17/2017  . Rheumatoid factor positive 12/13/2016  . Primary osteoarthritis of both hands 12/13/2016  . Retained orthopedic hardware 11/14/2016  . Pain in left wrist 06/06/2016  . Stiffness of left wrist joint 06/06/2016  . Closed fracture of distal end of radius 06/05/2016  . Acquired trigger finger 04/20/2016  . Obstructive lung disease ([place]) 07/29/2015  . H/O allergic rhinitis 07/29/2015  . LPRD (laryngopharyngeal reflux disease) 07/29/2015  . Chest pain 11/01/2013  . Murmur 11/01/2013  . DM 03/03/2010  . ABDOMINAL PAIN RIGHT LOWER QUADRANT 03/03/2010  . FULL INCONTINENCE OF FECES 03/03/2010  . DECREASED HEARING 05/02/2008   . Essential hypertension 05/02/2008  . ESOPHAGITIS 05/02/2008  . HIATAL HERNIA 05/02/2008  . RECTAL INCONTINENCE 05/02/2008  . SARCOIDOSIS 11/16/2007  . Severe persistent asthma 11/16/2007  . GERD 11/16/2007  . IRRITABLE BOWEL SYNDROME 11/16/2007   Current Outpatient Medications on File Prior to Visit  Medication Sig Dispense Refill  . albuterol (PROVENTIL HFA;VENTOLIN HFA) 108 (90 Base) MCG/ACT inhaler INHALE 2 PUFFS BY MOUTH 4 TIMES A DAY AS NEEDED 18 Inhaler 1  . albuterol (PROVENTIL) (2.5 MG/3ML) 0.083% nebulizer solution Take 3 mLs (2.5 mg total) by nebulization every 4 (four) hours as needed for wheezing or shortness of breath. 50 vial 1  . amLODipine (NORVASC) 5 MG tablet Take 5 mg by mouth daily.  2  . aspirin EC 81 MG tablet Take 81 mg by mouth daily.    Marland Kitchen atorvastatin (LIPITOR) 20 MG tablet Take 20 mg by mouth every evening.     . beclomethasone (QVAR REDIHALER) 80 MCG/ACT inhaler Inhale two doses twice daily during flare-up.  Rinse, gargle, and spit after use. 1 Inhaler 0  . cholecalciferol (VITAMIN D) 1000 units tablet Take 1,000 Units by mouth daily.    . citalopram (CELEXA) 20 MG tablet Take 20 mg by mouth daily.      . Cyanocobalamin (VITAMIN B-12) 5000 MCG LOZG Take 5,000 mcg by mouth daily.    Marland Kitchen EPINEPHrine 0.3 mg/0.3 mL IJ SOAJ injection USE AS DIRECTED FOR LIFE THREATENING ALLERGIC REACTION    . fluticasone (FLONASE) 50 MCG/ACT nasal spray USE ONE SPRAY IN EACH NOSTRIL ONCE OR TWICE DAILY 16 g 5  . furosemide (LASIX) 20 MG tablet Take 20 mg by mouth 2 (  two) times daily.    Marland Kitchen levocetirizine (XYZAL) 5 MG tablet TAKE 1 TABLET BY MOUTH EVERYDAY AT BEDTIME    . liraglutide (VICTOZA) 18 MG/3ML SOPN Inject 0.6 mg into the skin daily.     Marland Kitchen loratadine (CLARITIN) 10 MG tablet Take 10 mg by mouth daily as needed for allergies.    . montelukast (SINGULAIR) 10 MG tablet Take 10 mg by mouth daily.    . Multiple Vitamins-Minerals (ICAPS AREDS 2 PO) Take 1 capsule by mouth 2 (two)  times daily.    Marland Kitchen omalizumab (XOLAIR) 150 MG injection Inject 150 mg into the skin every 28 (twenty-eight) days.     Marland Kitchen omeprazole (PRILOSEC) 40 MG capsule Take 1 capsule (40 mg total) by mouth 2 (two) times daily. 60 capsule 5  . pioglitazone (ACTOS) 30 MG tablet Take 30 mg by mouth daily.    . Probiotic Product (PROBIOTIC DAILY PO) Take 1 capsule by mouth at bedtime.     Marland Kitchen telmisartan (MICARDIS) 80 MG tablet Take 80 mg by mouth daily.    . Tetrahydrozoline HCl (VISINE OP) Place 1 drop into both eyes daily as needed (dry eyes).     Current Facility-Administered Medications on File Prior to Visit  Medication Dose Route Frequency Provider Last Rate Last Dose  . omalizumab Arvid Right) injection 150 mg  150 mg Subcutaneous Q28 days Jiles Prows, MD   150 mg at 06/05/19 1338   Allergies  Allergen Reactions  . Codeine Nausea And Vomiting  . Sulfa Antibiotics Other (See Comments)    Allergic per pt's mother     No results found for this or any previous visit (from the past 2160 hour(s)).  Objective: General: Patient is awake, alert, and oriented x 3 and in no acute distress.  Integument: Skin is warm, dry and supple bilateral. Nails are tender, long, thickened and  dystrophic with subungual debris, consistent with onychomycosis, 1-5 bilateral. No signs of infection. No open lesions or preulcerative lesions present bilateral. Remaining integument unremarkable.  Vasculature:  Dorsalis Pedis pulse 1/4 bilateral. Posterior Tibial pulse  0/4 bilateral.  Capillary fill time <3 sec 1-5 bilateral. Positive hair growth to the level of the digits. Temperature gradient within normal limits. No varicosities present bilateral.  Trace edema present bilateral.   Neurology: The patient has intact sensation measured with a 5.07/10g Semmes Weinstein Monofilament at all pedal sites bilateral. Vibratory sensation diminished bilateral with tuning fork. No Babinski sign present bilateral.   Musculoskeletal:  Asymptomatic pes planus pedal deformities noted bilateral. Muscular strength 5/5 in all lower extremity muscular groups bilateral without pain on range of motion . No tenderness with calf compression bilateral.  Assessment and Plan: Problem List Items Addressed This Visit    None    Visit Diagnoses    Pain due to onychomycosis of toenails of both feet    -  Primary   Diabetes mellitus type 2 with peripheral artery disease ([place])          -Examined patient. -Discussed and educated patient on diabetic foot care, especially with  regards to the vascular, neurological and musculoskeletal systems.  -Stressed the importance of good glycemic control and the detriment of not  controlling glucose levels in relation to the foot. -Mechanically debrided all nails 1-5 bilateral using sterile nail nipper and filed with dremel without incident  -Answered all patient questions -Patient to return  in 3 months for at risk foot care -Patient advised to call the office if any problems or questions  arise in the meantime.  Landis Martins, DPM

## 2019-06-25 DIAGNOSIS — R0600 Dyspnea, unspecified: Secondary | ICD-10-CM | POA: Diagnosis not present

## 2019-06-25 DIAGNOSIS — R600 Localized edema: Secondary | ICD-10-CM | POA: Diagnosis not present

## 2019-06-25 DIAGNOSIS — E119 Type 2 diabetes mellitus without complications: Secondary | ICD-10-CM | POA: Diagnosis not present

## 2019-06-25 DIAGNOSIS — R0689 Other abnormalities of breathing: Secondary | ICD-10-CM | POA: Diagnosis not present

## 2019-06-26 ENCOUNTER — Ambulatory Visit (INDEPENDENT_AMBULATORY_CARE_PROVIDER_SITE_OTHER): Payer: Medicare Other | Admitting: Pulmonary Disease

## 2019-06-26 ENCOUNTER — Encounter: Payer: Self-pay | Admitting: Pulmonary Disease

## 2019-06-26 ENCOUNTER — Other Ambulatory Visit: Payer: Self-pay

## 2019-06-26 DIAGNOSIS — J449 Chronic obstructive pulmonary disease, unspecified: Secondary | ICD-10-CM

## 2019-06-26 DIAGNOSIS — I272 Pulmonary hypertension, unspecified: Secondary | ICD-10-CM

## 2019-06-26 DIAGNOSIS — G4733 Obstructive sleep apnea (adult) (pediatric): Secondary | ICD-10-CM | POA: Diagnosis not present

## 2019-06-26 DIAGNOSIS — D869 Sarcoidosis, unspecified: Secondary | ICD-10-CM | POA: Diagnosis not present

## 2019-06-26 DIAGNOSIS — J4489 Other specified chronic obstructive pulmonary disease: Secondary | ICD-10-CM

## 2019-06-26 NOTE — Progress Notes (Signed)
[facility]  Chief Complaint  Patient presents with  . Follow-up    She reports she has been having increased Wheezing. Her PCP did a CXR and told her she had fluid on her lungs. Her PCP gave her 40mg  of Lasix to start taking. Currently taking prednisone 20mg , will complete taper over the weekend.      Constitutional:  There were no vitals taken for this visit.  Deferred  Past Medical History:  Allergies, Anxiety, Depression, Hiatal hernia, DM, GERD, Colon polyp, Nephrolithiasis, HLD, IBS, Sarcoidosis, HTN  Brief Summary:  [name] is a 73 y.o. female with obstructive sleep apnea, COPD with asthma, and sarcoidosis.  Virtual Visit via Telephone Note  I connected with [name] on 07/17/19 at 11:15 AM EDT by telephone and verified that I am speaking with the correct person using two identifiers.  Location: Patient: home Provider: medical office   I discussed the limitations, risks, security and privacy concerns of performing an evaluation and management service by telephone and the availability of in person appointments. I also discussed with the patient that there may be a patient responsible charge related to this service. The patient expressed understanding and agreed to proceed.  She has been getting more cough and wheeze.  Also getting more short of breath.  Has been getting leg swelling.  Taking increased dose of prednisone and taking lasix.    Not having fever, chest pain, or hemoptysis.   Physical Exam:  Deferred   Assessment/Plan:   Obstructive sleep apnea. - she is compliant with CPAP and reports benefit - continue auto CPAP  COPD with asthma. - she is followed by Dr. Neldon Mc for xolair - uses Qvar prn when symptoms get worse - continue albuterol, singulair, flonase, xyzal, singulair  History of pulmonary sarcoidosis with bronchiectasis. - she is on prednisone taper from PCP  Dyspnea on exertion with  report of interstitial edema and peripheral edema. - she has f/u with cardiology and PCP - advised her to arrange for repeat CXR with PCP  Pulmonary hypertension. - f/u with cardiology for repeat Echo   There are no Patient Instructions on file for this visit.  I discussed the assessment and treatment plan with the patient. The patient was provided an opportunity to ask questions and all were answered. The patient agreed with the plan and demonstrated an understanding of the instructions.   The patient was advised to call back or seek an in-person evaluation if the symptoms worsen or if the condition fails to improve as anticipated.  I provided 17 minutes of non-face-to-face time during this encounter.   Chesley Mires, MD [facility] Pager: [phone] 06/26/2019, 11:30 AM  Flow Sheet     Pulmonary tests:  Lymph node bx 04/27/01 >> non-caseating granuloma Spirometry 08/06/18 >> FEV1 0.79 (42%), FEV1% 65 HRCT chest 09/28/18 >> atherosclerosis, 6 mm nodule RML, 10 mm nodule LUL both stable since 2016; calcified granulomas, mild patchy air trapping, moderate varicoid BTX, mild subpleural reticulation PFT 11/02/18 >> FEV1 1.03 (53%), FEV1% 65, TLC 3.45 (76%), DLCO 60%, + BD  Sleep tests:  HST 09/27/18 >> AHI 32.4, SpO2 low 76%. Auto CPAP 05/06/19 to 06/04/19 >> used on 30 of 30 nights with average 6 hrs 27 min.  Average AHI 5.4 with median CPAP 9 and 95 th percentile CPAP 12 cm H2O  Cardiac tests:  Echo 01/12/18 >> EF 55 to 60%, grade 1 DD, mild AS/AR, mild MR, PAS 49  mmHg  Medications:   Allergies as of 06/26/2019      Reactions   Codeine Nausea And Vomiting   Sulfa Antibiotics Other (See Comments)   Allergic per pt's mother       Medication List       Accurate as of June 26, 2019 11:30 AM. If you have any questions, ask your nurse or doctor.        albuterol (2.5 MG/3ML) 0.083% nebulizer solution Commonly known as: PROVENTIL Take 3 mLs (2.5 mg total)  by nebulization every 4 (four) hours as needed for wheezing or shortness of breath.   albuterol 108 (90 Base) MCG/ACT inhaler Commonly known as: VENTOLIN HFA INHALE 2 PUFFS BY MOUTH 4 TIMES A DAY AS NEEDED   amLODipine 5 MG tablet Commonly known as: NORVASC Take 5 mg by mouth daily.   aspirin EC 81 MG tablet Take 81 mg by mouth daily.   atorvastatin 20 MG tablet Commonly known as: LIPITOR Take 20 mg by mouth every evening.   beclomethasone 80 MCG/ACT inhaler Commonly known as: Qvar RediHaler Inhale two doses twice daily during flare-up.  Rinse, gargle, and spit after use.   budesonide-formoterol 160-4.5 MCG/ACT inhaler Commonly known as: SYMBICORT Inhale 2 puffs into the lungs 2 (two) times daily.   cholecalciferol 1000 units tablet Commonly known as: VITAMIN D Take 1,000 Units by mouth daily.   citalopram 20 MG tablet Commonly known as: CELEXA Take 20 mg by mouth daily.   EPINEPHrine 0.3 mg/0.3 mL Soaj injection Commonly known as: EPI-PEN USE AS DIRECTED FOR LIFE THREATENING ALLERGIC REACTION   fluticasone 50 MCG/ACT nasal spray Commonly known as: FLONASE USE ONE SPRAY IN EACH NOSTRIL ONCE OR TWICE DAILY   furosemide 20 MG tablet Commonly known as: LASIX Take 20 mg by mouth 2 (two) times daily.   ICAPS AREDS 2 PO Take 1 capsule by mouth 2 (two) times daily.   levocetirizine 5 MG tablet Commonly known as: XYZAL TAKE 1 TABLET BY MOUTH EVERYDAY AT BEDTIME   loratadine 10 MG tablet Commonly known as: CLARITIN Take 10 mg by mouth daily as needed for allergies.   montelukast 10 MG tablet Commonly known as: SINGULAIR Take 10 mg by mouth daily.   omalizumab 150 MG injection Commonly known as: XOLAIR Inject 150 mg into the skin every 28 (twenty-eight) days.   omeprazole 40 MG capsule Commonly known as: PRILOSEC Take 1 capsule (40 mg total) by mouth 2 (two) times daily.   pioglitazone 30 MG tablet Commonly known as: ACTOS Take 30 mg by mouth daily.    PROBIOTIC DAILY PO Take 1 capsule by mouth at bedtime.   telmisartan 80 MG tablet Commonly known as: MICARDIS Take 80 mg by mouth daily.   Victoza 18 MG/3ML Sopn Generic drug: liraglutide Inject 0.6 mg into the skin daily.   VISINE OP Place 1 drop into both eyes daily as needed (dry eyes).   Vitamin B-12 5000 MCG Lozg Take 5,000 mcg by mouth daily.       Past Surgical History:  She  has a past surgical history that includes Partial hysterectomy; Tonsillectomy and adenoidectomy; Nasal septum surgery; Tubal ligation; Hemorrhoid surgery; Bladder surgery; Cataract extraction (Bilateral); Trigger finger release (Left, 05/12/2016); Hardware Removal (Left, 01/12/2017); Reverse shoulder arthroplasty (Left, 02/22/2018); and Reverse shoulder arthroplasty (Left, 02/22/2018).  Family History:  Her family history includes Diabetes in her maternal grandmother and mother; Esophageal cancer in her father.  Social History:  She  reports that she has never smoked.  She has never used smokeless tobacco. She reports that she does not drink alcohol or use drugs.

## 2019-06-28 ENCOUNTER — Other Ambulatory Visit (HOSPITAL_BASED_OUTPATIENT_CLINIC_OR_DEPARTMENT_OTHER): Payer: Medicare Other

## 2019-06-29 ENCOUNTER — Other Ambulatory Visit: Payer: Self-pay | Admitting: Allergy and Immunology

## 2019-07-01 ENCOUNTER — Telehealth: Payer: Self-pay | Admitting: Pulmonary Disease

## 2019-07-01 NOTE — Telephone Encounter (Signed)
Returned call to [employer] at [employer]. She spoke with patient [name] regarding the echo which is to be performed tomorrow 07/02/2019.  Patient reported she wanted their office to call this office to see if patient had follow up with Dr. Halford Chessman after the echo was completed.  No notes in [facility] referencing a need for follow up but advised we are always available to discuss findings with patient if they have concerns or want Dr. Juanetta Gosling review.  Suggested the ordering provider refer patient back to [place] or forward information regarding echo to Dr. Halford Chessman for review and recommendations if further discuss with the patient is needed.    Mardene Celeste states the patient wasn't clear on whether or not she needed to see Dr. Halford Chessman following the echo and asked if we would call the patient.   Patient was also contacted and offered appointment following the echo and explained that though Dr.Sood did not set up a follow up appointment, he did note that he was aware the echo was planned and mentioned comparing results. Discussed with the patient that the ordering provider would review the echo findings with her and then they could refer her back to [place] and discuss findings with Dr. Halford Chessman, if desired.    Reminded patient that if she wants appointment with Dr. Halford Chessman we are happy to schedule one for her.  Patient will wait to complete echo and receive findings from ordering provider and then if she wishes to discuss with Dr. Halford Chessman she will call us to set up appointment.  Nothing further needed.

## 2019-07-02 ENCOUNTER — Ambulatory Visit (HOSPITAL_BASED_OUTPATIENT_CLINIC_OR_DEPARTMENT_OTHER)
Admission: RE | Admit: 2019-07-02 | Discharge: 2019-07-02 | Disposition: A | Discharge status: Home / Self Care | Payer: Medicare Other | Source: Ambulatory Visit | Attending: Cardiology | Admitting: Cardiology

## 2019-07-02 ENCOUNTER — Other Ambulatory Visit: Payer: Self-pay

## 2019-07-02 DIAGNOSIS — R0609 Other forms of dyspnea: Secondary | ICD-10-CM | POA: Diagnosis not present

## 2019-07-02 DIAGNOSIS — Z862 Personal history of diseases of the blood and blood-forming organs and certain disorders involving the immune mechanism: Secondary | ICD-10-CM | POA: Diagnosis not present

## 2019-07-02 DIAGNOSIS — J45901 Unspecified asthma with (acute) exacerbation: Secondary | ICD-10-CM | POA: Diagnosis not present

## 2019-07-02 DIAGNOSIS — J4550 Severe persistent asthma, uncomplicated: Secondary | ICD-10-CM | POA: Diagnosis not present

## 2019-07-02 DIAGNOSIS — J3089 Other allergic rhinitis: Secondary | ICD-10-CM | POA: Diagnosis not present

## 2019-07-02 DIAGNOSIS — K219 Gastro-esophageal reflux disease without esophagitis: Secondary | ICD-10-CM | POA: Diagnosis not present

## 2019-07-02 DIAGNOSIS — J441 Chronic obstructive pulmonary disease with (acute) exacerbation: Secondary | ICD-10-CM | POA: Diagnosis not present

## 2019-07-02 NOTE — Progress Notes (Signed)
  Echocardiogram 2D Echocardiogram has been performed.  [name] 07/02/2019, 1:45 PM

## 2019-07-03 ENCOUNTER — Ambulatory Visit: Payer: Medicare Other

## 2019-07-03 ENCOUNTER — Encounter: Payer: Self-pay | Admitting: Allergy and Immunology

## 2019-07-03 ENCOUNTER — Ambulatory Visit (INDEPENDENT_AMBULATORY_CARE_PROVIDER_SITE_OTHER): Payer: Medicare Other | Admitting: Allergy and Immunology

## 2019-07-03 VITALS — BP 148/68 | HR 56 | Temp 98.0°F | Resp 20

## 2019-07-03 DIAGNOSIS — J441 Chronic obstructive pulmonary disease with (acute) exacerbation: Secondary | ICD-10-CM

## 2019-07-03 DIAGNOSIS — J3089 Other allergic rhinitis: Secondary | ICD-10-CM | POA: Diagnosis not present

## 2019-07-03 DIAGNOSIS — J4550 Severe persistent asthma, uncomplicated: Secondary | ICD-10-CM | POA: Diagnosis not present

## 2019-07-03 DIAGNOSIS — J45901 Unspecified asthma with (acute) exacerbation: Secondary | ICD-10-CM

## 2019-07-03 DIAGNOSIS — K219 Gastro-esophageal reflux disease without esophagitis: Secondary | ICD-10-CM

## 2019-07-03 DIAGNOSIS — Z862 Personal history of diseases of the blood and blood-forming organs and certain disorders involving the immune mechanism: Secondary | ICD-10-CM

## 2019-07-03 NOTE — Patient Instructions (Addendum)
  1. Continue Xolair and Epi-Pen  2. Continue Symbicort 160 -2 inhalations 2 times per day with spacer  3. Add SAMPLES of Qvar 80 REDIHALER two inhalations two times per day to Symbicort during "flare up".  4. Continue Flonase - one spray each nostril 1-2 times per day  5. Continue omeprazole 40 twice a day + Famotidine 40mg  in evening  6. Continue nasal saline, ProAir HFA, Duoneb if needed.  7. Can add OTC Loratadine 10 mg one tablet 1-2 times per day if needed  8. Can add OTC Mucinex DM 1-2 tablet 1-2 times per day if needed  9. Can add ipratropium 0.06% nasal spray - 1-2 sprays each nostril every 6 hours if needed  10. For this recent episode start prednisone 10 mg - 1 tablet 1 time per day for 10 days  11. Review chest x-ray from [facility].  12. Return to clinic in December 2020 or earlier if problem  13. Obtain fall flu vaccine (and COVID vaccine)

## 2019-07-03 NOTE — Progress Notes (Signed)
[place] [facility] [place] [facility] [place]   Follow-up Note  Referring Provider: [facility], [place] Primary Provider: [facility], [place] Family Date of Office Visit: 07/03/2019  Subjective:   [name] (DOB: 1946-11-20) is a 73 y.o. female who returns to the [facility] [place] on 07/03/2019 in re-evaluation of the following:  HPI: [name] returns to this clinic in evaluation of COPD with component of asthma, history of pulmonary sarcoidosis, allergic rhinitis and LPR.  I have not seen her in this clinic since 11 Apr 2019.  She was doing very well since that last visit and continued to do quite well without any significant upper or lower respiratory tract symptoms and good control of her reflux.  She did not require systemic steroid or an antibiotic for any type of airway issue and her requirement for short acting bronchodilator was less than 1 time per week.  However, about 2 weeks ago she developed wheezing and coughing and yellow mucus production for which she had evaluation at [facility] family practice and was treated with prednisone which she finished 4 days ago.  She is somewhat better at this point in time although she still continues to have some wheezing and some slight cough.  Her mucus production has been eliminated.  She never did have any fever with this issue or any significant upper airway symptoms and once again her reflux did not appear to become active during this event.  Apparently she had a chest x-ray which may have identified a "left lung issue".    As well, she is scheduled to see Dr. Geraldo Pitter, cardiology, and she has had a recent echocardiogram performed.  She sees Dr. Halford Chessman for sleep apnea, history of sarcoidosis, and pulmonary hypertension.  Allergies as of 07/03/2019      Reactions   Codeine Nausea And Vomiting   Sulfa Antibiotics Other (See Comments)   Allergic per pt's mother       Medication List      albuterol (2.5  MG/3ML) 0.083% nebulizer solution Commonly known as: PROVENTIL Take 3 mLs (2.5 mg total) by nebulization every 4 (four) hours as needed for wheezing or shortness of breath.   albuterol 108 (90 Base) MCG/ACT inhaler Commonly known as: VENTOLIN HFA INHALE 2 PUFFS BY MOUTH 4 TIMES A DAY AS NEEDED   amLODipine 5 MG tablet Commonly known as: NORVASC Take 5 mg by mouth daily.   aspirin EC 81 MG tablet Take 81 mg by mouth daily.   atorvastatin 20 MG tablet Commonly known as: LIPITOR Take 20 mg by mouth every evening.   beclomethasone 80 MCG/ACT inhaler Commonly known as: Qvar RediHaler Inhale two doses twice daily during flare-up.  Rinse, gargle, and spit after use.   budesonide-formoterol 160-4.5 MCG/ACT inhaler Commonly known as: SYMBICORT Inhale 2 puffs into the lungs 2 (two) times daily.   cholecalciferol 1000 units tablet Commonly known as: VITAMIN D Take 1,000 Units by mouth daily.   citalopram 20 MG tablet Commonly known as: CELEXA Take 20 mg by mouth daily.   EPINEPHrine 0.3 mg/0.3 mL Soaj injection Commonly known as: EPI-PEN USE AS DIRECTED FOR LIFE THREATENING ALLERGIC REACTION   famotidine 40 MG tablet Commonly known as: PEPCID   fluticasone 50 MCG/ACT nasal spray Commonly known as: FLONASE USE ONE SPRAY IN EACH NOSTRIL ONCE OR TWICE DAILY   furosemide 20 MG tablet Commonly known as: LASIX Take 20 mg by mouth 2 (two) times daily.   ICAPS AREDS  2 PO Take 1 capsule by mouth 2 (two) times daily.   levocetirizine 5 MG tablet Commonly known as: XYZAL TAKE 1 TABLET BY MOUTH EVERYDAY AT BEDTIME   loratadine 10 MG tablet Commonly known as: CLARITIN Take 10 mg by mouth daily as needed for allergies.   montelukast 10 MG tablet Commonly known as: SINGULAIR Take 10 mg by mouth daily.   omalizumab 150 MG injection Commonly known as: XOLAIR Inject 150 mg into the skin every 28 (twenty-eight) days.   omeprazole 40 MG capsule Commonly known as: PRILOSEC TAKE  1 CAPSULE BY MOUTH TWICE A DAY   pioglitazone 30 MG tablet Commonly known as: ACTOS Take 30 mg by mouth daily.   predniSONE 20 MG tablet Commonly known as: DELTASONE TAKE 2 TABS TONIGHT, THEN 3 TABS EVERY AM X4 DAYS, THEN 2 TABS X4 DAYS, THEN 1 TAB X4 DAYS WITH FOOD   PROBIOTIC DAILY PO Take 1 capsule by mouth at bedtime.   telmisartan 80 MG tablet Commonly known as: MICARDIS Take 80 mg by mouth daily.   Victoza 18 MG/3ML Sopn Generic drug: liraglutide Inject 0.6 mg into the skin daily.   VISINE OP Place 1 drop into both eyes daily as needed (dry eyes).   Vitamin B-12 5000 MCG Lozg Take 5,000 mcg by mouth daily.       Past Medical History:  Diagnosis Date  . Allergy   . Anxiety   . Arthritis   . Asthma   . Cataract    bilateral and removed  . Depression   . Diaphragmatic hernia without mention of obstruction or gangrene   . DM (diabetes mellitus) ([place])   . Dyspnea   . Esophageal reflux   . Esophagitis, unspecified   . H/O adenomatous polyp of colon 2011  . Heart murmur    no cardiologist, family Dr. Maryfrances Bunnell  . History of kidney stones   . Hyperlipidemia   . Incontinence of feces   . Irritable bowel syndrome   . Kidney stones   . OSA (obstructive sleep apnea) 09/28/2018  . Sarcoidosis   . Sleep apnea    no cpap now  . Status post dilation of esophageal narrowing   . Unspecified asthma(493.90)   . Unspecified essential hypertension   . Unspecified hearing loss     Past Surgical History:  Procedure Laterality Date  . BLADDER SURGERY    . CATARACT EXTRACTION Bilateral   . HARDWARE REMOVAL Left 01/12/2017   Procedure: HARDWARE REMOVAL left wrist;  Surgeon: Leanora Cover, MD;  Location: [place];  Service: Orthopedics;  Laterality: Left;  . HEMORRHOID SURGERY    . NASAL SEPTUM SURGERY    . PARTIAL HYSTERECTOMY    . REVERSE SHOULDER ARTHROPLASTY Left 02/22/2018  . REVERSE SHOULDER ARTHROPLASTY Left 02/22/2018   Procedure: LEFT REVERSE  SHOULDER ARTHROPLASTY;  Surgeon: Meredith Pel, MD;  Location: [place];  Service: Orthopedics;  Laterality: Left;  . TONSILLECTOMY AND ADENOIDECTOMY    . TRIGGER FINGER RELEASE Left 05/12/2016   Procedure: RELEASE TRIGGER FINGER/A-1 PULLEY INFECTION LEFT RING TENDON SHEATH INJECT RIGHT INDEX FINGER;  Surgeon: Leanora Cover, MD;  Location: [facility];  Service: Orthopedics;  Laterality: Left;  . TUBAL LIGATION      Review of systems negative except as noted in HPI / PMHx or noted below:  Review of Systems  Constitutional: Negative.   HENT: Negative.   Eyes: Negative.   Respiratory: Negative.   Cardiovascular: Negative.   Gastrointestinal: Negative.  Genitourinary: Negative.   Musculoskeletal: Negative.   Skin: Negative.   Neurological: Negative.   Endo/Heme/Allergies: Negative.   Psychiatric/Behavioral: Negative.      Objective:   Vitals:   07/03/19 1056  BP: (!) 148/68  Pulse: (!) 56  Resp: 20  Temp: 98 F (36.7 C)  SpO2: 98%          Physical Exam Constitutional:      Appearance: She is not diaphoretic.  HENT:     Head: Normocephalic.     Right Ear: Tympanic membrane, ear canal and external ear normal.     Left Ear: Tympanic membrane, ear canal and external ear normal.     Nose: Nose normal. No mucosal edema or rhinorrhea.     Mouth/Throat:     Pharynx: Uvula midline. No oropharyngeal exudate.  Eyes:     Conjunctiva/sclera: Conjunctivae normal.  Neck:     Thyroid: No thyromegaly.     Trachea: Trachea normal. No tracheal tenderness or tracheal deviation.  Cardiovascular:     Rate and Rhythm: Normal rate and regular rhythm.     Heart sounds: S1 normal and S2 normal. Murmur (Systolic ) present.  Pulmonary:     Effort: No respiratory distress.     Breath sounds: No stridor. Wheezing (Bilateral expiratory wheezes all lung fields) present. No rales.  Lymphadenopathy:     Head:     Right side of head: No tonsillar adenopathy.     Left side of  head: No tonsillar adenopathy.     Cervical: No cervical adenopathy.  Skin:    Findings: No erythema or rash.     Nails: There is no clubbing.   Neurological:     Mental Status: She is alert.     Diagnostics:    Spirometry was performed and demonstrated an FEV1 of 0.64 at 52 % of predicted.  Results of an echocardiogram obtained for August 2020 identified the following:  1. The left ventricle has normal systolic function with an ejection fraction of 60-65%. The cavity size was normal. Left ventricular diastolic Doppler parameters are consistent with pseudonormalization.  2. The right ventricle has normal systolic function. The cavity was normal. There is no increase in right ventricular wall thickness.  3. The mitral valve is degenerative. Mild thickening of the mitral valve leaflet. Mild calcification of the mitral valve leaflet. Mitral valve regurgitation is mild to moderate by color flow Doppler.  4. The tricuspid valve is grossly normal.  5. The aortic valve is abnormal. Mild calcification of the aortic valve. Aortic valve regurgitation is mild by color flow Doppler. Mild stenosis of the aortic valve.  6. The aorta is normal in size and structure.  7. The inferior vena cava was dilated in size with >50% respiratory variability.  Assessment and Plan:   1. Asthma with COPD with exacerbation ([place])   2. History of sarcoidosis   3. Other allergic rhinitis   4. LPRD (laryngopharyngeal reflux disease)   5. Severe persistent asthma without complication     1. Continue Xolair and Epi-Pen  2. Continue Symbicort 160 -2 inhalations 2 times per day with spacer  3. Add SAMPLES of Qvar 80 REDIHALER two inhalations two times per day to Symbicort during "flare up".  4. Continue Flonase - one spray each nostril 1-2 times per day  5. Continue omeprazole 40 twice a day + Famotidine 40mg  in evening  6. Continue nasal saline, ProAir HFA, Duoneb if needed.  7. Can add OTC Loratadine 10 mg  one tablet 1-2 times per day if needed  8. Can add OTC Mucinex DM 1-2 tablet 1-2 times per day if needed  9. Can add ipratropium 0.06% nasal spray - 1-2 sprays each nostril every 6 hours if needed  10. For this recent episode start prednisone 10 mg - 1 tablet 1 time per day for 10 days  11. Review [facility] from [facility].  12. Return to clinic in December 2020 or earlier if problem  13. Obtain fall flu vaccine (and COVID vaccine)  Mikele appears to have developed some type of viral induced respiratory tract flare which appears to have improved somewhat over the course of the past several weeks.  She still has some lingering inflammation and we will give her a low dose of systemic steroids using prednisone at 10 mg once a day for the next 10 days when she continues on a very large collection of anti-inflammatory agents for her airway including the use of omalizumab and also continues to address the issue with reflux.  I will see her back in this clinic in December 2020 or earlier if there is a problem.  Allena Katz, MD Allergy / Immunology [place]

## 2019-07-04 ENCOUNTER — Encounter: Payer: Self-pay | Admitting: Allergy and Immunology

## 2019-07-05 ENCOUNTER — Encounter: Payer: Self-pay | Admitting: Cardiology

## 2019-07-05 ENCOUNTER — Ambulatory Visit (INDEPENDENT_AMBULATORY_CARE_PROVIDER_SITE_OTHER): Payer: Medicare Other | Admitting: Cardiology

## 2019-07-05 ENCOUNTER — Other Ambulatory Visit: Payer: Self-pay

## 2019-07-05 VITALS — BP 142/60 | HR 59 | Ht 60.0 in | Wt 171.4 lb

## 2019-07-05 DIAGNOSIS — I10 Essential (primary) hypertension: Secondary | ICD-10-CM | POA: Diagnosis not present

## 2019-07-05 DIAGNOSIS — E119 Type 2 diabetes mellitus without complications: Secondary | ICD-10-CM

## 2019-07-05 DIAGNOSIS — E088 Diabetes mellitus due to underlying condition with unspecified complications: Secondary | ICD-10-CM | POA: Diagnosis not present

## 2019-07-05 DIAGNOSIS — I340 Nonrheumatic mitral (valve) insufficiency: Secondary | ICD-10-CM

## 2019-07-05 DIAGNOSIS — G4733 Obstructive sleep apnea (adult) (pediatric): Secondary | ICD-10-CM

## 2019-07-05 DIAGNOSIS — I350 Nonrheumatic aortic (valve) stenosis: Secondary | ICD-10-CM

## 2019-07-05 HISTORY — DX: Nonrheumatic mitral (valve) insufficiency: I34.0

## 2019-07-05 HISTORY — DX: Type 2 diabetes mellitus without complications: E11.9

## 2019-07-05 HISTORY — DX: Nonrheumatic aortic (valve) stenosis: I35.0

## 2019-07-05 NOTE — Patient Instructions (Addendum)
Medication Instructions:  Your physician recommends that you continue on your current medications as directed. Please refer to the Current Medication list given to you today.  If you need a refill on your cardiac medications before your next appointment, please call your pharmacy.   Lab work: NONE If you have labs (blood work) drawn today and your tests are completely normal, you will receive your results only by: . MyChart Message (if you have MyChart) OR . A paper copy in the mail If you have any lab test that is abnormal or we need to change your treatment, we will call you to review the results.  Testing/Procedures: You had an EKG performed today  Follow-Up: At [facility], you and your health needs are our priority.  As part of our continuing mission to provide you with exceptional heart care, we have created designated Provider Care Teams.  These Care Teams include your primary Cardiologist (physician) and Advanced Practice Providers (APPs -  Physician Assistants and Nurse Practitioners) who all work together to provide you with the care you need, when you need it. You will need a follow up appointment in 6 months.   

## 2019-07-05 NOTE — Progress Notes (Signed)
Cardiology Office Note:    Date:  07/05/2019   ID:  [name], DOB June 13, 1946, MRN [id number]  PCP:  [facility], [place]  Cardiologist:  Jenean Lindau, MD   Referring MD: Phys[facility], [place]:    1. Essential hypertension   2. OSA (obstructive sleep apnea)   3. Diabetes mellitus due to underlying condition with unspecified complications ([place])   4. Mild aortic stenosis   5. Mitral valve insufficiency, unspecified etiology    PLAN:    In order of problems listed above:  1. Primary prevention stressed with the patient.  Importance of compliance with diet and medication stressed and she vocalized understanding. 2. Essential hypertension: Blood pressure stable 3. Mild aortic stenosis: Medical management.  Discussed with patient echocardiographic findings at length 4. Mild to moderate mitral regurgitation: Optimize blood pressure.  Regular exercise was discussed and she is agreeable. 5. Patient will be seen in follow-up appointment in 6 months or earlier if the patient has any concerns 6.    Medication Adjustments/Labs and Tests Ordered: Current medicines are reviewed at length with the patient today.  Concerns regarding medicines are outlined above.  Orders Placed This Encounter  Procedures  . EKG 12-Lead   No orders of the defined types were placed in this encounter.    No chief complaint on file.    History of Present Illness:    [name] is a 73 y.o. female.  Patient was evaluated by me.  She has history of essential hypertension and mixed dyslipidemia.  She denies any problems at this time and takes care of activities of daily living.  No chest pain orthopnea or PND.  She got late to the appointment because of traffic and her blood pressure is elevated.  At the time of my evaluation, the patient is alert awake oriented and in no distress.  She is walking some on a regular basis.  Past Medical History:  Diagnosis Date  .  Allergy   . Anxiety   . Arthritis   . Asthma   . Cataract    bilateral and removed  . Depression   . Diaphragmatic hernia without mention of obstruction or gangrene   . DM (diabetes mellitus) ([place])   . Dyspnea   . Esophageal reflux   . Esophagitis, unspecified   . H/O adenomatous polyp of colon 2011  . Heart murmur    no cardiologist, family Dr. Maryfrances Bunnell  . History of kidney stones   . Hyperlipidemia   . Incontinence of feces   . Irritable bowel syndrome   . Kidney stones   . OSA (obstructive sleep apnea) 09/28/2018  . Sarcoidosis   . Sleep apnea    no cpap now  . Status post dilation of esophageal narrowing   . Unspecified asthma(493.90)   . Unspecified essential hypertension   . Unspecified hearing loss     Past Surgical History:  Procedure Laterality Date  . BLADDER SURGERY    . CATARACT EXTRACTION Bilateral   . HARDWARE REMOVAL Left 01/12/2017   Procedure: HARDWARE REMOVAL left wrist;  Surgeon: Leanora Cover, MD;  Location: [place];  Service: Orthopedics;  Laterality: Left;  . HEMORRHOID SURGERY    . NASAL SEPTUM SURGERY    . PARTIAL HYSTERECTOMY    . REVERSE SHOULDER ARTHROPLASTY Left 02/22/2018  . REVERSE SHOULDER ARTHROPLASTY Left 02/22/2018   Procedure: LEFT REVERSE SHOULDER ARTHROPLASTY;  Surgeon: Meredith Pel, MD;  Location: [place];  Service: Orthopedics;  Laterality: Left;  . TONSILLECTOMY AND ADENOIDECTOMY    . TRIGGER FINGER RELEASE Left 05/12/2016   Procedure: RELEASE TRIGGER FINGER/A-1 PULLEY INFECTION LEFT RING TENDON SHEATH INJECT RIGHT INDEX FINGER;  Surgeon: Leanora Cover, MD;  Location: [place];  Service: Orthopedics;  Laterality: Left;  . TUBAL LIGATION      Current Medications: Current Meds  Medication Sig  . albuterol (PROVENTIL HFA;VENTOLIN HFA) 108 (90 Base) MCG/ACT inhaler INHALE 2 PUFFS BY MOUTH 4 TIMES A DAY AS NEEDED  . albuterol (PROVENTIL) (2.5 MG/3ML) 0.083% nebulizer solution Take 3 mLs (2.5 mg  total) by nebulization every 4 (four) hours as needed for wheezing or shortness of breath.  Marland Kitchen amLODipine (NORVASC) 5 MG tablet Take 5 mg by mouth daily.  Marland Kitchen aspirin EC 81 MG tablet Take 81 mg by mouth daily.  Marland Kitchen atorvastatin (LIPITOR) 20 MG tablet Take 20 mg by mouth every evening.   . beclomethasone (QVAR REDIHALER) 80 MCG/ACT inhaler Inhale two doses twice daily during flare-up.  Rinse, gargle, and spit after use.  . budesonide-formoterol (SYMBICORT) 160-4.5 MCG/ACT inhaler Inhale 2 puffs into the lungs 2 (two) times daily.  . cholecalciferol (VITAMIN D) 1000 units tablet Take 1,000 Units by mouth daily.  . citalopram (CELEXA) 20 MG tablet Take 20 mg by mouth daily.    . Cyanocobalamin (VITAMIN B-12) 5000 MCG LOZG Take 5,000 mcg by mouth daily.  Marland Kitchen EPINEPHrine 0.3 mg/0.3 mL IJ SOAJ injection USE AS DIRECTED FOR LIFE THREATENING ALLERGIC REACTION  . famotidine (PEPCID) 40 MG tablet   . fluticasone (FLONASE) 50 MCG/ACT nasal spray USE ONE SPRAY IN EACH NOSTRIL ONCE OR TWICE DAILY  . furosemide (LASIX) 20 MG tablet Take 20 mg by mouth 2 (two) times daily.  Marland Kitchen levocetirizine (XYZAL) 5 MG tablet TAKE 1 TABLET BY MOUTH EVERYDAY AT BEDTIME  . liraglutide (VICTOZA) 18 MG/3ML SOPN Inject 0.6 mg into the skin daily.   . montelukast (SINGULAIR) 10 MG tablet Take 10 mg by mouth daily.  Marland Kitchen omalizumab (XOLAIR) 150 MG injection Inject 150 mg into the skin every 28 (twenty-eight) days.   Marland Kitchen omeprazole (PRILOSEC) 40 MG capsule TAKE 1 CAPSULE BY MOUTH TWICE A DAY  . pioglitazone (ACTOS) 30 MG tablet Take 30 mg by mouth daily.  . Probiotic Product (PROBIOTIC DAILY PO) Take 1 capsule by mouth at bedtime.   Marland Kitchen telmisartan (MICARDIS) 80 MG tablet Take 80 mg by mouth daily.  . Tetrahydrozoline HCl (VISINE OP) Place 1 drop into both eyes daily as needed (dry eyes).   Current Facility-Administered Medications for the 07/05/19 encounter (Office Visit) with Sergi Gellner, Reita Cliche, MD  Medication  . omalizumab Arvid Right) injection  150 mg     Allergies:   Codeine and Sulfa antibiotics   Social History   Socioeconomic History  . Marital status: Married    Spouse name: Not on file  . Number of children: 2  . Years of education: Not on file  . Highest education level: Not on file  Occupational History  . Occupation: retired  Scientific laboratory technician  . Financial resource strain: Not on file  . Food insecurity    Worry: Not on file    Inability: Not on file  . Transportation needs    Medical: Not on file    Non-medical: Not on file  Tobacco Use  . Smoking status: Never Smoker  . Smokeless tobacco: Never Used  Substance and Sexual Activity  . Alcohol use: No  . Drug use: No  .  Sexual activity: Not on file  Lifestyle  . Physical activity    Days per week: Not on file    Minutes per session: Not on file  . Stress: Not on file  Relationships  . Social Herbalist on phone: Not on file    Gets together: Not on file    Attends religious service: Not on file    Active member of club or organization: Not on file    Attends meetings of clubs or organizations: Not on file    Relationship status: Not on file  Other Topics Concern  . Not on file  Social History Narrative   Daily caffeine use.      Family History: The patient's family history includes Diabetes in her maternal grandmother and mother; Esophageal cancer in her father. There is no history of Colon cancer, Rectal cancer, or Stomach cancer.  ROS:   Please see the history of present illness.    All other systems reviewed and are negative.  EKGs/Labs/Other Studies Reviewed:    The following studies were reviewed today: IMPRESSIONS    1. The left ventricle has normal systolic function with an ejection fraction of 60-65%. The cavity size was normal. Left ventricular diastolic Doppler parameters are consistent with pseudonormalization.  2. The right ventricle has normal systolic function. The cavity was normal. There is no increase in right  ventricular wall thickness.  3. The mitral valve is degenerative. Mild thickening of the mitral valve leaflet. Mild calcification of the mitral valve leaflet. Mitral valve regurgitation is mild to moderate by color flow Doppler.  4. The tricuspid valve is grossly normal.  5. The aortic valve is abnormal. Mild calcification of the aortic valve. Aortic valve regurgitation is mild by color flow Doppler. Mild stenosis of the aortic valve.  6. The aorta is normal in size and structure.  7. The inferior vena cava was dilated in size with >50% respiratory variability.    Recent Labs: 01/16/2019: Hemoglobin 9.9; Platelets 298  Recent Lipid Panel No results found for: CHOL, TRIG, HDL, CHOLHDL, VLDL, LDLCALC, LDLDIRECT  Physical Exam:    VS:  BP (!) 142/60   Pulse (!) 59   Ht 5' (1.524 m)   Wt 171 lb 6.4 oz (77.7 kg)   SpO2 97%   BMI 33.47 kg/m     Wt Readings from Last 3 Encounters:  07/05/19 171 lb 6.4 oz (77.7 kg)  06/06/19 172 lb 9.6 oz (78.3 kg)  05/06/19 165 lb (74.8 kg)     GEN: Patient is in no acute distress HEENT: Normal NECK: No JVD; No carotid bruits LYMPHATICS: No lymphadenopathy CARDIAC: Hear sounds regular, 2/6 systolic murmur at the apex. RESPIRATORY:  Clear to auscultation without rales, wheezing or rhonchi  ABDOMEN: Soft, non-tender, non-distended MUSCULOSKELETAL:  No edema; No deformity  SKIN: Warm and dry NEUROLOGIC:  Alert and oriented x 3 PSYCHIATRIC:  Normal affect   Signed, Jenean Lindau, MD  07/05/2019 3:45 PM    [facility]

## 2019-07-08 ENCOUNTER — Ambulatory Visit: Payer: Medicare Other | Admitting: Allergy and Immunology

## 2019-07-15 DIAGNOSIS — H52203 Unspecified astigmatism, bilateral: Secondary | ICD-10-CM | POA: Diagnosis not present

## 2019-07-15 DIAGNOSIS — H353131 Nonexudative age-related macular degeneration, bilateral, early dry stage: Secondary | ICD-10-CM | POA: Diagnosis not present

## 2019-07-15 DIAGNOSIS — E119 Type 2 diabetes mellitus without complications: Secondary | ICD-10-CM | POA: Diagnosis not present

## 2019-07-15 DIAGNOSIS — H43813 Vitreous degeneration, bilateral: Secondary | ICD-10-CM | POA: Diagnosis not present

## 2019-07-17 NOTE — Patient Instructions (Signed)
Follow up in 6 months 

## 2019-07-23 DIAGNOSIS — E119 Type 2 diabetes mellitus without complications: Secondary | ICD-10-CM | POA: Diagnosis not present

## 2019-07-23 DIAGNOSIS — I10 Essential (primary) hypertension: Secondary | ICD-10-CM | POA: Diagnosis not present

## 2019-07-23 DIAGNOSIS — D869 Sarcoidosis, unspecified: Secondary | ICD-10-CM | POA: Diagnosis not present

## 2019-07-23 DIAGNOSIS — J4540 Moderate persistent asthma, uncomplicated: Secondary | ICD-10-CM | POA: Diagnosis not present

## 2019-07-29 DIAGNOSIS — I10 Essential (primary) hypertension: Secondary | ICD-10-CM | POA: Diagnosis not present

## 2019-07-29 DIAGNOSIS — E119 Type 2 diabetes mellitus without complications: Secondary | ICD-10-CM | POA: Diagnosis not present

## 2019-07-30 DIAGNOSIS — J4550 Severe persistent asthma, uncomplicated: Secondary | ICD-10-CM | POA: Diagnosis not present

## 2019-07-31 ENCOUNTER — Ambulatory Visit (INDEPENDENT_AMBULATORY_CARE_PROVIDER_SITE_OTHER): Payer: Medicare Other | Admitting: *Deleted

## 2019-07-31 ENCOUNTER — Other Ambulatory Visit: Payer: Self-pay

## 2019-07-31 DIAGNOSIS — J4550 Severe persistent asthma, uncomplicated: Secondary | ICD-10-CM | POA: Diagnosis not present

## 2019-08-06 DIAGNOSIS — E119 Type 2 diabetes mellitus without complications: Secondary | ICD-10-CM | POA: Diagnosis not present

## 2019-08-12 IMAGING — CT CT SHOULDER*L* W/O CM
1 of 2 series · 9 of 14 positions shown, 12 images · non-contrast
Comparison: None.

CLINICAL DATA: Left shoulder pain.  Preop planning.

EXAM:
CT OF THE UPPER LEFT EXTREMITY WITHOUT CONTRAST
TECHNIQUE: Multidetector CT imaging of the upper left extremity was performed
according to the standard protocol.

[Series 5: thin soft · axial · 0.49mm/px · z∈[-578,-451]mm · 9 of 265 slices shown, 12 images]
[im 27/265  soft-tissue]
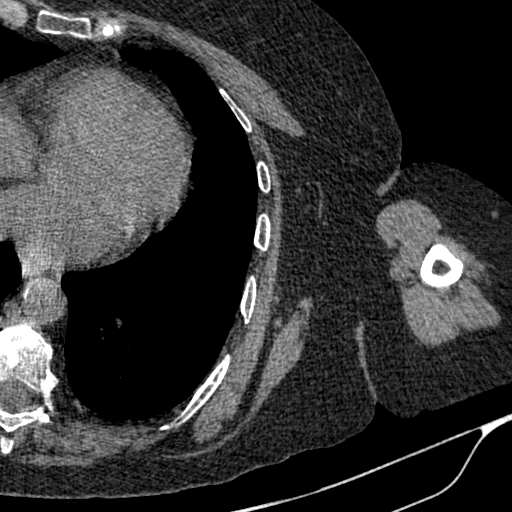
[im 27/265  bone]
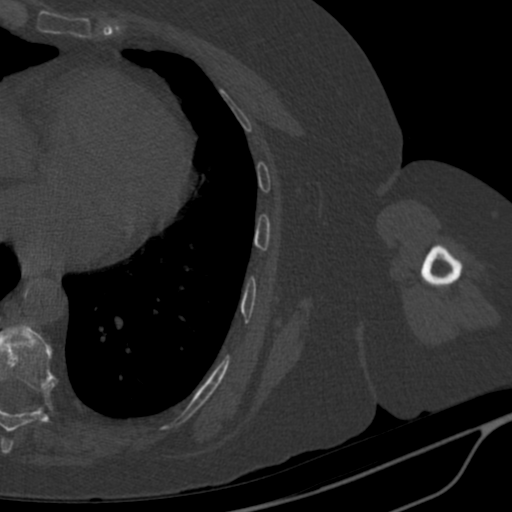
[im 53/265  bone]
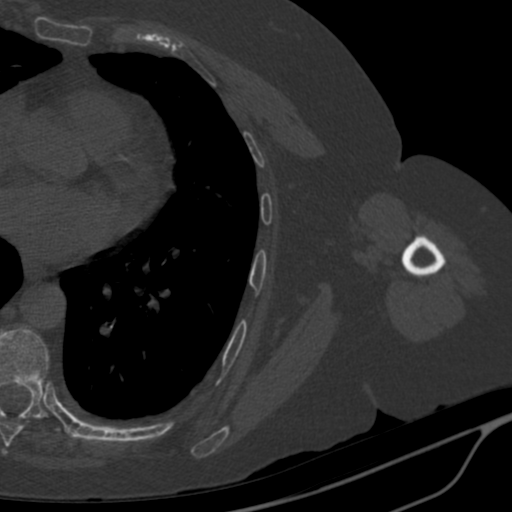
[im 80/265  bone]
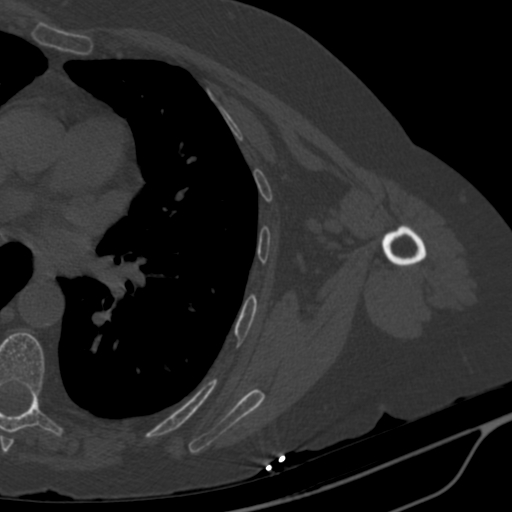
[im 106/265  bone]
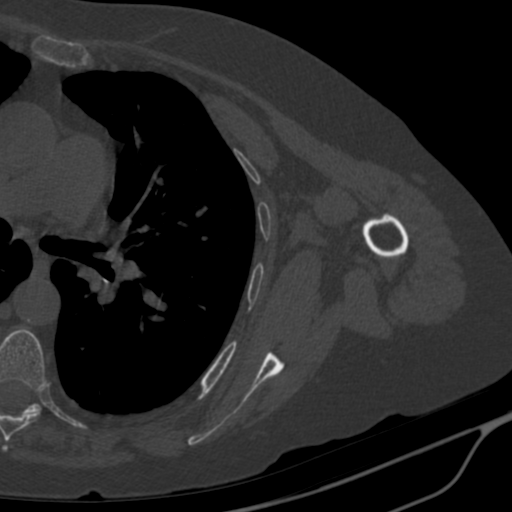
[im 133/265  soft-tissue]
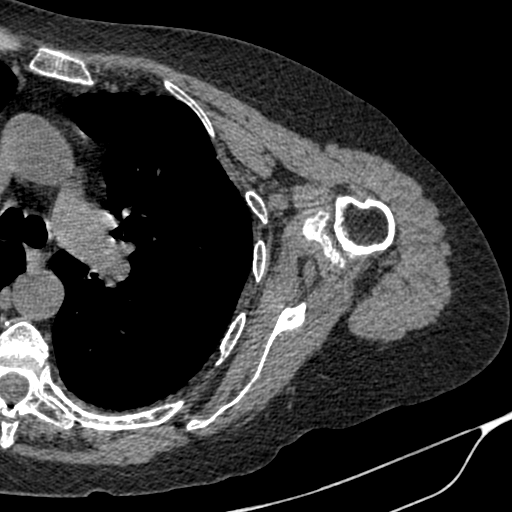
[im 133/265  bone]
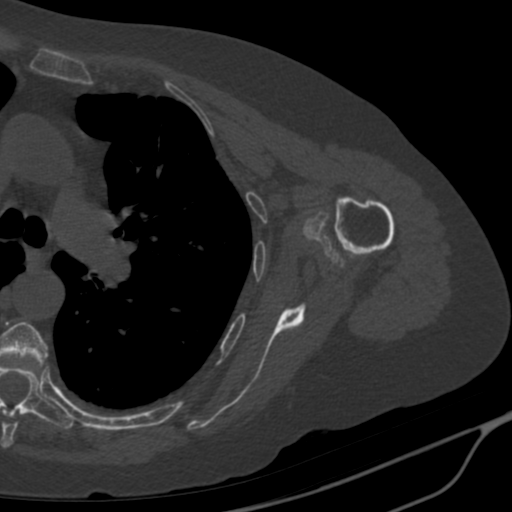
[im 159/265  bone]
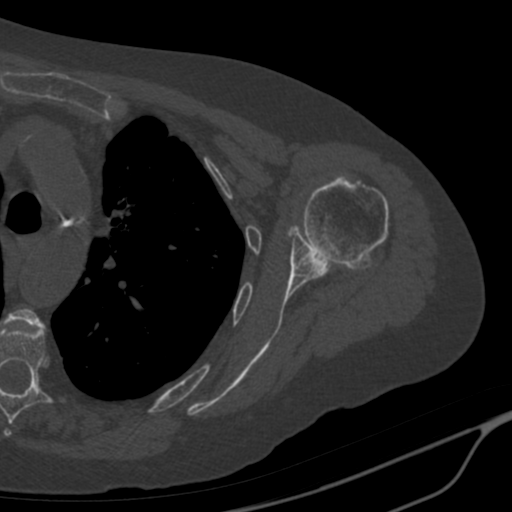
[im 185/265  bone]
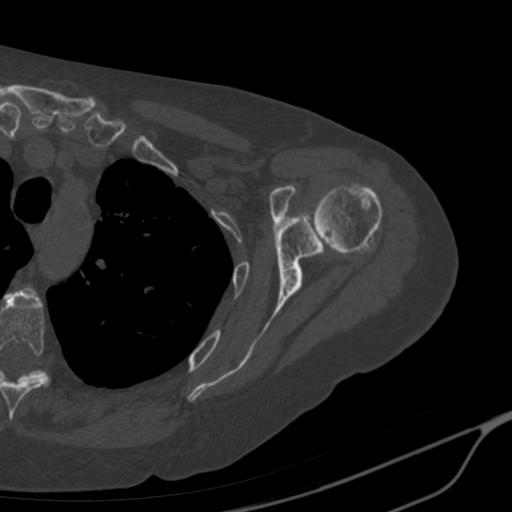
[im 212/265  bone]
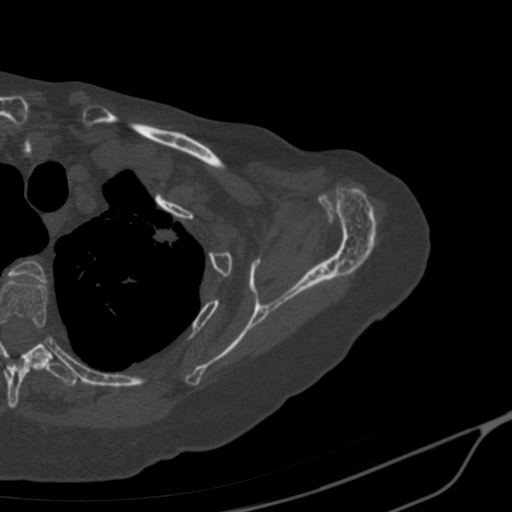
[im 238/265  soft-tissue]
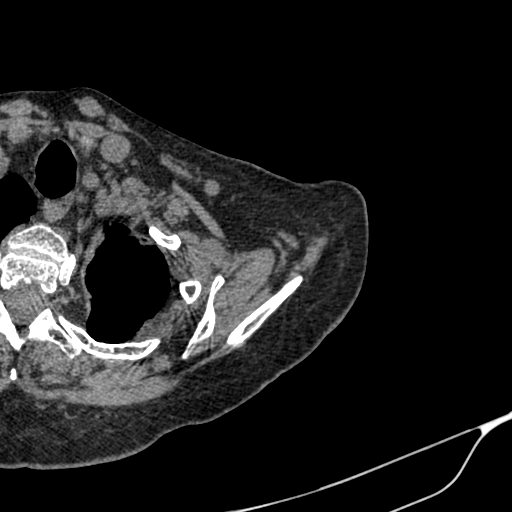
[im 238/265  bone]
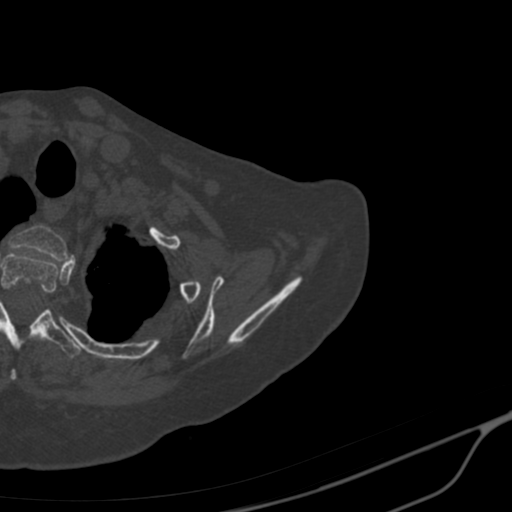

[9 of 14 positions shown; findings below may reference images not displayed]

FINDINGS: Bones/Joint/Cartilage

No fracture or dislocation. Normal alignment. No joint effusion.

Severe left glenohumeral joint space narrowing with subchondral
cystic changes and subchondral sclerosis. Inferior humeral marginal
osteophytes. Tiny inferior glenoid marginal osteophyte.

Mild arthropathy of the acromioclavicular joint.  Type I acromion.

No aggressive osseous lesion.

Ligaments

Ligaments are suboptimally evaluated by CT.

Muscles and Tendons
Muscles are normal.  No muscle atrophy.

Soft tissue
No fluid collection or hematoma. No soft tissue mass. Right lung
demonstrates no focal consolidation, pleural effusion or
pneumothorax. Calcified 5 mm and 11 mm left upper lobe pulmonary
nodule consistent with sequela prior granulomatous disease. Other
smaller pulmonary nodules are noted. Mild bilateral upper and lower
lobe bronchiectasis. Thoracic aortic atherosclerosis. 5 mm hypodense
right thyroid nodule.
IMPRESSION: 1. Severe advanced osteoarthritis of the left glenohumeral joint.
2.  Aortic Atherosclerosis ([id number].K).

## 2019-08-19 ENCOUNTER — Ambulatory Visit (INDEPENDENT_AMBULATORY_CARE_PROVIDER_SITE_OTHER): Payer: Medicare Other | Admitting: Allergy and Immunology

## 2019-08-19 ENCOUNTER — Other Ambulatory Visit: Payer: Self-pay

## 2019-08-19 ENCOUNTER — Encounter: Payer: Self-pay | Admitting: Allergy and Immunology

## 2019-08-19 VITALS — BP 142/50 | HR 56 | Temp 97.9°F | Resp 16

## 2019-08-19 DIAGNOSIS — Z862 Personal history of diseases of the blood and blood-forming organs and certain disorders involving the immune mechanism: Secondary | ICD-10-CM | POA: Diagnosis not present

## 2019-08-19 DIAGNOSIS — J3089 Other allergic rhinitis: Secondary | ICD-10-CM

## 2019-08-19 DIAGNOSIS — B309 Viral conjunctivitis, unspecified: Secondary | ICD-10-CM | POA: Diagnosis not present

## 2019-08-19 DIAGNOSIS — J4550 Severe persistent asthma, uncomplicated: Secondary | ICD-10-CM

## 2019-08-19 NOTE — Progress Notes (Signed)
[facility] [place]   Follow-up Note  Referring Provider: [facility], [place] Primary Provider: Physicians, [place] [facility] Family Date of Office Visit: 08/19/2019  Subjective:   [name] (DOB: Oct 03, 1946) is a 73 y.o. female who returns to the [facility] [place] on 08/19/2019 in re-evaluation of the following:  HPI: Mikeia returns to this clinic in reevaluation of asthma, history of pulmonary sarcoidosis, allergic rhinitis, and LPR.  Her last visit to this clinic was 03 July 2019 at which point in time she appeared to have a inflammatory airway flare for which she took prednisone 10 mg a day for 10 days.  She did very well with that therapy administered during her last visit and had no complaints regarding her airway and did not require systemic steroid or an antibiotic and rarely used a short acting bronchodilator and her reflux was under excellent control and overall was feeling very happy about her health status.  Unfortunately, yesterday she started to develop profuse watery eyes and profuse runny nose and sneezing without any other associated systemic or constitutional symptoms or respiratory tract symptoms.  She has no ugly nasal discharge or anosmia or fever or cough or wheeze.  There have been no contacts with a similar type of issue.  Her husband located at home has no similar issues.  Allergies as of 08/19/2019      Reactions   Codeine Nausea And Vomiting   Sulfa Antibiotics Other (See Comments)   Allergic per pt's mother       Medication List    albuterol (2.5 MG/3ML) 0.083% nebulizer solution Commonly known as: PROVENTIL Take 3 mLs (2.5 mg total) by nebulization every 4 (four) hours as needed for wheezing or shortness of breath.   albuterol 108 (90 Base) MCG/ACT inhaler Commonly known as: VENTOLIN HFA INHALE 2 PUFFS BY MOUTH 4 TIMES A DAY AS NEEDED   amLODipine 5 MG tablet Commonly known as: NORVASC Take 5 mg  by mouth daily.   aspirin EC 81 MG tablet Take 81 mg by mouth daily.   atorvastatin 20 MG tablet Commonly known as: LIPITOR Take 20 mg by mouth every evening.   beclomethasone 80 MCG/ACT inhaler Commonly known as: Qvar RediHaler Inhale two doses twice daily during flare-up.  Rinse, gargle, and spit after use.   budesonide-formoterol 160-4.5 MCG/ACT inhaler Commonly known as: SYMBICORT Inhale 2 puffs into the lungs 2 (two) times daily.   cholecalciferol 1000 units tablet Commonly known as: VITAMIN D Take 1,000 Units by mouth daily.   citalopram 20 MG tablet Commonly known as: CELEXA Take 20 mg by mouth daily.   dicyclomine 10 MG capsule Commonly known as: BENTYL   EPINEPHrine 0.3 mg/0.3 mL Soaj injection Commonly known as: EPI-PEN USE AS DIRECTED FOR LIFE THREATENING ALLERGIC REACTION   famotidine 40 MG tablet Commonly known as: PEPCID   fluticasone 50 MCG/ACT nasal spray Commonly known as: FLONASE USE ONE SPRAY IN EACH NOSTRIL ONCE OR TWICE DAILY   levocetirizine 5 MG tablet Commonly known as: XYZAL TAKE 1 TABLET BY MOUTH EVERYDAY AT BEDTIME   montelukast 10 MG tablet Commonly known as: SINGULAIR Take 10 mg by mouth daily.   omalizumab 150 MG injection Commonly known as: XOLAIR Inject 150 mg into the skin every 28 (twenty-eight) days.   omeprazole 40 MG capsule Commonly known as: PRILOSEC TAKE 1 CAPSULE BY MOUTH TWICE A DAY   pioglitazone 30 MG tablet Commonly known as: ACTOS Take 30  mg by mouth daily.   PROBIOTIC DAILY PO Take 1 capsule by mouth at bedtime.   telmisartan 80 MG tablet Commonly known as: MICARDIS Take 80 mg by mouth daily.   Victoza 18 MG/3ML Sopn Generic drug: liraglutide Inject 0.6 mg into the skin daily.   VISINE OP Place 1 drop into both eyes daily as needed (dry eyes).   Vitamin B-12 5000 MCG Lozg Take 5,000 mcg by mouth daily.       Past Medical History:  Diagnosis Date  . Allergy   . Anxiety   . Arthritis   .  Asthma   . Cataract    bilateral and removed  . Depression   . Diaphragmatic hernia without mention of obstruction or gangrene   . DM (diabetes mellitus) ([place])   . Dyspnea   . Esophageal reflux   . Esophagitis, unspecified   . H/O adenomatous polyp of colon 2011  . Heart murmur    no cardiologist, family Dr. Maryfrances Bunnell  . History of kidney stones   . Hyperlipidemia   . Incontinence of feces   . Irritable bowel syndrome   . Kidney stones   . OSA (obstructive sleep apnea) 09/28/2018  . Sarcoidosis   . Sleep apnea    no cpap now  . Status post dilation of esophageal narrowing   . Unspecified asthma(493.90)   . Unspecified essential hypertension   . Unspecified hearing loss     Past Surgical History:  Procedure Laterality Date  . BLADDER SURGERY    . CATARACT EXTRACTION Bilateral   . HARDWARE REMOVAL Left 01/12/2017   Procedure: HARDWARE REMOVAL left wrist;  Surgeon: Leanora Cover, MD;  Location: [facility];  Service: Orthopedics;  Laterality: Left;  . HEMORRHOID SURGERY    . NASAL SEPTUM SURGERY    . PARTIAL HYSTERECTOMY    . REVERSE SHOULDER ARTHROPLASTY Left 02/22/2018  . REVERSE SHOULDER ARTHROPLASTY Left 02/22/2018   Procedure: LEFT REVERSE SHOULDER ARTHROPLASTY;  Surgeon: Meredith Pel, MD;  Location: [facility];  Service: Orthopedics;  Laterality: Left;  . TONSILLECTOMY AND ADENOIDECTOMY    . TRIGGER FINGER RELEASE Left 05/12/2016   Procedure: RELEASE TRIGGER FINGER/A-1 PULLEY INFECTION LEFT RING TENDON SHEATH INJECT RIGHT INDEX FINGER;  Surgeon: Leanora Cover, MD;  Location: [place];  Service: Orthopedics;  Laterality: Left;  . TUBAL LIGATION      Review of systems negative except as noted in HPI / PMHx or noted below:  Review of Systems  Constitutional: Negative.   HENT: Negative.   Eyes: Negative.   Respiratory: Negative.   Cardiovascular: Negative.   Gastrointestinal: Negative.   Genitourinary: Negative.   Musculoskeletal:  Negative.   Skin: Negative.   Neurological: Negative.   Endo/Heme/Allergies: Negative.   Psychiatric/Behavioral: Negative.      Objective:   Vitals:   08/19/19 1146  BP: (!) 142/50  Pulse: (!) 56  Resp: 16  Temp: 97.9 F (36.6 C)  SpO2: 96%          Physical Exam Constitutional:      Appearance: She is not diaphoretic.  HENT:     Head: Normocephalic.     Right Ear: Tympanic membrane, ear canal and external ear normal.     Left Ear: Tympanic membrane, ear canal and external ear normal.     Nose: Nose normal. No mucosal edema or rhinorrhea.     Mouth/Throat:     Pharynx: Uvula midline. No oropharyngeal exudate.  Eyes:     Conjunctiva/sclera:  Right eye: Right conjunctiva is injected.     Left eye: Left conjunctiva is injected.  Neck:     Thyroid: No thyromegaly.     Trachea: Trachea normal. No tracheal tenderness or tracheal deviation.  Cardiovascular:     Rate and Rhythm: Normal rate and regular rhythm.     Heart sounds: Normal heart sounds, S1 normal and S2 normal. No murmur.  Pulmonary:     Effort: No respiratory distress.     Breath sounds: Normal breath sounds. No stridor. No wheezing or rales.  Lymphadenopathy:     Head:     Right side of head: No tonsillar adenopathy.     Left side of head: No tonsillar adenopathy.     Cervical: No cervical adenopathy.  Skin:    Findings: No erythema or rash.     Nails: There is no clubbing.   Neurological:     Mental Status: She is alert.     Diagnostics:    Spirometry was performed and demonstrated an FEV1 of 0.97 at 53 % of predicted.  Assessment and Plan:   1. Asthma, severe persistent, well-controlled   2. History of sarcoidosis   3. Other allergic rhinitis   4. Viral conjunctivitis of both eyes     1. Continue Xolair and Epi-Pen  2. Continue Symbicort 160 -2 inhalations 2 times per day with spacer  3. Continue Flonase - one spray each nostril 1-2 times per day  4. Continue omeprazole 40 twice  a day + Famotidine 40mg  in evening  5. Continue nasal saline, ProAir HFA, Duoneb if needed.  6. Can add OTC Loratadine 10 mg one tablet 1-2 times per day if needed  7. Can add OTC Mucinex DM 1-2 tablet 1-2 times per day if needed  8. Add SAMPLES of Qvar 80 REDIHALER two inhalations two times per day to Symbicort during "flare up".  9. Can add ipratropium 0.06% nasal spray - 1-2 sprays each nostril every 6 hours if needed  10. For this recent episode start:   A. OTC systane eye drops a few times per day to sooth eyes  B. Use the nasal ipratropium spray several times a day  C. Do not rub or itch eyes or touch eyes  D. Wash hands to prevent spread of infection  11. Return to clinic in December 2020 or earlier if problem  12. Obtain fall flu vaccine (and COVID vaccine)  Victorya has some form of infection that appears to be infecting her upper airway and her conjunctiva and will treat her with the therapy noted above assuming that she will do well.  I doubt this is COVID given the fact that she had extensive exposure to [place] when her husband became quite sick earlier this year with that form of virus.  She obviously had extensive exposure at that point as her husband was coughing at the time that he was febrile for about a week without using a mask and while Seria and her husband were sharing their Symbicort inhaler.  Assuming she does well she will continue on a collection of agents directed against respiratory tract inflammation including her biological agent and continue to treat reflux.  I will see her back in this clinic in December 2020 or earlier if there is a problem.  Allena Katz, MD [facility]

## 2019-08-19 NOTE — Patient Instructions (Signed)
  1. Continue Xolair and Epi-Pen  2. Continue Symbicort 160 -2 inhalations 2 times per day with spacer  3. Continue Flonase - one spray each nostril 1-2 times per day  4. Continue omeprazole 40 twice a day + Famotidine 40mg  in evening  5. Continue nasal saline, ProAir HFA, Duoneb if needed.  6. Can add OTC Loratadine 10 mg one tablet 1-2 times per day if needed  7. Can add OTC Mucinex DM 1-2 tablet 1-2 times per day if needed  8. Add SAMPLES of Qvar 80 REDIHALER two inhalations two times per day to Symbicort during "flare up".  9. Can add ipratropium 0.06% nasal spray - 1-2 sprays each nostril every 6 hours if needed  10. For this recent episode start:   A. OTC systane eye drops a few times per day to sooth eyes  B. Use the nasal ipratropium spray several times a day  C. Do not rub or itch eyes or touch eyes  D. Wash hands to prevent spread of infection  11. Return to clinic in December 2020 or earlier if problem  12. Obtain fall flu vaccine (and COVID vaccine)

## 2019-08-20 ENCOUNTER — Encounter: Payer: Self-pay | Admitting: Allergy and Immunology

## 2019-08-21 DIAGNOSIS — M545 Low back pain: Secondary | ICD-10-CM | POA: Diagnosis not present

## 2019-08-21 DIAGNOSIS — K5900 Constipation, unspecified: Secondary | ICD-10-CM | POA: Diagnosis not present

## 2019-08-21 DIAGNOSIS — Z6831 Body mass index (BMI) 31.0-31.9, adult: Secondary | ICD-10-CM | POA: Diagnosis not present

## 2019-08-21 DIAGNOSIS — R109 Unspecified abdominal pain: Secondary | ICD-10-CM | POA: Diagnosis not present

## 2019-08-27 DIAGNOSIS — J4550 Severe persistent asthma, uncomplicated: Secondary | ICD-10-CM | POA: Diagnosis not present

## 2019-08-27 DIAGNOSIS — Z23 Encounter for immunization: Secondary | ICD-10-CM | POA: Diagnosis not present

## 2019-08-28 ENCOUNTER — Ambulatory Visit (INDEPENDENT_AMBULATORY_CARE_PROVIDER_SITE_OTHER): Payer: Medicare Other | Admitting: *Deleted

## 2019-08-28 ENCOUNTER — Other Ambulatory Visit: Payer: Self-pay

## 2019-08-28 DIAGNOSIS — J4550 Severe persistent asthma, uncomplicated: Secondary | ICD-10-CM

## 2019-08-28 DIAGNOSIS — E119 Type 2 diabetes mellitus without complications: Secondary | ICD-10-CM | POA: Diagnosis not present

## 2019-08-28 DIAGNOSIS — I10 Essential (primary) hypertension: Secondary | ICD-10-CM | POA: Diagnosis not present

## 2019-09-04 DIAGNOSIS — Z6831 Body mass index (BMI) 31.0-31.9, adult: Secondary | ICD-10-CM | POA: Diagnosis not present

## 2019-09-04 DIAGNOSIS — E119 Type 2 diabetes mellitus without complications: Secondary | ICD-10-CM | POA: Diagnosis not present

## 2019-09-04 DIAGNOSIS — K589 Irritable bowel syndrome without diarrhea: Secondary | ICD-10-CM | POA: Diagnosis not present

## 2019-09-17 DIAGNOSIS — H90A31 Mixed conductive and sensorineural hearing loss, unilateral, right ear with restricted hearing on the contralateral side: Secondary | ICD-10-CM | POA: Diagnosis not present

## 2019-09-17 DIAGNOSIS — Z461 Encounter for fitting and adjustment of hearing aid: Secondary | ICD-10-CM | POA: Diagnosis not present

## 2019-09-18 DIAGNOSIS — D649 Anemia, unspecified: Secondary | ICD-10-CM | POA: Diagnosis not present

## 2019-09-18 DIAGNOSIS — E119 Type 2 diabetes mellitus without complications: Secondary | ICD-10-CM | POA: Diagnosis not present

## 2019-09-18 DIAGNOSIS — Z79899 Other long term (current) drug therapy: Secondary | ICD-10-CM | POA: Diagnosis not present

## 2019-09-18 DIAGNOSIS — E559 Vitamin D deficiency, unspecified: Secondary | ICD-10-CM | POA: Diagnosis not present

## 2019-09-23 ENCOUNTER — Other Ambulatory Visit: Payer: Self-pay

## 2019-09-23 ENCOUNTER — Ambulatory Visit (INDEPENDENT_AMBULATORY_CARE_PROVIDER_SITE_OTHER): Payer: Medicare Other | Admitting: Podiatry

## 2019-09-23 DIAGNOSIS — E1169 Type 2 diabetes mellitus with other specified complication: Secondary | ICD-10-CM | POA: Diagnosis not present

## 2019-09-23 DIAGNOSIS — E1151 Type 2 diabetes mellitus with diabetic peripheral angiopathy without gangrene: Secondary | ICD-10-CM

## 2019-09-23 DIAGNOSIS — B351 Tinea unguium: Secondary | ICD-10-CM

## 2019-09-23 NOTE — Progress Notes (Signed)
Subjective:  Patient ID: [name], female    DOB: 22-Jan-1946,  MRN: [id number]  Chief Complaint  Patient presents with  . debride    Diabetic nail trimming  . Diabetes    FBS: 138 A1C: 6.3 PCP: [facility] x couple wks    73 y.o. female presents  for diabetic foot care. Hx as above.  Review of Systems: Negative except as noted in the HPI. Denies N/V/F/Ch.  Past Medical History:  Diagnosis Date  . Allergy   . Anxiety   . Arthritis   . Asthma   . Cataract    bilateral and removed  . Depression   . Diaphragmatic hernia without mention of obstruction or gangrene   . DM (diabetes mellitus) ([place])   . Dyspnea   . Esophageal reflux   . Esophagitis, unspecified   . H/O adenomatous polyp of colon 2011  . Heart murmur    no cardiologist, family Dr. Maryfrances Bunnell  . History of kidney stones   . Hyperlipidemia   . Incontinence of feces   . Irritable bowel syndrome   . Kidney stones   . OSA (obstructive sleep apnea) 09/28/2018  . Sarcoidosis   . Sleep apnea    no cpap now  . Status post dilation of esophageal narrowing   . Unspecified asthma(493.90)   . Unspecified essential hypertension   . Unspecified hearing loss     Current Outpatient Medications:  .  albuterol (PROVENTIL HFA;VENTOLIN HFA) 108 (90 Base) MCG/ACT inhaler, INHALE 2 PUFFS BY MOUTH 4 TIMES A DAY AS NEEDED, Disp: 18 Inhaler, Rfl: 1 .  albuterol (PROVENTIL) (2.5 MG/3ML) 0.083% nebulizer solution, Take 3 mLs (2.5 mg total) by nebulization every 4 (four) hours as needed for wheezing or shortness of breath., Disp: 50 vial, Rfl: 1 .  amLODipine (NORVASC) 5 MG tablet, Take 5 mg by mouth daily., Disp: , Rfl: 2 .  aspirin EC 81 MG tablet, Take 81 mg by mouth daily., Disp: , Rfl:  .  atorvastatin (LIPITOR) 20 MG tablet, Take 20 mg by mouth every evening. , Disp: , Rfl:  .  beclomethasone (QVAR REDIHALER) 80 MCG/ACT inhaler, Inhale two doses twice daily during flare-up.  Rinse, gargle, and spit after use., Disp: 1  Inhaler, Rfl: 0 .  budesonide-formoterol (SYMBICORT) 160-4.5 MCG/ACT inhaler, Inhale 2 puffs into the lungs 2 (two) times daily., Disp: , Rfl:  .  cholecalciferol (VITAMIN D) 1000 units tablet, Take 1,000 Units by mouth daily., Disp: , Rfl:  .  citalopram (CELEXA) 20 MG tablet, Take 20 mg by mouth daily.  , Disp: , Rfl:  .  Cyanocobalamin (VITAMIN B-12) 5000 MCG LOZG, Take 5,000 mcg by mouth daily., Disp: , Rfl:  .  dicyclomine (BENTYL) 10 MG capsule, , Disp: , Rfl:  .  EPINEPHrine 0.3 mg/0.3 mL IJ SOAJ injection, USE AS DIRECTED FOR LIFE THREATENING ALLERGIC REACTION, Disp: , Rfl:  .  famotidine (PEPCID) 40 MG tablet, , Disp: , Rfl:  .  fluticasone (FLONASE) 50 MCG/ACT nasal spray, USE ONE SPRAY IN EACH NOSTRIL ONCE OR TWICE DAILY, Disp: 16 g, Rfl: 5 .  furosemide (LASIX) 40 MG tablet, , Disp: , Rfl:  .  levocetirizine (XYZAL) 5 MG tablet, TAKE 1 TABLET BY MOUTH EVERYDAY AT BEDTIME, Disp: , Rfl:  .  liraglutide (VICTOZA) 18 MG/3ML SOPN, Inject 0.6 mg into the skin daily. , Disp: , Rfl:  .  montelukast (SINGULAIR) 10 MG tablet, Take 10 mg by mouth daily., Disp: , Rfl:  .  omalizumab (XOLAIR) 150 MG injection, Inject 150 mg into the skin every 28 (twenty-eight) days. , Disp: , Rfl:  .  omeprazole (PRILOSEC) 40 MG capsule, TAKE 1 CAPSULE BY MOUTH TWICE A DAY, Disp: 60 capsule, Rfl: 5 .  oxybutynin (DITROPAN XL) 15 MG 24 hr tablet, , Disp: , Rfl:  .  pioglitazone (ACTOS) 30 MG tablet, Take 30 mg by mouth daily., Disp: , Rfl:  .  Probiotic Product (PROBIOTIC DAILY PO), Take 1 capsule by mouth at bedtime. , Disp: , Rfl:  .  telmisartan (MICARDIS) 80 MG tablet, Take 80 mg by mouth daily., Disp: , Rfl:  .  Tetrahydrozoline HCl (VISINE OP), Place 1 drop into both eyes daily as needed (dry eyes)., Disp: , Rfl:   Current Facility-Administered Medications:  .  omalizumab Arvid Right) injection 150 mg, 150 mg, Subcutaneous, Q28 days, Kozlow, Donnamarie Poag, MD, 150 mg at 08/28/19 1447  Social History   Tobacco Use   Smoking Status Never Smoker  Smokeless Tobacco Never Used    Allergies  Allergen Reactions  . Codeine Nausea And Vomiting  . Sulfa Antibiotics Other (See Comments)    Allergic per pt's mother    Objective:   There were no vitals filed for this visit. There is no height or weight on file to calculate BMI. Constitutional Well developed. Well nourished.  Vascular Dorsalis pedis pulses present 1+ bilaterally  Posterior tibial pulses absent bilaterally  Pedal hair growth diminished. Capillary refill normal to all digits.  No cyanosis or clubbing noted.  Neurologic Normal speech. Oriented to person, place, and time. Epicritic sensation to light touch grossly present bilaterally. Protective sensation with 5.07 monofilament  present bilaterally. Vibratory sensation present bilaterally.  Dermatologic Nails elongated, thickened, dystrophic. No open wounds. No skin lesions.  Orthopedic: Normal joint ROM without pain or crepitus bilaterally. No visible deformities. No bony tenderness.   Assessment:   1. Onychomycosis of multiple toenails with type 2 diabetes mellitus and peripheral angiopathy ([place])    Plan:  Patient was evaluated and treated and all questions answered.  Diabetes with PAD, Onychomycosis -Routine foot care as below  Procedure: Nail Debridement Rationale: Patient meets criteria for routine foot care due to PAD Type of Debridement: manual, sharp debridement. Instrumentation: Nail nipper, rotary burr. Number of Nails: 10       No follow-ups on file.

## 2019-09-24 DIAGNOSIS — J4550 Severe persistent asthma, uncomplicated: Secondary | ICD-10-CM | POA: Diagnosis not present

## 2019-09-25 ENCOUNTER — Other Ambulatory Visit: Payer: Self-pay

## 2019-09-25 ENCOUNTER — Ambulatory Visit (INDEPENDENT_AMBULATORY_CARE_PROVIDER_SITE_OTHER): Payer: Medicare Other | Admitting: *Deleted

## 2019-09-25 DIAGNOSIS — J4550 Severe persistent asthma, uncomplicated: Secondary | ICD-10-CM

## 2019-09-27 DIAGNOSIS — I10 Essential (primary) hypertension: Secondary | ICD-10-CM | POA: Diagnosis not present

## 2019-09-27 DIAGNOSIS — E119 Type 2 diabetes mellitus without complications: Secondary | ICD-10-CM | POA: Diagnosis not present

## 2019-10-07 DIAGNOSIS — E7800 Pure hypercholesterolemia, unspecified: Secondary | ICD-10-CM | POA: Diagnosis not present

## 2019-10-07 DIAGNOSIS — I10 Essential (primary) hypertension: Secondary | ICD-10-CM | POA: Diagnosis not present

## 2019-10-07 DIAGNOSIS — Z13820 Encounter for screening for osteoporosis: Secondary | ICD-10-CM | POA: Diagnosis not present

## 2019-10-07 DIAGNOSIS — E119 Type 2 diabetes mellitus without complications: Secondary | ICD-10-CM | POA: Diagnosis not present

## 2019-10-07 DIAGNOSIS — Z0000 Encounter for general adult medical examination without abnormal findings: Secondary | ICD-10-CM | POA: Diagnosis not present

## 2019-10-07 DIAGNOSIS — Z136 Encounter for screening for cardiovascular disorders: Secondary | ICD-10-CM | POA: Diagnosis not present

## 2019-10-16 DIAGNOSIS — N3946 Mixed incontinence: Secondary | ICD-10-CM | POA: Diagnosis not present

## 2019-10-16 DIAGNOSIS — R350 Frequency of micturition: Secondary | ICD-10-CM | POA: Diagnosis not present

## 2019-10-18 ENCOUNTER — Ambulatory Visit (INDEPENDENT_AMBULATORY_CARE_PROVIDER_SITE_OTHER): Payer: Medicare Other | Admitting: Nurse Practitioner

## 2019-10-18 ENCOUNTER — Encounter: Payer: Self-pay | Admitting: Nurse Practitioner

## 2019-10-18 VITALS — BP 138/62 | HR 57 | Temp 97.7°F | Ht 60.0 in | Wt 172.0 lb

## 2019-10-18 DIAGNOSIS — K5900 Constipation, unspecified: Secondary | ICD-10-CM | POA: Diagnosis not present

## 2019-10-18 DIAGNOSIS — M810 Age-related osteoporosis without current pathological fracture: Secondary | ICD-10-CM | POA: Diagnosis not present

## 2019-10-18 NOTE — Progress Notes (Signed)
ASSESSMENT / PLAN:    89. 73 yo female with hx of IBS, previously followed by Dr. Olevia Perches. Historically had problems with loose stool now with constipation.  -She discontinued Linzess, it caused constipation. She used to take daily Metamucil, thinks it worked well. Long history of fecal leakage, evaluated at fecal incontinence clinic at [facility] nearly 20 years ago.  -She only drinks 1, maybe 2 cups of water a day. We discussed reasons by more water intake is necessary. She will increase water intake to at least 6 glasses day. Encouraged to use flavored drops.  -She is interested in trying Metamucil again.  -Call in 2-3 weeks, if not improving then will try Amitiza -Will think about seeing pelvic floor PT   2. Colon cancer screening.  Due for 10-year recall colonoscopy 2026.   HPI:    Referring Provider:   Serita Grammes    Reason for referral:    constipation  Chief Complaint:   constipation  Ms. Masi is a 73 yo female with a hx of IBS, HTN, DM2, asthma, sarcoidosis, anxiety / depression and GERD. She was previously treated by Dr. Olevia Perches. She was evaluated years ago at [facility] in the incontinence clinic and found to have pelvic relaxation. She had a complete colonoscopy in July 2016 with findings as below  Ms Lohmeier previously struggled with frequent loose stool, now has problems with constipation. Stools are like pellets. Still have fecal leakage (formed stool) especially when urinating. Marland Kitchen PCP tried her on Linzess but it caused diarrhea so she stopped it after a few doses. After stopping Lizess she became constipated again, started stool softeners. Still having "small pieces" of stool. No associated abdominal or rectal pain.    Data Review:   08/21/19  Acute abdominal series - normal bowel gas pattern without obstruction  09/17/19 CMET -Essentially normal A1c  - 6.2 B12  ->2000   Previous GI evaluation:   Colonoscopy Olevia Perches) 06/03/19 for loose stool   ENDOSCOPIC IMPRESSION: 1. Sessile polyp was found at the cecum; polypectomy was performed with a cold snare polyp not retrieved, biopsy of the postpolypectomy site obtained instead, 2. There was mild diverticulosis noted in the left colon 3.random biopsies of the colon obtained to rule out microscopic colitis  Diagnosis 1. Surgical [P], cecum, polyp - POLYPOID COLORECTAL MUCOSA. NO ADENOMATOUS CHANGE OR MALIGNANCY IDENTIFIED. 2. Surgical [P], random sites - BENIGN COLONIC MUCOSA WITH FOCAL MINIMAL ACTIVE INFLAMMATION. - MELANOSIS COLI. - NO ADENOMATOUS EPITHELIUM OR MALIGNANCY IDENTIFIED.    Past Medical History:  Diagnosis Date  . Allergy   . Anxiety   . Arthritis   . Asthma   . Cataract    bilateral and removed  . Depression   . Diaphragmatic hernia without mention of obstruction or gangrene   . DM (diabetes mellitus) ([place])   . Dyspnea   . Esophageal reflux   . Esophagitis, unspecified   . H/O adenomatous polyp of colon 2011  . Heart murmur    no cardiologist, family Dr. Maryfrances Bunnell  . History of kidney stones   . Hyperlipidemia   . Incontinence of feces   . Irritable bowel syndrome   . Kidney stones   . OSA (obstructive sleep apnea) 09/28/2018  . Sarcoidosis   . Sleep apnea    no cpap now  . Status post dilation of esophageal narrowing   . Unspecified asthma(493.90)   . Unspecified essential hypertension   .  Unspecified hearing loss      Past Surgical History:  Procedure Laterality Date  . BLADDER SURGERY    . CATARACT EXTRACTION Bilateral   . HARDWARE REMOVAL Left 01/12/2017   Procedure: HARDWARE REMOVAL left wrist;  Surgeon: Leanora Cover, MD;  Location: [facility];  Service: Orthopedics;  Laterality: Left;  . HEMORRHOID SURGERY    . NASAL SEPTUM SURGERY    . PARTIAL HYSTERECTOMY    . REVERSE SHOULDER ARTHROPLASTY Left 02/22/2018  . REVERSE SHOULDER ARTHROPLASTY Left 02/22/2018   Procedure: LEFT REVERSE SHOULDER ARTHROPLASTY;  Surgeon: Meredith Pel, MD;  Location: [facility];  Service: Orthopedics;  Laterality: Left;  . TONSILLECTOMY AND ADENOIDECTOMY    . TRIGGER FINGER RELEASE Left 05/12/2016   Procedure: RELEASE TRIGGER FINGER/A-1 PULLEY INFECTION LEFT RING TENDON SHEATH INJECT RIGHT INDEX FINGER;  Surgeon: Leanora Cover, MD;  Location: [facility];  Service: Orthopedics;  Laterality: Left;  . TUBAL LIGATION     Family History  Problem Relation Age of Onset  . Esophageal cancer Father   . Diabetes Mother   . Diabetes Maternal Grandmother   . Colon cancer Neg Hx   . Rectal cancer Neg Hx   . Stomach cancer Neg Hx    Social History   Tobacco Use  . Smoking status: Never Smoker  . Smokeless tobacco: Never Used  Substance Use Topics  . Alcohol use: No  . Drug use: No   Current Outpatient Medications  Medication Sig Dispense Refill  . albuterol (PROVENTIL HFA;VENTOLIN HFA) 108 (90 Base) MCG/ACT inhaler INHALE 2 PUFFS BY MOUTH 4 TIMES A DAY AS NEEDED 18 Inhaler 1  . albuterol (PROVENTIL) (2.5 MG/3ML) 0.083% nebulizer solution Take 3 mLs (2.5 mg total) by nebulization every 4 (four) hours as needed for wheezing or shortness of breath. 50 vial 1  . amLODipine (NORVASC) 5 MG tablet Take 5 mg by mouth daily.  2  . aspirin EC 81 MG tablet Take 81 mg by mouth daily.    Marland Kitchen atorvastatin (LIPITOR) 20 MG tablet Take 20 mg by mouth every evening.     . beclomethasone (QVAR REDIHALER) 80 MCG/ACT inhaler Inhale two doses twice daily during flare-up.  Rinse, gargle, and spit after use. 1 Inhaler 0  . budesonide-formoterol (SYMBICORT) 160-4.5 MCG/ACT inhaler Inhale 2 puffs into the lungs 2 (two) times daily.    . cholecalciferol (VITAMIN D) 1000 units tablet Take 1,000 Units by mouth daily.    . citalopram (CELEXA) 20 MG tablet Take 20 mg by mouth daily.      . Cyanocobalamin (VITAMIN B-12) 5000 MCG LOZG Take 5,000 mcg by mouth daily.    Marland Kitchen dicyclomine (BENTYL) 10 MG capsule     . EPINEPHrine 0.3 mg/0.3 mL IJ SOAJ injection  USE AS DIRECTED FOR LIFE THREATENING ALLERGIC REACTION    . famotidine (PEPCID) 40 MG tablet     . fluticasone (FLONASE) 50 MCG/ACT nasal spray USE ONE SPRAY IN EACH NOSTRIL ONCE OR TWICE DAILY 16 g 5  . furosemide (LASIX) 40 MG tablet     . levocetirizine (XYZAL) 5 MG tablet TAKE 1 TABLET BY MOUTH EVERYDAY AT BEDTIME    . liraglutide (VICTOZA) 18 MG/3ML SOPN Inject 0.6 mg into the skin daily.     . montelukast (SINGULAIR) 10 MG tablet Take 10 mg by mouth daily.    Marland Kitchen omalizumab (XOLAIR) 150 MG injection Inject 150 mg into the skin every 28 (twenty-eight) days.     Marland Kitchen omeprazole (PRILOSEC)  40 MG capsule TAKE 1 CAPSULE BY MOUTH TWICE A DAY 60 capsule 5  . oxybutynin (DITROPAN XL) 15 MG 24 hr tablet     . pioglitazone (ACTOS) 30 MG tablet Take 30 mg by mouth daily.    . Probiotic Product (PROBIOTIC DAILY PO) Take 1 capsule by mouth at bedtime.     Marland Kitchen telmisartan (MICARDIS) 80 MG tablet Take 80 mg by mouth daily.    . Tetrahydrozoline HCl (VISINE OP) Place 1 drop into both eyes daily as needed (dry eyes).     Current Facility-Administered Medications  Medication Dose Route Frequency Provider Last Rate Last Dose  . omalizumab Arvid Right) injection 150 mg  150 mg Subcutaneous Q28 days Jiles Prows, MD   150 mg at 09/25/19 1408   Allergies  Allergen Reactions  . Codeine Nausea And Vomiting  . Sulfa Antibiotics Other (See Comments)    Allergic per pt's mother      Review of Systems: Positive for hearing problems, shortness of breath, swelling of feel / legs and urine leakage. All other systems reviewed and negative except where noted in HPI.   Physical Exam:    Wt Readings from Last 3 Encounters:  10/18/19 172 lb (78 kg)  07/05/19 171 lb 6.4 oz (77.7 kg)  06/06/19 172 lb 9.6 oz (78.3 kg)    Temp 97.7 F (36.5 C)   Ht 5' (1.524 m)   Wt 172 lb (78 kg)   BMI 33.59 kg/m  Constitutional:  Pleasant female in no acute distress. Psychiatric: Normal mood and affect. Behavior is normal.  EENT: Pupils normal.  Conjunctivae are normal. No scleral icterus. Neck supple.  Cardiovascular: Normal rate, regular rhythm. No edema Pulmonary/chest: Effort normal and breath sounds normal. No wheezing, rales or rhonchi. Abdominal: Soft, nondistended, nontender. Bowel sounds active throughout. There are no masses palpable. No hepatomegaly. Neurological: Alert and oriented to person place and time. Skin: Skin is warm and dry. No rashes noted.  Tye Savoy, NP  10/18/2019, 11:35 AM  Cc:  Serita Grammes, MD

## 2019-10-18 NOTE — Patient Instructions (Addendum)
If you are age 73 or older, your body mass index should be between 23-30. Your Body mass index is 33.59 kg/m. If this is out of the aforementioned range listed, please consider follow up with your Primary Care Provider.  If you are age 27 or younger, your body mass index should be between 19-25. Your Body mass index is 33.59 kg/m. If this is out of the aformentioned range listed, please consider follow up with your Primary Care Provider.   Start Metamucil once daily as directed.  You have been given samples of Fiber Choice.  Increase water to 6-8 8 ounces glasses daily.  Call back with an update in 2-3 weeks.  Thank you for choosing me and [facility].   Tye Savoy, NP

## 2019-10-22 DIAGNOSIS — J4550 Severe persistent asthma, uncomplicated: Secondary | ICD-10-CM | POA: Diagnosis not present

## 2019-10-23 ENCOUNTER — Ambulatory Visit (INDEPENDENT_AMBULATORY_CARE_PROVIDER_SITE_OTHER): Payer: Medicare Other

## 2019-10-23 DIAGNOSIS — J4550 Severe persistent asthma, uncomplicated: Secondary | ICD-10-CM | POA: Diagnosis not present

## 2019-10-25 ENCOUNTER — Encounter: Payer: Self-pay | Admitting: Nurse Practitioner

## 2019-10-28 NOTE — Progress Notes (Signed)
Reviewed and agree with documentation and assessment and plan. K. Veena Nashea Chumney , MD   

## 2019-11-11 ENCOUNTER — Ambulatory Visit (INDEPENDENT_AMBULATORY_CARE_PROVIDER_SITE_OTHER): Payer: Medicare Other | Admitting: Allergy and Immunology

## 2019-11-11 ENCOUNTER — Other Ambulatory Visit: Payer: Self-pay

## 2019-11-11 ENCOUNTER — Encounter: Payer: Self-pay | Admitting: Allergy and Immunology

## 2019-11-11 VITALS — BP 166/62 | HR 54 | Temp 98.8°F | Resp 19

## 2019-11-11 DIAGNOSIS — Z862 Personal history of diseases of the blood and blood-forming organs and certain disorders involving the immune mechanism: Secondary | ICD-10-CM | POA: Diagnosis not present

## 2019-11-11 DIAGNOSIS — J4550 Severe persistent asthma, uncomplicated: Secondary | ICD-10-CM | POA: Diagnosis not present

## 2019-11-11 DIAGNOSIS — J3089 Other allergic rhinitis: Secondary | ICD-10-CM | POA: Diagnosis not present

## 2019-11-11 DIAGNOSIS — K219 Gastro-esophageal reflux disease without esophagitis: Secondary | ICD-10-CM | POA: Diagnosis not present

## 2019-11-11 MED ORDER — DULERA 200-5 MCG/ACT IN AERO: INHALATION_SPRAY | RESPIRATORY_TRACT | 5 refills | Status: DC

## 2019-11-11 MED ORDER — IPRATROPIUM BROMIDE 0.06 % NA SOLN: NASAL | 5 refills | Status: DC

## 2019-11-11 NOTE — Progress Notes (Signed)
[place]   Follow-up Note  Referring Provider: [facility], [place] Primary Provider: Serita Grammes, MD Date of Office Visit: 11/11/2019  Subjective:   [name] (DOB: 06/30/1946) is a 73 y.o. female who returns to the [facility] on 11/11/2019 in re-evaluation of the following:  HPI: [name] returns to this clinic in reevaluation of asthma and a history of pulmonary sarcoidosis and allergic rhinitis and LPR.  Her last visit to this clinic was 16 February 2019.  Overall she has done relatively well since her last visit but she has noticed over the course of the past month she has had a little bit more problems breathing and may be a little bit more wheezing and may be a little more slight cough.  She has not had any sputum production or chest pain or lower extremity swelling or any significant upper airway symptoms accompanying this issue.  She continues on all of her anti-inflammatory medications for her airway at this point.  She continues on Symbicort and Flonase and Xolair.  For some reason she has been using her proair HFA in conjunction with her Symbicort twice a day every day.  Her insurance company will be changing her Symbicort to [facility].  She believes that her reflux is under very good control.  She does occasionally have drainage in her throat and throat clearing and sometimes it feels as though she cannot clear out her throat.  She continues on proton pump inhibitor and H2 receptor blocker.  Allergies as of 11/11/2019      Reactions   Codeine Nausea And Vomiting   Sulfa Antibiotics Other (See Comments)   Allergic per pt's mother       Medication List      albuterol (2.5 MG/3ML) 0.083% nebulizer solution Commonly known as: PROVENTIL Take 3 mLs (2.5 mg total) by nebulization every 4 (four) hours as needed for wheezing or shortness of breath.   albuterol 108 (90 Base) MCG/ACT inhaler Commonly  known as: VENTOLIN HFA INHALE 2 PUFFS BY MOUTH 4 TIMES A DAY AS NEEDED   amLODipine 5 MG tablet Commonly known as: NORVASC Take 5 mg by mouth daily.   aspirin EC 81 MG tablet Take 81 mg by mouth daily.   atorvastatin 20 MG tablet Commonly known as: LIPITOR Take 20 mg by mouth every evening.   beclomethasone 80 MCG/ACT inhaler Commonly known as: Qvar RediHaler Inhale two doses twice daily during flare-up.  Rinse, gargle, and spit after use.   budesonide-formoterol 160-4.5 MCG/ACT inhaler Commonly known as: SYMBICORT Inhale 2 puffs into the lungs 2 (two) times daily.   cholecalciferol 1000 units tablet Commonly known as: VITAMIN D Take 1,000 Units by mouth daily.   citalopram 20 MG tablet Commonly known as: CELEXA Take 20 mg by mouth daily.   dicyclomine 10 MG capsule Commonly known as: BENTYL   EPINEPHrine 0.3 mg/0.3 mL Soaj injection Commonly known as: EPI-PEN USE AS DIRECTED FOR LIFE THREATENING ALLERGIC REACTION   famotidine 40 MG tablet Commonly known as: PEPCID   fluticasone 50 MCG/ACT nasal spray Commonly known as: FLONASE USE ONE SPRAY IN EACH NOSTRIL ONCE OR TWICE DAILY   furosemide 40 MG tablet Commonly known as: LASIX   levocetirizine 5 MG tablet Commonly known as: XYZAL TAKE 1 TABLET BY MOUTH EVERYDAY AT BEDTIME   montelukast 10 MG tablet Commonly known as: SINGULAIR Take 10 mg by mouth daily.   omalizumab 150 MG injection  Commonly known as: XOLAIR Inject 150 mg into the skin every 28 (twenty-eight) days.   omeprazole 40 MG capsule Commonly known as: PRILOSEC TAKE 1 CAPSULE BY MOUTH TWICE A DAY   oxybutynin 15 MG 24 hr tablet Commonly known as: DITROPAN XL   pioglitazone 30 MG tablet Commonly known as: ACTOS Take 30 mg by mouth daily.   PROBIOTIC DAILY PO Take 1 capsule by mouth at bedtime.   telmisartan 80 MG tablet Commonly known as: MICARDIS Take 80 mg by mouth daily.   Victoza 18 MG/3ML Sopn Generic drug: liraglutide Inject  0.6 mg into the skin daily.   VISINE OP Place 1 drop into both eyes daily as needed (dry eyes).   Vitamin B-12 5000 MCG Lozg Take 5,000 mcg by mouth daily.       Past Medical History:  Diagnosis Date  . Allergy   . Anxiety   . Arthritis   . Asthma   . Cataract    bilateral and removed  . Depression   . Diaphragmatic hernia without mention of obstruction or gangrene   . DM (diabetes mellitus) ([place])   . Dyspnea   . Esophageal reflux   . Esophagitis, unspecified   . H/O adenomatous polyp of colon 2011  . Heart murmur    no cardiologist, family Dr. Maryfrances Bunnell  . History of kidney stones   . Hyperlipidemia   . Incontinence of feces   . Irritable bowel syndrome   . Kidney stones   . OSA (obstructive sleep apnea) 09/28/2018  . Sarcoidosis   . Sleep apnea    no cpap now  . Status post dilation of esophageal narrowing   . Unspecified asthma(493.90)   . Unspecified essential hypertension   . Unspecified hearing loss     Past Surgical History:  Procedure Laterality Date  . BLADDER SURGERY    . CATARACT EXTRACTION Bilateral   . HARDWARE REMOVAL Left 01/12/2017   Procedure: HARDWARE REMOVAL left wrist;  Surgeon: Leanora Cover, MD;  Location: [place];  Service: Orthopedics;  Laterality: Left;  . HEMORRHOID SURGERY    . NASAL SEPTUM SURGERY    . PARTIAL HYSTERECTOMY    . REVERSE SHOULDER ARTHROPLASTY Left 02/22/2018  . REVERSE SHOULDER ARTHROPLASTY Left 02/22/2018   Procedure: LEFT REVERSE SHOULDER ARTHROPLASTY;  Surgeon: Meredith Pel, MD;  Location: [place];  Service: Orthopedics;  Laterality: Left;  . TONSILLECTOMY AND ADENOIDECTOMY    . TRIGGER FINGER RELEASE Left 05/12/2016   Procedure: RELEASE TRIGGER FINGER/A-1 PULLEY INFECTION LEFT RING TENDON SHEATH INJECT RIGHT INDEX FINGER;  Surgeon: Leanora Cover, MD;  Location: [place];  Service: Orthopedics;  Laterality: Left;  . TUBAL LIGATION      Review of systems negative except as noted  in HPI / PMHx or noted below:  Review of Systems  Constitutional: Negative.   HENT: Negative.   Eyes: Negative.   Respiratory: Negative.   Cardiovascular: Negative.   Gastrointestinal: Negative.   Genitourinary: Negative.   Musculoskeletal: Negative.   Skin: Negative.   Neurological: Negative.   Endo/Heme/Allergies: Negative.   Psychiatric/Behavioral: Negative.      Objective:   Vitals:   11/11/19 1056 11/11/19 1058  BP:    Pulse:    Resp:    Temp:    SpO2: 98% 93%          Physical Exam Constitutional:      Appearance: She is not diaphoretic.  HENT:     Head: Normocephalic.  Right Ear: Tympanic membrane, ear canal and external ear normal.     Left Ear: Tympanic membrane, ear canal and external ear normal.     Nose: Nose normal. No mucosal edema or rhinorrhea.     Mouth/Throat:     Pharynx: Uvula midline. No oropharyngeal exudate.  Eyes:     Conjunctiva/sclera: Conjunctivae normal.  Neck:     Thyroid: No thyromegaly.     Trachea: Trachea normal. No tracheal tenderness or tracheal deviation.  Cardiovascular:     Rate and Rhythm: Normal rate and regular rhythm.     Heart sounds: S1 normal and S2 normal. Murmur (Systolic) present.  Pulmonary:     Effort: No respiratory distress.     Breath sounds: Normal breath sounds. No stridor. No wheezing or rales.  Lymphadenopathy:     Head:     Right side of head: No tonsillar adenopathy.     Left side of head: No tonsillar adenopathy.     Cervical: No cervical adenopathy.  Skin:    Findings: No erythema or rash.     Nails: There is no clubbing.  Neurological:     Mental Status: She is alert.     Diagnostics:    Spirometry was performed and demonstrated an FEV1 of 1.05 at 54 % of predicted.  Oxygen saturation at rest on room air was 98%.  Oxygen saturation during walking the hallway on room air was 93%.  Assessment and Plan:   1. Not well controlled severe persistent asthma   2. History of sarcoidosis     3. Other allergic rhinitis   4. LPRD (laryngopharyngeal reflux disease)     1. Continue Xolair and Epi-Pen  2. Continue Symbicort 160 / Dulera  200 -2 inhalations 2 times per day with spacer  3. Add SAMPLES of Qvar 80 REDIHALER two inhalations two times per day to Symbicort / Dulera during "flare up".  4. Continue Flonase - one spray each nostril 1-2 times per day  5. Continue omeprazole 40 twice a day + Famotidine 40mg  in evening  6. Continue nasal saline, ProAir HFA, Duoneb if needed.  7. Can add OTC Loratadine 10 mg one tablet 1-2 times per day if needed  8. Can add OTC Mucinex DM 1-2 tablet 1-2 times per day if needed  9. Can add ipratropium 0.06% nasal spray - 1-2 sprays each nostril every 6 hours if needed  10. Return to clinic in 12 weeks or earlier if problem  11. Obtain COVID vaccine when available  I do not know why Betsua is developing a problem with her respiratory tract in the face of all of her medical therapy.  Her spirometry is stable from her last visit.  She does not appear to have any exercise-induced deoxygenation.  There is no evidence that there is heart failure.  I have asked her to add in some Qvar to her combination inhaler whether it be Symbicort or Dulera pending insurance approval and we will see what happens over the course of the next several weeks.  Allena Katz, MD Allergy / Immunology [place]

## 2019-11-11 NOTE — Patient Instructions (Addendum)
  1. Continue Xolair and Epi-Pen  2. Continue Symbicort 160 / Dulera  200 -2 inhalations 2 times per day with spacer  3. Add SAMPLES of Qvar 80 REDIHALER two inhalations two times per day to Symbicort / Dulera during "flare up".  4. Continue Flonase - one spray each nostril 1-2 times per day  5. Continue omeprazole 40 twice a day + Famotidine 40mg  in evening  6. Continue nasal saline, ProAir HFA, Duoneb if needed.  7. Can add OTC Loratadine 10 mg one tablet 1-2 times per day if needed  8. Can add OTC Mucinex DM 1-2 tablet 1-2 times per day if needed  9. Can add ipratropium 0.06% nasal spray - 1-2 sprays each nostril every 6 hours if needed  10. Return to clinic in 12 weeks or earlier if problem  11. Obtain COVID vaccine when available

## 2019-11-12 ENCOUNTER — Encounter: Payer: Self-pay | Admitting: Allergy and Immunology

## 2019-11-20 ENCOUNTER — Ambulatory Visit: Payer: Self-pay

## 2019-11-20 DIAGNOSIS — J4550 Severe persistent asthma, uncomplicated: Secondary | ICD-10-CM | POA: Diagnosis not present

## 2019-11-25 ENCOUNTER — Ambulatory Visit (INDEPENDENT_AMBULATORY_CARE_PROVIDER_SITE_OTHER): Payer: Medicare Other | Admitting: *Deleted

## 2019-11-25 DIAGNOSIS — J4550 Severe persistent asthma, uncomplicated: Secondary | ICD-10-CM

## 2019-12-11 DIAGNOSIS — J4541 Moderate persistent asthma with (acute) exacerbation: Secondary | ICD-10-CM | POA: Diagnosis not present

## 2019-12-11 DIAGNOSIS — J90 Pleural effusion, not elsewhere classified: Secondary | ICD-10-CM | POA: Diagnosis not present

## 2019-12-11 DIAGNOSIS — Z9181 History of falling: Secondary | ICD-10-CM | POA: Diagnosis not present

## 2019-12-11 DIAGNOSIS — Z1331 Encounter for screening for depression: Secondary | ICD-10-CM | POA: Diagnosis not present

## 2019-12-13 DIAGNOSIS — Z1231 Encounter for screening mammogram for malignant neoplasm of breast: Secondary | ICD-10-CM | POA: Diagnosis not present

## 2019-12-13 DIAGNOSIS — J90 Pleural effusion, not elsewhere classified: Secondary | ICD-10-CM | POA: Diagnosis not present

## 2019-12-13 DIAGNOSIS — J4541 Moderate persistent asthma with (acute) exacerbation: Secondary | ICD-10-CM | POA: Diagnosis not present

## 2019-12-23 ENCOUNTER — Ambulatory Visit (INDEPENDENT_AMBULATORY_CARE_PROVIDER_SITE_OTHER): Payer: Medicare Other | Admitting: Podiatry

## 2019-12-23 ENCOUNTER — Other Ambulatory Visit: Payer: Self-pay

## 2019-12-23 DIAGNOSIS — B351 Tinea unguium: Secondary | ICD-10-CM | POA: Diagnosis not present

## 2019-12-23 DIAGNOSIS — E1169 Type 2 diabetes mellitus with other specified complication: Secondary | ICD-10-CM

## 2019-12-23 DIAGNOSIS — E1151 Type 2 diabetes mellitus with diabetic peripheral angiopathy without gangrene: Secondary | ICD-10-CM | POA: Diagnosis not present

## 2019-12-23 NOTE — Progress Notes (Signed)
  Subjective:  Patient ID: [name], female    DOB: [birth year],  MRN: [id number]  Chief Complaint  Patient presents with  . debride    diabetic nail trimming  . Diabetes    FBS: 123 x 1 day A1C: 6.3 PCP: [facility] x 2 wks    74 y.o. female presents with the above complaint. History confirmed with patient.   Objective:  Physical Exam: warm, good capillary refill, nail exam onychomycosis of the toenails, no trophic changes or ulcerative lesions, normal DP and reduced PT pulses, and normal sensory exam. Left Foot: normal exam, no swelling, tenderness, instability; ligaments intact, full range of motion of all ankle/foot joints  Right Foot: normal exam, no swelling, tenderness, instability; ligaments intact, full range of motion of all ankle/foot joints   No images are attached to the encounter.  Assessment:   1. Onychomycosis of multiple toenails with type 2 diabetes mellitus and peripheral angiopathy ([place])      Plan:  Patient was evaluated and treated and all questions answered.  Onychomycosis, Diabetes and PAD -Patient is diabetic with a qualifying condition for at risk foot care.  Procedure: Nail Debridement Rationale: Patient meets criteria for routine foot care due to PAD Type of Debridement: manual, sharp debridement. Instrumentation: Nail nipper, rotary burr. Number of Nails: 10    No follow-ups on file.   MDM

## 2019-12-24 DIAGNOSIS — J4550 Severe persistent asthma, uncomplicated: Secondary | ICD-10-CM | POA: Diagnosis not present

## 2019-12-25 ENCOUNTER — Ambulatory Visit (INDEPENDENT_AMBULATORY_CARE_PROVIDER_SITE_OTHER): Payer: Medicare Other

## 2019-12-25 DIAGNOSIS — J4550 Severe persistent asthma, uncomplicated: Secondary | ICD-10-CM | POA: Diagnosis not present

## 2019-12-25 DIAGNOSIS — M1711 Unilateral primary osteoarthritis, right knee: Secondary | ICD-10-CM | POA: Diagnosis not present

## 2019-12-25 DIAGNOSIS — Z6831 Body mass index (BMI) 31.0-31.9, adult: Secondary | ICD-10-CM | POA: Diagnosis not present

## 2019-12-25 DIAGNOSIS — J4540 Moderate persistent asthma, uncomplicated: Secondary | ICD-10-CM | POA: Diagnosis not present

## 2019-12-25 DIAGNOSIS — E669 Obesity, unspecified: Secondary | ICD-10-CM | POA: Diagnosis not present

## 2019-12-28 DIAGNOSIS — K219 Gastro-esophageal reflux disease without esophagitis: Secondary | ICD-10-CM | POA: Diagnosis not present

## 2019-12-28 DIAGNOSIS — E119 Type 2 diabetes mellitus without complications: Secondary | ICD-10-CM | POA: Diagnosis not present

## 2019-12-28 DIAGNOSIS — I10 Essential (primary) hypertension: Secondary | ICD-10-CM | POA: Diagnosis not present

## 2020-01-08 DIAGNOSIS — J4540 Moderate persistent asthma, uncomplicated: Secondary | ICD-10-CM | POA: Diagnosis not present

## 2020-01-08 DIAGNOSIS — Z6831 Body mass index (BMI) 31.0-31.9, adult: Secondary | ICD-10-CM | POA: Diagnosis not present

## 2020-01-08 DIAGNOSIS — I10 Essential (primary) hypertension: Secondary | ICD-10-CM | POA: Diagnosis not present

## 2020-01-08 DIAGNOSIS — E119 Type 2 diabetes mellitus without complications: Secondary | ICD-10-CM | POA: Diagnosis not present

## 2020-01-09 ENCOUNTER — Other Ambulatory Visit: Payer: Self-pay

## 2020-01-09 ENCOUNTER — Ambulatory Visit (INDEPENDENT_AMBULATORY_CARE_PROVIDER_SITE_OTHER): Payer: Medicare Other | Admitting: Cardiology

## 2020-01-09 ENCOUNTER — Encounter: Payer: Self-pay | Admitting: Cardiology

## 2020-01-09 VITALS — BP 144/84 | HR 64 | Temp 97.9°F | Ht 60.0 in | Wt 173.0 lb

## 2020-01-09 DIAGNOSIS — I10 Essential (primary) hypertension: Secondary | ICD-10-CM

## 2020-01-09 DIAGNOSIS — I350 Nonrheumatic aortic (valve) stenosis: Secondary | ICD-10-CM | POA: Diagnosis not present

## 2020-01-09 DIAGNOSIS — I340 Nonrheumatic mitral (valve) insufficiency: Secondary | ICD-10-CM | POA: Diagnosis not present

## 2020-01-09 DIAGNOSIS — E088 Diabetes mellitus due to underlying condition with unspecified complications: Secondary | ICD-10-CM

## 2020-01-09 NOTE — Patient Instructions (Signed)
Medication Instructions:  No medication changes *If you need a refill on your cardiac medications before your next appointment, please call your pharmacy*  Lab Work: None ordered If you have labs (blood work) drawn today and your tests are completely normal, you will receive your results only by: . MyChart Message (if you have MyChart) OR . A paper copy in the mail If you have any lab test that is abnormal or we need to change your treatment, we will call you to review the results.  Testing/Procedures: None ordered  Follow-Up: At [facility], you and your health needs are our priority.  As part of our continuing mission to provide you with exceptional heart care, we have created designated Provider Care Teams.  These Care Teams include your primary Cardiologist (physician) and Advanced Practice Providers (APPs -  Physician Assistants and Nurse Practitioners) who all work together to provide you with the care you need, when you need it.  Your next appointment:   6 month(s)  The format for your next appointment:   In Person  Provider:   Rajan Revankar, MD  Other Instructions NA 

## 2020-01-09 NOTE — Progress Notes (Signed)
Cardiology Office Note:    Date:  01/09/2020   ID:  [name], DOB [birth year], MRN [id number]  PCP:  Serita Grammes, MD  Cardiologist:  Jenean Lindau, MD   Referring MD: Serita Grammes, MD    ASSESSMENT:    1. Essential hypertension   2. Mild aortic stenosis   3. Mitral valve insufficiency, unspecified etiology   4. Diabetes mellitus due to underlying condition with unspecified complications ([place])    PLAN:    In order of problems listed above:  1. Prevention stressed with the patient.  Importance plans with diet and medication stressed and she vocalized understanding. 2. Blood pressure is stable.  She tells me that she was a little aggravated today and so her blood pressure is elevated.  She takes his at home and it is fine. 3. Mild aortic stenosis: Medical 4. Mild to moderate mitral regurgitation: Again medical management with optimization of blood pressure.  I discussed with her diet with salt intake issues and regular exercise and she promises to do better.  She is overweight.  She will diet to lose weight also.  Her blood work is followed by primary care physician. 5. Patient will be seen in follow-up appointment in 6 months or earlier if the patient has any concerns    Medication Adjustments/Labs and Tests Ordered: Current medicines are reviewed at length with the patient today.  Concerns regarding medicines are outlined above.  No orders of the defined types were placed in this encounter.  No orders of the defined types were placed in this encounter.    No chief complaint on file.    History of Present Illness:    [name] is a 74 y.o. female.  Patient has past medical history of essential hypertension, mild aortic stenosis, mild to moderate mitral regurgitation.  She denies any problems at this time and takes care of activities of daily living.  No chest pain orthopnea or PND.  At the time of my evaluation, the patient is alert awake oriented  and in no distress.  She leads a sedentary lifestyle.  Past Medical History:  Diagnosis Date  . Allergy   . Anxiety   . Arthritis   . Asthma   . Cataract    bilateral and removed  . Depression   . Diaphragmatic hernia without mention of obstruction or gangrene   . DM (diabetes mellitus) ([place])   . Dyspnea   . Esophageal reflux   . Esophagitis, unspecified   . H/O adenomatous polyp of colon 2011  . Heart murmur    no cardiologist, family Dr. Maryfrances Bunnell  . History of kidney stones   . Hyperlipidemia   . Incontinence of feces   . Irritable bowel syndrome   . Kidney stones   . OSA (obstructive sleep apnea) 09/28/2018  . Sarcoidosis   . Sleep apnea    no cpap now  . Status post dilation of esophageal narrowing   . Unspecified asthma(493.90)   . Unspecified essential hypertension   . Unspecified hearing loss     Past Surgical History:  Procedure Laterality Date  . BLADDER SURGERY    . CATARACT EXTRACTION Bilateral   . HARDWARE REMOVAL Left 01/12/2017   Procedure: HARDWARE REMOVAL left wrist;  Surgeon: Leanora Cover, MD;  Location: [place];  Service: Orthopedics;  Laterality: Left;  . HEMORRHOID SURGERY    . NASAL SEPTUM SURGERY    . PARTIAL HYSTERECTOMY    . REVERSE SHOULDER ARTHROPLASTY Left  02/22/2018  . REVERSE SHOULDER ARTHROPLASTY Left 02/22/2018   Procedure: LEFT REVERSE SHOULDER ARTHROPLASTY;  Surgeon: Meredith Pel, MD;  Location: [place];  Service: Orthopedics;  Laterality: Left;  . TONSILLECTOMY AND ADENOIDECTOMY    . TRIGGER FINGER RELEASE Left 05/12/2016   Procedure: RELEASE TRIGGER FINGER/A-1 PULLEY INFECTION LEFT RING TENDON SHEATH INJECT RIGHT INDEX FINGER;  Surgeon: Leanora Cover, MD;  Location: [place];  Service: Orthopedics;  Laterality: Left;  . TUBAL LIGATION      Current Medications: Current Meds  Medication Sig  . albuterol (PROVENTIL HFA;VENTOLIN HFA) 108 (90 Base) MCG/ACT inhaler INHALE 2 PUFFS BY MOUTH 4 TIMES A  DAY AS NEEDED  . albuterol (PROVENTIL) (2.5 MG/3ML) 0.083% nebulizer solution Take 3 mLs (2.5 mg total) by nebulization every 4 (four) hours as needed for wheezing or shortness of breath.  Marland Kitchen amLODipine (NORVASC) 5 MG tablet Take 5 mg by mouth daily.  Marland Kitchen aspirin EC 81 MG tablet Take 81 mg by mouth daily.  Marland Kitchen atorvastatin (LIPITOR) 20 MG tablet Take 20 mg by mouth every evening.   . cholecalciferol (VITAMIN D) 1000 units tablet Take 1,000 Units by mouth daily.  . citalopram (CELEXA) 20 MG tablet Take 20 mg by mouth daily.    . Cyanocobalamin (VITAMIN B-12) 5000 MCG LOZG Take 5,000 mcg by mouth daily.  Marland Kitchen dicyclomine (BENTYL) 10 MG capsule   . EPINEPHrine 0.3 mg/0.3 mL IJ SOAJ injection USE AS DIRECTED FOR LIFE THREATENING ALLERGIC REACTION  . fluticasone (FLONASE) 50 MCG/ACT nasal spray USE ONE SPRAY IN EACH NOSTRIL ONCE OR TWICE DAILY  . furosemide (LASIX) 40 MG tablet   . ipratropium (ATROVENT) 0.06 % nasal spray Can use one to two sprays in each nostril every six hours as needed to dry up runny nose.  . levocetirizine (XYZAL) 5 MG tablet TAKE 1 TABLET BY MOUTH EVERYDAY AT BEDTIME  . liraglutide (VICTOZA) 18 MG/3ML SOPN Inject 0.6 mg into the skin daily.   . montelukast (SINGULAIR) 10 MG tablet Take 10 mg by mouth daily.  Marland Kitchen omalizumab (XOLAIR) 150 MG injection Inject 150 mg into the skin every 28 (twenty-eight) days.   Marland Kitchen omeprazole (PRILOSEC) 40 MG capsule TAKE 1 CAPSULE BY MOUTH TWICE A DAY  . oxybutynin (DITROPAN XL) 15 MG 24 hr tablet   . pioglitazone (ACTOS) 30 MG tablet Take 30 mg by mouth daily.  . Probiotic Product (PROBIOTIC DAILY PO) Take 1 capsule by mouth at bedtime.   Marland Kitchen telmisartan (MICARDIS) 80 MG tablet Take 80 mg by mouth daily.  . Tetrahydrozoline HCl (VISINE OP) Place 1 drop into both eyes daily as needed (dry eyes).   Current Facility-Administered Medications for the 01/09/20 encounter (Office Visit) with Bilbo Carcamo, Reita Cliche, MD  Medication  . omalizumab Arvid Right) injection 150  mg     Allergies:   Codeine and Sulfa antibiotics   Social History   Socioeconomic History  . Marital status: Married    Spouse name: Not on file  . Number of children: 2  . Years of education: Not on file  . Highest education level: Not on file  Occupational History  . Occupation: retired  Tobacco Use  . Smoking status: Never Smoker  . Smokeless tobacco: Never Used  Substance and Sexual Activity  . Alcohol use: No  . Drug use: No  . Sexual activity: Not on file  Other Topics Concern  . Not on file  Social History Narrative   Daily caffeine use.    Social Determinants  of Health   Financial Resource Strain:   . Difficulty of Paying Living Expenses: Not on file  Food Insecurity:   . Worried About Charity fundraiser in the Last Year: Not on file  . Ran Out of Food in the Last Year: Not on file  Transportation Needs:   . Lack of Transportation (Medical): Not on file  . Lack of Transportation (Non-Medical): Not on file  Physical Activity:   . Days of Exercise per Week: Not on file  . Minutes of Exercise per Session: Not on file  Stress:   . Feeling of Stress : Not on file  Social Connections:   . Frequency of Communication with Friends and Family: Not on file  . Frequency of Social Gatherings with Friends and Family: Not on file  . Attends Religious Services: Not on file  . Active Member of Clubs or Organizations: Not on file  . Attends Archivist Meetings: Not on file  . Marital Status: Not on file     Family History: The patient's family history includes Diabetes in her maternal grandmother and mother; Esophageal cancer in her father. There is no history of Colon cancer, Rectal cancer, or Stomach cancer.  ROS:   Please see the history of present illness.    All other systems reviewed and are negative.  EKGs/Labs/Other Studies Reviewed:    The following studies were reviewed today: I discussed echocardiographic findings with the patient at length.   They are detailed below.   Recent Labs: 01/16/2019: Hemoglobin 9.9; Platelets 298  Recent Lipid Panel No results found for: CHOL, TRIG, HDL, CHOLHDL, VLDL, LDLCALC, LDLDIRECT  Physical Exam:    VS:  BP (!) 144/84   Pulse 64   Temp 97.9 F (36.6 C)   Ht 5' (1.524 m)   Wt 173 lb (78.5 kg)   SpO2 97%   BMI 33.79 kg/m     Wt Readings from Last 3 Encounters:  01/09/20 173 lb (78.5 kg)  10/18/19 172 lb (78 kg)  07/05/19 171 lb 6.4 oz (77.7 kg)     GEN: Patient is in no acute distress HEENT: Normal NECK: No JVD; No carotid bruits LYMPHATICS: No lymphadenopathy CARDIAC: Hear sounds regular, 2/6 systolic murmur at the apex. RESPIRATORY:  Clear to auscultation without rales, wheezing or rhonchi  ABDOMEN: Soft, non-tender, non-distended MUSCULOSKELETAL:  No edema; No deformity  SKIN: Warm and dry NEUROLOGIC:  Alert and oriented x 3 PSYCHIATRIC:  Normal affect   Signed, Jenean Lindau, MD  01/09/2020 2:44 PM    [place]

## 2020-01-13 DIAGNOSIS — R4182 Altered mental status, unspecified: Secondary | ICD-10-CM | POA: Diagnosis not present

## 2020-01-13 DIAGNOSIS — R42 Dizziness and giddiness: Secondary | ICD-10-CM | POA: Diagnosis not present

## 2020-01-13 DIAGNOSIS — I10 Essential (primary) hypertension: Secondary | ICD-10-CM | POA: Diagnosis not present

## 2020-01-13 DIAGNOSIS — R531 Weakness: Secondary | ICD-10-CM | POA: Diagnosis not present

## 2020-01-13 DIAGNOSIS — G459 Transient cerebral ischemic attack, unspecified: Secondary | ICD-10-CM | POA: Diagnosis not present

## 2020-01-13 DIAGNOSIS — E860 Dehydration: Secondary | ICD-10-CM | POA: Diagnosis not present

## 2020-01-13 DIAGNOSIS — D649 Anemia, unspecified: Secondary | ICD-10-CM | POA: Diagnosis not present

## 2020-01-13 DIAGNOSIS — J90 Pleural effusion, not elsewhere classified: Secondary | ICD-10-CM | POA: Diagnosis not present

## 2020-01-14 DIAGNOSIS — R531 Weakness: Secondary | ICD-10-CM | POA: Diagnosis not present

## 2020-01-14 DIAGNOSIS — I672 Cerebral atherosclerosis: Secondary | ICD-10-CM | POA: Diagnosis not present

## 2020-01-14 DIAGNOSIS — M2578 Osteophyte, vertebrae: Secondary | ICD-10-CM | POA: Diagnosis not present

## 2020-01-14 DIAGNOSIS — J45909 Unspecified asthma, uncomplicated: Secondary | ICD-10-CM | POA: Diagnosis present

## 2020-01-14 DIAGNOSIS — I352 Nonrheumatic aortic (valve) stenosis with insufficiency: Secondary | ICD-10-CM | POA: Diagnosis not present

## 2020-01-14 DIAGNOSIS — R4182 Altered mental status, unspecified: Secondary | ICD-10-CM | POA: Diagnosis not present

## 2020-01-14 DIAGNOSIS — F418 Other specified anxiety disorders: Secondary | ICD-10-CM | POA: Diagnosis present

## 2020-01-14 DIAGNOSIS — R410 Disorientation, unspecified: Secondary | ICD-10-CM | POA: Diagnosis not present

## 2020-01-14 DIAGNOSIS — M069 Rheumatoid arthritis, unspecified: Secondary | ICD-10-CM | POA: Diagnosis present

## 2020-01-14 DIAGNOSIS — R4781 Slurred speech: Secondary | ICD-10-CM | POA: Diagnosis not present

## 2020-01-14 DIAGNOSIS — R42 Dizziness and giddiness: Secondary | ICD-10-CM | POA: Diagnosis not present

## 2020-01-14 DIAGNOSIS — K219 Gastro-esophageal reflux disease without esophagitis: Secondary | ICD-10-CM | POA: Diagnosis not present

## 2020-01-14 DIAGNOSIS — Z79899 Other long term (current) drug therapy: Secondary | ICD-10-CM | POA: Diagnosis not present

## 2020-01-14 DIAGNOSIS — I361 Nonrheumatic tricuspid (valve) insufficiency: Secondary | ICD-10-CM | POA: Diagnosis not present

## 2020-01-14 DIAGNOSIS — E860 Dehydration: Secondary | ICD-10-CM | POA: Diagnosis present

## 2020-01-14 DIAGNOSIS — E7800 Pure hypercholesterolemia, unspecified: Secondary | ICD-10-CM | POA: Diagnosis present

## 2020-01-14 DIAGNOSIS — D649 Anemia, unspecified: Secondary | ICD-10-CM | POA: Diagnosis not present

## 2020-01-14 DIAGNOSIS — M1990 Unspecified osteoarthritis, unspecified site: Secondary | ICD-10-CM | POA: Diagnosis present

## 2020-01-14 DIAGNOSIS — Z885 Allergy status to narcotic agent status: Secondary | ICD-10-CM | POA: Diagnosis not present

## 2020-01-14 DIAGNOSIS — Z7982 Long term (current) use of aspirin: Secondary | ICD-10-CM | POA: Diagnosis not present

## 2020-01-14 DIAGNOSIS — I6523 Occlusion and stenosis of bilateral carotid arteries: Secondary | ICD-10-CM | POA: Diagnosis not present

## 2020-01-14 DIAGNOSIS — E1165 Type 2 diabetes mellitus with hyperglycemia: Secondary | ICD-10-CM | POA: Diagnosis not present

## 2020-01-14 DIAGNOSIS — Z882 Allergy status to sulfonamides status: Secondary | ICD-10-CM | POA: Diagnosis not present

## 2020-01-14 DIAGNOSIS — I340 Nonrheumatic mitral (valve) insufficiency: Secondary | ICD-10-CM | POA: Diagnosis not present

## 2020-01-14 DIAGNOSIS — M47812 Spondylosis without myelopathy or radiculopathy, cervical region: Secondary | ICD-10-CM | POA: Diagnosis not present

## 2020-01-14 DIAGNOSIS — J90 Pleural effusion, not elsewhere classified: Secondary | ICD-10-CM | POA: Diagnosis not present

## 2020-01-14 DIAGNOSIS — I10 Essential (primary) hypertension: Secondary | ICD-10-CM | POA: Diagnosis not present

## 2020-01-14 DIAGNOSIS — G459 Transient cerebral ischemic attack, unspecified: Secondary | ICD-10-CM | POA: Diagnosis not present

## 2020-01-15 DIAGNOSIS — E860 Dehydration: Secondary | ICD-10-CM | POA: Diagnosis not present

## 2020-01-15 DIAGNOSIS — G459 Transient cerebral ischemic attack, unspecified: Secondary | ICD-10-CM | POA: Diagnosis not present

## 2020-01-15 DIAGNOSIS — D649 Anemia, unspecified: Secondary | ICD-10-CM | POA: Diagnosis not present

## 2020-01-15 DIAGNOSIS — R4182 Altered mental status, unspecified: Secondary | ICD-10-CM | POA: Diagnosis not present

## 2020-01-16 ENCOUNTER — Other Ambulatory Visit: Payer: Self-pay

## 2020-01-16 ENCOUNTER — Inpatient Hospital Stay (HOSPITAL_COMMUNITY)
Admission: AD | Admit: 2020-01-16 | Discharge: 2020-01-18 | DRG: 039 | Disposition: A | Discharge status: Home / Self Care | Payer: Medicare Other | Source: Other Acute Inpatient Hospital | Attending: Internal Medicine | Admitting: Internal Medicine

## 2020-01-16 DIAGNOSIS — I10 Essential (primary) hypertension: Secondary | ICD-10-CM | POA: Diagnosis present

## 2020-01-16 DIAGNOSIS — Z6834 Body mass index (BMI) 34.0-34.9, adult: Secondary | ICD-10-CM

## 2020-01-16 DIAGNOSIS — Z800 Family history of malignant neoplasm of digestive organs: Secondary | ICD-10-CM

## 2020-01-16 DIAGNOSIS — E669 Obesity, unspecified: Secondary | ICD-10-CM | POA: Diagnosis present

## 2020-01-16 DIAGNOSIS — I350 Nonrheumatic aortic (valve) stenosis: Secondary | ICD-10-CM | POA: Diagnosis not present

## 2020-01-16 DIAGNOSIS — E119 Type 2 diabetes mellitus without complications: Secondary | ICD-10-CM | POA: Diagnosis present

## 2020-01-16 DIAGNOSIS — J45909 Unspecified asthma, uncomplicated: Secondary | ICD-10-CM | POA: Diagnosis present

## 2020-01-16 DIAGNOSIS — Z833 Family history of diabetes mellitus: Secondary | ICD-10-CM

## 2020-01-16 DIAGNOSIS — E7800 Pure hypercholesterolemia, unspecified: Secondary | ICD-10-CM | POA: Diagnosis present

## 2020-01-16 DIAGNOSIS — D649 Anemia, unspecified: Secondary | ICD-10-CM | POA: Diagnosis present

## 2020-01-16 DIAGNOSIS — R531 Weakness: Secondary | ICD-10-CM | POA: Diagnosis present

## 2020-01-16 DIAGNOSIS — Z79899 Other long term (current) drug therapy: Secondary | ICD-10-CM | POA: Diagnosis not present

## 2020-01-16 DIAGNOSIS — E785 Hyperlipidemia, unspecified: Secondary | ICD-10-CM | POA: Diagnosis present

## 2020-01-16 DIAGNOSIS — D869 Sarcoidosis, unspecified: Secondary | ICD-10-CM | POA: Diagnosis present

## 2020-01-16 DIAGNOSIS — R471 Dysarthria and anarthria: Secondary | ICD-10-CM | POA: Diagnosis present

## 2020-01-16 DIAGNOSIS — I6523 Occlusion and stenosis of bilateral carotid arteries: Secondary | ICD-10-CM | POA: Diagnosis present

## 2020-01-16 DIAGNOSIS — Z8673 Personal history of transient ischemic attack (TIA), and cerebral infarction without residual deficits: Secondary | ICD-10-CM

## 2020-01-16 DIAGNOSIS — G4733 Obstructive sleep apnea (adult) (pediatric): Secondary | ICD-10-CM | POA: Diagnosis present

## 2020-01-16 DIAGNOSIS — I6522 Occlusion and stenosis of left carotid artery: Secondary | ICD-10-CM | POA: Diagnosis not present

## 2020-01-16 DIAGNOSIS — Z20822 Contact with and (suspected) exposure to covid-19: Secondary | ICD-10-CM | POA: Diagnosis present

## 2020-01-16 DIAGNOSIS — J4550 Severe persistent asthma, uncomplicated: Secondary | ICD-10-CM | POA: Diagnosis not present

## 2020-01-16 HISTORY — DX: Anemia, unspecified: D64.9

## 2020-01-16 HISTORY — DX: Occlusion and stenosis of bilateral carotid arteries: I65.23

## 2020-01-16 HISTORY — DX: Personal history of transient ischemic attack (TIA), and cerebral infarction without residual deficits: Z86.73

## 2020-01-16 LAB — CBC
HCT: 31.1 % — ABNORMAL LOW (ref 36.0–46.0)
Hemoglobin: 9.7 g/dL — ABNORMAL LOW (ref 12.0–15.0)
MCH: 27.9 pg (ref 26.0–34.0)
MCHC: 31.2 g/dL (ref 30.0–36.0)
MCV: 89.4 fL (ref 80.0–100.0)
Platelets: 206 10*3/uL (ref 150–400)
RBC: 3.48 MIL/uL — ABNORMAL LOW (ref 3.87–5.11)
RDW: 17.2 % — ABNORMAL HIGH (ref 11.5–15.5)
WBC: 6.4 10*3/uL (ref 4.0–10.5)
nRBC: 0 % (ref 0.0–0.2)

## 2020-01-16 LAB — COMPREHENSIVE METABOLIC PANEL
ALT: 11 U/L (ref 0–44)
AST: 20 U/L (ref 15–41)
Albumin: 3.3 g/dL — ABNORMAL LOW (ref 3.5–5.0)
Alkaline Phosphatase: 54 U/L (ref 38–126)
Anion gap: 7 (ref 5–15)
BUN: 17 mg/dL (ref 8–23)
CO2: 25 mmol/L (ref 22–32)
Calcium: 8.8 mg/dL — ABNORMAL LOW (ref 8.9–10.3)
Chloride: 112 mmol/L — ABNORMAL HIGH (ref 98–111)
Creatinine, Ser: 0.87 mg/dL (ref 0.44–1.00)
GFR calc Af Amer: >60 mL/min (ref >60)
GFR calc non Af Amer: >60 mL/min (ref >60)
Glucose, Bld: 124 mg/dL — ABNORMAL HIGH (ref 70–99)
Potassium: 4.4 mmol/L (ref 3.5–5.1)
Sodium: 144 mmol/L (ref 135–145)
Total Bilirubin: 0.5 mg/dL (ref 0.3–1.2)
Total Protein: 5.8 g/dL — ABNORMAL LOW (ref 6.5–8.1)

## 2020-01-16 LAB — GLUCOSE, CAPILLARY
Glucose-Capillary: 103 mg/dL — ABNORMAL HIGH (ref 70–99)
Glucose-Capillary: 102 mg/dL — ABNORMAL HIGH (ref 70–99)
Glucose-Capillary: 143 mg/dL — ABNORMAL HIGH (ref 70–99)
Glucose-Capillary: 121 mg/dL — ABNORMAL HIGH (ref 70–99)
Glucose-Capillary: 110 mg/dL — ABNORMAL HIGH (ref 70–99)

## 2020-01-16 LAB — SARS CORONAVIRUS 2 (TAT 6-24 HRS): SARS Coronavirus 2: NEGATIVE

## 2020-01-16 LAB — SURGICAL PCR SCREEN
MRSA, PCR: NEGATIVE
Staphylococcus aureus: NEGATIVE

## 2020-01-16 MED ORDER — LABETALOL HCL 5 MG/ML IV SOLN: 10.0000 mg | INTRAVENOUS | Status: DC | PRN

## 2020-01-16 MED ORDER — CLOPIDOGREL BISULFATE 75 MG PO TABS
75.0000 mg | ORAL_TABLET | Freq: Every day | ORAL | Status: DC
  Administered 2020-01-16 – 2020-01-18 (×3): 75 mg via ORAL
  Filled 2020-01-16 (×3): qty 1

## 2020-01-16 MED ORDER — SODIUM CHLORIDE 0.9% FLUSH
3.0000 mL | Freq: Two times a day (BID) | INTRAVENOUS | Status: DC
  Administered 2020-01-16 – 2020-01-17 (×5): 3 mL via INTRAVENOUS

## 2020-01-16 MED ORDER — HYDROCODONE-ACETAMINOPHEN 5-325 MG PO TABS: 1.0000 | ORAL_TABLET | Freq: Four times a day (QID) | ORAL | Status: DC | PRN

## 2020-01-16 MED ORDER — SODIUM CHLORIDE 0.9 % IV SOLN: INTRAVENOUS | Status: DC

## 2020-01-16 MED ORDER — ATORVASTATIN CALCIUM 40 MG PO TABS
40.0000 mg | ORAL_TABLET | Freq: Every day | ORAL | Status: DC
  Administered 2020-01-16 – 2020-01-17 (×2): 40 mg via ORAL
  Filled 2020-01-16 (×2): qty 1

## 2020-01-16 MED ORDER — ASPIRIN EC 81 MG PO TBEC
81.0000 mg | DELAYED_RELEASE_TABLET | Freq: Every day | ORAL | Status: DC
  Administered 2020-01-16 – 2020-01-18 (×3): 81 mg via ORAL
  Filled 2020-01-16 (×3): qty 1

## 2020-01-16 MED ORDER — ONDANSETRON HCL 4 MG PO TABS: 4.0000 mg | ORAL_TABLET | Freq: Four times a day (QID) | ORAL | Status: DC | PRN

## 2020-01-16 MED ORDER — CLOPIDOGREL BISULFATE 75 MG PO TABS: 75.0000 mg | ORAL_TABLET | Freq: Every day | ORAL | Status: DC

## 2020-01-16 MED ORDER — ACETAMINOPHEN 650 MG RE SUPP: 650.0000 mg | Freq: Four times a day (QID) | RECTAL | Status: DC | PRN

## 2020-01-16 MED ORDER — POLYETHYLENE GLYCOL 3350 17 G PO PACK: 17.0000 g | PACK | Freq: Every day | ORAL | Status: DC | PRN

## 2020-01-16 MED ORDER — ONDANSETRON HCL 4 MG/2ML IJ SOLN: 4.0000 mg | Freq: Four times a day (QID) | INTRAMUSCULAR | Status: DC | PRN

## 2020-01-16 MED ORDER — ACETAMINOPHEN 325 MG PO TABS: 650.0000 mg | ORAL_TABLET | Freq: Four times a day (QID) | ORAL | Status: DC | PRN

## 2020-01-16 MED ORDER — ALBUTEROL SULFATE (2.5 MG/3ML) 0.083% IN NEBU: 2.5000 mg | INHALATION_SOLUTION | RESPIRATORY_TRACT | Status: DC | PRN

## 2020-01-16 MED ORDER — INSULIN ASPART 100 UNIT/ML ~~LOC~~ SOLN
0.0000 [IU] | SUBCUTANEOUS | Status: DC
  Administered 2020-01-16 (×2): 1 [IU] via SUBCUTANEOUS

## 2020-01-16 MED ORDER — INSULIN ASPART 100 UNIT/ML ~~LOC~~ SOLN: 0.0000 [IU] | Freq: Three times a day (TID) | SUBCUTANEOUS | Status: DC

## 2020-01-16 NOTE — Progress Notes (Signed)
Patient seen and examined, admitted earlier this morning by Dr. Myna Hidalgo -Briefly this is a 74 year old obese female with history of type 2 diabetes, hypertension, OSA, asthma presented to [facility] on 2/15 with generalized weakness, slurring of speech and gait issues. -Subsequently ended up having stroke, TIA work-up, her symptoms resolved by the time she reached the emergency room and was back to baseline, CT head was negative, MRI was also unremarkable, carotid ultrasound and CTA neck noted bilateral carotid artery disease, greater than 70% on the left, subsequently case was discussed with vascular surgery and she was transferred to [facility] for consideration of revascularization  1.  TIA -Symptomatic left carotid artery disease -Neurological symptoms resolved and back to baseline on the day of admission itself -Was on aspirin prior to admission, Plavix added at Rh -2D echocardiogram with normal EF, no cardiac source of embolus -Hemoglobin A1c is 5.9 -Continue statin -[facility] Dr. Trula Slade consulted, and neurology consulted per [facility] request  2.  Type 2 diabetes mellitus -Hemoglobin A1c was 5.9 -Continue sliding scale insulin  3.  Chronic normocytic anemia -Check anemia panel  For medical problems remained stable and as noted by Dr. Myna Hidalgo today  Domenic Polite, MD

## 2020-01-16 NOTE — H&P (View-Only) (Signed)
[facility] [place]  Patient name: [name] MRN: [id number] DOB: 1946/04/27 Sex: female   REQUESTING PROVIDER:    Hospitalists   REASON FOR CONSULT:    Carotid stenosis  HISTORY OF PRESENT ILLNESS:   [name] is a 74 y.o. female, who presented to [facility] on 01/13/2020 with weakness fatigue and slurred speech.  She had a CT scan which showed 70% bilateral carotid stenosis.  She denies any other symptoms such as amaurosis fugax or numbness or weakness in either extremity.  She has no residual deficits and feels that she is back to her baseline  Patient is medically managed for type 2 diabetes.  She takes a statin for hypercholesterolemia.  She is medically managed for hypertension.  She is a non-smoker.  PAST MEDICAL HISTORY    Past Medical History:  Diagnosis Date  . Allergy   . Anxiety   . Arthritis   . Asthma   . Cataract    bilateral and removed  . Depression   . Diaphragmatic hernia without mention of obstruction or gangrene   . DM (diabetes mellitus) ([place])   . Dyspnea   . Esophageal reflux   . Esophagitis, unspecified   . H/O adenomatous polyp of colon 2011  . Heart murmur    no cardiologist, family Dr. Maryfrances Bunnell  . History of kidney stones   . Hyperlipidemia   . Incontinence of feces   . Irritable bowel syndrome   . Kidney stones   . OSA (obstructive sleep apnea) 09/28/2018  . Sarcoidosis   . Sleep apnea    no cpap now  . Status post dilation of esophageal narrowing   . Unspecified asthma(493.90)   . Unspecified essential hypertension   . Unspecified hearing loss      FAMILY HISTORY   Family History  Problem Relation Age of Onset  . Esophageal cancer Father   . Diabetes Mother   . Diabetes Maternal Grandmother   . Colon cancer Neg Hx   . Rectal cancer Neg Hx   . Stomach cancer Neg Hx     SOCIAL HISTORY:   Social History   Socioeconomic History  . Marital  status: Married    Spouse name: Not on file  . Number of children: 2  . Years of education: Not on file  . Highest education level: Not on file  Occupational History  . Occupation: retired  Tobacco Use  . Smoking status: Never Smoker  . Smokeless tobacco: Never Used  Substance and Sexual Activity  . Alcohol use: No  . Drug use: No  . Sexual activity: Not on file  Other Topics Concern  . Not on file  Social History Narrative   Daily caffeine use.    Social Determinants of Health   Financial Resource Strain:   . Difficulty of Paying Living Expenses: Not on file  Food Insecurity:   . Worried About Charity fundraiser in the Last Year: Not on file  . Ran Out of Food in the Last Year: Not on file  Transportation Needs:   . Lack of Transportation (Medical): Not on file  . Lack of Transportation (Non-Medical): Not on file  Physical Activity:   . Days of Exercise per Week: Not on file  . Minutes of Exercise per Session: Not on file  Stress:   . Feeling of Stress : Not on file  Social Connections:   . Frequency of Communication with Friends and Family: Not on file  .  Frequency of Social Gatherings with Friends and Family: Not on file  . Attends Religious Services: Not on file  . Active Member of Clubs or Organizations: Not on file  . Attends Archivist Meetings: Not on file  . Marital Status: Not on file  Intimate Partner Violence:   . Fear of Current or Ex-Partner: Not on file  . Emotionally Abused: Not on file  . Physically Abused: Not on file  . Sexually Abused: Not on file    ALLERGIES:    Allergies  Allergen Reactions  . Codeine Nausea And Vomiting  . Sulfa Antibiotics Other (See Comments)    Allergic per pt's mother     CURRENT MEDICATIONS:    Current Facility-Administered Medications  Medication Dose Route Frequency Provider Last Rate Last Admin  . 0.9 %  sodium chloride infusion   Intravenous Continuous Opyd, Ilene Qua, MD 75 mL/hr at 01/16/20  0700 Rate Verify at 01/16/20 0700  . acetaminophen (TYLENOL) tablet 650 mg  650 mg Oral Q6H PRN Opyd, Ilene Qua, MD       Or  . acetaminophen (TYLENOL) suppository 650 mg  650 mg Rectal Q6H PRN Opyd, Ilene Qua, MD      . albuterol (PROVENTIL) (2.5 MG/3ML) 0.083% nebulizer solution 2.5 mg  2.5 mg Inhalation Q4H PRN Opyd, Ilene Qua, MD      . aspirin EC tablet 81 mg  81 mg Oral Daily Opyd, Ilene Qua, MD      . atorvastatin (LIPITOR) tablet 40 mg  40 mg Oral q1800 Opyd, Ilene Qua, MD      . clopidogrel (PLAVIX) tablet 75 mg  75 mg Oral Daily Opyd, Ilene Qua, MD      . HYDROcodone-acetaminophen (NORCO/VICODIN) 5-325 MG per tablet 1 tablet  1 tablet Oral Q6H PRN Opyd, Ilene Qua, MD      . insulin aspart (novoLOG) injection 0-9 Units  0-9 Units Subcutaneous Q4H Opyd, Ilene Qua, MD      . labetalol (NORMODYNE) injection 10 mg  10 mg Intravenous Q2H PRN Opyd, Ilene Qua, MD      . ondansetron (ZOFRAN) tablet 4 mg  4 mg Oral Q6H PRN Opyd, Ilene Qua, MD       Or  . ondansetron (ZOFRAN) injection 4 mg  4 mg Intravenous Q6H PRN Opyd, Ilene Qua, MD      . polyethylene glycol (MIRALAX / GLYCOLAX) packet 17 g  17 g Oral Daily PRN Opyd, Ilene Qua, MD      . sodium chloride flush (NS) 0.9 % injection 3 mL  3 mL Intravenous Q12H Opyd, Ilene Qua, MD   3 mL at 01/16/20 0207    REVIEW OF SYSTEMS:   [X]  denotes positive finding, [ ]  denotes negative finding Cardiac  Comments:  Chest pain or chest pressure:    Shortness of breath upon exertion:    Short of breath when lying flat:    Irregular heart rhythm:        Vascular    Pain in calf, thigh, or hip brought on by ambulation:    Pain in feet at night that wakes you up from your sleep:     Blood clot in your veins:    Leg swelling:         Pulmonary    Oxygen at home:    Productive cough:     Wheezing:         Neurologic    Sudden weakness in arms or legs:  Sudden numbness in arms or legs:     Sudden onset of difficulty speaking or slurred  speech:    Temporary loss of vision in one eye:     Problems with dizziness:         Gastrointestinal    Blood in stool:      Vomited blood:         Genitourinary    Burning when urinating:     Blood in urine:        Psychiatric    Major depression:         Hematologic    Bleeding problems:    Problems with blood clotting too easily:        Skin    Rashes or ulcers:        Constitutional    Fever or chills:     PHYSICAL EXAM:   Vitals:   01/16/20 0114 01/16/20 0155 01/16/20 0449  BP: (!) 178/66 (!) 183/57 (!) 176/67  Pulse: 63  73  Resp: 18 17 16   Temp: 97.8 F (36.6 C)  97.6 F (36.4 C)  TempSrc: Oral  Oral  SpO2: 98%  98%  Weight: 78.8 kg    Height: 5' (1.524 m)      GENERAL: The patient is a well-nourished female, in no acute distress. The vital signs are documented above. CARDIAC: There is a regular rate and rhythm.  PULMONARY: Nonlabored respirations ABDOMEN: Soft and non-tender with normal pitched bowel sounds.  MUSCULOSKELETAL: There are no major deformities or cyanosis. NEUROLOGIC: No focal weakness or paresthesias are detected. SKIN: There are no ulcers or rashes noted. PSYCHIATRIC: The patient has a normal affect.  STUDIES:   I have reviewed the following CTA:  1. Focal calcified plaque within the proximal right subclavian artery. Stenosis at this site may exceed 50%. 2. The common and internal carotid arteries are patent within the neck bilaterally. Plaque within the bilateral carotid systems as described. 3. Less than 50% stenosis of the distal right CCA. Stenosis within the proximal right ICA appears to approach 70%, although exact quantification of stenosis at this site is difficult due to blooming from calcified plaque. 4. Estimated stenosis within the proximal left ICA of 70% or greater, although exact quantification of stenosis is limited by blooming from calcified plaque. 5. The bilateral vertebral arteries are patent within the  neck without stenosis. 6. Correlate with findings on same-day carotid artery duplex 01/14/2020.  CTA head:  1. Calcified plaque within the vertebral arteries at the level of the skull base with resultant at least moderate bilateral stenosis. 2. Otherwise, no high-grade intracranial proximal arterial stenosis is demonstrated. 3. Calcified plaque within the intracranial internal carotid arteries with no more than mild luminal narrowing.    ASSESSMENT and PLAN   Symptomatic carotid stenosis: Patient describes what sounds like a TIA.  CT scan showed a ulcerated lesion on the left side which is most likely the culprit.  We discussed proceeding with intervention.  Because of the lesion location, I feel she would be best served with TCAR.  She will need to be started on Plavix in addition to aspirin and a statin.  I discussed the details of the procedure including the risk of bleeding as well as the risk of stroke.  She should also be evaluated by the stroke team while she is in the hospital.  She will be n.p.o. after midnight.   Leia Alf, MD, FACS [facility] [phone]  Pager [phone]

## 2020-01-16 NOTE — H&P (Addendum)
History and Physical    [name] [id number] DOB: [birth year] DOA: 01/16/2020  PCP: Serita Grammes, MD   Patient coming from: Home   Chief Complaint: Weakness, fatigue, slurred speech   HPI: [name] is a 74 y.o. female with medical history significant for type 2 diabetes mellitus, hypertension, asthma, hyperlipidemia, and OSA who presented to [facility] on 01/13/2020 with generalized weakness, slurred speech, and gait difficulty after her husband had had difficulty waking her up that morning.  She had not been experiencing any headache, change in vision or hearing, or numbness.  There had not been any recent fall or trauma and she had not experienced these symptoms previously.  She denied any chest pain or palpitations associated with this and denies cough, shortness of breath, fevers, or chills.  She initially sought evaluation with her PCP who directed her to the emergency department.  [facility] ED and Hospital Course: Upon arrival to the ED, she reports feeling as though she had returned to her baseline.  She underwent noncontrast head CT that was negative, MRI brain that was felt to be normal for age, was admitted to the hospitalist service, and underwent carotid imaging with both ultrasound and CTA.  CTA neck demonstrated bilateral carotid artery stenosis, patient was continued on her statin and aspirin, Plavix was added, and she was transferred to [facility] for consideration of revascularization.  Review of Systems:  All other systems reviewed and apart from HPI, are negative.  Past Medical History:  Diagnosis Date  . Allergy   . Anxiety   . Arthritis   . Asthma   . Cataract    bilateral and removed  . Depression   . Diaphragmatic hernia without mention of obstruction or gangrene   . DM (diabetes mellitus) ([place])   . Dyspnea   . Esophageal reflux   . Esophagitis, unspecified   . H/O adenomatous polyp of colon 2011  . Heart murmur    no  cardiologist, family Dr. Maryfrances Bunnell  . History of kidney stones   . Hyperlipidemia   . Incontinence of feces   . Irritable bowel syndrome   . Kidney stones   . OSA (obstructive sleep apnea) 09/28/2018  . Sarcoidosis   . Sleep apnea    no cpap now  . Status post dilation of esophageal narrowing   . Unspecified asthma(493.90)   . Unspecified essential hypertension   . Unspecified hearing loss     Past Surgical History:  Procedure Laterality Date  . BLADDER SURGERY    . CATARACT EXTRACTION Bilateral   . HARDWARE REMOVAL Left 01/12/2017   Procedure: HARDWARE REMOVAL left wrist;  Surgeon: Leanora Cover, MD;  Location: [place];  Service: Orthopedics;  Laterality: Left;  . HEMORRHOID SURGERY    . NASAL SEPTUM SURGERY    . PARTIAL HYSTERECTOMY    . REVERSE SHOULDER ARTHROPLASTY Left 02/22/2018  . REVERSE SHOULDER ARTHROPLASTY Left 02/22/2018   Procedure: LEFT REVERSE SHOULDER ARTHROPLASTY;  Surgeon: Meredith Pel, MD;  Location: [place];  Service: Orthopedics;  Laterality: Left;  . TONSILLECTOMY AND ADENOIDECTOMY    . TRIGGER FINGER RELEASE Left 05/12/2016   Procedure: RELEASE TRIGGER FINGER/A-1 PULLEY INFECTION LEFT RING TENDON SHEATH INJECT RIGHT INDEX FINGER;  Surgeon: Leanora Cover, MD;  Location: [place];  Service: Orthopedics;  Laterality: Left;  . TUBAL LIGATION       reports that she has never smoked. She has never used smokeless tobacco. She reports that she does  not drink alcohol or use drugs.  Allergies  Allergen Reactions  . Codeine Nausea And Vomiting  . Sulfa Antibiotics Other (See Comments)    Allergic per pt's mother     Family History  Problem Relation Age of Onset  . Esophageal cancer Father   . Diabetes Mother   . Diabetes Maternal Grandmother   . Colon cancer Neg Hx   . Rectal cancer Neg Hx   . Stomach cancer Neg Hx      Prior to Admission medications   Medication Sig Start Date End Date Taking? Authorizing Provider    albuterol (PROVENTIL HFA;VENTOLIN HFA) 108 (90 Base) MCG/ACT inhaler INHALE 2 PUFFS BY MOUTH 4 TIMES A DAY AS NEEDED 08/23/18   Kozlow, Donnamarie Poag, MD  albuterol (PROVENTIL) (2.5 MG/3ML) 0.083% nebulizer solution Take 3 mLs (2.5 mg total) by nebulization every 4 (four) hours as needed for wheezing or shortness of breath. 05/04/18   Kozlow, Donnamarie Poag, MD  amLODipine (NORVASC) 5 MG tablet Take 5 mg by mouth daily. 06/21/18   [provider]  aspirin EC 81 MG tablet Take 81 mg by mouth daily.    [provider]  atorvastatin (LIPITOR) 20 MG tablet Take 20 mg by mouth every evening.     [provider]  beclomethasone (QVAR REDIHALER) 80 MCG/ACT inhaler Inhale two doses twice daily during flare-up.  Rinse, gargle, and spit after use. Patient not taking: Reported on 01/09/2020 05/11/17   Jiles Prows, MD  budesonide-formoterol [facility]) 160-4.5 MCG/ACT inhaler Inhale 2 puffs into the lungs 2 (two) times daily.    [provider]  cholecalciferol (VITAMIN D) 1000 units tablet Take 1,000 Units by mouth daily.    [provider]  citalopram (CELEXA) 20 MG tablet Take 20 mg by mouth daily.      [provider]  Cyanocobalamin (VITAMIN B-12) 5000 MCG LOZG Take 5,000 mcg by mouth daily.    [provider]  dicyclomine (BENTYL) 10 MG capsule  07/12/19   [provider]  EPINEPHrine 0.3 mg/0.3 mL IJ SOAJ injection USE AS DIRECTED FOR LIFE THREATENING ALLERGIC REACTION 10/26/15   [provider]  famotidine (PEPCID) 40 MG tablet  06/29/19   [provider]  fluticasone (FLONASE) 50 MCG/ACT nasal spray USE ONE SPRAY IN EACH NOSTRIL ONCE OR TWICE DAILY 01/25/19   Kozlow, Donnamarie Poag, MD  furosemide (LASIX) 40 MG tablet  07/22/19   [provider]  ipratropium (ATROVENT) 0.06 % nasal spray Can use one to two sprays in each nostril every six hours as needed to dry up runny nose. 11/11/19   Kozlow, Donnamarie Poag, MD  levocetirizine (XYZAL) 5 MG  tablet TAKE 1 TABLET BY MOUTH EVERYDAY AT BEDTIME 01/31/19   [provider]  liraglutide (VICTOZA) 18 MG/3ML SOPN Inject 0.6 mg into the skin daily.     [provider]  mometasone-formoterol (DULERA) 200-5 MCG/ACT AERO Inhale two puffs using spacer twice daily to prevent cough or wheeze.  Rinse, gargle, and spit after use. Patient not taking: Reported on 01/09/2020 11/11/19   Jiles Prows, MD  montelukast (SINGULAIR) 10 MG tablet Take 10 mg by mouth daily. 02/20/19   [provider]  omalizumab Arvid Right) 150 MG injection Inject 150 mg into the skin every 28 (twenty-eight) days.  08/25/15   [provider]  omeprazole (PRILOSEC) 40 MG capsule TAKE 1 CAPSULE BY MOUTH TWICE A DAY 07/01/19   Kozlow, Donnamarie Poag, MD  oxybutynin (DITROPAN XL) 15 MG  24 hr tablet  07/23/19   [provider]  pioglitazone (ACTOS) 30 MG tablet Take 30 mg by mouth daily.    [provider]  Probiotic Product (PROBIOTIC DAILY PO) Take 1 capsule by mouth at bedtime.     [provider]  telmisartan (MICARDIS) 80 MG tablet Take 80 mg by mouth daily. 02/20/19   [provider]  Tetrahydrozoline HCl (VISINE OP) Place 1 drop into both eyes daily as needed (dry eyes).    [provider]    Physical Exam: Vitals:   01/16/20 0114  BP: (!) 178/66  Pulse: 63  Resp: 18  Temp: 97.8 F (36.6 C)  TempSrc: Oral  SpO2: 98%  Weight: 78.8 kg  Height: 5' (1.524 m)    Constitutional: NAD, calm  Eyes: PERTLA, lids and conjunctivae normal ENMT: Mucous membranes are moist. Posterior pharynx clear of any exudate or lesions.   Neck: normal, supple, no masses, no thyromegaly Respiratory: no wheezing, no crackles. No accessory muscle use.  Cardiovascular: S1 & S2 heard, regular rate and rhythm. Trace ankle edema bilaterally. Abdomen: No distension, no tenderness, soft. Bowel sounds active.  Musculoskeletal: no clubbing / cyanosis. No joint deformity upper and lower  extremities.   Skin: no significant rashes, lesions, ulcers. Warm, dry, well-perfused. Neurologic: No gross facial asymmetry, gross hearing deficit, no dysarthria. Sensation intact. Strength 5/5 in all 4 limbs.  Psychiatric: Alert and oriented to person, place, and situation. Calm and cooperative.    Labs and Imaging on Admission: I have personally reviewed following labs and imaging studies  CBC: No results for input(s): WBC, NEUTROABS, HGB, HCT, MCV, PLT in the last 168 hours. Basic Metabolic Panel: No results for input(s): NA, K, CL, CO2, GLUCOSE, BUN, CREATININE, CALCIUM, MG, PHOS in the last 168 hours. GFR: CrCl cannot be calculated (Patient's most recent lab result is older than the maximum 21 days allowed.). Liver Function Tests: No results for input(s): AST, ALT, ALKPHOS, BILITOT, PROT, ALBUMIN in the last 168 hours. No results for input(s): LIPASE, AMYLASE in the last 168 hours. No results for input(s): AMMONIA in the last 168 hours. Coagulation Profile: No results for input(s): INR, PROTIME in the last 168 hours. Cardiac Enzymes: No results for input(s): CKTOTAL, CKMB, CKMBINDEX, TROPONINI in the last 168 hours. BNP (last 3 results) No results for input(s): PROBNP in the last 8760 hours. HbA1C: No results for input(s): HGBA1C in the last 72 hours. CBG: Recent Labs  Lab 01/16/20 0122  GLUCAP 103*   Lipid Profile: No results for input(s): CHOL, HDL, LDLCALC, TRIG, CHOLHDL, LDLDIRECT in the last 72 hours. Thyroid Function Tests: No results for input(s): TSH, T4TOTAL, FREET4, T3FREE, THYROIDAB in the last 72 hours. Anemia Panel: No results for input(s): VITAMINB12, FOLATE, FERRITIN, TIBC, IRON, RETICCTPCT in the last 72 hours. Urine analysis:    Component Value Date/Time   COLORURINE YELLOW 02/16/2018 1055   APPEARANCEUR HAZY (A) 02/16/2018 1055   LABSPEC 1.028 02/16/2018 1055   PHURINE 5.0 02/16/2018 1055   GLUCOSEU NEGATIVE 02/16/2018 1055   HGBUR MODERATE (A)  02/16/2018 1055   BILIRUBINUR SMALL (A) 02/16/2018 1055   KETONESUR NEGATIVE 02/16/2018 1055   PROTEINUR 30 (A) 02/16/2018 1055   UROBILINOGEN 0.2 08/02/2007 1303   NITRITE NEGATIVE 02/16/2018 1055   LEUKOCYTESUR SMALL (A) 02/16/2018 1055   Sepsis Labs: @LABRCNTIP (procalcitonin:4,lacticidven:4) )No results found for this or any previous visit (from the past 240 hour(s)).   Radiological Exams on Admission: No results found.   Assessment/Plan  1. Bilateral carotid artery stenosis  - Patient was admitted to [facility] with general weakness, slurred speech, and gait difficulty that resolved by time of admission and she had no acute findings on MRI brain but bilateral carotid artery stenosis was noted and so she was transferred to [facility] for consideration of revascularization  - CTA neck at [facility] demonstrates proximal right internal carotid artery stenosis approaching 70% and proximal left ICA stenosis estimated at 70% or greater   - She had been on statin and ASA prior to admission to [place], BP appears to have been well-controlled, and DM was well controlled (A1c 5.9% there)  - Plavix was added prior to transfer  - Continue statin, Plavix, and ASA, continue glycemic control and antihypertensives, consult with vascular surgery    2. Type II DM  - A1c was only 5.9% at [facility]  - Medication reconciliation is pending, will use SSI with Novolog for now    3. Hypertension  - BP at goal  - Treat as needed for now pending pharmacy medication reconciliation    4. Anemia  - Hgb was 9.5 on 2/17 at [place], appears stable   5. Asthma  - No cough or wheeze on admission  - Continue as-needed albuterol    DVT prophylaxis: SCD's  Code Status: Full  Family Communication: Discussed with patient  Disposition Plan: From home, will likely return home pending vascular surgery consultation and possible surgery this admission  Consults called: None  Admission status: inpatient     Vianne Bulls, MD [facility] Pager: See [email or web address]  If 7AM-7PM, please contact the daytime attending [email or web address]  01/16/2020, 2:49 AM

## 2020-01-16 NOTE — Consult Note (Signed)
[facility] [place]  Patient name: [name] MRN: [id number] DOB: 11-17-1946 Sex: female   REQUESTING PROVIDER:    Hospitalists   REASON FOR CONSULT:    Carotid stenosis  HISTORY OF PRESENT ILLNESS:   [name] is a 74 y.o. female, who presented to [facility] on 01/13/2020 with weakness fatigue and slurred speech.  She had a CT scan which showed 70% bilateral carotid stenosis.  She denies any other symptoms such as amaurosis fugax or numbness or weakness in either extremity.  She has no residual deficits and feels that she is back to her baseline  Patient is medically managed for type 2 diabetes.  She takes a statin for hypercholesterolemia.  She is medically managed for hypertension.  She is a non-smoker.  PAST MEDICAL HISTORY    Past Medical History:  Diagnosis Date  . Allergy   . Anxiety   . Arthritis   . Asthma   . Cataract    bilateral and removed  . Depression   . Diaphragmatic hernia without mention of obstruction or gangrene   . DM (diabetes mellitus) ([place])   . Dyspnea   . Esophageal reflux   . Esophagitis, unspecified   . H/O adenomatous polyp of colon 2011  . Heart murmur    no cardiologist, family Dr. Maryfrances Bunnell  . History of kidney stones   . Hyperlipidemia   . Incontinence of feces   . Irritable bowel syndrome   . Kidney stones   . OSA (obstructive sleep apnea) 09/28/2018  . Sarcoidosis   . Sleep apnea    no cpap now  . Status post dilation of esophageal narrowing   . Unspecified asthma(493.90)   . Unspecified essential hypertension   . Unspecified hearing loss      FAMILY HISTORY   Family History  Problem Relation Age of Onset  . Esophageal cancer Father   . Diabetes Mother   . Diabetes Maternal Grandmother   . Colon cancer Neg Hx   . Rectal cancer Neg Hx   . Stomach cancer Neg Hx     SOCIAL HISTORY:   Social History   Socioeconomic History  . Marital  status: Married    Spouse name: Not on file  . Number of children: 2  . Years of education: Not on file  . Highest education level: Not on file  Occupational History  . Occupation: retired  Tobacco Use  . Smoking status: Never Smoker  . Smokeless tobacco: Never Used  Substance and Sexual Activity  . Alcohol use: No  . Drug use: No  . Sexual activity: Not on file  Other Topics Concern  . Not on file  Social History Narrative   Daily caffeine use.    Social Determinants of Health   Financial Resource Strain:   . Difficulty of Paying Living Expenses: Not on file  Food Insecurity:   . Worried About Charity fundraiser in the Last Year: Not on file  . Ran Out of Food in the Last Year: Not on file  Transportation Needs:   . Lack of Transportation (Medical): Not on file  . Lack of Transportation (Non-Medical): Not on file  Physical Activity:   . Days of Exercise per Week: Not on file  . Minutes of Exercise per Session: Not on file  Stress:   . Feeling of Stress : Not on file  Social Connections:   . Frequency of Communication with Friends and Family: Not on file  .  Frequency of Social Gatherings with Friends and Family: Not on file  . Attends Religious Services: Not on file  . Active Member of Clubs or Organizations: Not on file  . Attends Archivist Meetings: Not on file  . Marital Status: Not on file  Intimate Partner Violence:   . Fear of Current or Ex-Partner: Not on file  . Emotionally Abused: Not on file  . Physically Abused: Not on file  . Sexually Abused: Not on file    ALLERGIES:    Allergies  Allergen Reactions  . Codeine Nausea And Vomiting  . Sulfa Antibiotics Other (See Comments)    Allergic per pt's mother     CURRENT MEDICATIONS:    Current Facility-Administered Medications  Medication Dose Route Frequency Provider Last Rate Last Admin  . 0.9 %  sodium chloride infusion   Intravenous Continuous Opyd, Ilene Qua, MD 75 mL/hr at 01/16/20  0700 Rate Verify at 01/16/20 0700  . acetaminophen (TYLENOL) tablet 650 mg  650 mg Oral Q6H PRN Opyd, Ilene Qua, MD       Or  . acetaminophen (TYLENOL) suppository 650 mg  650 mg Rectal Q6H PRN Opyd, Ilene Qua, MD      . albuterol (PROVENTIL) (2.5 MG/3ML) 0.083% nebulizer solution 2.5 mg  2.5 mg Inhalation Q4H PRN Opyd, Ilene Qua, MD      . aspirin EC tablet 81 mg  81 mg Oral Daily Opyd, Ilene Qua, MD      . atorvastatin (LIPITOR) tablet 40 mg  40 mg Oral q1800 Opyd, Ilene Qua, MD      . clopidogrel (PLAVIX) tablet 75 mg  75 mg Oral Daily Opyd, Ilene Qua, MD      . HYDROcodone-acetaminophen (NORCO/VICODIN) 5-325 MG per tablet 1 tablet  1 tablet Oral Q6H PRN Opyd, Ilene Qua, MD      . insulin aspart (novoLOG) injection 0-9 Units  0-9 Units Subcutaneous Q4H Opyd, Ilene Qua, MD      . labetalol (NORMODYNE) injection 10 mg  10 mg Intravenous Q2H PRN Opyd, Ilene Qua, MD      . ondansetron (ZOFRAN) tablet 4 mg  4 mg Oral Q6H PRN Opyd, Ilene Qua, MD       Or  . ondansetron (ZOFRAN) injection 4 mg  4 mg Intravenous Q6H PRN Opyd, Ilene Qua, MD      . polyethylene glycol (MIRALAX / GLYCOLAX) packet 17 g  17 g Oral Daily PRN Opyd, Ilene Qua, MD      . sodium chloride flush (NS) 0.9 % injection 3 mL  3 mL Intravenous Q12H Opyd, Ilene Qua, MD   3 mL at 01/16/20 0207    REVIEW OF SYSTEMS:   [X]  denotes positive finding, [ ]  denotes negative finding Cardiac  Comments:  Chest pain or chest pressure:    Shortness of breath upon exertion:    Short of breath when lying flat:    Irregular heart rhythm:        Vascular    Pain in calf, thigh, or hip brought on by ambulation:    Pain in feet at night that wakes you up from your sleep:     Blood clot in your veins:    Leg swelling:         Pulmonary    Oxygen at home:    Productive cough:     Wheezing:         Neurologic    Sudden weakness in arms or legs:  Sudden numbness in arms or legs:     Sudden onset of difficulty speaking or slurred  speech:    Temporary loss of vision in one eye:     Problems with dizziness:         Gastrointestinal    Blood in stool:      Vomited blood:         Genitourinary    Burning when urinating:     Blood in urine:        Psychiatric    Major depression:         Hematologic    Bleeding problems:    Problems with blood clotting too easily:        Skin    Rashes or ulcers:        Constitutional    Fever or chills:     PHYSICAL EXAM:   Vitals:   01/16/20 0114 01/16/20 0155 01/16/20 0449  BP: (!) 178/66 (!) 183/57 (!) 176/67  Pulse: 63  73  Resp: 18 17 16   Temp: 97.8 F (36.6 C)  97.6 F (36.4 C)  TempSrc: Oral  Oral  SpO2: 98%  98%  Weight: 78.8 kg    Height: 5' (1.524 m)      GENERAL: The patient is a well-nourished female, in no acute distress. The vital signs are documented above. CARDIAC: There is a regular rate and rhythm.  PULMONARY: Nonlabored respirations ABDOMEN: Soft and non-tender with normal pitched bowel sounds.  MUSCULOSKELETAL: There are no major deformities or cyanosis. NEUROLOGIC: No focal weakness or paresthesias are detected. SKIN: There are no ulcers or rashes noted. PSYCHIATRIC: The patient has a normal affect.  STUDIES:   I have reviewed the following CTA:  1. Focal calcified plaque within the proximal right subclavian artery. Stenosis at this site may exceed 50%. 2. The common and internal carotid arteries are patent within the neck bilaterally. Plaque within the bilateral carotid systems as described. 3. Less than 50% stenosis of the distal right CCA. Stenosis within the proximal right ICA appears to approach 70%, although exact quantification of stenosis at this site is difficult due to blooming from calcified plaque. 4. Estimated stenosis within the proximal left ICA of 70% or greater, although exact quantification of stenosis is limited by blooming from calcified plaque. 5. The bilateral vertebral arteries are patent within the  neck without stenosis. 6. Correlate with findings on same-day carotid artery duplex 01/14/2020.  CTA head:  1. Calcified plaque within the vertebral arteries at the level of the skull base with resultant at least moderate bilateral stenosis. 2. Otherwise, no high-grade intracranial proximal arterial stenosis is demonstrated. 3. Calcified plaque within the intracranial internal carotid arteries with no more than mild luminal narrowing.    ASSESSMENT and PLAN   Symptomatic carotid stenosis: Patient describes what sounds like a TIA.  CT scan showed a ulcerated lesion on the left side which is most likely the culprit.  We discussed proceeding with intervention.  Because of the lesion location, I feel she would be best served with TCAR.  She will need to be started on Plavix in addition to aspirin and a statin.  I discussed the details of the procedure including the risk of bleeding as well as the risk of stroke.  She should also be evaluated by the stroke team while she is in the hospital.  She will be n.p.o. after midnight.   Leia Alf, MD, FACS [facility] [phone]  Pager [phone]

## 2020-01-16 NOTE — Consult Note (Addendum)
Neurology Consultation  Reason for Consult: TIA Referring Physician: Dr. Broadus John  CC: Lethargy and transient word finding  History is obtained from: Patient and husband  HPI: [name] is a 74 y.o. female with pertinent history of essential hypertension, sleep apnea, hyperlipidemia, sarcoidosis, diabetes.  Due to patient experiencing transient dysarthria/word finding difficulties in the setting of significant left internal carotid artery stenosis neurology was asked to see patient.  Patient states that on Monday morning husband could not wake his wife at 65.  This is very abnormal, because usually the wife would wake up fairly early in the morning.  Patient actually slept until 1045 that morning but still states that she remained extremely lethargic and sleepy.  The husband was worried about his wife and wanted to bring her to the hospital however she refused.  Patient was laying on the couch at approximately 1500 hrs. when her husband noted that she was having dysarthria and also word finding difficulties that lasted for approximately 2 hours.  At that point she still remained extremely sleepy thus he brought her to [facility].  While at [facility].  While at [facility] patient had a CT of head in addition to a CTA of head and neck.  Patient was at that point transferred to [facility].  Vascular surgery reviewed the CTA.  They noted focal calcified plaque within the proximal right subclavian artery.  Stenosis at this site might exceed 50%.  Patient also showed right ICA stenosis which appeared to be 70 percent in addition to left ICA stenosis which was again 70% or greater.  It was discussed with patient to have a transcarotid artery resection as this would be asymptomatic internal carotid artery stenosis.  Vascular surgery at that point asked hospitalist to have neurology involved.  ED course   CTA neck- noted focal calcified plaque within the proximal right  subclavian artery.  Stenosis at this site might exceed 50%.  Patient also showed right ICA stenosis which appeared to be 70 percent in addition to left ICA stenosis which was again 70% or greater.  CTA head-showed no high-grade intracranial proximal artery stenosis.  Calcified plaque within the vertebral arteries at the level of the skull base with resultant at least moderate bilateral stenosis  LKW: 1500 hrs. on 01/13/2020 tpa given?: no, out of window and symptoms resolved Premorbid modified Rankin scale (mRS): 0 NIH stroke scale of 0   Past Medical History:  Diagnosis Date  . Allergy   . Anxiety   . Arthritis   . Asthma   . Cataract    bilateral and removed  . Depression   . Diaphragmatic hernia without mention of obstruction or gangrene   . DM (diabetes mellitus) ([place])   . Dyspnea   . Esophageal reflux   . Esophagitis, unspecified   . H/O adenomatous polyp of colon 2011  . Heart murmur    no cardiologist, family Dr. Maryfrances Bunnell  . History of kidney stones   . Hyperlipidemia   . Incontinence of feces   . Irritable bowel syndrome   . Kidney stones   . OSA (obstructive sleep apnea) 09/28/2018  . Sarcoidosis   . Sleep apnea    no cpap now  . Status post dilation of esophageal narrowing   . Unspecified asthma(493.90)   . Unspecified essential hypertension   . Unspecified hearing loss     Family History  Problem Relation Age of Onset  . Esophageal cancer Father   . Diabetes Mother   .  Diabetes Maternal Grandmother   . Colon cancer Neg Hx   . Rectal cancer Neg Hx   . Stomach cancer Neg Hx    Social History:   reports that she has never smoked. She has never used smokeless tobacco. She reports that she does not drink alcohol or use drugs.  Medications  Current Facility-Administered Medications:  .  0.9 %  sodium chloride infusion, , Intravenous, Continuous, Opyd, Ilene Qua, MD, Last Rate: 75 mL/hr at 01/16/20 0700, Rate Verify at 01/16/20 0700 .  acetaminophen  (TYLENOL) tablet 650 mg, 650 mg, Oral, Q6H PRN **OR** acetaminophen (TYLENOL) suppository 650 mg, 650 mg, Rectal, Q6H PRN, Opyd, Timothy S, MD .  albuterol (PROVENTIL) (2.5 MG/3ML) 0.083% nebulizer solution 2.5 mg, 2.5 mg, Inhalation, Q4H PRN, Opyd, Timothy S, MD .  aspirin EC tablet 81 mg, 81 mg, Oral, Daily, Opyd, Ilene Qua, MD, 81 mg at 01/16/20 0834 .  atorvastatin (LIPITOR) tablet 40 mg, 40 mg, Oral, q1800, Opyd, Timothy S, MD .  clopidogrel (PLAVIX) tablet 75 mg, 75 mg, Oral, Daily, Opyd, Ilene Qua, MD, 75 mg at 01/16/20 0835 .  HYDROcodone-acetaminophen (NORCO/VICODIN) 5-325 MG per tablet 1 tablet, 1 tablet, Oral, Q6H PRN, Opyd, Timothy S, MD .  insulin aspart (novoLOG) injection 0-9 Units, 0-9 Units, Subcutaneous, Q4H, Opyd, Ilene Qua, MD, 1 Units at 01/16/20 1309 .  labetalol (NORMODYNE) injection 10 mg, 10 mg, Intravenous, Q2H PRN, Opyd, Timothy S, MD .  ondansetron (ZOFRAN) tablet 4 mg, 4 mg, Oral, Q6H PRN **OR** ondansetron (ZOFRAN) injection 4 mg, 4 mg, Intravenous, Q6H PRN, Opyd, Ilene Qua, MD .  polyethylene glycol (MIRALAX / GLYCOLAX) packet 17 g, 17 g, Oral, Daily PRN, Opyd, Timothy S, MD .  sodium chloride flush (NS) 0.9 % injection 3 mL, 3 mL, Intravenous, Q12H, Opyd, Ilene Qua, MD, 3 mL at 01/16/20 0840  ROS:     General ROS: negative for - chills, fatigue, fever, night sweats, weight gain or weight loss Psychological ROS: negative for - behavioral disorder, hallucinations, memory difficulties, mood swings or suicidal ideation Ophthalmic ROS: negative for - blurry vision, double vision, eye pain or loss of vision ENT ROS: Positive for -presbycusis Allergy and Immunology ROS: negative for - hives or itchy/watery eyes Hematological and Lymphatic ROS: negative for - bleeding problems, bruising or swollen lymph nodes Endocrine ROS: negative for - galactorrhea, hair pattern changes, polydipsia/polyuria or temperature intolerance Respiratory ROS: negative for - cough, hemoptysis,  shortness of breath or wheezing Cardiovascular ROS: negative for - chest pain, dyspnea on exertion, edema or irregular heartbeat Gastrointestinal ROS: negative for - abdominal pain, diarrhea, hematemesis, nausea/vomiting or stool incontinence Genito-Urinary ROS: negative for - dysuria, hematuria, incontinence or urinary frequency/urgency Musculoskeletal ROS: negative for - joint swelling or muscular weakness Neurological ROS: as noted in HPI Dermatological ROS: negative for rash and skin lesion changes  Exam: Current vital signs: BP (!) 168/64 (BP Location: Left Arm)   Pulse 69   Temp 98 F (36.7 C) (Oral)   Resp 15   Ht 5' (1.524 m)   Wt 78.8 kg   SpO2 97%   BMI 33.93 kg/m  Vital signs in last 24 hours: Temp:  [97.6 F (36.4 C)-98 F (36.7 C)] 98 F (36.7 C) (02/18 1243) Pulse Rate:  [55-73] 69 (02/18 1243) Resp:  [15-18] 15 (02/18 1243) BP: (168-183)/(55-67) 168/64 (02/18 1243) SpO2:  [96 %-98 %] 97 % (02/18 0852) Weight:  [78.8 kg] 78.8 kg (02/18 0114)   Constitutional: Appears well-developed and well-nourished.  Psych: Affect appropriate to situation Eyes: No scleral injection HENT: No OP obstrucion Head: Normocephalic.  Cardiovascular: Normal rate and regular rhythm.  Respiratory: Effort normal, non-labored breathing GI: Soft.  No distension. There is no tenderness.  Skin: WDI  Neuro: Mental Status: Patient is awake, alert, oriented to person, place, month, year, and situation. Speech-intact naming, repeating, comprehension Patient is able to give a clear and coherent history. Cranial Nerves: II: Visual Fields are full.  III,IV, VI: EOMI without ptosis or diploplia. Pupils equal, round and reactive to light V: Facial sensation is symmetric to temperature VII: Facial movement is symmetric.  VIII: hearing decreased X: Palat elevates symmetrically XI: Shoulder shrug is symmetric. XII: tongue is midline without atrophy or fasciculations.  Motor: Tone is  normal. Bulk is normal. 5/5 strength was present in all four extremities.  No drift Sensory: Sensation is symmetric to light touch and temperature in the arms and legs. Deep Tendon Reflexes: 2+ and symmetric in the patella with cross adductors.  2+ and brisk at the brachioradialis.  Positive Hoffmann's Plantars: Toes are downgoing bilaterally.  Cerebellar: FNF and HKS are intact bilaterally  Labs I have reviewed labs in epic and the results pertinent to this consultation are:   CBC    Component Value Date/Time   WBC 6.4 01/16/2020 0316   RBC 3.48 (L) 01/16/2020 0316   HGB 9.7 (L) 01/16/2020 0316   HGB 9.9 (L) 01/16/2019 1529   HCT 31.1 (L) 01/16/2020 0316   HCT 32.5 (L) 01/16/2019 1529   PLT 206 01/16/2020 0316   PLT 298 01/16/2019 1529   MCV 89.4 01/16/2020 0316   MCV 85 01/16/2019 1529   MCH 27.9 01/16/2020 0316   MCHC 31.2 01/16/2020 0316   RDW 17.2 (H) 01/16/2020 0316   RDW 14.4 01/16/2019 1529   LYMPHSABS 0.8 01/16/2019 1529   EOSABS 0.1 01/16/2019 1529   BASOSABS 0.1 01/16/2019 1529    CMP     Component Value Date/Time   NA 144 01/16/2020 0316   K 4.4 01/16/2020 0316   CL 112 (H) 01/16/2020 0316   CO2 25 01/16/2020 0316   GLUCOSE 124 (H) 01/16/2020 0316   BUN 17 01/16/2020 0316   CREATININE 0.87 01/16/2020 0316   CALCIUM 8.8 (L) 01/16/2020 0316   PROT 5.8 (L) 01/16/2020 0316   ALBUMIN 3.3 (L) 01/16/2020 0316   AST 20 01/16/2020 0316   ALT 11 01/16/2020 0316   ALKPHOS 54 01/16/2020 0316   BILITOT 0.5 01/16/2020 0316   GFRNONAA >60 01/16/2020 0316   GFRAA >60 01/16/2020 0316    Imaging I have reviewed the images obtained:  CTA neck- noted focal calcified plaque within the proximal right subclavian artery.  Stenosis at this site might exceed 50%.  Patient also showed right ICA stenosis which appeared to be 70 percent in addition to left ICA stenosis which was again 70% or greater.  CTA head-showed no high-grade intracranial proximal artery stenosis.   Calcified plaque within the vertebral arteries at the level of the skull base with resultant at least moderate bilateral stenosis  Etta Quill PA-C [facility] [phone]  M-F  (9:00 am- 5:00 PM)  01/16/2020, 1:38 PM   I have seen the patient reviewed the above note.  Her description of the event includes both slurred speech as well as word finding difficulty.  Assessment:  74 year old female presented to [facility] with significant lethargy and 2-hour period of word finding difficulties and slurred speech.  CTA of neck revealed 70% stenosis  of bilateral internal carotid arteries.  With the symptoms and the finding of the ulcerated plaque in the left carotid, though there were no associated symptoms of numbness or weakness, I do not think we can discount a strong possibility that this did represent a true transient ischemic attack.  I would favor considering this a symptomatic carotid.  She has already had an appropriate stroke workup at [facility], no need for further evaluation at this time.  Impression:  -Symptomatic carotid artery stenosis causing dysarthria and word finding difficulties  Recommend  -Appreciate vascular surgery consultation. -- neurology will be available as needed.  She can follow-up with outpatient neurology.  Roland Rack, MD [facility] [phone]  If 7pm- 7am, please page neurology on call as listed in [place].

## 2020-01-17 ENCOUNTER — Encounter (HOSPITAL_COMMUNITY): Payer: Self-pay | Admitting: Family Medicine

## 2020-01-17 ENCOUNTER — Inpatient Hospital Stay (HOSPITAL_COMMUNITY): Payer: Medicare Other

## 2020-01-17 ENCOUNTER — Encounter (HOSPITAL_COMMUNITY)
Admission: AD | Disposition: A | Discharge status: Home / Self Care | Payer: Self-pay | Source: Other Acute Inpatient Hospital | Attending: Internal Medicine

## 2020-01-17 HISTORY — PX: TRANSCAROTID ARTERY REVASCULARIZATIONÂ: SHX6778

## 2020-01-17 LAB — BASIC METABOLIC PANEL
Anion gap: 9 (ref 5–15)
BUN: 20 mg/dL (ref 8–23)
CO2: 22 mmol/L (ref 22–32)
Calcium: 9 mg/dL (ref 8.9–10.3)
Chloride: 110 mmol/L (ref 98–111)
Creatinine, Ser: 0.91 mg/dL (ref 0.44–1.00)
GFR calc Af Amer: >60 mL/min (ref >60)
GFR calc non Af Amer: >60 mL/min (ref >60)
Glucose, Bld: 109 mg/dL — ABNORMAL HIGH (ref 70–99)
Potassium: 4.6 mmol/L (ref 3.5–5.1)
Sodium: 141 mmol/L (ref 135–145)

## 2020-01-17 LAB — GLUCOSE, CAPILLARY
Glucose-Capillary: 100 mg/dL — ABNORMAL HIGH (ref 70–99)
Glucose-Capillary: 100 mg/dL — ABNORMAL HIGH (ref 70–99)
Glucose-Capillary: 103 mg/dL — ABNORMAL HIGH (ref 70–99)
Glucose-Capillary: 106 mg/dL — ABNORMAL HIGH (ref 70–99)
Glucose-Capillary: 196 mg/dL — ABNORMAL HIGH (ref 70–99)
Glucose-Capillary: 99 mg/dL (ref 70–99)

## 2020-01-17 LAB — CBC
HCT: 34 % — ABNORMAL LOW (ref 36.0–46.0)
Hemoglobin: 10.6 g/dL — ABNORMAL LOW (ref 12.0–15.0)
MCH: 28.3 pg (ref 26.0–34.0)
MCHC: 31.2 g/dL (ref 30.0–36.0)
MCV: 90.7 fL (ref 80.0–100.0)
Platelets: 220 10*3/uL (ref 150–400)
RBC: 3.75 MIL/uL — ABNORMAL LOW (ref 3.87–5.11)
RDW: 17.2 % — ABNORMAL HIGH (ref 11.5–15.5)
WBC: 7.6 10*3/uL (ref 4.0–10.5)
nRBC: 0 % (ref 0.0–0.2)

## 2020-01-17 LAB — TYPE AND SCREEN
ABO/RH(D): O POS
Antibody Screen: NEGATIVE

## 2020-01-17 LAB — PROTIME-INR
INR: 1 (ref 0.8–1.2)
Prothrombin Time: 13 seconds (ref 11.4–15.2)

## 2020-01-17 LAB — POCT ACTIVATED CLOTTING TIME: Activated Clotting Time: 263 seconds

## 2020-01-17 SURGERY — TRANSCAROTID ARTERY REVASCULARIZATION (TCAR)
Anesthesia: General | Site: Neck | Laterality: Left

## 2020-01-17 MED ORDER — ONDANSETRON HCL 4 MG/2ML IJ SOLN: 4.0000 mg | Freq: Once | INTRAMUSCULAR | Status: DC | PRN

## 2020-01-17 MED ORDER — ALUM & MAG HYDROXIDE-SIMETH 200-200-20 MG/5ML PO SUSP: 15.0000 mL | ORAL | Status: DC | PRN

## 2020-01-17 MED ORDER — METOPROLOL TARTRATE 5 MG/5ML IV SOLN: 2.0000 mg | INTRAVENOUS | Status: DC | PRN

## 2020-01-17 MED ORDER — LACTATED RINGERS IV SOLN: INTRAVENOUS | Status: DC

## 2020-01-17 MED ORDER — SODIUM CHLORIDE 0.9 % IV SOLN: 500.0000 mL | Freq: Once | INTRAVENOUS | Status: DC | PRN

## 2020-01-17 MED ORDER — BISACODYL 10 MG RE SUPP: 10.0000 mg | Freq: Every day | RECTAL | Status: DC | PRN

## 2020-01-17 MED ORDER — CLEVIDIPINE BUTYRATE 0.5 MG/ML IV EMUL
INTRAVENOUS | Status: DC | PRN
  Administered 2020-01-17: 2 mg/h via INTRAVENOUS

## 2020-01-17 MED ORDER — MAGNESIUM SULFATE 2 GM/50ML IV SOLN: 2.0000 g | Freq: Every day | INTRAVENOUS | Status: DC | PRN

## 2020-01-17 MED ORDER — CLOPIDOGREL BISULFATE 75 MG PO TABS
150.0000 mg | ORAL_TABLET | Freq: Once | ORAL | Status: AC
  Administered 2020-01-17: 18:00:00 150 mg via ORAL
  Filled 2020-01-17: qty 2

## 2020-01-17 MED ORDER — IODIXANOL 320 MG/ML IV SOLN
INTRAVENOUS | Status: DC | PRN
  Administered 2020-01-17: 28 mL via INTRAVENOUS

## 2020-01-17 MED ORDER — 0.9 % SODIUM CHLORIDE (POUR BTL) OPTIME
TOPICAL | Status: DC | PRN
  Administered 2020-01-17: 15:00:00 1000 mL

## 2020-01-17 MED ORDER — SUGAMMADEX SODIUM 200 MG/2ML IV SOLN
INTRAVENOUS | Status: DC | PRN
  Administered 2020-01-17: 200 mg via INTRAVENOUS

## 2020-01-17 MED ORDER — POTASSIUM CHLORIDE CRYS ER 20 MEQ PO TBCR: 20.0000 meq | EXTENDED_RELEASE_TABLET | Freq: Every day | ORAL | Status: DC | PRN

## 2020-01-17 MED ORDER — DOCUSATE SODIUM 100 MG PO CAPS
100.0000 mg | ORAL_CAPSULE | Freq: Every day | ORAL | Status: DC
  Administered 2020-01-18: 11:00:00 100 mg via ORAL
  Filled 2020-01-17: qty 1

## 2020-01-17 MED ORDER — HYDRALAZINE HCL 20 MG/ML IJ SOLN
INTRAMUSCULAR | Status: DC | PRN
  Administered 2020-01-17 (×2): 10 mg via INTRAVENOUS

## 2020-01-17 MED ORDER — MORPHINE SULFATE (PF) 2 MG/ML IV SOLN: 1.0000 mg | INTRAVENOUS | Status: DC | PRN

## 2020-01-17 MED ORDER — FENTANYL CITRATE (PF) 250 MCG/5ML IJ SOLN
INTRAMUSCULAR | Status: AC
  Filled 2020-01-17: qty 5

## 2020-01-17 MED ORDER — CEFAZOLIN SODIUM-DEXTROSE 2-4 GM/100ML-% IV SOLN
2.0000 g | Freq: Three times a day (TID) | INTRAVENOUS | Status: AC
  Administered 2020-01-17 – 2020-01-18 (×2): 2 g via INTRAVENOUS
  Filled 2020-01-17 (×3): qty 100

## 2020-01-17 MED ORDER — EPHEDRINE SULFATE 50 MG/ML IJ SOLN
INTRAMUSCULAR | Status: DC | PRN
  Administered 2020-01-17: 10 mg via INTRAVENOUS

## 2020-01-17 MED ORDER — PROTAMINE SULFATE 10 MG/ML IV SOLN
INTRAVENOUS | Status: DC | PRN
  Administered 2020-01-17: 50 mg via INTRAVENOUS

## 2020-01-17 MED ORDER — FENTANYL CITRATE (PF) 250 MCG/5ML IJ SOLN
INTRAMUSCULAR | Status: DC | PRN
  Administered 2020-01-17 (×3): 50 ug via INTRAVENOUS

## 2020-01-17 MED ORDER — PANTOPRAZOLE SODIUM 40 MG PO TBEC
40.0000 mg | DELAYED_RELEASE_TABLET | Freq: Every day | ORAL | Status: DC
  Administered 2020-01-18: 11:00:00 40 mg via ORAL
  Filled 2020-01-17: qty 1

## 2020-01-17 MED ORDER — ONDANSETRON HCL 4 MG/2ML IJ SOLN
INTRAMUSCULAR | Status: DC | PRN
  Administered 2020-01-17: 4 mg via INTRAVENOUS

## 2020-01-17 MED ORDER — HEMOSTATIC AGENTS (NO CHARGE) OPTIME
TOPICAL | Status: DC | PRN
  Administered 2020-01-17: 1 via TOPICAL

## 2020-01-17 MED ORDER — HEPARIN SODIUM (PORCINE) 1000 UNIT/ML IJ SOLN
INTRAMUSCULAR | Status: DC | PRN
  Administered 2020-01-17: 8000 [IU] via INTRAVENOUS

## 2020-01-17 MED ORDER — ROCURONIUM BROMIDE 10 MG/ML (PF) SYRINGE
PREFILLED_SYRINGE | INTRAVENOUS | Status: DC | PRN
  Administered 2020-01-17: 80 mg via INTRAVENOUS

## 2020-01-17 MED ORDER — PROPOFOL 10 MG/ML IV BOLUS
INTRAVENOUS | Status: DC | PRN
  Administered 2020-01-17: 30 mg via INTRAVENOUS
  Administered 2020-01-17: 120 mg via INTRAVENOUS

## 2020-01-17 MED ORDER — SODIUM CHLORIDE 0.9 % IV SOLN
INTRAVENOUS | Status: AC
  Filled 2020-01-17: qty 1.2

## 2020-01-17 MED ORDER — PROTAMINE SULFATE 10 MG/ML IV SOLN
INTRAVENOUS | Status: AC
  Filled 2020-01-17: qty 5

## 2020-01-17 MED ORDER — FENTANYL CITRATE (PF) 100 MCG/2ML IJ SOLN
25.0000 ug | INTRAMUSCULAR | Status: DC | PRN
  Administered 2020-01-17: 17:00:00 25 ug via INTRAVENOUS

## 2020-01-17 MED ORDER — LIDOCAINE 2% (20 MG/ML) 5 ML SYRINGE
INTRAMUSCULAR | Status: DC | PRN
  Administered 2020-01-17: 80 mg via INTRAVENOUS

## 2020-01-17 MED ORDER — HYDRALAZINE HCL 20 MG/ML IJ SOLN: 5.0000 mg | INTRAMUSCULAR | Status: DC | PRN

## 2020-01-17 MED ORDER — PHENYLEPHRINE HCL-NACL 10-0.9 MG/250ML-% IV SOLN
INTRAVENOUS | Status: DC | PRN
  Administered 2020-01-17: 50 ug/min via INTRAVENOUS

## 2020-01-17 MED ORDER — FENTANYL CITRATE (PF) 100 MCG/2ML IJ SOLN
INTRAMUSCULAR | Status: AC
  Administered 2020-01-17: 17:00:00 25 ug via INTRAVENOUS
  Filled 2020-01-17: qty 2

## 2020-01-17 MED ORDER — PROPOFOL 10 MG/ML IV BOLUS
INTRAVENOUS | Status: AC
  Filled 2020-01-17: qty 20

## 2020-01-17 MED ORDER — GLYCOPYRROLATE PF 0.2 MG/ML IJ SOSY
PREFILLED_SYRINGE | INTRAMUSCULAR | Status: DC | PRN
  Administered 2020-01-17: .2 mg via INTRAVENOUS

## 2020-01-17 MED ORDER — DEXAMETHASONE SODIUM PHOSPHATE 10 MG/ML IJ SOLN
INTRAMUSCULAR | Status: DC | PRN
  Administered 2020-01-17: 4 mg via INTRAVENOUS

## 2020-01-17 MED ORDER — PHENYLEPHRINE 40 MCG/ML (10ML) SYRINGE FOR IV PUSH (FOR BLOOD PRESSURE SUPPORT)
PREFILLED_SYRINGE | INTRAVENOUS | Status: DC | PRN
  Administered 2020-01-17: 40 ug via INTRAVENOUS

## 2020-01-17 MED ORDER — LABETALOL HCL 5 MG/ML IV SOLN: 10.0000 mg | INTRAVENOUS | Status: DC | PRN

## 2020-01-17 MED ORDER — LACTATED RINGERS IV SOLN: INTRAVENOUS | Status: DC | PRN

## 2020-01-17 MED ORDER — SODIUM CHLORIDE 0.9 % IV SOLN: INTRAVENOUS | Status: DC

## 2020-01-17 MED ORDER — SODIUM CHLORIDE 0.9 % IV SOLN
INTRAVENOUS | Status: DC | PRN
  Administered 2020-01-17: 500 mL

## 2020-01-17 MED ORDER — PHENOL 1.4 % MT LIQD: 1.0000 | OROMUCOSAL | Status: DC | PRN

## 2020-01-17 MED ORDER — GUAIFENESIN-DM 100-10 MG/5ML PO SYRP: 15.0000 mL | ORAL_SOLUTION | ORAL | Status: DC | PRN

## 2020-01-17 MED ORDER — CEFAZOLIN SODIUM-DEXTROSE 1-4 GM/50ML-% IV SOLN
INTRAVENOUS | Status: DC | PRN
  Administered 2020-01-17: 2 g via INTRAVENOUS

## 2020-01-17 SURGICAL SUPPLY — 63 items
ADH SKN CLS APL DERMABOND .7 (GAUZE/BANDAGES/DRESSINGS) ×1
ADPR TBG 2 MALE LL ART (MISCELLANEOUS)
BAG BANDED W/RUBBER/TAPE 36X54 (MISCELLANEOUS) ×3 IMPLANT
BAG EQP BAND 135X91 W/RBR TAPE (MISCELLANEOUS) ×1
BALLN STERLING RX 5X30X80 (BALLOONS) ×3
BALLOON STERLING RX 5X30X80 (BALLOONS) IMPLANT
CANISTER SUCT 3000ML PPV (MISCELLANEOUS) ×3 IMPLANT
CATH ROBINSON RED A/P 18FR (CATHETERS) IMPLANT
CATH SUCT 10FR WHISTLE TIP (CATHETERS) ×3 IMPLANT
CLIP VESOCCLUDE MED 6/CT (CLIP) ×3 IMPLANT
CLIP VESOCCLUDE SM WIDE 6/CT (CLIP) ×3 IMPLANT
COVER DOME SNAP 22 D (MISCELLANEOUS) ×3 IMPLANT
COVER PROBE W GEL 5X96 (DRAPES) ×3 IMPLANT
COVER WAND RF STERILE (DRAPES) ×3 IMPLANT
DERMABOND ADVANCED (GAUZE/BANDAGES/DRESSINGS) ×2
DERMABOND ADVANCED .7 DNX12 (GAUZE/BANDAGES/DRESSINGS) ×1 IMPLANT
DRAPE FEMORAL ANGIO 80X135IN (DRAPES) ×3 IMPLANT
DRAPE INCISE IOBAN 66X45 STRL (DRAPES) ×6 IMPLANT
ELECT REM PT RETURN 9FT ADLT (ELECTROSURGICAL) ×3
ELECTRODE REM PT RTRN 9FT ADLT (ELECTROSURGICAL) ×1 IMPLANT
GLOVE BIOGEL PI IND STRL 7.5 (GLOVE) ×1 IMPLANT
GLOVE BIOGEL PI INDICATOR 7.5 (GLOVE) ×2
GLOVE SURG SS PI 7.5 STRL IVOR (GLOVE) ×3 IMPLANT
GOWN STRL REUS W/ TWL LRG LVL3 (GOWN DISPOSABLE) ×2 IMPLANT
GOWN STRL REUS W/ TWL XL LVL3 (GOWN DISPOSABLE) ×1 IMPLANT
GOWN STRL REUS W/TWL LRG LVL3 (GOWN DISPOSABLE) ×6
GOWN STRL REUS W/TWL XL LVL3 (GOWN DISPOSABLE) ×3
GUIDEWIRE ENROUTE 0.014 (WIRE) ×3 IMPLANT
HEMOSTAT SNOW SURGICEL 2X4 (HEMOSTASIS) ×2 IMPLANT
HEMOSTAT SURGICEL 2X14 (HEMOSTASIS) ×2 IMPLANT
INTRODUCER KIT GALT 7CM (INTRODUCER) ×3
IV ADAPTER SYR DOUBLE MALE LL (MISCELLANEOUS) IMPLANT
KIT BASIN OR (CUSTOM PROCEDURE TRAY) ×3 IMPLANT
KIT ENCORE 26 ADVANTAGE (KITS) ×3 IMPLANT
KIT INTRODUCER GALT 7 (INTRODUCER) ×1 IMPLANT
KIT TURNOVER KIT B (KITS) ×3 IMPLANT
NDL HYPO 25GX1X1/2 BEV (NEEDLE) IMPLANT
NDL PERC 18GX7CM (NEEDLE) ×1 IMPLANT
NEEDLE HYPO 25GX1X1/2 BEV (NEEDLE) IMPLANT
NEEDLE PERC 18GX7CM (NEEDLE) ×3 IMPLANT
PACK CAROTID (CUSTOM PROCEDURE TRAY) ×3 IMPLANT
PAD ARMBOARD 7.5X6 YLW CONV (MISCELLANEOUS) ×6 IMPLANT
POSITIONER HEAD DONUT 9IN (MISCELLANEOUS) ×3 IMPLANT
PROTECTION STATION PRESSURIZED (MISCELLANEOUS) ×3
SET MICROPUNCTURE 5F STIFF (MISCELLANEOUS) ×2 IMPLANT
STATION PROTECTION PRESSURIZED (MISCELLANEOUS) IMPLANT
STENT TRANSCAROTID SYSTEM 8X40 (Permanent Stent) ×2 IMPLANT
STOPCOCK 4 WAY LG BORE MALE ST (IV SETS) ×3 IMPLANT
SUT MNCRL AB 4-0 PS2 18 (SUTURE) ×2 IMPLANT
SUT PROLENE 5 0 C 1 24 (SUTURE) ×2 IMPLANT
SUT PROLENE 6 0 BV (SUTURE) ×6 IMPLANT
SUT SILK 2 0 PERMA HAND 18 BK (SUTURE) ×5 IMPLANT
SUT SILK 2 0 SH (SUTURE) ×3 IMPLANT
SUT VIC AB 3-0 SH 27 (SUTURE) ×9
SUT VIC AB 3-0 SH 27X BRD (SUTURE) ×2 IMPLANT
SYR 10ML LL (SYRINGE) ×9 IMPLANT
SYR 20ML LL LF (SYRINGE) ×3 IMPLANT
SYR CONTROL 10ML LL (SYRINGE) IMPLANT
SYSTEM TRANSCAROTID NEUROPRTCT (MISCELLANEOUS) ×1 IMPLANT
TOWEL GREEN STERILE (TOWEL DISPOSABLE) ×3 IMPLANT
TRANSCAROTID NEUROPROTECT SYS (MISCELLANEOUS) ×3
WATER STERILE IRR 1000ML POUR (IV SOLUTION) ×3 IMPLANT
WIRE BENTSON .035X145CM (WIRE) ×3 IMPLANT

## 2020-01-17 NOTE — Anesthesia Procedure Notes (Signed)
Arterial Line Insertion Start/End2/19/2021 1:15 PM, 01/17/2020 1:25 PM Performed by: Glynda Jaeger, CRNA, CRNA  Preanesthetic checklist: patient identified, IV checked, site marked, risks and benefits discussed, surgical consent, monitors and equipment checked, pre-op evaluation, timeout performed and anesthesia consent Lidocaine 1% used for infiltration Left, radial was placed Catheter size: 20 G Hand hygiene performed  and maximum sterile barriers used  [name]'s test indicative of satisfactory collateral circulation Attempts: 2 Procedure performed without using ultrasound guided technique. Following insertion, dressing applied and Biopatch. Post procedure assessment: normal  Patient tolerated the procedure well with no immediate complications.

## 2020-01-17 NOTE — Progress Notes (Signed)
PROGRESS NOTE    [name]  [id number] DOB: 1946/01/17 DOA: 01/16/2020 PCP: Serita Grammes, MD  Brief Narrative: 74 year old obese female with history of type 2 diabetes, hypertension, OSA, asthma presented to [facility] on 2/15 with generalized weakness, slurring of speech and gait issues. -Subsequently ended up having stroke, TIA work-up, her symptoms resolved by the time she reached the emergency room and was back to baseline, CT head was negative, MRI was also unremarkable, carotid ultrasound and CTA neck noted bilateral carotid artery disease, greater than 70% on the left, subsequently case was discussed with vascular surgery and she was transferred to [facility] for consideration of revascularization   Assessment & Plan:   TIA Symptomatic left carotid artery disease -Patient presented to outside hospital with weakness, slurred speech and word finding difficulty, MRI negative for CVA, on further work-up with CTA neck noted to have 70% bilateral ICA stenosis with an ulcerative plaque on the left carotid, hence felt to have symptomatic left carotid artery disease and was transferred to [facility] for revascularization options -Neurological symptoms resolved and back to baseline on the day of admission itself -Was on aspirin prior to admission, Plavix added  -2D echocardiogram with normal EF, no cardiac source of embolus -Hemoglobin A1c is 5.9 -Continue statin -VVS Dr. Trula Slade consulting plan for left carotid revascularization today   Type 2 diabetes mellitus -Hemoglobin A1c was 5.9 -Continue sliding scale insulin  Chronic normocytic anemia -Stable, monitor  Asthma -stable, no wheezing  HTN -Stable  Obesity, BMI 33.9 -Needs lifestyle modification  DVT prophylaxis: SCDs Code Status: Full code Family Communication: No family at bedside Disposition Plan: Home in 1 to 2 days pending carotid surgery  Consultants:   Vascular surgery   Procedures:    Antimicrobials:    Subjective: -feels well, no complaints  Objective: Vitals:   01/16/20 0852 01/16/20 1243 01/16/20 1950 01/17/20 0251  BP: (!) 179/55 (!) 168/64 (!) 169/65 (!) 165/65  Pulse: (!) 55 69 73 67  Resp: 17 15 20  (!) 23  Temp:  98 F (36.7 C) (!) 97.5 F (36.4 C) 97.7 F (36.5 C)  TempSrc:  Oral Oral Oral  SpO2: 97%  95% 96%  Weight:      Height:        Intake/Output Summary (Last 24 hours) at 01/17/2020 1110 Last data filed at 01/17/2020 0930 Gross per 24 hour  Intake 3 ml  Output --  Net 3 ml   Filed Weights   01/16/20 0114  Weight: 78.8 kg    Examination:  General exam: Appears calm and comfortable , AAOx3 Respiratory system: Clear to auscultation. Cardiovascular system: S1 & S2 heard, RRR.  Gastrointestinal system: Abdomen is nondistended, soft and nontender.Normal bowel sounds heard. Central nervous system: Alert and oriented. No focal neurological deficits. Extremities: no edema Skin: No rashes, lesions or ulcers Psychiatry:Mood & affect appropriate.     Data Reviewed:   CBC: Recent Labs  Lab 01/16/20 0316 01/17/20 0233  WBC 6.4 7.6  HGB 9.7* 10.6*  HCT 31.1* 34.0*  MCV 89.4 90.7  PLT 206 [id number]   Basic Metabolic Panel: Recent Labs  Lab 01/16/20 0316 01/17/20 0233  NA 144 141  K 4.4 4.6  CL 112* 110  CO2 25 22  GLUCOSE 124* 109*  BUN 17 20  CREATININE 0.87 0.91  CALCIUM 8.8* 9.0   GFR: Estimated Creatinine Clearance: 51.1 mL/min (by C-G formula based on SCr of 0.91 mg/dL). Liver Function Tests: Recent Labs  Lab 01/16/20 0316  AST  20  ALT 11  ALKPHOS 54  BILITOT 0.5  PROT 5.8*  ALBUMIN 3.3*   No results for input(s): LIPASE, AMYLASE in the last 168 hours. No results for input(s): AMMONIA in the last 168 hours. Coagulation Profile: Recent Labs  Lab 01/17/20 0233  INR 1.0   Cardiac Enzymes: No results for input(s): CKTOTAL, CKMB, CKMBINDEX, TROPONINI in the last 168 hours. BNP (last 3 results) No results  for input(s): PROBNP in the last 8760 hours. HbA1C: No results for input(s): HGBA1C in the last 72 hours. CBG: Recent Labs  Lab 01/16/20 1155 01/16/20 1639 01/16/20 2043 01/17/20 0028 01/17/20 0625  GLUCAP 143* 121* 110* 100* 103*   Lipid Profile: No results for input(s): CHOL, HDL, LDLCALC, TRIG, CHOLHDL, LDLDIRECT in the last 72 hours. Thyroid Function Tests: No results for input(s): TSH, T4TOTAL, FREET4, T3FREE, THYROIDAB in the last 72 hours. Anemia Panel: No results for input(s): VITAMINB12, FOLATE, FERRITIN, TIBC, IRON, RETICCTPCT in the last 72 hours. Urine analysis:    Component Value Date/Time   COLORURINE YELLOW 02/16/2018 1055   APPEARANCEUR HAZY (A) 02/16/2018 1055   LABSPEC 1.028 02/16/2018 1055   PHURINE 5.0 02/16/2018 1055   GLUCOSEU NEGATIVE 02/16/2018 1055   HGBUR MODERATE (A) 02/16/2018 1055   BILIRUBINUR SMALL (A) 02/16/2018 1055   KETONESUR NEGATIVE 02/16/2018 1055   PROTEINUR 30 (A) 02/16/2018 1055   UROBILINOGEN 0.2 08/02/2007 1303   NITRITE NEGATIVE 02/16/2018 1055   LEUKOCYTESUR SMALL (A) 02/16/2018 1055   Sepsis Labs: @LABRCNTIP (procalcitonin:4,lacticidven:4)  ) Recent Results (from the past 240 hour(s))  SARS CORONAVIRUS 2 (TAT 6-24 HRS) Nasopharyngeal Nasopharyngeal Swab     Status: None   Collection Time: 01/16/20  2:35 AM   Specimen: Nasopharyngeal Swab  Result Value Ref Range Status   SARS Coronavirus 2 NEGATIVE NEGATIVE Final    Comment: (NOTE) SARS-CoV-2 target nucleic acids are NOT DETECTED. The SARS-CoV-2 RNA is generally detectable in upper and lower respiratory specimens during the acute phase of infection. Negative results do not preclude SARS-CoV-2 infection, do not rule out co-infections with other pathogens, and should not be used as the sole basis for treatment or other patient management decisions. Negative results must be combined with clinical observations, patient history, and epidemiological information. The  expected result is Negative. Fact Sheet for Patients: SugarRoll.be Fact Sheet for Healthcare Providers: https://www.woods-mathews.com/ This test is not yet approved or cleared by the [place] FDA and  has been authorized for detection and/or diagnosis of SARS-CoV-2 by FDA under an Emergency Use Authorization (EUA). This EUA will remain  in effect (meaning this test can be used) for the duration of the COVID-19 declaration under Section 56 4(b)(1) of the Act, 21 U.S.C. section 360bbb-3(b)(1), unless the authorization is terminated or revoked sooner. Performed at [facility], [place] [address], [place], [place] [address]   Surgical pcr screen     Status: None   Collection Time: 01/16/20  5:10 PM   Specimen: Nasal Mucosa; Nasal Swab  Result Value Ref Range Status   MRSA, PCR NEGATIVE NEGATIVE Final   Staphylococcus aureus NEGATIVE NEGATIVE Final    Comment: (NOTE) The Xpert SA Assay (FDA approved for NASAL specimens in patients 45 years of age and older), is one component of a comprehensive surveillance program. It is not intended to diagnose infection nor to guide or monitor treatment. Performed at [facility], [place] [address], [place], [place] [address]          Radiology Studies: No results  found.      Scheduled Meds: . aspirin EC  81 mg Oral Daily  . atorvastatin  40 mg Oral q1800  . clopidogrel  75 mg Oral Daily  . insulin aspart  0-9 Units Subcutaneous TID WC  . sodium chloride flush  3 mL Intravenous Q12H   Continuous Infusions:   LOS: 1 day    Time spent: 52min    Domenic Polite, MD [facility]  01/17/2020, 11:10 AM

## 2020-01-17 NOTE — Interval H&P Note (Signed)
History and Physical Interval Note:  01/17/2020 1:37 PM  [name]  has presented today for surgery, with the diagnosis of carotid artery disease..  The various methods of treatment have been discussed with the patient and family. After consideration of risks, benefits and other options for treatment, the patient has consented to  Procedure(s): TRANSCAROTID ARTERY REVASCULARIZATION (Left) as a surgical intervention.  The patient's history has been reviewed, patient examined, no change in status, stable for surgery.  I have reviewed the patient's chart and labs.  Questions were answered to the patient's satisfaction.     Annamarie Major

## 2020-01-17 NOTE — Anesthesia Procedure Notes (Signed)
Procedure Name: Intubation Date/Time: 01/17/2020 2:14 PM Performed by: Clearnce Sorrel, CRNA Pre-anesthesia Checklist: Patient identified, Emergency Drugs available, Suction available and Patient being monitored Patient Re-evaluated:Patient Re-evaluated prior to induction Oxygen Delivery Method: Circle system utilized Preoxygenation: Pre-oxygenation with 100% oxygen Induction Type: IV induction Ventilation: Two handed mask ventilation required and Oral airway inserted - appropriate to patient size Laryngoscope Size: Glidescope and 3 Grade View: Grade I Tube type: Oral Tube size: 7.0 mm Number of attempts: 1 Airway Equipment and Method: Stylet and Oral airway Placement Confirmation: ETT inserted through vocal cords under direct vision,  positive ETCO2 and breath sounds checked- equal and bilateral Secured at: 22 cm Tube secured with: Tape Dental Injury: Teeth and Oropharynx as per pre-operative assessment

## 2020-01-17 NOTE — Progress Notes (Addendum)
  Day of Surgery Note    Subjective: Asked to check left neck incision.  The patient has been in the PACU approximately 30 to 45 minutes.  She is complaining of incisional pain and dry mouth otherwise comfortable   Vitals:   01/17/20 0251 01/17/20 1110  BP: (!) 165/65 (!) 185/65  Pulse: 67 (!) 54  Resp: (!) 23 16  Temp: 97.7 F (36.5 C) 98.6 F (37 C)  SpO2: 96% 98%    Incisions:   Left neck incision well approximated, no bleeding.  Very mild edema Extremities: 5 out of 5 bilateral grip strength, normal active range of motion lower extremities Cardiac: Heart rate and rhythm are regular Lungs: Breathing is nonlabored Neurologic: The patient is alert and oriented x4.  Speech is clear.  Tongue is midline   Assessment/Plan:  This is a 74 y.o. female who is s/p  Left TCAR.  No evidence of hematoma of left neck incision.  Hemodynamically stable.  Neurologically intact  - Risa Grill, PA-C 01/17/2020 4:29 PM [id number]  Status post left TCAR.  I examined her in the recovery room.  She is neurologically intact and acting appropriate.  Incisions are well-healed.  The patient will be given 150 mg loading dose of Plavix.  She is stable for transfer to the floor.  She will need to be discharged home on aspirin Plavix and a statin.  She can follow-up with me in 1 month with a carotid duplex.  Annamarie Major

## 2020-01-17 NOTE — Anesthesia Postprocedure Evaluation (Signed)
Anesthesia Post Note  Patient: [name]  Procedure(s) Performed: TRANSCAROTID ARTERY REVASCULARIZATION (Left Neck)     Patient location during evaluation: PACU Anesthesia Type: General Level of consciousness: awake and alert Pain management: pain level controlled Vital Signs Assessment: post-procedure vital signs reviewed and stable Respiratory status: spontaneous breathing, nonlabored ventilation and respiratory function stable Cardiovascular status: blood pressure returned to baseline and stable Postop Assessment: no apparent nausea or vomiting Anesthetic complications: no    Last Vitals:  Vitals:   01/17/20 1650 01/17/20 1717  BP: (!) 132/59 (!) 123/56  Pulse: 74 73  Resp: 16   Temp: (!) 36.4 C (!) 36.3 C  SpO2: 98%     Last Pain:  Vitals:   01/17/20 1717  TempSrc: Oral  PainSc: 2                  Lynda Rainwater

## 2020-01-17 NOTE — Anesthesia Preprocedure Evaluation (Addendum)
Anesthesia Evaluation  Patient identified by MRN, date of birth, ID band Patient awake    Reviewed: Allergy & Precautions, NPO status , Patient's Chart, lab work & pertinent test results  Airway Mallampati: II  TM Distance: >3 FB Neck ROM: Full    Dental  (+) Dental Advisory Given   Pulmonary asthma , sleep apnea , COPD,    breath sounds clear to auscultation       Cardiovascular hypertension, Pt. on medications  Rhythm:Regular Rate:Normal     Neuro/Psych negative neurological ROS     GI/Hepatic Neg liver ROS, GERD  Medicated,  Endo/Other  diabetes, Type 2  Renal/GU negative Renal ROS     Musculoskeletal  (+) Arthritis ,   Abdominal   Peds  Hematology  (+) anemia ,   Anesthesia Other Findings   Reproductive/Obstetrics                            Lab Results  Component Value Date   WBC 7.6 01/17/2020   HGB 10.6 (L) 01/17/2020   HCT 34.0 (L) 01/17/2020   MCV 90.7 01/17/2020   PLT 220 01/17/2020   Lab Results  Component Value Date   CREATININE 0.91 01/17/2020   BUN 20 01/17/2020   NA 141 01/17/2020   K 4.6 01/17/2020   CL 110 01/17/2020   CO2 22 01/17/2020    Anesthesia Physical Anesthesia Plan  ASA: III  Anesthesia Plan: General   Post-op Pain Management:    Induction: Intravenous  PONV Risk Score and Plan: 3 and Dexamethasone, Ondansetron and Treatment may vary due to age or medical condition  Airway Management Planned: Oral ETT  Additional Equipment: Arterial line  Intra-op Plan:   Post-operative Plan: Extubation in OR  Informed Consent: I have reviewed the patients History and Physical, chart, labs and discussed the procedure including the risks, benefits and alternatives for the proposed anesthesia with the patient or authorized representative who has indicated his/her understanding and acceptance.     Dental advisory given  Plan Discussed with:  CRNA  Anesthesia Plan Comments:         Anesthesia Quick Evaluation

## 2020-01-17 NOTE — Op Note (Signed)
Patient name: [name] MRN: [id number] DOB: 01/27/1946 Sex: female  01/17/2020 Pre-operative Diagnosis: Symptomatic left carotid stenosis Post-operative diagnosis:  Same Surgeon:  Annamarie Major Assistants: Shanon Rosser Procedure:   #1: Left transcarotid artery revascularization (TCAR)   #2: Flow reversal neuro protection   #3: Ultrasound-guided access, right common femoral vein Anesthesia: General Blood Loss: Minimal Specimens: None  Findings: Approximate 70% bifurcation stenosis  Flow reversal time: 7 minutes Predilation balloon: 5 x 30 Stent: 8 x 40 ENROUTE Contrast volume: 2 8 cc Dose area 870.00 Fluoroscopy time: 2.5 minutes Procedure time: 50 minutes  Indications: This is a 74 year old female who presented with weakness fatigue and slurred speech.  CT scan revealed approximate 70% bilateral carotid stenosis.  Her events were felt to represent a neurologic event likely from her left carotid artery.  I discussed proceeding with left sided TCAR.  The risk and benefits of the procedure were discussed with the patient and her husband preoperatively and they wish to proceed.  Procedure:  The patient was identified in the holding area and taken to [place] 16  The patient was then placed supine on the table. general anesthesia was administered.  The patient was prepped and draped in the usual sterile fashion.  A time out was called and antibiotics were administered.  Ultrasound was used to evaluate the right common femoral vein which was widely patent and easily compressible.  The right common femoral vein was then cannulated under ultrasound guidance with a micropuncture needle.  An 018 wire was advanced without resistance and a micropuncture sheath was placed.  Next an 035 wire was inserted, the micropuncture sheath was removed and the flow reversal sheath was placed.  Next, attention was turned towards the left neck.  Ultrasound identified the location of the left common  carotid artery.  A transverse incision was made 1 fingerbreadth above the clavicle.  Cautery was used about subcutaneous tissue and the platysma muscle.  The 2 heads of the sternocleidomastoid were identified.  An avascular plane was developed between the 2 heads of the muscle.  I first identified the internal jugular vein as well as the vagus nerve which was protected.  Next the common carotid artery was circumferentially dissected free and encircled with a Potts vessel loop and an umbilical tape.  The patient was fully heparinized.  ACT measurements were greater than 250.  A 5-0 Prolene was used to place a pursestring suture in the adventitia of the common carotid artery.  A Ray-Tec was placed behind the common carotid artery to elevated.  A micropuncture needle was then used to cannulate the left common carotid artery.  An 018 wire was then inserted up to the mark on the wire.  The needle was removed and a micropuncture sheath was inserted to 3 cm.  A contrast injection was then performed through the sheath locating the carotid bifurcation.  Next, a carotid Amplatz wire was inserted up to the mark on the screen and the TCAR sheath was then inserted without difficulty.  The sheath was then sutured to the skin with 2 silk 2-0 sutures.  2 views were then obtained in different obliquities.  There is no evidence of dissection from the sheath insertion.  The flow reversal circuit was then set up.  Passive flow reversal was initiated, confirmed with a saline injection.  Next, a TCAR timeout was performed.  The common carotid artery was then occluded in active flow reversal was initiated.  This was confirmed with  a saline injection.  Next a 018 wire and a 5 x 30 balloon were inserted through the sheath.  The wire was easily navigated into the internal carotid artery which was confirmed with a contrast injection.  A 5 x 30 balloon was then positioned over the lesion and taken to 8 atm.  There was no hemodynamic response.   There was no waste within the balloon.  The balloon was then removed and a 8 x 40 ENROUTE stent was inserted into the appropriate position and then deployed.  We waited approximately 2 minutes and performed a completion arteriogram which showed a widely patent stent.  No post dilation was required.  The wire was then removed.  The clamp on the carotid artery was released.  The flow reversal circuit was removed and the blood was returned back to the patient.  The sheath was removed and the arteriotomy site closed by securing the 5-0 Prolene suture.  Hand-held Doppler was used to evaluate the common carotid artery which had a normal signal above and below the arteriotomy site.  50 mg of protamine was then administered.  The sheath in the groin was removed and manual pressure was held for hemostasis.  The carotid incision was irrigated.  Hemostasis was achieved.  The 2 heads of the sternocleidomastoid were reapproximated with 3-0 Vicryl.  The platysma muscle was closed with 3-0 Vicryl.  Subcuticular closure was performed with 3-0 Vicryl.  Dermabond was applied.  The patient was then successfully extubated, found to be neurologically intact and taken to recovery in stable condition.   Disposition: To PACU stable.   Theotis Burrow, M.D., [facility] [place] Office: [phone] Pager:  [phone]

## 2020-01-17 NOTE — Transfer of Care (Signed)
Immediate Anesthesia Transfer of Care Note  Patient: [name]  Procedure(s) Performed: Dorthula Perfect ARTERY REVASCULARIZATION (Left Neck)  Patient Location: PACU  Anesthesia Type:General  Level of Consciousness: awake, alert  and patient cooperative  Airway & Oxygen Therapy: Patient Spontanous Breathing and Patient connected to face mask oxygen  Post-op Assessment: Report given to RN, Post -op Vital signs reviewed and stable and Patient moving all extremities X 4  Post vital signs: Reviewed and stable  Last Vitals:  Vitals Value Taken Time  BP 120/53 01/17/20 1550  Temp    Pulse 79 01/17/20 1552  Resp 19 01/17/20 1552  SpO2 98 % 01/17/20 1552  Vitals shown include unvalidated device data.  Last Pain:  Vitals:   01/17/20 1110  TempSrc: Oral  PainSc:          Complications: No apparent anesthesia complications

## 2020-01-18 LAB — CBC
HCT: 31.6 % — ABNORMAL LOW (ref 36.0–46.0)
Hemoglobin: 9.5 g/dL — ABNORMAL LOW (ref 12.0–15.0)
MCH: 28.3 pg (ref 26.0–34.0)
MCHC: 30.1 g/dL (ref 30.0–36.0)
MCV: 94 fL (ref 80.0–100.0)
Platelets: 202 10*3/uL (ref 150–400)
RBC: 3.36 MIL/uL — ABNORMAL LOW (ref 3.87–5.11)
RDW: 21.9 % — ABNORMAL HIGH (ref 11.5–15.5)
WBC: 9.8 10*3/uL (ref 4.0–10.5)
nRBC: 0 % (ref 0.0–0.2)

## 2020-01-18 LAB — BASIC METABOLIC PANEL
Anion gap: 14 (ref 5–15)
BUN: 13 mg/dL (ref 8–23)
CO2: 19 mmol/L — ABNORMAL LOW (ref 22–32)
Calcium: 8.2 mg/dL — ABNORMAL LOW (ref 8.9–10.3)
Chloride: 108 mmol/L (ref 98–111)
Creatinine, Ser: 0.77 mg/dL (ref 0.44–1.00)
GFR calc Af Amer: >60 mL/min (ref >60)
GFR calc non Af Amer: >60 mL/min (ref >60)
Glucose, Bld: 112 mg/dL — ABNORMAL HIGH (ref 70–99)
Potassium: 4.7 mmol/L (ref 3.5–5.1)
Sodium: 141 mmol/L (ref 135–145)

## 2020-01-18 LAB — GLUCOSE, CAPILLARY
Glucose-Capillary: 112 mg/dL — ABNORMAL HIGH (ref 70–99)
Glucose-Capillary: 149 mg/dL — ABNORMAL HIGH (ref 70–99)

## 2020-01-18 MED ORDER — CLOPIDOGREL BISULFATE 75 MG PO TABS: 75.0000 mg | ORAL_TABLET | Freq: Every day | ORAL | 2 refills | Status: DC

## 2020-01-18 MED ORDER — HYDROCODONE-ACETAMINOPHEN 5-325 MG PO TABS: 1.0000 | ORAL_TABLET | Freq: Four times a day (QID) | ORAL | 0 refills | Status: DC | PRN

## 2020-01-18 MED ORDER — ACETAMINOPHEN 325 MG PO TABS: 650.0000 mg | ORAL_TABLET | Freq: Four times a day (QID) | ORAL | Status: DC | PRN

## 2020-01-18 MED ORDER — ATORVASTATIN CALCIUM 20 MG PO TABS: 40.0000 mg | ORAL_TABLET | Freq: Every evening | ORAL | Status: DC

## 2020-01-18 NOTE — Progress Notes (Signed)
Discharge instructions given to [name] and [name].  Discussed signs and symptoms to watch for and when to contact the physician.   Discussed new medications, medication changes and side effects.  Discussed follow up appointments.  Verbalized understanding.

## 2020-01-18 NOTE — Discharge Summary (Signed)
Physician Discharge Summary  [name] [id number] DOB: 1946-09-11 DOA: 01/16/2020  PCP: Serita Grammes, MD  Admit date: 01/16/2020 Discharge date: 01/18/2020  Time spent: 45 minutes  Recommendations for Outpatient Follow-up:  PCP in 1 week, needs lifestyle modification for obesity Vascular Dr. Trula Slade in 1 to 2 weeks  Discharge Diagnoses:  TIA Symptomatic left carotid artery disease  Bilateral carotid artery stenosis Obesity, BMI of 34   Essential hypertension   Dyslipidemia   Diabetes mellitus type II, non insulin dependent (HCC)   Asthma   History of transient ischemic attack (TIA)   Normocytic anemia   Discharge Condition: Stable  Diet recommendation: Diabetic  Filed Weights   01/16/20 0114  Weight: 78.8 kg    History of present illness:  74 year old obese female with history of type 2 diabetes, hypertension, OSA, asthma presented to [facility] on 2/15 with generalized weakness, slurring of speech and gait issues. -Subsequently ended up having stroke, TIA work-up, her symptoms resolved by the time she reached the emergency room and was back to baseline, CT head was negative, MRI was also unremarkable, carotid ultrasound and CTA neck noted bilateral carotid artery disease, greater than 70% on the left,subsequently case was discussed with vascular surgery and she was transferred to [facility] for consideration of revascularization  Hospital Course:  TIA Symptomatic left carotid artery disease -Patient presented to outside hospital with weakness, slurred speech and word finding difficulty, MRI negative for CVA, on further work-up with CTA neck noted to have 70% bilateral ICA stenosis with an ulcerative plaque on the left carotid, hence felt to have symptomatic left carotid artery disease and was transferred to [facility] for revascularization options -Neurological symptoms resolved and back to baseline on the day of admission itself -Was on aspirin prior to admission,  Plavix added  -2D echocardiogram with normal EF, no cardiac source of embolus -Hemoglobin A1c is 5.9 -Continue statin -VVS Dr. Trula Slade consulting plan for left carotid revascularization today  Type 2 diabetes mellitus -Hemoglobin A1c was 5.9 -Continue sliding scale insulin  Chronic normocytic anemia -Stable, monitor  Asthma -stable, no wheezing  HTN -Stable  Obesity, BMI 33.9 -Needs lifestyle modification   Procedures: Pre-operative Diagnosis: Symptomatic left carotid stenosis Surgeon:  Annamarie Major Assistants: Shanon Rosser Procedure:   #1: Left transcarotid artery revascularization (TCAR)                         #2: Flow reversal neuro protection Consultations:  Vascular Surgery  Neurology  Discharge Exam: Vitals:   01/18/20 0800 01/18/20 0900  BP: (!) 146/55 (!) 109/36  Pulse:    Resp: 17 18  Temp:    SpO2:      General:AAOx3 Cardiovascular: S1S2/RRR Respiratory:CTAB  Discharge Instructions   Discharge Instructions    Diet - low sodium heart healthy   Complete by: As directed    Diet Carb Modified   Complete by: As directed    Increase activity slowly   Complete by: As directed      Allergies as of 01/18/2020      Reactions   Codeine Nausea And Vomiting   Sulfa Antibiotics Other (See Comments)   Allergic per pt's mother       Medication List    STOP taking these medications   beclomethasone 80 MCG/ACT inhaler Commonly known as: Qvar RediHaler   Dulera 200-5 MCG/ACT Aero Generic drug: mometasone-formoterol   ipratropium 0.06 % nasal spray Commonly known as: ATROVENT     TAKE these  medications   acetaminophen 325 MG tablet Commonly known as: TYLENOL Take 2 tablets (650 mg total) by mouth every 6 (six) hours as needed for mild pain (or Fever >/= 101).   albuterol 108 (90 Base) MCG/ACT inhaler Commonly known as: VENTOLIN HFA INHALE 2 PUFFS BY MOUTH 4 TIMES A DAY AS NEEDED What changed:   how much to take  how to take  this  when to take this  reasons to take this  additional instructions  Another medication with the same name was removed. Continue taking this medication, and follow the directions you see here.   amLODipine 5 MG tablet Commonly known as: NORVASC Take 5 mg by mouth daily.   aspirin EC 81 MG tablet Take 81 mg by mouth daily.   atorvastatin 20 MG tablet Commonly known as: LIPITOR Take 2 tablets (40 mg total) by mouth every evening. What changed: how much to take   citalopram 20 MG tablet Commonly known as: CELEXA Take 20 mg by mouth daily.   clopidogrel 75 MG tablet Commonly known as: PLAVIX Take 1 tablet (75 mg total) by mouth daily.   dicyclomine 20 MG tablet Commonly known as: BENTYL Take 20 mg by mouth 2 (two) times daily.   EPINEPHrine 0.3 mg/0.3 mL Soaj injection Commonly known as: EPI-PEN USE AS DIRECTED FOR LIFE THREATENING ALLERGIC REACTION   fluticasone 50 MCG/ACT nasal spray Commonly known as: FLONASE USE ONE SPRAY IN EACH NOSTRIL ONCE OR TWICE DAILY What changed: See the new instructions.   furosemide 20 MG tablet Commonly known as: LASIX Take 20 mg by mouth 2 (two) times daily.   HYDROcodone-acetaminophen 5-325 MG tablet Commonly known as: NORCO/VICODIN Take 1 tablet by mouth every 6 (six) hours as needed for moderate pain.   ipratropium-albuterol 0.5-2.5 (3) MG/3ML Soln Commonly known as: DUONEB Take 3 mLs by nebulization every 6 (six) hours as needed (shortness of breath/wheezing).   levocetirizine 5 MG tablet Commonly known as: XYZAL Take 5 mg by mouth at bedtime.   montelukast 10 MG tablet Commonly known as: SINGULAIR Take 10 mg by mouth daily.   omalizumab 150 MG injection Commonly known as: XOLAIR Inject 150 mg into the skin every 28 (twenty-eight) days. Notes to patient: Use as directed    omeprazole 40 MG capsule Commonly known as: PRILOSEC TAKE 1 CAPSULE BY MOUTH TWICE A DAY   oxybutynin 15 MG 24 hr tablet Commonly known  as: DITROPAN XL Take 15 mg by mouth daily.   pioglitazone 30 MG tablet Commonly known as: ACTOS Take 30 mg by mouth daily.   PROBIOTIC ACIDOPHILUS PO Take 2 capsules by mouth every morning.   PROBIOTIC DAILY PO Take 1 capsule by mouth daily.   telmisartan 80 MG tablet Commonly known as: MICARDIS Take 80 mg by mouth daily.   Trelegy Ellipta 100-62.5-25 MCG/INH Aepb Generic drug: Fluticasone-Umeclidin-Vilant Inhale 1 puff into the lungs every morning.   Victoza 18 MG/3ML Sopn Generic drug: liraglutide Inject 0.6 mg into the skin every morning.      Allergies  Allergen Reactions  . Codeine Nausea And Vomiting  . Sulfa Antibiotics Other (See Comments)    Allergic per pt's mother    Follow-up Information    Serafina Mitchell, MD Follow up in 4 week(s).   Specialties: Vascular Surgery, Cardiology Why: office will call to arrange appointment (sent) Contact information: [address] [place] [address] [phone]            The results of significant diagnostics  from this hospitalization (including imaging, microbiology, ancillary and laboratory) are listed below for reference.    Significant Diagnostic Studies: Structural Heart Procedure  Result Date: 01/17/2020 See surgical note for result.  HYBRID OR IMAGING ([facility] ONLY)  Result Date: 01/17/2020 There is no interpretation for this exam.  This order is for images obtained during a surgical procedure.  Please See "Surgeries" Tab for more information regarding the procedure.    Microbiology: Recent Results (from the past 240 hour(s))  SARS CORONAVIRUS 2 (TAT 6-24 HRS) Nasopharyngeal Nasopharyngeal Swab     Status: None   Collection Time: 01/16/20  2:35 AM   Specimen: Nasopharyngeal Swab  Result Value Ref Range Status   SARS Coronavirus 2 NEGATIVE NEGATIVE Final    Comment: (NOTE) SARS-CoV-2 target nucleic acids are NOT DETECTED. The SARS-CoV-2 RNA is generally detectable in upper and lower respiratory  specimens during the acute phase of infection. Negative results do not preclude SARS-CoV-2 infection, do not rule out co-infections with other pathogens, and should not be used as the sole basis for treatment or other patient management decisions. Negative results must be combined with clinical observations, patient history, and epidemiological information. The expected result is Negative. Fact Sheet for Patients: SugarRoll.be Fact Sheet for Healthcare Providers: https://www.woods-mathews.com/ This test is not yet approved or cleared by the [place] FDA and  has been authorized for detection and/or diagnosis of SARS-CoV-2 by FDA under an Emergency Use Authorization (EUA). This EUA will remain  in effect (meaning this test can be used) for the duration of the COVID-19 declaration under Section 56 4(b)(1) of the Act, 21 U.S.C. section 360bbb-3(b)(1), unless the authorization is terminated or revoked sooner. Performed at [facility], [place] [address], [place], [place] [address]   Surgical pcr screen     Status: None   Collection Time: 01/16/20  5:10 PM   Specimen: Nasal Mucosa; Nasal Swab  Result Value Ref Range Status   MRSA, PCR NEGATIVE NEGATIVE Final   Staphylococcus aureus NEGATIVE NEGATIVE Final    Comment: (NOTE) The Xpert SA Assay (FDA approved for NASAL specimens in patients 7 years of age and older), is one component of a comprehensive surveillance program. It is not intended to diagnose infection nor to guide or monitor treatment. Performed at [facility], [place] [address], [place], [place] [address]      Labs: Basic Metabolic Panel: Recent Labs  Lab 01/16/20 0316 01/17/20 0233 01/18/20 0714  NA 144 141 141  K 4.4 4.6 4.7  CL 112* 110 108  CO2 25 22 19*  GLUCOSE 124* 109* 112*  BUN 17 20 13   CREATININE 0.87 0.91 0.77  CALCIUM 8.8* 9.0 8.2*   Liver Function Tests: Recent Labs  Lab 01/16/20 0316   AST 20  ALT 11  ALKPHOS 54  BILITOT 0.5  PROT 5.8*  ALBUMIN 3.3*   No results for input(s): LIPASE, AMYLASE in the last 168 hours. No results for input(s): AMMONIA in the last 168 hours. CBC: Recent Labs  Lab 01/16/20 0316 01/17/20 0233 01/18/20 0454  WBC 6.4 7.6 9.8  HGB 9.7* 10.6* 9.5*  HCT 31.1* 34.0* 31.6*  MCV 89.4 90.7 94.0  PLT 206 220 202   Cardiac Enzymes: No results for input(s): CKTOTAL, CKMB, CKMBINDEX, TROPONINI in the last 168 hours. BNP: BNP (last 3 results) No results for input(s): BNP in the last 8760 hours.  ProBNP (last 3 results) No results for input(s): PROBNP in the last 8760 hours.  CBG: Recent  Labs  Lab 01/17/20 1112 01/17/20 1303 01/17/20 1550 01/17/20 2129 01/18/20 0631  GLUCAP 100* 99 106* 196* 112*       Signed:  Domenic Polite MD.  [facility] 01/18/2020, 11:07 AM

## 2020-01-18 NOTE — Discharge Instructions (Signed)
Carotid Artery Disease  Carotid artery disease, also called carotid artery stenosis, is the narrowing or blockage of one or both carotid arteries. The carotid arteries are the two main blood vessels on either side of the neck. They supply blood to the brain, other parts of the head, and the neck. Carotid artery disease increases your risk for a stroke or a transient ischemic attack (TIA). A TIA is a "mini-stroke" that causes stroke-like symptoms that then go away quickly. What are the causes? This condition is mainly caused by a narrowing and hardening of the carotid arteries (atherosclerosis). The carotid arteries can become narrow or clogged with a buildup of fat, cholesterol, calcium, and other substances (plaque). What increases the risk? The following factors may make you more likely to develop this condition:  Having certain medical conditions, such as: ? High cholesterol. ? High blood pressure (hypertension). ? Diabetes. ? Obesity.  Smoking.  A family history of cardiovascular disease.  Inactivity or lack of regular exercise.  Being female. Men have an increased risk of developing atherosclerosis earlier in life than women.  Old age. What are the signs or symptoms? This condition may not have any signs or symptoms until a stroke or TIA occurs. In some cases, your health care provider may be able to hear a whooshing sound (bruit). This can indicate a change in blood flow caused by plaque buildup. An eye exam can also help identify signs of the condition. How is this diagnosed? This condition may be diagnosed with a physical exam, your medical history, and your family's medical history. You may also have tests that look at the blood flow in your carotid arteries, such as:  Carotid artery ultrasound, which uses sound waves to create pictures to show if the arteries are narrow or blocked.  Tests that use a dye injected into a vein to highlight your arteries on images, such  as: ? Carotid or cerebral angiography, which uses X-rays. ? Computerized tomographic angiography (CTA), which uses CT scans. ? Magnetic resonance angiography (MRA), which uses MRI. How is this treated? This condition may be treated with a combination of treatments. Treatment options include:  Lifestyle changes, such as: ? Quitting smoking. ? Exercising regularly or as told by your health care provider. ? Eating a heart-healthy diet. ? Managing stress. ? Maintaining a healthy weight.  Medicines to control blood pressure, cholesterol, and blood clotting.  Surgery. You may have: ? A carotid endarterectomy. This is a surgery to remove the blockages in the carotid arteries. ? A carotid angioplasty with stenting. This is a procedure in which a small mesh tube (stent) is used to widen the blocked carotid arteries. Follow these instructions at home: Eating and drinking Follow instructions about your diet from your health care provider. It is important to:  Eat a healthy diet that is low in saturated fats and includes plenty of fresh fruits, vegetables, and lean meats.  Avoid foods that are high in fat and salt (sodium).  Avoid foods that are fried, overly processed, or have poor nutritional value.  Lifestyle   Maintain a healthy weight.  Do exercises as told by your health care provider to stay physically active. It is recommended that each week you get at least 150 minutes of moderate-intensity exercise or 75 minutes of exercise that takes a lot of effort.  Do not use any products that contain nicotine or tobacco, such as cigarettes, e-cigarettes, and chewing tobacco. If you need help quitting, ask your health care provider.    Do not drink alcohol if: ? Your health care provider tells you not to drink. ? You are pregnant, may be pregnant, or are planning to become pregnant.  If you drink alcohol: ? Limit how much you use to:  0-1 drink a day for women.  0-2 drinks a day for  men. ? Be aware of how much alcohol is in your drink. In the [place] one drink equals one 12 oz bottle of beer (355 mL), one 5 oz glass of wine (148 mL), or one 1 oz glass of hard liquor (44 mL).  Do not use drugs.  Manage your stress. Ask your health care provider for stress management tips. General instructions  Take over-the-counter and prescription medicines only as told by your health care provider.  Keep all follow-up visits as told by your health care provider. This is important. Where to find more information  American Heart Association: www.heart.org Get help right away if:  You have any symptoms of a stroke. "BE FAST" is an easy way to remember the main warning signs of a stroke: ? B - Balance. Signs are dizziness, sudden trouble walking, or loss of balance. ? E - Eyes. Signs are trouble seeing or a sudden change in vision. ? F - Face. Signs are sudden weakness or numbness of the face, or the face or eyelid drooping on one side. ? A - Arms. Signs are weakness or numbness in an arm. This happens suddenly and usually on one side of the body. ? S - Speech. Signs are sudden trouble speaking, slurred speech, or trouble understanding what people say. ? T - Time. Time to call emergency services. Write down what time symptoms started.  You have other signs of a stroke, such as: ? A sudden, severe headache with no known cause. ? Nausea or vomiting. ? Seizure. These symptoms may represent a serious problem that is an emergency. Do not wait to see if the symptoms will go away. Get medical help right away. Call your local emergency services (911 in the [place] Do not drive yourself to the hospital. Summary  Carotid artery disease, also called carotid artery stenosis, is the narrowing or blockage of one or both carotid arteries.  Carotid artery disease increases your risk for a stroke or a transient ischemic attack (TIA).  This condition can be treated with lifestyle changes, medicines,  surgery, or a combination of these treatments.  Get help right away if you have any symptoms of stroke. The acronym BEFAST is an easy way to remember the main warning signs of stroke. This information is not intended to replace advice given to you by your health care provider. Make sure you discuss any questions you have with your health care provider. Document Revised: 05/27/2019 Document Reviewed: 05/27/2019 Elsevier Patient Education  2020 [employer].   May shower. Keep incision clean and dry.

## 2020-01-18 NOTE — Progress Notes (Addendum)
   VASCULAR SURGERY ASSESSMENT & PLAN:   POD 1 - TCAR - (CAROTID STENT): The patient is doing well.  No hematoma right groin.  Neurologic exam intact.  VASCULAR QUALITY INITIATIVE: She is on aspirin, Plavix, and a statin.  Okay for discharge today from a vascular standpoint.  SUBJECTIVE:   No complaints.  Wants to go home.  PHYSICAL EXAM:   Vitals:   01/18/20 0300 01/18/20 0400 01/18/20 0500 01/18/20 0700  BP: (!) 154/60 (!) 151/60 (!) 164/66 (!) 159/71  Pulse: 70 71 68 65  Resp: 19 20 11 17   Temp:  (!) 97.5 F (36.4 C)  98.4 F (36.9 C)  TempSrc:  Oral  Oral  SpO2: 96% 95% 97% 97%  Weight:      Height:       Her left neck incision looks fine. No groin hematoma on the right. NEURO: Intact  LABS:   Lab Results  Component Value Date   WBC 9.8 01/18/2020   HGB 9.5 (L) 01/18/2020   HCT 31.6 (L) 01/18/2020   MCV 94.0 01/18/2020   PLT 202 01/18/2020   Lab Results  Component Value Date   CREATININE 0.77 01/18/2020   CBG (last 3)  Recent Labs    01/17/20 1550 01/17/20 2129 01/18/20 0631  GLUCAP 106* 196* 112*    PROBLEM LIST:    Principal Problem:   Bilateral carotid artery stenosis Active Problems:   Essential hypertension   Dyslipidemia   Diabetes mellitus type II, non insulin dependent (HCC)   Asthma   History of transient ischemic attack (TIA)   Normocytic anemia   CURRENT MEDS:   . aspirin EC  81 mg Oral Daily  . atorvastatin  40 mg Oral q1800  . clopidogrel  75 mg Oral Daily  . docusate sodium  100 mg Oral Daily  . insulin aspart  0-9 Units Subcutaneous TID WC  . pantoprazole  40 mg Oral Daily  . sodium chloride flush  3 mL Intravenous Q12H    Deitra Mayo Office: [phone] 01/18/2020

## 2020-01-20 ENCOUNTER — Encounter: Payer: Self-pay | Admitting: *Deleted

## 2020-01-21 DIAGNOSIS — J4550 Severe persistent asthma, uncomplicated: Secondary | ICD-10-CM

## 2020-01-22 ENCOUNTER — Ambulatory Visit (INDEPENDENT_AMBULATORY_CARE_PROVIDER_SITE_OTHER): Payer: Medicare Other | Admitting: *Deleted

## 2020-01-22 ENCOUNTER — Other Ambulatory Visit: Payer: Self-pay

## 2020-01-22 DIAGNOSIS — J4550 Severe persistent asthma, uncomplicated: Secondary | ICD-10-CM | POA: Diagnosis not present

## 2020-01-26 DIAGNOSIS — I10 Essential (primary) hypertension: Secondary | ICD-10-CM | POA: Diagnosis not present

## 2020-01-26 DIAGNOSIS — E7800 Pure hypercholesterolemia, unspecified: Secondary | ICD-10-CM | POA: Diagnosis not present

## 2020-01-31 DIAGNOSIS — I6522 Occlusion and stenosis of left carotid artery: Secondary | ICD-10-CM | POA: Diagnosis not present

## 2020-01-31 DIAGNOSIS — Z7689 Persons encountering health services in other specified circumstances: Secondary | ICD-10-CM | POA: Diagnosis not present

## 2020-01-31 DIAGNOSIS — Z6831 Body mass index (BMI) 31.0-31.9, adult: Secondary | ICD-10-CM | POA: Diagnosis not present

## 2020-01-31 DIAGNOSIS — I63132 Cerebral infarction due to embolism of left carotid artery: Secondary | ICD-10-CM | POA: Diagnosis not present

## 2020-02-03 ENCOUNTER — Other Ambulatory Visit: Payer: Self-pay

## 2020-02-03 ENCOUNTER — Encounter: Payer: Self-pay | Admitting: Allergy and Immunology

## 2020-02-03 ENCOUNTER — Ambulatory Visit (INDEPENDENT_AMBULATORY_CARE_PROVIDER_SITE_OTHER): Payer: Medicare Other | Admitting: Allergy and Immunology

## 2020-02-03 VITALS — BP 128/82 | HR 64 | Temp 97.4°F | Resp 16 | Wt 170.0 lb

## 2020-02-03 DIAGNOSIS — I6523 Occlusion and stenosis of bilateral carotid arteries: Secondary | ICD-10-CM

## 2020-02-03 DIAGNOSIS — Z862 Personal history of diseases of the blood and blood-forming organs and certain disorders involving the immune mechanism: Secondary | ICD-10-CM | POA: Diagnosis not present

## 2020-02-03 DIAGNOSIS — J3089 Other allergic rhinitis: Secondary | ICD-10-CM

## 2020-02-03 DIAGNOSIS — R040 Epistaxis: Secondary | ICD-10-CM | POA: Diagnosis not present

## 2020-02-03 DIAGNOSIS — K219 Gastro-esophageal reflux disease without esophagitis: Secondary | ICD-10-CM

## 2020-02-03 DIAGNOSIS — J4550 Severe persistent asthma, uncomplicated: Secondary | ICD-10-CM | POA: Diagnosis not present

## 2020-02-03 NOTE — Patient Instructions (Addendum)
  1. Continue Xolair and Epi-Pen  2. Continue Trelegy 100 - 1 inhalation 1 time per day  3. Continue Flonase - one spray each nostril 1-2 times per day. Hold off for 10 days and only use nasal saline until bleeding stops  4. Continue omeprazole 40 twice a day    5. Continue nasal saline, ProAir HFA, Duoneb if needed.  6. Can add OTC Loratadine 10 mg one tablet 1-2 times per day if needed  7. Can add OTC Mucinex DM 1-2 tablet 1-2 times per day if needed  8. Can add ipratropium 0.06% nasal spray - 1-2 sprays each nostril every 6 hours if needed  9. Return to clinic in 12 weeks or earlier if problem

## 2020-02-03 NOTE — Progress Notes (Signed)
[facility]   Follow-up Note  Referring Provider: Serita Grammes, MD Primary Provider: Serita Grammes, MD Date of Office Visit: 02/03/2020  Subjective:   [name] (DOB: 1946/04/09) is a 74 y.o. female who returns to the [facility] on 02/03/2020 in re-evaluation of the following:  HPI: [name] returns to this clinic in reevaluation of asthma and history of pulmonary sarcoidosis with apparent fibrotic sequela, and allergic rhinitis and LPR.  Her last visit to this clinic was 11 November 2019.  Overall she feels as though her breathing is doing okay.  Okay for [place] means that about 3 times per day she has a coughing episode that lasts about 1 minute.  She does not use a short acting bronchodilator greater than 1 time per week.  She can exert herself without too much problem.  She is presently using Trelegy 100 as her sole controller medication along with omalizumab injections.  Her nose is doing relatively well.  She has noticed some nasal bleeding when she blows her nose.  She has been using Flonase on a daily basis and occasionally nasal ipratropium.  It should be noted that she is now utilizing Plavix secondary to placement of a left carotid artery stent February 2021.  Her reflux is under very good control at this point in time on omeprazole.  She is no longer using any famotidine.  She has received 1 Moderna vaccination and has an appointment for her second vaccination.  Allergies as of 02/03/2020      Reactions   Codeine Nausea And Vomiting   Sulfa Antibiotics Other (See Comments)   Allergic per pt's mother       Medication List      acetaminophen 325 MG tablet Commonly known as: TYLENOL Take 2 tablets (650 mg total) by mouth every 6 (six) hours as needed for mild pain (or Fever >/= 101).   albuterol 108 (90 Base) MCG/ACT inhaler Commonly known as: VENTOLIN HFA INHALE 2 PUFFS BY MOUTH 4 TIMES A  DAY AS NEEDED   amLODipine 5 MG tablet Commonly known as: NORVASC Take 5 mg by mouth daily.   aspirin EC 81 MG tablet Take 81 mg by mouth daily.   atorvastatin 20 MG tablet Commonly known as: LIPITOR Take 2 tablets (40 mg total) by mouth every evening.   citalopram 20 MG tablet Commonly known as: CELEXA Take 20 mg by mouth daily.   clopidogrel 75 MG tablet Commonly known as: PLAVIX Take 1 tablet (75 mg total) by mouth daily.   dicyclomine 20 MG tablet Commonly known as: BENTYL Take 20 mg by mouth 2 (two) times daily.   EPINEPHrine 0.3 mg/0.3 mL Soaj injection Commonly known as: EPI-PEN USE AS DIRECTED FOR LIFE THREATENING ALLERGIC REACTION   fluticasone 50 MCG/ACT nasal spray Commonly known as: FLONASE USE ONE SPRAY IN EACH NOSTRIL ONCE OR TWICE DAILY   furosemide 20 MG tablet Commonly known as: LASIX Take 20 mg by mouth 2 (two) times daily.   HYDROcodone-acetaminophen 5-325 MG tablet Commonly known as: NORCO/VICODIN Take 1 tablet by mouth every 6 (six) hours as needed for moderate pain.   ipratropium-albuterol 0.5-2.5 (3) MG/3ML Soln Commonly known as: DUONEB Take 3 mLs by nebulization every 6 (six) hours as needed (shortness of breath/wheezing).   levocetirizine 5 MG tablet Commonly known as: XYZAL Take 5 mg by mouth at bedtime.   montelukast 10 MG tablet Commonly known as: SINGULAIR Take 10 mg  by mouth daily.   omalizumab 150 MG injection Commonly known as: XOLAIR Inject 150 mg into the skin every 28 (twenty-eight) days.   omeprazole 40 MG capsule Commonly known as: PRILOSEC TAKE 1 CAPSULE BY MOUTH TWICE A DAY   oxybutynin 15 MG 24 hr tablet Commonly known as: DITROPAN XL Take 15 mg by mouth daily.   pioglitazone 30 MG tablet Commonly known as: ACTOS Take 30 mg by mouth daily.   PROBIOTIC ACIDOPHILUS PO Take 2 capsules by mouth every morning.   PROBIOTIC DAILY PO Take 1 capsule by mouth daily.   telmisartan 80 MG tablet Commonly known as:  MICARDIS Take 80 mg by mouth daily.   Trelegy Ellipta 100-62.5-25 MCG/INH Aepb Generic drug: Fluticasone-Umeclidin-Vilant Inhale 1 puff into the lungs every morning.   Victoza 18 MG/3ML Sopn Generic drug: liraglutide Inject 0.6 mg into the skin every morning.       Past Medical History:  Diagnosis Date  . Allergy   . Anxiety   . Arthritis   . Asthma   . Cataract    bilateral and removed  . Depression   . Diaphragmatic hernia without mention of obstruction or gangrene   . DM (diabetes mellitus) ([place])   . Dyspnea   . Esophageal reflux   . Esophagitis, unspecified   . H/O adenomatous polyp of colon 2011  . Heart murmur    no cardiologist, family Dr. Maryfrances Bunnell  . History of kidney stones   . Hyperlipidemia   . Incontinence of feces   . Irritable bowel syndrome   . Kidney stones   . OSA (obstructive sleep apnea) 09/28/2018  . Sarcoidosis   . Sleep apnea    no cpap now  . Status post dilation of esophageal narrowing   . Unspecified asthma(493.90)   . Unspecified essential hypertension   . Unspecified hearing loss     Past Surgical History:  Procedure Laterality Date  . BLADDER SURGERY    . CATARACT EXTRACTION Bilateral   . HARDWARE REMOVAL Left 01/12/2017   Procedure: HARDWARE REMOVAL left wrist;  Surgeon: Leanora Cover, MD;  Location: [facility];  Service: Orthopedics;  Laterality: Left;  . HEMORRHOID SURGERY    . NASAL SEPTUM SURGERY    . PARTIAL HYSTERECTOMY    . REVERSE SHOULDER ARTHROPLASTY Left 02/22/2018  . REVERSE SHOULDER ARTHROPLASTY Left 02/22/2018   Procedure: LEFT REVERSE SHOULDER ARTHROPLASTY;  Surgeon: Meredith Pel, MD;  Location: [facility];  Service: Orthopedics;  Laterality: Left;  . TONSILLECTOMY AND ADENOIDECTOMY    . TRANSCAROTID ARTERY REVASCULARIZATION Left 01/17/2020   Procedure: TRANSCAROTID ARTERY REVASCULARIZATION;  Surgeon: Serafina Mitchell, MD;  Location: [facility];  Service: Vascular;  Laterality: Left;  . TRIGGER FINGER  RELEASE Left 05/12/2016   Procedure: RELEASE TRIGGER FINGER/A-1 PULLEY INFECTION LEFT RING TENDON SHEATH INJECT RIGHT INDEX FINGER;  Surgeon: Leanora Cover, MD;  Location: [facility];  Service: Orthopedics;  Laterality: Left;  . TUBAL LIGATION      Review of systems negative except as noted in HPI / PMHx or noted below:  Review of Systems  Constitutional: Negative.   HENT: Negative.   Eyes: Negative.   Respiratory: Negative.   Cardiovascular: Negative.   Gastrointestinal: Negative.   Genitourinary: Negative.   Musculoskeletal: Negative.   Skin: Negative.   Neurological: Negative.   Endo/Heme/Allergies: Negative.   Psychiatric/Behavioral: Negative.      Objective:   Vitals:   02/03/20 1047  BP: 128/82  Pulse: 64  Resp: 16  Temp: (!) 97.4 F (36.3 C)  SpO2: 97%      Weight: 170 lb (77.1 kg)   Physical Exam Constitutional:      Appearance: She is not diaphoretic.  HENT:     Head: Normocephalic.     Right Ear: External ear normal.     Left Ear: External ear normal.     Ears:     Comments: Bilateral hearing aids    Nose: Nose normal. No mucosal edema (Excoriated nasal mucosa with bleeding bilaterally) or rhinorrhea.     Mouth/Throat:     Pharynx: Uvula midline. No oropharyngeal exudate.  Eyes:     Conjunctiva/sclera: Conjunctivae normal.  Neck:     Thyroid: No thyromegaly.     Trachea: Trachea normal. No tracheal tenderness or tracheal deviation.  Cardiovascular:     Rate and Rhythm: Normal rate and regular rhythm.     Heart sounds: S1 normal and S2 normal. Murmur (Systolic) present.  Pulmonary:     Effort: No respiratory distress.     Breath sounds: Normal breath sounds. No stridor. No wheezing or rales.  Lymphadenopathy:     Head:     Right side of head: No tonsillar adenopathy.     Left side of head: No tonsillar adenopathy.     Cervical: No cervical adenopathy.  Skin:    Findings: No erythema or rash.     Nails: There is no clubbing.    Neurological:     Mental Status: She is alert.     Diagnostics:    Spirometry was performed and demonstrated an FEV1 of 1.24 at 69 % of predicted.  Assessment and Plan:   1. Asthma, severe persistent, well-controlled   2. History of sarcoidosis   3. Other allergic rhinitis   4. Epistaxis   5. LPRD (laryngopharyngeal reflux disease)     1. Continue Xolair and Epi-Pen  2. Continue Trelegy 100 - 1 inhalation 1 time per day  3. Continue Flonase - one spray each nostril 1-2 times per day. Hold off for 10 days and only use nasal saline until bleeding stops  4. Continue omeprazole 40 twice a day    5. Continue nasal saline, ProAir HFA, Duoneb if needed.  6. Can add OTC Loratadine 10 mg one tablet 1-2 times per day if needed  7. Can add OTC Mucinex DM 1-2 tablet 1-2 times per day if needed  8. Can add ipratropium 0.06% nasal spray - 1-2 sprays each nostril every 6 hours if needed  9. Return to clinic in 12 weeks or earlier if problem  Overall Jhane appears to be doing pretty well regarding her airway issue on a large collection of medical therapy directed against respiratory tract inflammation and reflux including the use of omalizumab injections.  She appears to have nasal mucosal irritation and bleeding and we are going to have her discontinue her Flonase for the next 10 days and only use nasal saline during this timeframe.  Hopefully her bleeding will discontinue with this plan.  We need to be very careful about nasal bleeding especially given the fact that she is now using Plavix.  There are a collection of other medications that she can utilize should they be required.  I will see her back in this clinic in 12 weeks or earlier if there is a problem.  Allena Katz, MD Allergy / Immunology [place]

## 2020-02-04 ENCOUNTER — Encounter: Payer: Self-pay | Admitting: Allergy and Immunology

## 2020-02-14 ENCOUNTER — Telehealth (HOSPITAL_COMMUNITY): Payer: Self-pay

## 2020-02-14 NOTE — Telephone Encounter (Signed)

## 2020-02-17 ENCOUNTER — Encounter: Payer: Self-pay | Admitting: Surgery

## 2020-02-17 ENCOUNTER — Other Ambulatory Visit: Payer: Self-pay

## 2020-02-17 ENCOUNTER — Ambulatory Visit (HOSPITAL_COMMUNITY)
Admission: RE | Admit: 2020-02-17 | Discharge: 2020-02-17 | Disposition: A | Discharge status: Home / Self Care | Payer: Medicare Other | Source: Ambulatory Visit | Attending: Surgery | Admitting: Surgery

## 2020-02-17 ENCOUNTER — Other Ambulatory Visit: Payer: Self-pay | Admitting: *Deleted

## 2020-02-17 ENCOUNTER — Ambulatory Visit (INDEPENDENT_AMBULATORY_CARE_PROVIDER_SITE_OTHER): Payer: Self-pay | Admitting: Surgery

## 2020-02-17 VITALS — BP 155/56 | HR 57 | Temp 97.3°F | Resp 20 | Ht 60.0 in | Wt 169.8 lb

## 2020-02-17 DIAGNOSIS — I6523 Occlusion and stenosis of bilateral carotid arteries: Secondary | ICD-10-CM | POA: Insufficient documentation

## 2020-02-17 NOTE — Progress Notes (Signed)
Patient name: [name] MRN: [id number] DOB: 02/20/1946 Sex: female  REASON FOR VISIT:     post op  HISTORY OF PRESENT ILLNESS:   [name] GIOVANNETTI is a 74 y.o. female who presented to the hospital on 01/13/2020 with weakness, fatigue, and slurred speech.  CT scan showed 70% bilateral carotid stenosis.  The left side appeared to be ulcerated.  The lesion was high and so she underwent left TCAR with flow reversal neuro protection on 01/17/2020.  Intraoperative findings included a 70% stenosis.  She reports no new symptoms.  The patient is medically managed for type 2 diabetes.  She is on a statin for hypercholesterolemia.  She is on multiple medications for hypertension.  She is a non-smoker.  CURRENT MEDICATIONS:    Current Outpatient Medications  Medication Sig Dispense Refill  . acetaminophen (TYLENOL) 325 MG tablet Take 2 tablets (650 mg total) by mouth every 6 (six) hours as needed for mild pain (or Fever >/= 101).    Marland Kitchen albuterol (PROVENTIL HFA;VENTOLIN HFA) 108 (90 Base) MCG/ACT inhaler INHALE 2 PUFFS BY MOUTH 4 TIMES A DAY AS NEEDED (Patient taking differently: Inhale 2 puffs into the lungs 4 (four) times daily as needed for wheezing or shortness of breath. ) 18 Inhaler 1  . amLODipine (NORVASC) 5 MG tablet Take 5 mg by mouth daily.  2  . aspirin EC 81 MG tablet Take 81 mg by mouth daily.    Marland Kitchen atorvastatin (LIPITOR) 20 MG tablet Take 2 tablets (40 mg total) by mouth every evening.    . citalopram (CELEXA) 20 MG tablet Take 20 mg by mouth daily.      . clopidogrel (PLAVIX) 75 MG tablet Take 1 tablet (75 mg total) by mouth daily. 30 tablet 2  . dicyclomine (BENTYL) 20 MG tablet Take 20 mg by mouth 2 (two) times daily.     Marland Kitchen EPINEPHrine 0.3 mg/0.3 mL IJ SOAJ injection USE AS DIRECTED FOR LIFE THREATENING ALLERGIC REACTION    . fluticasone (FLONASE) 50 MCG/ACT nasal spray USE ONE SPRAY IN EACH NOSTRIL ONCE OR TWICE DAILY (Patient taking  differently: Place 2 sprays into both nostrils every morning. ) 16 g 5  . Fluticasone-Umeclidin-Vilant (TRELEGY ELLIPTA) 100-62.5-25 MCG/INH AEPB Inhale 1 puff into the lungs every morning.    . furosemide (LASIX) 20 MG tablet Take 20 mg by mouth 2 (two) times daily.     Marland Kitchen HYDROcodone-acetaminophen (NORCO/VICODIN) 5-325 MG tablet Take 1 tablet by mouth every 6 (six) hours as needed for moderate pain. 5 tablet 0  . ipratropium-albuterol (DUONEB) 0.5-2.5 (3) MG/3ML SOLN Take 3 mLs by nebulization every 6 (six) hours as needed (shortness of breath/wheezing).    . Lactobacillus (PROBIOTIC ACIDOPHILUS PO) Take 2 capsules by mouth every morning.    Marland Kitchen levocetirizine (XYZAL) 5 MG tablet Take 5 mg by mouth at bedtime.     . liraglutide (VICTOZA) 18 MG/3ML SOPN Inject 0.6 mg into the skin every morning.     . montelukast (SINGULAIR) 10 MG tablet Take 10 mg by mouth daily.    Marland Kitchen omalizumab (XOLAIR) 150 MG injection Inject 150 mg into the skin every 28 (twenty-eight) days.     Marland Kitchen omeprazole (PRILOSEC) 40 MG capsule TAKE 1 CAPSULE BY MOUTH TWICE A DAY (Patient taking differently: Take 40 mg by mouth 2 (two) times daily. ) 60 capsule 5  . oxybutynin (DITROPAN XL) 15 MG 24 hr tablet Take 15 mg by mouth daily.     . pioglitazone (ACTOS)  30 MG tablet Take 30 mg by mouth daily.    . Probiotic Product (PROBIOTIC DAILY PO) Take 1 capsule by mouth daily.     Marland Kitchen telmisartan (MICARDIS) 80 MG tablet Take 80 mg by mouth daily.     Current Facility-Administered Medications  Medication Dose Route Frequency Provider Last Rate Last Admin  . omalizumab Arvid Right) injection 150 mg  150 mg Subcutaneous Q28 days Jiles Prows, MD   150 mg at 01/22/20 1400    REVIEW OF SYSTEMS:   [X]  denotes positive finding, [ ]  denotes negative finding Cardiac  Comments:  Chest pain or chest pressure:    Shortness of breath upon exertion:    Short of breath when lying flat:    Irregular heart rhythm:    Constitutional    Fever or chills:       PHYSICAL EXAM:   There were no vitals filed for this visit.  GENERAL: The patient is a well-nourished female, in no acute distress. The vital signs are documented above. CARDIOVASCULAR: There is a regular rate and rhythm. PULMONARY: Non-labored respirations Incision is healing nicely Neurologically intact  STUDIES:   I have reviewed her duplex with the following findings: Summary:  Right Carotid: Velocities in the right ICA are consistent with a 40-59%         stenosis.   Left Carotid: Patent stent with velocities in the left ICA consistent with  a        1-39% stenosis.   Vertebrals: Bilateral vertebral arteries demonstrate antegrade flow.  Subclavians: Normal flow hemodynamics were seen in bilateral subclavian        arteries.   MEDICAL ISSUES:   Symptomatic left carotid stenosis: By duplex, the left carotid stent is widely patent.  She has not had any new neurological symptoms.  I will have her follow-up in 6 months with a repeat carotid ultrasound.  She will be able to stop her Plavix and continue aspirin in 6 months if she has not had any issues.  Leia Alf, MD, FACS [facility] [phone] Pager [phone]

## 2020-02-18 ENCOUNTER — Other Ambulatory Visit: Payer: Self-pay | Admitting: *Deleted

## 2020-02-18 DIAGNOSIS — J4550 Severe persistent asthma, uncomplicated: Secondary | ICD-10-CM | POA: Diagnosis not present

## 2020-02-18 DIAGNOSIS — I6523 Occlusion and stenosis of bilateral carotid arteries: Secondary | ICD-10-CM

## 2020-02-19 ENCOUNTER — Other Ambulatory Visit: Payer: Self-pay

## 2020-02-19 ENCOUNTER — Ambulatory Visit (INDEPENDENT_AMBULATORY_CARE_PROVIDER_SITE_OTHER): Payer: Medicare Other | Admitting: *Deleted

## 2020-02-19 DIAGNOSIS — J4550 Severe persistent asthma, uncomplicated: Secondary | ICD-10-CM

## 2020-02-26 DIAGNOSIS — I10 Essential (primary) hypertension: Secondary | ICD-10-CM | POA: Diagnosis not present

## 2020-02-26 DIAGNOSIS — E119 Type 2 diabetes mellitus without complications: Secondary | ICD-10-CM | POA: Diagnosis not present

## 2020-03-03 ENCOUNTER — Emergency Department (HOSPITAL_COMMUNITY)
Admission: EM | Admit: 2020-03-03 | Discharge: 2020-03-04 | Disposition: A | Discharge status: Home / Self Care | Payer: Medicare Other | Attending: Emergency Medicine | Admitting: Emergency Medicine

## 2020-03-03 ENCOUNTER — Other Ambulatory Visit: Payer: Self-pay

## 2020-03-03 ENCOUNTER — Encounter (HOSPITAL_COMMUNITY): Payer: Self-pay | Admitting: Emergency Medicine

## 2020-03-03 ENCOUNTER — Emergency Department (HOSPITAL_COMMUNITY): Payer: Medicare Other

## 2020-03-03 DIAGNOSIS — M7541 Impingement syndrome of right shoulder: Secondary | ICD-10-CM | POA: Insufficient documentation

## 2020-03-03 DIAGNOSIS — M25511 Pain in right shoulder: Secondary | ICD-10-CM | POA: Diagnosis not present

## 2020-03-03 DIAGNOSIS — I10 Essential (primary) hypertension: Secondary | ICD-10-CM | POA: Diagnosis not present

## 2020-03-03 DIAGNOSIS — Z79899 Other long term (current) drug therapy: Secondary | ICD-10-CM | POA: Diagnosis not present

## 2020-03-03 DIAGNOSIS — M79601 Pain in right arm: Secondary | ICD-10-CM | POA: Diagnosis not present

## 2020-03-03 DIAGNOSIS — Z7982 Long term (current) use of aspirin: Secondary | ICD-10-CM | POA: Insufficient documentation

## 2020-03-03 DIAGNOSIS — E119 Type 2 diabetes mellitus without complications: Secondary | ICD-10-CM | POA: Insufficient documentation

## 2020-03-03 DIAGNOSIS — J189 Pneumonia, unspecified organism: Secondary | ICD-10-CM | POA: Diagnosis not present

## 2020-03-03 DIAGNOSIS — J309 Allergic rhinitis, unspecified: Secondary | ICD-10-CM | POA: Diagnosis not present

## 2020-03-03 DIAGNOSIS — Z8709 Personal history of other diseases of the respiratory system: Secondary | ICD-10-CM | POA: Diagnosis not present

## 2020-03-03 DIAGNOSIS — J4540 Moderate persistent asthma, uncomplicated: Secondary | ICD-10-CM | POA: Diagnosis not present

## 2020-03-03 DIAGNOSIS — R079 Chest pain, unspecified: Secondary | ICD-10-CM | POA: Diagnosis not present

## 2020-03-03 DIAGNOSIS — E669 Obesity, unspecified: Secondary | ICD-10-CM | POA: Diagnosis not present

## 2020-03-03 DIAGNOSIS — Z7901 Long term (current) use of anticoagulants: Secondary | ICD-10-CM | POA: Diagnosis not present

## 2020-03-03 LAB — BASIC METABOLIC PANEL
Anion gap: 9 (ref 5–15)
BUN: 23 mg/dL (ref 8–23)
CO2: 23 mmol/L (ref 22–32)
Calcium: 8.9 mg/dL (ref 8.9–10.3)
Chloride: 106 mmol/L (ref 98–111)
Creatinine, Ser: 1.02 mg/dL — ABNORMAL HIGH (ref 0.44–1.00)
GFR calc Af Amer: >60 mL/min (ref >60)
GFR calc non Af Amer: 55 mL/min — ABNORMAL LOW (ref >60)
Glucose, Bld: 113 mg/dL — ABNORMAL HIGH (ref 70–99)
Potassium: 4.2 mmol/L (ref 3.5–5.1)
Sodium: 138 mmol/L (ref 135–145)

## 2020-03-03 LAB — CBC
HCT: 33.3 % — ABNORMAL LOW (ref 36.0–46.0)
Hemoglobin: 10.7 g/dL — ABNORMAL LOW (ref 12.0–15.0)
MCH: 28.5 pg (ref 26.0–34.0)
MCHC: 32.1 g/dL (ref 30.0–36.0)
MCV: 88.8 fL (ref 80.0–100.0)
Platelets: 289 10*3/uL (ref 150–400)
RBC: 3.75 MIL/uL — ABNORMAL LOW (ref 3.87–5.11)
RDW: 17.4 % — ABNORMAL HIGH (ref 11.5–15.5)
WBC: 10.6 10*3/uL — ABNORMAL HIGH (ref 4.0–10.5)
nRBC: 0 % (ref 0.0–0.2)

## 2020-03-03 LAB — TROPONIN I (HIGH SENSITIVITY): Troponin I (High Sensitivity): 7 ng/L (ref <18)

## 2020-03-03 MED ORDER — SODIUM CHLORIDE 0.9% FLUSH: 3.0000 mL | Freq: Once | INTRAVENOUS | Status: DC

## 2020-03-03 NOTE — ED Triage Notes (Signed)
Pt c/o right arm pain 8/10 since yesterday denies any injury.

## 2020-03-04 ENCOUNTER — Emergency Department (HOSPITAL_COMMUNITY): Payer: Medicare Other

## 2020-03-04 DIAGNOSIS — M79601 Pain in right arm: Secondary | ICD-10-CM | POA: Diagnosis not present

## 2020-03-04 DIAGNOSIS — M7541 Impingement syndrome of right shoulder: Secondary | ICD-10-CM | POA: Diagnosis not present

## 2020-03-04 LAB — TROPONIN I (HIGH SENSITIVITY): Troponin I (High Sensitivity): 7 ng/L (ref <18)

## 2020-03-04 MED ORDER — IBUPROFEN 400 MG PO TABS
400.0000 mg | ORAL_TABLET | Freq: Once | ORAL | Status: AC
  Administered 2020-03-04: 06:00:00 400 mg via ORAL
  Filled 2020-03-04: qty 1

## 2020-03-04 MED ORDER — ACETAMINOPHEN 325 MG PO TABS
650.0000 mg | ORAL_TABLET | Freq: Once | ORAL | Status: AC
  Administered 2020-03-04: 06:00:00 650 mg via ORAL
  Filled 2020-03-04: qty 2

## 2020-03-04 MED ORDER — LIDOCAINE 5 % EX PTCH
1.0000 | MEDICATED_PATCH | CUTANEOUS | Status: DC
  Administered 2020-03-04: 06:00:00 1 via TRANSDERMAL
  Filled 2020-03-04: qty 1

## 2020-03-04 MED ORDER — LIDOCAINE 4 % EX PTCH: 1.0000 | MEDICATED_PATCH | Freq: Two times a day (BID) | CUTANEOUS | 0 refills | Status: DC

## 2020-03-04 NOTE — ED Notes (Signed)
Patient transported to X-ray 

## 2020-03-04 NOTE — Discharge Instructions (Signed)
You are seen in the ER for shoulder pain. The work-up in the ER is overall reassuring.  We suspect that you likely have shoulder impingement syndrome and recommend that you follow-up with the orthopedist in 7 to 10 days.  Please ice the shoulder and take over-the-counter pain medications every 6 hours.  Apply lidocaine patch for further relief.

## 2020-03-04 NOTE — ED Provider Notes (Signed)
[facility] EMERGENCY DEPARTMENT Provider Note   CSN: [id number] Arrival date & time: 03/03/20  2041     History Chief Complaint  Patient presents with  . Arm Pain    [name] is a 74 y.o. female.  HPI    74 year old female comes in a chief complaint of arm pain.  Patient reports that she started having right arm pain yesterday morning when she woke up.  The pain is described as sharp pain that is worse with any kind of movement.  She denies any specific precipitating factor.  There is no associated numbness, tingling, neck pain. No n/v/f/c.   Past Medical History:  Diagnosis Date  . Allergy   . Anxiety   . Arthritis   . Asthma   . Cataract    bilateral and removed  . Depression   . Diaphragmatic hernia without mention of obstruction or gangrene   . DM (diabetes mellitus) ([place])   . Dyspnea   . Esophageal reflux   . Esophagitis, unspecified   . H/O adenomatous polyp of colon 2011  . Heart murmur    no cardiologist, family Dr. Maryfrances Bunnell  . History of kidney stones   . Hyperlipidemia   . Incontinence of feces   . Irritable bowel syndrome   . Kidney stones   . OSA (obstructive sleep apnea) 09/28/2018  . Sarcoidosis   . Sleep apnea    no cpap now  . Status post dilation of esophageal narrowing   . Unspecified asthma(493.90)   . Unspecified essential hypertension   . Unspecified hearing loss     Patient Active Problem List   Diagnosis Date Noted  . Asthma 01/16/2020  . History of transient ischemic attack (TIA) 01/16/2020  . Bilateral carotid artery stenosis 01/16/2020  . Normocytic anemia 01/16/2020  . Diabetes mellitus type II, non insulin dependent ([place]) 07/05/2019  . Mild aortic stenosis 07/05/2019  . Mitral regurgitation 07/05/2019  . Pedal edema 05/06/2019  . DOE (dyspnea on exertion) 05/06/2019  . Hearing loss, mixed, bilateral 12/17/2018  . Bilateral primary osteoarthritis of knee 10/16/2018  . OSA (obstructive sleep apnea)  09/28/2018  . Shoulder arthritis 02/22/2018  . Primary osteoarthritis, left shoulder 12/11/2017  . DDD (degenerative disc disease), lumbar 01/23/2017  . History of diabetes mellitus 01/23/2017  . History of depression 01/23/2017  . History of asthma 01/23/2017  . Dyslipidemia 01/23/2017  . Age-related osteoporosis without current pathological fracture 01/23/2017  . History of gastroesophageal reflux (GERD) 01/17/2017  . Primary osteoarthritis of both knees 01/17/2017  . Primary osteoarthritis of both feet 01/17/2017  . Vitamin D deficiency 01/17/2017  . Rheumatoid factor positive 12/13/2016  . Primary osteoarthritis of both hands 12/13/2016  . Retained orthopedic hardware 11/14/2016  . Pain in left wrist 06/06/2016  . Stiffness of left wrist joint 06/06/2016  . Closed fracture of distal end of radius 06/05/2016  . Acquired trigger finger 04/20/2016  . Obstructive lung disease ([place]) 07/29/2015  . H/O allergic rhinitis 07/29/2015  . LPRD (laryngopharyngeal reflux disease) 07/29/2015  . Chest pain 11/01/2013  . Murmur 11/01/2013  . DM 03/03/2010  . ABDOMINAL PAIN RIGHT LOWER QUADRANT 03/03/2010  . FULL INCONTINENCE OF FECES 03/03/2010  . DECREASED HEARING 05/02/2008  . Essential hypertension 05/02/2008  . ESOPHAGITIS 05/02/2008  . HIATAL HERNIA 05/02/2008  . RECTAL INCONTINENCE 05/02/2008  . SARCOIDOSIS 11/16/2007  . Severe persistent asthma 11/16/2007  . GERD 11/16/2007  . IRRITABLE BOWEL SYNDROME 11/16/2007    Past Surgical History:  Procedure Laterality Date  . BLADDER SURGERY    . CATARACT EXTRACTION Bilateral   . HARDWARE REMOVAL Left 01/12/2017   Procedure: HARDWARE REMOVAL left wrist;  Surgeon: Leanora Cover, MD;  Location: [place];  Service: Orthopedics;  Laterality: Left;  . HEMORRHOID SURGERY    . NASAL SEPTUM SURGERY    . PARTIAL HYSTERECTOMY    . REVERSE SHOULDER ARTHROPLASTY Left 02/22/2018  . REVERSE SHOULDER ARTHROPLASTY Left 02/22/2018    Procedure: LEFT REVERSE SHOULDER ARTHROPLASTY;  Surgeon: Meredith Pel, MD;  Location: [place];  Service: Orthopedics;  Laterality: Left;  . TONSILLECTOMY AND ADENOIDECTOMY    . TRANSCAROTID ARTERY REVASCULARIZATION Left 01/17/2020   Procedure: TRANSCAROTID ARTERY REVASCULARIZATION;  Surgeon: Serafina Mitchell, MD;  Location: [facility] OR;  Service: Vascular;  Laterality: Left;  . TRIGGER FINGER RELEASE Left 05/12/2016   Procedure: RELEASE TRIGGER FINGER/A-1 PULLEY INFECTION LEFT RING TENDON SHEATH INJECT RIGHT INDEX FINGER;  Surgeon: Leanora Cover, MD;  Location: [place];  Service: Orthopedics;  Laterality: Left;  . TUBAL LIGATION       OB History   No obstetric history on file.     Family History  Problem Relation Age of Onset  . Esophageal cancer Father   . Diabetes Mother   . Diabetes Maternal Grandmother   . Colon cancer Neg Hx   . Rectal cancer Neg Hx   . Stomach cancer Neg Hx     Social History   Tobacco Use  . Smoking status: Never Smoker  . Smokeless tobacco: Never Used  Substance Use Topics  . Alcohol use: No  . Drug use: No    Home Medications Prior to Admission medications   Medication Sig Start Date End Date Taking? Authorizing Provider  acetaminophen (TYLENOL) 325 MG tablet Take 2 tablets (650 mg total) by mouth every 6 (six) hours as needed for mild pain (or Fever >/= 101). 01/18/20   Domenic Polite, MD  albuterol (PROVENTIL HFA;VENTOLIN HFA) 108 (90 Base) MCG/ACT inhaler INHALE 2 PUFFS BY MOUTH 4 TIMES A DAY AS NEEDED Patient taking differently: Inhale 2 puffs into the lungs 4 (four) times daily as needed for wheezing or shortness of breath.  08/23/18   Kozlow, Donnamarie Poag, MD  amLODipine (NORVASC) 5 MG tablet Take 5 mg by mouth daily. 06/21/18   [provider]  aspirin EC 81 MG tablet Take 81 mg by mouth daily.    [provider]  atorvastatin (LIPITOR) 20 MG tablet Take 2 tablets (40 mg total) by mouth every evening. 01/18/20    Domenic Polite, MD  citalopram (CELEXA) 20 MG tablet Take 20 mg by mouth daily.      [provider]  clopidogrel (PLAVIX) 75 MG tablet Take 1 tablet (75 mg total) by mouth daily. 01/18/20   Domenic Polite, MD  dicyclomine (BENTYL) 20 MG tablet Take 20 mg by mouth 2 (two) times daily.  07/12/19   [provider]  EPINEPHrine 0.3 mg/0.3 mL IJ SOAJ injection USE AS DIRECTED FOR LIFE THREATENING ALLERGIC REACTION 10/26/15   [provider]  fluticasone (FLONASE) 50 MCG/ACT nasal spray USE ONE SPRAY IN EACH NOSTRIL ONCE OR TWICE DAILY Patient taking differently: Place 2 sprays into both nostrils every morning.  01/25/19   Kozlow, Donnamarie Poag, MD  Fluticasone-Umeclidin-Vilant (TRELEGY ELLIPTA) 100-62.5-25 MCG/INH AEPB Inhale 1 puff into the lungs every morning.    [provider]  furosemide (LASIX) 20 MG tablet Take 20 mg by mouth 2 (two) times  daily.  07/22/19   [provider]  HYDROcodone-acetaminophen (NORCO/VICODIN) 5-325 MG tablet Take 1 tablet by mouth every 6 (six) hours as needed for moderate pain. 01/18/20   Setzer, Edman Circle, PA-C  ipratropium-albuterol (DUONEB) 0.5-2.5 (3) MG/3ML SOLN Take 3 mLs by nebulization every 6 (six) hours as needed (shortness of breath/wheezing).    [provider]  Lactobacillus (PROBIOTIC ACIDOPHILUS PO) Take 2 capsules by mouth every morning.    [provider]  levocetirizine (XYZAL) 5 MG tablet Take 5 mg by mouth at bedtime.  01/31/19   [provider]  Lidocaine 4 % PTCH Apply 1 patch topically 2 (two) times daily. 03/04/20   Varney Biles, MD  liraglutide (VICTOZA) 18 MG/3ML SOPN Inject 0.6 mg into the skin every morning.     [provider]  montelukast (SINGULAIR) 10 MG tablet Take 10 mg by mouth daily. 02/20/19   [provider]  omalizumab Arvid Right) 150 MG injection Inject 150 mg into the skin every 28 (twenty-eight) days.  08/25/15   [provider]  omeprazole  (PRILOSEC) 40 MG capsule TAKE 1 CAPSULE BY MOUTH TWICE A DAY Patient taking differently: Take 40 mg by mouth 2 (two) times daily.  07/01/19   Kozlow, Donnamarie Poag, MD  oxybutynin (DITROPAN XL) 15 MG 24 hr tablet Take 15 mg by mouth daily.  07/23/19   [provider]  pioglitazone (ACTOS) 30 MG tablet Take 30 mg by mouth daily.    [provider]  Probiotic Product (PROBIOTIC DAILY PO) Take 1 capsule by mouth daily.     [provider]  telmisartan (MICARDIS) 80 MG tablet Take 80 mg by mouth daily. 02/20/19   [provider]    Allergies    Codeine and Sulfa antibiotics  Review of Systems   Review of Systems  Constitutional: Positive for activity change. Negative for fever.  Musculoskeletal: Positive for arthralgias.  Skin: Negative for rash.  Neurological: Negative for numbness.    Physical Exam Updated Vital Signs BP (!) 155/63   Pulse 77   Temp 98.5 F (36.9 C) (Oral)   Resp 18   Ht 5\' 1"  (1.549 m)   Wt 77.1 kg   SpO2 97%   BMI 32.12 kg/m   Physical Exam Vitals and nursing note reviewed.  Constitutional:      Appearance: She is well-developed.  HENT:     Head: Normocephalic and atraumatic.  Cardiovascular:     Rate and Rhythm: Normal rate.  Pulmonary:     Effort: Pulmonary effort is normal.  Abdominal:     General: Bowel sounds are normal.  Musculoskeletal:        General: Tenderness present. No swelling.     Cervical back: Normal range of motion and neck supple.     Comments: Pt has tenderness with passive ROM of the R shoulder. She is unable to left the shoulder due to pain. Tenderness with pronation and supination.   No midline c-spine tenderness, pt able to turn head to 45 degrees bilaterally without any pain and able to flex neck to the chest and extend without any pain or neurologic symptoms.   Skin:    General: Skin is warm and dry.     Findings: No erythema.  Neurological:     Mental Status: She is alert and oriented to  person, place, and time.     ED Results / Procedures / Treatments   Labs (all labs ordered are listed, but only abnormal results are  displayed) Labs Reviewed  BASIC METABOLIC PANEL - Abnormal; Notable for the following components:      Result Value   Glucose, Bld 113 (*)    Creatinine, Ser 1.02 (*)    GFR calc non Af Amer 55 (*)    All other components within normal limits  CBC - Abnormal; Notable for the following components:   WBC 10.6 (*)    RBC 3.75 (*)    Hemoglobin 10.7 (*)    HCT 33.3 (*)    RDW 17.4 (*)    All other components within normal limits  TROPONIN I (HIGH SENSITIVITY)  TROPONIN I (HIGH SENSITIVITY)    EKG EKG Interpretation  Date/Time:  Tuesday March 03 2020 21:10:48 EDT Ventricular Rate:  70 PR Interval:  128 QRS Duration: 80 QT Interval:  388 QTC Calculation: 419 R Axis:   61 Text Interpretation: Normal sinus rhythm Cannot rule out Anterior infarct , age undetermined Abnormal ECG When compared with ECG of 02/16/2018, No significant change was found Confirmed by Delora Fuel ([id number]) on 03/03/2020 11:34:45 PM   Radiology DG Chest 2 View  Result Date: 03/03/2020 CLINICAL DATA:  Chest pain EXAM: CHEST - 2 VIEW COMPARISON:  01/13/2020 FINDINGS: Heart is normal size. No confluent opacities or effusions. No acute bony abnormality. Prior left shoulder replacement. IMPRESSION: No active cardiopulmonary disease. Electronically Signed   By: Rolm Baptise M.D.   On: 03/03/2020 21:46   DG Shoulder Right  Result Date: 03/04/2020 CLINICAL DATA:  Right arm pain since yesterday, atraumatic EXAM: RIGHT SHOULDER - 2+ VIEW COMPARISON:  None. FINDINGS: There is no evidence of fracture or dislocation. Trace AC joint spurring superiorly. No glenohumeral spurring or joint narrowing. IMPRESSION: Negative. Electronically Signed   By: Monte Fantasia M.D.   On: 03/04/2020 05:55    Procedures Procedures (including critical care time)  Medications Ordered in ED Medications    sodium chloride flush (NS) 0.9 % injection 3 mL (has no administration in time range)  lidocaine (LIDODERM) 5 % 1 patch (1 patch Transdermal Patch Applied 03/04/20 0532)  ibuprofen (ADVIL) tablet 400 mg (400 mg Oral Given 03/04/20 0530)  acetaminophen (TYLENOL) tablet 650 mg (650 mg Oral Given 03/04/20 0530)    ED Course  I have reviewed the triage vital signs and the nursing notes.  Pertinent labs & imaging results that were available during my care of the patient were reviewed by me and considered in my medical decision making (see chart for details).    MDM Rules/Calculators/A&P                      Pt comes in with cc of shoulder pain. Pt has R shoulder pain.  Differential diagnosis includes shoulder impingement syndrome, cervical spine stenosis, septic arthritis. On exam she has reproducible tenderness of the right shoulder along with tenderness with passive range of motion and pronation plus supination.  Elbow and wrist exam and range of motion is completely normal.  She has no constitutional's concerning for infection.  Blood work is reassuring.  Doubt septic arthritis given that the symptoms that started within the last 24 hours.  X-rays ordered and they are overall reassuring.  We have advised her to follow-up with orthopedist and recommended RICE treatment for now.  Final Clinical Impression(s) / ED Diagnoses Final diagnoses:  Impingement syndrome of right shoulder    Rx / DC Orders ED Discharge Orders         Ordered    Lidocaine 4 %  PTCH  2 times daily     03/04/20 0700           Varney Biles, MD 03/04/20 [id number]

## 2020-03-12 ENCOUNTER — Encounter: Payer: Self-pay | Admitting: Surgical

## 2020-03-12 ENCOUNTER — Telehealth: Payer: Self-pay

## 2020-03-12 ENCOUNTER — Ambulatory Visit: Payer: Self-pay

## 2020-03-12 ENCOUNTER — Other Ambulatory Visit: Payer: Self-pay

## 2020-03-12 ENCOUNTER — Ambulatory Visit (INDEPENDENT_AMBULATORY_CARE_PROVIDER_SITE_OTHER): Payer: Medicare Other | Admitting: Surgical

## 2020-03-12 DIAGNOSIS — M25562 Pain in left knee: Secondary | ICD-10-CM | POA: Diagnosis not present

## 2020-03-12 DIAGNOSIS — M545 Low back pain, unspecified: Secondary | ICD-10-CM

## 2020-03-12 DIAGNOSIS — M48062 Spinal stenosis, lumbar region with neurogenic claudication: Secondary | ICD-10-CM | POA: Diagnosis not present

## 2020-03-12 DIAGNOSIS — M25561 Pain in right knee: Secondary | ICD-10-CM

## 2020-03-12 DIAGNOSIS — M1711 Unilateral primary osteoarthritis, right knee: Secondary | ICD-10-CM | POA: Diagnosis not present

## 2020-03-12 DIAGNOSIS — M1712 Unilateral primary osteoarthritis, left knee: Secondary | ICD-10-CM | POA: Diagnosis not present

## 2020-03-12 NOTE — Telephone Encounter (Signed)
Submitted for VOB for Gel One -Bilateral knee  °

## 2020-03-12 NOTE — Telephone Encounter (Signed)
Can we get patient approved for bilateral knee gel injections? Thanks.  

## 2020-03-12 NOTE — Telephone Encounter (Signed)
Last injection was 09/2018, ok to proceed

## 2020-03-15 ENCOUNTER — Encounter: Payer: Self-pay | Admitting: Surgical

## 2020-03-15 DIAGNOSIS — M1712 Unilateral primary osteoarthritis, left knee: Secondary | ICD-10-CM | POA: Diagnosis not present

## 2020-03-15 MED ORDER — BUPIVACAINE HCL 0.25 % IJ SOLN
4.0000 mL | INTRAMUSCULAR | Status: AC | PRN
  Administered 2020-03-15: 15:00:00 4 mL via INTRA_ARTICULAR

## 2020-03-15 MED ORDER — LIDOCAINE HCL 1 % IJ SOLN
5.0000 mL | INTRAMUSCULAR | Status: AC | PRN
  Administered 2020-03-15: 15:00:00 5 mL

## 2020-03-15 MED ORDER — METHYLPREDNISOLONE ACETATE 40 MG/ML IJ SUSP
40.0000 mg | INTRAMUSCULAR | Status: AC | PRN
  Administered 2020-03-15: 40 mg via INTRA_ARTICULAR

## 2020-03-15 NOTE — Progress Notes (Signed)
Office Visit Note   Patient: [name]           Date of Birth: [birth year]           MRN: [id number] Visit Date: 03/12/2020 Requested by: Serita Grammes, MD [address] [place],  [place] [id number] PCP: Serita Grammes, MD  Subjective: Chief Complaint  Patient presents with  . Right Knee - Pain  . Left Knee - Pain    HPI: [name] is a 74 y.o. female who presents to the office complaining of bilateral knee pain.  Patient reports bilateral knee pain for the last several months.  She has had previous right knee injection 6 weeks ago by her primary care doctor that gave her 3 weeks of relief.  Currently her left knee is bothering her more than her right.  She admits to some groin pain with some lateral hip pain as well as back pain and radicular pain that radiates down the posterior lateral thigh and stops at her knee in both legs.  She admits to knee instability.  Denies any mechanical symptoms.  She has some occasional swelling of the knees.  She describes posterior calf pain that bothers her more with standing and walking.  This calf pain is especially bothersome when walking for long periods of time such as a grocery store.  Pain is relieved when she leans against the grocery store cart.  She has a history of stroke and she is on Plavix..                ROS:  All systems reviewed are negative as they relate to the chief complaint within the history of present illness.  Patient denies fevers or chills.  Assessment & Plan: Visit Diagnoses:  1. Spinal stenosis of lumbar region with neurogenic claudication   2. Pain in both knees, unspecified chronicity   3. Low back pain, unspecified back pain laterality, unspecified chronicity, unspecified whether sciatica present   4. Unilateral primary osteoarthritis, left knee   5. Unilateral primary osteoarthritis, right knee     Plan: Patient is a 74 year old female who presents complaint of bilateral knee pain.  Radiographs  of the bilateral knees reveal mild to moderate joint space narrowing of the medial compartment but no fracture or dislocation.  Administered cortisone injection to the left knee, which patient tolerated well.  We will preapproved her for bilateral gel injections for when these cortisone injections wear off.  As for her back pain, she has tenderness throughout the axial lumbar spine as well as along the paraspinal musculature bilaterally.  She has positive straight leg raise bilaterally.  Her symptoms including the calf pain given impression of spinal stenosis.  Ordered MRI of the lumbar spine for further evaluation.  Radiographs of the lumbar spine taken today are negative for any acute injury such as fracture.  Patient will follow-up after MRI to review results.  This patient is diagnosed with osteoarthritis of the knee(s).    Radiographs show evidence of joint space narrowing, osteophytes, subchondral sclerosis and/or subchondral cysts.  This patient has knee pain which interferes with functional and activities of daily living.    This patient has experienced inadequate response, adverse effects and/or intolerance with conservative treatments such as acetaminophen, NSAIDS, topical creams, physical therapy or regular exercise, knee bracing and/or weight loss.   This patient has experienced inadequate response or has a contraindication to intra articular steroid injections for at least 3 months.   This patient is  not scheduled to have a total knee replacement within 6 months of starting treatment with viscosupplementation.   Follow-Up Instructions: No follow-ups on file.   Orders:  Orders Placed This Encounter  Procedures  . XR Knee 1-2 Views Right  . XR KNEE 3 VIEW LEFT  . XR Lumbar Spine 2-3 Views  . MR Lumbar Spine w/o contrast   No orders of the defined types were placed in this encounter.     Procedures: Large Joint Inj: L knee on 03/15/2020 2:33 PM Indications: diagnostic  evaluation, joint swelling and pain Details: 18 G 1.5 in needle, superolateral approach  Arthrogram: No  Medications: 5 mL lidocaine 1 %; 40 mg methylPREDNISolone acetate 40 MG/ML; 4 mL bupivacaine 0.25 % Outcome: tolerated well, no immediate complications Procedure, treatment alternatives, risks and benefits explained, specific risks discussed. Consent was given by the patient. Immediately prior to procedure a time out was called to verify the correct patient, procedure, equipment, support staff and site/side marked as required. Patient was prepped and draped in the usual sterile fashion.       Clinical Data: No additional findings.  Objective: Vital Signs: There were no vitals taken for this visit.  Physical Exam:  Constitutional: Patient appears well-developed HEENT:  Head: Normocephalic Eyes:EOM are normal Neck: Normal range of motion Cardiovascular: Normal rate Pulmonary/chest: Effort normal Neurologic: Patient is alert Skin: Skin is warm Psychiatric: Patient has normal mood and affect  Ortho Exam:  Left knee Exam No effusion Tender to palpation over the medial and lateral joint lines Extensor mechanism intact  No TTP over the quad tendon, patellar tendon, pes anserinus, patella, tibial tubercle, LCL/MCL insertions Stable to varus/valgus stresses.  Stable to anterior/posterior drawer Extension to 0 degrees Flexion > 90 degrees  Tender to palpation over the axial lumbar spine and bilateral paraspinal musculature.  Positive straight leg raise bilaterally.  5/5 motor strength of the bilateral hip flexors, quadriceps, hamstring, dorsiflexion, plantarflexion.  Sensation intact through all dermatomes of the bilateral lower extremities.  No evidence of hypo or hyperreflexia.  Specialty Comments:  No specialty comments available.  Imaging: No results found.   PMFS History: Patient Active Problem List   Diagnosis Date Noted  . Asthma 01/16/2020  . History of  transient ischemic attack (TIA) 01/16/2020  . Bilateral carotid artery stenosis 01/16/2020  . Normocytic anemia 01/16/2020  . Diabetes mellitus type II, non insulin dependent ([place]) 07/05/2019  . Mild aortic stenosis 07/05/2019  . Mitral regurgitation 07/05/2019  . Pedal edema 05/06/2019  . DOE (dyspnea on exertion) 05/06/2019  . Hearing loss, mixed, bilateral 12/17/2018  . Bilateral primary osteoarthritis of knee 10/16/2018  . OSA (obstructive sleep apnea) 09/28/2018  . Shoulder arthritis 02/22/2018  . Primary osteoarthritis, left shoulder 12/11/2017  . DDD (degenerative disc disease), lumbar 01/23/2017  . History of diabetes mellitus 01/23/2017  . History of depression 01/23/2017  . History of asthma 01/23/2017  . Dyslipidemia 01/23/2017  . Age-related osteoporosis without current pathological fracture 01/23/2017  . History of gastroesophageal reflux (GERD) 01/17/2017  . Primary osteoarthritis of both knees 01/17/2017  . Primary osteoarthritis of both feet 01/17/2017  . Vitamin D deficiency 01/17/2017  . Rheumatoid factor positive 12/13/2016  . Primary osteoarthritis of both hands 12/13/2016  . Retained orthopedic hardware 11/14/2016  . Pain in left wrist 06/06/2016  . Stiffness of left wrist joint 06/06/2016  . Closed fracture of distal end of radius 06/05/2016  . Acquired trigger finger 04/20/2016  . Obstructive lung disease ([place]) 07/29/2015  .  H/O allergic rhinitis 07/29/2015  . LPRD (laryngopharyngeal reflux disease) 07/29/2015  . Chest pain 11/01/2013  . Murmur 11/01/2013  . DM 03/03/2010  . ABDOMINAL PAIN RIGHT LOWER QUADRANT 03/03/2010  . FULL INCONTINENCE OF FECES 03/03/2010  . DECREASED HEARING 05/02/2008  . Essential hypertension 05/02/2008  . ESOPHAGITIS 05/02/2008  . HIATAL HERNIA 05/02/2008  . RECTAL INCONTINENCE 05/02/2008  . SARCOIDOSIS 11/16/2007  . Severe persistent asthma 11/16/2007  . GERD 11/16/2007  . IRRITABLE BOWEL SYNDROME 11/16/2007   Past  Medical History:  Diagnosis Date  . Allergy   . Anxiety   . Arthritis   . Asthma   . Cataract    bilateral and removed  . Depression   . Diaphragmatic hernia without mention of obstruction or gangrene   . DM (diabetes mellitus) ([place])   . Dyspnea   . Esophageal reflux   . Esophagitis, unspecified   . H/O adenomatous polyp of colon 2011  . Heart murmur    no cardiologist, family Dr. Maryfrances Bunnell  . History of kidney stones   . Hyperlipidemia   . Incontinence of feces   . Irritable bowel syndrome   . Kidney stones   . OSA (obstructive sleep apnea) 09/28/2018  . Sarcoidosis   . Sleep apnea    no cpap now  . Status post dilation of esophageal narrowing   . Unspecified asthma(493.90)   . Unspecified essential hypertension   . Unspecified hearing loss     Family History  Problem Relation Age of Onset  . Esophageal cancer Father   . Diabetes Mother   . Diabetes Maternal Grandmother   . Colon cancer Neg Hx   . Rectal cancer Neg Hx   . Stomach cancer Neg Hx     Past Surgical History:  Procedure Laterality Date  . BLADDER SURGERY    . CATARACT EXTRACTION Bilateral   . HARDWARE REMOVAL Left 01/12/2017   Procedure: HARDWARE REMOVAL left wrist;  Surgeon: Leanora Cover, MD;  Location: [place];  Service: Orthopedics;  Laterality: Left;  . HEMORRHOID SURGERY    . NASAL SEPTUM SURGERY    . PARTIAL HYSTERECTOMY    . REVERSE SHOULDER ARTHROPLASTY Left 02/22/2018  . REVERSE SHOULDER ARTHROPLASTY Left 02/22/2018   Procedure: LEFT REVERSE SHOULDER ARTHROPLASTY;  Surgeon: Meredith Pel, MD;  Location: [place];  Service: Orthopedics;  Laterality: Left;  . TONSILLECTOMY AND ADENOIDECTOMY    . TRANSCAROTID ARTERY REVASCULARIZATION Left 01/17/2020   Procedure: TRANSCAROTID ARTERY REVASCULARIZATION;  Surgeon: Serafina Mitchell, MD;  Location[facility];  Service: Vascular;  Laterality: Left;  . TRIGGER FINGER RELEASE Left 05/12/2016   Procedure: RELEASE TRIGGER FINGER/A-1 PULLEY  INFECTION LEFT RING TENDON SHEATH INJECT RIGHT INDEX FINGER;  Surgeon: Leanora Cover, MD;  Location: [place];  Service: Orthopedics;  Laterality: Left;  . TUBAL LIGATION     Social History   Occupational History  . Occupation: retired  Tobacco Use  . Smoking status: Never Smoker  . Smokeless tobacco: Never Used  Substance and Sexual Activity  . Alcohol use: No  . Drug use: No  . Sexual activity: Not on file

## 2020-03-17 DIAGNOSIS — I10 Essential (primary) hypertension: Secondary | ICD-10-CM | POA: Diagnosis not present

## 2020-03-17 DIAGNOSIS — J4540 Moderate persistent asthma, uncomplicated: Secondary | ICD-10-CM | POA: Diagnosis not present

## 2020-03-17 DIAGNOSIS — J4550 Severe persistent asthma, uncomplicated: Secondary | ICD-10-CM

## 2020-03-17 DIAGNOSIS — J189 Pneumonia, unspecified organism: Secondary | ICD-10-CM | POA: Diagnosis not present

## 2020-03-17 DIAGNOSIS — D692 Other nonthrombocytopenic purpura: Secondary | ICD-10-CM | POA: Diagnosis not present

## 2020-03-18 ENCOUNTER — Ambulatory Visit (INDEPENDENT_AMBULATORY_CARE_PROVIDER_SITE_OTHER): Payer: Medicare Other | Admitting: *Deleted

## 2020-03-18 ENCOUNTER — Other Ambulatory Visit: Payer: Self-pay

## 2020-03-18 ENCOUNTER — Telehealth: Payer: Self-pay

## 2020-03-18 DIAGNOSIS — J4550 Severe persistent asthma, uncomplicated: Secondary | ICD-10-CM | POA: Diagnosis not present

## 2020-03-18 NOTE — Telephone Encounter (Signed)
Called and schedule pt for appointment.

## 2020-03-18 NOTE — Telephone Encounter (Signed)
Approved for Gel One-Bilateral knee Dr. Latanya Presser and Bill No copay No OOP after deductible is met 2nd Insurance to cover @ 100% after medicare No prior auth required

## 2020-03-23 ENCOUNTER — Other Ambulatory Visit: Payer: Self-pay

## 2020-03-23 ENCOUNTER — Ambulatory Visit (INDEPENDENT_AMBULATORY_CARE_PROVIDER_SITE_OTHER): Payer: Medicare Other | Admitting: Podiatry

## 2020-03-23 DIAGNOSIS — B351 Tinea unguium: Secondary | ICD-10-CM | POA: Diagnosis not present

## 2020-03-23 DIAGNOSIS — E1151 Type 2 diabetes mellitus with diabetic peripheral angiopathy without gangrene: Secondary | ICD-10-CM

## 2020-03-23 DIAGNOSIS — E1169 Type 2 diabetes mellitus with other specified complication: Secondary | ICD-10-CM | POA: Diagnosis not present

## 2020-03-23 NOTE — Progress Notes (Signed)
  Subjective:  Patient ID: [name], female    DOB: [birth year],  MRN: [id number]  No chief complaint on file.  74 y.o. female presents for routine foot care. Denies new issues. Unsure last AM bs, last A1c>7. Saw Dr. Cathi Roan last week.  Objective:  Physical Exam: warm, good capillary refill, nail exam onychomycosis of the toenails, no trophic changes or ulcerative lesions, normal DP and reduced PT pulses, and normal sensory exam. Left Foot: normal exam, no swelling, tenderness, instability; ligaments intact, full range of motion of all ankle/foot joints  Right Foot: normal exam, no swelling, tenderness, instability; ligaments intact, full range of motion of all ankle/foot joints   No images are attached to the encounter.  Assessment:   1. Onychomycosis of multiple toenails with type 2 diabetes mellitus and peripheral angiopathy ([place])    Plan:  Patient was evaluated and treated and all questions answered.  Onychomycosis, Diabetes and PAD -Patient is diabetic with a qualifying condition for at risk foot care.   Procedure: Nail Debridement Rationale: Patient meets criteria for routine foot care due to PAD Type of Debridement: manual, sharp debridement. Instrumentation: Nail nipper, rotary burr. Number of Nails: 10  Return in about 3 months (around 06/22/2020) for Diabetic Foot Care.

## 2020-03-25 ENCOUNTER — Other Ambulatory Visit: Payer: Self-pay

## 2020-03-25 ENCOUNTER — Encounter: Payer: Self-pay | Admitting: Orthopedic Surgery

## 2020-03-25 ENCOUNTER — Ambulatory Visit (INDEPENDENT_AMBULATORY_CARE_PROVIDER_SITE_OTHER): Payer: Medicare Other | Admitting: Orthopedic Surgery

## 2020-03-25 DIAGNOSIS — M1711 Unilateral primary osteoarthritis, right knee: Secondary | ICD-10-CM

## 2020-03-25 DIAGNOSIS — M1712 Unilateral primary osteoarthritis, left knee: Secondary | ICD-10-CM | POA: Diagnosis not present

## 2020-03-25 DIAGNOSIS — M25561 Pain in right knee: Secondary | ICD-10-CM

## 2020-03-25 DIAGNOSIS — M25562 Pain in left knee: Secondary | ICD-10-CM

## 2020-03-25 MED ORDER — CROSS-LINK HYAL ACID (VISC) 30 MG/3ML IX PRSY
30.0000 mg | PREFILLED_SYRINGE | INTRA_ARTICULAR | Status: AC | PRN
  Administered 2020-03-25: 30 mg via INTRA_ARTICULAR

## 2020-03-25 MED ORDER — TRAMADOL HCL 50 MG PO TABS: 50.0000 mg | ORAL_TABLET | Freq: Every evening | ORAL | 0 refills | Status: DC | PRN

## 2020-03-25 MED ORDER — LIDOCAINE HCL 1 % IJ SOLN
5.0000 mL | INTRAMUSCULAR | Status: AC | PRN
  Administered 2020-03-25: 5 mL

## 2020-03-25 NOTE — Progress Notes (Signed)
   Procedure Note  Patient: [name]             Date of Birth: March 12, 1946           MRN: [id number]             Visit Date: 03/25/2020  Procedures: Visit Diagnoses:  1. Pain in both knees, unspecified chronicity     Large Joint Inj: bilateral knee on 03/25/2020 7:49 PM Indications: diagnostic evaluation, joint swelling and pain Details: 18 G 1.5 in needle, superolateral approach  Arthrogram: No  Medications (Right): 5 mL lidocaine 1 %; 30 mg Cross-Linked Hyaluronate 30 MG/3ML Medications (Left): 5 mL lidocaine 1 %; 30 mg Cross-Linked Hyaluronate 30 MG/3ML Outcome: tolerated well, no immediate complications Procedure, treatment alternatives, risks and benefits explained, specific risks discussed. Consent was given by the patient. Immediately prior to procedure a time out was called to verify the correct patient, procedure, equipment, support staff and site/side marked as required. Patient was prepped and draped in the usual sterile fashion.    Patient underwent bilateral knee injections today for known arthritis.  Last injections 2019.  Also MRI lumbar spine for neurogenic claudication reordered.  This was ordered at her last clinic visit on 03/12/2020.  Tramadol also prescribed 1 a day.  Follow-up after MRI scan

## 2020-03-27 DIAGNOSIS — J4540 Moderate persistent asthma, uncomplicated: Secondary | ICD-10-CM | POA: Diagnosis not present

## 2020-03-27 DIAGNOSIS — I10 Essential (primary) hypertension: Secondary | ICD-10-CM | POA: Diagnosis not present

## 2020-03-27 DIAGNOSIS — E119 Type 2 diabetes mellitus without complications: Secondary | ICD-10-CM | POA: Diagnosis not present

## 2020-04-06 DIAGNOSIS — I10 Essential (primary) hypertension: Secondary | ICD-10-CM | POA: Diagnosis not present

## 2020-04-06 DIAGNOSIS — J4540 Moderate persistent asthma, uncomplicated: Secondary | ICD-10-CM | POA: Diagnosis not present

## 2020-04-06 DIAGNOSIS — E1122 Type 2 diabetes mellitus with diabetic chronic kidney disease: Secondary | ICD-10-CM | POA: Diagnosis not present

## 2020-04-06 DIAGNOSIS — J302 Other seasonal allergic rhinitis: Secondary | ICD-10-CM | POA: Diagnosis not present

## 2020-04-06 DIAGNOSIS — D692 Other nonthrombocytopenic purpura: Secondary | ICD-10-CM | POA: Diagnosis not present

## 2020-04-09 ENCOUNTER — Ambulatory Visit
Admission: RE | Admit: 2020-04-09 | Discharge: 2020-04-09 | Disposition: A | Discharge status: Home / Self Care | Payer: Medicare Other | Source: Ambulatory Visit | Attending: Surgical | Admitting: Surgical

## 2020-04-09 ENCOUNTER — Other Ambulatory Visit: Payer: Self-pay

## 2020-04-09 DIAGNOSIS — M48061 Spinal stenosis, lumbar region without neurogenic claudication: Secondary | ICD-10-CM | POA: Diagnosis not present

## 2020-04-09 DIAGNOSIS — M545 Low back pain, unspecified: Secondary | ICD-10-CM

## 2020-04-13 DIAGNOSIS — H43813 Vitreous degeneration, bilateral: Secondary | ICD-10-CM | POA: Diagnosis not present

## 2020-04-13 DIAGNOSIS — H52203 Unspecified astigmatism, bilateral: Secondary | ICD-10-CM | POA: Diagnosis not present

## 2020-04-13 DIAGNOSIS — E119 Type 2 diabetes mellitus without complications: Secondary | ICD-10-CM | POA: Diagnosis not present

## 2020-04-13 DIAGNOSIS — H353132 Nonexudative age-related macular degeneration, bilateral, intermediate dry stage: Secondary | ICD-10-CM | POA: Diagnosis not present

## 2020-04-14 DIAGNOSIS — J4550 Severe persistent asthma, uncomplicated: Secondary | ICD-10-CM

## 2020-04-15 ENCOUNTER — Ambulatory Visit (INDEPENDENT_AMBULATORY_CARE_PROVIDER_SITE_OTHER): Payer: Medicare Other | Admitting: *Deleted

## 2020-04-15 ENCOUNTER — Encounter: Payer: Self-pay | Admitting: Orthopedic Surgery

## 2020-04-15 ENCOUNTER — Ambulatory Visit (INDEPENDENT_AMBULATORY_CARE_PROVIDER_SITE_OTHER): Payer: Medicare Other | Admitting: Orthopedic Surgery

## 2020-04-15 ENCOUNTER — Other Ambulatory Visit: Payer: Self-pay

## 2020-04-15 DIAGNOSIS — M545 Low back pain, unspecified: Secondary | ICD-10-CM

## 2020-04-15 DIAGNOSIS — I6523 Occlusion and stenosis of bilateral carotid arteries: Secondary | ICD-10-CM | POA: Diagnosis not present

## 2020-04-15 DIAGNOSIS — J4550 Severe persistent asthma, uncomplicated: Secondary | ICD-10-CM | POA: Diagnosis not present

## 2020-04-15 NOTE — Progress Notes (Signed)
Office Visit Note   Patient: [name]           Date of Birth: February 26, 1946           MRN: [id number] Visit Date: 04/15/2020 Requested by: [name] Grammes, MD [address] [place],  [place] [address] PCP: [name] Grammes, MD  Subjective: Chief Complaint  Patient presents with  . Lower Back - Pain    HPI: [name] is a 74 year old patient with low back pain.  She had carotid surgery in February and will be off Plavix in about 3 months.  MRI scan shows new small L3-4 bulge but no definite operative problem and fairly minimal degenerative changes except for some facet arthritis in the lower lumbar spine.  Definitely nothing operative in the MRI scan of her back.  She continues to have low back pain with occasional radiating leg pain.  She also reports one episode of entire arm pain but she wants to hold off on working that up until she is off her Plavix.              ROS: All systems reviewed are negative as they relate to the chief complaint within the history of present illness.  Patient denies  fevers or chills.   Assessment & Plan: Visit Diagnoses:  1. Low back pain, unspecified back pain laterality, unspecified chronicity, unspecified whether sciatica present     Plan: Impression is low back pain which I think could be fairly amenable to 1 or 2 epidural steroid injections.  She wants to come off the Plavix first which makes sense.  She will call when she is off Plavix and wants to get scheduled for ESI with Dr. Ernestina Patches.  Follow-Up Instructions: Return if symptoms worsen or fail to improve.   Orders:  No orders of the defined types were placed in this encounter.  No orders of the defined types were placed in this encounter.     Procedures: No procedures performed   Clinical Data: No additional findings.  Objective: Vital Signs: There were no vitals taken for this visit.  Physical Exam:   Constitutional: Patient appears well-developed HEENT:  Head:  Normocephalic Eyes:EOM are normal Neck: Normal range of motion Cardiovascular: Normal rate Pulmonary/chest: Effort normal Neurologic: Patient is alert Skin: Skin is warm Psychiatric: Patient has normal mood and affect    Ortho Exam: Ortho exam demonstrates full active and passive range of motion of hips knees and ankles.  No nerve root tension signs today.  Patient has good ankle dorsiflexion plantarflexion quad hamstring strength.  Pedal pulses palpable.  No definite paresthesias in the legs.  She is able to get up and down from a chair relatively easily.  No muscle atrophy in the legs.  Specialty Comments:  No specialty comments available.  Imaging: No results found.   PMFS History: Patient Active Problem List   Diagnosis Date Noted  . Asthma 01/16/2020  . History of transient ischemic attack (TIA) 01/16/2020  . Bilateral carotid artery stenosis 01/16/2020  . Normocytic anemia 01/16/2020  . Diabetes mellitus type II, non insulin dependent ([place]) 07/05/2019  . Mild aortic stenosis 07/05/2019  . Mitral regurgitation 07/05/2019  . Pedal edema 05/06/2019  . DOE (dyspnea on exertion) 05/06/2019  . Hearing loss, mixed, bilateral 12/17/2018  . Bilateral primary osteoarthritis of knee 10/16/2018  . OSA (obstructive sleep apnea) 09/28/2018  . Shoulder arthritis 02/22/2018  . Primary osteoarthritis, left shoulder 12/11/2017  . DDD (degenerative disc disease), lumbar 01/23/2017  . History  of diabetes mellitus 01/23/2017  . History of depression 01/23/2017  . History of asthma 01/23/2017  . Dyslipidemia 01/23/2017  . Age-related osteoporosis without current pathological fracture 01/23/2017  . History of gastroesophageal reflux (GERD) 01/17/2017  . Primary osteoarthritis of both knees 01/17/2017  . Primary osteoarthritis of both feet 01/17/2017  . Vitamin D deficiency 01/17/2017  . Rheumatoid factor positive 12/13/2016  . Primary osteoarthritis of both hands 12/13/2016  .  Retained orthopedic hardware 11/14/2016  . Pain in left wrist 06/06/2016  . Stiffness of left wrist joint 06/06/2016  . Closed fracture of distal end of radius 06/05/2016  . Acquired trigger finger 04/20/2016  . Obstructive lung disease ([place]) 07/29/2015  . H/O allergic rhinitis 07/29/2015  . LPRD (laryngopharyngeal reflux disease) 07/29/2015  . Chest pain 11/01/2013  . Murmur 11/01/2013  . DM 03/03/2010  . ABDOMINAL PAIN RIGHT LOWER QUADRANT 03/03/2010  . FULL INCONTINENCE OF FECES 03/03/2010  . DECREASED HEARING 05/02/2008  . Essential hypertension 05/02/2008  . ESOPHAGITIS 05/02/2008  . HIATAL HERNIA 05/02/2008  . RECTAL INCONTINENCE 05/02/2008  . SARCOIDOSIS 11/16/2007  . Severe persistent asthma 11/16/2007  . GERD 11/16/2007  . IRRITABLE BOWEL SYNDROME 11/16/2007   Past Medical History:  Diagnosis Date  . Allergy   . Anxiety   . Arthritis   . Asthma   . Cataract    bilateral and removed  . Depression   . Diaphragmatic hernia without mention of obstruction or gangrene   . DM (diabetes mellitus) ([place])   . Dyspnea   . Esophageal reflux   . Esophagitis, unspecified   . H/O adenomatous polyp of colon 2011  . Heart murmur    no cardiologist, family Dr. Maryfrances Bunnell  . History of kidney stones   . Hyperlipidemia   . Incontinence of feces   . Irritable bowel syndrome   . Kidney stones   . OSA (obstructive sleep apnea) 09/28/2018  . Sarcoidosis   . Sleep apnea    no cpap now  . Status post dilation of esophageal narrowing   . Unspecified asthma(493.90)   . Unspecified essential hypertension   . Unspecified hearing loss     Family History  Problem Relation Age of Onset  . Esophageal cancer Father   . Diabetes Mother   . Diabetes Maternal Grandmother   . Colon cancer Neg Hx   . Rectal cancer Neg Hx   . Stomach cancer Neg Hx     Past Surgical History:  Procedure Laterality Date  . BLADDER SURGERY    . CATARACT EXTRACTION Bilateral   . HARDWARE REMOVAL Left  01/12/2017   Procedure: HARDWARE REMOVAL left wrist;  Surgeon: Leanora Cover, MD;  Location: [facility];  Service: Orthopedics;  Laterality: Left;  . HEMORRHOID SURGERY    . NASAL SEPTUM SURGERY    . PARTIAL HYSTERECTOMY    . REVERSE SHOULDER ARTHROPLASTY Left 02/22/2018  . REVERSE SHOULDER ARTHROPLASTY Left 02/22/2018   Procedure: LEFT REVERSE SHOULDER ARTHROPLASTY;  Surgeon: Meredith Pel, MD;  Location: [facility];  Service: Orthopedics;  Laterality: Left;  . TONSILLECTOMY AND ADENOIDECTOMY    . TRANSCAROTID ARTERY REVASCULARIZATION Left 01/17/2020   Procedure: TRANSCAROTID ARTERY REVASCULARIZATION;  Surgeon: Serafina Mitchell, MD;  Location[facility] OR;  Service: Vascular;  Laterality: Left;  . TRIGGER FINGER RELEASE Left 05/12/2016   Procedure: RELEASE TRIGGER FINGER/A-1 PULLEY INFECTION LEFT RING TENDON SHEATH INJECT RIGHT INDEX FINGER;  Surgeon: Leanora Cover, MD;  Location: [facility];  Service: Orthopedics;  Laterality:  Left;  . TUBAL LIGATION     Social History   Occupational History  . Occupation: retired  Tobacco Use  . Smoking status: Never Smoker  . Smokeless tobacco: Never Used  Substance and Sexual Activity  . Alcohol use: No  . Drug use: No  . Sexual activity: Not on file

## 2020-04-16 ENCOUNTER — Other Ambulatory Visit: Payer: Self-pay

## 2020-04-16 DIAGNOSIS — M810 Age-related osteoporosis without current pathological fracture: Secondary | ICD-10-CM | POA: Diagnosis not present

## 2020-04-16 MED ORDER — CLOPIDOGREL BISULFATE 75 MG PO TABS: 75.0000 mg | ORAL_TABLET | Freq: Every day | ORAL | 1 refills | Status: DC

## 2020-04-27 DIAGNOSIS — E1122 Type 2 diabetes mellitus with diabetic chronic kidney disease: Secondary | ICD-10-CM | POA: Diagnosis not present

## 2020-04-27 DIAGNOSIS — I129 Hypertensive chronic kidney disease with stage 1 through stage 4 chronic kidney disease, or unspecified chronic kidney disease: Secondary | ICD-10-CM | POA: Diagnosis not present

## 2020-04-27 DIAGNOSIS — N189 Chronic kidney disease, unspecified: Secondary | ICD-10-CM | POA: Diagnosis not present

## 2020-04-27 DIAGNOSIS — J4530 Mild persistent asthma, uncomplicated: Secondary | ICD-10-CM | POA: Diagnosis not present

## 2020-05-06 ENCOUNTER — Other Ambulatory Visit: Payer: Self-pay

## 2020-05-06 ENCOUNTER — Ambulatory Visit (INDEPENDENT_AMBULATORY_CARE_PROVIDER_SITE_OTHER): Payer: Medicare Other | Admitting: Allergy and Immunology

## 2020-05-06 ENCOUNTER — Encounter: Payer: Self-pay | Admitting: Allergy and Immunology

## 2020-05-06 VITALS — BP 118/52 | HR 54 | Resp 18

## 2020-05-06 DIAGNOSIS — K219 Gastro-esophageal reflux disease without esophagitis: Secondary | ICD-10-CM

## 2020-05-06 DIAGNOSIS — J3089 Other allergic rhinitis: Secondary | ICD-10-CM

## 2020-05-06 DIAGNOSIS — Z862 Personal history of diseases of the blood and blood-forming organs and certain disorders involving the immune mechanism: Secondary | ICD-10-CM

## 2020-05-06 DIAGNOSIS — I6523 Occlusion and stenosis of bilateral carotid arteries: Secondary | ICD-10-CM | POA: Diagnosis not present

## 2020-05-06 DIAGNOSIS — J4550 Severe persistent asthma, uncomplicated: Secondary | ICD-10-CM | POA: Diagnosis not present

## 2020-05-06 MED ORDER — FAMOTIDINE 40 MG PO TABS: ORAL_TABLET | ORAL | 5 refills | Status: DC

## 2020-05-06 NOTE — Patient Instructions (Addendum)
  1. Continue Xolair and Epi-Pen  2. INCREASE Trelegy 200 - 1 inhalation 1 time per day  3. Continue Flonase - one spray each nostril 1 time per day.    4. Continue omeprazole 40 twice a day    5. START Famotidine 40 mg in evening  6. Continue nasal saline, ProAir HFA, Duoneb if needed.  7. Can add OTC Loratadine 10 mg one tablet 1-2 times per day if needed  8. Can add OTC Mucinex DM 1-2 tablet 1-2 times per day if needed  9. Can add ipratropium 0.06% nasal spray - 1-2 sprays each nostril every 6 hours if needed  10. Return to clinic in 12 weeks or earlier if problem

## 2020-05-06 NOTE — Progress Notes (Signed)
[facility]   Follow-up Note  Referring Provider: Serita Grammes, MD Primary Provider: Serita Grammes, MD Date of Office Visit: 05/06/2020  Subjective:   [name] (DOB: Jan 07, 1946) is a 74 y.o. female who returns to the [facility] on 05/06/2020 in re-evaluation of the following:  HPI: [name] returns to this clinic in evaluation of asthma and history of pulmonary sarcoidosis with fibrotic sequela and a history of allergic rhinitis and LPR.  Her last visit to this clinic was 03 February 2020.  She has done relatively well regarding her breathing. She still has these intermittent coughing spells and she still has "phlegm" that commonly gets stuck in her throat. Sometimes she will cough and have regurgitation. She apparently saw her primary care doctor sometime in the past several months and was given a treatment for mucus in her throat which helped her transiently.  She had very little issues with her nose. All the bleeding from her nose has resolved. She is back to using Flonase on a daily basis only 1 time per day.  She thinks her reflux is under pretty good control at this point.  She did receive 2 Moderna Covid vaccinations.  Allergies as of 05/06/2020      Reactions   Codeine Nausea And Vomiting   Sulfa Antibiotics Other (See Comments)   Allergic per pt's mother       Medication List      acetaminophen 325 MG tablet Commonly known as: TYLENOL Take 2 tablets (650 mg total) by mouth every 6 (six) hours as needed for mild pain (or Fever >/= 101).   albuterol 108 (90 Base) MCG/ACT inhaler Commonly known as: VENTOLIN HFA INHALE 2 PUFFS BY MOUTH 4 TIMES A DAY AS NEEDED   amLODipine 5 MG tablet Commonly known as: NORVASC Take 5 mg by mouth daily.   aspirin EC 81 MG tablet Take 81 mg by mouth daily.   atorvastatin 20 MG tablet Commonly known as: LIPITOR Take 2 tablets (40 mg total) by mouth every  evening.   citalopram 20 MG tablet Commonly known as: CELEXA Take 20 mg by mouth daily.   clopidogrel 75 MG tablet Commonly known as: PLAVIX Take 1 tablet (75 mg total) by mouth daily.   dicyclomine 20 MG tablet Commonly known as: BENTYL Take 20 mg by mouth 2 (two) times daily.   EPINEPHrine 0.3 mg/0.3 mL Soaj injection Commonly known as: EPI-PEN USE AS DIRECTED FOR LIFE THREATENING ALLERGIC REACTION   fluticasone 50 MCG/ACT nasal spray Commonly known as: FLONASE USE ONE SPRAY IN EACH NOSTRIL ONCE OR TWICE DAILY   furosemide 20 MG tablet Commonly known as: LASIX Take 20 mg by mouth 2 (two) times daily.   HYDROcodone-acetaminophen 5-325 MG tablet Commonly known as: NORCO/VICODIN Take 1 tablet by mouth every 6 (six) hours as needed for moderate pain.   ipratropium-albuterol 0.5-2.5 (3) MG/3ML Soln Commonly known as: DUONEB Take 3 mLs by nebulization every 6 (six) hours as needed (shortness of breath/wheezing).   levocetirizine 5 MG tablet Commonly known as: XYZAL Take 5 mg by mouth at bedtime.   Lidocaine 4 % Ptch Apply 1 patch topically 2 (two) times daily.   losartan 100 MG tablet Commonly known as: COZAAR Take 100 mg by mouth at bedtime.   montelukast 10 MG tablet Commonly known as: SINGULAIR Take 10 mg by mouth daily.   omalizumab 150 MG injection Commonly known as: XOLAIR Inject 150 mg into  the skin every 28 (twenty-eight) days.   omeprazole 40 MG capsule Commonly known as: PRILOSEC TAKE 1 CAPSULE BY MOUTH TWICE A DAY   oxybutynin 15 MG 24 hr tablet Commonly known as: DITROPAN XL Take 15 mg by mouth daily.   pioglitazone 30 MG tablet Commonly known as: ACTOS Take 30 mg by mouth daily.   PROBIOTIC ACIDOPHILUS PO Take 2 capsules by mouth every morning.   PROBIOTIC DAILY PO Take 1 capsule by mouth daily.   telmisartan 80 MG tablet Commonly known as: MICARDIS Take 80 mg by mouth daily.   traMADol 50 MG tablet Commonly known as: ULTRAM Take 1  tablet (50 mg total) by mouth at bedtime as needed.   Trelegy Ellipta 100-62.5-25 MCG/INH Aepb Generic drug: Fluticasone-Umeclidin-Vilant Inhale 1 puff into the lungs every morning.   Victoza 18 MG/3ML Sopn Generic drug: liraglutide Inject 0.6 mg into the skin every morning.       Past Medical History:  Diagnosis Date  . Allergy   . Anxiety   . Arthritis   . Asthma   . Cataract    bilateral and removed  . Depression   . Diaphragmatic hernia without mention of obstruction or gangrene   . DM (diabetes mellitus) ([place] [place])   . Dyspnea   . Esophageal reflux   . Esophagitis, unspecified   . H/O adenomatous polyp of colon 2011  . Heart murmur    no cardiologist, family Dr. Maryfrances [name]  . History of kidney stones   . Hyperlipidemia   . Incontinence of feces   . Irritable bowel syndrome   . Kidney stones   . OSA (obstructive sleep apnea) 09/28/2018  . Sarcoidosis   . Sleep apnea    no cpap now  . Status post dilation of esophageal narrowing   . Unspecified asthma(493.90)   . Unspecified essential hypertension   . Unspecified hearing loss     Past Surgical History:  Procedure Laterality Date  . BLADDER SURGERY    . CATARACT EXTRACTION Bilateral   . HARDWARE REMOVAL Left 01/12/2017   Procedure: HARDWARE REMOVAL left wrist;  Surgeon: Leanora Cover, MD;  Location: [facility];  Service: Orthopedics;  Laterality: Left;  . HEMORRHOID SURGERY    . NASAL SEPTUM SURGERY    . PARTIAL HYSTERECTOMY    . REVERSE SHOULDER ARTHROPLASTY Left 02/22/2018  . REVERSE SHOULDER ARTHROPLASTY Left 02/22/2018   Procedure: LEFT REVERSE SHOULDER ARTHROPLASTY;  Surgeon: Meredith Pel, MD;  Location: [facility];  Service: Orthopedics;  Laterality: Left;  . TONSILLECTOMY AND ADENOIDECTOMY    . TRANSCAROTID ARTERY REVASCULARIZATION Left 01/17/2020   Procedure: TRANSCAROTID ARTERY REVASCULARIZATION;  Surgeon: Serafina Mitchell, MD;  Location: [facility] OR;  Service: Vascular;  Laterality: Left;  .  TRIGGER FINGER RELEASE Left 05/12/2016   Procedure: RELEASE TRIGGER FINGER/A-1 PULLEY INFECTION LEFT RING TENDON SHEATH INJECT RIGHT INDEX FINGER;  Surgeon: Leanora Cover, MD;  Location: [facility];  Service: Orthopedics;  Laterality: Left;  . TUBAL LIGATION      Review of systems negative except as noted in HPI / PMHx or noted below:  Review of Systems  Constitutional: Negative.   HENT: Negative.   Eyes: Negative.   Respiratory: Negative.   Cardiovascular: Negative.   Gastrointestinal: Negative.   Genitourinary: Negative.   Musculoskeletal: Negative.   Skin: Negative.   Neurological: Negative.   Endo/Heme/Allergies: Negative.   Psychiatric/Behavioral: Negative.      Objective:   Vitals:   05/06/20 1045  BP: (!) 118/52  Pulse: (!) 54  Resp: 18  SpO2: 98%          Physical Exam Constitutional:      Appearance: She is not diaphoretic.  HENT:     Head: Normocephalic.     Right Ear: Tympanic membrane, ear canal and external ear normal.     Left Ear: Tympanic membrane, ear canal and external ear normal.     Nose: Nose normal. No mucosal edema or rhinorrhea.     Mouth/Throat:     Pharynx: Uvula midline. No oropharyngeal exudate.  Eyes:     Conjunctiva/sclera: Conjunctivae normal.  Neck:     Thyroid: No thyromegaly.     Trachea: Trachea normal. No tracheal tenderness or tracheal deviation.  Cardiovascular:     Rate and Rhythm: Normal rate and regular rhythm.     Heart sounds: Normal heart sounds, S1 normal and S2 normal. No murmur.  Pulmonary:     Effort: No respiratory distress.     Breath sounds: Normal breath sounds. No stridor. No wheezing or rales.  Lymphadenopathy:     Head:     Right side of head: No tonsillar adenopathy.     Left side of head: No tonsillar adenopathy.     Cervical: No cervical adenopathy.  Skin:    Findings: No erythema or rash.     Nails: There is no clubbing.  Neurological:     Mental Status: She is alert.      Diagnostics:    Spirometry was performed and demonstrated an FEV1 of 1.10 at 61 % of predicted.  Assessment and Plan:   1. Asthma, severe persistent, well-controlled   2. History of sarcoidosis   3. Other allergic rhinitis   4. LPRD (laryngopharyngeal reflux disease)     1. Continue Xolair and Epi-Pen  2. INCREASE Trelegy 200 - 1 inhalation 1 time per day  3. Continue Flonase - one spray each nostril 1 time per day.    4. Continue omeprazole 40 twice a day    5. START Famotidine 40 mg in evening  6. Continue nasal saline, ProAir HFA, Duoneb if needed.  7. Can add OTC Loratadine 10 mg one tablet 1-2 times per day if needed  8. Can add OTC Mucinex DM 1-2 tablet 1-2 times per day if needed  9. Can add ipratropium 0.06% nasal spray - 1-2 sprays each nostril every 6 hours if needed  10. Return to clinic in 12 weeks or earlier if problem  [name] still has a little irritation in her airway and is difficult to tell if this is from active inflammation or a scarring process that occurred with her sarcoidosis or an issue with LPR. We will increase her inhaled steroid dose as noted above and have her start famotidine in addition to her proton pump inhibitor to address continued inflammation and continued LPR. We will see what happens over the course of the next 12 weeks and she can contact me should there be a significant problem during the interval.  Allena Katz, MD Allergy / [place]

## 2020-05-07 ENCOUNTER — Encounter: Payer: Self-pay | Admitting: Allergy and Immunology

## 2020-05-12 DIAGNOSIS — J4550 Severe persistent asthma, uncomplicated: Secondary | ICD-10-CM

## 2020-05-13 ENCOUNTER — Other Ambulatory Visit: Payer: Self-pay

## 2020-05-13 ENCOUNTER — Ambulatory Visit (INDEPENDENT_AMBULATORY_CARE_PROVIDER_SITE_OTHER): Payer: Medicare Other | Admitting: *Deleted

## 2020-05-13 DIAGNOSIS — J4550 Severe persistent asthma, uncomplicated: Secondary | ICD-10-CM

## 2020-05-18 DIAGNOSIS — E119 Type 2 diabetes mellitus without complications: Secondary | ICD-10-CM | POA: Diagnosis not present

## 2020-05-27 DIAGNOSIS — N189 Chronic kidney disease, unspecified: Secondary | ICD-10-CM | POA: Diagnosis not present

## 2020-05-27 DIAGNOSIS — I129 Hypertensive chronic kidney disease with stage 1 through stage 4 chronic kidney disease, or unspecified chronic kidney disease: Secondary | ICD-10-CM | POA: Diagnosis not present

## 2020-05-27 DIAGNOSIS — E1122 Type 2 diabetes mellitus with diabetic chronic kidney disease: Secondary | ICD-10-CM | POA: Diagnosis not present

## 2020-05-27 DIAGNOSIS — J4530 Mild persistent asthma, uncomplicated: Secondary | ICD-10-CM | POA: Diagnosis not present

## 2020-06-09 DIAGNOSIS — J4550 Severe persistent asthma, uncomplicated: Secondary | ICD-10-CM

## 2020-06-10 ENCOUNTER — Ambulatory Visit (INDEPENDENT_AMBULATORY_CARE_PROVIDER_SITE_OTHER): Payer: Medicare Other | Admitting: *Deleted

## 2020-06-10 ENCOUNTER — Other Ambulatory Visit: Payer: Self-pay

## 2020-06-10 DIAGNOSIS — J4550 Severe persistent asthma, uncomplicated: Secondary | ICD-10-CM | POA: Diagnosis not present

## 2020-06-22 ENCOUNTER — Other Ambulatory Visit: Payer: Self-pay

## 2020-06-22 ENCOUNTER — Ambulatory Visit (INDEPENDENT_AMBULATORY_CARE_PROVIDER_SITE_OTHER): Payer: Medicare Other | Admitting: Podiatry

## 2020-06-22 DIAGNOSIS — M79674 Pain in right toe(s): Secondary | ICD-10-CM | POA: Diagnosis not present

## 2020-06-22 DIAGNOSIS — E1151 Type 2 diabetes mellitus with diabetic peripheral angiopathy without gangrene: Secondary | ICD-10-CM | POA: Diagnosis not present

## 2020-06-22 DIAGNOSIS — E1169 Type 2 diabetes mellitus with other specified complication: Secondary | ICD-10-CM

## 2020-06-22 DIAGNOSIS — B351 Tinea unguium: Secondary | ICD-10-CM

## 2020-06-22 DIAGNOSIS — M79675 Pain in left toe(s): Secondary | ICD-10-CM | POA: Diagnosis not present

## 2020-06-22 NOTE — Progress Notes (Signed)
  Subjective:  Patient ID: [name], female    DOB: 1946/03/29,  MRN: [id number]  Chief Complaint  Patient presents with  . debride    Diabetic foot care  . Diabetes    FBS: 124 A1C: 5.7 PCP: [facility] x June -pt denies any pedal complaints   74 y.o. female presents for routine foot care. Denies new issues.   Objective:  Physical Exam: warm, good capillary refill, nail exam onychomycosis of the toenails, no trophic changes or ulcerative lesions, normal DP and reduced PT pulses, and normal sensory exam. Left Foot: normal exam, no swelling, tenderness, instability; ligaments intact, full range of motion of all ankle/foot joints  Right Foot: normal exam, no swelling, tenderness, instability; ligaments intact, full range of motion of all ankle/foot joints   No images are attached to the encounter.  Assessment:   1. Onychomycosis of multiple toenails with type 2 diabetes mellitus and peripheral angiopathy ([facility])   2. Pain due to onychomycosis of toenails of both feet    Plan:  Patient was evaluated and treated and all questions answered.  Onychomycosis, Diabetes and PAD -Patient is diabetic with a qualifying condition for at risk foot care.   Procedure: Nail Debridement Rationale: Patient meets criteria for routine foot care due to PAD Type of Debridement: manual, sharp debridement. Instrumentation: Nail nipper, rotary burr. Number of Nails: 10   No follow-ups on file.

## 2020-06-27 DIAGNOSIS — E1122 Type 2 diabetes mellitus with diabetic chronic kidney disease: Secondary | ICD-10-CM | POA: Diagnosis not present

## 2020-06-27 DIAGNOSIS — J4530 Mild persistent asthma, uncomplicated: Secondary | ICD-10-CM | POA: Diagnosis not present

## 2020-06-27 DIAGNOSIS — I129 Hypertensive chronic kidney disease with stage 1 through stage 4 chronic kidney disease, or unspecified chronic kidney disease: Secondary | ICD-10-CM | POA: Diagnosis not present

## 2020-06-27 DIAGNOSIS — N189 Chronic kidney disease, unspecified: Secondary | ICD-10-CM | POA: Diagnosis not present

## 2020-07-07 DIAGNOSIS — J4550 Severe persistent asthma, uncomplicated: Secondary | ICD-10-CM | POA: Diagnosis not present

## 2020-07-08 ENCOUNTER — Other Ambulatory Visit: Payer: Self-pay

## 2020-07-08 ENCOUNTER — Ambulatory Visit (INDEPENDENT_AMBULATORY_CARE_PROVIDER_SITE_OTHER): Payer: Medicare Other | Admitting: *Deleted

## 2020-07-08 DIAGNOSIS — J4550 Severe persistent asthma, uncomplicated: Secondary | ICD-10-CM | POA: Diagnosis not present

## 2020-07-17 DIAGNOSIS — J4540 Moderate persistent asthma, uncomplicated: Secondary | ICD-10-CM | POA: Diagnosis not present

## 2020-07-17 DIAGNOSIS — E1165 Type 2 diabetes mellitus with hyperglycemia: Secondary | ICD-10-CM | POA: Diagnosis not present

## 2020-07-17 DIAGNOSIS — J329 Chronic sinusitis, unspecified: Secondary | ICD-10-CM | POA: Diagnosis not present

## 2020-07-17 DIAGNOSIS — D692 Other nonthrombocytopenic purpura: Secondary | ICD-10-CM | POA: Diagnosis not present

## 2020-07-17 DIAGNOSIS — Z79899 Other long term (current) drug therapy: Secondary | ICD-10-CM | POA: Diagnosis not present

## 2020-07-17 DIAGNOSIS — Z6829 Body mass index (BMI) 29.0-29.9, adult: Secondary | ICD-10-CM | POA: Diagnosis not present

## 2020-07-17 DIAGNOSIS — E538 Deficiency of other specified B group vitamins: Secondary | ICD-10-CM | POA: Diagnosis not present

## 2020-07-21 ENCOUNTER — Ambulatory Visit (INDEPENDENT_AMBULATORY_CARE_PROVIDER_SITE_OTHER): Payer: Medicare Other | Admitting: Cardiology

## 2020-07-21 ENCOUNTER — Other Ambulatory Visit: Payer: Self-pay

## 2020-07-21 ENCOUNTER — Encounter: Payer: Self-pay | Admitting: Cardiology

## 2020-07-21 VITALS — BP 138/62 | HR 63 | Ht 61.0 in | Wt 160.2 lb

## 2020-07-21 DIAGNOSIS — I051 Rheumatic mitral insufficiency: Secondary | ICD-10-CM | POA: Diagnosis not present

## 2020-07-21 DIAGNOSIS — I350 Nonrheumatic aortic (valve) stenosis: Secondary | ICD-10-CM | POA: Diagnosis not present

## 2020-07-21 DIAGNOSIS — I10 Essential (primary) hypertension: Secondary | ICD-10-CM

## 2020-07-21 DIAGNOSIS — E119 Type 2 diabetes mellitus without complications: Secondary | ICD-10-CM

## 2020-07-21 NOTE — Patient Instructions (Signed)
Medication Instructions:  No medication changes. *If you need a refill on your cardiac medications before your next appointment, please call your pharmacy*   Lab Work: Your physician recommends that you have labs done in the office today. Your test included  basic metabolic panel, TSH, liver function and lipids.  If you have labs (blood work) drawn today and your tests are completely normal, you will receive your results only by: MyChart Message (if you have MyChart) OR A paper copy in the mail If you have any lab test that is abnormal or we need to change your treatment, we will call you to review the results.   Testing/Procedures: None ordered   Follow-Up: At [facility], you and your health needs are our priority.  As part of our continuing mission to provide you with exceptional heart care, we have created designated Provider Care Teams.  These Care Teams include your primary Cardiologist (physician) and Advanced Practice Providers (APPs -  Physician Assistants and Nurse Practitioners) who all work together to provide you with the care you need, when you need it.  We recommend signing up for the patient portal called "MyChart".  Sign up information is provided on this After Visit Summary.  MyChart is used to connect with patients for Virtual Visits (Telemedicine).  Patients are able to view lab/test results, encounter notes, upcoming appointments, etc.  Non-urgent messages can be sent to your provider as well.   To learn more about what you can do with MyChart, go to [email or web address].    Your next appointment:   6 month(s)  The format for your next appointment:   In Person  Provider:   Rajan Revankar, MD   Other Instructions NA  

## 2020-07-21 NOTE — Progress Notes (Signed)
Cardiology Office Note:    Date:  07/21/2020   ID:  REGENIA ERCK, DOB [birth year], MRN [id number]  PCP:  Serita Grammes, MD  Cardiologist:  Jenean Lindau, MD   Referring MD: Serita Grammes, MD    ASSESSMENT:    1. Essential hypertension   2. Mild aortic stenosis   3. Rheumatic mitral regurgitation   4. Diabetes mellitus type II, non insulin dependent (HCC)    PLAN:    In order of problems listed above:  1. Primary prevention stressed with the patient.  Importance of compliance with diet medication stressed and placed understanding. 2. Atherosclerotic vascular disease: She has a carotid stent and this is followed by her vascular surgeon.  I reviewed carotid Doppler reports.  She is on statin therapy and on dual antiplatelet agent because of stent to her carotid. 3. Mixed dyslipidemia: Diet was emphasized she will have blood work today including Chem-7 and liver lipid check. 4. Valvular heart disease, mild aortic stenosis and mild to moderate mitral regurgitation.  I discussed this with her at length.  She is stable clinically and we will continue to monitor. 5. Overweight: Exercise stressed.  Protocol explained and she promises to do better. 6. Patient will be seen in follow-up appointment in 6 months or earlier if the patient has any concerns    Medication Adjustments/Labs and Tests Ordered: Current medicines are reviewed at length with the patient today.  Concerns regarding medicines are outlined above.  No orders of the defined types were placed in this encounter.  No orders of the defined types were placed in this encounter.    No chief complaint on file.    History of Present Illness:    [name] is a 74 y.o. female.  Patient has past medical history of essential hypertension, dyslipidemia and diabetes mellitus.  She denies any problems at this time and takes care of activities of daily living.  No chest pain orthopnea or PND.  She has mild aortic  stenosis and mild to moderate mitral regurgitation.  She is walking on a regular basis.  At the time of my evaluation, the patient is alert awake oriented and in no distress.  Past Medical History:  Diagnosis Date  . Allergy   . Anxiety   . Arthritis   . Asthma   . Cataract    bilateral and removed  . Depression   . Diaphragmatic hernia without mention of obstruction or gangrene   . DM (diabetes mellitus) ([place])   . Dyspnea   . Esophageal reflux   . Esophagitis, unspecified   . H/O adenomatous polyp of colon 2011  . Heart murmur    no cardiologist, family Dr. Maryfrances Bunnell  . History of kidney stones   . Hyperlipidemia   . Incontinence of feces   . Irritable bowel syndrome   . Kidney stones   . OSA (obstructive sleep apnea) 09/28/2018  . Sarcoidosis   . Sleep apnea    no cpap now  . Status post dilation of esophageal narrowing   . Unspecified asthma(493.90)   . Unspecified essential hypertension   . Unspecified hearing loss     Past Surgical History:  Procedure Laterality Date  . BLADDER SURGERY    . CATARACT EXTRACTION Bilateral   . HARDWARE REMOVAL Left 01/12/2017   Procedure: HARDWARE REMOVAL left wrist;  Surgeon: Leanora Cover, MD;  Location: [facility];  Service: Orthopedics;  Laterality: Left;  . HEMORRHOID SURGERY    . NASAL  SEPTUM SURGERY    . PARTIAL HYSTERECTOMY    . REVERSE SHOULDER ARTHROPLASTY Left 02/22/2018  . REVERSE SHOULDER ARTHROPLASTY Left 02/22/2018   Procedure: LEFT REVERSE SHOULDER ARTHROPLASTY;  Surgeon: Meredith Pel, MD;  Location: [facility];  Service: Orthopedics;  Laterality: Left;  . TONSILLECTOMY AND ADENOIDECTOMY    . TRANSCAROTID ARTERY REVASCULARIZATION Left 01/17/2020   Procedure: TRANSCAROTID ARTERY REVASCULARIZATION;  Surgeon: Serafina Mitchell, MD;  [facility];  Service: Vascular;  Laterality: Left;  . TRIGGER FINGER RELEASE Left 05/12/2016   Procedure: RELEASE TRIGGER FINGER/A-1 PULLEY INFECTION LEFT RING TENDON  SHEATH INJECT RIGHT INDEX FINGER;  Surgeon: Leanora Cover, MD;  Location: [facility];  Service: Orthopedics;  Laterality: Left;  . TUBAL LIGATION      Current Medications: Current Meds  Medication Sig  . acetaminophen (TYLENOL) 325 MG tablet Take 2 tablets (650 mg total) by mouth every 6 (six) hours as needed for mild pain (or Fever >/= 101).  Marland Kitchen albuterol (PROVENTIL HFA;VENTOLIN HFA) 108 (90 Base) MCG/ACT inhaler INHALE 2 PUFFS BY MOUTH 4 TIMES A DAY AS NEEDED  . amLODipine (NORVASC) 5 MG tablet Take 5 mg by mouth daily.  Marland Kitchen aspirin EC 81 MG tablet Take 81 mg by mouth daily.  Marland Kitchen atorvastatin (LIPITOR) 20 MG tablet Take 2 tablets (40 mg total) by mouth every evening.  . citalopram (CELEXA) 20 MG tablet Take 20 mg by mouth daily.    . clopidogrel (PLAVIX) 75 MG tablet Take 1 tablet (75 mg total) by mouth daily.  Marland Kitchen dicyclomine (BENTYL) 10 MG capsule Take 10 mg by mouth 4 (four) times daily.  Marland Kitchen EPINEPHrine 0.3 mg/0.3 mL IJ SOAJ injection USE AS DIRECTED FOR LIFE THREATENING ALLERGIC REACTION  . famotidine (PEPCID) 40 MG tablet Take 1 tablet by mouth once daily in the evening  . fluticasone (FLONASE) 50 MCG/ACT nasal spray USE ONE SPRAY IN EACH NOSTRIL ONCE OR TWICE DAILY  . Fluticasone-Umeclidin-Vilant (TRELEGY ELLIPTA) 100-62.5-25 MCG/INH AEPB Inhale 1 puff into the lungs every morning.  . furosemide (LASIX) 20 MG tablet Take 20 mg by mouth 2 (two) times daily.   Marland Kitchen HYDROcodone-acetaminophen (NORCO/VICODIN) 5-325 MG tablet Take 1 tablet by mouth every 6 (six) hours as needed for moderate pain.  Marland Kitchen ipratropium-albuterol (DUONEB) 0.5-2.5 (3) MG/3ML SOLN Take 3 mLs by nebulization every 6 (six) hours as needed (shortness of breath/wheezing).  . Lactobacillus (PROBIOTIC ACIDOPHILUS PO) Take 2 capsules by mouth every morning.  Marland Kitchen levocetirizine (XYZAL) 5 MG tablet Take 5 mg by mouth at bedtime.   . Lidocaine 4 % PTCH Apply 1 patch topically 2 (two) times daily.  Marland Kitchen losartan (COZAAR) 100 MG  tablet Take 100 mg by mouth at bedtime.  . montelukast (SINGULAIR) 10 MG tablet Take 10 mg by mouth daily.  . OMALIZUMAB  Inject 0.25 mg into the skin once a week.  Marland Kitchen omeprazole (PRILOSEC) 40 MG capsule TAKE 1 CAPSULE BY MOUTH TWICE A DAY  . oxybutynin (DITROPAN XL) 15 MG 24 hr tablet Take 15 mg by mouth daily.   . pioglitazone (ACTOS) 30 MG tablet Take 30 mg by mouth daily.  . Probiotic Product (PROBIOTIC DAILY PO) Take 1 capsule by mouth daily.   Marland Kitchen tolterodine (DETROL LA) 4 MG 24 hr capsule Take 4 mg by mouth at bedtime.  . traMADol (ULTRAM) 50 MG tablet Take 1 tablet (50 mg total) by mouth at bedtime as needed.   Current Facility-Administered Medications for the 07/21/20 encounter (Office Visit) with Merril Isakson, Sunny Schlein  R, MD  Medication  . omalizumab Arvid Right) injection 150 mg     Allergies:   Codeine and Sulfa antibiotics   Social History   Socioeconomic History  . Marital status: Married    Spouse name: Not on file  . Number of children: 2  . Years of education: Not on file  . Highest education level: Not on file  Occupational History  . Occupation: retired  Tobacco Use  . Smoking status: Never Smoker  . Smokeless tobacco: Never Used  Vaping Use  . Vaping Use: Never used  Substance and Sexual Activity  . Alcohol use: No  . Drug use: No  . Sexual activity: Not on file  Other Topics Concern  . Not on file  Social History Narrative   Daily caffeine use.    Social Determinants of Health   Financial Resource Strain:   . Difficulty of Paying Living Expenses: Not on file  Food Insecurity:   . Worried About Charity fundraiser in the Last Year: Not on file  . Ran Out of Food in the Last Year: Not on file  Transportation Needs:   . Lack of Transportation (Medical): Not on file  . Lack of Transportation (Non-Medical): Not on file  Physical Activity:   . Days of Exercise per Week: Not on file  . Minutes of Exercise per Session: Not on file  Stress:   . Feeling of Stress  : Not on file  Social Connections:   . Frequency of Communication with Friends and Family: Not on file  . Frequency of Social Gatherings with Friends and Family: Not on file  . Attends Religious Services: Not on file  . Active Member of Clubs or Organizations: Not on file  . Attends Archivist Meetings: Not on file  . Marital Status: Not on file     Family History: The patient's family history includes Diabetes in her maternal grandmother and mother; Esophageal cancer in her father. There is no history of Colon cancer, Rectal cancer, or Stomach cancer.  ROS:   Please see the history of present illness.    All other systems reviewed and are negative.  EKGs/Labs/Other Studies Reviewed:    The following studies were reviewed today: Summary:  Right Carotid: Velocities in the right ICA are consistent with a 40-59%         stenosis.   Left Carotid: Patent stent with velocities in the left ICA consistent with  a        1-39% stenosis.   Vertebrals: Bilateral vertebral arteries demonstrate antegrade flow.  Subclavians: Normal flow hemodynamics were seen in bilateral subclavian        arteries.   *See table(s) above for measurements and observations.      Electronically signed by Harold Barban MD on 02/17/2020 at 2:00:43 PM.    Recent Labs: 01/16/2020: ALT 11 03/03/2020: BUN 23; Creatinine, Ser 1.02; Hemoglobin 10.7; Platelets 289; Potassium 4.2; Sodium 138  Recent Lipid Panel No results found for: CHOL, TRIG, HDL, CHOLHDL, VLDL, LDLCALC, LDLDIRECT  Physical Exam:    VS:  BP 138/62   Pulse 63   Ht 5\' 1"  (1.549 m)   Wt 160 lb 3.2 oz (72.7 kg)   SpO2 98%   BMI 30.27 kg/m     Wt Readings from Last 3 Encounters:  07/21/20 160 lb 3.2 oz (72.7 kg)  03/03/20 170 lb (77.1 kg)  02/17/20 169 lb 12.8 oz (77 kg)     GEN: Patient is in  no acute distress HEENT: Normal NECK: No JVD; No carotid bruits LYMPHATICS: No lymphadenopathy CARDIAC: Hear  sounds regular, 2/6 systolic murmur at the apex. RESPIRATORY:  Clear to auscultation without rales, wheezing or rhonchi  ABDOMEN: Soft, non-tender, non-distended MUSCULOSKELETAL:  No edema; No deformity  SKIN: Warm and dry NEUROLOGIC:  Alert and oriented x 3 PSYCHIATRIC:  Normal affect   Signed, Jenean Lindau, MD  07/21/2020 8:44 AM    [place]

## 2020-07-22 LAB — BASIC METABOLIC PANEL
BUN/Creatinine Ratio: 27 (ref 12–28)
BUN: 22 mg/dL (ref 8–27)
CO2: 22 mmol/L (ref 20–29)
Calcium: 8.8 mg/dL (ref 8.7–10.3)
Chloride: 105 mmol/L (ref 96–106)
Creatinine, Ser: 0.81 mg/dL (ref 0.57–1.00)
GFR calc Af Amer: 83 mL/min/{1.73_m2} (ref >59)
GFR calc non Af Amer: 72 mL/min/{1.73_m2} (ref >59)
Glucose: 111 mg/dL — ABNORMAL HIGH (ref 65–99)
Potassium: 4.5 mmol/L (ref 3.5–5.2)
Sodium: 141 mmol/L (ref 134–144)

## 2020-07-22 LAB — HEPATIC FUNCTION PANEL
ALT: 10 IU/L (ref 0–32)
AST: 25 IU/L (ref 0–40)
Albumin: 4.2 g/dL (ref 3.7–4.7)
Alkaline Phosphatase: 74 IU/L (ref 48–121)
Bilirubin Total: 0.4 mg/dL (ref 0.0–1.2)
Bilirubin, Direct: 0.15 mg/dL (ref 0.00–0.40)
Total Protein: 6.3 g/dL (ref 6.0–8.5)

## 2020-07-22 LAB — LIPID PANEL
Chol/HDL Ratio: 2.4 ratio (ref 0.0–4.4)
Cholesterol, Total: 139 mg/dL (ref 100–199)
HDL: 59 mg/dL (ref >39)
LDL Chol Calc (NIH): 63 mg/dL (ref 0–99)
Triglycerides: 92 mg/dL (ref 0–149)
VLDL Cholesterol Cal: 17 mg/dL (ref 5–40)

## 2020-07-22 LAB — TSH: TSH: 1.36 u[IU]/mL (ref 0.450–4.500)

## 2020-07-28 DIAGNOSIS — J4530 Mild persistent asthma, uncomplicated: Secondary | ICD-10-CM | POA: Diagnosis not present

## 2020-07-28 DIAGNOSIS — N189 Chronic kidney disease, unspecified: Secondary | ICD-10-CM | POA: Diagnosis not present

## 2020-07-28 DIAGNOSIS — E1122 Type 2 diabetes mellitus with diabetic chronic kidney disease: Secondary | ICD-10-CM | POA: Diagnosis not present

## 2020-07-28 DIAGNOSIS — I129 Hypertensive chronic kidney disease with stage 1 through stage 4 chronic kidney disease, or unspecified chronic kidney disease: Secondary | ICD-10-CM | POA: Diagnosis not present

## 2020-07-29 ENCOUNTER — Ambulatory Visit (INDEPENDENT_AMBULATORY_CARE_PROVIDER_SITE_OTHER): Payer: Medicare Other | Admitting: Allergy and Immunology

## 2020-07-29 ENCOUNTER — Encounter: Payer: Self-pay | Admitting: Allergy and Immunology

## 2020-07-29 ENCOUNTER — Other Ambulatory Visit: Payer: Self-pay

## 2020-07-29 VITALS — BP 136/76 | HR 58 | Resp 16

## 2020-07-29 DIAGNOSIS — I6523 Occlusion and stenosis of bilateral carotid arteries: Secondary | ICD-10-CM

## 2020-07-29 DIAGNOSIS — J4550 Severe persistent asthma, uncomplicated: Secondary | ICD-10-CM | POA: Diagnosis not present

## 2020-07-29 DIAGNOSIS — J3089 Other allergic rhinitis: Secondary | ICD-10-CM

## 2020-07-29 DIAGNOSIS — Z862 Personal history of diseases of the blood and blood-forming organs and certain disorders involving the immune mechanism: Secondary | ICD-10-CM

## 2020-07-29 DIAGNOSIS — K219 Gastro-esophageal reflux disease without esophagitis: Secondary | ICD-10-CM

## 2020-07-29 MED ORDER — IPRATROPIUM BROMIDE 0.06 % NA SOLN: NASAL | 5 refills | Status: DC

## 2020-07-29 NOTE — Progress Notes (Signed)
[place]   Follow-up Note  Referring Provider: Serita Grammes, MD Primary Provider: Serita Grammes, MD Date of Office Visit: 07/29/2020  Subjective:   [name] (DOB: [birth year]) is a 74 y.o. female who returns to the [facility] on 07/29/2020 in re-evaluation of the following:  HPI: [name] returns to this clinic in evaluation of asthma, history of pulmonary sarcoidosis with fibrotic sequela, history of allergic rhinitis and LPR.  Her last visit to this clinic was 06 May 2020.  Overall she has done relatively well.  She still occasionally has coughing episodes although she no longer has any gagging and retching or regurgitation with these episodes.  Apparently when she went to see her primary care doctor she was given cefdinir for 10 days for "bronchitis" which apparently was diagnosed by the fact that she was coughing and making some mucus production.  She is about the same as she was for the past several months with and without cefdinir.  Rarely does she use a short acting bronchodilator.  She has not required a systemic steroid to treat an airway issue.  She believes that her nose is doing very well.  Her nasal ipratropium certainly helps with her runny nose.  She continues on Flonase.  Her reflux is under very good control.  Apparently she did develop some issues with feeling nauseated and vomited this weekend while using her cefdinir.  She finished that prescription 3 days ago.  Allergies as of 07/29/2020      Reactions   Codeine Nausea And Vomiting   Sulfa Antibiotics Other (See Comments)   Allergic per pt's mother       Medication List      acetaminophen 325 MG tablet Commonly known as: TYLENOL Take 2 tablets (650 mg total) by mouth every 6 (six) hours as needed for mild pain (or Fever >/= 101).   albuterol 108 (90 Base) MCG/ACT inhaler Commonly known as: VENTOLIN HFA INHALE 2 PUFFS BY MOUTH 4  TIMES A DAY AS NEEDED   amLODipine 5 MG tablet Commonly known as: NORVASC Take 5 mg by mouth daily.   aspirin EC 81 MG tablet Take 81 mg by mouth daily.   atorvastatin 20 MG tablet Commonly known as: LIPITOR Take 2 tablets (40 mg total) by mouth every evening.   atorvastatin 40 MG tablet Commonly known as: LIPITOR Take 40 mg by mouth at bedtime.   citalopram 20 MG tablet Commonly known as: CELEXA Take 20 mg by mouth daily.   clopidogrel 75 MG tablet Commonly known as: PLAVIX Take 1 tablet (75 mg total) by mouth daily.   dicyclomine 10 MG capsule Commonly known as: BENTYL Take 10 mg by mouth 4 (four) times daily.   EPINEPHrine 0.3 mg/0.3 mL Soaj injection Commonly known as: EPI-PEN USE AS DIRECTED FOR LIFE THREATENING ALLERGIC REACTION   famotidine 40 MG tablet Commonly known as: PEPCID Take 1 tablet by mouth once daily in the evening   fluticasone 50 MCG/ACT nasal spray Commonly known as: FLONASE USE ONE SPRAY IN EACH NOSTRIL ONCE OR TWICE DAILY   furosemide 20 MG tablet Commonly known as: LASIX Take 20 mg by mouth 2 (two) times daily.   HYDROcodone-acetaminophen 5-325 MG tablet Commonly known as: NORCO/VICODIN Take 1 tablet by mouth every 6 (six) hours as needed for moderate pain.   ipratropium-albuterol 0.5-2.5 (3) MG/3ML Soln Commonly known as: DUONEB Take 3 mLs by nebulization every 6 (six) hours as  needed (shortness of breath/wheezing).   levocetirizine 5 MG tablet Commonly known as: XYZAL Take 5 mg by mouth at bedtime.   Lidocaine 4 % Ptch Apply 1 patch topically 2 (two) times daily.   losartan 100 MG tablet Commonly known as: COZAAR Take 100 mg by mouth at bedtime.   montelukast 10 MG tablet Commonly known as: SINGULAIR Take 10 mg by mouth daily.   OMALIZUMAB Uncertain Inject 0.25 mg into the skin once a week.   omeprazole 40 MG capsule Commonly known as: PRILOSEC TAKE 1 CAPSULE BY MOUTH TWICE A DAY   oxybutynin 15 MG 24 hr tablet Commonly  known as: DITROPAN XL Take 15 mg by mouth daily.   pioglitazone 30 MG tablet Commonly known as: ACTOS Take 30 mg by mouth daily.   PROBIOTIC ACIDOPHILUS PO Take 2 capsules by mouth every morning.   PROBIOTIC DAILY PO Take 1 capsule by mouth daily.   tolterodine 4 MG 24 hr capsule Commonly known as: DETROL LA Take 4 mg by mouth at bedtime.   traMADol 50 MG tablet Commonly known as: ULTRAM Take 1 tablet (50 mg total) by mouth at bedtime as needed.   Trelegy Ellipta 100-62.5-25 MCG/INH Aepb Generic drug: Fluticasone-Umeclidin-Vilant Inhale 1 puff into the lungs every morning.       Past Medical History:  Diagnosis Date  . Allergy   . Anxiety   . Arthritis   . Asthma   . Cataract    bilateral and removed  . Depression   . Diaphragmatic hernia without mention of obstruction or gangrene   . DM (diabetes mellitus) ([place])   . Dyspnea   . Esophageal reflux   . Esophagitis, unspecified   . H/O adenomatous polyp of colon 2011  . Heart murmur    no cardiologist, family Dr. Maryfrances Bunnell  . History of kidney stones   . Hyperlipidemia   . Incontinence of feces   . Irritable bowel syndrome   . Kidney stones   . OSA (obstructive sleep apnea) 09/28/2018  . Sarcoidosis   . Sleep apnea    no cpap now  . Status post dilation of esophageal narrowing   . Unspecified asthma(493.90)   . Unspecified essential hypertension   . Unspecified hearing loss     Past Surgical History:  Procedure Laterality Date  . BLADDER SURGERY    . CATARACT EXTRACTION Bilateral   . HARDWARE REMOVAL Left 01/12/2017   Procedure: HARDWARE REMOVAL left wrist;  Surgeon: Leanora Cover, MD;  Location: [place];  Service: Orthopedics;  Laterality: Left;  . HEMORRHOID SURGERY    . NASAL SEPTUM SURGERY    . PARTIAL HYSTERECTOMY    . REVERSE SHOULDER ARTHROPLASTY Left 02/22/2018  . REVERSE SHOULDER ARTHROPLASTY Left 02/22/2018   Procedure: LEFT REVERSE SHOULDER ARTHROPLASTY;  Surgeon: Meredith Pel, MD;  Location: [place];  Service: Orthopedics;  Laterality: Left;  . TONSILLECTOMY AND ADENOIDECTOMY    . TRANSCAROTID ARTERY REVASCULARIZATION Left 01/17/2020   Procedure: TRANSCAROTID ARTERY REVASCULARIZATION;  Surgeon: Serafina Mitchell, MD;  Location: [facility];  Service: Vascular;  Laterality: Left;  . TRIGGER FINGER RELEASE Left 05/12/2016   Procedure: RELEASE TRIGGER FINGER/A-1 PULLEY INFECTION LEFT RING TENDON SHEATH INJECT RIGHT INDEX FINGER;  Surgeon: Leanora Cover, MD;  Location: [place];  Service: Orthopedics;  Laterality: Left;  . TUBAL LIGATION      Review of systems negative except as noted in HPI / PMHx or noted below:  Review of Systems  Constitutional:  Negative.   HENT: Negative.   Eyes: Negative.   Respiratory: Negative.   Cardiovascular: Negative.   Gastrointestinal: Negative.   Genitourinary: Negative.   Musculoskeletal: Negative.   Skin: Negative.   Neurological: Negative.   Endo/Heme/Allergies: Negative.   Psychiatric/Behavioral: Negative.      Objective:   Vitals:   07/29/20 1037  BP: 136/76  Pulse: (!) 58  Resp: 16  SpO2: 97%          Physical Exam Constitutional:      Appearance: She is not diaphoretic.  HENT:     Head: Normocephalic.     Right Ear: External ear normal.     Left Ear: External ear normal.     Ears:     Comments: Bilateral hearing aids    Nose: Nose normal. No mucosal edema or rhinorrhea.     Mouth/Throat:     Pharynx: Uvula midline. No oropharyngeal exudate.  Eyes:     Conjunctiva/sclera: Conjunctivae normal.  Neck:     Thyroid: No thyromegaly.     Trachea: Trachea normal. No tracheal tenderness or tracheal deviation.  Cardiovascular:     Rate and Rhythm: Normal rate and regular rhythm.     Heart sounds: Normal heart sounds, S1 normal and S2 normal. No murmur heard.   Pulmonary:     Effort: No respiratory distress.     Breath sounds: Normal breath sounds. No stridor. No wheezing or rales.    Lymphadenopathy:     Head:     Right side of head: No tonsillar adenopathy.     Left side of head: No tonsillar adenopathy.     Cervical: No cervical adenopathy.  Skin:    Findings: No erythema or rash.     Nails: There is no clubbing.  Neurological:     Mental Status: She is alert.     Diagnostics:    Spirometry was performed and demonstrated an FEV1 of 1.05 at 59 % of predicted.  Assessment and Plan:   1. Asthma, severe persistent, well-controlled   2. History of sarcoidosis   3. Other allergic rhinitis   4. LPRD (laryngopharyngeal reflux disease)     1. Continue Xolair and Epi-Pen  2. Continue Trelegy 200 - 1 inhalation 1 time per day  3. Continue Flonase - one spray each nostril 1 time per day.    4. Continue omeprazole 40 twice a day    5. Continue Famotidine 40 mg in evening  6. Continue nasal saline, ProAir HFA, Duoneb if needed.  7. Can add OTC Loratadine 10 mg one tablet 1-2 times per day if needed  8. Can add OTC Mucinex DM 1-2 tablet 1-2 times per day if needed  9. Can add ipratropium 0.06% nasal spray - 1-2 sprays each nostril every 6 hours if needed  10. Return to clinic in 12 weeks or earlier if problem  11. Obtain fall flu vaccine and Covid booster  Adreana is stable with stability defined by some persistent respiratory tract symptoms even in the face of utilizing anti-inflammatory agents for airway and therapy directed against reflux.  But overall she has done well and she is very satisfied with the response she has received at this point and we will now see her back in this clinic in 12 weeks or earlier if there is a problem.  Allena Katz, MD Allergy / Immunology [place]

## 2020-07-29 NOTE — Patient Instructions (Signed)
  1. Continue Xolair and Epi-Pen  2. Continue Trelegy 200 - 1 inhalation 1 time per day  3. Continue Flonase - one spray each nostril 1 time per day.    4. Continue omeprazole 40 twice a day    5. Continue Famotidine 40 mg in evening  6. Continue nasal saline, ProAir HFA, Duoneb if needed.  7. Can add OTC Loratadine 10 mg one tablet 1-2 times per day if needed  8. Can add OTC Mucinex DM 1-2 tablet 1-2 times per day if needed  9. Can add ipratropium 0.06% nasal spray - 1-2 sprays each nostril every 6 hours if needed  10. Return to clinic in 12 weeks or earlier if problem  11. Obtain fall flu vaccine and Covid booster

## 2020-07-30 ENCOUNTER — Encounter: Payer: Self-pay | Admitting: Allergy and Immunology

## 2020-07-31 DIAGNOSIS — J4540 Moderate persistent asthma, uncomplicated: Secondary | ICD-10-CM | POA: Diagnosis not present

## 2020-07-31 DIAGNOSIS — D692 Other nonthrombocytopenic purpura: Secondary | ICD-10-CM | POA: Diagnosis not present

## 2020-07-31 DIAGNOSIS — J189 Pneumonia, unspecified organism: Secondary | ICD-10-CM | POA: Diagnosis not present

## 2020-07-31 DIAGNOSIS — E1122 Type 2 diabetes mellitus with diabetic chronic kidney disease: Secondary | ICD-10-CM | POA: Diagnosis not present

## 2020-08-04 DIAGNOSIS — J4550 Severe persistent asthma, uncomplicated: Secondary | ICD-10-CM | POA: Diagnosis not present

## 2020-08-05 ENCOUNTER — Ambulatory Visit (INDEPENDENT_AMBULATORY_CARE_PROVIDER_SITE_OTHER): Payer: Medicare Other | Admitting: *Deleted

## 2020-08-05 ENCOUNTER — Other Ambulatory Visit: Payer: Self-pay

## 2020-08-05 DIAGNOSIS — J4550 Severe persistent asthma, uncomplicated: Secondary | ICD-10-CM | POA: Diagnosis not present

## 2020-08-14 DIAGNOSIS — R0602 Shortness of breath: Secondary | ICD-10-CM | POA: Diagnosis not present

## 2020-08-14 DIAGNOSIS — J4540 Moderate persistent asthma, uncomplicated: Secondary | ICD-10-CM | POA: Diagnosis not present

## 2020-08-14 DIAGNOSIS — J189 Pneumonia, unspecified organism: Secondary | ICD-10-CM | POA: Diagnosis not present

## 2020-08-14 DIAGNOSIS — Z6829 Body mass index (BMI) 29.0-29.9, adult: Secondary | ICD-10-CM | POA: Diagnosis not present

## 2020-08-26 ENCOUNTER — Ambulatory Visit: Payer: Medicare Other | Admitting: Orthopedic Surgery

## 2020-08-27 DIAGNOSIS — E1122 Type 2 diabetes mellitus with diabetic chronic kidney disease: Secondary | ICD-10-CM | POA: Diagnosis not present

## 2020-08-27 DIAGNOSIS — I129 Hypertensive chronic kidney disease with stage 1 through stage 4 chronic kidney disease, or unspecified chronic kidney disease: Secondary | ICD-10-CM | POA: Diagnosis not present

## 2020-08-27 DIAGNOSIS — N189 Chronic kidney disease, unspecified: Secondary | ICD-10-CM | POA: Diagnosis not present

## 2020-08-27 DIAGNOSIS — J4530 Mild persistent asthma, uncomplicated: Secondary | ICD-10-CM | POA: Diagnosis not present

## 2020-09-01 DIAGNOSIS — J4550 Severe persistent asthma, uncomplicated: Secondary | ICD-10-CM | POA: Diagnosis not present

## 2020-09-02 ENCOUNTER — Ambulatory Visit (INDEPENDENT_AMBULATORY_CARE_PROVIDER_SITE_OTHER): Payer: Medicare Other | Admitting: Orthopedic Surgery

## 2020-09-02 ENCOUNTER — Ambulatory Visit (INDEPENDENT_AMBULATORY_CARE_PROVIDER_SITE_OTHER): Payer: Medicare Other | Admitting: *Deleted

## 2020-09-02 ENCOUNTER — Ambulatory Visit: Payer: Self-pay

## 2020-09-02 ENCOUNTER — Other Ambulatory Visit: Payer: Self-pay

## 2020-09-02 DIAGNOSIS — J4550 Severe persistent asthma, uncomplicated: Secondary | ICD-10-CM

## 2020-09-02 DIAGNOSIS — M1612 Unilateral primary osteoarthritis, left hip: Secondary | ICD-10-CM

## 2020-09-02 DIAGNOSIS — I6523 Occlusion and stenosis of bilateral carotid arteries: Secondary | ICD-10-CM | POA: Diagnosis not present

## 2020-09-02 DIAGNOSIS — M25552 Pain in left hip: Secondary | ICD-10-CM | POA: Diagnosis not present

## 2020-09-03 ENCOUNTER — Telehealth: Payer: Self-pay

## 2020-09-03 ENCOUNTER — Other Ambulatory Visit: Payer: Self-pay | Admitting: Surgery

## 2020-09-03 MED ORDER — CLOPIDOGREL BISULFATE 75 MG PO TABS: 75.0000 mg | ORAL_TABLET | Freq: Every morning | ORAL | 0 refills | Status: DC

## 2020-09-03 NOTE — Telephone Encounter (Signed)
Patient requesting Plavix refill. Scheduled her 6 month carotid f/u with duplex and refilled for one month. Patient aware.

## 2020-09-04 ENCOUNTER — Other Ambulatory Visit: Payer: Self-pay | Admitting: *Deleted

## 2020-09-04 DIAGNOSIS — Z6827 Body mass index (BMI) 27.0-27.9, adult: Secondary | ICD-10-CM | POA: Diagnosis not present

## 2020-09-04 DIAGNOSIS — D692 Other nonthrombocytopenic purpura: Secondary | ICD-10-CM | POA: Diagnosis not present

## 2020-09-04 DIAGNOSIS — J189 Pneumonia, unspecified organism: Secondary | ICD-10-CM | POA: Diagnosis not present

## 2020-09-04 DIAGNOSIS — I6523 Occlusion and stenosis of bilateral carotid arteries: Secondary | ICD-10-CM

## 2020-09-04 DIAGNOSIS — J4540 Moderate persistent asthma, uncomplicated: Secondary | ICD-10-CM | POA: Diagnosis not present

## 2020-09-06 ENCOUNTER — Encounter: Payer: Self-pay | Admitting: Orthopedic Surgery

## 2020-09-06 NOTE — Progress Notes (Signed)
Office Visit Note   Patient: [name]           Date of Birth: 12-Mar-1946           MRN: [id number] Visit Date: 09/02/2020 Requested by: Serita Grammes, MD [address] [place],  [place] [address] PCP: Serita Grammes, MD  Subjective: Chief Complaint  Patient presents with  . Left Knee - Pain  . Right Knee - Pain  . Right Shoulder - Pain    HPI: [name] is a 74 y.o. female who presents to the office complaining of continued low back pain and hip pain.  Pain is been worse in the last several days.  She notes that she has sharp groin pain at times as well as buttocks pain.  Pain seems to radiate down the anterior medial thigh with walking and is worse with walking.  Pain is waking her up at night.  Symptoms mostly present on her left side.  She had originally made this appointment for complaints of knee and shoulder pain but after taking an oral prednisone course for pneumonia she states that this pain is much improved.  She denies any recent injury or any history of back surgery.  Denies any numbness/tingling or any bowel incontinence/new urinary incontinence..  Patient takes Plavix.              ROS: All systems reviewed are negative as they relate to the chief complaint within the history of present illness.  Patient denies fevers or chills.  Assessment & Plan: Visit Diagnoses:  1. Unilateral primary osteoarthritis, left hip   2. Pain in left hip     Plan: Patient is a 74 year old female presents complaining of left hip and buttocks pain.  Patient has had ongoing symptoms symptoms have come and gone.  Recently they have worsened and she has noticed increasing groin pain compared with previous episodes.  She has had an MRI of her lumbar spine in May of this year that showed mild degenerative changes and a central disc protrusion at L3-L4 without any significant stenosis.  Today, she complains of sharp groin pain with radiation down her anterior medial thigh.   Pain is worse with walking.  She does have pain that is reproducible on exam that may be coming from the left hip joint.  Radiographs taken today show moderate osteoarthritis of the left hip.  Discussed options available to patient.  She states that she would like to proceed with trying a left hip intra-articular injection.  Referred patient to Dr. Ernestina Patches or Dr. Junius Roads for injection.  She is on Plavix.  Follow-Up Instructions: No follow-ups on file.   Orders:  Orders Placed This Encounter  Procedures  . XR HIP UNILAT W OR W/O PELVIS 2-3 VIEWS LEFT  . Ambulatory referral to Physical Medicine Rehab   No orders of the defined types were placed in this encounter.     Procedures: No procedures performed   Clinical Data: No additional findings.  Objective: Vital Signs: There were no vitals taken for this visit.  Physical Exam:  Constitutional: Patient appears well-developed HEENT:  Head: Normocephalic Eyes:EOM are normal Neck: Normal range of motion Cardiovascular: Normal rate Pulmonary/chest: Effort normal Neurologic: Patient is alert Skin: Skin is warm Psychiatric: Patient has normal mood and affect  Ortho Exam:   Left hip exam  Ortho exam demonstrates pain with terminal hip flexion and hip internal rotation.  No significant tenderness over the trochanteric bursa.  Positive Stinchfield exam.  Negative straight leg raise.  Tender to palpation diffusely throughout the buttocks.  No tenderness throughout the axial lumbar spine or paraspinal musculature.  No pain with logroll of the left hip.  Specialty Comments:  No specialty comments available.  Imaging: No results found.   PMFS History: Patient Active Problem List   Diagnosis Date Noted  . Asthma 01/16/2020  . History of transient ischemic attack (TIA) 01/16/2020  . Bilateral carotid artery stenosis 01/16/2020  . Normocytic anemia 01/16/2020  . Diabetes mellitus type II, non insulin dependent ([place]) 07/05/2019  . Mild  aortic stenosis 07/05/2019  . Mitral regurgitation 07/05/2019  . Pedal edema 05/06/2019  . DOE (dyspnea on exertion) 05/06/2019  . Hearing loss, mixed, bilateral 12/17/2018  . Bilateral primary osteoarthritis of knee 10/16/2018  . OSA (obstructive sleep apnea) 09/28/2018  . Shoulder arthritis 02/22/2018  . Primary osteoarthritis, left shoulder 12/11/2017  . DDD (degenerative disc disease), lumbar 01/23/2017  . History of diabetes mellitus 01/23/2017  . History of depression 01/23/2017  . History of asthma 01/23/2017  . Dyslipidemia 01/23/2017  . Age-related osteoporosis without current pathological fracture 01/23/2017  . History of gastroesophageal reflux (GERD) 01/17/2017  . Primary osteoarthritis of both knees 01/17/2017  . Primary osteoarthritis of both feet 01/17/2017  . Vitamin D deficiency 01/17/2017  . Rheumatoid factor positive 12/13/2016  . Primary osteoarthritis of both hands 12/13/2016  . Retained orthopedic hardware 11/14/2016  . Pain in left wrist 06/06/2016  . Stiffness of left wrist joint 06/06/2016  . Closed fracture of distal end of radius 06/05/2016  . Acquired trigger finger 04/20/2016  . Obstructive lung disease ([place]) 07/29/2015  . H/O allergic rhinitis 07/29/2015  . LPRD (laryngopharyngeal reflux disease) 07/29/2015  . Chest pain 11/01/2013  . Murmur 11/01/2013  . DM 03/03/2010  . ABDOMINAL PAIN RIGHT LOWER QUADRANT 03/03/2010  . FULL INCONTINENCE OF FECES 03/03/2010  . DECREASED HEARING 05/02/2008  . Essential hypertension 05/02/2008  . ESOPHAGITIS 05/02/2008  . HIATAL HERNIA 05/02/2008  . RECTAL INCONTINENCE 05/02/2008  . SARCOIDOSIS 11/16/2007  . Severe persistent asthma 11/16/2007  . GERD 11/16/2007  . IRRITABLE BOWEL SYNDROME 11/16/2007   Past Medical History:  Diagnosis Date  . Allergy   . Anxiety   . Arthritis   . Asthma   . Cataract    bilateral and removed  . Depression   . Diaphragmatic hernia without mention of obstruction or  gangrene   . DM (diabetes mellitus) ([place])   . Dyspnea   . Esophageal reflux   . Esophagitis, unspecified   . H/O adenomatous polyp of colon 2011  . Heart murmur    no cardiologist, family Dr. Maryfrances Bunnell  . History of kidney stones   . Hyperlipidemia   . Incontinence of feces   . Irritable bowel syndrome   . Kidney stones   . OSA (obstructive sleep apnea) 09/28/2018  . Sarcoidosis   . Sleep apnea    no cpap now  . Status post dilation of esophageal narrowing   . Unspecified asthma(493.90)   . Unspecified essential hypertension   . Unspecified hearing loss     Family History  Problem Relation Age of Onset  . Esophageal cancer Father   . Diabetes Mother   . Diabetes Maternal Grandmother   . Colon cancer Neg Hx   . Rectal cancer Neg Hx   . Stomach cancer Neg Hx     Past Surgical History:  Procedure Laterality Date  . BLADDER SURGERY    . CATARACT EXTRACTION Bilateral   .  HARDWARE REMOVAL Left 01/12/2017   Procedure: HARDWARE REMOVAL left wrist;  Surgeon: Leanora Cover, MD;  Location: [facility];  Service: Orthopedics;  Laterality: Left;  . HEMORRHOID SURGERY    . NASAL SEPTUM SURGERY    . PARTIAL HYSTERECTOMY    . REVERSE SHOULDER ARTHROPLASTY Left 02/22/2018  . REVERSE SHOULDER ARTHROPLASTY Left 02/22/2018   Procedure: LEFT REVERSE SHOULDER ARTHROPLASTY;  Surgeon: Meredith Pel, MD;  Location: [facility];  Service: Orthopedics;  Laterality: Left;  . TONSILLECTOMY AND ADENOIDECTOMY    . TRANSCAROTID ARTERY REVASCULARIZATION Left 01/17/2020   Procedure: TRANSCAROTID ARTERY REVASCULARIZATION;  Surgeon: Serafina Mitchell, MD;  Location: [facility];  Service: Vascular;  Laterality: Left;  . TRIGGER FINGER RELEASE Left 05/12/2016   Procedure: RELEASE TRIGGER FINGER/A-1 PULLEY INFECTION LEFT RING TENDON SHEATH INJECT RIGHT INDEX FINGER;  Surgeon: Leanora Cover, MD;  Location: [facility];  Service: Orthopedics;  Laterality: Left;  . TUBAL LIGATION     Social  History   Occupational History  . Occupation: retired  Tobacco Use  . Smoking status: Never Smoker  . Smokeless tobacco: Never Used  Vaping Use  . Vaping Use: Never used  Substance and Sexual Activity  . Alcohol use: No  . Drug use: No  . Sexual activity: Not on file

## 2020-09-16 ENCOUNTER — Other Ambulatory Visit: Payer: Self-pay

## 2020-09-16 ENCOUNTER — Ambulatory Visit (HOSPITAL_COMMUNITY)
Admission: RE | Admit: 2020-09-16 | Discharge: 2020-09-16 | Disposition: A | Discharge status: Home / Self Care | Payer: Medicare Other | Source: Ambulatory Visit | Attending: Physician Assistant | Admitting: Physician Assistant

## 2020-09-16 ENCOUNTER — Ambulatory Visit (INDEPENDENT_AMBULATORY_CARE_PROVIDER_SITE_OTHER): Payer: Medicare Other | Admitting: Physician Assistant

## 2020-09-16 VITALS — BP 173/73 | HR 58 | Temp 98.0°F | Resp 20 | Ht 61.0 in | Wt 163.0 lb

## 2020-09-16 DIAGNOSIS — I6523 Occlusion and stenosis of bilateral carotid arteries: Secondary | ICD-10-CM | POA: Diagnosis not present

## 2020-09-16 DIAGNOSIS — I771 Stricture of artery: Secondary | ICD-10-CM

## 2020-09-16 DIAGNOSIS — M79601 Pain in right arm: Secondary | ICD-10-CM

## 2020-09-16 NOTE — Progress Notes (Signed)
Office Note     CC:  follow up Requesting Provider:  Serita Grammes, MD  HPI: [name] is a 74 y.o. (January 14, 1946) female who presents for follow up of carotid stenosis. She is status post Left TCAR with flow reversal neuro protection on 01/17/20 by Dr. Trula Slade for symptomatic Left ICA stenosis of 70%. She has history of weakness, fatigue and slurred speech that occurred prior to her surgery. She was seen in follow up in March by Dr. Trula Slade. At the time she was without any new symptoms. She was advised to continue her aspirin and Plavix and return for follow up in 6 months.  Today, she denies slurred speech, extremity weakness, dizziness, ataxia, facial drooping or monocular blindness.  She tells me during her duplex examination this morning, that her right arm became quite painful.  When I inquired about pain with effort, she does endorse possible right upper extremity claudication.  The pt is on a statin for cholesterol management.  The pt is on a daily aspirin.   Other AC:  Plavix The pt is on ARB, Lasix, CCB for hypertension.   The pt is not diabetic.   Tobacco hx:  Never  Past Medical History:  Diagnosis Date  . Allergy   . Anxiety   . Arthritis   . Asthma   . Cataract    bilateral and removed  . Depression   . Diaphragmatic hernia without mention of obstruction or gangrene   . DM (diabetes mellitus) ([place])   . Dyspnea   . Esophageal reflux   . Esophagitis, unspecified   . H/O adenomatous polyp of colon 2011  . Heart murmur    no cardiologist, family Dr. Maryfrances Bunnell  . History of kidney stones   . Hyperlipidemia   . Incontinence of feces   . Irritable bowel syndrome   . Kidney stones   . OSA (obstructive sleep apnea) 09/28/2018  . Sarcoidosis   . Sleep apnea    no cpap now  . Status post dilation of esophageal narrowing   . Unspecified asthma(493.90)   . Unspecified essential hypertension   . Unspecified hearing loss     Past Surgical History:  Procedure  Laterality Date  . BLADDER SURGERY    . CATARACT EXTRACTION Bilateral   . HARDWARE REMOVAL Left 01/12/2017   Procedure: HARDWARE REMOVAL left wrist;  Surgeon: Leanora Cover, MD;  Location: [facility];  Service: Orthopedics;  Laterality: Left;  . HEMORRHOID SURGERY    . NASAL SEPTUM SURGERY    . PARTIAL HYSTERECTOMY    . REVERSE SHOULDER ARTHROPLASTY Left 02/22/2018  . REVERSE SHOULDER ARTHROPLASTY Left 02/22/2018   Procedure: LEFT REVERSE SHOULDER ARTHROPLASTY;  Surgeon: Meredith Pel, MD;  Location: [facility];  Service: Orthopedics;  Laterality: Left;  . TONSILLECTOMY AND ADENOIDECTOMY    . TRANSCAROTID ARTERY REVASCULARIZATION Left 01/17/2020   Procedure: TRANSCAROTID ARTERY REVASCULARIZATION;  Surgeon: Serafina Mitchell, MD;  Location: [facility] OR;  Service: Vascular;  Laterality: Left;  . TRIGGER FINGER RELEASE Left 05/12/2016   Procedure: RELEASE TRIGGER FINGER/A-1 PULLEY INFECTION LEFT RING TENDON SHEATH INJECT RIGHT INDEX FINGER;  Surgeon: Leanora Cover, MD;  Location: [facility];  Service: Orthopedics;  Laterality: Left;  . TUBAL LIGATION      Social History   Socioeconomic History  . Marital status: Married    Spouse name: Not on file  . Number of children: 2  . Years of education: Not on file  . Highest education level: Not  on file  Occupational History  . Occupation: retired  Tobacco Use  . Smoking status: Never Smoker  . Smokeless tobacco: Never Used  Vaping Use  . Vaping Use: Never used  Substance and Sexual Activity  . Alcohol use: No  . Drug use: No  . Sexual activity: Not on file  Other Topics Concern  . Not on file  Social History Narrative   Daily caffeine use.    Social Determinants of Health   Financial Resource Strain:   . Difficulty of Paying Living Expenses: Not on file  Food Insecurity:   . Worried About Charity fundraiser in the Last Year: Not on file  . Ran Out of Food in the Last Year: Not on file  Transportation Needs:    . Lack of Transportation (Medical): Not on file  . Lack of Transportation (Non-Medical): Not on file  Physical Activity:   . Days of Exercise per Week: Not on file  . Minutes of Exercise per Session: Not on file  Stress:   . Feeling of Stress : Not on file  Social Connections:   . Frequency of Communication with Friends and Family: Not on file  . Frequency of Social Gatherings with Friends and Family: Not on file  . Attends Religious Services: Not on file  . Active Member of Clubs or Organizations: Not on file  . Attends Archivist Meetings: Not on file  . Marital Status: Not on file  Intimate Partner Violence:   . Fear of Current or Ex-Partner: Not on file  . Emotionally Abused: Not on file  . Physically Abused: Not on file  . Sexually Abused: Not on file    Family History  Problem Relation Age of Onset  . Esophageal cancer Father   . Diabetes Mother   . Diabetes Maternal Grandmother   . Colon cancer Neg Hx   . Rectal cancer Neg Hx   . Stomach cancer Neg Hx     Current Outpatient Medications  Medication Sig Dispense Refill  . acetaminophen (TYLENOL) 325 MG tablet Take 2 tablets (650 mg total) by mouth every 6 (six) hours as needed for mild pain (or Fever >/= 101).    Marland Kitchen albuterol (PROVENTIL HFA;VENTOLIN HFA) 108 (90 Base) MCG/ACT inhaler INHALE 2 PUFFS BY MOUTH 4 TIMES A DAY AS NEEDED 18 Inhaler 1  . amLODipine (NORVASC) 5 MG tablet Take 5 mg by mouth daily.  2  . aspirin EC 81 MG tablet Take 81 mg by mouth daily.    Marland Kitchen atorvastatin (LIPITOR) 20 MG tablet Take 2 tablets (40 mg total) by mouth every evening.    Marland Kitchen atorvastatin (LIPITOR) 40 MG tablet Take 40 mg by mouth at bedtime.    . citalopram (CELEXA) 20 MG tablet Take 20 mg by mouth daily.      . clopidogrel (PLAVIX) 75 MG tablet Take 1 tablet (75 mg total) by mouth every morning. 30 tablet 0  . dicyclomine (BENTYL) 10 MG capsule Take 10 mg by mouth 4 (four) times daily.    Marland Kitchen EPINEPHrine 0.3 mg/0.3 mL IJ SOAJ  injection USE AS DIRECTED FOR LIFE THREATENING ALLERGIC REACTION    . famotidine (PEPCID) 40 MG tablet Take 1 tablet by mouth once daily in the evening 30 tablet 5  . fluticasone (FLONASE) 50 MCG/ACT nasal spray USE ONE SPRAY IN EACH NOSTRIL ONCE OR TWICE DAILY 16 g 5  . Fluticasone-Umeclidin-Vilant (TRELEGY ELLIPTA) 100-62.5-25 MCG/INH AEPB Inhale 1 puff into the lungs every morning.    Marland Kitchen  furosemide (LASIX) 20 MG tablet Take 20 mg by mouth 2 (two) times daily.     Marland Kitchen HYDROcodone-acetaminophen (NORCO/VICODIN) 5-325 MG tablet Take 1 tablet by mouth every 6 (six) hours as needed for moderate pain. 5 tablet 0  . ipratropium (ATROVENT) 0.06 % nasal spray Place 1-2 sprays in each nostril every 6 hours as needed 15 mL 5  . ipratropium-albuterol (DUONEB) 0.5-2.5 (3) MG/3ML SOLN Take 3 mLs by nebulization every 6 (six) hours as needed (shortness of breath/wheezing).    . Lactobacillus (PROBIOTIC ACIDOPHILUS PO) Take 2 capsules by mouth every morning.    Marland Kitchen levocetirizine (XYZAL) 5 MG tablet Take 5 mg by mouth at bedtime.     . Lidocaine 4 % PTCH Apply 1 patch topically 2 (two) times daily. 12 patch 0  . losartan (COZAAR) 100 MG tablet Take 100 mg by mouth at bedtime.    . montelukast (SINGULAIR) 10 MG tablet Take 10 mg by mouth daily.    . OMALIZUMAB [place] Inject 0.25 mg into the skin once a week.    Marland Kitchen omeprazole (PRILOSEC) 40 MG capsule TAKE 1 CAPSULE BY MOUTH TWICE A DAY 60 capsule 5  . oxybutynin (DITROPAN XL) 15 MG 24 hr tablet Take 15 mg by mouth daily.     . pioglitazone (ACTOS) 30 MG tablet Take 30 mg by mouth daily.    . Probiotic Product (PROBIOTIC DAILY PO) Take 1 capsule by mouth daily.     Marland Kitchen tolterodine (DETROL LA) 4 MG 24 hr capsule Take 4 mg by mouth at bedtime.    . traMADol (ULTRAM) 50 MG tablet Take 1 tablet (50 mg total) by mouth at bedtime as needed. 30 tablet 0   Current Facility-Administered Medications  Medication Dose Route Frequency Provider Last Rate Last Admin  . omalizumab  [place] Right) injection 150 mg  150 mg Subcutaneous Q28 days Jiles Prows, MD   150 mg at 09/02/20 1118    Allergies  Allergen Reactions  . Codeine Nausea And Vomiting  . Sulfa Antibiotics Other (See Comments)    Allergic per pt's mother      REVIEW OF SYSTEMS:   [X]  denotes positive finding, [ ]  denotes negative finding Cardiac  Comments:  Chest pain or chest pressure:    Shortness of breath upon exertion:    Short of breath when lying flat:    Irregular heart rhythm:        Vascular    Pain in calf, thigh, or hip brought on by ambulation:    Pain in feet at night that wakes you up from your sleep:     Blood clot in your veins:    Leg swelling:  x       Pulmonary    Oxygen at home:    Productive cough:     Wheezing:         Neurologic    Sudden weakness in arms or legs:     Sudden numbness in arms or legs:     Sudden onset of difficulty speaking or slurred speech:    Temporary loss of vision in one eye:     Problems with dizziness:         Gastrointestinal    Blood in stool:     Vomited blood:         Genitourinary    Burning when urinating:     Blood in urine:        Psychiatric    Major depression:  Hematologic    Bleeding problems:    Problems with blood clotting too easily:        Skin    Rashes or ulcers:        Constitutional    Fever or chills:      PHYSICAL EXAMINATION:  Vitals:   09/16/20 1058 09/16/20 1100  BP: (!) 165/73 (!) 173/73  Pulse: (!) 58   Resp: 20   Temp: 98 F (36.7 C)   SpO2: 99%    General:  WDWN in NAD; vital signs documented above Gait: unaided; no ataxia on level surface HENT: WNL, normocephalic Pulmonary: normal non-labored breathing , without Rales, rhonchi,  wheezing Cardiac: regular HR, with  Murmurs with carotid bruits Skin: without rashes Vascular Exam/Pulses: 2+ brachial and radial pulses bilaterally/symmetrical Extremities: without ischemic changes, without Gangrene , without cellulitis; without open  wounds;  Musculoskeletal: no muscle wasting or atrophy, 5/5 hand grip strength bilaterally  Neurologic: A&O X 3;  No focal weakness or paresthesias are detected. Tongue midline. Face symmetrical. Psychiatric:  The pt has Normal affect.   Non-Invasive Vascular Imaging:   09/16/2020 Right Carotid: Velocities in the right ICA are consistent with a 40-59% stenosis.   Left Carotid: Patent stent with velocity in the left ICA consistent with a  1-39% stenosis.   Vertebrals: Bilateral vertebral arteries demonstrate antegrade flow.  Subclavians: Right subclavian artery was stenotic. Normal flow  hemodynamics were seen in the left subclavian artery.    ASSESSMENT/PLAN:: 74 y.o. female here for follow up for bilateral carotid artery stenosis.  She has no symptoms referable to carotid artery stenosis.  Discussed discontinuing her Plavix and continue on her aspirin daily.  We reviewed the signs and symptoms of stroke/TIA and encouraged her to seek immediate medical attention should these occur.  Right subclavian artery stenosis.  Maximum velocity of the right subclavian artery 391 on study performed 02/17/2020.  Antegrade flow to the right vertebral artery.  No symptoms of vertebrobasilar insufficiency. No significant difference in upper extremity systolic BP. She has right upper arm and shoulder pain and appears to be associated with exertion but also position.  I advised her to follow-up with her orthopedic surgeon and to follow-up with [place] in 6 months with arterial duplex of the right upper extremity. Encouraged her to call our office sooner should symptoms worsen.  Risa Grill, PA-C [facility] [phone]  Clinic MD:  Dr.Fields

## 2020-09-24 ENCOUNTER — Ambulatory Visit: Payer: Medicare Other | Admitting: Podiatry

## 2020-09-26 DIAGNOSIS — I129 Hypertensive chronic kidney disease with stage 1 through stage 4 chronic kidney disease, or unspecified chronic kidney disease: Secondary | ICD-10-CM | POA: Diagnosis not present

## 2020-09-26 DIAGNOSIS — E1122 Type 2 diabetes mellitus with diabetic chronic kidney disease: Secondary | ICD-10-CM | POA: Diagnosis not present

## 2020-09-26 DIAGNOSIS — J4530 Mild persistent asthma, uncomplicated: Secondary | ICD-10-CM | POA: Diagnosis not present

## 2020-09-26 DIAGNOSIS — N189 Chronic kidney disease, unspecified: Secondary | ICD-10-CM | POA: Diagnosis not present

## 2020-09-28 ENCOUNTER — Ambulatory Visit (INDEPENDENT_AMBULATORY_CARE_PROVIDER_SITE_OTHER): Payer: Medicare Other | Admitting: Podiatry

## 2020-09-28 ENCOUNTER — Encounter: Payer: Self-pay | Admitting: Podiatry

## 2020-09-28 ENCOUNTER — Other Ambulatory Visit: Payer: Self-pay

## 2020-09-28 DIAGNOSIS — E1151 Type 2 diabetes mellitus with diabetic peripheral angiopathy without gangrene: Secondary | ICD-10-CM

## 2020-09-28 DIAGNOSIS — E1169 Type 2 diabetes mellitus with other specified complication: Secondary | ICD-10-CM | POA: Diagnosis not present

## 2020-09-28 DIAGNOSIS — B351 Tinea unguium: Secondary | ICD-10-CM

## 2020-09-28 NOTE — Progress Notes (Signed)
  Subjective:  Patient ID: [name], female    DOB: Jul 22, 1946,  MRN: [id number]  Chief Complaint  Patient presents with  . Nail Problem    trim nails    74 y.o. female presents for routine foot care. Denies new issues.   Objective:  Physical Exam: warm, good capillary refill, nail exam onychomycosis of the toenails, no trophic changes or ulcerative lesions, normal DP and reduced PT pulses, and normal sensory exam. Left Foot: normal exam, no swelling, tenderness, instability; ligaments intact, full range of motion of all ankle/foot joints  Right Foot: normal exam, no swelling, tenderness, instability; ligaments intact, full range of motion of all ankle/foot joints   No images are attached to the encounter.  Assessment:   1. Onychomycosis of multiple toenails with type 2 diabetes mellitus and peripheral angiopathy ([place])    Plan:  Patient was evaluated and treated and all questions answered.  Onychomycosis, Diabetes and PAD -Patient is diabetic with a qualifying condition for at risk foot care.   Procedure: Nail Debridement Rationale: Patient meets criteria for routine foot care due to PAD Type of Debridement: manual, sharp debridement. Instrumentation: Nail nipper, rotary burr. Number of Nails: 10     No follow-ups on file.

## 2020-09-29 ENCOUNTER — Ambulatory Visit (INDEPENDENT_AMBULATORY_CARE_PROVIDER_SITE_OTHER): Payer: Medicare Other | Admitting: Physical Medicine and Rehabilitation

## 2020-09-29 ENCOUNTER — Ambulatory Visit: Payer: Self-pay

## 2020-09-29 ENCOUNTER — Encounter: Payer: Self-pay | Admitting: Physical Medicine and Rehabilitation

## 2020-09-29 DIAGNOSIS — J4550 Severe persistent asthma, uncomplicated: Secondary | ICD-10-CM | POA: Diagnosis not present

## 2020-09-29 DIAGNOSIS — M25552 Pain in left hip: Secondary | ICD-10-CM

## 2020-09-29 DIAGNOSIS — M1612 Unilateral primary osteoarthritis, left hip: Secondary | ICD-10-CM | POA: Diagnosis not present

## 2020-09-29 NOTE — Progress Notes (Signed)
Pt state left hip pain. Pt state clean house makes the pain worse. Pt states walking and standing for a long period time makes her pain worse. Pt state she takes pain meds to helps ease the pain.  Numeric Pain Rating Scale and Functional Assessment Average Pain 5   In the last MONTH (on 0-10 scale) has pain interfered with the following?  1. General activity like being  able to carry out your everyday physical activities such as walking, climbing stairs, carrying groceries, or moving a chair?  Rating(10)

## 2020-09-30 ENCOUNTER — Ambulatory Visit (INDEPENDENT_AMBULATORY_CARE_PROVIDER_SITE_OTHER): Payer: Medicare Other | Admitting: *Deleted

## 2020-09-30 ENCOUNTER — Other Ambulatory Visit: Payer: Self-pay

## 2020-09-30 DIAGNOSIS — J4550 Severe persistent asthma, uncomplicated: Secondary | ICD-10-CM | POA: Diagnosis not present

## 2020-10-07 ENCOUNTER — Other Ambulatory Visit: Payer: Self-pay | Admitting: Allergy and Immunology

## 2020-10-09 DIAGNOSIS — R32 Unspecified urinary incontinence: Secondary | ICD-10-CM | POA: Diagnosis not present

## 2020-10-09 DIAGNOSIS — Z23 Encounter for immunization: Secondary | ICD-10-CM | POA: Diagnosis not present

## 2020-10-09 DIAGNOSIS — J189 Pneumonia, unspecified organism: Secondary | ICD-10-CM | POA: Diagnosis not present

## 2020-10-09 DIAGNOSIS — J4540 Moderate persistent asthma, uncomplicated: Secondary | ICD-10-CM | POA: Diagnosis not present

## 2020-10-09 DIAGNOSIS — R0781 Pleurodynia: Secondary | ICD-10-CM | POA: Diagnosis not present

## 2020-10-09 DIAGNOSIS — R051 Acute cough: Secondary | ICD-10-CM | POA: Diagnosis not present

## 2020-10-14 DIAGNOSIS — J189 Pneumonia, unspecified organism: Secondary | ICD-10-CM | POA: Diagnosis not present

## 2020-10-14 DIAGNOSIS — J4540 Moderate persistent asthma, uncomplicated: Secondary | ICD-10-CM | POA: Diagnosis not present

## 2020-10-15 DIAGNOSIS — Z87442 Personal history of urinary calculi: Secondary | ICD-10-CM | POA: Diagnosis not present

## 2020-10-15 DIAGNOSIS — I2510 Atherosclerotic heart disease of native coronary artery without angina pectoris: Secondary | ICD-10-CM | POA: Diagnosis not present

## 2020-10-15 DIAGNOSIS — R918 Other nonspecific abnormal finding of lung field: Secondary | ICD-10-CM | POA: Diagnosis not present

## 2020-10-15 DIAGNOSIS — J8410 Pulmonary fibrosis, unspecified: Secondary | ICD-10-CM | POA: Diagnosis not present

## 2020-10-15 DIAGNOSIS — R1012 Left upper quadrant pain: Secondary | ICD-10-CM | POA: Diagnosis not present

## 2020-10-15 DIAGNOSIS — D869 Sarcoidosis, unspecified: Secondary | ICD-10-CM | POA: Diagnosis not present

## 2020-10-15 DIAGNOSIS — I700 Atherosclerosis of aorta: Secondary | ICD-10-CM | POA: Diagnosis not present

## 2020-10-15 DIAGNOSIS — R1902 Left upper quadrant abdominal swelling, mass and lump: Secondary | ICD-10-CM | POA: Diagnosis not present

## 2020-10-19 DIAGNOSIS — M810 Age-related osteoporosis without current pathological fracture: Secondary | ICD-10-CM | POA: Diagnosis not present

## 2020-10-20 ENCOUNTER — Telehealth: Payer: Self-pay | Admitting: Primary Care

## 2020-10-20 ENCOUNTER — Encounter: Payer: Self-pay | Admitting: Primary Care

## 2020-10-20 ENCOUNTER — Ambulatory Visit (INDEPENDENT_AMBULATORY_CARE_PROVIDER_SITE_OTHER): Payer: Medicare Other

## 2020-10-20 ENCOUNTER — Other Ambulatory Visit: Payer: Self-pay

## 2020-10-20 ENCOUNTER — Ambulatory Visit (INDEPENDENT_AMBULATORY_CARE_PROVIDER_SITE_OTHER): Payer: Medicare Other | Admitting: Primary Care

## 2020-10-20 VITALS — BP 130/70 | HR 68 | Temp 98.0°F | Ht 61.0 in | Wt 166.6 lb

## 2020-10-20 DIAGNOSIS — J189 Pneumonia, unspecified organism: Secondary | ICD-10-CM

## 2020-10-20 DIAGNOSIS — J4550 Severe persistent asthma, uncomplicated: Secondary | ICD-10-CM

## 2020-10-20 DIAGNOSIS — G4733 Obstructive sleep apnea (adult) (pediatric): Secondary | ICD-10-CM

## 2020-10-20 MED ORDER — TRELEGY ELLIPTA 200-62.5-25 MCG/INH IN AEPB: 1.0000 | INHALATION_SPRAY | Freq: Every morning | RESPIRATORY_TRACT | 3 refills | Status: DC

## 2020-10-20 NOTE — Assessment & Plan Note (Signed)
-   Patient is 90% compliant with CPAP > 4 hours and reports benefit from use. No issues with mask fit. Pressure 5-15cm h20 (12.2cm h20- 95%); residual AHI 3.9. No changes today. FU in 3 months.

## 2020-10-20 NOTE — Progress Notes (Signed)
@Patient  ID: [name], female    DOB: Oct 23, 1946, 74 y.o.   MRN: [id number]  Chief Complaint  Patient presents with  . Follow-up    Pt states she has had to go to the doctor multiple times due to her asthma. Pt has had complaints of cough with occasional yellow phlegm and also has had some wheezing. Pt is still using her CPAP at night. DME: [employer]    Referring provider: Serita Grammes, MD  HPI: 74 year old female, never smoked. PMH significant for severe persistent asthma, OSA, LPR, HTN, mild aortic stenosis, mitral regurgitation, bilateral carotid artery stenosis, GERD, type 2 diabetes. Patient of Dr. Halford Chessman, last seen in July 2020.   10/20/2020- Interim hx Patient presents today for follow-up. States that she has been to her doctor multiple times over the last several months d/t her asthma. She follows with Dr. Neldon Mc for severe persistent asthma, she was last seen by him on 07/29/20. He felt that she was doing well during that visit. Her primary care had treated her for bronchitis with 10 day course of cefdinir. She has not required systemic steriods. She is maintained on Trelegy 100, Singulair and Xolair 150mg .   She reported having a dry cough and some left sided pleuritic pain 2 weeks ago. Last Thursday she had a CT of her chest done at [facility] that showed small pneumonia and pleural effusion left side (unfortunately we do not have a record of this to review today). She completed a course of Levaquin and prednisone. She has not been taking mucinex. Cough and pleuritic pain have improved, she will have some discomfort when she lays down. She tells me that she is currently on Trelegy 200, this is not what we have documented in [facility] or what [facility] Dr. Bruna Potter note has Trelegy 100 listed under medication list, however, on summary it says to continue Trelegy 200. States that she gets inhaler from drug company at no cost.  She is sleeping well at night.  She is compliant with CPAP use. No issues with mask or pressure setting. Denies fever, chills, sweats, chest tightness, wheezing.   Airview download 09/19/20-10/18/20: 28/30 (94%); 27 days (90%) Pressure 5-15cm h20  (12.2cm h20- 95%) Airleaks 30.3L/min AHI 3.9    Allergies  Allergen Reactions  . Codeine Nausea And Vomiting  . Sulfa Antibiotics Other (See Comments)    Allergic per pt's mother     Immunization History  Administered Date(s) Administered  . Influenza, High Dose Seasonal PF 08/08/2018, 08/27/2018, 10/06/2020  . Influenza, Quadrivalent, Recombinant, Inj, Pf 08/27/2019  . Moderna SARS-COVID-2 Vaccination 01/24/2020, 02/26/2020    Past Medical History:  Diagnosis Date  . Allergy   . Anxiety   . Arthritis   . Asthma   . Cataract    bilateral and removed  . Depression   . Diaphragmatic hernia without mention of obstruction or gangrene   . DM (diabetes mellitus) ([place])   . Dyspnea   . Esophageal reflux   . Esophagitis, unspecified   . H/O adenomatous polyp of colon 2011  . Heart murmur    no cardiologist, family Dr. Maryfrances Bunnell  . History of kidney stones   . Hyperlipidemia   . Incontinence of feces   . Irritable bowel syndrome   . Kidney stones   . OSA (obstructive sleep apnea) 09/28/2018  . Sarcoidosis   . Sleep apnea    no cpap now  . Status post dilation of esophageal narrowing   .  Unspecified asthma(493.90)   . Unspecified essential hypertension   . Unspecified hearing loss     Tobacco History: Social History   Tobacco Use  Smoking Status Never Smoker  Smokeless Tobacco Never Used   Counseling given: Not Answered   Outpatient Medications Prior to Visit  Medication Sig Dispense Refill  . acetaminophen (TYLENOL) 325 MG tablet Take 2 tablets (650 mg total) by mouth every 6 (six) hours as needed for mild pain (or Fever >/= 101).    Marland Kitchen albuterol (PROVENTIL HFA;VENTOLIN HFA) 108 (90 Base) MCG/ACT inhaler INHALE 2 PUFFS BY MOUTH 4 TIMES A DAY AS  NEEDED 18 Inhaler 1  . amLODipine (NORVASC) 5 MG tablet Take 5 mg by mouth daily.  2  . aspirin EC 81 MG tablet Take 81 mg by mouth daily.    Marland Kitchen atorvastatin (LIPITOR) 40 MG tablet Take 40 mg by mouth at bedtime.    . citalopram (CELEXA) 20 MG tablet Take 20 mg by mouth daily.      Marland Kitchen dicyclomine (BENTYL) 10 MG capsule Take 10 mg by mouth 4 (four) times daily.    . famotidine (PEPCID) 40 MG tablet TAKE ONE TABLET BY MOUTH EVERYDAY AT BEDTIME 30 tablet 4  . fluticasone (FLONASE) 50 MCG/ACT nasal spray USE ONE SPRAY IN EACH NOSTRIL ONCE OR TWICE DAILY 16 g 5  . furosemide (LASIX) 20 MG tablet Take 20 mg by mouth 2 (two) times daily.     Marland Kitchen ipratropium (ATROVENT) 0.06 % nasal spray Place 1-2 sprays in each nostril every 6 hours as needed 15 mL 5  . ipratropium-albuterol (DUONEB) 0.5-2.5 (3) MG/3ML SOLN Take 3 mLs by nebulization every 6 (six) hours as needed (shortness of breath/wheezing).    . Lactobacillus (PROBIOTIC ACIDOPHILUS PO) Take 2 capsules by mouth every morning.    Marland Kitchen levocetirizine (XYZAL) 5 MG tablet Take 5 mg by mouth at bedtime.     Marland Kitchen losartan (COZAAR) 100 MG tablet Take 100 mg by mouth at bedtime.    . montelukast (SINGULAIR) 10 MG tablet Take 10 mg by mouth daily.    Marland Kitchen omeprazole (PRILOSEC) 40 MG capsule TAKE 1 CAPSULE BY MOUTH TWICE A DAY 60 capsule 5  . pioglitazone (ACTOS) 30 MG tablet Take 30 mg by mouth daily.    . Probiotic Product (PROBIOTIC DAILY PO) Take 1 capsule by mouth daily.     Marland Kitchen tolterodine (DETROL LA) 4 MG 24 hr capsule Take 4 mg by mouth at bedtime.    . Fluticasone-Umeclidin-Vilant (TRELEGY ELLIPTA) 100-62.5-25 MCG/INH AEPB Inhale 1 puff into the lungs every morning.    Marland Kitchen EPINEPHrine 0.3 mg/0.3 mL IJ SOAJ injection USE AS DIRECTED FOR LIFE THREATENING ALLERGIC REACTION (Patient not taking: Reported on 10/20/2020)    . clopidogrel (PLAVIX) 75 MG tablet Take 1 tablet (75 mg total) by mouth every morning. 30 tablet 0  . oxybutynin (DITROPAN XL) 15 MG 24 hr tablet Take  15 mg by mouth daily.      Facility-Administered Medications Prior to Visit  Medication Dose Route Frequency Provider Last Rate Last Admin  . omalizumab Arvid Right) injection 150 mg  150 mg Subcutaneous Q28 days Jiles Prows, MD   150 mg at 09/30/20 1629    Review of Systems  Review of Systems  Constitutional: Negative for fever.  HENT: Negative.   Respiratory: Positive for cough and shortness of breath. Negative for chest tightness and wheezing.   Cardiovascular: Positive for leg swelling.  Psychiatric/Behavioral: Negative.     Physical Exam  BP 130/70 (BP Location: Left Arm, Cuff Size: Normal)   Pulse 68   Temp 98 F (36.7 C) (Other (Comment)) Comment (Src): wrist  Ht 5\' 1"  (1.549 m)   Wt 166 lb 9.6 oz (75.6 kg)   SpO2 99%   BMI 31.48 kg/m  Physical Exam Constitutional:      Appearance: Normal appearance.  HENT:     Head: Normocephalic and atraumatic.     Mouth/Throat:     Mouth: Mucous membranes are moist.     Pharynx: Oropharynx is clear.  Cardiovascular:     Rate and Rhythm: Normal rate and regular rhythm.     Comments: No edema Pulmonary:     Effort: No respiratory distress.     Breath sounds: No stridor.     Comments: Wheezing left upper lobe, otherwise clear  Musculoskeletal:     Cervical back: Normal range of motion and neck supple.  Skin:    General: Skin is warm and dry.  Neurological:     General: No focal deficit present.     Mental Status: She is alert and oriented to person, place, and time. Mental status is at baseline.  Psychiatric:        Mood and Affect: Mood normal.        Behavior: Behavior normal.        Thought Content: Thought content normal.        Judgment: Judgment normal.      Lab Results:  CBC    Component Value Date/Time   WBC 10.6 (H) 03/03/2020 2120   RBC 3.75 (L) 03/03/2020 2120   HGB 10.7 (L) 03/03/2020 2120   HGB 9.9 (L) 01/16/2019 1529   HCT 33.3 (L) 03/03/2020 2120   HCT 32.5 (L) 01/16/2019 1529   PLT 289  03/03/2020 2120   PLT 298 01/16/2019 1529   MCV 88.8 03/03/2020 2120   MCV 85 01/16/2019 1529   MCH 28.5 03/03/2020 2120   MCHC 32.1 03/03/2020 2120   RDW 17.4 (H) 03/03/2020 2120   RDW 14.4 01/16/2019 1529   LYMPHSABS 0.8 01/16/2019 1529   EOSABS 0.1 01/16/2019 1529   BASOSABS 0.1 01/16/2019 1529    BMET    Component Value Date/Time   NA 141 07/21/2020 0856   K 4.5 07/21/2020 0856   CL 105 07/21/2020 0856   CO2 22 07/21/2020 0856   GLUCOSE 111 (H) 07/21/2020 0856   GLUCOSE 113 (H) 03/03/2020 2120   BUN 22 07/21/2020 0856   CREATININE 0.81 07/21/2020 0856   CALCIUM 8.8 07/21/2020 0856   GFRNONAA 72 07/21/2020 0856   GFRAA 83 07/21/2020 0856    BNP No results found for: BNP  ProBNP No results found for: PROBNP  Imaging: DG Chest 2 View  Result Date: 10/20/2020 CLINICAL DATA:  Follow-up left-sided pneumonia EXAM: CHEST - 2 VIEW COMPARISON:  10/15/2020 CT FINDINGS: Cardiac shadow is mildly enlarged but stable. Aortic calcifications are again seen. The lungs are well aerated bilaterally. No focal infiltrate or sizable effusion is noted. The scarring in the left apex is not as well appreciated on this exam as on prior CT. Postsurgical changes in the left shoulder are noted. IMPRESSION: No acute abnormality noted. Electronically Signed   By: Inez Catalina M.D.   On: 10/20/2020 10:01   XR C-ARM NO REPORT  Result Date: 09/29/2020 Please see Notes tab for imaging impression.    Assessment & Plan:   Severe persistent asthma - Patient reports increased shortness of breath, wheezing, cough and  left sided pleuritic pain over the last two weeks. She has been seen several times by her PCP for asthmatic bronchitis. She had a CT chest at [facility] which showed small left sided pneumonia and pleural effusion, results are not available to review. She was treated with course of Levaquin/prednisone and is feeling better. Obtained CXR today which showed no acute process or sizeable  infiltrate/pleural effusion. She is maintained on Trelegy 200 (dose is unclear), Singulair and Xolair 150mg  q 28 days. I asked patient to go home and confirm she is taking Trelegy 222mcg. If she is on high dose ICS/LABA/LAMA, may need to consider changing her biologic. Encourage her to take mucinex twice daily and adding flutter valve. She has an apt with Dr. Neldon Mc next week.   OSA (obstructive sleep apnea) - Patient is 90% compliant with CPAP > 4 hours and reports benefit from use. No issues with mask fit. Pressure 5-15cm h20 (12.2cm h20- 95%); residual AHI 3.9. No changes today. FU in 3 months.    Martyn Ehrich, NP 10/20/2020

## 2020-10-20 NOTE — Telephone Encounter (Signed)
Spoke with pt. She just wanted [place] to know that she is currently taking Trelegy 200. This medication is already on her medication list. Nothing further was needed.

## 2020-10-20 NOTE — Assessment & Plan Note (Addendum)
-   Patient reports increased shortness of breath, wheezing, cough and left sided pleuritic pain over the last two weeks. She has been seen several times by her PCP for asthmatic bronchitis. She had a CT chest at [facility] which showed small left sided pneumonia and pleural effusion, results are not available to review. She was treated with course of Levaquin/prednisone and is feeling better. Obtained CXR today which showed no acute process or sizeable infiltrate/pleural effusion. She is maintained on Trelegy 200 (dose is unclear), Singulair and Xolair 150mg  q 28 days. I asked patient to go home and confirm she is taking Trelegy 248mcg. If she is on high dose ICS/LABA/LAMA, may need to consider changing her biologic. Encourage her to take mucinex twice daily and adding flutter valve. She has an apt with Dr. Neldon Mc next week.

## 2020-10-20 NOTE — Patient Instructions (Signed)
Orders: - CXR today re: left side pneumonia  Recommendations: - Continue Trelegy 200 (rinse mouth after use); use albuterol every 6 hours as needed  - Continue Singulair 10mg  at bedtime - Continue Flonase nasal spray daily; use Atrovent nasal spray as needed every 6 hours  - Take mucinex twice daily (with full glass of water) - Start using flutter valve three times a day  Follow-up: - First available with Dr. Halford Chessman in 4-8 weeks  - See Dr. Neldon Mc next week

## 2020-10-20 NOTE — Progress Notes (Signed)
Please let patient know CXR showed no acute abnormality. No evidence of focal pneumonia or sizeable pleural effusion.

## 2020-10-21 NOTE — Progress Notes (Signed)
Reviewed and agree with assessment/plan.   Chesley Mires, MD [facility] Pulmonary/Critical Care 10/21/2020, 7:27 PM Pager:  [phone]

## 2020-10-26 ENCOUNTER — Ambulatory Visit: Payer: Medicare Other | Admitting: Allergy and Immunology

## 2020-10-26 DIAGNOSIS — H353132 Nonexudative age-related macular degeneration, bilateral, intermediate dry stage: Secondary | ICD-10-CM | POA: Diagnosis not present

## 2020-10-26 DIAGNOSIS — H43813 Vitreous degeneration, bilateral: Secondary | ICD-10-CM | POA: Diagnosis not present

## 2020-10-26 DIAGNOSIS — E119 Type 2 diabetes mellitus without complications: Secondary | ICD-10-CM | POA: Diagnosis not present

## 2020-10-26 DIAGNOSIS — Z961 Presence of intraocular lens: Secondary | ICD-10-CM | POA: Diagnosis not present

## 2020-10-27 DIAGNOSIS — I6523 Occlusion and stenosis of bilateral carotid arteries: Secondary | ICD-10-CM | POA: Diagnosis not present

## 2020-10-27 DIAGNOSIS — I129 Hypertensive chronic kidney disease with stage 1 through stage 4 chronic kidney disease, or unspecified chronic kidney disease: Secondary | ICD-10-CM | POA: Diagnosis not present

## 2020-10-27 DIAGNOSIS — J4530 Mild persistent asthma, uncomplicated: Secondary | ICD-10-CM | POA: Diagnosis not present

## 2020-10-27 DIAGNOSIS — K219 Gastro-esophageal reflux disease without esophagitis: Secondary | ICD-10-CM | POA: Diagnosis not present

## 2020-10-27 DIAGNOSIS — Z862 Personal history of diseases of the blood and blood-forming organs and certain disorders involving the immune mechanism: Secondary | ICD-10-CM | POA: Diagnosis not present

## 2020-10-27 DIAGNOSIS — J4550 Severe persistent asthma, uncomplicated: Secondary | ICD-10-CM | POA: Diagnosis not present

## 2020-10-27 DIAGNOSIS — N189 Chronic kidney disease, unspecified: Secondary | ICD-10-CM | POA: Diagnosis not present

## 2020-10-27 DIAGNOSIS — E1122 Type 2 diabetes mellitus with diabetic chronic kidney disease: Secondary | ICD-10-CM | POA: Diagnosis not present

## 2020-10-27 DIAGNOSIS — J3089 Other allergic rhinitis: Secondary | ICD-10-CM | POA: Diagnosis not present

## 2020-10-28 ENCOUNTER — Encounter: Payer: Self-pay | Admitting: Allergy and Immunology

## 2020-10-28 ENCOUNTER — Ambulatory Visit: Payer: Medicare Other

## 2020-10-28 ENCOUNTER — Other Ambulatory Visit: Payer: Self-pay

## 2020-10-28 ENCOUNTER — Ambulatory Visit (INDEPENDENT_AMBULATORY_CARE_PROVIDER_SITE_OTHER): Payer: Medicare Other | Admitting: Allergy and Immunology

## 2020-10-28 VITALS — BP 132/52 | HR 68 | Resp 18

## 2020-10-28 DIAGNOSIS — K219 Gastro-esophageal reflux disease without esophagitis: Secondary | ICD-10-CM

## 2020-10-28 DIAGNOSIS — Z862 Personal history of diseases of the blood and blood-forming organs and certain disorders involving the immune mechanism: Secondary | ICD-10-CM

## 2020-10-28 DIAGNOSIS — J3089 Other allergic rhinitis: Secondary | ICD-10-CM | POA: Diagnosis not present

## 2020-10-28 DIAGNOSIS — J4550 Severe persistent asthma, uncomplicated: Secondary | ICD-10-CM | POA: Diagnosis not present

## 2020-10-28 DIAGNOSIS — I6523 Occlusion and stenosis of bilateral carotid arteries: Secondary | ICD-10-CM

## 2020-10-28 MED ORDER — EPINEPHRINE 0.3 MG/0.3ML IJ SOAJ: INTRAMUSCULAR | 3 refills | Status: DC

## 2020-10-28 NOTE — Progress Notes (Signed)
[name]   Follow-up Note  Referring Provider: Serita Grammes, MD Primary Provider: Serita Grammes, MD Date of Office Visit: 10/28/2020  Subjective:   [name] (DOB: May 09, 1946) is a 74 y.o. female who returns to the [facility] on 10/28/2020 in re-evaluation of the following:  HPI: [name] returns to this clinic in evaluation of asthma and history of pulmonary sarcoidosis with fibrotic sequela, history of allergic rhinitis, and history of LPR.  Her last visit to this clinic with me was 29 July 2020.  During the interval she apparently contracted a "pneumonia" of her left lower lobe with "fluid" treated with Levaquin and prednisone early November 2021 with resolution of her dry cough and left-sided pleuritic pain.  Today is the best that she has felt in over 2 weeks.  Most of the pain involving her left side of her chest has abated and she has been able to clean today and cook today and overall feels pretty good.  She did visit with pulmonology last week and a repeat chest x-ray did not identify any progressive pneumonia or pleural fluid.  Once again, her reflux is under very good control.  She has received 2 Moderna Covid vaccines and the flu vaccine this year.  Allergies as of 10/28/2020      Reactions   Codeine Nausea And Vomiting   Sulfa Antibiotics Other (See Comments)   Allergic per pt's mother       Medication List    acetaminophen 325 MG tablet Commonly known as: TYLENOL Take 2 tablets (650 mg total) by mouth every 6 (six) hours as needed for mild pain (or Fever >/= 101).   albuterol 108 (90 Base) MCG/ACT inhaler Commonly known as: VENTOLIN HFA INHALE 2 PUFFS BY MOUTH 4 TIMES A DAY AS NEEDED   amLODipine 5 MG tablet Commonly known as: NORVASC Take 5 mg by mouth daily.   aspirin EC 81 MG tablet Take 81 mg by mouth daily.   atorvastatin 40 MG tablet Commonly known as: LIPITOR Take  40 mg by mouth at bedtime.   citalopram 20 MG tablet Commonly known as: CELEXA Take 20 mg by mouth daily.   dicyclomine 10 MG capsule Commonly known as: BENTYL Take 10 mg by mouth 4 (four) times daily.   EPINEPHrine 0.3 mg/0.3 mL Soaj injection Commonly known as: EPI-PEN USE AS DIRECTED FOR LIFE THREATENING ALLERGIC REACTION   famotidine 40 MG tablet Commonly known as: PEPCID TAKE ONE TABLET BY MOUTH EVERYDAY AT BEDTIME   fluticasone 50 MCG/ACT nasal spray Commonly known as: FLONASE USE ONE SPRAY IN EACH NOSTRIL ONCE OR TWICE DAILY   furosemide 20 MG tablet Commonly known as: LASIX Take 20 mg by mouth 2 (two) times daily.   ipratropium 0.06 % nasal spray Commonly known as: ATROVENT Place 1-2 sprays in each nostril every 6 hours as needed   ipratropium-albuterol 0.5-2.5 (3) MG/3ML Soln Commonly known as: DUONEB Take 3 mLs by nebulization every 6 (six) hours as needed (shortness of breath/wheezing).   levocetirizine 5 MG tablet Commonly known as: XYZAL Take 5 mg by mouth at bedtime.   losartan 100 MG tablet Commonly known as: COZAAR Take 100 mg by mouth at bedtime.   montelukast 10 MG tablet Commonly known as: SINGULAIR Take 10 mg by mouth daily.   omeprazole 40 MG capsule Commonly known as: PRILOSEC TAKE 1 CAPSULE BY MOUTH TWICE A DAY   pioglitazone 30 MG tablet Commonly known  as: ACTOS Take 30 mg by mouth daily.   PROBIOTIC DAILY PO Take 1 capsule by mouth daily.   tolterodine 4 MG 24 hr capsule Commonly known as: DETROL LA Take 4 mg by mouth at bedtime.   Trelegy Ellipta 200-62.5-25 MCG/INH Aepb Generic drug: Fluticasone-Umeclidin-Vilant Inhale 1 puff into the lungs every morning.       Past Medical History:  Diagnosis Date   Allergy    Anxiety    Arthritis    Asthma    Cataract    bilateral and removed   Depression    Diaphragmatic hernia without mention of obstruction or gangrene    DM (diabetes mellitus) ([place])    Dyspnea     Esophageal reflux    Esophagitis, unspecified    H/O adenomatous polyp of colon 2011   Heart murmur    no cardiologist, family Dr. Maryfrances Bunnell   History of kidney stones    Hyperlipidemia    Incontinence of feces    Irritable bowel syndrome    Kidney stones    OSA (obstructive sleep apnea) 09/28/2018   Sarcoidosis    Sleep apnea    no cpap now   Status post dilation of esophageal narrowing    Unspecified asthma(493.90)    Unspecified essential hypertension    Unspecified hearing loss     Past Surgical History:  Procedure Laterality Date   BLADDER SURGERY     CATARACT EXTRACTION Bilateral    HARDWARE REMOVAL Left 01/12/2017   Procedure: HARDWARE REMOVAL left wrist;  Surgeon: Leanora Cover, MD;  Location: [facility];  Service: Orthopedics;  Laterality: Left;   HEMORRHOID SURGERY     NASAL SEPTUM SURGERY     PARTIAL HYSTERECTOMY     REVERSE SHOULDER ARTHROPLASTY Left 02/22/2018   REVERSE SHOULDER ARTHROPLASTY Left 02/22/2018   Procedure: LEFT REVERSE SHOULDER ARTHROPLASTY;  Surgeon: Meredith Pel, MD;  Location: [facility];  Service: Orthopedics;  Laterality: Left;   TONSILLECTOMY AND ADENOIDECTOMY     TRANSCAROTID ARTERY REVASCULARIZATION Left 01/17/2020   Procedure: TRANSCAROTID ARTERY REVASCULARIZATION;  Surgeon: Serafina Mitchell, MD;  Location: [facility];  Service: Vascular;  Laterality: Left;   TRIGGER FINGER RELEASE Left 05/12/2016   Procedure: RELEASE TRIGGER FINGER/A-1 PULLEY INFECTION LEFT RING TENDON SHEATH INJECT RIGHT INDEX FINGER;  Surgeon: Leanora Cover, MD;  Location: [place];  Service: Orthopedics;  Laterality: Left;   TUBAL LIGATION      Review of systems negative except as noted in HPI / PMHx or noted below:  Review of Systems  Constitutional: Negative.   HENT: Negative.   Eyes: Negative.   Respiratory: Negative.   Cardiovascular: Negative.   Gastrointestinal: Negative.   Genitourinary: Negative.     Musculoskeletal: Negative.   Skin: Negative.   Neurological: Negative.   Endo/Heme/Allergies: Negative.   Psychiatric/Behavioral: Negative.      Objective:   Vitals:   10/28/20 1350  BP: (!) 132/52  Pulse: 68  Resp: 18  SpO2: 96%          Physical Exam Constitutional:      Appearance: She is not diaphoretic.     Comments: Very hard of hearing  HENT:     Head: Normocephalic.     Right Ear: External ear normal.     Left Ear: External ear normal.     Nose: Nose normal. No mucosal edema or rhinorrhea.     Mouth/Throat:     Pharynx: Uvula midline. No oropharyngeal exudate.  Eyes:  Conjunctiva/sclera: Conjunctivae normal.  Neck:     Thyroid: No thyromegaly.     Trachea: Trachea normal. No tracheal tenderness or tracheal deviation.  Cardiovascular:     Rate and Rhythm: Normal rate and regular rhythm.     Heart sounds: Normal heart sounds, S1 normal and S2 normal. No murmur heard.   Pulmonary:     Effort: No respiratory distress.     Breath sounds: Normal breath sounds. No stridor. No wheezing or rales.  Lymphadenopathy:     Head:     Right side of head: No tonsillar adenopathy.     Left side of head: No tonsillar adenopathy.     Cervical: No cervical adenopathy.  Skin:    Findings: No erythema or rash.     Nails: There is no clubbing.  Neurological:     Mental Status: She is alert.     Diagnostics:    Spirometry was performed and demonstrated an FEV1 of 1.10 at 61 % of predicted.  The patient had an Asthma Control Test with the following results:  .    Results of a chest x-ray obtained 20 October 2020 identifies the following:  Cardiac shadow is mildly enlarged but stable. Aortic calcifications are again seen. The lungs are well aerated bilaterally. No focal infiltrate or sizable effusion is noted. The scarring in the left apex is not as well appreciated on this exam as on prior CT. Postsurgical changes in the left shoulder are noted.  Results of a  chest CT scan obtained 14 October 2020 at [facility] identified fibrotic scarring and volume loss of the left pulmonary apex as well as of the bilateral lung bases.  Multiple small pulmonary nodules throughout are stable and benign.  Several nodules are calcified.  Diffuse mosaic attenuation of the airspaces.  No pleural effusion or pneumothorax.  No chest wall masses or suspicious bony lesions identified.  Assessment and Plan:   1. Asthma, severe persistent, well-controlled   2. History of sarcoidosis   3. Other allergic rhinitis   4. LPRD (laryngopharyngeal reflux disease)     1. Continue Xolair and Epi-Pen  2. Continue Trelegy 200 - 1 inhalation 1 time per day  3. Continue Flonase - one spray each nostril 1 time per day.    4. Continue omeprazole 40 twice a day    5. Continue Famotidine 40 mg in evening  6. Continue nasal saline, ProAir HFA, Duoneb if needed.  7. Can add OTC Xyzal 5 mg one tablet 1-2 times per day if needed  8. Can add OTC Mucinex DM 1-2 tablet 1-2 times per day if needed  9. Can add ipratropium 0.06% nasal spray - 1-2 sprays each nostril every 6 hours if needed  10. Return to clinic in 12 weeks or earlier if problem  11. Obtain Covid booster  At this point [name] appears to be stable regarding her airway issue while using a combination of anti-inflammatory agents for her airway including use of omalizumab and a triple inhaler and also addressing her issue with reflux.  Although her chest CT scan obtained in November 2021 does identify significant fibrotic sequela from her sarcoidosis I do not really see any pneumonia or pleural fluid on that study and I am not sure why she had left-sided pleuritic chest pain but fortunately that whole issue has resolved.  Assuming she does well with the plan noted above I will see her back in this clinic in 12 weeks or earlier if there is a  problem.  [name] Katz, MD [facility] [place]

## 2020-10-28 NOTE — Patient Instructions (Addendum)
  1. Continue Xolair and Epi-Pen  2. Continue Trelegy 200 - 1 inhalation 1 time per day  3. Continue Flonase - one spray each nostril 1 time per day.    4. Continue omeprazole 40 twice a day    5. Continue Famotidine 40 mg in evening  6. Continue nasal saline, ProAir HFA, Duoneb if needed.  7. Can add OTC Xyzal 5 mg one tablet 1-2 times per day if needed  8. Can add OTC Mucinex DM 1-2 tablet 1-2 times per day if needed  9. Can add ipratropium 0.06% nasal spray - 1-2 sprays each nostril every 6 hours if needed  10. Return to clinic in 12 weeks or earlier if problem  11. Obtain Covid booster

## 2020-10-29 ENCOUNTER — Encounter: Payer: Self-pay | Admitting: Allergy and Immunology

## 2020-11-02 DIAGNOSIS — D692 Other nonthrombocytopenic purpura: Secondary | ICD-10-CM | POA: Diagnosis not present

## 2020-11-02 DIAGNOSIS — R0689 Other abnormalities of breathing: Secondary | ICD-10-CM | POA: Diagnosis not present

## 2020-11-02 DIAGNOSIS — E119 Type 2 diabetes mellitus without complications: Secondary | ICD-10-CM | POA: Diagnosis not present

## 2020-11-02 DIAGNOSIS — J4540 Moderate persistent asthma, uncomplicated: Secondary | ICD-10-CM | POA: Diagnosis not present

## 2020-11-02 DIAGNOSIS — R0600 Dyspnea, unspecified: Secondary | ICD-10-CM | POA: Diagnosis not present

## 2020-11-13 ENCOUNTER — Encounter: Payer: Self-pay | Admitting: *Deleted

## 2020-11-17 DIAGNOSIS — Z23 Encounter for immunization: Secondary | ICD-10-CM | POA: Diagnosis not present

## 2020-11-18 DIAGNOSIS — R350 Frequency of micturition: Secondary | ICD-10-CM | POA: Diagnosis not present

## 2020-11-18 DIAGNOSIS — N3946 Mixed incontinence: Secondary | ICD-10-CM | POA: Diagnosis not present

## 2020-11-24 DIAGNOSIS — J4550 Severe persistent asthma, uncomplicated: Secondary | ICD-10-CM

## 2020-11-25 ENCOUNTER — Ambulatory Visit (INDEPENDENT_AMBULATORY_CARE_PROVIDER_SITE_OTHER): Payer: Medicare Other | Admitting: *Deleted

## 2020-11-25 ENCOUNTER — Other Ambulatory Visit: Payer: Self-pay

## 2020-11-25 ENCOUNTER — Telehealth: Payer: Self-pay | Admitting: *Deleted

## 2020-11-25 DIAGNOSIS — J4550 Severe persistent asthma, uncomplicated: Secondary | ICD-10-CM

## 2020-11-25 NOTE — Telephone Encounter (Signed)
Called patient and advised no longer buy and bill Xolair in 2022 due to Ins change. Will mail app for patient assistance for her to return back to me

## 2020-11-25 NOTE — Telephone Encounter (Signed)
-----   Message from Lavera Guise sent at 11/25/2020  9:55 AM EST ----- Regarding: NEW INSURANCE I SCANNED IN [employer] NEW INSURANCE CARD FOR NEXT YEAR.  SHE WANTS TO KNOW WHAT HER XOLAIR WILL COST WITH THE NEW INSURANCE.  CALL HER BACK WHEN YOU CAN PLEASE.

## 2020-11-26 DIAGNOSIS — E1122 Type 2 diabetes mellitus with diabetic chronic kidney disease: Secondary | ICD-10-CM | POA: Diagnosis not present

## 2020-11-26 DIAGNOSIS — E1165 Type 2 diabetes mellitus with hyperglycemia: Secondary | ICD-10-CM | POA: Diagnosis not present

## 2020-11-26 DIAGNOSIS — Z20828 Contact with and (suspected) exposure to other viral communicable diseases: Secondary | ICD-10-CM | POA: Diagnosis not present

## 2020-11-26 DIAGNOSIS — J4530 Mild persistent asthma, uncomplicated: Secondary | ICD-10-CM | POA: Diagnosis not present

## 2020-11-26 DIAGNOSIS — I129 Hypertensive chronic kidney disease with stage 1 through stage 4 chronic kidney disease, or unspecified chronic kidney disease: Secondary | ICD-10-CM | POA: Diagnosis not present

## 2020-11-26 DIAGNOSIS — J45909 Unspecified asthma, uncomplicated: Secondary | ICD-10-CM | POA: Diagnosis not present

## 2020-11-26 DIAGNOSIS — J069 Acute upper respiratory infection, unspecified: Secondary | ICD-10-CM | POA: Diagnosis not present

## 2020-11-26 DIAGNOSIS — N189 Chronic kidney disease, unspecified: Secondary | ICD-10-CM | POA: Diagnosis not present

## 2020-11-26 DIAGNOSIS — J209 Acute bronchitis, unspecified: Secondary | ICD-10-CM | POA: Diagnosis not present

## 2020-11-29 MED ORDER — TRIAMCINOLONE ACETONIDE 40 MG/ML IJ SUSP
60.0000 mg | INTRAMUSCULAR | Status: AC | PRN
  Administered 2020-09-29: 60 mg via INTRA_ARTICULAR

## 2020-11-29 MED ORDER — BUPIVACAINE HCL 0.25 % IJ SOLN
4.0000 mL | INTRAMUSCULAR | Status: AC | PRN
Start: 2020-09-29 — End: 2020-09-29
  Administered 2020-09-29: 4 mL via INTRA_ARTICULAR

## 2020-11-29 NOTE — Progress Notes (Signed)
   [name] - 75 y.o. female MRN [id number]  Date of birth: 1946/03/08  Office Visit Note: Visit Date: 09/29/2020 PCP: Buckner Malta, MD Referred by: Buckner Malta, MD  Subjective: Chief Complaint  Patient presents with  . Left Hip - Pain   HPI:  [name] is a 75 y.o. female who comes in today at the request of Dr. Burnard Bunting for planned Left anesthetic hip arthrogram with fluoroscopic guidance.  The patient has failed conservative care including home exercise, medications, time and activity modification.  This injection will be diagnostic and hopefully therapeutic.  Please see requesting physician notes for further details and justification.   ROS Otherwise per HPI.  Assessment & Plan: Visit Diagnoses:    ICD-10-CM   1. Pain in left hip  M25.552 XR C-ARM NO REPORT    Plan: No additional findings.   Meds & Orders: No orders of the defined types were placed in this encounter.   Orders Placed This Encounter  Procedures  . Large Joint Inj  . XR C-ARM NO REPORT    Follow-up: No follow-ups on file.   Procedures: Large Joint Inj: L hip joint on 09/29/2020 12:48 PM Indications: diagnostic evaluation and pain Details: 22 G 3.5 in needle, fluoroscopy-guided anterior approach  Arthrogram: No  Medications: 4 mL bupivacaine 0.25 %; 60 mg triamcinolone acetonide 40 MG/ML Outcome: tolerated well, no immediate complications  There was excellent flow of contrast producing a partial arthrogram of the hip. The patient did have relief of symptoms during the anesthetic phase of the injection. Procedure, treatment alternatives, risks and benefits explained, specific risks discussed. Consent was given by the patient. Immediately prior to procedure a time out was called to verify the correct patient, procedure, equipment, support staff and site/side marked as required. Patient was prepped and draped in the usual sterile fashion.          Clinical History: No  specialty comments available.     Objective:  VS:  HT:    WT:   BMI:     BP:   HR: bpm  TEMP: ( )  RESP:  Physical Exam   Imaging: No results found.

## 2020-12-01 DIAGNOSIS — G4733 Obstructive sleep apnea (adult) (pediatric): Secondary | ICD-10-CM | POA: Diagnosis not present

## 2020-12-21 ENCOUNTER — Ambulatory Visit: Payer: Medicare HMO | Admitting: Pulmonary Disease

## 2020-12-21 ENCOUNTER — Encounter: Payer: Self-pay | Admitting: Pulmonary Disease

## 2020-12-21 ENCOUNTER — Other Ambulatory Visit: Payer: Self-pay

## 2020-12-21 VITALS — BP 130/64 | HR 62 | Temp 98.2°F | Ht 61.0 in | Wt 163.4 lb

## 2020-12-21 DIAGNOSIS — J449 Chronic obstructive pulmonary disease, unspecified: Secondary | ICD-10-CM

## 2020-12-21 DIAGNOSIS — G4733 Obstructive sleep apnea (adult) (pediatric): Secondary | ICD-10-CM

## 2020-12-21 DIAGNOSIS — D869 Sarcoidosis, unspecified: Secondary | ICD-10-CM

## 2020-12-21 NOTE — Patient Instructions (Signed)
Follow up in 1 year.

## 2020-12-21 NOTE — Progress Notes (Signed)
[facility]  Chief Complaint  Patient presents with  . Follow-up    Having issues with inner nose irritation when wearing CPAP    Constitutional:  BP 130/64 (BP Location: Left Arm, Cuff Size: Normal)   Pulse 62   Temp 98.2 F (36.8 C) (Other (Comment)) Comment (Src): wrist  Ht 5\' 1"  (1.549 m)   Wt 163 lb 6.4 oz (74.1 kg)   SpO2 99% Comment: Room air  BMI 30.87 kg/m   Past Medical History:  Allergies, Anxiety, Depression, Hiatal hernia, DM, GERD, Colon polyp, Nephrolithiasis, HLD, IBS, Sarcoidosis, HTN  Past Surgical History:  She  has a past surgical history that includes Partial hysterectomy; Tonsillectomy and adenoidectomy; Nasal septum surgery; Tubal ligation; Hemorrhoid surgery; Bladder surgery; Cataract extraction (Bilateral); Trigger finger release (Left, 05/12/2016); Hardware Removal (Left, 01/12/2017); Reverse shoulder arthroplasty (Left, 02/22/2018); Reverse shoulder arthroplasty (Left, 02/22/2018); and Transcarotid artery revascularization (Left, 01/17/2020).  Brief Summary:  [name] is a 75 y.o. female with obstructive sleep apnea, COPD with asthma, and sarcoidosis.      Subjective:   She was seen by Geraldo Pitter in November 2021.  She had pneumonia at the time.  She had CT chest from November that showed stable changes from sarcoidosis and lung nodules.  She is now using trelegy.  These seems to help.  Uses albuterol intermittently.  Hasn't needed to use her nebulizer recently.  Has occasional cough with clear sputum.  Not having wheeze, or chest tightness.  She switched insurance recently, and having difficulty now getting [employer], clinical for [employer].  She is due for next injection later this week.  Physical Exam:   Appearance - well kempt   ENMT - no sinus tenderness, no oral exudate, no LAN, Mallampati 3 airway, no stridor, narrow nasal angles, deviated septum  Respiratory - equal breath sounds  bilaterally, no wheezing or rales  CV - s1s2 regular rate and rhythm, no murmurs  Ext - no clubbing, no edema  Skin - no rashes  Psych - normal mood and affect   Pulmonary testing:   Lymph node bx 04/27/01 >> non-caseating granuloma  Spirometry 08/06/18 >> FEV1 0.79 (42%), FEV1% 65  PFT 11/02/18 >> FEV1 1.03 (53%), FEV1% 65, TLC 3.45 (76%), DLCO 60%, + BD  Chest Imaging:   HRCT chest 09/28/18 >> atherosclerosis, 6 mm nodule RML, 10 mm nodule LUL both stable since 2016; calcified granulomas, mild patchy air trapping, moderate varicoid BTX, mild subpleural reticulation  CT chest 10/16/20 >> no significant change  Sleep Tests:   HST 09/27/18 >> AHI 32.4, SpO2 low 76%.  Auto CPAP 11/18/20 to 12/17/20 >> used on 26 of 30 nights with average 6 hrs 26 min.  Average AHI 4 with median CPAP 8 and 95 th percentile CPAP 11 cm H2O  Cardiac Tests:   Echo 07/02/19 >> EF 60 to 65%, mild/mod MR, mild AS  Social History:  She  reports that she has never smoked. She has never used smokeless tobacco. She reports that she does not drink alcohol and does not use drugs.  Family History:  Her family history includes Diabetes in her maternal grandmother and mother; Esophageal cancer in her father.     Assessment/Plan:   Obstructive sleep apnea. - she is compliant with CPAP and reports benefit - she uses [employer] for her DME - continue auto CPAP 5 to 15 cm H2O - advised her to try loosening straps on her nasal pillows mask to see  if this improves irritation under her nose  COPD with asthma. - followed by Dr. Neldon Mc with [facility] and remains on xolair injection; advised her to f/u with Dr. Bruna Potter office to determine if she has insurance approval for continued use of xolair - continue trelegy, singulair   History of pulmonary sarcoidosis with bronchiectasis. - CT chest from November 2021 showed stable findings - monitor clinically  Rheumatic valve disease, hypertension. -  followed by Dr. Geraldo Pitter with [facility]  Time Spent Involved in Patient Care on Day of Examination:  32 minutes  Follow up:  Patient Instructions  Follow up in 1 year   Medication List:   Allergies as of 12/21/2020      Reactions   Codeine Nausea And Vomiting   Sulfa Antibiotics Other (See Comments)   Allergic per pt's mother       Medication List       Accurate as of December 21, 2020 10:02 AM. If you have any questions, ask your nurse or doctor.        acetaminophen 325 MG tablet Commonly known as: TYLENOL Take 2 tablets (650 mg total) by mouth every 6 (six) hours as needed for mild pain (or Fever >/= 101).   albuterol 108 (90 Base) MCG/ACT inhaler Commonly known as: VENTOLIN HFA INHALE 2 PUFFS BY MOUTH 4 TIMES A DAY AS NEEDED   amLODipine 5 MG tablet Commonly known as: NORVASC Take 5 mg by mouth daily.   aspirin EC 81 MG tablet Take 81 mg by mouth daily.   atorvastatin 40 MG tablet Commonly known as: LIPITOR Take 40 mg by mouth at bedtime.   citalopram 20 MG tablet Commonly known as: CELEXA Take 20 mg by mouth daily.   dicyclomine 10 MG capsule Commonly known as: BENTYL Take 10 mg by mouth 4 (four) times daily.   EPINEPHrine 0.3 mg/0.3 mL Soaj injection Commonly known as: EPI-PEN Use as directed for life-threatening allergic reaction.   famotidine 40 MG tablet Commonly known as: PEPCID TAKE ONE TABLET BY MOUTH EVERYDAY AT BEDTIME   fluticasone 50 MCG/ACT nasal spray Commonly known as: FLONASE USE ONE SPRAY IN EACH NOSTRIL ONCE OR TWICE DAILY   furosemide 20 MG tablet Commonly known as: LASIX Take 20 mg by mouth 2 (two) times daily.   ipratropium 0.06 % nasal spray Commonly known as: ATROVENT Place 1-2 sprays in each nostril every 6 hours as needed   ipratropium-albuterol 0.5-2.5 (3) MG/3ML Soln Commonly known as: DUONEB Take 3 mLs by nebulization every 6 (six) hours as needed (shortness of breath/wheezing).   levocetirizine 5 MG  tablet Commonly known as: XYZAL Take 5 mg by mouth at bedtime.   losartan 100 MG tablet Commonly known as: COZAAR Take 100 mg by mouth at bedtime.   montelukast 10 MG tablet Commonly known as: SINGULAIR Take 10 mg by mouth daily.   omeprazole 40 MG capsule Commonly known as: PRILOSEC TAKE 1 CAPSULE BY MOUTH TWICE A DAY   pioglitazone 30 MG tablet Commonly known as: ACTOS Take 30 mg by mouth daily.   PROBIOTIC DAILY PO Take 1 capsule by mouth daily.   tolterodine 4 MG 24 hr capsule Commonly known as: DETROL LA Take 4 mg by mouth at bedtime.   Trelegy Ellipta 200-62.5-25 MCG/INH Aepb Generic drug: Fluticasone-Umeclidin-Vilant Inhale 1 puff into the lungs every morning.       Signature:  Chesley Mires, MD [employer] Pager - [phone] 12/21/2020, 10:02 AM

## 2020-12-23 ENCOUNTER — Ambulatory Visit: Payer: Medicare Other

## 2020-12-27 DIAGNOSIS — N189 Chronic kidney disease, unspecified: Secondary | ICD-10-CM | POA: Diagnosis not present

## 2020-12-27 DIAGNOSIS — E1122 Type 2 diabetes mellitus with diabetic chronic kidney disease: Secondary | ICD-10-CM | POA: Diagnosis not present

## 2020-12-27 DIAGNOSIS — J4530 Mild persistent asthma, uncomplicated: Secondary | ICD-10-CM | POA: Diagnosis not present

## 2020-12-27 DIAGNOSIS — I129 Hypertensive chronic kidney disease with stage 1 through stage 4 chronic kidney disease, or unspecified chronic kidney disease: Secondary | ICD-10-CM | POA: Diagnosis not present

## 2021-01-01 DIAGNOSIS — G4733 Obstructive sleep apnea (adult) (pediatric): Secondary | ICD-10-CM | POA: Diagnosis not present

## 2021-01-04 ENCOUNTER — Ambulatory Visit: Payer: Medicare Other | Admitting: Podiatry

## 2021-01-11 ENCOUNTER — Ambulatory Visit: Payer: Medicare HMO | Admitting: Podiatry

## 2021-01-11 ENCOUNTER — Other Ambulatory Visit: Payer: Self-pay

## 2021-01-11 ENCOUNTER — Encounter: Payer: Self-pay | Admitting: Podiatry

## 2021-01-11 DIAGNOSIS — B351 Tinea unguium: Secondary | ICD-10-CM

## 2021-01-11 DIAGNOSIS — E1169 Type 2 diabetes mellitus with other specified complication: Secondary | ICD-10-CM

## 2021-01-11 DIAGNOSIS — E1151 Type 2 diabetes mellitus with diabetic peripheral angiopathy without gangrene: Secondary | ICD-10-CM | POA: Diagnosis not present

## 2021-01-11 NOTE — Progress Notes (Signed)
  Subjective:  Patient ID: [name], female    DOB: [birth year],  MRN: [id number]  Chief Complaint  Patient presents with  . Nail Problem    Trim nails    75 y.o. female presents for routine foot care. Denies new issues.   Objective:  Physical Exam: warm, good capillary refill, nail exam onychomycosis of the toenails, no trophic changes or ulcerative lesions, normal DP and reduced PT pulses, and normal sensory exam. Left Foot: normal exam, no swelling, tenderness, instability; ligaments intact, full range of motion of all ankle/foot joints  Right Foot: normal exam, no swelling, tenderness, instability; ligaments intact, full range of motion of all ankle/foot joints   No images are attached to the encounter.  Assessment:   1. Onychomycosis of multiple toenails with type 2 diabetes mellitus and peripheral angiopathy ([place])    Plan:  Patient was evaluated and treated and all questions answered.  Onychomycosis, Diabetes and PAD -Patient is diabetic with a qualifying condition for at risk foot care.   Procedure: Nail Debridement Type of Debridement: manual, sharp debridement. Instrumentation: Nail nipper, rotary burr. Number of Nails: 10        No follow-ups on file.

## 2021-01-19 ENCOUNTER — Ambulatory Visit: Payer: Medicare Other | Admitting: Cardiology

## 2021-01-19 ENCOUNTER — Ambulatory Visit: Payer: Medicare HMO | Admitting: Cardiology

## 2021-01-19 DIAGNOSIS — K219 Gastro-esophageal reflux disease without esophagitis: Secondary | ICD-10-CM | POA: Diagnosis not present

## 2021-01-19 DIAGNOSIS — J4550 Severe persistent asthma, uncomplicated: Secondary | ICD-10-CM | POA: Diagnosis not present

## 2021-01-19 DIAGNOSIS — J3089 Other allergic rhinitis: Secondary | ICD-10-CM | POA: Diagnosis not present

## 2021-01-19 DIAGNOSIS — Z862 Personal history of diseases of the blood and blood-forming organs and certain disorders involving the immune mechanism: Secondary | ICD-10-CM | POA: Diagnosis not present

## 2021-01-20 ENCOUNTER — Other Ambulatory Visit: Payer: Self-pay

## 2021-01-20 ENCOUNTER — Ambulatory Visit (INDEPENDENT_AMBULATORY_CARE_PROVIDER_SITE_OTHER): Payer: Medicare HMO | Admitting: Allergy and Immunology

## 2021-01-20 VITALS — BP 140/70 | HR 66 | Resp 18

## 2021-01-20 DIAGNOSIS — Z862 Personal history of diseases of the blood and blood-forming organs and certain disorders involving the immune mechanism: Secondary | ICD-10-CM | POA: Diagnosis not present

## 2021-01-20 DIAGNOSIS — J3089 Other allergic rhinitis: Secondary | ICD-10-CM

## 2021-01-20 DIAGNOSIS — K219 Gastro-esophageal reflux disease without esophagitis: Secondary | ICD-10-CM | POA: Diagnosis not present

## 2021-01-20 DIAGNOSIS — J4550 Severe persistent asthma, uncomplicated: Secondary | ICD-10-CM | POA: Diagnosis not present

## 2021-01-20 NOTE — Patient Instructions (Signed)
  1. Continue Trelegy 200 - 1 inhalation 1 time per day  2. Continue Flonase - one spray each nostril 1 time per day.    3. Continue omeprazole 40 twice a day    4. Continue Famotidine 40 mg in evening  5. If needed:   A. nasal saline   B. ProAir HFA or Duoneb   C. OTC Xyzal 5 mg one tablet 1-2 times per day   D. OTC Mucinex DM 1-2 tablet 1-2 times per day   E. ipratropium 0.06% nasal spray - 1-2 sprays each nostril every 6 hours   6. Xolair administration?   7.  Return to clinic in 6 months or earlier if problem

## 2021-01-20 NOTE — Progress Notes (Signed)
[facility]   Follow-up Note  Referring Provider: Serita Grammes, MD Primary Provider: Serita Grammes, MD Date of Office Visit: 01/20/2021  Subjective:   [name] (DOB: [birth year]) is a 75 y.o. female who returns to the [facility] on 01/20/2021 in re-evaluation of the following:  HPI: [name] presents to this clinic in evaluation of asthma and pulmonary sarcoidosis with fibrotic sequela, history of allergic rhinitis, history of LPR.  Her last visit to this clinic was 28 October 2020.  Overall [name] feels as though she has done very well since her last visit.  She has had very little problems with coughing and wheezing.  She does not use a short acting bronchodilator.  She has not required a systemic steroid to treat an exacerbation.  She has had very little issues with her nose at this point in time and has not required an antibiotic to treat an episode of sinusitis.  She believes that her reflux is under very good control.  She is having an insurance issue with her omalizumab administration.  We are attempting to work through this issue at this point.  She has received 3 Moderna Covid vaccines and the flu vaccine.  Allergies as of 01/20/2021      Reactions   Codeine Nausea And Vomiting   Sulfa Antibiotics Other (See Comments)   Allergic per pt's mother       Medication List      acetaminophen 325 MG tablet Commonly known as: TYLENOL Take 2 tablets (650 mg total) by mouth every 6 (six) hours as needed for mild pain (or Fever >/= 101).   albuterol 108 (90 Base) MCG/ACT inhaler Commonly known as: VENTOLIN HFA INHALE 2 PUFFS BY MOUTH 4 TIMES A DAY AS NEEDED   amLODipine 5 MG tablet Commonly known as: NORVASC Take 5 mg by mouth daily.   aspirin EC 81 MG tablet Take 81 mg by mouth daily.   atorvastatin 40 MG tablet Commonly known as: LIPITOR Take 40 mg by mouth at bedtime.   citalopram 20  MG tablet Commonly known as: CELEXA Take 20 mg by mouth daily.   dicyclomine 10 MG capsule Commonly known as: BENTYL Take 10 mg by mouth 4 (four) times daily.   EPINEPHrine 0.3 mg/0.3 mL Soaj injection Commonly known as: EPI-PEN Use as directed for life-threatening allergic reaction.   famotidine 40 MG tablet Commonly known as: PEPCID TAKE ONE TABLET BY MOUTH EVERYDAY AT BEDTIME   fluticasone 50 MCG/ACT nasal spray Commonly known as: FLONASE USE ONE SPRAY IN EACH NOSTRIL ONCE OR TWICE DAILY   furosemide 20 MG tablet Commonly known as: LASIX Take 20 mg by mouth 2 (two) times daily.   ipratropium 0.06 % nasal spray Commonly known as: ATROVENT Place 1-2 sprays in each nostril every 6 hours as needed   ipratropium-albuterol 0.5-2.5 (3) MG/3ML Soln Commonly known as: DUONEB Take 3 mLs by nebulization every 6 (six) hours as needed (shortness of breath/wheezing).   levocetirizine 5 MG tablet Commonly known as: XYZAL Take 5 mg by mouth at bedtime.   losartan 100 MG tablet Commonly known as: COZAAR Take 100 mg by mouth at bedtime.   montelukast 10 MG tablet Commonly known as: SINGULAIR Take 10 mg by mouth daily.   omeprazole 40 MG capsule Commonly known as: PRILOSEC TAKE 1 CAPSULE BY MOUTH TWICE A DAY   Ozempic (0.25 or 0.5 MG/DOSE) 2 MG/1.5ML Sopn Generic drug: Semaglutide(0.25 or 0.5MG /DOS)  Inject into the skin every 7 (seven) days.   pioglitazone 30 MG tablet Commonly known as: ACTOS Take 30 mg by mouth daily.   PROBIOTIC DAILY PO Take 1 capsule by mouth daily.   tolterodine 4 MG 24 hr capsule Commonly known as: DETROL LA Take 4 mg by mouth at bedtime.   Trelegy Ellipta 200-62.5-25 MCG/INH Aepb Generic drug: Fluticasone-Umeclidin-Vilant Inhale 1 puff into the lungs every morning.       Past Medical History:  Diagnosis Date  . Allergy   . Anxiety   . Arthritis   . Asthma   . Cataract    bilateral and removed  . Depression   . Diaphragmatic  hernia without mention of obstruction or gangrene   . DM (diabetes mellitus) ([place])   . Dyspnea   . Esophageal reflux   . Esophagitis, unspecified   . H/O adenomatous polyp of colon 2011  . Heart murmur    no cardiologist, family Dr. Maryfrances Bunnell  . History of kidney stones   . Hyperlipidemia   . Incontinence of feces   . Irritable bowel syndrome   . Kidney stones   . OSA (obstructive sleep apnea) 09/28/2018  . Sarcoidosis   . Sleep apnea    no cpap now  . Status post dilation of esophageal narrowing   . Unspecified asthma(493.90)   . Unspecified essential hypertension   . Unspecified hearing loss     Past Surgical History:  Procedure Laterality Date  . BLADDER SURGERY    . CATARACT EXTRACTION Bilateral   . HARDWARE REMOVAL Left 01/12/2017   Procedure: HARDWARE REMOVAL left wrist;  Surgeon: Leanora Cover, MD;  Location: [facility];  Service: Orthopedics;  Laterality: Left;  . HEMORRHOID SURGERY    . NASAL SEPTUM SURGERY    . PARTIAL HYSTERECTOMY    . REVERSE SHOULDER ARTHROPLASTY Left 02/22/2018  . REVERSE SHOULDER ARTHROPLASTY Left 02/22/2018   Procedure: LEFT REVERSE SHOULDER ARTHROPLASTY;  Surgeon: Meredith Pel, MD;  Location: [facility];  Service: Orthopedics;  Laterality: Left;  . TONSILLECTOMY AND ADENOIDECTOMY    . TRANSCAROTID ARTERY REVASCULARIZATION Left 01/17/2020   Procedure: TRANSCAROTID ARTERY REVASCULARIZATION;  Surgeon: Serafina Mitchell, MD;  Location[facility];  Service: Vascular;  Laterality: Left;  . TRIGGER FINGER RELEASE Left 05/12/2016   Procedure: RELEASE TRIGGER FINGER/A-1 PULLEY INFECTION LEFT RING TENDON SHEATH INJECT RIGHT INDEX FINGER;  Surgeon: Leanora Cover, MD;  Location: [facility];  Service: Orthopedics;  Laterality: Left;  . TUBAL LIGATION      Review of systems negative except as noted in HPI / PMHx or noted below:  Review of Systems  Constitutional: Negative.   HENT: Negative.   Eyes: Negative.   Respiratory:  Negative.   Cardiovascular: Negative.   Gastrointestinal: Negative.   Genitourinary: Negative.   Musculoskeletal: Negative.   Skin: Negative.   Neurological: Negative.   Endo/Heme/Allergies: Negative.   Psychiatric/Behavioral: Negative.      Objective:   Vitals:   01/20/21 1028  BP: 140/70  Pulse: 66  Resp: 18  SpO2: 97%          Physical Exam Constitutional:      Appearance: She is not diaphoretic.  HENT:     Head: Normocephalic.     Right Ear: Tympanic membrane, ear canal and external ear normal.     Left Ear: Tympanic membrane, ear canal and external ear normal.     Nose: Nose normal. No mucosal edema or rhinorrhea.  Mouth/Throat:     Mouth: Oropharynx is clear and moist and mucous membranes are normal.     Pharynx: Uvula midline. No oropharyngeal exudate.  Eyes:     Conjunctiva/sclera: Conjunctivae normal.  Neck:     Thyroid: No thyromegaly.     Trachea: Trachea normal. No tracheal tenderness or tracheal deviation.  Cardiovascular:     Rate and Rhythm: Normal rate and regular rhythm.     Heart sounds: Normal heart sounds, S1 normal and S2 normal. No murmur heard.   Pulmonary:     Effort: No respiratory distress.     Breath sounds: Normal breath sounds. No stridor. No wheezing or rales.  Musculoskeletal:        General: No edema.  Lymphadenopathy:     Head:     Right side of head: No tonsillar adenopathy.     Left side of head: No tonsillar adenopathy.     Cervical: No cervical adenopathy.  Skin:    Findings: No erythema or rash.     Nails: There is no clubbing.  Neurological:     Mental Status: She is alert.     Diagnostics:    Spirometry was performed and demonstrated an FEV1 of 1.14 at 61 % of predicted.    Assessment and Plan:   1. Asthma, severe persistent, well-controlled   2. History of sarcoidosis   3. Other allergic rhinitis   4. LPRD (laryngopharyngeal reflux disease)     1. Continue Trelegy 200 - 1 inhalation 1 time per  day  2. Continue Flonase - one spray each nostril 1 time per day.    3. Continue omeprazole 40 twice a day    4. Continue Famotidine 40 mg in evening  5. If needed:   A. nasal saline   B. ProAir HFA or Duoneb   C. OTC Xyzal 5 mg one tablet 1-2 times per day   D. OTC Mucinex DM 1-2 tablet 1-2 times per day   E. ipratropium 0.06% nasal spray - 1-2 sprays each nostril every 6 hours   6. Xolair administration?   7.  Return to clinic in 6 months or earlier if problem  [name] appears to be doing very well at this point in time.  This is one of her better visits to this clinic.  We are going to keep her on a collection of anti-inflammatory agents for her airway and therapy directed against reflux.  We will see if we can continue to get approval for Xolair administration.  If not we will remove this agent and see what happens as she moves forward.  If she exacerbates in the face of not using omalizumab then we will reapply to insurance company for reinitiation of a biologic agent.  If she does well I will see her back in his clinic in 6 months or earlier if there is a problem.  Allena Katz, MD Allergy / Immunology [place]

## 2021-01-21 ENCOUNTER — Encounter: Payer: Self-pay | Admitting: Allergy and Immunology

## 2021-01-25 DIAGNOSIS — N189 Chronic kidney disease, unspecified: Secondary | ICD-10-CM | POA: Diagnosis not present

## 2021-01-25 DIAGNOSIS — J4530 Mild persistent asthma, uncomplicated: Secondary | ICD-10-CM | POA: Diagnosis not present

## 2021-01-25 DIAGNOSIS — I129 Hypertensive chronic kidney disease with stage 1 through stage 4 chronic kidney disease, or unspecified chronic kidney disease: Secondary | ICD-10-CM | POA: Diagnosis not present

## 2021-01-25 DIAGNOSIS — E1122 Type 2 diabetes mellitus with diabetic chronic kidney disease: Secondary | ICD-10-CM | POA: Diagnosis not present

## 2021-01-29 DIAGNOSIS — G4733 Obstructive sleep apnea (adult) (pediatric): Secondary | ICD-10-CM | POA: Diagnosis not present

## 2021-02-01 DIAGNOSIS — J301 Allergic rhinitis due to pollen: Secondary | ICD-10-CM | POA: Diagnosis not present

## 2021-02-01 DIAGNOSIS — D692 Other nonthrombocytopenic purpura: Secondary | ICD-10-CM | POA: Diagnosis not present

## 2021-02-01 DIAGNOSIS — E1122 Type 2 diabetes mellitus with diabetic chronic kidney disease: Secondary | ICD-10-CM | POA: Diagnosis not present

## 2021-02-01 DIAGNOSIS — D860 Sarcoidosis of lung: Secondary | ICD-10-CM | POA: Diagnosis not present

## 2021-02-01 DIAGNOSIS — I10 Essential (primary) hypertension: Secondary | ICD-10-CM | POA: Diagnosis not present

## 2021-02-01 DIAGNOSIS — Z9181 History of falling: Secondary | ICD-10-CM | POA: Diagnosis not present

## 2021-02-01 DIAGNOSIS — Z0000 Encounter for general adult medical examination without abnormal findings: Secondary | ICD-10-CM | POA: Diagnosis not present

## 2021-02-01 DIAGNOSIS — Z6830 Body mass index (BMI) 30.0-30.9, adult: Secondary | ICD-10-CM | POA: Diagnosis not present

## 2021-02-01 DIAGNOSIS — Z79899 Other long term (current) drug therapy: Secondary | ICD-10-CM | POA: Diagnosis not present

## 2021-02-15 ENCOUNTER — Telehealth: Payer: Self-pay | Admitting: *Deleted

## 2021-02-15 NOTE — Telephone Encounter (Signed)
Patient called and L/M for me to contact her regarding her Xolair.  I L/M and advised her I finally after numerous calls and proof of coinsurance patient cannot afford have got her approved for free drug and will be getting same going forward. She has appt this week and will try to use sample if we have same

## 2021-02-17 ENCOUNTER — Other Ambulatory Visit: Payer: Self-pay

## 2021-02-17 ENCOUNTER — Ambulatory Visit (INDEPENDENT_AMBULATORY_CARE_PROVIDER_SITE_OTHER): Payer: Medicare HMO | Admitting: *Deleted

## 2021-02-17 DIAGNOSIS — M25662 Stiffness of left knee, not elsewhere classified: Secondary | ICD-10-CM | POA: Diagnosis not present

## 2021-02-17 DIAGNOSIS — M25661 Stiffness of right knee, not elsewhere classified: Secondary | ICD-10-CM | POA: Diagnosis not present

## 2021-02-17 DIAGNOSIS — Z6829 Body mass index (BMI) 29.0-29.9, adult: Secondary | ICD-10-CM | POA: Diagnosis not present

## 2021-02-17 DIAGNOSIS — D860 Sarcoidosis of lung: Secondary | ICD-10-CM | POA: Diagnosis not present

## 2021-02-17 DIAGNOSIS — J4550 Severe persistent asthma, uncomplicated: Secondary | ICD-10-CM | POA: Diagnosis not present

## 2021-02-17 DIAGNOSIS — J4540 Moderate persistent asthma, uncomplicated: Secondary | ICD-10-CM | POA: Diagnosis not present

## 2021-02-23 DIAGNOSIS — Z1231 Encounter for screening mammogram for malignant neoplasm of breast: Secondary | ICD-10-CM | POA: Diagnosis not present

## 2021-02-24 DIAGNOSIS — E1122 Type 2 diabetes mellitus with diabetic chronic kidney disease: Secondary | ICD-10-CM | POA: Diagnosis not present

## 2021-02-24 DIAGNOSIS — I129 Hypertensive chronic kidney disease with stage 1 through stage 4 chronic kidney disease, or unspecified chronic kidney disease: Secondary | ICD-10-CM | POA: Diagnosis not present

## 2021-02-24 DIAGNOSIS — J4530 Mild persistent asthma, uncomplicated: Secondary | ICD-10-CM | POA: Diagnosis not present

## 2021-02-24 DIAGNOSIS — N189 Chronic kidney disease, unspecified: Secondary | ICD-10-CM | POA: Diagnosis not present

## 2021-02-26 ENCOUNTER — Other Ambulatory Visit: Payer: Self-pay

## 2021-02-26 DIAGNOSIS — Z87442 Personal history of urinary calculi: Secondary | ICD-10-CM | POA: Insufficient documentation

## 2021-02-26 DIAGNOSIS — Z8719 Personal history of other diseases of the digestive system: Secondary | ICD-10-CM | POA: Insufficient documentation

## 2021-02-26 DIAGNOSIS — M1990 Unspecified osteoarthritis, unspecified site: Secondary | ICD-10-CM | POA: Insufficient documentation

## 2021-02-26 DIAGNOSIS — F32A Depression, unspecified: Secondary | ICD-10-CM | POA: Insufficient documentation

## 2021-02-26 DIAGNOSIS — G4730 Sleep apnea, unspecified: Secondary | ICD-10-CM | POA: Insufficient documentation

## 2021-02-26 DIAGNOSIS — F419 Anxiety disorder, unspecified: Secondary | ICD-10-CM | POA: Insufficient documentation

## 2021-02-26 DIAGNOSIS — Z9889 Other specified postprocedural states: Secondary | ICD-10-CM | POA: Insufficient documentation

## 2021-02-26 DIAGNOSIS — R0600 Dyspnea, unspecified: Secondary | ICD-10-CM | POA: Insufficient documentation

## 2021-02-26 DIAGNOSIS — R011 Cardiac murmur, unspecified: Secondary | ICD-10-CM | POA: Insufficient documentation

## 2021-02-26 DIAGNOSIS — E785 Hyperlipidemia, unspecified: Secondary | ICD-10-CM | POA: Insufficient documentation

## 2021-02-26 DIAGNOSIS — E119 Type 2 diabetes mellitus without complications: Secondary | ICD-10-CM | POA: Insufficient documentation

## 2021-02-26 DIAGNOSIS — H269 Unspecified cataract: Secondary | ICD-10-CM | POA: Insufficient documentation

## 2021-02-26 DIAGNOSIS — T7840XA Allergy, unspecified, initial encounter: Secondary | ICD-10-CM | POA: Insufficient documentation

## 2021-02-26 DIAGNOSIS — N200 Calculus of kidney: Secondary | ICD-10-CM | POA: Insufficient documentation

## 2021-02-27 ENCOUNTER — Other Ambulatory Visit: Payer: Self-pay

## 2021-02-27 DIAGNOSIS — M79601 Pain in right arm: Secondary | ICD-10-CM

## 2021-03-01 ENCOUNTER — Other Ambulatory Visit: Payer: Self-pay

## 2021-03-01 ENCOUNTER — Ambulatory Visit: Payer: Medicare HMO | Admitting: Cardiology

## 2021-03-01 ENCOUNTER — Encounter: Payer: Self-pay | Admitting: Cardiology

## 2021-03-01 VITALS — BP 162/62 | HR 64 | Ht 60.0 in | Wt 164.4 lb

## 2021-03-01 DIAGNOSIS — I340 Nonrheumatic mitral (valve) insufficiency: Secondary | ICD-10-CM | POA: Diagnosis not present

## 2021-03-01 DIAGNOSIS — I10 Essential (primary) hypertension: Secondary | ICD-10-CM | POA: Diagnosis not present

## 2021-03-01 DIAGNOSIS — I350 Nonrheumatic aortic (valve) stenosis: Secondary | ICD-10-CM

## 2021-03-01 DIAGNOSIS — E782 Mixed hyperlipidemia: Secondary | ICD-10-CM

## 2021-03-01 NOTE — Patient Instructions (Signed)

## 2021-03-01 NOTE — Progress Notes (Signed)
Cardiology Office Note:    Date:  03/01/2021   ID:  [name], DOB 1946-02-09, MRN [id number]  PCP:  [name] Grammes, MD  Cardiologist:  [name] Lindau, MD   Referring MD: [name] Grammes, MD    ASSESSMENT:    1. Mild aortic stenosis   2. Essential hypertension   3. Mitral valve insufficiency, unspecified etiology   4. Mixed hyperlipidemia    PLAN:    In order of problems listed above:  1. Secondary prevention stressed with the patient.  Importance of compliance with diet medication stressed and she vocalized understanding. 2. Carotid atherosclerosis post stenting: Stable at this time and followed by her vascular doctors. 3. Mild aortic stenosis: Stable and asymptomatic and will check echo the next visit in 6 months.  Previous echo was reviewed. 4. Essential hypertension: Blood pressure stable and diet was emphasized. 5. Mixed dyslipidemia: Lipids reviewed.  Lipids followed by primary care.  Not all records were available to me but the records available are fine as far as allergy is concerned I discussed this with her at length and questions were answered to her satisfaction. 6. Patient will be seen in follow-up appointment in 6 months or earlier if the patient has any concerns    Medication Adjustments/Labs and Tests Ordered: Current medicines are reviewed at length with the patient today.  Concerns regarding medicines are outlined above.  No orders of the defined types were placed in this encounter.  No orders of the defined types were placed in this encounter.    No chief complaint on file.    History of Present Illness:    [name] is a 75 y.o. female.  Patient has past medical history of atherosclerotic vascular disease, essential hypertension, mixed dyslipidemia and mild aortic stenosis.  She denies any problems at this time and takes care of activities of daily living.  No chest pain orthopnea or PND.   at the time of my evaluation, the patient  is alert awake oriented and in no distress.  Past Medical History:  Diagnosis Date  . ABDOMINAL PAIN RIGHT LOWER QUADRANT 03/03/2010   Qualifier: Diagnosis of  By: Olevia Perches MD, Lowella Bandy   . Acquired trigger finger 04/20/2016  . Age-related osteoporosis without current pathological fracture 01/23/2017  . Allergy   . Anxiety   . Arthritis   . Asthma   . Bilateral carotid artery stenosis 01/16/2020  . Bilateral primary osteoarthritis of knee 10/16/2018  . Cataract    bilateral and removed  . Chest pain 11/01/2013  . Closed fracture of distal end of radius 06/05/2016  . DDD (degenerative disc disease), lumbar 01/23/2017  . Depression   . Diabetes mellitus type II, non insulin dependent ([place]) 07/05/2019  . Diaphragmatic hernia without mention of obstruction or gangrene   . DM 03/03/2010   Qualifier: Diagnosis of  By: Tamala Julian CMA, Claiborne Billings    . DM (diabetes mellitus) ([place])   . DOE (dyspnea on exertion) 05/06/2019  . Dyslipidemia 01/23/2017  . Dyspnea   . Esophageal reflux   . Esophagitis, unspecified   . Essential hypertension 05/02/2008   Qualifier: Diagnosis of  By: Nelson-Smith CMA (AAMA), Dottie    . Full incontinence of feces 03/03/2010   Qualifier: Diagnosis of  By: Olevia Perches MD, Lowella Bandy   . H/O adenomatous polyp of colon 2011  . H/O allergic rhinitis 07/29/2015  . Hearing loss, mixed, bilateral 12/17/2018  . Heart murmur    no cardiologist, family Dr. Maryfrances [name]  . History  of asthma 01/23/2017  . History of depression 01/23/2017  . History of diabetes mellitus 01/23/2017  . History of gastroesophageal reflux (GERD) 01/17/2017  . History of kidney stones   . History of transient ischemic attack (TIA) 01/16/2020  . Hyperlipidemia   . Incontinence of feces   . Irritable bowel syndrome   . Kidney stones   . LPRD (laryngopharyngeal reflux disease) 07/29/2015  . Mild aortic stenosis 07/05/2019  . Mitral regurgitation 07/05/2019  . Murmur 11/01/2013  . Normocytic anemia 01/16/2020  . Obstructive lung disease ([place])  07/29/2015  . OSA (obstructive sleep apnea) 09/28/2018  . Pain in left wrist 06/06/2016  . Pedal edema 05/06/2019  . Primary osteoarthritis of both feet 01/17/2017  . Primary osteoarthritis of both hands 12/13/2016  . Primary osteoarthritis of both knees 01/17/2017  . Primary osteoarthritis, left shoulder 12/11/2017  . Retained orthopedic hardware 11/14/2016  . Rheumatoid factor positive 12/13/2016  . Sarcoidosis   . Severe persistent asthma 11/16/2007   Annotation: chronic Qualifier: Diagnosis of  By: Melvyn Novas MD, Christena Deem   . Shoulder arthritis 02/22/2018  . Sleep apnea    no cpap now  . Status post dilation of esophageal narrowing   . Stiffness of left wrist joint 06/06/2016  . Unspecified hearing loss   . Vitamin D deficiency 01/17/2017    Past Surgical History:  Procedure Laterality Date  . BLADDER SURGERY    . CATARACT EXTRACTION Bilateral   . HARDWARE REMOVAL Left 01/12/2017   Procedure: HARDWARE REMOVAL left wrist;  Surgeon: Leanora Cover, MD;  Location: [facility];  Service: Orthopedics;  Laterality: Left;  . HEMORRHOID SURGERY    . NASAL SEPTUM SURGERY    . PARTIAL HYSTERECTOMY    . REVERSE SHOULDER ARTHROPLASTY Left 02/22/2018  . REVERSE SHOULDER ARTHROPLASTY Left 02/22/2018   Procedure: LEFT REVERSE SHOULDER ARTHROPLASTY;  Surgeon: Meredith Pel, MD;  Location: [facility];  Service: Orthopedics;  Laterality: Left;  . TONSILLECTOMY AND ADENOIDECTOMY    . TRANSCAROTID ARTERY REVASCULARIZATION Left 01/17/2020   Procedure: TRANSCAROTID ARTERY REVASCULARIZATION;  Surgeon: Serafina Mitchell, MD;  Location: [facility] OR;  Service: Vascular;  Laterality: Left;  . TRIGGER FINGER RELEASE Left 05/12/2016   Procedure: RELEASE TRIGGER FINGER/A-1 PULLEY INFECTION LEFT RING TENDON SHEATH INJECT RIGHT INDEX FINGER;  Surgeon: Leanora Cover, MD;  Location: [facility];  Service: Orthopedics;  Laterality: Left;  . TUBAL LIGATION      Current Medications: Current Meds  Medication  Sig  . acetaminophen (TYLENOL) 325 MG tablet Take 2 tablets (650 mg total) by mouth every 6 (six) hours as needed for mild pain (or Fever >/= 101).  Marland Kitchen albuterol (VENTOLIN HFA) 108 (90 Base) MCG/ACT inhaler Inhale 2 puffs into the lungs every 6 (six) hours as needed for wheezing or shortness of breath.  Marland Kitchen amLODipine (NORVASC) 5 MG tablet Take 5 mg by mouth daily.  Marland Kitchen aspirin EC 81 MG tablet Take 81 mg by mouth daily.  Marland Kitchen atorvastatin (LIPITOR) 40 MG tablet Take 40 mg by mouth at bedtime.  Marland Kitchen azelastine (ASTELIN) 0.1 % nasal spray Place 2 sprays into both nostrils 2 (two) times daily.  . citalopram (CELEXA) 20 MG tablet Take 20 mg by mouth daily.  Marland Kitchen dicyclomine (BENTYL) 10 MG capsule Take 10 mg by mouth 4 (four) times daily.  Marland Kitchen EPINEPHrine 0.3 mg/0.3 mL IJ SOAJ injection Use as directed for life-threatening allergic reaction.  . famotidine (PEPCID) 40 MG tablet Take 40 mg by mouth daily.  Marland Kitchen  fluticasone (FLONASE) 50 MCG/ACT nasal spray Place 1-2 sprays into both nostrils as needed for allergies or rhinitis.  . Fluticasone-Umeclidin-Vilant (TRELEGY ELLIPTA) 200-62.5-25 MCG/INH AEPB Inhale 1 puff into the lungs every morning.  . furosemide (LASIX) 20 MG tablet Take 20 mg by mouth 2 (two) times daily.   Marland Kitchen ipratropium (ATROVENT) 0.06 % nasal spray Place 1-2 sprays into both nostrils 4 (four) times daily.  Marland Kitchen ipratropium-albuterol (DUONEB) 0.5-2.5 (3) MG/3ML SOLN Take 3 mLs by nebulization every 6 (six) hours as needed (shortness of breath/wheezing).  Marland Kitchen levocetirizine (XYZAL) 5 MG tablet Take 5 mg by mouth at bedtime.   Marland Kitchen losartan (COZAAR) 100 MG tablet Take 100 mg by mouth at bedtime.  . montelukast (SINGULAIR) 10 MG tablet Take 10 mg by mouth daily.  Marland Kitchen omeprazole (PRILOSEC) 40 MG capsule Take 40 mg by mouth 2 (two) times daily.  . pioglitazone (ACTOS) 30 MG tablet Take 30 mg by mouth daily.  . Probiotic Product (PROBIOTIC DAILY PO) Take 1 capsule by mouth daily.   . Semaglutide,0.25 or 0.5MG /DOS,  (OZEMPIC, 0.25 OR 0.5 MG/DOSE,) 2 MG/1.5ML SOPN Inject 0.25 mg into the skin every 7 (seven) days.  Marland Kitchen tolterodine (DETROL LA) 4 MG 24 hr capsule Take 4 mg by mouth at bedtime.   Current Facility-Administered Medications for the 03/01/21 encounter (Office Visit) with Pacey Willadsen, Reita Cliche, MD  Medication  . omalizumab Arvid Right) injection 150 mg     Allergies:   Codeine and Sulfa antibiotics   Social History   Socioeconomic History  . Marital status: Married    Spouse name: Not on file  . Number of children: 2  . Years of education: Not on file  . Highest education level: Not on file  Occupational History  . Occupation: retired  Tobacco Use  . Smoking status: Never Smoker  . Smokeless tobacco: Never Used  Vaping Use  . Vaping Use: Never used  Substance and Sexual Activity  . Alcohol use: No  . Drug use: No  . Sexual activity: Not on file  Other Topics Concern  . Not on file  Social History Narrative   Daily caffeine use.    Social Determinants of Health   Financial Resource Strain: Not on file  Food Insecurity: Not on file  Transportation Needs: Not on file  Physical Activity: Not on file  Stress: Not on file  Social Connections: Not on file     Family History: The patient's family history includes Diabetes in her maternal grandmother and mother; Esophageal cancer in her father. There is no history of Colon cancer, Rectal cancer, or Stomach cancer.  ROS:   Please see the history of present illness.    All other systems reviewed and are negative.  EKGs/Labs/Other Studies Reviewed:    The following studies were reviewed today: I discussed my findings with the patient at length.  Echocardiogram done in 2020 revealed mild aortic stenosis.   Recent Labs: 03/03/2020: Hemoglobin 10.7; Platelets 289 07/21/2020: ALT 10; BUN 22; Creatinine, Ser 0.81; Potassium 4.5; Sodium 141; TSH 1.360  Recent Lipid Panel    Component Value Date/Time   CHOL 139 07/21/2020 0856   TRIG 92  07/21/2020 0856   HDL 59 07/21/2020 0856   CHOLHDL 2.4 07/21/2020 0856   LDLCALC 63 07/21/2020 0856    Physical Exam:    VS:  BP (!) 162/62   Pulse 64   Ht 5' (1.524 m)   Wt 164 lb 6.4 oz (74.6 kg)   SpO2 97%  BMI 32.11 kg/m     Wt Readings from Last 3 Encounters:  03/01/21 164 lb 6.4 oz (74.6 kg)  12/21/20 163 lb 6.4 oz (74.1 kg)  10/20/20 166 lb 9.6 oz (75.6 kg)     GEN: Patient is in no acute distress HEENT: Normal NECK: No JVD; No carotid bruits LYMPHATICS: No lymphadenopathy CARDIAC: Hear sounds regular, 2/6 systolic murmur at the apex. RESPIRATORY:  Clear to auscultation without rales, wheezing or rhonchi  ABDOMEN: Soft, non-tender, non-distended MUSCULOSKELETAL:  No edema; No deformity  SKIN: Warm and dry NEUROLOGIC:  Alert and oriented x 3 PSYCHIATRIC:  Normal affect   Signed, [name] Lindau, MD  03/01/2021 11:34 AM    [place]

## 2021-03-07 ENCOUNTER — Other Ambulatory Visit: Payer: Self-pay | Admitting: Allergy and Immunology

## 2021-03-15 ENCOUNTER — Other Ambulatory Visit (HOSPITAL_COMMUNITY): Payer: Medicare HMO

## 2021-03-15 ENCOUNTER — Ambulatory Visit: Payer: Medicare HMO | Admitting: Surgery

## 2021-03-17 ENCOUNTER — Other Ambulatory Visit: Payer: Self-pay

## 2021-03-17 ENCOUNTER — Ambulatory Visit (INDEPENDENT_AMBULATORY_CARE_PROVIDER_SITE_OTHER): Payer: Medicare HMO | Admitting: *Deleted

## 2021-03-17 DIAGNOSIS — J4550 Severe persistent asthma, uncomplicated: Secondary | ICD-10-CM | POA: Diagnosis not present

## 2021-04-05 ENCOUNTER — Ambulatory Visit (HOSPITAL_COMMUNITY)
Admission: RE | Admit: 2021-04-05 | Discharge: 2021-04-05 | Disposition: A | Discharge status: Home / Self Care | Payer: Medicare HMO | Source: Ambulatory Visit | Attending: Surgery | Admitting: Surgery

## 2021-04-05 ENCOUNTER — Other Ambulatory Visit: Payer: Self-pay

## 2021-04-05 ENCOUNTER — Encounter: Payer: Self-pay | Admitting: Surgery

## 2021-04-05 ENCOUNTER — Ambulatory Visit: Payer: Medicare HMO | Admitting: Surgery

## 2021-04-05 VITALS — BP 158/57 | HR 65 | Temp 98.2°F | Resp 20 | Ht 60.0 in | Wt 164.0 lb

## 2021-04-05 DIAGNOSIS — M79601 Pain in right arm: Secondary | ICD-10-CM | POA: Insufficient documentation

## 2021-04-05 DIAGNOSIS — I70213 Atherosclerosis of native arteries of extremities with intermittent claudication, bilateral legs: Secondary | ICD-10-CM

## 2021-04-05 NOTE — Progress Notes (Signed)
[facility] [place]  Patient name: [name] MRN: [id number] DOB: 01/28/1946 Sex: female   REASON FOR VISIT:    Follow up  [place] ILLNESS:     [name] is a 75 y.o. female who presented to the hospital on 01/13/2020 with weakness, fatigue, and slurred speech.  CT scan showed 70% bilateral carotid stenosis.  The left side appeared to be ulcerated.  The lesion was high and so she underwent left TCAR with flow reversal neuro protection on 01/17/2020.  Intraoperative findings included a 70% stenosis.  She reports no new symptoms.  She was found to have elevated subclavian velocities on her last ultrasound.  She will occasionally have her arms give out with overhead activities.  She does not have any lightheaded spells.  She denies neurologic symptoms of numbness or weakness in either extremity, slurred speech.  Her biggest complaint is issues with urinary incontinence  The patient is medically managed for type 2 diabetes.  She is on a statin for hypercholesterolemia.  She is on multiple medications for hypertension.  She is a non-smoker.   PAST MEDICAL HISTORY:   Past Medical History:  Diagnosis Date  . ABDOMINAL PAIN RIGHT LOWER QUADRANT 03/03/2010   Qualifier: Diagnosis of  By: Olevia Perches MD, Lowella Bandy   . Acquired trigger finger 04/20/2016  . Age-related osteoporosis without current pathological fracture 01/23/2017  . Allergy   . Anxiety   . Arthritis   . Asthma   . Bilateral carotid artery stenosis 01/16/2020  . Bilateral primary osteoarthritis of knee 10/16/2018  . Cataract    bilateral and removed  . Chest pain 11/01/2013  . Closed fracture of distal end of radius 06/05/2016  . DDD (degenerative disc disease), lumbar 01/23/2017  . Depression   . Diabetes mellitus type II, non insulin dependent ([name]) 07/05/2019  . Diaphragmatic hernia without mention of obstruction or gangrene   . DM 03/03/2010    Qualifier: Diagnosis of  By: Tamala Julian CMA, Claiborne Billings    . DM (diabetes mellitus) ([place])   . DOE (dyspnea on exertion) 05/06/2019  . Dyslipidemia 01/23/2017  . Dyspnea   . Esophageal reflux   . Esophagitis, unspecified   . Essential hypertension 05/02/2008   Qualifier: Diagnosis of  By: Nelson-Smith CMA (AAMA), Dottie    . Full incontinence of feces 03/03/2010   Qualifier: Diagnosis of  By: Olevia Perches MD, Lowella Bandy   . H/O adenomatous polyp of colon 2011  . H/O allergic rhinitis 07/29/2015  . Hearing loss, mixed, bilateral 12/17/2018  . Heart murmur    no cardiologist, family Dr. Maryfrances Bunnell  . History of asthma 01/23/2017  . History of depression 01/23/2017  . History of diabetes mellitus 01/23/2017  . History of gastroesophageal reflux (GERD) 01/17/2017  . History of kidney stones   . History of transient ischemic attack (TIA) 01/16/2020  . Hyperlipidemia   . Incontinence of feces   . Irritable bowel syndrome   . Kidney stones   . LPRD (laryngopharyngeal reflux disease) 07/29/2015  . Mild aortic stenosis 07/05/2019  . Mitral regurgitation 07/05/2019  . Murmur 11/01/2013  . Normocytic anemia 01/16/2020  . Obstructive lung disease ([place]) 07/29/2015  . OSA (obstructive sleep apnea) 09/28/2018  . Pain in left wrist 06/06/2016  . Pedal edema 05/06/2019  . Primary osteoarthritis of both feet 01/17/2017  . Primary osteoarthritis of both hands 12/13/2016  . Primary osteoarthritis of both knees 01/17/2017  . Primary osteoarthritis, left shoulder 12/11/2017  . Retained  orthopedic hardware 11/14/2016  . Rheumatoid factor positive 12/13/2016  . Sarcoidosis   . Severe persistent asthma 11/16/2007   Annotation: chronic Qualifier: Diagnosis of  By: Melvyn Novas MD, Christena Deem   . Shoulder arthritis 02/22/2018  . Sleep apnea    no cpap now  . Status post dilation of esophageal narrowing   . Stiffness of left wrist joint 06/06/2016  . Unspecified hearing loss   . Vitamin D deficiency 01/17/2017     FAMILY HISTORY:   Family History   Problem Relation Age of Onset  . Esophageal cancer Father   . Diabetes Mother   . Diabetes Maternal Grandmother   . Colon cancer Neg Hx   . Rectal cancer Neg Hx   . Stomach cancer Neg Hx     SOCIAL HISTORY:   Social History   Tobacco Use  . Smoking status: Never Smoker  . Smokeless tobacco: Never Used  Substance Use Topics  . Alcohol use: No     ALLERGIES:   Allergies  Allergen Reactions  . Codeine Nausea And Vomiting  . Sulfa Antibiotics Other (See Comments)    Allergic per pt's mother      CURRENT MEDICATIONS:   Current Outpatient Medications  Medication Sig Dispense Refill  . acetaminophen (TYLENOL) 325 MG tablet Take 2 tablets (650 mg total) by mouth every 6 (six) hours as needed for mild pain (or Fever >/= 101).    Marland Kitchen albuterol (VENTOLIN HFA) 108 (90 Base) MCG/ACT inhaler Inhale 2 puffs into the lungs every 6 (six) hours as needed for wheezing or shortness of breath.    Marland Kitchen amLODipine (NORVASC) 5 MG tablet Take 5 mg by mouth daily.  2  . aspirin EC 81 MG tablet Take 81 mg by mouth daily.    Marland Kitchen atorvastatin (LIPITOR) 40 MG tablet Take 40 mg by mouth at bedtime.    Marland Kitchen azelastine (ASTELIN) 0.1 % nasal spray Place 2 sprays into both nostrils 2 (two) times daily.    . citalopram (CELEXA) 20 MG tablet Take 20 mg by mouth daily.    Marland Kitchen dicyclomine (BENTYL) 10 MG capsule Take 10 mg by mouth 4 (four) times daily.    Marland Kitchen EPINEPHrine 0.3 mg/0.3 mL IJ SOAJ injection Use as directed for life-threatening allergic reaction. 1 each 3  . famotidine (PEPCID) 40 MG tablet TAKE ONE TABLET BY MOUTH EVERYDAY AT BEDTIME 30 tablet 4  . fluticasone (FLONASE) 50 MCG/ACT nasal spray Place 1-2 sprays into both nostrils as needed for allergies or rhinitis.    . Fluticasone-Umeclidin-Vilant (TRELEGY ELLIPTA) 200-62.5-25 MCG/INH AEPB Inhale 1 puff into the lungs every morning. 1 each 3  . furosemide (LASIX) 20 MG tablet Take 20 mg by mouth 2 (two) times daily.     Marland Kitchen ipratropium (ATROVENT) 0.06 % nasal  spray Place 1-2 sprays into both nostrils 4 (four) times daily.    Marland Kitchen ipratropium-albuterol (DUONEB) 0.5-2.5 (3) MG/3ML SOLN Take 3 mLs by nebulization every 6 (six) hours as needed (shortness of breath/wheezing).    Marland Kitchen levocetirizine (XYZAL) 5 MG tablet Take 5 mg by mouth at bedtime.     Marland Kitchen losartan (COZAAR) 100 MG tablet Take 100 mg by mouth at bedtime.    . montelukast (SINGULAIR) 10 MG tablet Take 10 mg by mouth daily.    Marland Kitchen omeprazole (PRILOSEC) 40 MG capsule Take 40 mg by mouth 2 (two) times daily.    . pioglitazone (ACTOS) 30 MG tablet Take 30 mg by mouth daily.    . Probiotic Product (  PROBIOTIC DAILY PO) Take 1 capsule by mouth daily.     . Semaglutide,0.25 or 0.5MG /DOS, (OZEMPIC, 0.25 OR 0.5 MG/DOSE,) 2 MG/1.5ML SOPN Inject 0.25 mg into the skin every 7 (seven) days.    Marland Kitchen tolterodine (DETROL LA) 4 MG 24 hr capsule Take 4 mg by mouth at bedtime.     Current Facility-Administered Medications  Medication Dose Route Frequency Provider Last Rate Last Admin  . omalizumab Arvid Right) injection 150 mg  150 mg Subcutaneous Q28 days Jiles Prows, MD   150 mg at 03/17/21 1403    REVIEW OF SYSTEMS:   [X]  denotes positive finding, [ ]  denotes negative finding Cardiac  Comments:  Chest pain or chest pressure:    Shortness of breath upon exertion:    Short of breath when lying flat:    Irregular heart rhythm:        Vascular    Pain in calf, thigh, or hip brought on by ambulation:    Pain in feet at night that wakes you up from your sleep:     Blood clot in your veins:    Leg swelling:         Pulmonary    Oxygen at home:    Productive cough:     Wheezing:         Neurologic    Sudden weakness in arms or legs:     Sudden numbness in arms or legs:     Sudden onset of difficulty speaking or slurred speech:    Temporary loss of vision in one eye:     Problems with dizziness:         Gastrointestinal    Blood in stool:     Vomited blood:         Genitourinary    Burning when  urinating:     Blood in urine:        Psychiatric    Major depression:         Hematologic    Bleeding problems:    Problems with blood clotting too easily:        Skin    Rashes or ulcers:        Constitutional    Fever or chills:      PHYSICAL EXAM:   Vitals:   04/05/21 1127  BP: (!) 158/57  Pulse: 65  Resp: 20  Temp: 98.2 F (36.8 C)  SpO2: 95%  Weight: 164 lb (74.4 kg)  Height: 5' (1.524 m)    GENERAL: The patient is a well-nourished female, in no acute distress. The vital signs are documented above. CARDIAC: There is a regular rate and rhythm.  VASCULAR: Palpable bilateral radial and bilateral dorsalis pedis pulse PULMONARY: Non-labored respirations MUSCULOSKELETAL: There are no major deformities or cyanosis. NEUROLOGIC: No focal weakness or paresthesias are detected. SKIN: There are no ulcers or rashes noted. PSYCHIATRIC: The patient has a normal affect.  STUDIES:   I have reviewed the following: Right subclavian artery velocity was 308. This was the only test she had today  MEDICAL ISSUES:   Carotid disease: Unfortunately she did not get a carotid duplex today.  She remains asymptomatic.  I will have her follow-up in 6 months with a carotid ultrasound  Subclavian stenosis: She has elevated bilateral subclavian artery velocities however she has palpable radial pulses.  Her symptoms are only mildly consistent with subclavian steal.  I would not recommend intervention at this time.  Urinary incontinence: She sees Dr. Matilde Sprang, she last  saw him in January and does not have an appointment for another year however I have told her to contact him in the immediate future for follow-up.    Leia Alf, MD, FACS [facility] [phone] Pager [phone]

## 2021-04-06 ENCOUNTER — Other Ambulatory Visit: Payer: Self-pay

## 2021-04-06 DIAGNOSIS — I6523 Occlusion and stenosis of bilateral carotid arteries: Secondary | ICD-10-CM

## 2021-04-12 ENCOUNTER — Ambulatory Visit: Payer: Medicare HMO | Admitting: Podiatry

## 2021-04-12 ENCOUNTER — Other Ambulatory Visit: Payer: Self-pay

## 2021-04-12 DIAGNOSIS — E1151 Type 2 diabetes mellitus with diabetic peripheral angiopathy without gangrene: Secondary | ICD-10-CM | POA: Diagnosis not present

## 2021-04-12 DIAGNOSIS — E1169 Type 2 diabetes mellitus with other specified complication: Secondary | ICD-10-CM

## 2021-04-12 DIAGNOSIS — B351 Tinea unguium: Secondary | ICD-10-CM

## 2021-04-12 NOTE — Progress Notes (Signed)
  Subjective:  Patient ID: [name], female    DOB: 13-Jul-1946,  MRN: [id number]  Chief Complaint  Patient presents with  . debride    DFC -FBS: 115 A1C: 6 PCP: [name] x 3 mo   75 y.o. female presents for routine foot care. Denies new issues.   Objective:  Physical Exam: warm, good capillary refill, nail exam onychomycosis of the toenails, no trophic changes or ulcerative lesions, normal DP and reduced PT pulses, and normal sensory exam. Left Foot: normal exam, no swelling, tenderness, instability; ligaments intact, full range of motion of all ankle/foot joints  Right Foot: normal exam, no swelling, tenderness, instability; ligaments intact, full range of motion of all ankle/foot joints   No images are attached to the encounter.  Assessment:   1. Onychomycosis of multiple toenails with type 2 diabetes mellitus and peripheral angiopathy ([place])    Plan:  Patient was evaluated and treated and all questions answered.  Onychomycosis, Diabetes and PAD -Patient is diabetic with a qualifying condition for at risk foot care.  Procedure: Nail Debridement Type of Debridement: manual, sharp debridement. Instrumentation: Nail nipper, rotary burr. Number of Nails: 10        No follow-ups on file.

## 2021-04-13 DIAGNOSIS — R350 Frequency of micturition: Secondary | ICD-10-CM | POA: Diagnosis not present

## 2021-04-13 DIAGNOSIS — N3946 Mixed incontinence: Secondary | ICD-10-CM | POA: Diagnosis not present

## 2021-04-14 ENCOUNTER — Ambulatory Visit (INDEPENDENT_AMBULATORY_CARE_PROVIDER_SITE_OTHER): Payer: Medicare HMO | Admitting: *Deleted

## 2021-04-14 ENCOUNTER — Other Ambulatory Visit: Payer: Self-pay

## 2021-04-14 DIAGNOSIS — J4551 Severe persistent asthma with (acute) exacerbation: Secondary | ICD-10-CM | POA: Diagnosis not present

## 2021-04-14 DIAGNOSIS — J4550 Severe persistent asthma, uncomplicated: Secondary | ICD-10-CM

## 2021-04-14 DIAGNOSIS — J40 Bronchitis, not specified as acute or chronic: Secondary | ICD-10-CM | POA: Diagnosis not present

## 2021-04-14 DIAGNOSIS — Z6828 Body mass index (BMI) 28.0-28.9, adult: Secondary | ICD-10-CM | POA: Diagnosis not present

## 2021-04-14 DIAGNOSIS — J301 Allergic rhinitis due to pollen: Secondary | ICD-10-CM | POA: Diagnosis not present

## 2021-04-14 DIAGNOSIS — J329 Chronic sinusitis, unspecified: Secondary | ICD-10-CM | POA: Diagnosis not present

## 2021-04-14 DIAGNOSIS — D860 Sarcoidosis of lung: Secondary | ICD-10-CM | POA: Diagnosis not present

## 2021-04-21 DIAGNOSIS — M25561 Pain in right knee: Secondary | ICD-10-CM | POA: Diagnosis not present

## 2021-04-21 DIAGNOSIS — J4540 Moderate persistent asthma, uncomplicated: Secondary | ICD-10-CM | POA: Diagnosis not present

## 2021-04-21 DIAGNOSIS — M25562 Pain in left knee: Secondary | ICD-10-CM | POA: Diagnosis not present

## 2021-04-21 DIAGNOSIS — Z6829 Body mass index (BMI) 29.0-29.9, adult: Secondary | ICD-10-CM | POA: Diagnosis not present

## 2021-04-21 DIAGNOSIS — G8929 Other chronic pain: Secondary | ICD-10-CM | POA: Diagnosis not present

## 2021-04-26 DIAGNOSIS — E1122 Type 2 diabetes mellitus with diabetic chronic kidney disease: Secondary | ICD-10-CM | POA: Diagnosis not present

## 2021-04-26 DIAGNOSIS — I129 Hypertensive chronic kidney disease with stage 1 through stage 4 chronic kidney disease, or unspecified chronic kidney disease: Secondary | ICD-10-CM | POA: Diagnosis not present

## 2021-04-26 DIAGNOSIS — N189 Chronic kidney disease, unspecified: Secondary | ICD-10-CM | POA: Diagnosis not present

## 2021-04-26 DIAGNOSIS — J4530 Mild persistent asthma, uncomplicated: Secondary | ICD-10-CM | POA: Diagnosis not present

## 2021-05-12 ENCOUNTER — Other Ambulatory Visit: Payer: Self-pay

## 2021-05-12 ENCOUNTER — Ambulatory Visit (INDEPENDENT_AMBULATORY_CARE_PROVIDER_SITE_OTHER): Payer: Medicare HMO | Admitting: *Deleted

## 2021-05-12 DIAGNOSIS — J4550 Severe persistent asthma, uncomplicated: Secondary | ICD-10-CM | POA: Diagnosis not present

## 2021-05-20 DIAGNOSIS — N3946 Mixed incontinence: Secondary | ICD-10-CM | POA: Diagnosis not present

## 2021-05-27 DIAGNOSIS — N189 Chronic kidney disease, unspecified: Secondary | ICD-10-CM | POA: Diagnosis not present

## 2021-05-27 DIAGNOSIS — E1122 Type 2 diabetes mellitus with diabetic chronic kidney disease: Secondary | ICD-10-CM | POA: Diagnosis not present

## 2021-05-27 DIAGNOSIS — I129 Hypertensive chronic kidney disease with stage 1 through stage 4 chronic kidney disease, or unspecified chronic kidney disease: Secondary | ICD-10-CM | POA: Diagnosis not present

## 2021-05-27 DIAGNOSIS — J4530 Mild persistent asthma, uncomplicated: Secondary | ICD-10-CM | POA: Diagnosis not present

## 2021-05-28 DIAGNOSIS — H9011 Conductive hearing loss, unilateral, right ear, with unrestricted hearing on the contralateral side: Secondary | ICD-10-CM | POA: Diagnosis not present

## 2021-06-07 DIAGNOSIS — H524 Presbyopia: Secondary | ICD-10-CM | POA: Diagnosis not present

## 2021-06-07 DIAGNOSIS — E119 Type 2 diabetes mellitus without complications: Secondary | ICD-10-CM | POA: Diagnosis not present

## 2021-06-07 DIAGNOSIS — H43813 Vitreous degeneration, bilateral: Secondary | ICD-10-CM | POA: Diagnosis not present

## 2021-06-07 DIAGNOSIS — H353133 Nonexudative age-related macular degeneration, bilateral, advanced atrophic without subfoveal involvement: Secondary | ICD-10-CM | POA: Diagnosis not present

## 2021-06-09 ENCOUNTER — Ambulatory Visit (INDEPENDENT_AMBULATORY_CARE_PROVIDER_SITE_OTHER): Payer: Medicare HMO | Admitting: *Deleted

## 2021-06-09 ENCOUNTER — Other Ambulatory Visit: Payer: Self-pay

## 2021-06-09 DIAGNOSIS — J4550 Severe persistent asthma, uncomplicated: Secondary | ICD-10-CM

## 2021-06-09 MED ORDER — OMALIZUMAB 150 MG/ML ~~LOC~~ SOSY
150.0000 mg | PREFILLED_SYRINGE | SUBCUTANEOUS | Status: DC
  Administered 2021-06-09 – 2022-02-16 (×10): 150 mg via SUBCUTANEOUS

## 2021-06-14 DIAGNOSIS — M5441 Lumbago with sciatica, right side: Secondary | ICD-10-CM | POA: Diagnosis not present

## 2021-06-14 DIAGNOSIS — J029 Acute pharyngitis, unspecified: Secondary | ICD-10-CM | POA: Diagnosis not present

## 2021-06-14 DIAGNOSIS — Z6829 Body mass index (BMI) 29.0-29.9, adult: Secondary | ICD-10-CM | POA: Diagnosis not present

## 2021-06-14 DIAGNOSIS — N3001 Acute cystitis with hematuria: Secondary | ICD-10-CM | POA: Diagnosis not present

## 2021-06-14 DIAGNOSIS — M542 Cervicalgia: Secondary | ICD-10-CM | POA: Diagnosis not present

## 2021-06-27 DIAGNOSIS — N189 Chronic kidney disease, unspecified: Secondary | ICD-10-CM | POA: Diagnosis not present

## 2021-06-27 DIAGNOSIS — E1122 Type 2 diabetes mellitus with diabetic chronic kidney disease: Secondary | ICD-10-CM | POA: Diagnosis not present

## 2021-06-27 DIAGNOSIS — J4530 Mild persistent asthma, uncomplicated: Secondary | ICD-10-CM | POA: Diagnosis not present

## 2021-06-27 DIAGNOSIS — I129 Hypertensive chronic kidney disease with stage 1 through stage 4 chronic kidney disease, or unspecified chronic kidney disease: Secondary | ICD-10-CM | POA: Diagnosis not present

## 2021-07-01 DIAGNOSIS — N3001 Acute cystitis with hematuria: Secondary | ICD-10-CM | POA: Diagnosis not present

## 2021-07-01 DIAGNOSIS — Z6829 Body mass index (BMI) 29.0-29.9, adult: Secondary | ICD-10-CM | POA: Diagnosis not present

## 2021-07-01 DIAGNOSIS — D692 Other nonthrombocytopenic purpura: Secondary | ICD-10-CM | POA: Diagnosis not present

## 2021-07-06 DIAGNOSIS — G4733 Obstructive sleep apnea (adult) (pediatric): Secondary | ICD-10-CM | POA: Diagnosis not present

## 2021-07-07 ENCOUNTER — Ambulatory Visit (INDEPENDENT_AMBULATORY_CARE_PROVIDER_SITE_OTHER): Payer: Medicare HMO | Admitting: *Deleted

## 2021-07-07 ENCOUNTER — Other Ambulatory Visit: Payer: Self-pay

## 2021-07-07 DIAGNOSIS — J4550 Severe persistent asthma, uncomplicated: Secondary | ICD-10-CM | POA: Diagnosis not present

## 2021-07-19 ENCOUNTER — Ambulatory Visit: Payer: Medicare HMO | Admitting: Podiatry

## 2021-07-19 ENCOUNTER — Other Ambulatory Visit: Payer: Self-pay

## 2021-07-19 DIAGNOSIS — B351 Tinea unguium: Secondary | ICD-10-CM | POA: Diagnosis not present

## 2021-07-19 DIAGNOSIS — E1151 Type 2 diabetes mellitus with diabetic peripheral angiopathy without gangrene: Secondary | ICD-10-CM

## 2021-07-19 DIAGNOSIS — E1169 Type 2 diabetes mellitus with other specified complication: Secondary | ICD-10-CM

## 2021-07-19 NOTE — Progress Notes (Signed)
  Subjective:  Patient ID: [name], female    DOB: 04-Jul-1946,  MRN: [id number]  Chief Complaint  Patient presents with   debride    DFC -FBS: 126 a1C: na PCP: [name] x 3 wks   75 y.o. female presents for routine foot care. Denies new issues.   Objective:  Physical Exam: warm, good capillary refill, nail exam onychomycosis of the toenails, no trophic changes or ulcerative lesions, normal DP and reduced PT pulses, and normal sensory exam. Left Foot: normal exam, no swelling, tenderness, instability; ligaments intact, full range of motion of all ankle/foot joints  Right Foot: normal exam, no swelling, tenderness, instability; ligaments intact, full range of motion of all ankle/foot joints   No images are attached to the encounter.  Assessment:   1. Onychomycosis of multiple toenails with type 2 diabetes mellitus and peripheral angiopathy ([place])     Plan:  Patient was evaluated and treated and all questions answered.  Onychomycosis, Diabetes and PAD -Patient is diabetic with a qualifying condition for at risk foot care.  Procedure: Nail Debridement Type of Debridement: manual, sharp debridement. Instrumentation: Nail nipper, rotary burr. Number of Nails: 10          No follow-ups on file.

## 2021-07-21 ENCOUNTER — Other Ambulatory Visit: Payer: Self-pay

## 2021-07-21 ENCOUNTER — Ambulatory Visit: Payer: Medicare HMO | Admitting: Allergy and Immunology

## 2021-07-21 ENCOUNTER — Encounter: Payer: Self-pay | Admitting: Allergy and Immunology

## 2021-07-21 VITALS — BP 146/80 | HR 64 | Resp 16

## 2021-07-21 DIAGNOSIS — K219 Gastro-esophageal reflux disease without esophagitis: Secondary | ICD-10-CM | POA: Diagnosis not present

## 2021-07-21 DIAGNOSIS — J4550 Severe persistent asthma, uncomplicated: Secondary | ICD-10-CM

## 2021-07-21 DIAGNOSIS — J3089 Other allergic rhinitis: Secondary | ICD-10-CM

## 2021-07-21 DIAGNOSIS — Z862 Personal history of diseases of the blood and blood-forming organs and certain disorders involving the immune mechanism: Secondary | ICD-10-CM

## 2021-07-21 NOTE — Patient Instructions (Addendum)
  1. Continue Xolair injections  2. Continue Trelegy 200 - 1 inhalation 1 time per day  3. Continue Flonase - one spray each nostril 1 time per day.    4. Continue omeprazole 40 twice a day + Famotidine 40 mg in evening  5. If needed:   A. nasal saline   B. ProAir HFA or Duoneb   C. OTC Xyzal 5 mg one tablet 1-2 times per day   D. OTC Mucinex DM 1-2 tablet 1-2 times per day   E. ipratropium 0.06% nasal spray - 1-2 sprays each nostril every 6 hours   6.  Obtain fall flu vaccine   7.  Return to clinic in 6 months or earlier if problem

## 2021-07-21 NOTE — Progress Notes (Signed)
[facility]   Follow-up Note  Referring Provider: Serita Grammes, MD Primary Provider: Serita Grammes, MD Date of Office Visit: 07/21/2021  Subjective:   [name] (DOB: Dec 10, 1945) is a 75 y.o. female who returns to the [facility] on 07/21/2021 in re-evaluation of the following:  HPI: [name] returns to this clinic in evaluation of asthma in the context of previous pulmonary sarcoidosis with fibrotic sequela, allergic rhinitis, and LPR.  Her last visit to this clinic was 20 January 2021.  Overall she is really doing very well with her airway and has not had any significant wheezing and coughing and does not use a short acting bronchodilator and has not required a systemic steroid or an antibiotic for any type of airway issue.  She continues to use a collection of anti-inflammatory agents for her airway including the use of omalizumab.  She does use omeprazole twice a day and famotidine for her reflux induced respiratory disease which she believes is under very good control.  She still has a little bit of a cough on occasion when she has to clear out her throat but otherwise is doing well from that standpoint.  Allergies as of 07/21/2021       Reactions   Codeine Nausea And Vomiting   Sulfa Antibiotics Other (See Comments)   Allergic per pt's mother         Medication List    acetaminophen 325 MG tablet Commonly known as: TYLENOL Take 2 tablets (650 mg total) by mouth every 6 (six) hours as needed for mild pain (or Fever >/= 101).   albuterol 108 (90 Base) MCG/ACT inhaler Commonly known as: VENTOLIN HFA Inhale 2 puffs into the lungs every 6 (six) hours as needed for wheezing or shortness of breath.   amLODipine 5 MG tablet Commonly known as: NORVASC Take 5 mg by mouth daily.   aspirin EC 81 MG tablet Take 81 mg by mouth daily.   atorvastatin 40 MG tablet Commonly known as: LIPITOR Take 40 mg  by mouth at bedtime.   azelastine 0.1 % nasal spray Commonly known as: ASTELIN Place 2 sprays into both nostrils 2 (two) times daily.   citalopram 20 MG tablet Commonly known as: CELEXA Take 20 mg by mouth daily.   dicyclomine 10 MG capsule Commonly known as: BENTYL Take 10 mg by mouth 4 (four) times daily.   EPINEPHrine 0.3 mg/0.3 mL Soaj injection Commonly known as: EPI-PEN Use as directed for life-threatening allergic reaction.   famotidine 40 MG tablet Commonly known as: PEPCID TAKE ONE TABLET BY MOUTH EVERYDAY AT BEDTIME   fluticasone 50 MCG/ACT nasal spray Commonly known as: FLONASE Place 1-2 sprays into both nostrils as needed for allergies or rhinitis.   furosemide 20 MG tablet Commonly known as: LASIX Take 20 mg by mouth 2 (two) times daily.   ipratropium 0.06 % nasal spray Commonly known as: ATROVENT Place 1-2 sprays into both nostrils 4 (four) times daily.   ipratropium-albuterol 0.5-2.5 (3) MG/3ML Soln Commonly known as: DUONEB Take 3 mLs by nebulization every 6 (six) hours as needed (shortness of breath/wheezing).   levocetirizine 5 MG tablet Commonly known as: XYZAL Take 5 mg by mouth at bedtime.   losartan 100 MG tablet Commonly known as: COZAAR Take 100 mg by mouth at bedtime.   montelukast 10 MG tablet Commonly known as: SINGULAIR Take 10 mg by mouth daily.   omeprazole 40 MG capsule Commonly known  as: PRILOSEC Take 40 mg by mouth 2 (two) times daily.   Ozempic (0.25 or 0.5 MG/DOSE) 2 MG/1.5ML Sopn Generic drug: Semaglutide(0.25 or 0.'5MG'$ /DOS) Inject 0.25 mg into the skin every 7 (seven) days.   pioglitazone 30 MG tablet Commonly known as: ACTOS Take 30 mg by mouth daily.   PROBIOTIC DAILY PO Take 1 capsule by mouth daily.   tolterodine 4 MG 24 hr capsule Commonly known as: DETROL LA Take 4 mg by mouth at bedtime.   Trelegy Ellipta 200-62.5-25 MCG/INH Aepb Generic drug: Fluticasone-Umeclidin-Vilant Inhale 1 puff into the lungs  every morning.    Past Medical History:  Diagnosis Date   ABDOMINAL PAIN RIGHT LOWER QUADRANT 03/03/2010   Qualifier: Diagnosis of  By: Olevia Perches MD, Lowella Bandy    Acquired trigger finger 04/20/2016   Age-related osteoporosis without current pathological fracture 01/23/2017   Allergy    Anxiety    Arthritis    Asthma    Bilateral carotid artery stenosis 01/16/2020   Bilateral primary osteoarthritis of knee 10/16/2018   Cataract    bilateral and removed   Chest pain 11/01/2013   Closed fracture of distal end of radius 06/05/2016   DDD (degenerative disc disease), lumbar 01/23/2017   Depression    Diabetes mellitus type II, non insulin dependent ([place]) 07/05/2019   Diaphragmatic hernia without mention of obstruction or gangrene    DM 03/03/2010   Qualifier: Diagnosis of  By: Tamala Julian CMA, Claiborne Billings     DM (diabetes mellitus) ([place])    DOE (dyspnea on exertion) 05/06/2019   Dyslipidemia 01/23/2017   Dyspnea    Esophageal reflux    Esophagitis, unspecified    Essential hypertension 05/02/2008   Qualifier: Diagnosis of  By: Nelson-Smith CMA (AAMA), Dottie     Full incontinence of feces 03/03/2010   Qualifier: Diagnosis of  By: Olevia Perches MD, Lowella Bandy    H/O adenomatous polyp of colon 2011   H/O allergic rhinitis 07/29/2015   Hearing loss, mixed, bilateral 12/17/2018   Heart murmur    no cardiologist, family Dr. Maryfrances Bunnell   History of asthma 01/23/2017   History of depression 01/23/2017   History of diabetes mellitus 01/23/2017   History of gastroesophageal reflux (GERD) 01/17/2017   History of kidney stones    History of transient ischemic attack (TIA) 01/16/2020   Hyperlipidemia    Incontinence of feces    Irritable bowel syndrome    Kidney stones    LPRD (laryngopharyngeal reflux disease) 07/29/2015   Mild aortic stenosis 07/05/2019   Mitral regurgitation 07/05/2019   Murmur 11/01/2013   Normocytic anemia 01/16/2020   Obstructive lung disease ([place]) 07/29/2015   OSA (obstructive sleep apnea) 09/28/2018   Pain in left  wrist 06/06/2016   Pedal edema 05/06/2019   Primary osteoarthritis of both feet 01/17/2017   Primary osteoarthritis of both hands 12/13/2016   Primary osteoarthritis of both knees 01/17/2017   Primary osteoarthritis, left shoulder 12/11/2017   Retained orthopedic hardware 11/14/2016   Rheumatoid factor positive 12/13/2016   Sarcoidosis    Severe persistent asthma 11/16/2007   Annotation: chronic Qualifier: Diagnosis of  By: Melvyn Novas MD, Christena Deem    Shoulder arthritis 02/22/2018   Sleep apnea    no cpap now   Status post dilation of esophageal narrowing    Stiffness of left wrist joint 06/06/2016   Unspecified hearing loss    Vitamin D deficiency 01/17/2017    Past Surgical History:  Procedure Laterality Date   BLADDER SURGERY  CATARACT EXTRACTION Bilateral    HARDWARE REMOVAL Left 01/12/2017   Procedure: HARDWARE REMOVAL left wrist;  Surgeon: Leanora Cover, MD;  Location: [facility];  Service: Orthopedics;  Laterality: Left;   HEMORRHOID SURGERY     NASAL SEPTUM SURGERY     PARTIAL HYSTERECTOMY     REVERSE SHOULDER ARTHROPLASTY Left 02/22/2018   REVERSE SHOULDER ARTHROPLASTY Left 02/22/2018   Procedure: LEFT REVERSE SHOULDER ARTHROPLASTY;  Surgeon: Meredith Pel, MD;  Location: [facility];  Service: Orthopedics;  Laterality: Left;   TONSILLECTOMY AND ADENOIDECTOMY     TRANSCAROTID ARTERY REVASCULARIZATION  Left 01/17/2020   Procedure: TRANSCAROTID ARTERY REVASCULARIZATION;  Surgeon: Serafina Mitchell, MD;  Location: [facility];  Service: Vascular;  Laterality: Left;   TRIGGER FINGER RELEASE Left 05/12/2016   Procedure: RELEASE TRIGGER FINGER/A-1 PULLEY INFECTION LEFT RING TENDON SHEATH INJECT RIGHT INDEX FINGER;  Surgeon: Leanora Cover, MD;  Location: [facility];  Service: Orthopedics;  Laterality: Left;   TUBAL LIGATION      Review of systems negative except as noted in HPI / PMHx or noted below:  Review of Systems  Constitutional: Negative.   HENT: Negative.     Eyes: Negative.   Respiratory: Negative.    Cardiovascular: Negative.   Gastrointestinal: Negative.   Genitourinary: Negative.   Musculoskeletal: Negative.   Skin: Negative.   Neurological: Negative.   Endo/Heme/Allergies: Negative.   Psychiatric/Behavioral: Negative.      Objective:   Vitals:   07/21/21 1021  BP: (!) 146/80  Pulse: 64  Resp: 16  SpO2: 97%          Physical Exam Constitutional:      Appearance: She is not diaphoretic.  HENT:     Head: Normocephalic.     Right Ear: Tympanic membrane, ear canal and external ear normal.     Left Ear: Tympanic membrane, ear canal and external ear normal.     Nose: Nose normal. No mucosal edema or rhinorrhea.     Mouth/Throat:     Pharynx: Uvula midline. No oropharyngeal exudate.  Eyes:     Conjunctiva/sclera: Conjunctivae normal.  Neck:     Thyroid: No thyromegaly.     Trachea: Trachea normal. No tracheal tenderness or tracheal deviation.  Cardiovascular:     Rate and Rhythm: Normal rate and regular rhythm.     Heart sounds: S1 normal and S2 normal. Murmur (Systolic) heard.  Pulmonary:     Effort: No respiratory distress.     Breath sounds: Normal breath sounds. No stridor. No wheezing or rales.  Lymphadenopathy:     Head:     Right side of head: No tonsillar adenopathy.     Left side of head: No tonsillar adenopathy.     Cervical: No cervical adenopathy.  Skin:    Findings: No erythema or rash.     Nails: There is no clubbing.  Neurological:     Mental Status: She is alert.    Diagnostics:    Spirometry was performed and demonstrated an FEV1 of 1.36 at 75 % of predicted.  Assessment and Plan:   1. Asthma, severe persistent, well-controlled   2. History of sarcoidosis   3. Other allergic rhinitis   4. LPRD (laryngopharyngeal reflux disease)     1. Continue Xolair injections  2. Continue Trelegy 200 - 1 inhalation 1 time per day  3. Continue Flonase - one spray each nostril 1 time per day.    4.  Continue omeprazole 40 twice a  day + Famotidine 40 mg in evening  5. If needed:   A. nasal saline   B. ProAir HFA or Duoneb   C. OTC Xyzal 5 mg one tablet 1-2 times per day   D. OTC Mucinex DM 1-2 tablet 1-2 times per day   E. ipratropium 0.06% nasal spray - 1-2 sprays each nostril every 6 hours   6.  Obtain fall flu vaccine   7.  Return to clinic in 6 months or earlier if problem  Anglea appears to be doing very well on her current plan which includes anti-inflammatory agents for her airway including the use of omalizumab injections and aggressive therapy directed against her reflux induced respiratory disease.  Assuming she continues to do well with this plan I will see her back in this clinic in 6 months or earlier if there is a problem.   Allena Katz, MD Allergy / Immunology [facility]

## 2021-07-22 ENCOUNTER — Encounter: Payer: Self-pay | Admitting: Allergy and Immunology

## 2021-07-28 DIAGNOSIS — I129 Hypertensive chronic kidney disease with stage 1 through stage 4 chronic kidney disease, or unspecified chronic kidney disease: Secondary | ICD-10-CM | POA: Diagnosis not present

## 2021-07-28 DIAGNOSIS — N189 Chronic kidney disease, unspecified: Secondary | ICD-10-CM | POA: Diagnosis not present

## 2021-07-28 DIAGNOSIS — J4530 Mild persistent asthma, uncomplicated: Secondary | ICD-10-CM | POA: Diagnosis not present

## 2021-07-28 DIAGNOSIS — E1122 Type 2 diabetes mellitus with diabetic chronic kidney disease: Secondary | ICD-10-CM | POA: Diagnosis not present

## 2021-08-04 ENCOUNTER — Other Ambulatory Visit: Payer: Self-pay

## 2021-08-04 ENCOUNTER — Ambulatory Visit (INDEPENDENT_AMBULATORY_CARE_PROVIDER_SITE_OTHER): Payer: Medicare HMO | Admitting: *Deleted

## 2021-08-04 DIAGNOSIS — J4550 Severe persistent asthma, uncomplicated: Secondary | ICD-10-CM

## 2021-08-05 ENCOUNTER — Other Ambulatory Visit: Payer: Self-pay | Admitting: Allergy and Immunology

## 2021-08-17 DIAGNOSIS — E1122 Type 2 diabetes mellitus with diabetic chronic kidney disease: Secondary | ICD-10-CM | POA: Diagnosis not present

## 2021-08-17 DIAGNOSIS — M810 Age-related osteoporosis without current pathological fracture: Secondary | ICD-10-CM | POA: Diagnosis not present

## 2021-08-17 DIAGNOSIS — Z6829 Body mass index (BMI) 29.0-29.9, adult: Secondary | ICD-10-CM | POA: Diagnosis not present

## 2021-08-17 DIAGNOSIS — L03116 Cellulitis of left lower limb: Secondary | ICD-10-CM | POA: Diagnosis not present

## 2021-08-17 DIAGNOSIS — D692 Other nonthrombocytopenic purpura: Secondary | ICD-10-CM | POA: Diagnosis not present

## 2021-08-17 DIAGNOSIS — S8991XA Unspecified injury of right lower leg, initial encounter: Secondary | ICD-10-CM | POA: Diagnosis not present

## 2021-08-17 DIAGNOSIS — J301 Allergic rhinitis due to pollen: Secondary | ICD-10-CM | POA: Diagnosis not present

## 2021-08-24 DIAGNOSIS — E1122 Type 2 diabetes mellitus with diabetic chronic kidney disease: Secondary | ICD-10-CM | POA: Diagnosis not present

## 2021-08-24 DIAGNOSIS — Z23 Encounter for immunization: Secondary | ICD-10-CM | POA: Diagnosis not present

## 2021-08-24 DIAGNOSIS — Z20822 Contact with and (suspected) exposure to covid-19: Secondary | ICD-10-CM | POA: Diagnosis not present

## 2021-08-24 DIAGNOSIS — Z6829 Body mass index (BMI) 29.0-29.9, adult: Secondary | ICD-10-CM | POA: Diagnosis not present

## 2021-08-24 DIAGNOSIS — S8991XA Unspecified injury of right lower leg, initial encounter: Secondary | ICD-10-CM | POA: Diagnosis not present

## 2021-08-24 DIAGNOSIS — S41119A Laceration without foreign body of unspecified upper arm, initial encounter: Secondary | ICD-10-CM | POA: Diagnosis not present

## 2021-08-24 DIAGNOSIS — Z20828 Contact with and (suspected) exposure to other viral communicable diseases: Secondary | ICD-10-CM | POA: Diagnosis not present

## 2021-08-26 ENCOUNTER — Other Ambulatory Visit: Payer: Self-pay

## 2021-08-27 DIAGNOSIS — E119 Type 2 diabetes mellitus without complications: Secondary | ICD-10-CM | POA: Diagnosis not present

## 2021-08-27 DIAGNOSIS — J4540 Moderate persistent asthma, uncomplicated: Secondary | ICD-10-CM | POA: Diagnosis not present

## 2021-09-01 ENCOUNTER — Ambulatory Visit: Payer: Medicare HMO | Admitting: Cardiology

## 2021-09-01 ENCOUNTER — Ambulatory Visit: Payer: Medicare HMO | Admitting: Orthopedic Surgery

## 2021-09-01 ENCOUNTER — Ambulatory Visit (INDEPENDENT_AMBULATORY_CARE_PROVIDER_SITE_OTHER): Payer: Medicare HMO | Admitting: *Deleted

## 2021-09-01 ENCOUNTER — Other Ambulatory Visit: Payer: Self-pay

## 2021-09-01 ENCOUNTER — Ambulatory Visit: Payer: Self-pay

## 2021-09-01 DIAGNOSIS — M1711 Unilateral primary osteoarthritis, right knee: Secondary | ICD-10-CM | POA: Diagnosis not present

## 2021-09-01 DIAGNOSIS — M25561 Pain in right knee: Secondary | ICD-10-CM | POA: Diagnosis not present

## 2021-09-01 DIAGNOSIS — J4550 Severe persistent asthma, uncomplicated: Secondary | ICD-10-CM

## 2021-09-04 ENCOUNTER — Encounter: Payer: Self-pay | Admitting: Orthopedic Surgery

## 2021-09-04 NOTE — Progress Notes (Signed)
Office Visit Note   Patient: [name]           Date of Birth: 1946/10/16           MRN: [id number] Visit Date: 09/01/2021 Requested by: Serita Grammes, MD [address] [place],  [place] [address] PCP: Serita Grammes, MD  Subjective: Chief Complaint  Patient presents with   Right Knee - Pain    HPI: [name] is a 75 y.o. female who presents to the office complaining of right knee pain.  Reports right knee pain since she stepped off her porch and fell about 5 to 6 weeks ago.  Localizes pain to the medial aspect of the knee.  She has history of arthritis.  She is able to weight-bear.  She wakes with pain about every night due to the knee pain.  Denies any groin pain.  She denies any history of taking blood thinners aside from a 1 mg aspirin.  She is taking Tylenol arthritis and occasional Voltaren for her pain.  No mechanical locking symptoms or instability of the knee..                ROS: All systems reviewed are negative as they relate to the chief complaint within the history of present illness.  Patient denies fevers or chills.  Assessment & Plan: Visit Diagnoses:  1. Primary osteoarthritis of right knee     Plan: Patient is a 75 year old female who presents for evaluation of right knee pain.  Pain is been a chronic issue for her with history of right knee arthritis but has been worse over the last 5 to 6 weeks since she fell after stepping off her porch.  Radiographs of the right knee taken today show no acute abnormality but do demonstrate moderate osteoarthritis, worst in the medial and patellofemoral compartments.  After discussion of options, patient would like to proceed with right knee cortisone injection.  Tolerated the injection well.  Follow-up with the office as needed if symptoms do not improve.  Follow-Up Instructions: No follow-ups on file.   Orders:  Orders Placed This Encounter  Procedures   XR Knee 1-2 Views Right   No orders of the  defined types were placed in this encounter.     Procedures: Large Joint Inj: R knee on 09/01/2021 11:10 AM Indications: diagnostic evaluation, joint swelling and pain Details: 18 G 1.5 in needle, superolateral approach  Arthrogram: No  Medications: 5 mL lidocaine 1 %; 40 mg methylPREDNISolone acetate 40 MG/ML; 4 mL bupivacaine 0.25 % Outcome: tolerated well, no immediate complications Procedure, treatment alternatives, risks and benefits explained, specific risks discussed. Consent was given by the patient. Immediately prior to procedure a time out was called to verify the correct patient, procedure, equipment, support staff and site/side marked as required. Patient was prepped and draped in the usual sterile fashion.      Clinical Data: No additional findings.  Objective: Vital Signs: There were no vitals taken for this visit.  Physical Exam:  Constitutional: Patient appears well-developed HEENT:  Head: Normocephalic Eyes:EOM are normal Neck: Normal range of motion Cardiovascular: Normal rate Pulmonary/chest: Effort normal Neurologic: Patient is alert Skin: Skin is warm Psychiatric: Patient has normal mood and affect  Ortho Exam: Ortho exam demonstrates right knee with no effusion.  Tenderness over the medial joint line primarily.  Mild tenderness over the lateral joint line.  Able to perform straight leg raise.  0 degrees extension and 115 degrees of knee flexion.  No pain with hip range of motion.  Negative Stinchfield sign.  Negative anterior/posterior drawer sign.  No laxity to varus/valgus stress at 0 or 30 degrees.  Specialty Comments:  No specialty comments available.  Imaging: No results found.   PMFS History: Patient Active Problem List   Diagnosis Date Noted   Allergy    Anxiety    Arthritis    Cataract    Depression    DM (diabetes mellitus) ([place])    Dyspnea    Heart murmur    History of kidney stones    Hyperlipidemia    Kidney stones    Sleep  apnea    Status post dilation of esophageal narrowing    Asthma 01/16/2020   History of transient ischemic attack (TIA) 01/16/2020   Bilateral carotid artery stenosis 01/16/2020   Normocytic anemia 01/16/2020   Diabetes mellitus type II, non insulin dependent ([place]) 07/05/2019   Mild aortic stenosis 07/05/2019   Mitral regurgitation 07/05/2019   Pedal edema 05/06/2019   DOE (dyspnea on exertion) 05/06/2019   Hearing loss, mixed, bilateral 12/17/2018   Bilateral primary osteoarthritis of knee 10/16/2018   OSA (obstructive sleep apnea) 09/28/2018   Shoulder arthritis 02/22/2018   Primary osteoarthritis, left shoulder 12/11/2017   DDD (degenerative disc disease), lumbar 01/23/2017   History of diabetes mellitus 01/23/2017   History of depression 01/23/2017   History of asthma 01/23/2017   Dyslipidemia 01/23/2017   Age-related osteoporosis without current pathological fracture 01/23/2017   History of gastroesophageal reflux (GERD) 01/17/2017   Primary osteoarthritis of both knees 01/17/2017   Primary osteoarthritis of both feet 01/17/2017   Vitamin D deficiency 01/17/2017   Rheumatoid factor positive 12/13/2016   Primary osteoarthritis of both hands 12/13/2016   Retained orthopedic hardware 11/14/2016   Pain in left wrist 06/06/2016   Stiffness of left wrist joint 06/06/2016   Closed fracture of distal end of radius 06/05/2016   Acquired trigger finger 04/20/2016   Obstructive lung disease ([place]) 07/29/2015   H/O allergic rhinitis 07/29/2015   LPRD (laryngopharyngeal reflux disease) 07/29/2015   Chest pain 11/01/2013   Murmur 11/01/2013   DM 03/03/2010   ABDOMINAL PAIN RIGHT LOWER QUADRANT 03/03/2010   FULL INCONTINENCE OF FECES 03/03/2010   H/O adenomatous polyp of colon 2011   DECREASED HEARING 05/02/2008   Essential hypertension 05/02/2008   ESOPHAGITIS 05/02/2008   HIATAL HERNIA 05/02/2008   RECTAL INCONTINENCE 05/02/2008   SARCOIDOSIS 11/16/2007   Severe persistent  asthma 11/16/2007   GERD 11/16/2007   IRRITABLE BOWEL SYNDROME 11/16/2007   Past Medical History:  Diagnosis Date   ABDOMINAL PAIN RIGHT LOWER QUADRANT 03/03/2010   Qualifier: Diagnosis of  By: Olevia Perches MD, Lowella Bandy    Acquired trigger finger 04/20/2016   Age-related osteoporosis without current pathological fracture 01/23/2017   Allergy    Anxiety    Arthritis    Asthma    Bilateral carotid artery stenosis 01/16/2020   Bilateral primary osteoarthritis of knee 10/16/2018   Cataract    bilateral and removed   Chest pain 11/01/2013   Closed fracture of distal end of radius 06/05/2016   DDD (degenerative disc disease), lumbar 01/23/2017   Depression    Diabetes mellitus type II, non insulin dependent ([facility]) 07/05/2019   Diaphragmatic hernia without mention of obstruction or gangrene    DM 03/03/2010   Qualifier: Diagnosis of  By: Tamala Julian CMA, Claiborne Billings     DM (diabetes mellitus) ([place])    DOE (dyspnea on exertion) 05/06/2019   Dyslipidemia  01/23/2017   Dyspnea    Esophageal reflux    Esophagitis, unspecified    Essential hypertension 05/02/2008   Qualifier: Diagnosis of  By: Nelson-Smith CMA (AAMA), Dottie     Full incontinence of feces 03/03/2010   Qualifier: Diagnosis of  By: Olevia Perches MD, Lowella Bandy    H/O adenomatous polyp of colon 2011   H/O allergic rhinitis 07/29/2015   Hearing loss, mixed, bilateral 12/17/2018   Heart murmur    no cardiologist, family Dr. Maryfrances Bunnell   History of asthma 01/23/2017   History of depression 01/23/2017   History of diabetes mellitus 01/23/2017   History of gastroesophageal reflux (GERD) 01/17/2017   History of kidney stones    History of transient ischemic attack (TIA) 01/16/2020   Hyperlipidemia    Incontinence of feces    Irritable bowel syndrome    Kidney stones    LPRD (laryngopharyngeal reflux disease) 07/29/2015   Mild aortic stenosis 07/05/2019   Mitral regurgitation 07/05/2019   Murmur 11/01/2013   Normocytic anemia 01/16/2020   Obstructive lung disease ([facility]) 07/29/2015    OSA (obstructive sleep apnea) 09/28/2018   Pain in left wrist 06/06/2016   Pedal edema 05/06/2019   Primary osteoarthritis of both feet 01/17/2017   Primary osteoarthritis of both hands 12/13/2016   Primary osteoarthritis of both knees 01/17/2017   Primary osteoarthritis, left shoulder 12/11/2017   Retained orthopedic hardware 11/14/2016   Rheumatoid factor positive 12/13/2016   Sarcoidosis    Severe persistent asthma 11/16/2007   Annotation: chronic Qualifier: Diagnosis of  By: Melvyn Novas MD, Christena Deem    Shoulder arthritis 02/22/2018   Sleep apnea    no cpap now   Status post dilation of esophageal narrowing    Stiffness of left wrist joint 06/06/2016   Unspecified hearing loss    Vitamin D deficiency 01/17/2017    Family History  Problem Relation Age of Onset   Esophageal cancer Father    Diabetes Mother    Diabetes Maternal Grandmother    Colon cancer Neg Hx    Rectal cancer Neg Hx    Stomach cancer Neg Hx     Past Surgical History:  Procedure Laterality Date   BLADDER SURGERY     CATARACT EXTRACTION Bilateral    HARDWARE REMOVAL Left 01/12/2017   Procedure: HARDWARE REMOVAL left wrist;  Surgeon: Leanora Cover, MD;  Location: [facility];  Service: Orthopedics;  Laterality: Left;   HEMORRHOID SURGERY     NASAL SEPTUM SURGERY     PARTIAL HYSTERECTOMY     REVERSE SHOULDER ARTHROPLASTY Left 02/22/2018   REVERSE SHOULDER ARTHROPLASTY Left 02/22/2018   Procedure: LEFT REVERSE SHOULDER ARTHROPLASTY;  Surgeon: Meredith Pel, MD;  Location: [facility];  Service: Orthopedics;  Laterality: Left;   TONSILLECTOMY AND ADENOIDECTOMY     TRANSCAROTID ARTERY REVASCULARIZATION  Left 01/17/2020   Procedure: TRANSCAROTID ARTERY REVASCULARIZATION;  Surgeon: Serafina Mitchell, MD;  Location: [facility];  Service: Vascular;  Laterality: Left;   TRIGGER FINGER RELEASE Left 05/12/2016   Procedure: RELEASE TRIGGER FINGER/A-1 PULLEY INFECTION LEFT RING TENDON SHEATH INJECT RIGHT INDEX FINGER;  Surgeon:  Leanora Cover, MD;  Location: [facility];  Service: Orthopedics;  Laterality: Left;   TUBAL LIGATION     Social History   Occupational History   Occupation: retired  Tobacco Use   Smoking status: Never   Smokeless tobacco: Never  Vaping Use   Vaping Use: Never used  Substance and Sexual Activity   Alcohol use: No  Drug use: No   Sexual activity: Not on file

## 2021-09-05 MED ORDER — METHYLPREDNISOLONE ACETATE 40 MG/ML IJ SUSP
40.0000 mg | INTRAMUSCULAR | Status: AC | PRN
Start: 2021-09-01 — End: 2021-09-01
  Administered 2021-09-01: 40 mg via INTRA_ARTICULAR

## 2021-09-05 MED ORDER — LIDOCAINE HCL 1 % IJ SOLN
5.0000 mL | INTRAMUSCULAR | Status: AC | PRN
  Administered 2021-09-01: 5 mL

## 2021-09-05 MED ORDER — BUPIVACAINE HCL 0.25 % IJ SOLN
4.0000 mL | INTRAMUSCULAR | Status: AC | PRN
  Administered 2021-09-01: 4 mL via INTRA_ARTICULAR

## 2021-09-08 DIAGNOSIS — Z6829 Body mass index (BMI) 29.0-29.9, adult: Secondary | ICD-10-CM | POA: Diagnosis not present

## 2021-09-08 DIAGNOSIS — S8991XA Unspecified injury of right lower leg, initial encounter: Secondary | ICD-10-CM | POA: Diagnosis not present

## 2021-09-08 DIAGNOSIS — D649 Anemia, unspecified: Secondary | ICD-10-CM | POA: Diagnosis not present

## 2021-09-08 DIAGNOSIS — R600 Localized edema: Secondary | ICD-10-CM | POA: Diagnosis not present

## 2021-09-08 DIAGNOSIS — Z23 Encounter for immunization: Secondary | ICD-10-CM | POA: Diagnosis not present

## 2021-09-08 DIAGNOSIS — J309 Allergic rhinitis, unspecified: Secondary | ICD-10-CM | POA: Diagnosis not present

## 2021-09-08 DIAGNOSIS — R0609 Other forms of dyspnea: Secondary | ICD-10-CM | POA: Diagnosis not present

## 2021-09-08 DIAGNOSIS — R0982 Postnasal drip: Secondary | ICD-10-CM | POA: Diagnosis not present

## 2021-09-27 DIAGNOSIS — J4540 Moderate persistent asthma, uncomplicated: Secondary | ICD-10-CM | POA: Diagnosis not present

## 2021-09-27 DIAGNOSIS — E119 Type 2 diabetes mellitus without complications: Secondary | ICD-10-CM | POA: Diagnosis not present

## 2021-09-29 ENCOUNTER — Ambulatory Visit (INDEPENDENT_AMBULATORY_CARE_PROVIDER_SITE_OTHER): Payer: Medicare HMO

## 2021-09-29 ENCOUNTER — Other Ambulatory Visit: Payer: Self-pay

## 2021-09-29 DIAGNOSIS — J4550 Severe persistent asthma, uncomplicated: Secondary | ICD-10-CM | POA: Diagnosis not present

## 2021-10-19 ENCOUNTER — Ambulatory Visit (HOSPITAL_COMMUNITY)
Admission: RE | Admit: 2021-10-19 | Discharge: 2021-10-19 | Disposition: A | Discharge status: Home / Self Care | Payer: Medicare HMO | Source: Ambulatory Visit | Attending: Vascular Surgery | Admitting: Vascular Surgery

## 2021-10-19 ENCOUNTER — Other Ambulatory Visit: Payer: Self-pay

## 2021-10-19 ENCOUNTER — Ambulatory Visit: Payer: Medicare HMO | Admitting: Physician Assistant

## 2021-10-19 VITALS — BP 197/67 | HR 61 | Temp 97.8°F | Resp 20 | Ht 60.0 in | Wt 165.7 lb

## 2021-10-19 DIAGNOSIS — I6523 Occlusion and stenosis of bilateral carotid arteries: Secondary | ICD-10-CM

## 2021-10-19 DIAGNOSIS — I771 Stricture of artery: Secondary | ICD-10-CM

## 2021-10-19 NOTE — Progress Notes (Signed)
Carotid Artery Follow-Up   VASCULAR SURGERY ASSESSMENT & PLAN:   [name] is a 75 y.o. female s/p Left TCAR in February 2021 by Dr. Trula Slade.  She presented with weakness, fatigue and slurred speech felt likely to represent a neurologic event from her left carotid artery.  Bilateral carotid artery stenosis: The patient has no symptoms referable to carotid artery stenosis.  Duplex examination today is stable as compared to 6 months ago.  Patent left carotid stent . we reviewed the signs and symptoms of stroke/TIA and advised the patient to call EMS should these occur.    Right subclavian artery stenosis.  Systolic velocities similar as compared to October 2021.  Radial pulses are symmetric.  Continue optimal medical management of diabetes, hypertension and follow-up with primary care physician. Negative history for tobacco use. Continue the following medications: Aspirin, statin Follow-up in 1 year with carotid duplex ultrasound.  SUBJECTIVE:   The patient denies monocular blindness, slurred speech, facial drooping, extremity weakness or numbness. She complains of left lower back, hip and thigh pain on arising in the morning. She denies claudication or  rest pain.   PHYSICAL EXAM:   Vitals:   10/19/21 1048 10/19/21 1051  BP: (!) 185/68 (!) 197/67  Pulse: 61   Resp: 20   Temp: 97.8 F (36.6 C)   SpO2: 98%      General appearance: Well-developed, well-nourished in no apparent distress Neurologic: Alert and oriented x4, face symmetric, speech fluent,  Cardiovascular: Heart rate and rhythm are regular with sys murmur.Pedal pulses are palpable.  Bilateral  carotid bruits versus radiation of murmur.  2+ symmetric bilateral radial pulses Respirations: Nonlabored, CTAB    NON-INVASIVE VASCULAR STUDIES   10/19/2021 Summary:  Right Carotid: Velocities in the right ICA are consistent with a 40-59% stenosis.   Left Carotid: Patent carotid artery stent with velocity in the  left ICA consistent with a 1 - 39% stenosis.   Vertebrals:  Bilateral vertebral arteries demonstrate antegrade flow.   Subclavians: Right subclavian artery was stenotic. Normal flow  hemodynamics were seen in the left subclavian artery.   *See table(s) above for measurements and observations.     Preliminary     PROBLEM LIST:    The patient's past medical history, past surgical history, family history, social history, allergy list and medication list are reviewed.   CURRENT MEDS:    Current Outpatient Medications:    acetaminophen (TYLENOL) 325 MG tablet, Take 2 tablets (650 mg total) by mouth every 6 (six) hours as needed for mild pain (or Fever >/= 101)., Disp:  , Rfl:    albuterol (VENTOLIN HFA) 108 (90 Base) MCG/ACT inhaler, Inhale 2 puffs into the lungs every 6 (six) hours as needed for wheezing or shortness of breath., Disp: , Rfl:    amLODipine (NORVASC) 5 MG tablet, Take 5 mg by mouth daily., Disp: , Rfl: 2   aspirin EC 81 MG tablet, Take 81 mg by mouth daily., Disp: , Rfl:    atorvastatin (LIPITOR) 40 MG tablet, Take 40 mg by mouth at bedtime., Disp: , Rfl:    azelastine (ASTELIN) 0.1 % nasal spray, Place 2 sprays into both nostrils 2 (two) times daily., Disp: , Rfl:    citalopram (CELEXA) 20 MG tablet, Take 20 mg by mouth daily., Disp: , Rfl:    dicyclomine (BENTYL) 10 MG capsule, Take 10 mg by mouth 4 (four) times daily., Disp: , Rfl:    EPINEPHrine 0.3 mg/0.3 mL IJ SOAJ injection, Use as  directed for life-threatening allergic reaction., Disp: 1 each, Rfl: 3   famotidine (PEPCID) 40 MG tablet, TAKE ONE TABLET BY MOUTH EVERYDAY AT BEDTIME, Disp: 30 tablet, Rfl: 4   fluticasone (FLONASE) 50 MCG/ACT nasal spray, Place 1-2 sprays into both nostrils as needed for allergies or rhinitis., Disp: , Rfl:    Fluticasone-Umeclidin-Vilant (TRELEGY ELLIPTA) 200-62.5-25 MCG/INH AEPB, Inhale 1 puff into the lungs every morning., Disp: 1 each, Rfl: 3   furosemide (LASIX) 20 MG tablet, Take  20 mg by mouth 2 (two) times daily. , Disp: , Rfl:    ipratropium (ATROVENT) 0.06 % nasal spray, Place 1-2 sprays into both nostrils 4 (four) times daily., Disp: , Rfl:    ipratropium-albuterol (DUONEB) 0.5-2.5 (3) MG/3ML SOLN, Take 3 mLs by nebulization every 6 (six) hours as needed for wheezing or shortness of breath (shortness of breath/wheezing)., Disp: , Rfl:    levocetirizine (XYZAL) 5 MG tablet, Take 5 mg by mouth at bedtime. , Disp: , Rfl:    losartan (COZAAR) 100 MG tablet, Take 100 mg by mouth at bedtime., Disp: , Rfl:    montelukast (SINGULAIR) 10 MG tablet, Take 10 mg by mouth daily., Disp: , Rfl:    omeprazole (PRILOSEC) 40 MG capsule, Take 40 mg by mouth 2 (two) times daily., Disp: , Rfl:    pioglitazone (ACTOS) 30 MG tablet, Take 30 mg by mouth daily., Disp: , Rfl:    Probiotic Product (PROBIOTIC DAILY PO), Take 1 capsule by mouth daily. , Disp: , Rfl:    Semaglutide,0.25 or 0.5MG /DOS, (OZEMPIC, 0.25 OR 0.5 MG/DOSE,) 2 MG/1.5ML SOPN, Inject 0.25 mg into the skin every 7 (seven) days., Disp: , Rfl:    tolterodine (DETROL LA) 4 MG 24 hr capsule, Take 4 mg by mouth at bedtime., Disp: , Rfl:   Current Facility-Administered Medications:    omalizumab Arvid Right) prefilled syringe 150 mg, 150 mg, Subcutaneous, Q28 days, Kozlow, Donnamarie Poag, MD, 150 mg at 09/29/21 1022   REVIEW OF SYSTEMS:   [X]  denotes positive finding, [ ]  denotes negative finding Cardiac  Comments:  Chest pain or chest pressure:    Shortness of breath upon exertion:    Short of breath when lying flat:    Irregular heart rhythm:        Vascular    Pain in calf, thigh, or hip brought on by ambulation:    Pain in feet at night that wakes you up from your sleep:     Blood clot in your veins:    Leg swelling:         Pulmonary    Oxygen at home:    Productive cough:     Wheezing:         Neurologic    Sudden weakness in arms or legs:     Sudden numbness in arms or legs:     Sudden onset of difficulty speaking or  slurred speech:    Temporary loss of vision in one eye:     Problems with dizziness:         Gastrointestinal    Blood in stool:     Vomited blood:         Genitourinary    Burning when urinating:     Blood in urine:        Psychiatric    Major depression:         Hematologic    Bleeding problems:    Problems with blood clotting too easily:  Skin    Rashes or ulcers:        Constitutional    Fever or chills:     Barbie Banner, PA-C  Office: [phone] 10/19/2021

## 2021-10-20 ENCOUNTER — Encounter: Payer: Self-pay | Admitting: Allergy and Immunology

## 2021-10-20 ENCOUNTER — Ambulatory Visit: Payer: Medicare HMO | Admitting: Allergy and Immunology

## 2021-10-20 VITALS — BP 148/82 | HR 60 | Resp 16

## 2021-10-20 DIAGNOSIS — K219 Gastro-esophageal reflux disease without esophagitis: Secondary | ICD-10-CM

## 2021-10-20 DIAGNOSIS — J4551 Severe persistent asthma with (acute) exacerbation: Secondary | ICD-10-CM | POA: Diagnosis not present

## 2021-10-20 DIAGNOSIS — Z862 Personal history of diseases of the blood and blood-forming organs and certain disorders involving the immune mechanism: Secondary | ICD-10-CM | POA: Diagnosis not present

## 2021-10-20 DIAGNOSIS — J3089 Other allergic rhinitis: Secondary | ICD-10-CM | POA: Diagnosis not present

## 2021-10-20 MED ORDER — METHYLPREDNISOLONE ACETATE 80 MG/ML IJ SUSP
80.0000 mg | Freq: Once | INTRAMUSCULAR | Status: AC
  Administered 2021-10-20: 80 mg via INTRAMUSCULAR

## 2021-10-20 NOTE — Progress Notes (Signed)
[place]   Follow-up Note  Referring Provider: Serita Grammes, MD Primary Provider: Serita Grammes, MD Date of Office Visit: 10/20/2021  Subjective:   [name] (DOB: 27-May-1946) is a 75 y.o. female who returns to [facility] on 10/20/2021 in re-evaluation of the following:  HPI: Lachell returns to this clinic in evaluation of asthma, allergic rhinitis, LPR, and a history of pulmonary sarcoidosis with fibrotic sequela.  Her last visit to this clinic was 21 July 2021.  Overall she has done relatively well and she was particularly happy about her status regarding her upper and lower airway while consistently using medications directed against inflammation and including the use of omalizumab.  Rarely did she use a short acting bronchodilator and for the most part she could exert herself to the extent that she so desired without much problem.  Unfortunately, about 2 to 3 weeks ago she started with a cough with some mucus production.  Although her cough and her mucus production has decreased she now has lots of dyspnea on exertion.  If she is sitting in the chair she breathes well.  If she exerts herself she really has very little air to do so.  Going up 1 set of stairs leaves her breathless at the top of the stairs.  She has not had any associated chest pain, fever, significant upper airway symptoms, flare of her reflux, or other symptoms.  Allergies as of 10/20/2021       Reactions   Codeine Nausea And Vomiting   Sulfa Antibiotics Other (See Comments)   Allergic per pt's mother         Medication List    acetaminophen 325 MG tablet Commonly known as: TYLENOL Take 2 tablets (650 mg total) by mouth every 6 (six) hours as needed for mild pain (or Fever >/= 101).   albuterol 108 (90 Base) MCG/ACT inhaler Commonly known as: VENTOLIN HFA Inhale 2 puffs into the lungs every 6 (six) hours as needed for  wheezing or shortness of breath.   amLODipine 5 MG tablet Commonly known as: NORVASC Take 5 mg by mouth daily.   aspirin EC 81 MG tablet Take 81 mg by mouth daily.   atorvastatin 40 MG tablet Commonly known as: LIPITOR Take 40 mg by mouth at bedtime.   azelastine 0.1 % nasal spray Commonly known as: ASTELIN Place 2 sprays into both nostrils 2 (two) times daily.   citalopram 20 MG tablet Commonly known as: CELEXA Take 20 mg by mouth daily.   dicyclomine 10 MG capsule Commonly known as: BENTYL Take 10 mg by mouth 4 (four) times daily.   EPINEPHrine 0.3 mg/0.3 mL Soaj injection Commonly known as: EPI-PEN Use as directed for life-threatening allergic reaction.   famotidine 40 MG tablet Commonly known as: PEPCID TAKE ONE TABLET BY MOUTH EVERYDAY AT BEDTIME   fluticasone 50 MCG/ACT nasal spray Commonly known as: FLONASE Place 1-2 sprays into both nostrils as needed for allergies or rhinitis.   furosemide 20 MG tablet Commonly known as: LASIX Take 20 mg by mouth 2 (two) times daily.   ipratropium 0.06 % nasal spray Commonly known as: ATROVENT Place 1-2 sprays into both nostrils 4 (four) times daily.   ipratropium-albuterol 0.5-2.5 (3) MG/3ML Soln Commonly known as: DUONEB Take 3 mLs by nebulization every 6 (six) hours as needed for wheezing or shortness of breath (shortness of breath/wheezing).   levocetirizine 5 MG tablet Commonly known as:  XYZAL Take 5 mg by mouth at bedtime.   losartan 100 MG tablet Commonly known as: COZAAR Take 100 mg by mouth at bedtime.   montelukast 10 MG tablet Commonly known as: SINGULAIR Take 10 mg by mouth daily.   omeprazole 40 MG capsule Commonly known as: PRILOSEC Take 40 mg by mouth 2 (two) times daily.   Ozempic (0.25 or 0.5 MG/DOSE) 2 MG/1.5ML Sopn Generic drug: Semaglutide(0.25 or 0.5MG /DOS) Inject 0.25 mg into the skin every 7 (seven) days.   pioglitazone 30 MG tablet Commonly known as: ACTOS Take 30 mg by mouth  daily.   PROBIOTIC DAILY PO Take 1 capsule by mouth daily.   tolterodine 4 MG 24 hr capsule Commonly known as: DETROL LA Take 4 mg by mouth at bedtime.   Trelegy Ellipta 200-62.5-25 MCG/ACT Aepb Generic drug: Fluticasone-Umeclidin-Vilant Inhale 1 puff into the lungs every morning.    Past Medical History:  Diagnosis Date   ABDOMINAL PAIN RIGHT LOWER QUADRANT 03/03/2010   Qualifier: Diagnosis of  By: Olevia Perches MD, Lowella Bandy    Acquired trigger finger 04/20/2016   Age-related osteoporosis without current pathological fracture 01/23/2017   Allergy    Anxiety    Arthritis    Asthma    Bilateral carotid artery stenosis 01/16/2020   Bilateral primary osteoarthritis of knee 10/16/2018   Cataract    bilateral and removed   Chest pain 11/01/2013   Closed fracture of distal end of radius 06/05/2016   DDD (degenerative disc disease), lumbar 01/23/2017   Depression    Diabetes mellitus type II, non insulin dependent ([place]) 07/05/2019   Diaphragmatic hernia without mention of obstruction or gangrene    DM 03/03/2010   Qualifier: Diagnosis of  By: Tamala Julian CMA, Claiborne Billings     DM (diabetes mellitus) ([place])    DOE (dyspnea on exertion) 05/06/2019   Dyslipidemia 01/23/2017   Dyspnea    Esophageal reflux    Esophagitis, unspecified    Essential hypertension 05/02/2008   Qualifier: Diagnosis of  By: Nelson-Smith CMA (AAMA), Dottie     Full incontinence of feces 03/03/2010   Qualifier: Diagnosis of  By: Olevia Perches MD, Lowella Bandy    H/O adenomatous polyp of colon 2011   H/O allergic rhinitis 07/29/2015   Hearing loss, mixed, bilateral 12/17/2018   Heart murmur    no cardiologist, family Dr. Maryfrances Bunnell   History of asthma 01/23/2017   History of depression 01/23/2017   History of diabetes mellitus 01/23/2017   History of gastroesophageal reflux (GERD) 01/17/2017   History of kidney stones    History of transient ischemic attack (TIA) 01/16/2020   Hyperlipidemia    Incontinence of feces    Irritable bowel syndrome    Kidney  stones    LPRD (laryngopharyngeal reflux disease) 07/29/2015   Mild aortic stenosis 07/05/2019   Mitral regurgitation 07/05/2019   Murmur 11/01/2013   Normocytic anemia 01/16/2020   Obstructive lung disease ([place]) 07/29/2015   OSA (obstructive sleep apnea) 09/28/2018   Pain in left wrist 06/06/2016   Pedal edema 05/06/2019   Primary osteoarthritis of both feet 01/17/2017   Primary osteoarthritis of both hands 12/13/2016   Primary osteoarthritis of both knees 01/17/2017   Primary osteoarthritis, left shoulder 12/11/2017   Retained orthopedic hardware 11/14/2016   Rheumatoid factor positive 12/13/2016   Sarcoidosis    Severe persistent asthma 11/16/2007   Annotation: chronic Qualifier: Diagnosis of  By: Melvyn Novas MD, Christena Deem    Shoulder arthritis 02/22/2018   Sleep apnea    no  cpap now   Status post dilation of esophageal narrowing    Stiffness of left wrist joint 06/06/2016   Unspecified hearing loss    Vitamin D deficiency 01/17/2017    Past Surgical History:  Procedure Laterality Date   BLADDER SURGERY     CATARACT EXTRACTION Bilateral    HARDWARE REMOVAL Left 01/12/2017   Procedure: HARDWARE REMOVAL left wrist;  Surgeon: Leanora Cover, MD;  Location: [facility];  Service: Orthopedics;  Laterality: Left;   HEMORRHOID SURGERY     NASAL SEPTUM SURGERY     PARTIAL HYSTERECTOMY     REVERSE SHOULDER ARTHROPLASTY Left 02/22/2018   REVERSE SHOULDER ARTHROPLASTY Left 02/22/2018   Procedure: LEFT REVERSE SHOULDER ARTHROPLASTY;  Surgeon: Meredith Pel, MD;  Location: [facility];  Service: Orthopedics;  Laterality: Left;   TONSILLECTOMY AND ADENOIDECTOMY     TRANSCAROTID ARTERY REVASCULARIZATION  Left 01/17/2020   Procedure: TRANSCAROTID ARTERY REVASCULARIZATION;  Surgeon: Serafina Mitchell, MD;  Location: [facility];  Service: Vascular;  Laterality: Left;   TRIGGER FINGER RELEASE Left 05/12/2016   Procedure: RELEASE TRIGGER FINGER/A-1 PULLEY INFECTION LEFT RING TENDON SHEATH INJECT RIGHT INDEX  FINGER;  Surgeon: Leanora Cover, MD;  Location: [facility];  Service: Orthopedics;  Laterality: Left;   TUBAL LIGATION      Review of systems negative except as noted in HPI / PMHx or noted below:  Review of Systems  Constitutional: Negative.   HENT: Negative.    Eyes: Negative.   Respiratory: Negative.    Cardiovascular: Negative.   Gastrointestinal: Negative.   Genitourinary: Negative.   Musculoskeletal: Negative.   Skin: Negative.   Neurological: Negative.   Endo/Heme/Allergies: Negative.   Psychiatric/Behavioral: Negative.      Objective:   Vitals:   10/20/21 1047  BP: (!) 148/82  Pulse: 60  Resp: 16  SpO2: 96%          Physical Exam Constitutional:      Appearance: She is not diaphoretic.  HENT:     Head: Normocephalic.     Right Ear: Tympanic membrane, ear canal and external ear normal.     Left Ear: Tympanic membrane, ear canal and external ear normal.     Nose: Nose normal. No mucosal edema or rhinorrhea.     Mouth/Throat:     Pharynx: Uvula midline. No oropharyngeal exudate.  Eyes:     Conjunctiva/sclera: Conjunctivae normal.  Neck:     Thyroid: No thyromegaly.     Trachea: Trachea normal. No tracheal tenderness or tracheal deviation.  Cardiovascular:     Rate and Rhythm: Normal rate and regular rhythm.     Heart sounds: S1 normal and S2 normal. Murmur (systolic) heard.  Pulmonary:     Effort: No respiratory distress.     Breath sounds: No stridor. Wheezing (End expiratory wheezing posterior lung fields) present. No rales.  Lymphadenopathy:     Head:     Right side of head: No tonsillar adenopathy.     Left side of head: No tonsillar adenopathy.     Cervical: No cervical adenopathy.  Skin:    Findings: No erythema or rash.     Nails: There is no clubbing.  Neurological:     Mental Status: She is alert.    Diagnostics:    Spirometry was performed and demonstrated an FEV1 of 0.95 at 53 % of predicted.  Her previous FEV1 was  1.36.  Oxygen saturation at rest on room air was 96%.  Oxygen saturation while  walking the hallway on room air was 94%.  Assessment and Plan:   1. Asthma, not well controlled, severe persistent, with acute exacerbation   2. History of sarcoidosis   3. Other allergic rhinitis   4. LPRD (laryngopharyngeal reflux disease)     1. Continue Xolair injections  2. Continue Trelegy 200 - 1 inhalation 1 time per day  3. Continue Flonase - one spray each nostril 1 time per day.    4. Continue omeprazole 40 twice a day + Famotidine 40 mg in evening  5. If needed:   A. nasal saline   B. ProAir HFA or Duoneb   C. OTC Xyzal 5 mg one tablet 1-2 times per day   D. OTC Mucinex DM 1-2 tablet 1-2 times per day   E. ipratropium 0.06% nasal spray - 1-2 sprays each nostril every 6 hours   6.  For this recent episode:   A.  Depo-Medrol 80 IM delivered in clinic today  B.  Check blood sugars  7.  Return to clinic in 6 months or earlier if problem  I suspect that Merisa probably contracted a viral respiratory tract infection giving rise to cough and mucus production several weeks ago and she is left with inflammation of her airway as a result of that infection.  I will treat her with a systemic steroid as noted above while she remains oin therapy with a selection of anti-inflammatory medications for her airway including use of omalizumab and continues to aggressively treat her reflux disease as noted above.  Assuming she does well with this plan I will see her back in this clinic in 6 months or earlier.  Given the fact that she did not deoxygenate when we exercised her we will hold off on any further evaluation for other etiologic factors contributing to her dyspnea on exertion at this point.  Certainly if this continues she will require further evaluation for the function of her cardiopulmonary system.  Allena Katz, MD Allergy / Immunology [place]

## 2021-10-20 NOTE — Patient Instructions (Addendum)
  1. Continue Xolair injections  2. Continue Trelegy 200 - 1 inhalation 1 time per day  3. Continue Flonase - one spray each nostril 1 time per day.    4. Continue omeprazole 40 twice a day + Famotidine 40 mg in evening  5. If needed:   A. nasal saline   B. ProAir HFA or Duoneb   C. OTC Xyzal 5 mg one tablet 1-2 times per day   D. OTC Mucinex DM 1-2 tablet 1-2 times per day   E. ipratropium 0.06% nasal spray - 1-2 sprays each nostril every 6 hours   6.  For this recent episode:   A.  Depo-Medrol 80 IM delivered in clinic today  B.  Check blood sugars  7.  Return to clinic in 6 months or earlier if problem

## 2021-10-25 ENCOUNTER — Encounter: Payer: Self-pay | Admitting: Allergy and Immunology

## 2021-10-25 ENCOUNTER — Ambulatory Visit: Payer: Medicare HMO | Admitting: Podiatry

## 2021-10-25 ENCOUNTER — Other Ambulatory Visit: Payer: Self-pay

## 2021-10-25 DIAGNOSIS — E1151 Type 2 diabetes mellitus with diabetic peripheral angiopathy without gangrene: Secondary | ICD-10-CM | POA: Diagnosis not present

## 2021-10-25 DIAGNOSIS — E1169 Type 2 diabetes mellitus with other specified complication: Secondary | ICD-10-CM

## 2021-10-25 DIAGNOSIS — B351 Tinea unguium: Secondary | ICD-10-CM

## 2021-10-25 NOTE — Progress Notes (Signed)
  Subjective:  Patient ID: [name], female    DOB: 05/25/1946,  MRN: [id number]  Chief Complaint  Patient presents with   debride    DFC -FBS: 120 a!C: good PCP: [facility] x 1 mo   75 y.o. female presents for routine foot care. Denies new issues.   Objective:  Physical Exam: warm, good capillary refill, nail exam onychomycosis of the toenails, no trophic changes or ulcerative lesions, normal DP and reduced PT pulses, and normal sensory exam. Left Foot: normal exam, no swelling, tenderness, instability; ligaments intact, full range of motion of all ankle/foot joints  Right Foot: normal exam, no swelling, tenderness, instability; ligaments intact, full range of motion of all ankle/foot joints   No images are attached to the encounter.  Assessment:   1. Onychomycosis of multiple toenails with type 2 diabetes mellitus and peripheral angiopathy ([place])    Plan:  Patient was evaluated and treated and all questions answered.  Onychomycosis, Diabetes and PAD -Patient is diabetic with a qualifying condition for at risk foot care.  Procedure: Nail Debridement Type of Debridement: manual, sharp debridement. Instrumentation: Nail nipper, rotary burr. Number of Nails: 10  Return in about 3 months (around 01/25/2022).

## 2021-10-27 ENCOUNTER — Ambulatory Visit: Payer: Medicare HMO

## 2021-10-27 ENCOUNTER — Other Ambulatory Visit: Payer: Self-pay

## 2021-10-27 ENCOUNTER — Ambulatory Visit: Payer: Medicare HMO | Admitting: Allergy and Immunology

## 2021-10-27 ENCOUNTER — Encounter: Payer: Self-pay | Admitting: Allergy and Immunology

## 2021-10-27 VITALS — BP 146/82 | HR 67 | Resp 16

## 2021-10-27 DIAGNOSIS — J4551 Severe persistent asthma with (acute) exacerbation: Secondary | ICD-10-CM

## 2021-10-27 DIAGNOSIS — J4550 Severe persistent asthma, uncomplicated: Secondary | ICD-10-CM | POA: Diagnosis not present

## 2021-10-27 DIAGNOSIS — J4540 Moderate persistent asthma, uncomplicated: Secondary | ICD-10-CM | POA: Diagnosis not present

## 2021-10-27 DIAGNOSIS — J3089 Other allergic rhinitis: Secondary | ICD-10-CM

## 2021-10-27 DIAGNOSIS — K219 Gastro-esophageal reflux disease without esophagitis: Secondary | ICD-10-CM | POA: Diagnosis not present

## 2021-10-27 DIAGNOSIS — E119 Type 2 diabetes mellitus without complications: Secondary | ICD-10-CM | POA: Diagnosis not present

## 2021-10-27 DIAGNOSIS — Z862 Personal history of diseases of the blood and blood-forming organs and certain disorders involving the immune mechanism: Secondary | ICD-10-CM

## 2021-10-27 NOTE — Patient Instructions (Addendum)
  1. Continue Xolair injections  2. Continue Trelegy 200 - 1 inhalation 1 time per day  3. Continue Flonase - one spray each nostril 1 time per day.    4. Continue omeprazole 40 twice a day + Famotidine 40 mg in evening  5. If needed:   A. nasal saline   B. ProAir HFA or Duoneb   C. OTC Mucinex DM 1-2 tablet 1-2 times per day   D. ipratropium 0.06% nasal spray - 1-2 sprays each nostril every 6 hours   6.  Discontinue Xyzal / levocetrizine to see if this helps with fatigue    7. Use prednisone 10 mg - 1 tablet 1 time per day for 5 days only. No more steroid after this administration. Check blood sugars.   8.  Return to clinic in 6 months or earlier if problem

## 2021-10-27 NOTE — Progress Notes (Signed)
[place]   Follow-up Note  Referring Provider: Serita Grammes, MD Primary Provider: Serita Grammes, MD Date of Office Visit: 10/27/2021  Subjective:   [name] (DOB: 1946/05/16) is a 75 y.o. female who returns to the [facility] on 10/27/2021 in re-evaluation of the following:  HPI: [name] returns to this clinic evaluation of asthma, allergic rhinitis, LPR, sarcoidosis with fibrotic sequela, and a apparent viral respiratory tract infection that started a few weeks before her last visit.  She has improved somewhat from her last visit.  Her cough has decreased a little and she is able to sleep through the night now using a CPAP.  But she has some sternal chest pain and some back pain and she has lots of fatigue.  She has not had any significant upper airway symptoms and no ugly nasal discharge and no sputum production and no other significant respiratory tract issues.  She continues on a large collection of therapy directed against respiratory tract inflammation and reflux that we prescribed during her last visit.  Allergies as of 10/27/2021       Reactions   Codeine Nausea And Vomiting   Sulfa Antibiotics Other (See Comments)   Allergic per pt's mother         Medication List    acetaminophen 325 MG tablet Commonly known as: TYLENOL Take 2 tablets (650 mg total) by mouth every 6 (six) hours as needed for mild pain (or Fever >/= 101).   albuterol 108 (90 Base) MCG/ACT inhaler Commonly known as: VENTOLIN HFA Inhale 2 puffs into the lungs every 6 (six) hours as needed for wheezing or shortness of breath.   amLODipine 5 MG tablet Commonly known as: NORVASC Take 5 mg by mouth daily.   aspirin EC 81 MG tablet Take 81 mg by mouth daily.   atorvastatin 40 MG tablet Commonly known as: LIPITOR Take 40 mg by mouth at bedtime.   azelastine 0.1 % nasal spray Commonly known as: ASTELIN Place 2 sprays  into both nostrils 2 (two) times daily.   citalopram 20 MG tablet Commonly known as: CELEXA Take 20 mg by mouth daily.   dicyclomine 10 MG capsule Commonly known as: BENTYL Take 10 mg by mouth 4 (four) times daily.   EPINEPHrine 0.3 mg/0.3 mL Soaj injection Commonly known as: EPI-PEN Use as directed for life-threatening allergic reaction.   famotidine 40 MG tablet Commonly known as: PEPCID TAKE ONE TABLET BY MOUTH EVERYDAY AT BEDTIME   fluticasone 50 MCG/ACT nasal spray Commonly known as: FLONASE Place 1-2 sprays into both nostrils as needed for allergies or rhinitis.   furosemide 20 MG tablet Commonly known as: LASIX Take 20 mg by mouth 2 (two) times daily.   ipratropium 0.06 % nasal spray Commonly known as: ATROVENT Place 1-2 sprays into both nostrils 4 (four) times daily.   ipratropium-albuterol 0.5-2.5 (3) MG/3ML Soln Commonly known as: DUONEB Take 3 mLs by nebulization every 6 (six) hours as needed for wheezing or shortness of breath (shortness of breath/wheezing).   levocetirizine 5 MG tablet Commonly known as: XYZAL Take 5 mg by mouth at bedtime.   losartan 100 MG tablet Commonly known as: COZAAR Take 100 mg by mouth at bedtime.   montelukast 10 MG tablet Commonly known as: SINGULAIR Take 10 mg by mouth daily.   omeprazole 40 MG capsule Commonly known as: PRILOSEC Take 40 mg by mouth 2 (two) times daily.   Ozempic (0.25  or 0.5 MG/DOSE) 2 MG/1.5ML Sopn Generic drug: Semaglutide(0.25 or 0.5MG /DOS) Inject 0.25 mg into the skin every 7 (seven) days.   pioglitazone 30 MG tablet Commonly known as: ACTOS Take 30 mg by mouth daily.   PROBIOTIC DAILY PO Take 1 capsule by mouth daily.   tolterodine 4 MG 24 hr capsule Commonly known as: DETROL LA Take 4 mg by mouth at bedtime.   Trelegy Ellipta 200-62.5-25 MCG/ACT Aepb Generic drug: Fluticasone-Umeclidin-Vilant Inhale 1 puff into the lungs every morning.    Past Medical History:  Diagnosis Date    ABDOMINAL PAIN RIGHT LOWER QUADRANT 03/03/2010   Qualifier: Diagnosis of  By: Olevia Perches MD, Lowella Bandy    Acquired trigger finger 04/20/2016   Age-related osteoporosis without current pathological fracture 01/23/2017   Allergy    Anxiety    Arthritis    Asthma    Bilateral carotid artery stenosis 01/16/2020   Bilateral primary osteoarthritis of knee 10/16/2018   Cataract    bilateral and removed   Chest pain 11/01/2013   Closed fracture of distal end of radius 06/05/2016   DDD (degenerative disc disease), lumbar 01/23/2017   Depression    Diabetes mellitus type II, non insulin dependent ([place]) 07/05/2019   Diaphragmatic hernia without mention of obstruction or gangrene    DM 03/03/2010   Qualifier: Diagnosis of  By: Tamala Julian CMA, Claiborne Billings     DM (diabetes mellitus) ([place])    DOE (dyspnea on exertion) 05/06/2019   Dyslipidemia 01/23/2017   Dyspnea    Esophageal reflux    Esophagitis, unspecified    Essential hypertension 05/02/2008   Qualifier: Diagnosis of  By: Nelson-Smith CMA (AAMA), Dottie     Full incontinence of feces 03/03/2010   Qualifier: Diagnosis of  By: Olevia Perches MD, Lowella Bandy    H/O adenomatous polyp of colon 2011   H/O allergic rhinitis 07/29/2015   Hearing loss, mixed, bilateral 12/17/2018   Heart murmur    no cardiologist, family Dr. Maryfrances Bunnell   History of asthma 01/23/2017   History of depression 01/23/2017   History of diabetes mellitus 01/23/2017   History of gastroesophageal reflux (GERD) 01/17/2017   History of kidney stones    History of transient ischemic attack (TIA) 01/16/2020   Hyperlipidemia    Incontinence of feces    Irritable bowel syndrome    Kidney stones    LPRD (laryngopharyngeal reflux disease) 07/29/2015   Mild aortic stenosis 07/05/2019   Mitral regurgitation 07/05/2019   Murmur 11/01/2013   Normocytic anemia 01/16/2020   Obstructive lung disease ([place]) 07/29/2015   OSA (obstructive sleep apnea) 09/28/2018   Pain in left wrist 06/06/2016   Pedal edema 05/06/2019   Primary osteoarthritis  of both feet 01/17/2017   Primary osteoarthritis of both hands 12/13/2016   Primary osteoarthritis of both knees 01/17/2017   Primary osteoarthritis, left shoulder 12/11/2017   Retained orthopedic hardware 11/14/2016   Rheumatoid factor positive 12/13/2016   Sarcoidosis    Severe persistent asthma 11/16/2007   Annotation: chronic Qualifier: Diagnosis of  By: Melvyn Novas MD, Christena Deem    Shoulder arthritis 02/22/2018   Sleep apnea    no cpap now   Status post dilation of esophageal narrowing    Stiffness of left wrist joint 06/06/2016   Unspecified hearing loss    Vitamin D deficiency 01/17/2017    Past Surgical History:  Procedure Laterality Date   BLADDER SURGERY     CATARACT EXTRACTION Bilateral    HARDWARE REMOVAL Left 01/12/2017   Procedure: HARDWARE REMOVAL  left wrist;  Surgeon: Leanora Cover, MD;  Location: [facility];  Service: Orthopedics;  Laterality: Left;   HEMORRHOID SURGERY     NASAL SEPTUM SURGERY     PARTIAL HYSTERECTOMY     REVERSE SHOULDER ARTHROPLASTY Left 02/22/2018   REVERSE SHOULDER ARTHROPLASTY Left 02/22/2018   Procedure: LEFT REVERSE SHOULDER ARTHROPLASTY;  Surgeon: Meredith Pel, MD;  Location: [facility];  Service: Orthopedics;  Laterality: Left;   TONSILLECTOMY AND ADENOIDECTOMY     TRANSCAROTID ARTERY REVASCULARIZATION  Left 01/17/2020   Procedure: TRANSCAROTID ARTERY REVASCULARIZATION;  Surgeon: Serafina Mitchell, MD;  Location: [facility];  Service: Vascular;  Laterality: Left;   TRIGGER FINGER RELEASE Left 05/12/2016   Procedure: RELEASE TRIGGER FINGER/A-1 PULLEY INFECTION LEFT RING TENDON SHEATH INJECT RIGHT INDEX FINGER;  Surgeon: Leanora Cover, MD;  Location: [facility];  Service: Orthopedics;  Laterality: Left;   TUBAL LIGATION      Review of systems negative except as noted in HPI / PMHx or noted below:  Review of Systems  Constitutional: Negative.   HENT: Negative.    Eyes: Negative.   Respiratory: Negative.    Cardiovascular:  Negative.   Gastrointestinal: Negative.   Genitourinary: Negative.   Musculoskeletal: Negative.   Skin: Negative.   Neurological: Negative.   Endo/Heme/Allergies: Negative.   Psychiatric/Behavioral: Negative.      Objective:   Vitals:   10/27/21 1619  BP: (!) 146/82  Pulse: 67  Resp: 16  SpO2: 97%          Physical Exam Constitutional:      Appearance: She is not diaphoretic.  HENT:     Head: Normocephalic.     Right Ear: External ear normal.     Left Ear: External ear normal.     Nose: Nose normal. No mucosal edema or rhinorrhea.     Mouth/Throat:     Pharynx: Uvula midline. No oropharyngeal exudate.  Eyes:     Conjunctiva/sclera: Conjunctivae normal.  Neck:     Thyroid: No thyromegaly.     Trachea: Trachea normal. No tracheal tenderness or tracheal deviation.  Cardiovascular:     Rate and Rhythm: Normal rate and regular rhythm.     Heart sounds: Normal heart sounds, S1 normal and S2 normal. No murmur heard. Pulmonary:     Effort: No respiratory distress.     Breath sounds: Normal breath sounds. No stridor. No wheezing or rales.  Lymphadenopathy:     Head:     Right side of head: No tonsillar adenopathy.     Left side of head: No tonsillar adenopathy.     Cervical: No cervical adenopathy.  Skin:    Findings: No erythema or rash.     Nails: There is no clubbing.  Neurological:     Mental Status: She is alert.    Diagnostics:    Spirometry was performed and demonstrated an FEV1 of 1.03 at 57 % of predicted.  Assessment and Plan:   1. Asthma, not well controlled, severe persistent, with acute exacerbation   2. History of sarcoidosis   3. Other allergic rhinitis   4. LPRD (laryngopharyngeal reflux disease)     1. Continue Xolair injections  2. Continue Trelegy 200 - 1 inhalation 1 time per day  3. Continue Flonase - one spray each nostril 1 time per day.    4. Continue omeprazole 40 twice a day + Famotidine 40 mg in evening  5. If needed:   A.  nasal saline  B. ProAir HFA or Duoneb   C. OTC Mucinex DM 1-2 tablet 1-2 times per day   D. ipratropium 0.06% nasal spray - 1-2 sprays each nostril every 6 hours   6.  Discontinue Xyzal / levocetrizine to see if this helps with fatigue    7. Use prednisone 10 mg - 1 tablet 1 time per day for 5 days only. No more steroid after this administration. Check blood sugars.   8.  Return to clinic in 6 months or earlier if problem  I think Denny is better than she was last week and I will have her use just a little bit more low-dose systemic steroid as noted above to clear out any lingering inflammation that may be present in her airway the fact that she has some chest pain that migrates to her back raises the possibility of possible pericarditis which she has had in the past but at this point we will not have her undergo any further evaluation for this issue.  She does have significant fatigue and we will eliminate her levocetirizine to see if this helps with this issue.  She will keep in contact with me noting her response to this approach.  If she does well see her back in this clinic in 6 months or earlier if there is a problem.   Allena Katz, MD Allergy / Immunology [place]

## 2021-10-28 ENCOUNTER — Encounter: Payer: Self-pay | Admitting: Allergy and Immunology

## 2021-10-29 DIAGNOSIS — N3946 Mixed incontinence: Secondary | ICD-10-CM | POA: Diagnosis not present

## 2021-10-29 DIAGNOSIS — R350 Frequency of micturition: Secondary | ICD-10-CM | POA: Diagnosis not present

## 2021-11-05 ENCOUNTER — Encounter: Payer: Self-pay | Admitting: Cardiology

## 2021-11-05 ENCOUNTER — Ambulatory Visit: Payer: Medicare HMO | Admitting: Cardiology

## 2021-11-05 ENCOUNTER — Other Ambulatory Visit: Payer: Self-pay

## 2021-11-05 VITALS — BP 160/88 | HR 65 | Ht 61.0 in | Wt 163.0 lb

## 2021-11-05 DIAGNOSIS — I10 Essential (primary) hypertension: Secondary | ICD-10-CM | POA: Diagnosis not present

## 2021-11-05 DIAGNOSIS — I350 Nonrheumatic aortic (valve) stenosis: Secondary | ICD-10-CM

## 2021-11-05 DIAGNOSIS — E782 Mixed hyperlipidemia: Secondary | ICD-10-CM | POA: Diagnosis not present

## 2021-11-05 NOTE — Patient Instructions (Signed)

## 2021-11-05 NOTE — Progress Notes (Signed)
Cardiology Office Note:    Date:  11/05/2021   ID:  [name], DOB 10-Jun-1946, MRN [id number]  PCP:  Serita Grammes, MD  Cardiologist:  Jenean Lindau, MD   Referring MD: Serita Grammes, MD    ASSESSMENT:    1. Essential hypertension   2. Mixed hyperlipidemia   3. Mild aortic stenosis    PLAN:    In order of problems listed above:  Primary prevention stressed with the patient.  Importance of compliance with diet medication stressed and she vocalized understanding.  She was advised to walk at least half an hour a day 5 days a week and she promises to do so. Essential hypertension: Blood pressure stable and diet was emphasized.  Lifestyle modification urged.  She has an element of whitecoat hypertension and told me that blood pressures are finally doing. Mixed dyslipidemia: Lipids reviewed and followed by primary care.  She recently was told that her lipids are fine Diabetes mellitus: She was referred by primary care.  Hemoglobin A1c was about 6 and she is happy about it. Mild aortic stenosis: Asymptomatic and medical management. Obesity: Weight reduction stressed and diet was emphasized. Patient will be seen in follow-up appointment in 6 months or earlier if the patient has any concerns    Medication Adjustments/Labs and Tests Ordered: Current medicines are reviewed at length with the patient today.  Concerns regarding medicines are outlined above.  Orders Placed This Encounter  Procedures   EKG 12-Lead   No orders of the defined types were placed in this encounter.    No chief complaint on file.    History of Present Illness:    [name] is a 75 y.o. female.  Patient has past medical history of essential hypertension, dyslipidemia, diabetes mellitus and obesity.  She has mild aortic stenosis.  She denies any problems at this time and takes care of activities of daily living.  No chest pain orthopnea or PND.  She tells me that her blood pressures  are fine at home and she has an element of whitecoat hypertension.  At the time of my evaluation, the patient is alert awake oriented and in no distress.  Past Medical History:  Diagnosis Date   ABDOMINAL PAIN RIGHT LOWER QUADRANT 03/03/2010   Qualifier: Diagnosis of  By: Olevia Perches MD, Lowella Bandy    Acquired trigger finger 04/20/2016   Age-related osteoporosis without current pathological fracture 01/23/2017   Allergy    Anxiety    Arthritis    Asthma    Bilateral carotid artery stenosis 01/16/2020   Bilateral primary osteoarthritis of knee 10/16/2018   Cataract    bilateral and removed   Chest pain 11/01/2013   Closed fracture of distal end of radius 06/05/2016   DDD (degenerative disc disease), lumbar 01/23/2017   Depression    Diabetes mellitus type II, non insulin dependent ([place]) 07/05/2019   Diaphragmatic hernia without mention of obstruction or gangrene    DM 03/03/2010   Qualifier: Diagnosis of  By: Tamala Julian CMA, Claiborne Billings     DM (diabetes mellitus) ([place])    DOE (dyspnea on exertion) 05/06/2019   Dyslipidemia 01/23/2017   Dyspnea    Esophageal reflux    Esophagitis, unspecified    Essential hypertension 05/02/2008   Qualifier: Diagnosis of  By: Nelson-Smith CMA (AAMA), Dottie     Full incontinence of feces 03/03/2010   Qualifier: Diagnosis of  By: Olevia Perches MD, Lowella Bandy    H/O adenomatous polyp of colon 2011  H/O allergic rhinitis 07/29/2015   Hearing loss, mixed, bilateral 12/17/2018   Heart murmur    no cardiologist, family Dr. Maryfrances Bunnell   History of asthma 01/23/2017   History of depression 01/23/2017   History of diabetes mellitus 01/23/2017   History of gastroesophageal reflux (GERD) 01/17/2017   History of kidney stones    History of transient ischemic attack (TIA) 01/16/2020   Hyperlipidemia    Incontinence of feces    Irritable bowel syndrome    Kidney stones    LPRD (laryngopharyngeal reflux disease) 07/29/2015   Mild aortic stenosis 07/05/2019   Mitral regurgitation 07/05/2019   Murmur 11/01/2013    Normocytic anemia 01/16/2020   Obstructive lung disease ([place]) 07/29/2015   OSA (obstructive sleep apnea) 09/28/2018   Pain in left wrist 06/06/2016   Pedal edema 05/06/2019   Primary osteoarthritis of both feet 01/17/2017   Primary osteoarthritis of both hands 12/13/2016   Primary osteoarthritis of both knees 01/17/2017   Primary osteoarthritis, left shoulder 12/11/2017   Retained orthopedic hardware 11/14/2016   Rheumatoid factor positive 12/13/2016   Sarcoidosis    Severe persistent asthma 11/16/2007   Annotation: chronic Qualifier: Diagnosis of  By: Melvyn Novas MD, Christena Deem    Shoulder arthritis 02/22/2018   Sleep apnea    no cpap now   Status post dilation of esophageal narrowing    Stiffness of left wrist joint 06/06/2016   Unspecified hearing loss    Vitamin D deficiency 01/17/2017    Past Surgical History:  Procedure Laterality Date   BLADDER SURGERY     CATARACT EXTRACTION Bilateral    HARDWARE REMOVAL Left 01/12/2017   Procedure: HARDWARE REMOVAL left wrist;  Surgeon: Leanora Cover, MD;  Location: [facility];  Service: Orthopedics;  Laterality: Left;   HEMORRHOID SURGERY     NASAL SEPTUM SURGERY     PARTIAL HYSTERECTOMY     REVERSE SHOULDER ARTHROPLASTY Left 02/22/2018   REVERSE SHOULDER ARTHROPLASTY Left 02/22/2018   Procedure: LEFT REVERSE SHOULDER ARTHROPLASTY;  Surgeon: Meredith Pel, MD;  Location: [facility];  Service: Orthopedics;  Laterality: Left;   TONSILLECTOMY AND ADENOIDECTOMY     TRANSCAROTID ARTERY REVASCULARIZATION  Left 01/17/2020   Procedure: TRANSCAROTID ARTERY REVASCULARIZATION;  Surgeon: Serafina Mitchell, MD;  Location: [facility];  Service: Vascular;  Laterality: Left;   TRIGGER FINGER RELEASE Left 05/12/2016   Procedure: RELEASE TRIGGER FINGER/A-1 PULLEY INFECTION LEFT RING TENDON SHEATH INJECT RIGHT INDEX FINGER;  Surgeon: Leanora Cover, MD;  Location: [facility];  Service: Orthopedics;  Laterality: Left;   TUBAL LIGATION      Current  Medications: Current Meds  Medication Sig   acetaminophen (TYLENOL) 325 MG tablet Take 2 tablets (650 mg total) by mouth every 6 (six) hours as needed for mild pain (or Fever >/= 101).   albuterol (VENTOLIN HFA) 108 (90 Base) MCG/ACT inhaler Inhale 2 puffs into the lungs every 6 (six) hours as needed for wheezing or shortness of breath.   amLODipine (NORVASC) 5 MG tablet Take 5 mg by mouth daily.   aspirin EC 81 MG tablet Take 81 mg by mouth daily.   atorvastatin (LIPITOR) 40 MG tablet Take 40 mg by mouth at bedtime.   azelastine (ASTELIN) 0.1 % nasal spray Place 2 sprays into both nostrils 2 (two) times daily.   citalopram (CELEXA) 20 MG tablet Take 20 mg by mouth daily.   dicyclomine (BENTYL) 10 MG capsule Take 10 mg by mouth 4 (four) times daily.   EPINEPHrine  0.3 mg/0.3 mL IJ SOAJ injection Use as directed for life-threatening allergic reaction.   famotidine (PEPCID) 40 MG tablet TAKE ONE TABLET BY MOUTH EVERYDAY AT BEDTIME   fluticasone (FLONASE) 50 MCG/ACT nasal spray Place 1-2 sprays into both nostrils as needed for allergies or rhinitis.   Fluticasone-Umeclidin-Vilant (TRELEGY ELLIPTA) 200-62.5-25 MCG/INH AEPB Inhale 1 puff into the lungs every morning.   furosemide (LASIX) 20 MG tablet Take 20 mg by mouth 2 (two) times daily.    ipratropium (ATROVENT) 0.06 % nasal spray Place 1-2 sprays into both nostrils 4 (four) times daily.   ipratropium-albuterol (DUONEB) 0.5-2.5 (3) MG/3ML SOLN Take 3 mLs by nebulization every 6 (six) hours as needed for wheezing or shortness of breath (shortness of breath/wheezing).   levocetirizine (XYZAL) 5 MG tablet Take 5 mg by mouth at bedtime.    losartan (COZAAR) 100 MG tablet Take 100 mg by mouth at bedtime.   montelukast (SINGULAIR) 10 MG tablet Take 10 mg by mouth daily.   omeprazole (PRILOSEC) 40 MG capsule Take 40 mg by mouth 2 (two) times daily.   pioglitazone (ACTOS) 30 MG tablet Take 30 mg by mouth daily.   Probiotic Product (PROBIOTIC DAILY PO)  Take 1 capsule by mouth daily.    Semaglutide,0.25 or 0.5MG /DOS, (OZEMPIC, 0.25 OR 0.5 MG/DOSE,) 2 MG/1.5ML SOPN Inject 0.25 mg into the skin every 7 (seven) days.   tolterodine (DETROL LA) 4 MG 24 hr capsule Take 4 mg by mouth at bedtime.   Current Facility-Administered Medications for the 11/05/21 encounter (Office Visit) with Grayson White, Reita Cliche, MD  Medication   omalizumab Arvid Right) prefilled syringe 150 mg     Allergies:   Codeine and Sulfa antibiotics   Social History   Socioeconomic History   Marital status: Married    Spouse name: Not on file   Number of children: 2   Years of education: Not on file   Highest education level: Not on file  Occupational History   Occupation: retired  Tobacco Use   Smoking status: Never   Smokeless tobacco: Never  Vaping Use   Vaping Use: Never used  Substance and Sexual Activity   Alcohol use: No   Drug use: No   Sexual activity: Not on file  Other Topics Concern   Not on file  Social History Narrative   Daily caffeine use.    Social Determinants of Health   Financial Resource Strain: Not on file  Food Insecurity: Not on file  Transportation Needs: Not on file  Physical Activity: Not on file  Stress: Not on file  Social Connections: Not on file     Family History: The patient's family history includes Diabetes in her maternal grandmother and mother; Esophageal cancer in her father. There is no history of Colon cancer, Rectal cancer, or Stomach cancer.  ROS:   Please see the history of present illness.    All other systems reviewed and are negative.  EKGs/Labs/Other Studies Reviewed:    The following studies were reviewed today: I discussed my findings with the patient at length   Recent Labs: No results found for requested labs within last 8760 hours.  Recent Lipid Panel    Component Value Date/Time   CHOL 139 07/21/2020 0856   TRIG 92 07/21/2020 0856   HDL 59 07/21/2020 0856   CHOLHDL 2.4 07/21/2020 0856   LDLCALC  63 07/21/2020 0856    Physical Exam:    VS:  BP (!) 160/88   Pulse 65   Ht 5'  1" (1.549 m)   Wt 163 lb (73.9 kg)   SpO2 97%   BMI 30.80 kg/m     Wt Readings from Last 3 Encounters:  11/05/21 163 lb (73.9 kg)  10/19/21 165 lb 11.2 oz (75.2 kg)  04/05/21 164 lb (74.4 kg)     GEN: Patient is in no acute distress HEENT: Normal NECK: No JVD; No carotid bruits LYMPHATICS: No lymphadenopathy CARDIAC: Hear sounds regular, 2/6 systolic murmur at the apex. RESPIRATORY:  Clear to auscultation without rales, wheezing or rhonchi  ABDOMEN: Soft, non-tender, non-distended MUSCULOSKELETAL:  No edema; No deformity  SKIN: Warm and dry NEUROLOGIC:  Alert and oriented x 3 PSYCHIATRIC:  Normal affect   Signed, Jenean Lindau, MD  11/05/2021 2:57 PM    [facility]

## 2021-11-24 ENCOUNTER — Ambulatory Visit (INDEPENDENT_AMBULATORY_CARE_PROVIDER_SITE_OTHER): Payer: Medicare HMO

## 2021-11-24 ENCOUNTER — Other Ambulatory Visit: Payer: Self-pay

## 2021-11-24 DIAGNOSIS — J4550 Severe persistent asthma, uncomplicated: Secondary | ICD-10-CM | POA: Diagnosis not present

## 2021-11-26 DIAGNOSIS — J4540 Moderate persistent asthma, uncomplicated: Secondary | ICD-10-CM | POA: Diagnosis not present

## 2021-11-26 DIAGNOSIS — E119 Type 2 diabetes mellitus without complications: Secondary | ICD-10-CM | POA: Diagnosis not present

## 2021-12-16 DIAGNOSIS — H90A31 Mixed conductive and sensorineural hearing loss, unilateral, right ear with restricted hearing on the contralateral side: Secondary | ICD-10-CM | POA: Diagnosis not present

## 2021-12-22 ENCOUNTER — Ambulatory Visit (INDEPENDENT_AMBULATORY_CARE_PROVIDER_SITE_OTHER): Payer: Medicare HMO | Admitting: *Deleted

## 2021-12-22 ENCOUNTER — Other Ambulatory Visit: Payer: Self-pay

## 2021-12-22 DIAGNOSIS — J4550 Severe persistent asthma, uncomplicated: Secondary | ICD-10-CM | POA: Diagnosis not present

## 2021-12-27 ENCOUNTER — Other Ambulatory Visit: Payer: Self-pay | Admitting: *Deleted

## 2021-12-27 MED ORDER — OMALIZUMAB 150 MG/ML ~~LOC~~ SOSY: 150.0000 mg | PREFILLED_SYRINGE | SUBCUTANEOUS | 11 refills | Status: DC

## 2021-12-28 ENCOUNTER — Encounter: Payer: Self-pay | Admitting: Pulmonary Disease

## 2021-12-28 ENCOUNTER — Other Ambulatory Visit: Payer: Self-pay

## 2021-12-28 ENCOUNTER — Ambulatory Visit: Payer: Medicare HMO | Admitting: Pulmonary Disease

## 2021-12-28 VITALS — BP 108/70 | HR 63 | Temp 97.8°F | Ht 60.0 in | Wt 166.0 lb

## 2021-12-28 DIAGNOSIS — J4540 Moderate persistent asthma, uncomplicated: Secondary | ICD-10-CM | POA: Diagnosis not present

## 2021-12-28 DIAGNOSIS — J449 Chronic obstructive pulmonary disease, unspecified: Secondary | ICD-10-CM | POA: Diagnosis not present

## 2021-12-28 DIAGNOSIS — E119 Type 2 diabetes mellitus without complications: Secondary | ICD-10-CM | POA: Diagnosis not present

## 2021-12-28 DIAGNOSIS — G4733 Obstructive sleep apnea (adult) (pediatric): Secondary | ICD-10-CM | POA: Diagnosis not present

## 2021-12-28 NOTE — Progress Notes (Signed)
[facility]  Chief Complaint  Patient presents with   Follow-up    Cpap compliance     Constitutional:  BP 108/70 (BP Location: Left Arm, Cuff Size: Normal)    Pulse 63    Temp 97.8 F (36.6 C) (Oral)    Ht 5' (1.524 m)    Wt 166 lb (75.3 kg)    SpO2 96%    BMI 32.42 kg/m   Past Medical History:  Allergies, Anxiety, Depression, Hiatal hernia, DM, GERD, Colon polyp, Nephrolithiasis, HLD, IBS, Sarcoidosis, HTN  Past Surgical History:  She  has a past surgical history that includes Partial hysterectomy; Tonsillectomy and adenoidectomy; Nasal septum surgery; Tubal ligation; Hemorrhoid surgery; Bladder surgery; Cataract extraction (Bilateral); Trigger finger release (Left, 05/12/2016); Hardware Removal (Left, 01/12/2017); Reverse shoulder arthroplasty (Left, 02/22/2018); Reverse shoulder arthroplasty (Left, 02/22/2018); and Transcarotid artery revascularization (Left, 01/17/2020).  Brief Summary:  [name] is a 76 y.o. female with obstructive sleep apnea, COPD with asthma, and sarcoidosis.      Subjective:   She is doing well with CPAP.  No issues with mask fit.  She hasn't received new supplies for a while.  She received several bills from her DME, but isn't sure what these are for.  Has cough with clear sputum.  Not having fever, wheeze, chest pain, or hemoptysis.  Trelegy helps.  Needs a new hearing aide.  Physical Exam:   Appearance - well kempt   ENMT - no sinus tenderness, no oral exudate, no LAN, Mallampati 3 airway, no stridor  Respiratory - equal breath sounds bilaterally, no wheezing or rales  CV - s1s2 regular rate and rhythm, no murmurs  Ext - no clubbing, no edema  Skin - no rashes  Psych - normal mood and affect    Pulmonary testing:  Lymph node bx 04/27/01 >> non-caseating granuloma Spirometry 08/06/18 >> FEV1 0.79 (42%), FEV1% 65 PFT 11/02/18 >> FEV1 1.03 (53%), FEV1% 65, TLC 3.45 (76%), DLCO 60%, +  BD Spirometry 10/27/21 >> FEV1 1.03 (57%), FEV1% 57  Chest Imaging:  HRCT chest 09/28/18 >> atherosclerosis, 6 mm nodule RML, 10 mm nodule LUL both stable since 2016; calcified granulomas, mild patchy air trapping, moderate varicoid BTX, mild subpleural reticulation CT chest 10/16/20 >> no significant change  Sleep Tests:  HST 09/27/18 >> AHI 32.4, SpO2 low 76%. Auto CPAP 11/27/21 to 12/26/21 >> used on 23 of 30 nights with average 5 hrs 26 min.  Average AHI 5.5 with median CPAP 8 and 95 th percentile CPAP 12 cm H2O  Cardiac Tests:  Echo 07/02/19 >> EF 60 to 65%, mild/mod MR, mild AS  Social History:  She  reports that she has never smoked. She has never used smokeless tobacco. She reports that she does not drink alcohol and does not use drugs.  Family History:  Her family history includes Diabetes in her maternal grandmother and mother; Esophageal cancer in her father.     Assessment/Plan:   Obstructive sleep apnea. - she is compliant with CPAP and reports benefit - she uses [employer] for her DME - continue auto CPAP 5 to 15 cm H2O - will make sure she gets new CPAP mask and supplies - advised her to contact her insurance provider and her DME to clarify issues with billing statements she received for her equipment   COPD with asthma. - followed by Dr. Neldon Mc with [facility] and remains on xolair injection - continue trelegy, singulair  History of pulmonary sarcoidosis with bronchiectasis. - CT chest from November 2021 showed stable findings - monitor clinically  Rheumatic valve disease, hypertension. - followed by Dr. Geraldo Pitter with [facility]  Time Spent Involved in Patient Care on Day of Examination:  27 minutes  Follow up:   Patient Instructions  Follow up in 1 year  Medication List:   Allergies as of 12/28/2021       Reactions   Codeine Nausea And Vomiting   Sulfa Antibiotics Other (See Comments)   Allergic per pt's mother          Medication List        Accurate as of December 28, 2021 11:42 AM. If you have any questions, ask your nurse or doctor.          acetaminophen 325 MG tablet Commonly known as: TYLENOL Take 2 tablets (650 mg total) by mouth every 6 (six) hours as needed for mild pain (or Fever >/= 101).   albuterol 108 (90 Base) MCG/ACT inhaler Commonly known as: VENTOLIN HFA Inhale 2 puffs into the lungs every 6 (six) hours as needed for wheezing or shortness of breath.   amLODipine 5 MG tablet Commonly known as: NORVASC Take 5 mg by mouth daily.   aspirin EC 81 MG tablet Take 81 mg by mouth daily.   atorvastatin 40 MG tablet Commonly known as: LIPITOR Take 40 mg by mouth at bedtime.   azelastine 0.1 % nasal spray Commonly known as: ASTELIN Place 2 sprays into both nostrils 2 (two) times daily.   citalopram 20 MG tablet Commonly known as: CELEXA Take 20 mg by mouth daily.   dicyclomine 10 MG capsule Commonly known as: BENTYL Take 10 mg by mouth 4 (four) times daily.   EPINEPHrine 0.3 mg/0.3 mL Soaj injection Commonly known as: EPI-PEN Use as directed for life-threatening allergic reaction.   famotidine 40 MG tablet Commonly known as: PEPCID TAKE ONE TABLET BY MOUTH EVERYDAY AT BEDTIME   fluticasone 50 MCG/ACT nasal spray Commonly known as: FLONASE Place 1-2 sprays into both nostrils as needed for allergies or rhinitis.   furosemide 20 MG tablet Commonly known as: LASIX Take 20 mg by mouth 2 (two) times daily.   ipratropium 0.06 % nasal spray Commonly known as: ATROVENT Place 1-2 sprays into both nostrils 4 (four) times daily.   ipratropium-albuterol 0.5-2.5 (3) MG/3ML Soln Commonly known as: DUONEB Take 3 mLs by nebulization every 6 (six) hours as needed for wheezing or shortness of breath (shortness of breath/wheezing).   levocetirizine 5 MG tablet Commonly known as: XYZAL Take 5 mg by mouth at bedtime.   losartan 100 MG tablet Commonly known as: COZAAR Take 100  mg by mouth at bedtime.   montelukast 10 MG tablet Commonly known as: SINGULAIR Take 10 mg by mouth daily.   omalizumab 150 MG/ML prefilled syringe Commonly known as: Xolair Inject 150 mg into the skin every 28 (twenty-eight) days.   omeprazole 40 MG capsule Commonly known as: PRILOSEC Take 40 mg by mouth 2 (two) times daily.   Ozempic (0.25 or 0.5 MG/DOSE) 2 MG/1.5ML Sopn Generic drug: Semaglutide(0.25 or 0.5MG /DOS) Inject 0.25 mg into the skin every 7 (seven) days.   pioglitazone 30 MG tablet Commonly known as: ACTOS Take 30 mg by mouth daily.   PROBIOTIC DAILY PO Take 1 capsule by mouth daily.   tolterodine 4 MG 24 hr capsule Commonly known as: DETROL LA Take 4 mg by mouth at bedtime.   Trelegy Ellipta 200-62.5-25  MCG/ACT Aepb Generic drug: Fluticasone-Umeclidin-Vilant Inhale 1 puff into the lungs every morning.        Signature:  Chesley Mires, MD [facility] Pager - [phone] 12/28/2021, 11:42 AM

## 2021-12-28 NOTE — Patient Instructions (Signed)
Follow up in 1 year.

## 2022-01-11 DIAGNOSIS — E119 Type 2 diabetes mellitus without complications: Secondary | ICD-10-CM | POA: Diagnosis not present

## 2022-01-19 ENCOUNTER — Other Ambulatory Visit: Payer: Self-pay

## 2022-01-19 ENCOUNTER — Ambulatory Visit: Payer: Medicare HMO | Admitting: Allergy and Immunology

## 2022-01-19 ENCOUNTER — Ambulatory Visit (INDEPENDENT_AMBULATORY_CARE_PROVIDER_SITE_OTHER): Payer: Medicare HMO | Admitting: *Deleted

## 2022-01-19 DIAGNOSIS — J4550 Severe persistent asthma, uncomplicated: Secondary | ICD-10-CM

## 2022-01-24 DIAGNOSIS — R252 Cramp and spasm: Secondary | ICD-10-CM | POA: Diagnosis not present

## 2022-01-24 DIAGNOSIS — J329 Chronic sinusitis, unspecified: Secondary | ICD-10-CM | POA: Diagnosis not present

## 2022-01-24 DIAGNOSIS — J40 Bronchitis, not specified as acute or chronic: Secondary | ICD-10-CM | POA: Diagnosis not present

## 2022-01-24 DIAGNOSIS — J45901 Unspecified asthma with (acute) exacerbation: Secondary | ICD-10-CM | POA: Diagnosis not present

## 2022-01-24 DIAGNOSIS — Z6830 Body mass index (BMI) 30.0-30.9, adult: Secondary | ICD-10-CM | POA: Diagnosis not present

## 2022-01-24 DIAGNOSIS — E1122 Type 2 diabetes mellitus with diabetic chronic kidney disease: Secondary | ICD-10-CM | POA: Diagnosis not present

## 2022-01-24 DIAGNOSIS — N1831 Chronic kidney disease, stage 3a: Secondary | ICD-10-CM | POA: Diagnosis not present

## 2022-01-25 DIAGNOSIS — E119 Type 2 diabetes mellitus without complications: Secondary | ICD-10-CM | POA: Diagnosis not present

## 2022-01-25 DIAGNOSIS — J4540 Moderate persistent asthma, uncomplicated: Secondary | ICD-10-CM | POA: Diagnosis not present

## 2022-02-01 ENCOUNTER — Other Ambulatory Visit: Payer: Self-pay | Admitting: Allergy and Immunology

## 2022-02-01 DIAGNOSIS — J329 Chronic sinusitis, unspecified: Secondary | ICD-10-CM | POA: Diagnosis not present

## 2022-02-01 DIAGNOSIS — J40 Bronchitis, not specified as acute or chronic: Secondary | ICD-10-CM | POA: Diagnosis not present

## 2022-02-01 DIAGNOSIS — D692 Other nonthrombocytopenic purpura: Secondary | ICD-10-CM | POA: Diagnosis not present

## 2022-02-01 DIAGNOSIS — E1122 Type 2 diabetes mellitus with diabetic chronic kidney disease: Secondary | ICD-10-CM | POA: Diagnosis not present

## 2022-02-01 DIAGNOSIS — Z6829 Body mass index (BMI) 29.0-29.9, adult: Secondary | ICD-10-CM | POA: Diagnosis not present

## 2022-02-01 DIAGNOSIS — D860 Sarcoidosis of lung: Secondary | ICD-10-CM | POA: Diagnosis not present

## 2022-02-01 DIAGNOSIS — J301 Allergic rhinitis due to pollen: Secondary | ICD-10-CM | POA: Diagnosis not present

## 2022-02-10 ENCOUNTER — Ambulatory Visit (INDEPENDENT_AMBULATORY_CARE_PROVIDER_SITE_OTHER): Payer: Medicare HMO | Admitting: Podiatry

## 2022-02-10 ENCOUNTER — Other Ambulatory Visit: Payer: Self-pay

## 2022-02-10 ENCOUNTER — Encounter: Payer: Self-pay | Admitting: Podiatry

## 2022-02-10 DIAGNOSIS — M79674 Pain in right toe(s): Secondary | ICD-10-CM | POA: Diagnosis not present

## 2022-02-10 DIAGNOSIS — M2042 Other hammer toe(s) (acquired), left foot: Secondary | ICD-10-CM | POA: Diagnosis not present

## 2022-02-10 DIAGNOSIS — E119 Type 2 diabetes mellitus without complications: Secondary | ICD-10-CM | POA: Diagnosis not present

## 2022-02-10 DIAGNOSIS — B351 Tinea unguium: Secondary | ICD-10-CM | POA: Diagnosis not present

## 2022-02-10 DIAGNOSIS — M79675 Pain in left toe(s): Secondary | ICD-10-CM | POA: Diagnosis not present

## 2022-02-10 DIAGNOSIS — M2041 Other hammer toe(s) (acquired), right foot: Secondary | ICD-10-CM

## 2022-02-16 ENCOUNTER — Ambulatory Visit (INDEPENDENT_AMBULATORY_CARE_PROVIDER_SITE_OTHER): Payer: Medicare HMO | Admitting: *Deleted

## 2022-02-16 ENCOUNTER — Other Ambulatory Visit: Payer: Self-pay

## 2022-02-16 DIAGNOSIS — J4550 Severe persistent asthma, uncomplicated: Secondary | ICD-10-CM | POA: Diagnosis not present

## 2022-02-18 NOTE — Progress Notes (Signed)
ANNUAL DIABETIC FOOT EXAM ? ?Subjective: ?[name] presents today for annual diabetic foot examination. ? ?Patient relates 10 year h/o diabetes. ? ?Patient denies any h/o foot wounds. ? ?Patient admits symptoms of numbness, tingling, burning in feet on occasion.  ? ?Patient's blood sugar was 114 mg/dl today. Last HgA1c was 5.8%. ? ?Risk factors: diabetes, h/o TIA, HTN, dyslipidemia, RA. ? ?Serita Grammes, MD is patient's PCP. Last visit was November 26, 2021\. ? ?Past Medical History:  ?Diagnosis Date  ? ABDOMINAL PAIN RIGHT LOWER QUADRANT 03/03/2010  ? Qualifier: Diagnosis of  By: Olevia Perches MD, Lowella Bandy   ? Acquired trigger finger 04/20/2016  ? Age-related osteoporosis without current pathological fracture 01/23/2017  ? Allergy   ? Anxiety   ? Arthritis   ? Asthma   ? Bilateral carotid artery stenosis 01/16/2020  ? Bilateral primary osteoarthritis of knee 10/16/2018  ? Cataract   ? bilateral and removed  ? Chest pain 11/01/2013  ? Closed fracture of distal end of radius 06/05/2016  ? DDD (degenerative disc disease), lumbar 01/23/2017  ? Depression   ? Diabetes mellitus type II, non insulin dependent ([place]) 07/05/2019  ? Diaphragmatic hernia without mention of obstruction or gangrene   ? DM 03/03/2010  ? Qualifier: Diagnosis of  By: Tamala Julian CMA, Claiborne Billings    ? DM (diabetes mellitus) ([place])   ? DOE (dyspnea on exertion) 05/06/2019  ? Dyslipidemia 01/23/2017  ? Dyspnea   ? Esophageal reflux   ? Esophagitis, unspecified   ? Essential hypertension 05/02/2008  ? Qualifier: Diagnosis of  By: Nelson-Smith CMA (AAMA), Dottie    ? Full incontinence of feces 03/03/2010  ? Qualifier: Diagnosis of  By: Olevia Perches MD, Lowella Bandy   ? H/O adenomatous polyp of colon 2011  ? H/O allergic rhinitis 07/29/2015  ? Hearing loss, mixed, bilateral 12/17/2018  ? Heart murmur   ? no cardiologist, family Dr. Maryfrances Bunnell  ? History of asthma 01/23/2017  ? History of depression 01/23/2017  ? History of diabetes mellitus 01/23/2017  ? History of gastroesophageal reflux (GERD)  01/17/2017  ? History of kidney stones   ? History of transient ischemic attack (TIA) 01/16/2020  ? Hyperlipidemia   ? Incontinence of feces   ? Irritable bowel syndrome   ? Kidney stones   ? LPRD (laryngopharyngeal reflux disease) 07/29/2015  ? Mild aortic stenosis 07/05/2019  ? Mitral regurgitation 07/05/2019  ? Murmur 11/01/2013  ? Normocytic anemia 01/16/2020  ? Obstructive lung disease ([place]) 07/29/2015  ? OSA (obstructive sleep apnea) 09/28/2018  ? Pain in left wrist 06/06/2016  ? Pedal edema 05/06/2019  ? Primary osteoarthritis of both feet 01/17/2017  ? Primary osteoarthritis of both hands 12/13/2016  ? Primary osteoarthritis of both knees 01/17/2017  ? Primary osteoarthritis, left shoulder 12/11/2017  ? Retained orthopedic hardware 11/14/2016  ? Rheumatoid factor positive 12/13/2016  ? Sarcoidosis   ? Severe persistent asthma 11/16/2007  ? Annotation: chronic Qualifier: Diagnosis of  By: Melvyn Novas MD, Christena Deem   ? Shoulder arthritis 02/22/2018  ? Sleep apnea   ? no cpap now  ? Status post dilation of esophageal narrowing   ? Stiffness of left wrist joint 06/06/2016  ? Unspecified hearing loss   ? Vitamin D deficiency 01/17/2017  ? ?Patient Active Problem List  ? Diagnosis Date Noted  ? Allergy   ? Anxiety   ? Arthritis   ? Cataract   ? Depression   ? DM (diabetes mellitus) ([place])   ? Dyspnea   ? Heart  murmur   ? History of kidney stones   ? Hyperlipidemia   ? Kidney stones   ? Sleep apnea   ? Status post dilation of esophageal narrowing   ? Asthma 01/16/2020  ? History of transient ischemic attack (TIA) 01/16/2020  ? Bilateral carotid artery stenosis 01/16/2020  ? Normocytic anemia 01/16/2020  ? Diabetes mellitus type II, non insulin dependent ([place]) 07/05/2019  ? Mild aortic stenosis 07/05/2019  ? Mitral regurgitation 07/05/2019  ? Pedal edema 05/06/2019  ? DOE (dyspnea on exertion) 05/06/2019  ? Hearing loss, mixed, bilateral 12/17/2018  ? Bilateral primary osteoarthritis of knee 10/16/2018  ? OSA (obstructive sleep apnea) 09/28/2018   ? Shoulder arthritis 02/22/2018  ? Primary osteoarthritis, left shoulder 12/11/2017  ? DDD (degenerative disc disease), lumbar 01/23/2017  ? History of diabetes mellitus 01/23/2017  ? History of depression 01/23/2017  ? History of asthma 01/23/2017  ? Dyslipidemia 01/23/2017  ? Age-related osteoporosis without current pathological fracture 01/23/2017  ? History of gastroesophageal reflux (GERD) 01/17/2017  ? Primary osteoarthritis of both knees 01/17/2017  ? Primary osteoarthritis of both feet 01/17/2017  ? Vitamin D deficiency 01/17/2017  ? Rheumatoid factor positive 12/13/2016  ? Primary osteoarthritis of both hands 12/13/2016  ? Retained orthopedic hardware 11/14/2016  ? Pain in left wrist 06/06/2016  ? Stiffness of left wrist joint 06/06/2016  ? Closed fracture of distal end of radius 06/05/2016  ? Acquired trigger finger 04/20/2016  ? Obstructive lung disease ([place]) 07/29/2015  ? H/O allergic rhinitis 07/29/2015  ? LPRD (laryngopharyngeal reflux disease) 07/29/2015  ? Chest pain 11/01/2013  ? Murmur 11/01/2013  ? DM 03/03/2010  ? ABDOMINAL PAIN RIGHT LOWER QUADRANT 03/03/2010  ? FULL INCONTINENCE OF FECES 03/03/2010  ? H/O adenomatous polyp of colon 2011  ? DECREASED HEARING 05/02/2008  ? Essential hypertension 05/02/2008  ? ESOPHAGITIS 05/02/2008  ? HIATAL HERNIA 05/02/2008  ? RECTAL INCONTINENCE 05/02/2008  ? SARCOIDOSIS 11/16/2007  ? Severe persistent asthma 11/16/2007  ? GERD 11/16/2007  ? IRRITABLE BOWEL SYNDROME 11/16/2007  ? ?Past Surgical History:  ?Procedure Laterality Date  ? BLADDER SURGERY    ? CATARACT EXTRACTION Bilateral   ? HARDWARE REMOVAL Left 01/12/2017  ? Procedure: HARDWARE REMOVAL left wrist;  Surgeon: Leanora Cover, MD;  Location: [facility];  Service: Orthopedics;  Laterality: Left;  ? HEMORRHOID SURGERY    ? NASAL SEPTUM SURGERY    ? PARTIAL HYSTERECTOMY    ? REVERSE SHOULDER ARTHROPLASTY Left 02/22/2018  ? REVERSE SHOULDER ARTHROPLASTY Left 02/22/2018  ? Procedure: LEFT  REVERSE SHOULDER ARTHROPLASTY;  Surgeon: Meredith Pel, MD;  Location: [facility];  Service: Orthopedics;  Laterality: Left;  ? TONSILLECTOMY AND ADENOIDECTOMY    ? TRANSCAROTID ARTERY REVASCULARIZATION?  Left 01/17/2020  ? Procedure: TRANSCAROTID ARTERY REVASCULARIZATION;  Surgeon: Serafina Mitchell, MD;  [facility];  Service: Vascular;  Laterality: Left;  ? TRIGGER FINGER RELEASE Left 05/12/2016  ? Procedure: RELEASE TRIGGER FINGER/A-1 PULLEY INFECTION LEFT RING TENDON SHEATH INJECT RIGHT INDEX FINGER;  Surgeon: Leanora Cover, MD;  Location: [facility];  Service: Orthopedics;  Laterality: Left;  ? TUBAL LIGATION    ? ?Current Outpatient Medications on File Prior to Visit  ?Medication Sig Dispense Refill  ? alendronate (FOSAMAX) 70 MG tablet     ? amLODipine (NORVASC) 5 MG tablet Take 1 tablet by mouth daily.    ? telmisartan (MICARDIS) 80 MG tablet Take 1 tablet by mouth daily.    ? acetaminophen (TYLENOL) 325 MG tablet  Take 2 tablets (650 mg total) by mouth every 6 (six) hours as needed for mild pain (or Fever >/= 101).    ? albuterol (VENTOLIN HFA) 108 (90 Base) MCG/ACT inhaler Inhale 2 puffs into the lungs every 6 (six) hours as needed for wheezing or shortness of breath.    ? aspirin 81 MG EC tablet Take 1 tablet by mouth daily.    ? atorvastatin (LIPITOR) 40 MG tablet Take 40 mg by mouth at bedtime.    ? azelastine (ASTELIN) 0.1 % nasal spray Place 2 sprays into both nostrils 2 (two) times daily.    ? Cholecalciferol 25 MCG (1000 UT) tablet Take by mouth.    ? citalopram (CELEXA) 20 MG tablet Take 20 mg by mouth daily.    ? diclofenac (VOLTAREN) 25 MG EC tablet Take 25 mg by mouth daily as needed.    ? dicyclomine (BENTYL) 20 MG tablet Take by mouth.    ? EPINEPHrine 0.3 mg/0.3 mL IJ SOAJ injection Use as directed for life-threatening allergic reaction. 1 each 3  ? famotidine (PEPCID) 40 MG tablet TAKE ONE TABLET BY MOUTH EVERYDAY AT BEDTIME 30 tablet 1  ? fluticasone (FLONASE) 50 MCG/ACT  nasal spray Place 1-2 sprays into both nostrils as needed for allergies or rhinitis.    ? Fluticasone-Umeclidin-Vilant (TRELEGY ELLIPTA) 200-62.5-25 MCG/INH AEPB Inhale 1 puff into the lungs every morning. 1 eac

## 2022-02-22 DIAGNOSIS — D638 Anemia in other chronic diseases classified elsewhere: Secondary | ICD-10-CM | POA: Diagnosis not present

## 2022-02-22 DIAGNOSIS — D860 Sarcoidosis of lung: Secondary | ICD-10-CM | POA: Diagnosis not present

## 2022-02-22 DIAGNOSIS — Z0000 Encounter for general adult medical examination without abnormal findings: Secondary | ICD-10-CM | POA: Diagnosis not present

## 2022-02-22 DIAGNOSIS — E1122 Type 2 diabetes mellitus with diabetic chronic kidney disease: Secondary | ICD-10-CM | POA: Diagnosis not present

## 2022-02-22 DIAGNOSIS — Z6830 Body mass index (BMI) 30.0-30.9, adult: Secondary | ICD-10-CM | POA: Diagnosis not present

## 2022-02-22 DIAGNOSIS — J301 Allergic rhinitis due to pollen: Secondary | ICD-10-CM | POA: Diagnosis not present

## 2022-02-22 DIAGNOSIS — D692 Other nonthrombocytopenic purpura: Secondary | ICD-10-CM | POA: Diagnosis not present

## 2022-02-22 DIAGNOSIS — Z13220 Encounter for screening for lipoid disorders: Secondary | ICD-10-CM | POA: Diagnosis not present

## 2022-02-23 DIAGNOSIS — G4733 Obstructive sleep apnea (adult) (pediatric): Secondary | ICD-10-CM | POA: Diagnosis not present

## 2022-02-25 DIAGNOSIS — E119 Type 2 diabetes mellitus without complications: Secondary | ICD-10-CM | POA: Diagnosis not present

## 2022-02-25 DIAGNOSIS — J4540 Moderate persistent asthma, uncomplicated: Secondary | ICD-10-CM | POA: Diagnosis not present

## 2022-03-01 DIAGNOSIS — F419 Anxiety disorder, unspecified: Secondary | ICD-10-CM | POA: Diagnosis not present

## 2022-03-01 DIAGNOSIS — I25119 Atherosclerotic heart disease of native coronary artery with unspecified angina pectoris: Secondary | ICD-10-CM | POA: Diagnosis not present

## 2022-03-01 DIAGNOSIS — E1122 Type 2 diabetes mellitus with diabetic chronic kidney disease: Secondary | ICD-10-CM | POA: Diagnosis not present

## 2022-03-01 DIAGNOSIS — E1159 Type 2 diabetes mellitus with other circulatory complications: Secondary | ICD-10-CM | POA: Diagnosis not present

## 2022-03-01 DIAGNOSIS — E669 Obesity, unspecified: Secondary | ICD-10-CM | POA: Diagnosis not present

## 2022-03-01 DIAGNOSIS — G4733 Obstructive sleep apnea (adult) (pediatric): Secondary | ICD-10-CM | POA: Diagnosis not present

## 2022-03-01 DIAGNOSIS — E1151 Type 2 diabetes mellitus with diabetic peripheral angiopathy without gangrene: Secondary | ICD-10-CM | POA: Diagnosis not present

## 2022-03-01 DIAGNOSIS — J45909 Unspecified asthma, uncomplicated: Secondary | ICD-10-CM | POA: Diagnosis not present

## 2022-03-01 DIAGNOSIS — E785 Hyperlipidemia, unspecified: Secondary | ICD-10-CM | POA: Diagnosis not present

## 2022-03-01 DIAGNOSIS — R69 Illness, unspecified: Secondary | ICD-10-CM | POA: Diagnosis not present

## 2022-03-01 DIAGNOSIS — M810 Age-related osteoporosis without current pathological fracture: Secondary | ICD-10-CM | POA: Diagnosis not present

## 2022-03-01 DIAGNOSIS — I10 Essential (primary) hypertension: Secondary | ICD-10-CM | POA: Diagnosis not present

## 2022-03-11 ENCOUNTER — Ambulatory Visit (INDEPENDENT_AMBULATORY_CARE_PROVIDER_SITE_OTHER): Payer: Medicare HMO

## 2022-03-11 ENCOUNTER — Ambulatory Visit (INDEPENDENT_AMBULATORY_CARE_PROVIDER_SITE_OTHER): Payer: Medicare HMO | Admitting: Surgical

## 2022-03-11 DIAGNOSIS — M25572 Pain in left ankle and joints of left foot: Secondary | ICD-10-CM

## 2022-03-11 DIAGNOSIS — M1711 Unilateral primary osteoarthritis, right knee: Secondary | ICD-10-CM | POA: Diagnosis not present

## 2022-03-11 DIAGNOSIS — M1712 Unilateral primary osteoarthritis, left knee: Secondary | ICD-10-CM

## 2022-03-11 DIAGNOSIS — M25562 Pain in left knee: Secondary | ICD-10-CM

## 2022-03-11 DIAGNOSIS — M170 Bilateral primary osteoarthritis of knee: Secondary | ICD-10-CM | POA: Diagnosis not present

## 2022-03-12 ENCOUNTER — Encounter: Payer: Self-pay | Admitting: Orthopedic Surgery

## 2022-03-12 MED ORDER — METHYLPREDNISOLONE ACETATE 40 MG/ML IJ SUSP
40.0000 mg | INTRAMUSCULAR | Status: AC | PRN
  Administered 2022-03-11: 40 mg via INTRA_ARTICULAR

## 2022-03-12 MED ORDER — BUPIVACAINE HCL 0.25 % IJ SOLN
4.0000 mL | INTRAMUSCULAR | Status: AC | PRN
  Administered 2022-03-11: 4 mL via INTRA_ARTICULAR

## 2022-03-12 MED ORDER — LIDOCAINE HCL 1 % IJ SOLN
5.0000 mL | INTRAMUSCULAR | Status: AC | PRN
  Administered 2022-03-11: 5 mL

## 2022-03-12 NOTE — Progress Notes (Signed)
? ?Office Visit Note ?  ?Patient: [name]           ?Date of Birth: 03/16/1946           ?MRN: [id number] ?Visit Date: 03/11/2022 ?Requested by: Serita Grammes, MD ?[address] ?[place],  [place] [address] ?PCP: Serita Grammes, MD ? ?Subjective: ?Chief Complaint  ?Patient presents with  ? Right Leg - Pain  ? Left Leg - Pain  ? Right Knee - Pain  ? Left Knee - Pain  ? Left Ankle - Pain  ? Lower Back - Pain  ? ? ?HPI: NYRAH DEMOS is a 76 y.o. female who presents to the office complaining of multiple joint complaints.  She complains primarily of bilateral knee pain that wakes her up quite often.  No new injuries.  She has history of bilateral knee osteoarthritis.  Localizes the vast majority of her pain to the medial aspect of both knees with some radiation of pain down her shins.  She notes left ankle pain and low back pain as well.  This back pain will bother her in her superior buttock region bilaterally but does not radiate down the entirety of her leg except for very sparingly.  She denies any numbness or tingling in her legs but does note numbness and tingling in her feet at times.  She has history of diabetes with last A1c 5.6.  She has noted worst pain in the last 2 to 3 months.  She had previous knee injection on 09/01/2021 that gave her 3 to 4 months of relief.  She is taking Tylenol without relief currently.  Denies any groin pain ?             ?ROS: All systems reviewed are negative as they relate to the chief complaint within the history of present illness.  Patient denies fevers or chills. ? ?Assessment & Plan: ?Visit Diagnoses:  ?1. Pain in left ankle and joints of left foot   ?2. Left knee pain, unspecified chronicity   ?3. Primary osteoarthritis of right knee   ?4. Unilateral primary osteoarthritis, left knee   ? ? ?Plan: Patient is a 76 year old female who presents complaint of multiple joint complaints.  Primarily she is complaining of bilateral knee pain.  She has history of  bilateral knee osteoarthritis.  She had previous injection on 09/01/2021 with about 3 months relief but now pain has returned.  Discussed options available to patient and she would like to try bilateral knee cortisone injections today as she has a planned trip to the [place] in 2 weeks and she would like to be able to walk without severe pain.  Bilateral knee cortisone injections administered today and patient tolerated the procedure well.  Plan to have patient follow-up in 4 weeks for clinical recheck.  This will be 2 weeks after her trip and can determine if any further intervention is needed regarding the back/buttock pain and the left ankle pain.  Left ankle radiographs are overall unremarkable today. ? ?Follow-Up Instructions: No follow-ups on file.  ? ?Orders:  ?Orders Placed This Encounter  ?Procedures  ? XR Ankle Complete Left  ? XR Knee 1-2 Views Left  ? ?No orders of the defined types were placed in this encounter. ? ? ? ? Procedures: ?Large Joint Inj: bilateral knee on 03/11/2022 5:55 PM ?Indications: diagnostic evaluation, joint swelling and pain ?Details: 18 G 1.5 in needle, superolateral approach ? ?Arthrogram: No ? ?Medications (Right): 5 mL lidocaine 1 %; 4  mL bupivacaine 0.25 %; 40 mg methylPREDNISolone acetate 40 MG/ML ?Medications (Left): 5 mL lidocaine 1 %; 4 mL bupivacaine 0.25 %; 40 mg methylPREDNISolone acetate 40 MG/ML ?Outcome: tolerated well, no immediate complications ?Procedure, treatment alternatives, risks and benefits explained, specific risks discussed. Consent was given by the patient. Immediately prior to procedure a time out was called to verify the correct patient, procedure, equipment, support staff and site/side marked as required. Patient was prepped and draped in the usual sterile fashion.  ? ? ? ? ?Clinical Data: ?No additional findings. ? ?Objective: ?Vital Signs: There were no vitals taken for this visit. ? ?Physical Exam:  ?Constitutional: Patient appears  well-developed ?HEENT:  ?Head: Normocephalic ?Eyes:EOM are normal ?Neck: Normal range of motion ?Cardiovascular: Normal rate ?Pulmonary/chest: Effort normal ?Neurologic: Patient is alert ?Skin: Skin is warm ?Psychiatric: Patient has normal mood and affect ? ?Ortho Exam: Ortho exam demonstrates bilateral knees with 5 to 10 degree flexion contracture.  No effusion noted on either knee.  Severe medial joint line tenderness of both knees with moderate lateral joint line tenderness.  No significant calf tenderness but she does have some tenderness down her shins diffusely.  She has tenderness over the ankle joint line anteriorly with no focal tenderness over the medial or lateral malleoli.  Intact ankle dorsiflexion, plantarflexion, inversion, eversion.  No tenderness over the Achilles tendon.  Achilles tendon insertion is palpable and intact.  Negative straight leg raise.  Negative Stinchfield sign.  No pain with hip range of motion.  She is able to perform straight leg raise by herself.  5/5 motor strength of bilateral hip flexion, quadricep, hamstring, dorsiflexion, plantarflexion. ? ?Specialty Comments:  ?No specialty comments available. ? ?Imaging: ?No results found. ? ? ?PMFS History: ?Patient Active Problem List  ? Diagnosis Date Noted  ? Allergy   ? Anxiety   ? Arthritis   ? Cataract   ? Depression   ? DM (diabetes mellitus) ([place])   ? Dyspnea   ? Heart murmur   ? History of kidney stones   ? Hyperlipidemia   ? Kidney stones   ? Sleep apnea   ? Status post dilation of esophageal narrowing   ? Asthma 01/16/2020  ? History of transient ischemic attack (TIA) 01/16/2020  ? Bilateral carotid artery stenosis 01/16/2020  ? Normocytic anemia 01/16/2020  ? Diabetes mellitus type II, non insulin dependent ([place]) 07/05/2019  ? Mild aortic stenosis 07/05/2019  ? Mitral regurgitation 07/05/2019  ? Pedal edema 05/06/2019  ? DOE (dyspnea on exertion) 05/06/2019  ? Hearing loss, mixed, bilateral 12/17/2018  ? Bilateral primary  osteoarthritis of knee 10/16/2018  ? OSA (obstructive sleep apnea) 09/28/2018  ? Shoulder arthritis 02/22/2018  ? Primary osteoarthritis, left shoulder 12/11/2017  ? DDD (degenerative disc disease), lumbar 01/23/2017  ? History of diabetes mellitus 01/23/2017  ? History of depression 01/23/2017  ? History of asthma 01/23/2017  ? Dyslipidemia 01/23/2017  ? Age-related osteoporosis without current pathological fracture 01/23/2017  ? History of gastroesophageal reflux (GERD) 01/17/2017  ? Primary osteoarthritis of both knees 01/17/2017  ? Primary osteoarthritis of both feet 01/17/2017  ? Vitamin D deficiency 01/17/2017  ? Rheumatoid factor positive 12/13/2016  ? Primary osteoarthritis of both hands 12/13/2016  ? Retained orthopedic hardware 11/14/2016  ? Pain in left wrist 06/06/2016  ? Stiffness of left wrist joint 06/06/2016  ? Closed fracture of distal end of radius 06/05/2016  ? Acquired trigger finger 04/20/2016  ? Obstructive lung disease ([place]) 07/29/2015  ? H/O allergic rhinitis  07/29/2015  ? LPRD (laryngopharyngeal reflux disease) 07/29/2015  ? Chest pain 11/01/2013  ? Murmur 11/01/2013  ? DM 03/03/2010  ? ABDOMINAL PAIN RIGHT LOWER QUADRANT 03/03/2010  ? FULL INCONTINENCE OF FECES 03/03/2010  ? H/O adenomatous polyp of colon 2011  ? DECREASED HEARING 05/02/2008  ? Essential hypertension 05/02/2008  ? ESOPHAGITIS 05/02/2008  ? HIATAL HERNIA 05/02/2008  ? RECTAL INCONTINENCE 05/02/2008  ? SARCOIDOSIS 11/16/2007  ? Severe persistent asthma 11/16/2007  ? GERD 11/16/2007  ? IRRITABLE BOWEL SYNDROME 11/16/2007  ? ?Past Medical History:  ?Diagnosis Date  ? ABDOMINAL PAIN RIGHT LOWER QUADRANT 03/03/2010  ? Qualifier: Diagnosis of  By: Olevia Perches MD, Lowella Bandy   ? Acquired trigger finger 04/20/2016  ? Age-related osteoporosis without current pathological fracture 01/23/2017  ? Allergy   ? Anxiety   ? Arthritis   ? Asthma   ? Bilateral carotid artery stenosis 01/16/2020  ? Bilateral primary osteoarthritis of knee 10/16/2018  ?  Cataract   ? bilateral and removed  ? Chest pain 11/01/2013  ? Closed fracture of distal end of radius 06/05/2016  ? DDD (degenerative disc disease), lumbar 01/23/2017  ? Depression   ? Diabetes mellitus type II, non insulin dependen

## 2022-03-16 ENCOUNTER — Encounter: Payer: Self-pay | Admitting: Allergy and Immunology

## 2022-03-16 ENCOUNTER — Ambulatory Visit: Payer: Medicare HMO

## 2022-03-16 ENCOUNTER — Ambulatory Visit (INDEPENDENT_AMBULATORY_CARE_PROVIDER_SITE_OTHER): Payer: Medicare HMO | Admitting: Allergy and Immunology

## 2022-03-16 VITALS — BP 136/82 | HR 96 | Resp 16

## 2022-03-16 DIAGNOSIS — Z862 Personal history of diseases of the blood and blood-forming organs and certain disorders involving the immune mechanism: Secondary | ICD-10-CM

## 2022-03-16 DIAGNOSIS — K219 Gastro-esophageal reflux disease without esophagitis: Secondary | ICD-10-CM | POA: Diagnosis not present

## 2022-03-16 DIAGNOSIS — J3089 Other allergic rhinitis: Secondary | ICD-10-CM

## 2022-03-16 DIAGNOSIS — J4550 Severe persistent asthma, uncomplicated: Secondary | ICD-10-CM | POA: Diagnosis not present

## 2022-03-16 MED ORDER — TEZEPELUMAB-EKKO 210 MG/1.91ML ~~LOC~~ SOSY
210.0000 mg | PREFILLED_SYRINGE | SUBCUTANEOUS | Status: AC
  Administered 2022-03-16 – 2024-12-17 (×36): 210 mg via SUBCUTANEOUS

## 2022-03-16 NOTE — Patient Instructions (Addendum)
?  1. Start tezepelumab injections today. Replace Xolair injections  ? ?2. Continue Trelegy 200 - 1 inhalation 1 time per day ? ?3. Continue Flonase - one spray each nostril 1 time per day.   ? ?4. Continue omeprazole 40 twice a day + Famotidine 40 mg in evening ? ?5. If needed: ? ? A. nasal saline  ? B. ProAir HFA or Duoneb  ? C. OTC Mucinex DM 1-2 tablet 1-2 times per day  ? D. ipratropium 0.06% nasal spray - 1-2 sprays each nostril every 6 hours  ? E. Azelastine - 1 spray each nostril 1-2 times per day ? ?6. Return to clinic in 6 months or earlier if problem ? ?  ? ? ?  ?

## 2022-03-16 NOTE — Progress Notes (Signed)
? ?[facility] ? ? ?Follow-up Note ? ?Referring Provider: Serita Grammes, MD ?Primary Provider: Serita Grammes, MD ?Date of Office Visit: 03/16/2022 ? ?Subjective:  ? ?[name] (DOB: 26-Feb-1946) is a 76 y.o. female who returns to [facility] on 03/16/2022 in re-evaluation of the following: ? ?HPI: Aoife returns to this clinic in evaluation of asthma, fibrotic lung disease from sarcoidosis, allergic rhinitis, LPR.  I last saw her in this clinic on 20 October 2021. ? ?She tells me that she required the administration of systemic steroids in February for a "chest" issue with wheezing and coughing.  This apparently took several weeks to resolve.  Other than that event she has done relatively well but always has a little bit of limitation in ability to exercise because of a musculoskeletal issue and sometimes because of her breathing issue and she must use a short acting bronchodilator on a pretty common basis before going to sleep at nighttime.  This occurs even though she has been using her omalizumab injections and a triple inhaler. ? ?She believes that her nose is doing relatively well although she does develop this facial headache on occasion which has been a longstanding problem.  She has not really had any significant airway symptoms with her upper airways. ? ?Her reflux is under good control. ? ?She received a systemic steroid injection last week into her knees.  She had this injection performed as she anticipates going to the [facility] for a visit next week. ? ?Allergies as of 03/16/2022   ? ?   Reactions  ? Codeine Nausea And Vomiting  ? Sulfa Antibiotics Other (See Comments)  ? Allergic per pt's mother   ? ?  ? ?  ?Medication List  ? ? ?acetaminophen 325 MG tablet ?Commonly known as: TYLENOL ?Take 2 tablets (650 mg total) by mouth every 6 (six) hours as needed for mild pain (or Fever >/= 101). ?  ?albuterol 108 (90 Base)  MCG/ACT inhaler ?Commonly known as: VENTOLIN HFA ?Inhale 2 puffs into the lungs every 6 (six) hours as needed for wheezing or shortness of breath. ?  ?alendronate 70 MG tablet ?Commonly known as: FOSAMAX ?  ?amLODipine 5 MG tablet ?Commonly known as: NORVASC ?Take 1 tablet by mouth daily. ?  ?aspirin 81 MG EC tablet ?Take 1 tablet by mouth daily. ?  ?atorvastatin 40 MG tablet ?Commonly known as: LIPITOR ?Take 40 mg by mouth at bedtime. ?  ?azelastine 0.1 % nasal spray ?Commonly known as: ASTELIN ?Place 2 sprays into both nostrils 2 (two) times daily. ?  ?Cholecalciferol 25 MCG (1000 UT) tablet ?Take by mouth. ?  ?citalopram 20 MG tablet ?Commonly known as: CELEXA ?Take 20 mg by mouth daily. ?  ?diclofenac 25 MG EC tablet ?Commonly known as: VOLTAREN ?Take 25 mg by mouth daily as needed. ?  ?dicyclomine 20 MG tablet ?Commonly known as: BENTYL ?Take by mouth. ?  ?EPINEPHrine 0.3 mg/0.3 mL Soaj injection ?Commonly known as: EPI-PEN ?Use as directed for life-threatening allergic reaction. ?  ?famotidine 40 MG tablet ?Commonly known as: PEPCID ?TAKE ONE TABLET BY MOUTH EVERYDAY AT BEDTIME ?  ?fluticasone 50 MCG/ACT nasal spray ?Commonly known as: FLONASE ?Place 1-2 sprays into both nostrils as needed for allergies or rhinitis. ?  ?ipratropium 0.06 % nasal spray ?Commonly known as: ATROVENT ?Place into the nose. ?  ?ipratropium-albuterol 0.5-2.5 (3) MG/3ML Soln ?Commonly known as: DUONEB ?Take 3 mLs by nebulization every  6 (six) hours as needed for wheezing or shortness of breath (shortness of breath/wheezing). ?  ?levocetirizine 5 MG tablet ?Commonly known as: XYZAL ?Take 5 mg by mouth at bedtime. ?  ?losartan 100 MG tablet ?Commonly known as: COZAAR ?Take 100 mg by mouth at bedtime. ?  ?montelukast 10 MG tablet ?Commonly known as: SINGULAIR ?Take 10 mg by mouth daily. ?  ?omalizumab 150 MG/ML prefilled syringe ?Commonly known as: Xolair ?Inject 150 mg into the skin every 28 (twenty-eight) days. ?  ?omeprazole 40 MG  capsule ?Commonly known as: PRILOSEC ?Take 40 mg by mouth 2 (two) times daily. ?  ?OneTouch Verio test strip ?Generic drug: glucose blood ?daily. ?  ?Ozempic (0.25 or 0.5 MG/DOSE) 2 MG/1.5ML Sopn ?Generic drug: Semaglutide(0.25 or 0.'5MG'$ /DOS) ?Inject 0.25 mg into the skin every 7 (seven) days. ?  ?pioglitazone 30 MG tablet ?Commonly known as: ACTOS ?Take 30 mg by mouth daily. ?  ?PROBIOTIC DAILY PO ?Take 1 capsule by mouth daily. ?  ?telmisartan 80 MG tablet ?Commonly known as: MICARDIS ?Take 1 tablet by mouth daily. ?  ?tolterodine 4 MG 24 hr capsule ?Commonly known as: DETROL LA ?Take 4 mg by mouth at bedtime. ?  ?Trelegy Ellipta 200-62.5-25 MCG/ACT Aepb ?Generic drug: Fluticasone-Umeclidin-Vilant ?Inhale 1 puff into the lungs every morning. ?  ? ?Past Medical History:  ?Diagnosis Date  ? ABDOMINAL PAIN RIGHT LOWER QUADRANT 03/03/2010  ? Qualifier: Diagnosis of  By: Olevia Perches MD, Lowella Bandy   ? Acquired trigger finger 04/20/2016  ? Age-related osteoporosis without current pathological fracture 01/23/2017  ? Allergy   ? Anxiety   ? Arthritis   ? Asthma   ? Bilateral carotid artery stenosis 01/16/2020  ? Bilateral primary osteoarthritis of knee 10/16/2018  ? Cataract   ? bilateral and removed  ? Chest pain 11/01/2013  ? Closed fracture of distal end of radius 06/05/2016  ? DDD (degenerative disc disease), lumbar 01/23/2017  ? Depression   ? Diabetes mellitus type II, non insulin dependent ([place]) 07/05/2019  ? Diaphragmatic hernia without mention of obstruction or gangrene   ? DM 03/03/2010  ? Qualifier: Diagnosis of  By: Tamala Julian CMA, Claiborne Billings    ? DM (diabetes mellitus) ([place])   ? DOE (dyspnea on exertion) 05/06/2019  ? Dyslipidemia 01/23/2017  ? Dyspnea   ? Esophageal reflux   ? Esophagitis, unspecified   ? Essential hypertension 05/02/2008  ? Qualifier: Diagnosis of  By: Nelson-Smith CMA (AAMA), Dottie    ? Full incontinence of feces 03/03/2010  ? Qualifier: Diagnosis of  By: Olevia Perches MD, Lowella Bandy   ? H/O adenomatous polyp of colon 2011  ? H/O allergic  rhinitis 07/29/2015  ? Hearing loss, mixed, bilateral 12/17/2018  ? Heart murmur   ? no cardiologist, family Dr. Maryfrances Bunnell  ? History of asthma 01/23/2017  ? History of depression 01/23/2017  ? History of diabetes mellitus 01/23/2017  ? History of gastroesophageal reflux (GERD) 01/17/2017  ? History of kidney stones   ? History of transient ischemic attack (TIA) 01/16/2020  ? Hyperlipidemia   ? Incontinence of feces   ? Irritable bowel syndrome   ? Kidney stones   ? LPRD (laryngopharyngeal reflux disease) 07/29/2015  ? Mild aortic stenosis 07/05/2019  ? Mitral regurgitation 07/05/2019  ? Murmur 11/01/2013  ? Normocytic anemia 01/16/2020  ? Obstructive lung disease ([place]) 07/29/2015  ? OSA (obstructive sleep apnea) 09/28/2018  ? Pain in left wrist 06/06/2016  ? Pedal edema 05/06/2019  ? Primary osteoarthritis of both feet 01/17/2017  ?  Primary osteoarthritis of both hands 12/13/2016  ? Primary osteoarthritis of both knees 01/17/2017  ? Primary osteoarthritis, left shoulder 12/11/2017  ? Retained orthopedic hardware 11/14/2016  ? Rheumatoid factor positive 12/13/2016  ? Sarcoidosis   ? Severe persistent asthma 11/16/2007  ? Annotation: chronic Qualifier: Diagnosis of  By: Melvyn Novas MD, Christena Deem   ? Shoulder arthritis 02/22/2018  ? Sleep apnea   ? no cpap now  ? Status post dilation of esophageal narrowing   ? Stiffness of left wrist joint 06/06/2016  ? Unspecified hearing loss   ? Vitamin D deficiency 01/17/2017  ? ? ?Past Surgical History:  ?Procedure Laterality Date  ? BLADDER SURGERY    ? CATARACT EXTRACTION Bilateral   ? HARDWARE REMOVAL Left 01/12/2017  ? Procedure: HARDWARE REMOVAL left wrist;  Surgeon: Leanora Cover, MD;  Location: [facility];  Service: Orthopedics;  Laterality: Left;  ? HEMORRHOID SURGERY    ? NASAL SEPTUM SURGERY    ? PARTIAL HYSTERECTOMY    ? REVERSE SHOULDER ARTHROPLASTY Left 02/22/2018  ? REVERSE SHOULDER ARTHROPLASTY Left 02/22/2018  ? Procedure: LEFT REVERSE SHOULDER ARTHROPLASTY;  Surgeon: Meredith Pel, MD;  Location: [facility];  Service: Orthopedics;  Laterality: Left;  ? TONSILLECTOMY AND ADENOIDECTOMY    ? TRANSCAROTID ARTERY REVASCULARIZATION?  Left 01/17/2020  ? Procedure: [place]

## 2022-03-17 ENCOUNTER — Encounter: Payer: Self-pay | Admitting: Allergy and Immunology

## 2022-03-26 DIAGNOSIS — G4733 Obstructive sleep apnea (adult) (pediatric): Secondary | ICD-10-CM | POA: Diagnosis not present

## 2022-03-27 DIAGNOSIS — E119 Type 2 diabetes mellitus without complications: Secondary | ICD-10-CM | POA: Diagnosis not present

## 2022-03-27 DIAGNOSIS — J4540 Moderate persistent asthma, uncomplicated: Secondary | ICD-10-CM | POA: Diagnosis not present

## 2022-03-28 DIAGNOSIS — D860 Sarcoidosis of lung: Secondary | ICD-10-CM | POA: Diagnosis not present

## 2022-03-28 DIAGNOSIS — H65191 Other acute nonsuppurative otitis media, right ear: Secondary | ICD-10-CM | POA: Diagnosis not present

## 2022-03-28 DIAGNOSIS — Z6829 Body mass index (BMI) 29.0-29.9, adult: Secondary | ICD-10-CM | POA: Diagnosis not present

## 2022-04-03 ENCOUNTER — Other Ambulatory Visit: Payer: Self-pay | Admitting: Allergy and Immunology

## 2022-04-08 ENCOUNTER — Ambulatory Visit: Payer: Medicare HMO | Admitting: Orthopedic Surgery

## 2022-04-13 ENCOUNTER — Ambulatory Visit (INDEPENDENT_AMBULATORY_CARE_PROVIDER_SITE_OTHER): Payer: Medicare HMO | Admitting: *Deleted

## 2022-04-13 ENCOUNTER — Ambulatory Visit: Payer: Medicare HMO

## 2022-04-13 ENCOUNTER — Telehealth: Payer: Self-pay

## 2022-04-13 ENCOUNTER — Ambulatory Visit: Payer: Medicare HMO | Admitting: Orthopedic Surgery

## 2022-04-13 DIAGNOSIS — J4550 Severe persistent asthma, uncomplicated: Secondary | ICD-10-CM

## 2022-04-13 DIAGNOSIS — M1712 Unilateral primary osteoarthritis, left knee: Secondary | ICD-10-CM | POA: Diagnosis not present

## 2022-04-13 DIAGNOSIS — M1711 Unilateral primary osteoarthritis, right knee: Secondary | ICD-10-CM

## 2022-04-13 NOTE — Telephone Encounter (Signed)
Noted  

## 2022-04-13 NOTE — Telephone Encounter (Signed)
Auth needed for bilat knee gel injection  

## 2022-04-16 ENCOUNTER — Encounter: Payer: Self-pay | Admitting: Orthopedic Surgery

## 2022-04-16 NOTE — Progress Notes (Signed)
Office Visit Note   Patient: [name]           Date of Birth: 1946-08-31           MRN: [id number] Visit Date: 04/13/2022 Requested by: Serita Grammes, MD [address] [place],  [place] [address] PCP: Serita Grammes, MD  Subjective: Chief Complaint  Patient presents with   Other     Follow up bilat knee s/p injections    HPI: Patient presents for evaluation of bilateral knee pain.  She was doing well until she had a fall on Monday.  Sustained an elbow laceration.  Denies any hip pain.  Has been able to ambulate.  Injections on 03/11/2022 gave her good relief.  Denies any other orthopedic complaints or loss of consciousness.              ROS: All systems reviewed are negative as they relate to the chief complaint within the history of present illness.  Patient denies  fevers or chills.   Assessment & Plan: Visit Diagnoses:  1. Primary osteoarthritis of right knee   2. Unilateral primary osteoarthritis, left knee     Plan: Impression is bilateral knee arthritis with exacerbation following fall.  She has a left elbow laceration which is superficial and healing well.  No elbow joint effusion and good function of the biceps and triceps tendons.  Plan is return July 14 or 15 for bilateral knee injections.  Nothing really to do for the elbow at this time follow-up in July  Follow-Up Instructions: No follow-ups on file.   Orders:  No orders of the defined types were placed in this encounter.  No orders of the defined types were placed in this encounter.     Procedures: No procedures performed   Clinical Data: No additional findings.  Objective: Vital Signs: There were no vitals taken for this visit.  Physical Exam:   Constitutional: Patient appears well-developed HEENT:  Head: Normocephalic Eyes:EOM are normal Neck: Normal range of motion Cardiovascular: Normal rate Pulmonary/chest: Effort normal Neurologic: Patient is alert Skin: Skin is  warm Psychiatric: Patient has normal mood and affect   Ortho Exam: Ortho exam demonstrates full active and passive range of motion of the left elbow with pronation supination and palpable biceps and triceps tendon.  Motor or sensory function to the left hand is intact.  Radial pulse intact.  She does have a superficial laceration measuring about 2 cm on the lateral aspect of the elbow but otherwise it is healing well with no induration fluctuance or evidence of infection.  Both knees are examined.  She has intact extensor mechanisms bilaterally with stable collateral cruciate ligaments.  Pedal pulses palpable.  No groin pain with internal and external rotation of the leg on either side.  Specialty Comments:  No specialty comments available.  Imaging: No results found.   PMFS History: Patient Active Problem List   Diagnosis Date Noted   Allergy    Anxiety    Arthritis    Cataract    Depression    DM (diabetes mellitus) ([place])    Dyspnea    Heart murmur    History of kidney stones    Hyperlipidemia    Kidney stones    Sleep apnea    Status post dilation of esophageal narrowing    Asthma 01/16/2020   History of transient ischemic attack (TIA) 01/16/2020   Bilateral carotid artery stenosis 01/16/2020   Normocytic anemia 01/16/2020   Diabetes mellitus type II,  non insulin dependent ([place]) 07/05/2019   Mild aortic stenosis 07/05/2019   Mitral regurgitation 07/05/2019   Pedal edema 05/06/2019   DOE (dyspnea on exertion) 05/06/2019   Hearing loss, mixed, bilateral 12/17/2018   Bilateral primary osteoarthritis of knee 10/16/2018   OSA (obstructive sleep apnea) 09/28/2018   Shoulder arthritis 02/22/2018   Primary osteoarthritis, left shoulder 12/11/2017   DDD (degenerative disc disease), lumbar 01/23/2017   History of diabetes mellitus 01/23/2017   History of depression 01/23/2017   History of asthma 01/23/2017   Dyslipidemia 01/23/2017   Age-related osteoporosis without current  pathological fracture 01/23/2017   History of gastroesophageal reflux (GERD) 01/17/2017   Primary osteoarthritis of both knees 01/17/2017   Primary osteoarthritis of both feet 01/17/2017   Vitamin D deficiency 01/17/2017   Rheumatoid factor positive 12/13/2016   Primary osteoarthritis of both hands 12/13/2016   Retained orthopedic hardware 11/14/2016   Pain in left wrist 06/06/2016   Stiffness of left wrist joint 06/06/2016   Closed fracture of distal end of radius 06/05/2016   Acquired trigger finger 04/20/2016   Obstructive lung disease ([place]) 07/29/2015   H/O allergic rhinitis 07/29/2015   LPRD (laryngopharyngeal reflux disease) 07/29/2015   Chest pain 11/01/2013   Murmur 11/01/2013   DM 03/03/2010   ABDOMINAL PAIN RIGHT LOWER QUADRANT 03/03/2010   FULL INCONTINENCE OF FECES 03/03/2010   H/O adenomatous polyp of colon 2011   DECREASED HEARING 05/02/2008   Essential hypertension 05/02/2008   ESOPHAGITIS 05/02/2008   HIATAL HERNIA 05/02/2008   RECTAL INCONTINENCE 05/02/2008   SARCOIDOSIS 11/16/2007   Severe persistent asthma 11/16/2007   GERD 11/16/2007   IRRITABLE BOWEL SYNDROME 11/16/2007   Past Medical History:  Diagnosis Date   ABDOMINAL PAIN RIGHT LOWER QUADRANT 03/03/2010   Qualifier: Diagnosis of  By: Olevia Perches MD, Lowella Bandy    Acquired trigger finger 04/20/2016   Age-related osteoporosis without current pathological fracture 01/23/2017   Allergy    Anxiety    Arthritis    Asthma    Bilateral carotid artery stenosis 01/16/2020   Bilateral primary osteoarthritis of knee 10/16/2018   Cataract    bilateral and removed   Chest pain 11/01/2013   Closed fracture of distal end of radius 06/05/2016   DDD (degenerative disc disease), lumbar 01/23/2017   Depression    Diabetes mellitus type II, non insulin dependent ([place]) 07/05/2019   Diaphragmatic hernia without mention of obstruction or gangrene    DM 03/03/2010   Qualifier: Diagnosis of  By: Tamala Julian CMA, Claiborne Billings     DM (diabetes  mellitus) ([place])    DOE (dyspnea on exertion) 05/06/2019   Dyslipidemia 01/23/2017   Dyspnea    Esophageal reflux    Esophagitis, unspecified    Essential hypertension 05/02/2008   Qualifier: Diagnosis of  By: Nelson-Smith CMA (AAMA), Dottie     Full incontinence of feces 03/03/2010   Qualifier: Diagnosis of  By: Olevia Perches MD, Lowella Bandy    H/O adenomatous polyp of colon 2011   H/O allergic rhinitis 07/29/2015   Hearing loss, mixed, bilateral 12/17/2018   Heart murmur    no cardiologist, family Dr. Maryfrances Bunnell   History of asthma 01/23/2017   History of depression 01/23/2017   History of diabetes mellitus 01/23/2017   History of gastroesophageal reflux (GERD) 01/17/2017   History of kidney stones    History of transient ischemic attack (TIA) 01/16/2020   Hyperlipidemia    Incontinence of feces    Irritable bowel syndrome    Kidney stones  LPRD (laryngopharyngeal reflux disease) 07/29/2015   Mild aortic stenosis 07/05/2019   Mitral regurgitation 07/05/2019   Murmur 11/01/2013   Normocytic anemia 01/16/2020   Obstructive lung disease ([place]) 07/29/2015   OSA (obstructive sleep apnea) 09/28/2018   Pain in left wrist 06/06/2016   Pedal edema 05/06/2019   Primary osteoarthritis of both feet 01/17/2017   Primary osteoarthritis of both hands 12/13/2016   Primary osteoarthritis of both knees 01/17/2017   Primary osteoarthritis, left shoulder 12/11/2017   Retained orthopedic hardware 11/14/2016   Rheumatoid factor positive 12/13/2016   Sarcoidosis    Severe persistent asthma 11/16/2007   Annotation: chronic Qualifier: Diagnosis of  By: Melvyn Novas MD, Christena Deem    Shoulder arthritis 02/22/2018   Sleep apnea    no cpap now   Status post dilation of esophageal narrowing    Stiffness of left wrist joint 06/06/2016   Unspecified hearing loss    Vitamin D deficiency 01/17/2017    Family History  Problem Relation Age of Onset   Esophageal cancer Father    Diabetes Mother    Diabetes Maternal Grandmother    Colon cancer Neg  Hx    Rectal cancer Neg Hx    Stomach cancer Neg Hx     Past Surgical History:  Procedure Laterality Date   BLADDER SURGERY     CATARACT EXTRACTION Bilateral    HARDWARE REMOVAL Left 01/12/2017   Procedure: HARDWARE REMOVAL left wrist;  Surgeon: Leanora Cover, MD;  Location: [facility];  Service: Orthopedics;  Laterality: Left;   HEMORRHOID SURGERY     NASAL SEPTUM SURGERY     PARTIAL HYSTERECTOMY     REVERSE SHOULDER ARTHROPLASTY Left 02/22/2018   REVERSE SHOULDER ARTHROPLASTY Left 02/22/2018   Procedure: LEFT REVERSE SHOULDER ARTHROPLASTY;  Surgeon: Meredith Pel, MD;  Location: [facility];  Service: Orthopedics;  Laterality: Left;   TONSILLECTOMY AND ADENOIDECTOMY     TRANSCAROTID ARTERY REVASCULARIZATION  Left 01/17/2020   Procedure: TRANSCAROTID ARTERY REVASCULARIZATION;  Surgeon: Serafina Mitchell, MD;  Location: [facility];  Service: Vascular;  Laterality: Left;   TRIGGER FINGER RELEASE Left 05/12/2016   Procedure: RELEASE TRIGGER FINGER/A-1 PULLEY INFECTION LEFT RING TENDON SHEATH INJECT RIGHT INDEX FINGER;  Surgeon: Leanora Cover, MD;  Location: [facility];  Service: Orthopedics;  Laterality: Left;   TUBAL LIGATION     Social History   Occupational History   Occupation: retired  Tobacco Use   Smoking status: Never   Smokeless tobacco: Never  Vaping Use   Vaping Use: Never used  Substance and Sexual Activity   Alcohol use: No   Drug use: No   Sexual activity: Not on file

## 2022-04-22 ENCOUNTER — Telehealth: Payer: Self-pay

## 2022-04-22 NOTE — Telephone Encounter (Signed)
VOB submitted for SynviscOne, bilateral knee BV pending 

## 2022-04-25 DIAGNOSIS — G4733 Obstructive sleep apnea (adult) (pediatric): Secondary | ICD-10-CM | POA: Diagnosis not present

## 2022-04-27 DIAGNOSIS — E119 Type 2 diabetes mellitus without complications: Secondary | ICD-10-CM | POA: Diagnosis not present

## 2022-04-27 DIAGNOSIS — J4540 Moderate persistent asthma, uncomplicated: Secondary | ICD-10-CM | POA: Diagnosis not present

## 2022-05-11 ENCOUNTER — Ambulatory Visit (INDEPENDENT_AMBULATORY_CARE_PROVIDER_SITE_OTHER): Payer: Medicare HMO | Admitting: *Deleted

## 2022-05-11 DIAGNOSIS — J4550 Severe persistent asthma, uncomplicated: Secondary | ICD-10-CM

## 2022-05-19 ENCOUNTER — Ambulatory Visit: Payer: Medicare HMO | Admitting: Podiatry

## 2022-05-19 DIAGNOSIS — N3946 Mixed incontinence: Secondary | ICD-10-CM | POA: Diagnosis not present

## 2022-05-25 DIAGNOSIS — S51012A Laceration without foreign body of left elbow, initial encounter: Secondary | ICD-10-CM | POA: Diagnosis not present

## 2022-05-27 DIAGNOSIS — J4540 Moderate persistent asthma, uncomplicated: Secondary | ICD-10-CM | POA: Diagnosis not present

## 2022-05-27 DIAGNOSIS — E119 Type 2 diabetes mellitus without complications: Secondary | ICD-10-CM | POA: Diagnosis not present

## 2022-06-02 ENCOUNTER — Ambulatory Visit (INDEPENDENT_AMBULATORY_CARE_PROVIDER_SITE_OTHER): Payer: Medicare HMO | Admitting: Podiatry

## 2022-06-02 ENCOUNTER — Encounter: Payer: Self-pay | Admitting: Podiatry

## 2022-06-02 DIAGNOSIS — M79674 Pain in right toe(s): Secondary | ICD-10-CM

## 2022-06-02 DIAGNOSIS — B351 Tinea unguium: Secondary | ICD-10-CM

## 2022-06-02 DIAGNOSIS — E119 Type 2 diabetes mellitus without complications: Secondary | ICD-10-CM

## 2022-06-02 DIAGNOSIS — M79675 Pain in left toe(s): Secondary | ICD-10-CM | POA: Diagnosis not present

## 2022-06-07 NOTE — Progress Notes (Signed)
  Subjective:  Patient ID: [name], female    DOB: 1946/05/07,  MRN: [id number]  [name] presents to clinic today for at risk foot care. Pt has h/o NIDDM with PAD and painful elongated mycotic toenails 1-5 bilaterally which are tender when wearing enclosed shoe gear. Pain is relieved with periodic professional debridement.  Patient states blood glucose was 134 mg/dl today.  Last A1c was 6.0%.  New problem(s): None.   PCP is Serita Grammes, MD , and last visit was  Mar 28, 2022  Allergies  Allergen Reactions   Codeine Nausea And Vomiting   Sulfa Antibiotics Other (See Comments)    Allergic per pt's mother     Review of Systems: Negative except as noted in the HPI.  Objective: No changes noted in today's physical examination. There were no vitals filed for this visit.  [name] is a pleasant 76 y.o. female in NAD. AAO X 3.  Vascular Examination: CFT <3 seconds b/l LE. Palpable DP pulse(s) b/l LE. Diminished PT pulse(s) b/l LE. Pedal hair absent. No pain with calf compression b/l. Trace edema noted BLE. No cyanosis or clubbing noted b/l LE.  Dermatological Examination: Pedal skin is warm and supple b/l LE. No open wounds b/l LE. No interdigital macerations noted b/l LE. Toenails 1-5 b/l elongated, discolored, dystrophic, thickened, crumbly with subungual debris and tenderness to dorsal palpation. No hyperkeratotic nor porokeratotic lesions present on today's visit.  Neurological Examination: Protective sensation intact 5/5 intact bilaterally with 10g monofilament b/l. Vibratory sensation intact b/l.  Musculoskeletal Examination: Muscle strength 5/5 to all lower extremity muscle groups bilaterally. Mild hammertoe deformity bilateral 5th digits.  Assessment/Plan: 1. Pain due to onychomycosis of toenails of both feet   2. Diabetes mellitus type II, non insulin dependent ([place])      -Examined patient. -Continue foot and shoe inspections daily. Monitor  blood glucose per PCP/Endocrinologist's recommendations. -Patient to continue soft, supportive shoe gear daily. -Toenails 1-5 b/l were debrided in length and girth with sterile nail nippers and dremel without iatrogenic bleeding.  -Patient/POA to call should there be question/concern in the interim.   Return in about 3 months (around 09/02/2022).  [name], DPM

## 2022-06-08 ENCOUNTER — Ambulatory Visit (INDEPENDENT_AMBULATORY_CARE_PROVIDER_SITE_OTHER): Payer: Medicare HMO | Admitting: *Deleted

## 2022-06-08 DIAGNOSIS — J4550 Severe persistent asthma, uncomplicated: Secondary | ICD-10-CM

## 2022-06-10 ENCOUNTER — Telehealth: Payer: Self-pay

## 2022-06-10 NOTE — Telephone Encounter (Signed)
PA pending for SynviscOne, bilateral knee.

## 2022-06-13 ENCOUNTER — Other Ambulatory Visit: Payer: Self-pay

## 2022-06-13 ENCOUNTER — Ambulatory Visit: Payer: Medicare HMO | Admitting: Surgical

## 2022-06-13 DIAGNOSIS — M1711 Unilateral primary osteoarthritis, right knee: Secondary | ICD-10-CM

## 2022-06-13 DIAGNOSIS — M1712 Unilateral primary osteoarthritis, left knee: Secondary | ICD-10-CM

## 2022-06-13 DIAGNOSIS — M170 Bilateral primary osteoarthritis of knee: Secondary | ICD-10-CM

## 2022-06-16 DIAGNOSIS — R350 Frequency of micturition: Secondary | ICD-10-CM | POA: Diagnosis not present

## 2022-06-16 DIAGNOSIS — N3946 Mixed incontinence: Secondary | ICD-10-CM | POA: Diagnosis not present

## 2022-06-20 ENCOUNTER — Encounter: Payer: Self-pay | Admitting: Orthopedic Surgery

## 2022-06-20 MED ORDER — LIDOCAINE HCL 1 % IJ SOLN
5.0000 mL | INTRAMUSCULAR | Status: AC | PRN
  Administered 2022-06-13: 5 mL

## 2022-06-20 MED ORDER — HYLAN G-F 20 48 MG/6ML IX SOSY
48.0000 mg | PREFILLED_SYRINGE | INTRA_ARTICULAR | Status: AC | PRN
  Administered 2022-06-13: 48 mg via INTRA_ARTICULAR

## 2022-06-20 NOTE — Progress Notes (Signed)
   Procedure Note  Patient: [name]             Date of Birth: [birth year]           MRN: [id number]             Visit Date: 06/13/2022  Procedures: Visit Diagnoses:  1. Primary osteoarthritis of right knee   2. Unilateral primary osteoarthritis, left knee     Large Joint Inj: bilateral knee on 06/13/2022 10:32 AM Indications: diagnostic evaluation, joint swelling and pain Details: 18 G 1.5 in needle, superolateral approach  Arthrogram: No  Medications (Right): 5 mL lidocaine 1 %; 48 mg Hylan 48 MG/6ML Medications (Left): 5 mL lidocaine 1 %; 48 mg Hylan 48 MG/6ML Outcome: tolerated well, no immediate complications Procedure, treatment alternatives, risks and benefits explained, specific risks discussed. Consent was given by the patient. Immediately prior to procedure a time out was called to verify the correct patient, procedure, equipment, support staff and site/side marked as required. Patient was prepped and draped in the usual sterile fashion.

## 2022-06-25 DIAGNOSIS — J45909 Unspecified asthma, uncomplicated: Secondary | ICD-10-CM | POA: Diagnosis not present

## 2022-06-25 DIAGNOSIS — J209 Acute bronchitis, unspecified: Secondary | ICD-10-CM | POA: Diagnosis not present

## 2022-06-27 DIAGNOSIS — E119 Type 2 diabetes mellitus without complications: Secondary | ICD-10-CM | POA: Diagnosis not present

## 2022-06-27 DIAGNOSIS — J4540 Moderate persistent asthma, uncomplicated: Secondary | ICD-10-CM | POA: Diagnosis not present

## 2022-07-02 DIAGNOSIS — K920 Hematemesis: Secondary | ICD-10-CM | POA: Diagnosis not present

## 2022-07-02 DIAGNOSIS — K226 Gastro-esophageal laceration-hemorrhage syndrome: Secondary | ICD-10-CM | POA: Diagnosis not present

## 2022-07-03 DIAGNOSIS — K3189 Other diseases of stomach and duodenum: Secondary | ICD-10-CM | POA: Diagnosis not present

## 2022-07-03 DIAGNOSIS — I491 Atrial premature depolarization: Secondary | ICD-10-CM | POA: Diagnosis not present

## 2022-07-03 DIAGNOSIS — Z7985 Long-term (current) use of injectable non-insulin antidiabetic drugs: Secondary | ICD-10-CM | POA: Diagnosis not present

## 2022-07-03 DIAGNOSIS — D869 Sarcoidosis, unspecified: Secondary | ICD-10-CM | POA: Diagnosis not present

## 2022-07-03 DIAGNOSIS — Z8261 Family history of arthritis: Secondary | ICD-10-CM | POA: Diagnosis not present

## 2022-07-03 DIAGNOSIS — K226 Gastro-esophageal laceration-hemorrhage syndrome: Secondary | ICD-10-CM

## 2022-07-03 DIAGNOSIS — K449 Diaphragmatic hernia without obstruction or gangrene: Secondary | ICD-10-CM | POA: Diagnosis not present

## 2022-07-03 DIAGNOSIS — Z7983 Long term (current) use of bisphosphonates: Secondary | ICD-10-CM | POA: Diagnosis not present

## 2022-07-03 DIAGNOSIS — Z7982 Long term (current) use of aspirin: Secondary | ICD-10-CM | POA: Diagnosis not present

## 2022-07-03 DIAGNOSIS — Z833 Family history of diabetes mellitus: Secondary | ICD-10-CM | POA: Diagnosis not present

## 2022-07-03 DIAGNOSIS — J4550 Severe persistent asthma, uncomplicated: Secondary | ICD-10-CM | POA: Diagnosis not present

## 2022-07-03 DIAGNOSIS — E119 Type 2 diabetes mellitus without complications: Secondary | ICD-10-CM | POA: Diagnosis not present

## 2022-07-03 DIAGNOSIS — I10 Essential (primary) hypertension: Secondary | ICD-10-CM | POA: Diagnosis not present

## 2022-07-03 DIAGNOSIS — K219 Gastro-esophageal reflux disease without esophagitis: Secondary | ICD-10-CM | POA: Diagnosis not present

## 2022-07-03 DIAGNOSIS — Z90710 Acquired absence of both cervix and uterus: Secondary | ICD-10-CM | POA: Diagnosis not present

## 2022-07-03 DIAGNOSIS — Z7984 Long term (current) use of oral hypoglycemic drugs: Secondary | ICD-10-CM | POA: Diagnosis not present

## 2022-07-03 DIAGNOSIS — K922 Gastrointestinal hemorrhage, unspecified: Secondary | ICD-10-CM | POA: Diagnosis not present

## 2022-07-03 DIAGNOSIS — F419 Anxiety disorder, unspecified: Secondary | ICD-10-CM | POA: Diagnosis not present

## 2022-07-03 DIAGNOSIS — R69 Illness, unspecified: Secondary | ICD-10-CM | POA: Diagnosis not present

## 2022-07-03 DIAGNOSIS — G4733 Obstructive sleep apnea (adult) (pediatric): Secondary | ICD-10-CM | POA: Diagnosis not present

## 2022-07-03 DIAGNOSIS — E785 Hyperlipidemia, unspecified: Secondary | ICD-10-CM | POA: Diagnosis not present

## 2022-07-03 DIAGNOSIS — Z8249 Family history of ischemic heart disease and other diseases of the circulatory system: Secondary | ICD-10-CM | POA: Diagnosis not present

## 2022-07-03 DIAGNOSIS — D62 Acute posthemorrhagic anemia: Secondary | ICD-10-CM | POA: Diagnosis not present

## 2022-07-03 DIAGNOSIS — Z66 Do not resuscitate: Secondary | ICD-10-CM | POA: Diagnosis not present

## 2022-07-03 DIAGNOSIS — K920 Hematemesis: Secondary | ICD-10-CM | POA: Diagnosis not present

## 2022-07-03 HISTORY — DX: Gastro-esophageal laceration-hemorrhage syndrome: K22.6

## 2022-07-06 ENCOUNTER — Ambulatory Visit: Payer: Medicare HMO

## 2022-07-06 DIAGNOSIS — K226 Gastro-esophageal laceration-hemorrhage syndrome: Secondary | ICD-10-CM | POA: Diagnosis not present

## 2022-07-06 DIAGNOSIS — D62 Acute posthemorrhagic anemia: Secondary | ICD-10-CM | POA: Diagnosis not present

## 2022-07-07 ENCOUNTER — Ambulatory Visit (INDEPENDENT_AMBULATORY_CARE_PROVIDER_SITE_OTHER): Payer: Medicare HMO | Admitting: *Deleted

## 2022-07-07 DIAGNOSIS — J4550 Severe persistent asthma, uncomplicated: Secondary | ICD-10-CM

## 2022-07-12 DIAGNOSIS — Z6827 Body mass index (BMI) 27.0-27.9, adult: Secondary | ICD-10-CM | POA: Diagnosis not present

## 2022-07-12 DIAGNOSIS — M5441 Lumbago with sciatica, right side: Secondary | ICD-10-CM | POA: Diagnosis not present

## 2022-07-12 DIAGNOSIS — D649 Anemia, unspecified: Secondary | ICD-10-CM | POA: Diagnosis not present

## 2022-07-12 DIAGNOSIS — K226 Gastro-esophageal laceration-hemorrhage syndrome: Secondary | ICD-10-CM | POA: Diagnosis not present

## 2022-07-12 DIAGNOSIS — Z7689 Persons encountering health services in other specified circumstances: Secondary | ICD-10-CM | POA: Diagnosis not present

## 2022-07-27 ENCOUNTER — Ambulatory Visit: Payer: Medicare HMO | Admitting: Orthopedic Surgery

## 2022-07-27 ENCOUNTER — Encounter: Payer: Self-pay | Admitting: Orthopedic Surgery

## 2022-07-27 DIAGNOSIS — M1711 Unilateral primary osteoarthritis, right knee: Secondary | ICD-10-CM | POA: Diagnosis not present

## 2022-07-27 DIAGNOSIS — M1712 Unilateral primary osteoarthritis, left knee: Secondary | ICD-10-CM | POA: Diagnosis not present

## 2022-07-27 NOTE — Progress Notes (Signed)
Office Visit Note   Patient: [name]           Date of Birth: [birth year]           MRN: [id number] Visit Date: 07/27/2022 Requested by: Serita Grammes, MD [address] [place],  [place] [address] PCP: Serita Grammes, MD  Subjective: Chief Complaint  Patient presents with   Right Knee - Pain   Left Knee - Pain    HPI: [name] is a 76 year old patient with bilateral knee pain right worse than left.  Had bilateral knee injections which were gel injection 06/13/2022.  Had cortisone injections in April.  Taking Tylenol with some relief.  Reports continued medial pain with on the left-hand side.  Does wake her from sleep at night.  Denies any groin pain.  Denies much in the way of radicular symptoms.  Husband is having surgery in the near future.  She needs to be ready to help him.              ROS: All systems reviewed are negative as they relate to the chief complaint within the history of present illness.  Patient denies  fevers or chills.   Assessment & Plan: Visit Diagnoses:  1. Primary osteoarthritis of right knee   2. Unilateral primary osteoarthritis, left knee     Plan: Impression is bilateral knee arthritis.  Plan is patient is doing moderately well with straight gel injections.  Did better with cortisone injections in April.  Plan is for bilateral cortisone injections as scheduled injections October 15 which is 6 months after her last cortisone injections.  Follow-Up Instructions: Return in about 8 weeks (around 09/21/2022).   Orders:  No orders of the defined types were placed in this encounter.  No orders of the defined types were placed in this encounter.     Procedures: No procedures performed   Clinical Data: No additional findings.  Objective: Vital Signs: There were no vitals taken for this visit.  Physical Exam:   Constitutional: Patient appears well-developed HEENT:  Head: Normocephalic Eyes:EOM are normal Neck: Normal range of  motion Cardiovascular: Normal rate Pulmonary/chest: Effort normal Neurologic: Patient is alert Skin: Skin is warm Psychiatric: Patient has normal mood and affect   Ortho Exam: Ortho exam demonstrates full active and passive range of motion of both knees but she does have about 5 degree flexion contractures in both knees.  Flexion to about 115.  No effusion in either knee.  Collateral crucial ligaments are stable.  Has medial joint line tenderness.  Lateral joint line tenderness in both knees.  Does have more patellofemoral crepitus on the right-hand side than the left  Specialty Comments:  No specialty comments available.  Imaging: No results found.   PMFS History: Patient Active Problem List   Diagnosis Date Noted   Allergy    Anxiety    Arthritis    Cataract    Depression    DM (diabetes mellitus) ([place])    Dyspnea    Heart murmur    History of kidney stones    Hyperlipidemia    Kidney stones    Sleep apnea    Status post dilation of esophageal narrowing    Asthma 01/16/2020   History of transient ischemic attack (TIA) 01/16/2020   Bilateral carotid artery stenosis 01/16/2020   Normocytic anemia 01/16/2020   Diabetes mellitus type II, non insulin dependent ([place]) 07/05/2019   Mild aortic stenosis 07/05/2019   Mitral regurgitation 07/05/2019   Pedal edema  05/06/2019   DOE (dyspnea on exertion) 05/06/2019   Hearing loss, mixed, bilateral 12/17/2018   Bilateral primary osteoarthritis of knee 10/16/2018   OSA (obstructive sleep apnea) 09/28/2018   Shoulder arthritis 02/22/2018   Primary osteoarthritis, left shoulder 12/11/2017   DDD (degenerative disc disease), lumbar 01/23/2017   History of diabetes mellitus 01/23/2017   History of depression 01/23/2017   History of asthma 01/23/2017   Dyslipidemia 01/23/2017   Age-related osteoporosis without current pathological fracture 01/23/2017   History of gastroesophageal reflux (GERD) 01/17/2017   Primary osteoarthritis of  both knees 01/17/2017   Primary osteoarthritis of both feet 01/17/2017   Vitamin D deficiency 01/17/2017   Rheumatoid factor positive 12/13/2016   Primary osteoarthritis of both hands 12/13/2016   Retained orthopedic hardware 11/14/2016   Pain in left wrist 06/06/2016   Stiffness of left wrist joint 06/06/2016   Closed fracture of distal end of radius 06/05/2016   Acquired trigger finger 04/20/2016   Obstructive lung disease ([place]) 07/29/2015   H/O allergic rhinitis 07/29/2015   LPRD (laryngopharyngeal reflux disease) 07/29/2015   Chest pain 11/01/2013   Murmur 11/01/2013   DM 03/03/2010   ABDOMINAL PAIN RIGHT LOWER QUADRANT 03/03/2010   FULL INCONTINENCE OF FECES 03/03/2010   H/O adenomatous polyp of colon 2011   DECREASED HEARING 05/02/2008   Essential hypertension 05/02/2008   ESOPHAGITIS 05/02/2008   HIATAL HERNIA 05/02/2008   RECTAL INCONTINENCE 05/02/2008   SARCOIDOSIS 11/16/2007   Severe persistent asthma 11/16/2007   GERD 11/16/2007   IRRITABLE BOWEL SYNDROME 11/16/2007   Past Medical History:  Diagnosis Date   ABDOMINAL PAIN RIGHT LOWER QUADRANT 03/03/2010   Qualifier: Diagnosis of  By: Olevia Perches MD, Lowella Bandy    Acquired trigger finger 04/20/2016   Age-related osteoporosis without current pathological fracture 01/23/2017   Allergy    Anxiety    Arthritis    Asthma    Bilateral carotid artery stenosis 01/16/2020   Bilateral primary osteoarthritis of knee 10/16/2018   Cataract    bilateral and removed   Chest pain 11/01/2013   Closed fracture of distal end of radius 06/05/2016   DDD (degenerative disc disease), lumbar 01/23/2017   Depression    Diabetes mellitus type II, non insulin dependent ([place]) 07/05/2019   Diaphragmatic hernia without mention of obstruction or gangrene    DM 03/03/2010   Qualifier: Diagnosis of  By: Tamala Julian CMA, Claiborne Billings     DM (diabetes mellitus) ([place])    DOE (dyspnea on exertion) 05/06/2019   Dyslipidemia 01/23/2017   Dyspnea    Esophageal reflux     Esophagitis, unspecified    Essential hypertension 05/02/2008   Qualifier: Diagnosis of  By: Nelson-Smith CMA (AAMA), Dottie     Full incontinence of feces 03/03/2010   Qualifier: Diagnosis of  By: Olevia Perches MD, Lowella Bandy    H/O adenomatous polyp of colon 2011   H/O allergic rhinitis 07/29/2015   Hearing loss, mixed, bilateral 12/17/2018   Heart murmur    no cardiologist, family Dr. Maryfrances Bunnell   History of asthma 01/23/2017   History of depression 01/23/2017   History of diabetes mellitus 01/23/2017   History of gastroesophageal reflux (GERD) 01/17/2017   History of kidney stones    History of transient ischemic attack (TIA) 01/16/2020   Hyperlipidemia    Incontinence of feces    Irritable bowel syndrome    Kidney stones    LPRD (laryngopharyngeal reflux disease) 07/29/2015   Mild aortic stenosis 07/05/2019   Mitral regurgitation 07/05/2019   Murmur  11/01/2013   Normocytic anemia 01/16/2020   Obstructive lung disease ([place]) 07/29/2015   OSA (obstructive sleep apnea) 09/28/2018   Pain in left wrist 06/06/2016   Pedal edema 05/06/2019   Primary osteoarthritis of both feet 01/17/2017   Primary osteoarthritis of both hands 12/13/2016   Primary osteoarthritis of both knees 01/17/2017   Primary osteoarthritis, left shoulder 12/11/2017   Retained orthopedic hardware 11/14/2016   Rheumatoid factor positive 12/13/2016   Sarcoidosis    Severe persistent asthma 11/16/2007   Annotation: chronic Qualifier: Diagnosis of  By: Melvyn Novas MD, Christena Deem    Shoulder arthritis 02/22/2018   Sleep apnea    no cpap now   Status post dilation of esophageal narrowing    Stiffness of left wrist joint 06/06/2016   Unspecified hearing loss    Vitamin D deficiency 01/17/2017    Family History  Problem Relation Age of Onset   Esophageal cancer Father    Diabetes Mother    Diabetes Maternal Grandmother    Colon cancer Neg Hx    Rectal cancer Neg Hx    Stomach cancer Neg Hx     Past Surgical History:  Procedure Laterality Date    BLADDER SURGERY     CATARACT EXTRACTION Bilateral    HARDWARE REMOVAL Left 01/12/2017   Procedure: HARDWARE REMOVAL left wrist;  Surgeon: Leanora Cover, MD;  Location: [facility];  Service: Orthopedics;  Laterality: Left;   HEMORRHOID SURGERY     NASAL SEPTUM SURGERY     PARTIAL HYSTERECTOMY     REVERSE SHOULDER ARTHROPLASTY Left 02/22/2018   REVERSE SHOULDER ARTHROPLASTY Left 02/22/2018   Procedure: LEFT REVERSE SHOULDER ARTHROPLASTY;  Surgeon: Meredith Pel, MD;  Location: [facility];  Service: Orthopedics;  Laterality: Left;   TONSILLECTOMY AND ADENOIDECTOMY     TRANSCAROTID ARTERY REVASCULARIZATION  Left 01/17/2020   Procedure: TRANSCAROTID ARTERY REVASCULARIZATION;  Surgeon: Serafina Mitchell, MD;  Location: [facility];  Service: Vascular;  Laterality: Left;   TRIGGER FINGER RELEASE Left 05/12/2016   Procedure: RELEASE TRIGGER FINGER/A-1 PULLEY INFECTION LEFT RING TENDON SHEATH INJECT RIGHT INDEX FINGER;  Surgeon: Leanora Cover, MD;  Location: [facility];  Service: Orthopedics;  Laterality: Left;   TUBAL LIGATION     Social History   Occupational History   Occupation: retired  Tobacco Use   Smoking status: Never   Smokeless tobacco: Never  Vaping Use   Vaping Use: Never used  Substance and Sexual Activity   Alcohol use: No   Drug use: No   Sexual activity: Not on file

## 2022-07-29 DIAGNOSIS — G4733 Obstructive sleep apnea (adult) (pediatric): Secondary | ICD-10-CM | POA: Diagnosis not present

## 2022-08-03 ENCOUNTER — Ambulatory Visit (INDEPENDENT_AMBULATORY_CARE_PROVIDER_SITE_OTHER): Payer: Medicare HMO | Admitting: *Deleted

## 2022-08-03 DIAGNOSIS — J4550 Severe persistent asthma, uncomplicated: Secondary | ICD-10-CM

## 2022-08-19 DIAGNOSIS — G500 Trigeminal neuralgia: Secondary | ICD-10-CM | POA: Diagnosis not present

## 2022-08-19 DIAGNOSIS — Z961 Presence of intraocular lens: Secondary | ICD-10-CM | POA: Diagnosis not present

## 2022-08-19 DIAGNOSIS — H524 Presbyopia: Secondary | ICD-10-CM | POA: Diagnosis not present

## 2022-08-19 DIAGNOSIS — H353133 Nonexudative age-related macular degeneration, bilateral, advanced atrophic without subfoveal involvement: Secondary | ICD-10-CM | POA: Diagnosis not present

## 2022-08-19 DIAGNOSIS — H43813 Vitreous degeneration, bilateral: Secondary | ICD-10-CM | POA: Diagnosis not present

## 2022-08-19 DIAGNOSIS — E119 Type 2 diabetes mellitus without complications: Secondary | ICD-10-CM | POA: Diagnosis not present

## 2022-08-19 DIAGNOSIS — S0083XA Contusion of other part of head, initial encounter: Secondary | ICD-10-CM | POA: Diagnosis not present

## 2022-08-19 DIAGNOSIS — H5213 Myopia, bilateral: Secondary | ICD-10-CM | POA: Diagnosis not present

## 2022-08-28 DIAGNOSIS — G4733 Obstructive sleep apnea (adult) (pediatric): Secondary | ICD-10-CM | POA: Diagnosis not present

## 2022-08-31 ENCOUNTER — Ambulatory Visit (INDEPENDENT_AMBULATORY_CARE_PROVIDER_SITE_OTHER): Payer: Medicare HMO

## 2022-08-31 DIAGNOSIS — J4550 Severe persistent asthma, uncomplicated: Secondary | ICD-10-CM

## 2022-09-05 ENCOUNTER — Other Ambulatory Visit: Payer: Self-pay | Admitting: Allergy and Immunology

## 2022-09-05 DIAGNOSIS — J069 Acute upper respiratory infection, unspecified: Secondary | ICD-10-CM | POA: Diagnosis not present

## 2022-09-05 DIAGNOSIS — R0981 Nasal congestion: Secondary | ICD-10-CM | POA: Diagnosis not present

## 2022-09-08 ENCOUNTER — Ambulatory Visit: Payer: Medicare HMO | Admitting: Podiatry

## 2022-09-12 ENCOUNTER — Ambulatory Visit (INDEPENDENT_AMBULATORY_CARE_PROVIDER_SITE_OTHER): Payer: Medicare HMO | Admitting: Podiatry

## 2022-09-12 DIAGNOSIS — M79675 Pain in left toe(s): Secondary | ICD-10-CM

## 2022-09-12 DIAGNOSIS — B351 Tinea unguium: Secondary | ICD-10-CM | POA: Diagnosis not present

## 2022-09-12 DIAGNOSIS — E119 Type 2 diabetes mellitus without complications: Secondary | ICD-10-CM

## 2022-09-12 DIAGNOSIS — M79674 Pain in right toe(s): Secondary | ICD-10-CM

## 2022-09-12 NOTE — Progress Notes (Signed)
  Subjective:  Patient ID: RAYE WIENS, female    DOB: 06/27/1946,  MRN: [id number]  Chief Complaint  Patient presents with   Nail Problem    Thick painful toenails, 3 month follow up     76 y.o. female presents with the above complaint. History confirmed with patient. Patient presenting with pain related to dystrophic thickened elongated nails. Patient is unable to trim own nails related to nail dystrophy and/or mobility issues. Patient Theone Stanley a history of T2DM without peripheral neuropathy.   Objective:  Physical Exam: warm, good capillary refill, DP and PT pulses 2/4 bilateral nail exam onychomycosis of the toenails, onycholysis, and dystrophic nails DP pulses palpable, PT pulses palpable, and protective sensation intact Left Foot:  Pain with palpation of nails due to elongation and dystrophic growth.  Right Foot: Pain with palpation of nails due to elongation and dystrophic growth.  Assessment:   1. Pain due to onychomycosis of toenails of both feet   2. Diabetes mellitus type II, non insulin dependent ([place])      Plan:  Patient was evaluated and treated and all questions answered.  #Onychomycosis with pain  -Nails palliatively debrided as below. -Educated on self-care  Procedure: Nail Debridement Rationale: Pain Type of Debridement: manual, sharp debridement. Instrumentation: Nail nipper, rotary burr. Number of Nails: 10  Return in about 3 months (around 12/13/2022) for [facility].         Everitt Amber, DPM [facility]

## 2022-09-14 ENCOUNTER — Ambulatory Visit: Payer: Medicare HMO | Admitting: Allergy and Immunology

## 2022-09-14 ENCOUNTER — Ambulatory Visit: Payer: Medicare HMO | Admitting: Orthopedic Surgery

## 2022-09-26 DIAGNOSIS — N1831 Chronic kidney disease, stage 3a: Secondary | ICD-10-CM | POA: Diagnosis not present

## 2022-09-26 DIAGNOSIS — Z6827 Body mass index (BMI) 27.0-27.9, adult: Secondary | ICD-10-CM | POA: Diagnosis not present

## 2022-09-26 DIAGNOSIS — J189 Pneumonia, unspecified organism: Secondary | ICD-10-CM | POA: Diagnosis not present

## 2022-09-26 DIAGNOSIS — D509 Iron deficiency anemia, unspecified: Secondary | ICD-10-CM | POA: Diagnosis not present

## 2022-09-26 DIAGNOSIS — E1165 Type 2 diabetes mellitus with hyperglycemia: Secondary | ICD-10-CM | POA: Diagnosis not present

## 2022-09-26 DIAGNOSIS — D860 Sarcoidosis of lung: Secondary | ICD-10-CM | POA: Diagnosis not present

## 2022-09-26 DIAGNOSIS — D692 Other nonthrombocytopenic purpura: Secondary | ICD-10-CM | POA: Diagnosis not present

## 2022-09-27 DIAGNOSIS — E119 Type 2 diabetes mellitus without complications: Secondary | ICD-10-CM | POA: Diagnosis not present

## 2022-09-27 DIAGNOSIS — J4540 Moderate persistent asthma, uncomplicated: Secondary | ICD-10-CM | POA: Diagnosis not present

## 2022-09-28 ENCOUNTER — Ambulatory Visit: Payer: Medicare HMO

## 2022-09-28 ENCOUNTER — Ambulatory Visit: Payer: Medicare HMO | Admitting: Allergy and Immunology

## 2022-09-28 ENCOUNTER — Encounter: Payer: Self-pay | Admitting: Allergy and Immunology

## 2022-09-28 VITALS — BP 164/64 | HR 54 | Resp 18 | Ht 60.0 in | Wt 150.8 lb

## 2022-09-28 DIAGNOSIS — J069 Acute upper respiratory infection, unspecified: Secondary | ICD-10-CM

## 2022-09-28 DIAGNOSIS — K219 Gastro-esophageal reflux disease without esophagitis: Secondary | ICD-10-CM

## 2022-09-28 DIAGNOSIS — J4550 Severe persistent asthma, uncomplicated: Secondary | ICD-10-CM

## 2022-09-28 DIAGNOSIS — J3089 Other allergic rhinitis: Secondary | ICD-10-CM | POA: Diagnosis not present

## 2022-09-28 DIAGNOSIS — Z862 Personal history of diseases of the blood and blood-forming organs and certain disorders involving the immune mechanism: Secondary | ICD-10-CM

## 2022-09-28 DIAGNOSIS — G4733 Obstructive sleep apnea (adult) (pediatric): Secondary | ICD-10-CM | POA: Diagnosis not present

## 2022-09-28 MED ORDER — PANTOPRAZOLE SODIUM 40 MG PO TBEC: DELAYED_RELEASE_TABLET | ORAL | 1 refills | Status: DC

## 2022-09-28 MED ORDER — IPRATROPIUM BROMIDE 0.06 % NA SOLN: NASAL | 5 refills | Status: DC

## 2022-09-28 NOTE — Patient Instructions (Addendum)
  1. Continue tezepelumab injections    2. Continue Trelegy 200 - 1 inhalation 1 time per day  3. Continue Flonase - one spray each nostril 1 time per day.    4. Continue pantoprazole 40 twice a day + Famotidine 40 mg in evening  5. If needed:   A. nasal saline   B. Albuterol HFA or Duoneb   C. OTC Mucinex DM 1-2 tablet 1-2 times per day   D. ipratropium 0.06% nasal spray - 1-2 sprays each nostril every 6 hours   6. Complete steroids and antibiotic  7. Return to clinic in 6 months or earlier if problem  8. Obtain flu vaccine and RSV vaccine

## 2022-09-28 NOTE — Progress Notes (Unsigned)
[facility]   Follow-up Note  Referring Provider: Serita Grammes, MD Primary Provider: Serita Grammes, MD Date of Office Visit: 09/28/2022  Subjective:   [name] (DOB: 1946-07-14) is a 76 y.o. female who returns to the [facility] on 09/28/2022 in re-evaluation of the following:  HPI: [name] returns to this clinic in evaluation of asthma, fibrotic lung disease from sarcoidosis, allergic rhinitis, LPR.  Her last visit to this clinic with me was 16 March 2022.  [name] has done pretty well with her respiratory tract issue without the need for systemic steroid or antibiotic and some occasional use of bronchodilator on her current therapy at anti-TSLP antibody and anti-inflammatory agents for both her upper and lower airway.    However, she has been coughing for about 10 days.  Initially this was associated with nasal congestion and sneezing and lots of clear rhinorrhea.  Most of her nasal issue has resolved but she still continues to cough and she makes mucus that for the most part is not ugly.  She went to see her primary care doctor 2 days ago and was given systemic steroids and an antibiotic.  She is a little bit better today. Her husband has undergone a right upper lobe lobectomy for localized lung cancer 06 September 2022 and he also came home and apparently contracted the same viral respiratory tract infection that [name] had and he required rehospitalization for that issue.  She has had very little problems with reflux.  However, she actually had an episode of hematemesis secondary to a Mallory-Weiss tear in August 2023.  Allergies as of 09/28/2022       Reactions   Codeine Nausea And Vomiting   Sulfa Antibiotics Other (See Comments)   Allergic per pt's mother         Medication List    acetaminophen 325 MG tablet Commonly known as: TYLENOL Take 2 tablets (650 mg total) by mouth every 6 (six) hours as  needed for mild pain (or Fever >/= 101).   albuterol 108 (90 Base) MCG/ACT inhaler Commonly known as: VENTOLIN HFA Inhale 2 puffs into the lungs every 6 (six) hours as needed for wheezing or shortness of breath.   alendronate 70 MG tablet Commonly known as: FOSAMAX   aspirin EC 81 MG tablet Take 1 tablet by mouth daily.   atorvastatin 40 MG tablet Commonly known as: LIPITOR Take 40 mg by mouth at bedtime.   cefdinir 300 MG capsule Commonly known as: OMNICEF Take 300 mg by mouth 2 (two) times daily.   citalopram 20 MG tablet Commonly known as: CELEXA Take 20 mg by mouth daily.   diclofenac 25 MG EC tablet Commonly known as: VOLTAREN Take 25 mg by mouth daily as needed.   dicyclomine 20 MG tablet Commonly known as: BENTYL Take by mouth.   EPINEPHrine 0.3 mg/0.3 mL Soaj injection Commonly known as: EPI-PEN Use as directed for life-threatening allergic reaction.   famotidine 40 MG tablet Commonly known as: PEPCID TAKE ONE TABLET BY MOUTH EVERYDAY AT BEDTIME   fluticasone 50 MCG/ACT nasal spray Commonly known as: FLONASE Place 1-2 sprays into both nostrils as needed for allergies or rhinitis.   gabapentin 100 MG capsule Commonly known as: NEURONTIN Take 100 mg by mouth 2 (two) times daily as needed.   ipratropium-albuterol 0.5-2.5 (3) MG/3ML Soln Commonly known as: DUONEB Take 3 mLs by nebulization every 6 (six) hours as needed for wheezing or shortness  of breath (shortness of breath/wheezing).   IRON PO Take by mouth daily.   levocetirizine 5 MG tablet Commonly known as: XYZAL Take 5 mg by mouth at bedtime.   methylPREDNISolone 4 MG Tbpk tablet Commonly known as: MEDROL DOSEPAK Take by mouth.   metoprolol succinate 25 MG 24 hr tablet Commonly known as: TOPROL-XL Take 25 mg by mouth at bedtime.   montelukast 10 MG tablet Commonly known as: SINGULAIR Take 10 mg by mouth daily.   OneTouch Verio test strip Generic drug: glucose blood daily.   Ozempic  (0.25 or 0.5 MG/DOSE) 2 MG/1.5ML Sopn Generic drug: Semaglutide(0.25 or 0.'5MG'$ /DOS) Inject 0.25 mg into the skin every 7 (seven) days.   pantoprazole 40 MG tablet Commonly known as: PROTONIX SMARTSIG:1 Tablet(s) By Mouth Morning-Night   pioglitazone 15 MG tablet Commonly known as: ACTOS Take 15 mg by mouth daily.   PROBIOTIC DAILY PO Take 1 capsule by mouth daily.   Telmisartan-amLODIPine 80-5 MG Tabs Take 1 tablet by mouth daily.   tolterodine 4 MG 24 hr capsule Commonly known as: DETROL LA Take 4 mg by mouth at bedtime.   Trelegy Ellipta 200-62.5-25 MCG/ACT Aepb Generic drug: Fluticasone-Umeclidin-Vilant Inhale 1 puff into the lungs every morning.   VITAMIN C PO Take by mouth.        Past Medical History:  Diagnosis Date   ABDOMINAL PAIN RIGHT LOWER QUADRANT 03/03/2010   Qualifier: Diagnosis of  By: Olevia Perches MD, Lowella Bandy    Acquired trigger finger 04/20/2016   Age-related osteoporosis without current pathological fracture 01/23/2017   Allergy    Anxiety    Arthritis    Asthma    Bilateral carotid artery stenosis 01/16/2020   Bilateral primary osteoarthritis of knee 10/16/2018   Cataract    bilateral and removed   Chest pain 11/01/2013   Closed fracture of distal end of radius 06/05/2016   DDD (degenerative disc disease), lumbar 01/23/2017   Depression    Diabetes mellitus type II, non insulin dependent ([place]) 07/05/2019   Diaphragmatic hernia without mention of obstruction or gangrene    DM 03/03/2010   Qualifier: Diagnosis of  By: Tamala Julian CMA, Claiborne Billings     DM (diabetes mellitus) ([place])    DOE (dyspnea on exertion) 05/06/2019   Dyslipidemia 01/23/2017   Dyspnea    Esophageal reflux    Esophagitis, unspecified    Essential hypertension 05/02/2008   Qualifier: Diagnosis of  By: Nelson-Smith CMA (AAMA), Dottie     Full incontinence of feces 03/03/2010   Qualifier: Diagnosis of  By: Olevia Perches MD, Lowella Bandy    H/O adenomatous polyp of colon 2011   H/O allergic rhinitis  07/29/2015   Hearing loss, mixed, bilateral 12/17/2018   Heart murmur    no cardiologist, family Dr. Maryfrances Bunnell   History of asthma 01/23/2017   History of depression 01/23/2017   History of diabetes mellitus 01/23/2017   History of gastroesophageal reflux (GERD) 01/17/2017   History of kidney stones    History of transient ischemic attack (TIA) 01/16/2020   Hyperlipidemia    Incontinence of feces    Irritable bowel syndrome    Kidney stones    LPRD (laryngopharyngeal reflux disease) 07/29/2015   Mallory-Weiss tear 07/03/2022   Mild aortic stenosis 07/05/2019   Mitral regurgitation 07/05/2019   Murmur 11/01/2013   Normocytic anemia 01/16/2020   Obstructive lung disease ([place]) 07/29/2015   OSA (obstructive sleep apnea) 09/28/2018   Pain in left wrist 06/06/2016   Pedal edema 05/06/2019   Primary osteoarthritis  of both feet 01/17/2017   Primary osteoarthritis of both hands 12/13/2016   Primary osteoarthritis of both knees 01/17/2017   Primary osteoarthritis, left shoulder 12/11/2017   Retained orthopedic hardware 11/14/2016   Rheumatoid factor positive 12/13/2016   Sarcoidosis    Severe persistent asthma 11/16/2007   Annotation: chronic Qualifier: Diagnosis of  By: Melvyn Novas MD, Christena Deem    Shoulder arthritis 02/22/2018   Sleep apnea    no cpap now   Status post dilation of esophageal narrowing    Stiffness of left wrist joint 06/06/2016   Unspecified hearing loss    Vitamin D deficiency 01/17/2017    Past Surgical History:  Procedure Laterality Date   BLADDER SURGERY     CATARACT EXTRACTION Bilateral    HARDWARE REMOVAL Left 01/12/2017   Procedure: HARDWARE REMOVAL left wrist;  Surgeon: Leanora Cover, MD;  Location: [facility];  Service: Orthopedics;  Laterality: Left;   HEMORRHOID SURGERY     NASAL SEPTUM SURGERY     PARTIAL HYSTERECTOMY     REVERSE SHOULDER ARTHROPLASTY Left 02/22/2018   REVERSE SHOULDER ARTHROPLASTY Left 02/22/2018   Procedure: LEFT  REVERSE SHOULDER ARTHROPLASTY;  Surgeon: Meredith Pel, MD;  Location: [facility];  Service: Orthopedics;  Laterality: Left;   TONSILLECTOMY AND ADENOIDECTOMY     TRANSCAROTID ARTERY REVASCULARIZATION  Left 01/17/2020   Procedure: TRANSCAROTID ARTERY REVASCULARIZATION;  Surgeon: Serafina Mitchell, MD;  Location: [facility];  Service: Vascular;  Laterality: Left;   TRIGGER FINGER RELEASE Left 05/12/2016   Procedure: RELEASE TRIGGER FINGER/A-1 PULLEY INFECTION LEFT RING TENDON SHEATH INJECT RIGHT INDEX FINGER;  Surgeon: Leanora Cover, MD;  Location: [facility];  Service: Orthopedics;  Laterality: Left;   TUBAL LIGATION      Review of systems negative except as noted in HPI / PMHx or noted below:  Review of Systems  Constitutional: Negative.   HENT: Negative.    Eyes: Negative.   Respiratory: Negative.    Cardiovascular: Negative.   Gastrointestinal: Negative.   Genitourinary: Negative.   Musculoskeletal: Negative.   Skin: Negative.   Neurological: Negative.   Endo/Heme/Allergies: Negative.   Psychiatric/Behavioral: Negative.       Objective:   Vitals:   09/28/22 1150 09/28/22 1225  BP: (!) 174/72 (!) 164/64  Pulse: (!) 54   Resp: 18   SpO2: 98%    Height: 5' (152.4 cm)  Weight: 150 lb 12.8 oz (68.4 kg)   Physical Exam Constitutional:      Appearance: She is not diaphoretic.  HENT:     Head: Normocephalic.     Right Ear: External ear normal.     Left Ear: External ear normal.     Nose: Nose normal. No mucosal edema or rhinorrhea.     Mouth/Throat:     Pharynx: Uvula midline. No oropharyngeal exudate.  Eyes:     Conjunctiva/sclera: Conjunctivae normal.  Neck:     Thyroid: No thyromegaly.     Trachea: Trachea normal. No tracheal tenderness or tracheal deviation.  Cardiovascular:     Rate and Rhythm: Normal rate and regular rhythm.     Heart sounds: Normal heart sounds, S1 normal and S2 normal. No murmur heard. Pulmonary:     Effort: No respiratory distress.      Breath sounds: No stridor. Wheezing (Bilateral expiratory wheezing all lung fields) present. No rales.  Lymphadenopathy:     Head:     Right side of head: No tonsillar adenopathy.     Left side  of head: No tonsillar adenopathy.     Cervical: No cervical adenopathy.  Skin:    Findings: No erythema or rash.     Nails: There is no clubbing.  Neurological:     Mental Status: She is alert.     Diagnostics:    Spirometry was performed and demonstrated an FEV1 of 0.92 at 52 % of predicted.   Assessment and Plan:   1. Not well controlled severe persistent asthma   2. History of sarcoidosis   3. Other allergic rhinitis   4. LPRD (laryngopharyngeal reflux disease)   5. Viral upper respiratory tract infection   6. Severe persistent asthma without complication    1. Continue tezepelumab injections    2. Continue Trelegy 200 - 1 inhalation 1 time per day  3. Continue Flonase - one spray each nostril 1 time per day.    4. Continue pantoprazole 40 twice a day + Famotidine 40 mg in evening  5. If needed:   A. nasal saline   B. Albuterol HFA or Duoneb   C. OTC Mucinex DM 1-2 tablet 1-2 times per day   D. ipratropium 0.06% nasal spray - 1-2 sprays each nostril every 6 hours   6. Complete steroids and antibiotic  7. Return to clinic in 6 months or earlier if problem  8. Obtain flu vaccine and RSV vaccine   It sounds as though Cailie contracted a viral respiratory tract infection as did her husband about 10 days ago and it has given rise to significant inflammation of not just her upper airway but are also her lower airway.  She is currently using a course of steroids and antibiotics but I suspect that time is what is going to make this issue go away.  She probably has another week before she will break this inflammatory process.  She has actually done relatively well other than this event and she will continue on her biologic agent and anti-inflammatory agents for her airway and  also continue to address the issue with reflux on her current plan.  I will see her back in this clinic in 6 months or earlier if there is a problem.  [name] Katz, MD Allergy / Immunology [place]

## 2022-09-29 ENCOUNTER — Encounter: Payer: Self-pay | Admitting: Allergy and Immunology

## 2022-09-30 ENCOUNTER — Ambulatory Visit: Payer: Medicare HMO | Admitting: Orthopedic Surgery

## 2022-09-30 ENCOUNTER — Encounter: Payer: Self-pay | Admitting: Orthopedic Surgery

## 2022-09-30 DIAGNOSIS — M1712 Unilateral primary osteoarthritis, left knee: Secondary | ICD-10-CM | POA: Diagnosis not present

## 2022-09-30 DIAGNOSIS — M1711 Unilateral primary osteoarthritis, right knee: Secondary | ICD-10-CM

## 2022-09-30 MED ORDER — LIDOCAINE HCL 1 % IJ SOLN
5.0000 mL | INTRAMUSCULAR | Status: AC | PRN
  Administered 2022-09-30: 5 mL

## 2022-09-30 MED ORDER — METHYLPREDNISOLONE ACETATE 40 MG/ML IJ SUSP
40.0000 mg | INTRAMUSCULAR | Status: AC | PRN
  Administered 2022-09-30: 40 mg via INTRA_ARTICULAR

## 2022-09-30 MED ORDER — BUPIVACAINE HCL 0.25 % IJ SOLN
4.0000 mL | INTRAMUSCULAR | Status: AC | PRN
  Administered 2022-09-30: 4 mL via INTRA_ARTICULAR

## 2022-09-30 NOTE — Progress Notes (Signed)
Office Visit Note   Patient: [name]           Date of Birth: August 24, 1946           MRN: [id number] Visit Date: 09/30/2022 Requested by: Serita Grammes, MD [address] [place],  [place] [address] PCP: Serita Grammes, MD  Subjective: Chief Complaint  Patient presents with   Right Knee - Pain, Follow-up   Left Knee - Pain, Follow-up    HPI: [name] is a 76 y.o. female who presents to the office reporting bilateral knee pain.  She has known history of bilateral knee arthritis left slightly worse than right.  No interval history of injury.  Husband was recently diagnosed with lung cancer and had surgery.  She is at home managing a lot of various aspects of his care..                ROS: All systems reviewed are negative as they relate to the chief complaint within the history of present illness.  Patient denies fevers or chills.  Assessment & Plan: Visit Diagnoses: No diagnosis found.  Plan: Impression is bilateral knee arthritis.  Plan is bilateral knee injection performed today.  Follow-up with [place] as needed.  She may need knee replacement at sometime in the future but for now she is managing fairly well with her injections.  Follow-Up Instructions: No follow-ups on file.   Orders:  No orders of the defined types were placed in this encounter.  No orders of the defined types were placed in this encounter.     Procedures: Large Joint Inj: R knee on 09/30/2022 9:01 AM Indications: diagnostic evaluation, joint swelling and pain Details: 18 G 1.5 in needle, superolateral approach  Arthrogram: No  Outcome: tolerated well, no immediate complications Procedure, treatment alternatives, risks and benefits explained, specific risks discussed. Consent was given by the patient. Immediately prior to procedure a time out was called to verify the correct patient, procedure, equipment, support staff and site/side marked as required. Patient was prepped and draped in  the usual sterile fashion.    Large Joint Inj: L knee on 09/30/2022 9:02 AM Indications: diagnostic evaluation, joint swelling and pain Details: 18 G 1.5 in needle, superolateral approach  Arthrogram: No  Medications: 5 mL lidocaine 1 %; 40 mg methylPREDNISolone acetate 40 MG/ML; 4 mL bupivacaine 0.25 % Outcome: tolerated well, no immediate complications Procedure, treatment alternatives, risks and benefits explained, specific risks discussed. Consent was given by the patient. Immediately prior to procedure a time out was called to verify the correct patient, procedure, equipment, support staff and site/side marked as required. Patient was prepped and draped in the usual sterile fashion.       Clinical Data: No additional findings.  Objective: Vital Signs: There were no vitals taken for this visit.  Physical Exam:  Constitutional: Patient appears well-developed HEENT:  Head: Normocephalic Eyes:EOM are normal Neck: Normal range of motion Cardiovascular: Normal rate Pulmonary/chest: Effort normal Neurologic: Patient is alert Skin: Skin is warm Psychiatric: Patient has normal mood and affect  Ortho Exam: Ortho exam demonstrates mild flexion contracture on the left no flex contracture on the right.  No effusion in either knee.  Collateral crucial ligaments are stable extensor mechanism intact.  Pedal pulses palpable.  No groin pain with internal/external rotation of the leg.  Flexes easily past 90 in both knees.  Specialty Comments:  No specialty comments available.  Imaging: No results found.   PMFS History: Patient Active  Problem List   Diagnosis Date Noted   Allergy    Anxiety    Arthritis    Cataract    Depression    DM (diabetes mellitus) ([place])    Dyspnea    Heart murmur    History of kidney stones    Hyperlipidemia    Kidney stones    Sleep apnea    Status post dilation of esophageal narrowing    Asthma 01/16/2020   History of transient ischemic attack  (TIA) 01/16/2020   Bilateral carotid artery stenosis 01/16/2020   Normocytic anemia 01/16/2020   Diabetes mellitus type II, non insulin dependent ([place]) 07/05/2019   Mild aortic stenosis 07/05/2019   Mitral regurgitation 07/05/2019   Pedal edema 05/06/2019   DOE (dyspnea on exertion) 05/06/2019   Hearing loss, mixed, bilateral 12/17/2018   Bilateral primary osteoarthritis of knee 10/16/2018   OSA (obstructive sleep apnea) 09/28/2018   Shoulder arthritis 02/22/2018   Primary osteoarthritis, left shoulder 12/11/2017   DDD (degenerative disc disease), lumbar 01/23/2017   History of diabetes mellitus 01/23/2017   History of depression 01/23/2017   History of asthma 01/23/2017   Dyslipidemia 01/23/2017   Age-related osteoporosis without current pathological fracture 01/23/2017   History of gastroesophageal reflux (GERD) 01/17/2017   Primary osteoarthritis of both knees 01/17/2017   Primary osteoarthritis of both feet 01/17/2017   Vitamin D deficiency 01/17/2017   Rheumatoid factor positive 12/13/2016   Primary osteoarthritis of both hands 12/13/2016   Retained orthopedic hardware 11/14/2016   Pain in left wrist 06/06/2016   Stiffness of left wrist joint 06/06/2016   Closed fracture of distal end of radius 06/05/2016   Acquired trigger finger 04/20/2016   Obstructive lung disease ([place]) 07/29/2015   H/O allergic rhinitis 07/29/2015   LPRD (laryngopharyngeal reflux disease) 07/29/2015   Chest pain 11/01/2013   Murmur 11/01/2013   DM 03/03/2010   ABDOMINAL PAIN RIGHT LOWER QUADRANT 03/03/2010   FULL INCONTINENCE OF FECES 03/03/2010   H/O adenomatous polyp of colon 2011   DECREASED HEARING 05/02/2008   Essential hypertension 05/02/2008   ESOPHAGITIS 05/02/2008   HIATAL HERNIA 05/02/2008   RECTAL INCONTINENCE 05/02/2008   SARCOIDOSIS 11/16/2007   Severe persistent asthma 11/16/2007   GERD 11/16/2007   IRRITABLE BOWEL SYNDROME 11/16/2007   Past Medical History:  Diagnosis Date    ABDOMINAL PAIN RIGHT LOWER QUADRANT 03/03/2010   Qualifier: Diagnosis of  By: Olevia Perches MD, Lowella Bandy    Acquired trigger finger 04/20/2016   Age-related osteoporosis without current pathological fracture 01/23/2017   Allergy    Anxiety    Arthritis    Asthma    Bilateral carotid artery stenosis 01/16/2020   Bilateral primary osteoarthritis of knee 10/16/2018   Cataract    bilateral and removed   Chest pain 11/01/2013   Closed fracture of distal end of radius 06/05/2016   DDD (degenerative disc disease), lumbar 01/23/2017   Depression    Diabetes mellitus type II, non insulin dependent ([facility]) 07/05/2019   Diaphragmatic hernia without mention of obstruction or gangrene    DM 03/03/2010   Qualifier: Diagnosis of  By: Tamala Julian CMA, Claiborne Billings     DM (diabetes mellitus) ([place])    DOE (dyspnea on exertion) 05/06/2019   Dyslipidemia 01/23/2017   Dyspnea    Esophageal reflux    Esophagitis, unspecified    Essential hypertension 05/02/2008   Qualifier: Diagnosis of  By: Nelson-Smith CMA (AAMA), Dottie     Full incontinence of feces 03/03/2010   Qualifier: Diagnosis of  By: Olevia Perches MD, Lowella Bandy    H/O adenomatous polyp of colon 2011   H/O allergic rhinitis 07/29/2015   Hearing loss, mixed, bilateral 12/17/2018   Heart murmur    no cardiologist, family Dr. Maryfrances Bunnell   History of asthma 01/23/2017   History of depression 01/23/2017   History of diabetes mellitus 01/23/2017   History of gastroesophageal reflux (GERD) 01/17/2017   History of kidney stones    History of transient ischemic attack (TIA) 01/16/2020   Hyperlipidemia    Incontinence of feces    Irritable bowel syndrome    Kidney stones    LPRD (laryngopharyngeal reflux disease) 07/29/2015   Mallory-Weiss tear 07/03/2022   Mild aortic stenosis 07/05/2019   Mitral regurgitation 07/05/2019   Murmur 11/01/2013   Normocytic anemia 01/16/2020   Obstructive lung disease ([facility]) 07/29/2015   OSA (obstructive sleep apnea) 09/28/2018   Pain in  left wrist 06/06/2016   Pedal edema 05/06/2019   Primary osteoarthritis of both feet 01/17/2017   Primary osteoarthritis of both hands 12/13/2016   Primary osteoarthritis of both knees 01/17/2017   Primary osteoarthritis, left shoulder 12/11/2017   Retained orthopedic hardware 11/14/2016   Rheumatoid factor positive 12/13/2016   Sarcoidosis    Severe persistent asthma 11/16/2007   Annotation: chronic Qualifier: Diagnosis of  By: Melvyn Novas MD, Christena Deem    Shoulder arthritis 02/22/2018   Sleep apnea    no cpap now   Status post dilation of esophageal narrowing    Stiffness of left wrist joint 06/06/2016   Unspecified hearing loss    Vitamin D deficiency 01/17/2017    Family History  Problem Relation Age of Onset   Esophageal cancer Father    Diabetes Mother    Diabetes Maternal Grandmother    Colon cancer Neg Hx    Rectal cancer Neg Hx    Stomach cancer Neg Hx     Past Surgical History:  Procedure Laterality Date   BLADDER SURGERY     CATARACT EXTRACTION Bilateral    HARDWARE REMOVAL Left 01/12/2017   Procedure: HARDWARE REMOVAL left wrist;  Surgeon: Leanora Cover, MD;  Location: [facility];  Service: Orthopedics;  Laterality: Left;   HEMORRHOID SURGERY     NASAL SEPTUM SURGERY     PARTIAL HYSTERECTOMY     REVERSE SHOULDER ARTHROPLASTY Left 02/22/2018   REVERSE SHOULDER ARTHROPLASTY Left 02/22/2018   Procedure: LEFT REVERSE SHOULDER ARTHROPLASTY;  Surgeon: Meredith Pel, MD;  Location: [facility];  Service: Orthopedics;  Laterality: Left;   TONSILLECTOMY AND ADENOIDECTOMY     TRANSCAROTID ARTERY REVASCULARIZATION  Left 01/17/2020   Procedure: TRANSCAROTID ARTERY REVASCULARIZATION;  Surgeon: Serafina Mitchell, MD;  Location: [facility];  Service: Vascular;  Laterality: Left;   TRIGGER FINGER RELEASE Left 05/12/2016   Procedure: RELEASE TRIGGER FINGER/A-1 PULLEY INFECTION LEFT RING TENDON SHEATH INJECT RIGHT INDEX FINGER;  Surgeon: Leanora Cover, MD;  Location: [facility];  Service: Orthopedics;  Laterality: Left;   TUBAL LIGATION     Social History   Occupational History   Occupation: retired  Tobacco Use   Smoking status: Never   Smokeless tobacco: Never  Vaping Use   Vaping Use: Never used  Substance and Sexual Activity   Alcohol use: No   Drug use: No   Sexual activity: Not on file

## 2022-10-06 DIAGNOSIS — Z23 Encounter for immunization: Secondary | ICD-10-CM | POA: Diagnosis not present

## 2022-10-06 DIAGNOSIS — D860 Sarcoidosis of lung: Secondary | ICD-10-CM | POA: Diagnosis not present

## 2022-10-06 DIAGNOSIS — K219 Gastro-esophageal reflux disease without esophagitis: Secondary | ICD-10-CM | POA: Diagnosis not present

## 2022-10-06 DIAGNOSIS — E1122 Type 2 diabetes mellitus with diabetic chronic kidney disease: Secondary | ICD-10-CM | POA: Diagnosis not present

## 2022-10-06 DIAGNOSIS — Z6827 Body mass index (BMI) 27.0-27.9, adult: Secondary | ICD-10-CM | POA: Diagnosis not present

## 2022-10-26 ENCOUNTER — Ambulatory Visit (INDEPENDENT_AMBULATORY_CARE_PROVIDER_SITE_OTHER): Payer: Medicare HMO | Admitting: *Deleted

## 2022-10-26 DIAGNOSIS — J4550 Severe persistent asthma, uncomplicated: Secondary | ICD-10-CM | POA: Diagnosis not present

## 2022-10-27 DIAGNOSIS — J4540 Moderate persistent asthma, uncomplicated: Secondary | ICD-10-CM | POA: Diagnosis not present

## 2022-10-27 DIAGNOSIS — E119 Type 2 diabetes mellitus without complications: Secondary | ICD-10-CM | POA: Diagnosis not present

## 2022-11-07 DIAGNOSIS — Z86010 Personal history of colonic polyps: Secondary | ICD-10-CM | POA: Diagnosis not present

## 2022-11-07 DIAGNOSIS — R198 Other specified symptoms and signs involving the digestive system and abdomen: Secondary | ICD-10-CM

## 2022-11-07 DIAGNOSIS — K226 Gastro-esophageal laceration-hemorrhage syndrome: Secondary | ICD-10-CM | POA: Diagnosis not present

## 2022-11-07 HISTORY — DX: Other specified symptoms and signs involving the digestive system and abdomen: R19.8

## 2022-11-08 ENCOUNTER — Other Ambulatory Visit: Payer: Self-pay

## 2022-11-08 DIAGNOSIS — H90A31 Mixed conductive and sensorineural hearing loss, unilateral, right ear with restricted hearing on the contralateral side: Secondary | ICD-10-CM

## 2022-11-08 HISTORY — DX: Mixed conductive and sensorineural hearing loss, unilateral, right ear with restricted hearing on the contralateral side: H90.A31

## 2022-11-14 ENCOUNTER — Ambulatory Visit: Payer: Medicare HMO | Admitting: Allergy and Immunology

## 2022-11-14 VITALS — BP 156/78 | HR 56 | Resp 20

## 2022-11-14 DIAGNOSIS — K219 Gastro-esophageal reflux disease without esophagitis: Secondary | ICD-10-CM

## 2022-11-14 DIAGNOSIS — J4550 Severe persistent asthma, uncomplicated: Secondary | ICD-10-CM

## 2022-11-14 DIAGNOSIS — G4733 Obstructive sleep apnea (adult) (pediatric): Secondary | ICD-10-CM | POA: Diagnosis not present

## 2022-11-14 DIAGNOSIS — Z862 Personal history of diseases of the blood and blood-forming organs and certain disorders involving the immune mechanism: Secondary | ICD-10-CM

## 2022-11-14 DIAGNOSIS — J3089 Other allergic rhinitis: Secondary | ICD-10-CM | POA: Diagnosis not present

## 2022-11-14 NOTE — Patient Instructions (Addendum)
  1. Continue tezepelumab injections    2. Continue Trelegy 200 - 1 inhalation 1 time per day  3. Continue Flonase - one spray each nostril 1 time per day.    4. Continue omeprazole 40 twice a day + Famotidine 40 mg in evening  5. If needed:   A. nasal saline   B. Albuterol HFA or Duoneb   C. OTC Mucinex DM 1-2 tablet 1-2 times per day   D. ipratropium 0.06% nasal spray - 1-2 sprays each nostril every 6 hours   6. Obtain high resolution chest CT scan  7. Return to clinic in 6 months or earlier if problem  8. Discuss with sleep pulmonologist about CPAP machine dysfunction

## 2022-11-14 NOTE — Progress Notes (Unsigned)
[facility]   Follow-up Note  Referring Provider: Serita Grammes, MD Primary Provider: Serita Grammes, MD Date of Office Visit: 11/14/2022  Subjective:   [name] (DOB: 1946-10-12) is a 76 y.o. female who returns to the [facility] on 11/14/2022 in re-evaluation of the following:  HPI: [name] returns to this clinic in evaluation of asthma, fibrotic lung disease from sarcoidosis, allergic rhinitis, LPR.  Her last visit to this clinic was 28 September 2022.  The big issue for [name] at this point in time is that she has a chronic cough.  When I last saw her in this clinic she and her husband apparently had a viral respiratory tract infection and she does not really think that she ever completely recovered from that infection.  She still intermittently makes some sputum that is clear or sometimes beige color but a lot of times she just has a dry cough.  She was already given a systemic steroid and an antibiotic by her primary care doctor.  She continues on a large collection of anti-inflammatory agents for airway including anti-TSLP antibody.  She has had very little problems with her upper airways at this point in time.  She has had very little problems with reflux at this point in time.  She did visit with GI and apparently there is a discussion about having a colonoscopy completed for "constipation".  She informs me that she is not using her CPAP machine because there is "something wrong with it".  Allergies as of 11/14/2022       Reactions   Codeine Nausea And Vomiting   Sulfa Antibiotics Other (See Comments)   Allergic per pt's mother         Medication List    acetaminophen 325 MG tablet Commonly known as: TYLENOL Take 2 tablets (650 mg total) by mouth every 6 (six) hours as needed for mild pain (or Fever >/= 101).   albuterol 108 (90 Base) MCG/ACT inhaler Commonly known as: VENTOLIN HFA Inhale  2 puffs into the lungs every 6 (six) hours as needed for wheezing or shortness of breath.   alendronate 70 MG tablet Commonly known as: FOSAMAX Take 70 mg by mouth once a week.   aspirin EC 81 MG tablet Take 1 tablet by mouth daily.   atorvastatin 40 MG tablet Commonly known as: LIPITOR Take 40 mg by mouth at bedtime.   citalopram 20 MG tablet Commonly known as: CELEXA Take 20 mg by mouth daily.   dicyclomine 20 MG tablet Commonly known as: BENTYL Take 20 mg by mouth as needed for spasms.   EPINEPHrine 0.3 mg/0.3 mL Soaj injection Commonly known as: EPI-PEN Use as directed for life-threatening allergic reaction.   famotidine 40 MG tablet Commonly known as: PEPCID TAKE ONE TABLET BY MOUTH EVERYDAY AT BEDTIME   fluticasone 50 MCG/ACT nasal spray Commonly known as: FLONASE Place 1-2 sprays into both nostrils as needed for allergies or rhinitis.   ipratropium 0.06 % nasal spray Commonly known as: ATROVENT Can use one to two sprays in each nostril every six hours if needed.   ipratropium-albuterol 0.5-2.5 (3) MG/3ML Soln Commonly known as: DUONEB Take 3 mLs by nebulization every 6 (six) hours as needed for wheezing or shortness of breath (shortness of breath/wheezing).   IRON PO Take 1 tablet by mouth daily.   levocetirizine 5 MG tablet Commonly known as: XYZAL Take 5 mg by mouth at bedtime.   metoprolol  succinate 25 MG 24 hr tablet Commonly known as: TOPROL-XL Take 25 mg by mouth at bedtime.   montelukast 10 MG tablet Commonly known as: SINGULAIR Take 10 mg by mouth daily.   omeprazole 40 MG capsule Commonly known as: PRILOSEC Take 40 mg by mouth 2 (two) times daily.   Ozempic (0.25 or 0.5 MG/DOSE) 2 MG/1.5ML Sopn Generic drug: Semaglutide(0.25 or 0.'5MG'$ /DOS) Inject 0.25 mg into the skin every 7 (seven) days.   pioglitazone 15 MG tablet Commonly known as: ACTOS Take 15 mg by mouth daily.   PROBIOTIC DAILY PO Take 1 capsule by mouth daily.    Telmisartan-amLODIPine 80-5 MG Tabs Take 1 tablet by mouth daily.   Trelegy Ellipta 200-62.5-25 MCG/ACT Aepb Generic drug: Fluticasone-Umeclidin-Vilant Inhale 1 puff into the lungs every morning.    Past Medical History:  Diagnosis Date   ABDOMINAL PAIN RIGHT LOWER QUADRANT 03/03/2010   Qualifier: Diagnosis of  By: Olevia Perches MD, Lowella Bandy    Acquired trigger finger 04/20/2016   Age-related osteoporosis without current pathological fracture 01/23/2017   Allergy    Anxiety    Arthritis    Asthma    Bilateral carotid artery stenosis 01/16/2020   Bilateral primary osteoarthritis of knee 10/16/2018   Cataract    bilateral and removed   Change in bowel function 11/07/2022   Chest pain 11/01/2013   Closed fracture of distal end of radius 06/05/2016   DDD (degenerative disc disease), lumbar 01/23/2017   Depression    Diabetes mellitus type II, non insulin dependent ([place]) 07/05/2019   Diaphragmatic hernia without mention of obstruction or gangrene    DM 03/03/2010   Qualifier: Diagnosis of  By: Tamala Julian CMA, Claiborne Billings     DM (diabetes mellitus) ([place])    DOE (dyspnea on exertion) 05/06/2019   Dyslipidemia 01/23/2017   Dyspnea    Esophageal reflux    Esophagitis, unspecified    Essential hypertension 05/02/2008   Qualifier: Diagnosis of  By: Nelson-Smith CMA (AAMA), Dottie     Full incontinence of feces 03/03/2010   Qualifier: Diagnosis of  By: Olevia Perches MD, Lowella Bandy    H/O adenomatous polyp of colon 2011   H/O allergic rhinitis 07/29/2015   Hearing loss, mixed, bilateral 12/17/2018   Heart murmur    no cardiologist, family Dr. Maryfrances Bunnell   History of asthma 01/23/2017   History of depression 01/23/2017   History of diabetes mellitus 01/23/2017   History of gastroesophageal reflux (GERD) 01/17/2017   History of kidney stones    History of transient ischemic attack (TIA) 01/16/2020   Hyperlipidemia    Incontinence of feces    Irritable bowel syndrome    Kidney stones    LPRD  (laryngopharyngeal reflux disease) 07/29/2015   Mallory-Weiss tear 07/03/2022   Mild aortic stenosis 07/05/2019   Mitral regurgitation 07/05/2019   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 Here for medical clearance for the right ear. Noted to have recent worsening of her right ear on audiogram at an outside facility.  Denies any pain.  In general, she is functioning well with her current hearing aid though she is looking at getting a new one.  Left ear with profound loss from prior surgery.  Audio   Murmur 11/01/2013   Normocytic anemia 01/16/2020   Obstructive lung disease ([facility] [place]) 07/29/2015   OSA (obstructive sleep apnea) 09/28/2018   Pain  in left wrist 06/06/2016   Pedal edema 05/06/2019   Primary osteoarthritis of both feet 01/17/2017   Primary osteoarthritis of both hands 12/13/2016   Primary osteoarthritis of both knees 01/17/2017   Primary osteoarthritis, left shoulder 12/11/2017   Retained orthopedic hardware 11/14/2016   Rheumatoid factor positive 12/13/2016   Sarcoidosis    Severe persistent asthma 11/16/2007   Annotation: chronic Qualifier: Diagnosis of  By: Melvyn Novas MD, Christena Deem    Shoulder arthritis 02/22/2018   Sleep apnea    no cpap now   Status post dilation of esophageal narrowing    Stiffness of left wrist joint 06/06/2016   Unspecified hearing loss    Vitamin D deficiency 01/17/2017    Past Surgical History:  Procedure Laterality Date   BLADDER SURGERY     CATARACT EXTRACTION Bilateral    HARDWARE REMOVAL Left 01/12/2017   Procedure: HARDWARE REMOVAL left wrist;  Surgeon: Leanora Cover, MD;  Location: [facility];  Service: Orthopedics;  Laterality: Left;   HEMORRHOID SURGERY     NASAL SEPTUM SURGERY     PARTIAL HYSTERECTOMY     REVERSE SHOULDER ARTHROPLASTY Left 02/22/2018   REVERSE SHOULDER  ARTHROPLASTY Left 02/22/2018   Procedure: LEFT REVERSE SHOULDER ARTHROPLASTY;  Surgeon: Meredith Pel, MD;  Location: [facility];  Service: Orthopedics;  Laterality: Left;   TONSILLECTOMY AND ADENOIDECTOMY     TRANSCAROTID ARTERY REVASCULARIZATION  Left 01/17/2020   Procedure: TRANSCAROTID ARTERY REVASCULARIZATION;  Surgeon: Serafina Mitchell, MD;  Location: [facility];  Service: Vascular;  Laterality: Left;   TRIGGER FINGER RELEASE Left 05/12/2016   Procedure: RELEASE TRIGGER FINGER/A-1 PULLEY INFECTION LEFT RING TENDON SHEATH INJECT RIGHT INDEX FINGER;  Surgeon: Leanora Cover, MD;  Location: [facility];  Service: Orthopedics;  Laterality: Left;   TUBAL LIGATION      Review of systems negative except as noted in HPI / PMHx or noted below:  Review of Systems  Constitutional: Negative.   HENT: Negative.    Eyes: Negative.   Respiratory: Negative.    Cardiovascular: Negative.   Gastrointestinal: Negative.   Genitourinary: Negative.   Musculoskeletal: Negative.   Skin: Negative.   Neurological: Negative.   Endo/Heme/Allergies: Negative.   Psychiatric/Behavioral: Negative.       Objective:   Vitals:   11/14/22 1132 11/14/22 1146  BP: (!) 168/52 (!) 156/78  Pulse: (!) 56   Resp: 20   SpO2: 93%           Physical Exam Constitutional:      Appearance: She is not diaphoretic.  HENT:     Head: Normocephalic.     Right Ear: Tympanic membrane, ear canal and external ear normal.     Left Ear: Tympanic membrane, ear canal and external ear normal.     Nose: Nose normal. No mucosal edema or rhinorrhea.     Mouth/Throat:     Pharynx: Uvula midline. No oropharyngeal exudate.  Eyes:     Conjunctiva/sclera: Conjunctivae normal.  Neck:     Thyroid: No thyromegaly.     Trachea: Trachea normal. No tracheal tenderness or tracheal deviation.  Cardiovascular:     Rate and Rhythm: Normal rate and regular rhythm.     Heart sounds: Normal heart sounds, S1 normal and S2 normal. No  murmur heard. Pulmonary:     Effort: No respiratory distress.     Breath sounds: Normal breath sounds. No stridor. No wheezing (Expiratory wheezing posterior lung fields.  Expiratory throat wheezing.) or rales.  Lymphadenopathy:  Head:     Right side of head: No tonsillar adenopathy.     Left side of head: No tonsillar adenopathy.     Cervical: No cervical adenopathy.  Skin:    Findings: No erythema or rash.     Nails: There is no clubbing.  Neurological:     Mental Status: She is alert.     Diagnostics:    Spirometry was performed and demonstrated an FEV1 of 0.90 at 50 % of predicted.  Results of a high resolution chest CT scan obtained 27 September 2018 identifies the following:  1. 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 Findings remain most prominent in the left upper lobe, and have mildly progressed at the lung bases in the interval since 2016. 2. Mild patchy air trapping in both lungs, indicative of small airways disease, also compatible with sarcoidosis. 3. No thoracic adenopathy. 4. Left main and 1 vessel coronary atherosclerosis. 5. Small hiatal hernia.  Assessment and Plan:   1. Not well controlled severe persistent asthma   2. History of sarcoidosis   3. Other allergic rhinitis   4. LPRD (laryngopharyngeal reflux disease)   5. OSA (obstructive sleep apnea)    1. Continue tezepelumab injections    2. Continue Trelegy 200 - 1 inhalation 1 time per day  3. Continue Flonase - one spray each nostril 1 time per day.    4. Continue omeprazole 40 twice a day + Famotidine 40 mg in evening  5. If needed:   A. nasal saline   B. Albuterol HFA or Duoneb   C. OTC Mucinex DM 1-2 tablet 1-2 times per day   D. ipratropium 0.06% nasal spray  - 1-2 sprays each nostril every 6 hours   6. Obtain high resolution chest CT scan  7. Return to clinic in 6 months or earlier if problem  8. Discuss with sleep pulmonologist about CPAP machine dysfunction  I think that [name] needs an imaging of her lower airway to help define exactly what is going on is giving rise to her chronic cough.  Certainly this could just be asthma and reflux induced respiratory disease but given her previous history of sarcoidosis with fibrosis and some nodules I think it is worthwhile to image her airway.  She will continue on a large collection of anti-inflammatory agents for airway as noted above and continue to treat her reflux.  I have encouraged her to touch base with her sleep pulmonologist about her inability to use her CPAP machine.  I am not really sure that there is anything wrong with her CPAP machine but certainly she is having problems using this device to treat her significant sleep apnea.  Allena Katz, MD Allergy / [facility]ology [facility]

## 2022-11-15 ENCOUNTER — Encounter: Payer: Self-pay | Admitting: Allergy and Immunology

## 2022-11-16 ENCOUNTER — Telehealth: Payer: Self-pay

## 2022-11-16 NOTE — Telephone Encounter (Signed)
Left message to call the office.  Please let [name] know that she has been scheduled for her Chest CT Scan on Friday, December 22 at [facility], needing to check in at 8:00 am at [facility].    Note: Prior authorization # [id number], Valid 11-14-22 to 05-13-23 Order Form faxed to [facility]

## 2022-11-16 NOTE — Telephone Encounter (Signed)
Informed of appt info.

## 2022-11-18 ENCOUNTER — Encounter: Payer: Self-pay | Admitting: Cardiology

## 2022-11-18 ENCOUNTER — Ambulatory Visit: Payer: Medicare HMO | Attending: Cardiology | Admitting: Cardiology

## 2022-11-18 VITALS — BP 134/60 | HR 77 | Ht 60.0 in | Wt 149.4 lb

## 2022-11-18 DIAGNOSIS — J90 Pleural effusion, not elsewhere classified: Secondary | ICD-10-CM | POA: Diagnosis not present

## 2022-11-18 DIAGNOSIS — D869 Sarcoidosis, unspecified: Secondary | ICD-10-CM | POA: Diagnosis not present

## 2022-11-18 DIAGNOSIS — I350 Nonrheumatic aortic (valve) stenosis: Secondary | ICD-10-CM

## 2022-11-18 DIAGNOSIS — I10 Essential (primary) hypertension: Secondary | ICD-10-CM

## 2022-11-18 DIAGNOSIS — I340 Nonrheumatic mitral (valve) insufficiency: Secondary | ICD-10-CM

## 2022-11-18 DIAGNOSIS — R918 Other nonspecific abnormal finding of lung field: Secondary | ICD-10-CM | POA: Diagnosis not present

## 2022-11-18 NOTE — Progress Notes (Signed)
Cardiology Office Note:    Date:  11/18/2022   ID:  [name], DOB March 04, 1946, MRN [id number]  PCP:  [name] Grammes, [name]  Cardiologist:  Jenean Lindau, [name]   Referring [name]: [name] Grammes, [name]    ASSESSMENT:    1. Essential hypertension   2. Mild aortic stenosis   3. Mitral valve insufficiency, unspecified etiology    PLAN:    In order of problems listed above:  Coronary artery calcification: Secondary prevention stressed with the patient.  Importance of compliance with diet medication stressed and she vocalized understanding. Essential hypertension: Blood pressure stable and diet was emphasized.  Lifestyle modifications stressed. Mixed dyslipidemia: On lipid-lowering medications followed by primary care. Diabetes mellitus and obesity: Weight reduction stressed diabetes education was given. Patient will be seen in follow-up appointment in 6 months or earlier if the patient has any concerns    Medication Adjustments/Labs and Tests Ordered: Current medicines are reviewed at length with the patient today.  Concerns regarding medicines are outlined above.  No orders of the defined types were placed in this encounter.  No orders of the defined types were placed in this encounter.    No chief complaint on file.    History of Present Illness:    [name] is a 76 y.o. female.  Patient has past medical history of coronary artery calcification, essential hypertension, mixed dyslipidemia and diabetes mellitus.  She denies any problems at this time and takes care of activities of daily living.  She is seeing an allergy specialist for some shortness of breath.  She uses inhalers with relief.  She had a CT scan this morning recommended by allergy doctor but the report is pending.  At the time of my evaluation, the patient is alert awake oriented and in no distress.  Past Medical History:  Diagnosis Date   ABDOMINAL PAIN RIGHT LOWER QUADRANT 03/03/2010    Qualifier: Diagnosis of  By: [name], [name]    Acquired trigger finger 04/20/2016   Age-related osteoporosis without current pathological fracture 01/23/2017   Allergy    Anxiety    Arthritis    Asthma    Bilateral carotid artery stenosis 01/16/2020   Bilateral primary osteoarthritis of knee 10/16/2018   Cataract    bilateral and removed   Change in bowel function 11/07/2022   Chest pain 11/01/2013   Closed fracture of distal end of radius 06/05/2016   DDD (degenerative disc disease), lumbar 01/23/2017   Depression    Diabetes mellitus type II, non insulin dependent ([place]) 07/05/2019   Diaphragmatic hernia without mention of obstruction or gangrene    DM 03/03/2010   Qualifier: Diagnosis of  By: [name], [name]     DM (diabetes mellitus) ([place])    DOE (dyspnea on exertion) 05/06/2019   Dyslipidemia 01/23/2017   Dyspnea    Esophageal reflux    Esophagitis, unspecified    Essential hypertension 05/02/2008   Qualifier: Diagnosis of  By: [name] (AAMA), [name]     Full incontinence of feces 03/03/2010   Qualifier: Diagnosis of  By: [name], [name]    H/O adenomatous polyp of colon 2011   H/O allergic rhinitis 07/29/2015   Hearing loss, mixed, bilateral 12/17/2018   Heart murmur    no cardiologist, family Dr. Maryfrances [name]   History of asthma 01/23/2017   History of depression 01/23/2017   History of diabetes mellitus 01/23/2017   History of gastroesophageal reflux (GERD) 01/17/2017   History of kidney stones  History of transient ischemic attack (TIA) 01/16/2020   Hyperlipidemia    Incontinence of feces    Irritable bowel syndrome    Kidney stones    LPRD (laryngopharyngeal reflux disease) 07/29/2015   Mallory-Weiss tear 07/03/2022   Mild aortic stenosis 07/05/2019   Mitral regurgitation 07/05/2019   Mixed conductive and sensorineural hearing loss of right ear with restricted hearing of left ear 11/08/2022   Last Assessment & Plan: Formatting of this note  might be different from the original. Here for medical clearance for the right ear. Noted to have recent worsening of her right ear on audiogram at an outside facility.  Denies any pain.  In general, she is functioning well with her current hearing aid though she is looking at getting a new one.  Left ear with profound loss from prior surgery.  Audio   Murmur 11/01/2013   Normocytic anemia 01/16/2020   Obstructive lung disease ([place]) 07/29/2015   OSA (obstructive sleep apnea) 09/28/2018   Pain in left wrist 06/06/2016   Pedal edema 05/06/2019   Primary osteoarthritis of both feet 01/17/2017   Primary osteoarthritis of both hands 12/13/2016   Primary osteoarthritis of both knees 01/17/2017   Primary osteoarthritis, left shoulder 12/11/2017   Retained orthopedic hardware 11/14/2016   Rheumatoid factor positive 12/13/2016   Sarcoidosis    Severe persistent asthma 11/16/2007   Annotation: chronic Qualifier: Diagnosis of  By: [name], [name]    Shoulder arthritis 02/22/2018   Sleep apnea    no cpap now   Status post dilation of esophageal narrowing    Stiffness of left wrist joint 06/06/2016   Unspecified hearing loss    Vitamin D deficiency 01/17/2017    Past Surgical History:  Procedure Laterality Date   BLADDER SURGERY     CATARACT EXTRACTION Bilateral    HARDWARE REMOVAL Left 01/12/2017   Procedure: HARDWARE REMOVAL left wrist;  Surgeon: [name] Cover, [name];  Location: [facility];  Service: Orthopedics;  Laterality: Left;   HEMORRHOID SURGERY     NASAL SEPTUM SURGERY     PARTIAL HYSTERECTOMY     REVERSE SHOULDER ARTHROPLASTY Left 02/22/2018   REVERSE SHOULDER ARTHROPLASTY Left 02/22/2018   Procedure: LEFT REVERSE SHOULDER ARTHROPLASTY;  Surgeon: [name] Pel, [name];  Location: [facility];  Service: Orthopedics;  Laterality: Left;   TONSILLECTOMY AND ADENOIDECTOMY     TRANSCAROTID ARTERY REVASCULARIZATION  Left 01/17/2020   Procedure: TRANSCAROTID ARTERY  REVASCULARIZATION;  Surgeon: [name] Mitchell, [name];  Location: [facility];  Service: Vascular;  Laterality: Left;   TRIGGER FINGER RELEASE Left 05/12/2016   Procedure: RELEASE TRIGGER FINGER/A-1 PULLEY INFECTION LEFT RING TENDON SHEATH INJECT RIGHT INDEX FINGER;  Surgeon: [name] Cover, [name];  Location: [facility];  Service: Orthopedics;  Laterality: Left;   TUBAL LIGATION      Current Medications: Current Meds  Medication Sig   acetaminophen (TYLENOL) 325 MG tablet Take 2 tablets (650 mg total) by mouth every 6 (six) hours as needed for mild pain (or Fever >/= 101).   albuterol (VENTOLIN HFA) 108 (90 Base) MCG/ACT inhaler Inhale 2 puffs into the lungs every 6 (six) hours as needed for wheezing or shortness of breath.   alendronate (FOSAMAX) 70 MG tablet Take 70 mg by mouth once a week.   aspirin 81 MG EC tablet Take 1 tablet by mouth daily.   atorvastatin (LIPITOR) 40 MG tablet Take 40 mg by mouth at bedtime.   citalopram (CELEXA) 20 MG tablet Take 20  mg by mouth daily.   dicyclomine (BENTYL) 20 MG tablet Take 20 mg by mouth as needed for spasms.   EPINEPHrine 0.3 mg/0.3 mL IJ SOAJ injection Use as directed for life-threatening allergic reaction.   famotidine (PEPCID) 40 MG tablet TAKE ONE TABLET BY MOUTH EVERYDAY AT BEDTIME   Ferrous Sulfate (IRON PO) Take 1 tablet by mouth daily.   fluticasone (FLONASE) 50 MCG/ACT nasal spray Place 1-2 sprays into both nostrils as needed for allergies or rhinitis.   Fluticasone-Umeclidin-Vilant (TRELEGY ELLIPTA) 200-62.5-25 MCG/INH AEPB Inhale 1 puff into the lungs every morning.   ipratropium (ATROVENT) 0.06 % nasal spray Can use one to two sprays in each nostril every six hours if needed.   ipratropium-albuterol (DUONEB) 0.5-2.5 (3) MG/3ML SOLN Take 3 mLs by nebulization every 6 (six) hours as needed for wheezing or shortness of breath (shortness of breath/wheezing).   levocetirizine (XYZAL) 5 MG tablet Take 5 mg by mouth at bedtime.    montelukast  (SINGULAIR) 10 MG tablet Take 10 mg by mouth daily.   omeprazole (PRILOSEC) 40 MG capsule Take 40 mg by mouth 2 (two) times daily.   pioglitazone (ACTOS) 15 MG tablet Take 15 mg by mouth daily.   Probiotic Product (PROBIOTIC DAILY PO) Take 1 capsule by mouth daily.    Semaglutide,0.25 or 0.'5MG'$ /DOS, (OZEMPIC, 0.25 OR 0.5 MG/DOSE,) 2 MG/1.5ML SOPN Inject 0.25 mg into the skin every 7 (seven) days.   Telmisartan-amLODIPine 80-5 MG TABS Take 1 tablet by mouth daily.   Current Facility-Administered Medications for the 11/18/22 encounter (Office Visit) with Delmos Velaquez, Reita Cliche, [name]  Medication   tezepelumab-ekko (TEZSPIRE) 210 MG/1.91ML syringe 210 mg     Allergies:   Codeine and Sulfa antibiotics   Social History   Socioeconomic History   Marital status: Married    Spouse name: Not on file   Number of children: 2   Years of education: Not on file   Highest education level: Not on file  Occupational History   Occupation: retired  Tobacco Use   Smoking status: Never   Smokeless tobacco: Never  Vaping Use   Vaping Use: Never used  Substance and Sexual Activity   Alcohol use: No   Drug use: No   Sexual activity: Not on file  Other Topics Concern   Not on file  Social History Narrative   Daily caffeine use.    Social Determinants of Health   Financial Resource Strain: Not on file  Food Insecurity: Not on file  Transportation Needs: Not on file  Physical Activity: Not on file  Stress: Not on file  Social Connections: Not on file     Family History: The patient's family history includes Diabetes in her maternal grandmother and mother; Esophageal cancer in her father. There is no history of Colon cancer, Rectal cancer, or Stomach cancer.  ROS:   Please see the history of present illness.    All other systems reviewed and are negative.  EKGs/Labs/Other Studies Reviewed:    The following studies were reviewed today: EKG reveals sinus rhythm and nonspecific ST  changes   Recent Labs: No results found for requested labs within last 365 days.  Recent Lipid Panel    Component Value Date/Time   CHOL 139 07/21/2020 0856   TRIG 92 07/21/2020 0856   HDL 59 07/21/2020 0856   CHOLHDL 2.4 07/21/2020 0856   LDLCALC 63 07/21/2020 0856    Physical Exam:    VS:  BP 134/60   Pulse 77  Ht 5' (1.524 m)   Wt 149 lb 6.4 oz (67.8 kg)   SpO2 96%   BMI 29.18 kg/m     Wt Readings from Last 3 Encounters:  11/18/22 149 lb 6.4 oz (67.8 kg)  09/28/22 150 lb 12.8 oz (68.4 kg)  12/28/21 166 lb (75.3 kg)     GEN: Patient is in no acute distress HEENT: Normal NECK: No JVD; No carotid bruits LYMPHATICS: No lymphadenopathy CARDIAC: Hear sounds regular, 2/6 systolic murmur at the apex. RESPIRATORY:  Clear to auscultation without rales, wheezing or rhonchi  ABDOMEN: Soft, non-tender, non-distended MUSCULOSKELETAL:  No edema; No deformity  SKIN: Warm and dry NEUROLOGIC:  Alert and oriented x 3 PSYCHIATRIC:  Normal affect   Signed, Jenean Lindau, [name]  11/18/2022 3:16 PM    [place]

## 2022-11-18 NOTE — Patient Instructions (Signed)
Medication Instructions:  Your physician recommends that you continue on your current medications as directed. Please refer to the Current Medication list given to you today.  *If you need a refill on your cardiac medications before your next appointment, please call your pharmacy*   Lab Work: None Ordered If you have labs (blood work) drawn today and your tests are completely normal, you will receive your results only by: [place] (if you have MyChart) OR A paper copy in the mail If you have any lab test that is abnormal or we need to change your treatment, we will call you to review the results.   Testing/Procedures: None Ordered   Follow-Up: At [facility], you and your health needs are our priority.  As part of our continuing mission to provide you with exceptional heart care, we have created designated Provider Care Teams.  These Care Teams include your primary Cardiologist (physician) and Advanced Practice Providers (APPs -  Physician Assistants and Nurse Practitioners) who all work together to provide you with the care you need, when you need it.  We recommend signing up for the patient portal called "MyChart".  Sign up information is provided on this After Visit Summary.  MyChart is used to connect with patients for Virtual Visits (Telemedicine).  Patients are able to view lab/test results, encounter notes, upcoming appointments, etc.  Non-urgent messages can be sent to your provider as well.   To learn more about what you can do with MyChart, go to [email or web address].    Your next appointment:   6 month(s)  The format for your next appointment:   In Person  Provider:   Jyl Heinz, MD    Other Instructions NA

## 2022-11-19 DIAGNOSIS — R0602 Shortness of breath: Secondary | ICD-10-CM | POA: Diagnosis not present

## 2022-11-19 DIAGNOSIS — R051 Acute cough: Secondary | ICD-10-CM | POA: Diagnosis not present

## 2022-11-19 DIAGNOSIS — R509 Fever, unspecified: Secondary | ICD-10-CM | POA: Diagnosis not present

## 2022-11-21 DIAGNOSIS — Z79899 Other long term (current) drug therapy: Secondary | ICD-10-CM | POA: Diagnosis not present

## 2022-11-21 DIAGNOSIS — Z8249 Family history of ischemic heart disease and other diseases of the circulatory system: Secondary | ICD-10-CM | POA: Diagnosis not present

## 2022-11-21 DIAGNOSIS — R197 Diarrhea, unspecified: Secondary | ICD-10-CM | POA: Diagnosis not present

## 2022-11-21 DIAGNOSIS — E119 Type 2 diabetes mellitus without complications: Secondary | ICD-10-CM | POA: Diagnosis not present

## 2022-11-21 DIAGNOSIS — K219 Gastro-esophageal reflux disease without esophagitis: Secondary | ICD-10-CM | POA: Diagnosis not present

## 2022-11-21 DIAGNOSIS — E785 Hyperlipidemia, unspecified: Secondary | ICD-10-CM | POA: Diagnosis not present

## 2022-11-21 DIAGNOSIS — Z8719 Personal history of other diseases of the digestive system: Secondary | ICD-10-CM | POA: Diagnosis not present

## 2022-11-21 DIAGNOSIS — I10 Essential (primary) hypertension: Secondary | ICD-10-CM | POA: Diagnosis not present

## 2022-11-21 DIAGNOSIS — K226 Gastro-esophageal laceration-hemorrhage syndrome: Secondary | ICD-10-CM | POA: Diagnosis not present

## 2022-11-21 DIAGNOSIS — J189 Pneumonia, unspecified organism: Secondary | ICD-10-CM | POA: Diagnosis not present

## 2022-11-21 DIAGNOSIS — J159 Unspecified bacterial pneumonia: Secondary | ICD-10-CM | POA: Diagnosis not present

## 2022-11-21 DIAGNOSIS — J90 Pleural effusion, not elsewhere classified: Secondary | ICD-10-CM | POA: Diagnosis not present

## 2022-11-21 DIAGNOSIS — Z66 Do not resuscitate: Secondary | ICD-10-CM | POA: Diagnosis not present

## 2022-11-21 DIAGNOSIS — J4550 Severe persistent asthma, uncomplicated: Secondary | ICD-10-CM | POA: Diagnosis not present

## 2022-11-21 DIAGNOSIS — R0602 Shortness of breath: Secondary | ICD-10-CM | POA: Diagnosis not present

## 2022-11-21 DIAGNOSIS — G4733 Obstructive sleep apnea (adult) (pediatric): Secondary | ICD-10-CM | POA: Diagnosis not present

## 2022-11-21 DIAGNOSIS — R69 Illness, unspecified: Secondary | ICD-10-CM | POA: Diagnosis not present

## 2022-11-21 DIAGNOSIS — R918 Other nonspecific abnormal finding of lung field: Secondary | ICD-10-CM | POA: Diagnosis not present

## 2022-11-21 DIAGNOSIS — Z20822 Contact with and (suspected) exposure to covid-19: Secondary | ICD-10-CM | POA: Diagnosis not present

## 2022-11-21 DIAGNOSIS — I080 Rheumatic disorders of both mitral and aortic valves: Secondary | ICD-10-CM | POA: Diagnosis not present

## 2022-11-21 DIAGNOSIS — R000 Tachycardia, unspecified: Secondary | ICD-10-CM | POA: Diagnosis not present

## 2022-11-21 DIAGNOSIS — D869 Sarcoidosis, unspecified: Secondary | ICD-10-CM | POA: Diagnosis not present

## 2022-11-21 DIAGNOSIS — E876 Hypokalemia: Secondary | ICD-10-CM | POA: Diagnosis not present

## 2022-11-21 DIAGNOSIS — Z833 Family history of diabetes mellitus: Secondary | ICD-10-CM | POA: Diagnosis not present

## 2022-11-21 DIAGNOSIS — J9811 Atelectasis: Secondary | ICD-10-CM | POA: Diagnosis not present

## 2022-11-23 ENCOUNTER — Ambulatory Visit: Payer: Medicare HMO

## 2022-11-24 ENCOUNTER — Ambulatory Visit (INDEPENDENT_AMBULATORY_CARE_PROVIDER_SITE_OTHER): Payer: Medicare HMO | Admitting: *Deleted

## 2022-11-24 DIAGNOSIS — R197 Diarrhea, unspecified: Secondary | ICD-10-CM | POA: Diagnosis not present

## 2022-11-24 DIAGNOSIS — J189 Pneumonia, unspecified organism: Secondary | ICD-10-CM | POA: Diagnosis not present

## 2022-11-24 DIAGNOSIS — N179 Acute kidney failure, unspecified: Secondary | ICD-10-CM | POA: Diagnosis not present

## 2022-11-24 DIAGNOSIS — Z20822 Contact with and (suspected) exposure to covid-19: Secondary | ICD-10-CM | POA: Diagnosis not present

## 2022-11-24 DIAGNOSIS — J4550 Severe persistent asthma, uncomplicated: Secondary | ICD-10-CM

## 2022-11-27 DIAGNOSIS — J4540 Moderate persistent asthma, uncomplicated: Secondary | ICD-10-CM | POA: Diagnosis not present

## 2022-11-27 DIAGNOSIS — E119 Type 2 diabetes mellitus without complications: Secondary | ICD-10-CM | POA: Diagnosis not present

## 2022-12-05 NOTE — Addendum Note (Signed)
Addended by: Guy Franco on: 12/05/2022 10:10 AM   Modules accepted: Orders

## 2022-12-06 ENCOUNTER — Telehealth: Payer: Self-pay | Admitting: *Deleted

## 2022-12-06 NOTE — Telephone Encounter (Signed)
Left a message for [name] to call back. Need to inform her that her Chest CT showed possible progression of her previous lung issue. Dr. Neldon Mc wants her to see [facility].

## 2022-12-13 ENCOUNTER — Ambulatory Visit: Payer: Medicare HMO | Admitting: Podiatry

## 2022-12-13 DIAGNOSIS — M79675 Pain in left toe(s): Secondary | ICD-10-CM

## 2022-12-13 DIAGNOSIS — B351 Tinea unguium: Secondary | ICD-10-CM | POA: Diagnosis not present

## 2022-12-13 DIAGNOSIS — M79674 Pain in right toe(s): Secondary | ICD-10-CM | POA: Diagnosis not present

## 2022-12-13 DIAGNOSIS — E119 Type 2 diabetes mellitus without complications: Secondary | ICD-10-CM

## 2022-12-13 NOTE — Progress Notes (Signed)
  Subjective:  Patient ID: [name], female    DOB: January 16, 1946,  MRN: [id number]  Chief Complaint  Patient presents with   Nail Problem    Diabetic Foot Care.     77 y.o. female presents with the above complaint. History confirmed with patient. Patient presenting with pain related to dystrophic thickened elongated nails. Patient is unable to trim own nails related to nail dystrophy and/or mobility issues. Patient does have a history of T2DM without peripheral neuropathy.   Objective:  Physical Exam: warm, good capillary refill, DP and PT pulses 2/4 bilateral nail exam onychomycosis of the toenails, onycholysis, and dystrophic nails DP pulses palpable, PT pulses palpable, and protective sensation intact Left Foot:  Pain with palpation of nails due to elongation and dystrophic growth.  Right Foot: Pain with palpation of nails due to elongation and dystrophic growth.  Assessment:   1. Pain due to onychomycosis of toenails of both feet   2. Diabetes mellitus type II, non insulin dependent ([place])       Plan:  Patient was evaluated and treated and all questions answered.  #Onychomycosis with pain  -Nails palliatively debrided as below. -Educated on self-care  Procedure: Nail Debridement Rationale: Pain Type of Debridement: manual, sharp debridement. Instrumentation: Nail nipper, rotary burr. Number of Nails: 10  No follow-ups on file.         Everitt Amber, DPM [facility]

## 2022-12-19 ENCOUNTER — Other Ambulatory Visit: Payer: Self-pay | Admitting: *Deleted

## 2022-12-19 MED ORDER — FLUTICASONE-SALMETEROL 250-50 MCG/ACT IN AEPB: INHALATION_SPRAY | RESPIRATORY_TRACT | 5 refills | Status: DC

## 2022-12-19 NOTE — Telephone Encounter (Signed)
Informed and she has a follow up with her pulmonologist coming up soon. She will discuss this with them.

## 2022-12-19 NOTE — Telephone Encounter (Signed)
[name] states that her Trelegy is too expensive. Per Dr. Neldon Mc, we will give her generic Advair Diskus 250 one puff twice daily. That is the only inhaler that is a lower tier on her formulary. I have left a message for [name] to call back so we can let her know about the switch.

## 2022-12-21 DIAGNOSIS — R69 Illness, unspecified: Secondary | ICD-10-CM | POA: Diagnosis not present

## 2022-12-21 DIAGNOSIS — Z8249 Family history of ischemic heart disease and other diseases of the circulatory system: Secondary | ICD-10-CM | POA: Diagnosis not present

## 2022-12-21 DIAGNOSIS — E785 Hyperlipidemia, unspecified: Secondary | ICD-10-CM | POA: Diagnosis not present

## 2022-12-21 DIAGNOSIS — Z9181 History of falling: Secondary | ICD-10-CM | POA: Diagnosis not present

## 2022-12-21 DIAGNOSIS — R32 Unspecified urinary incontinence: Secondary | ICD-10-CM | POA: Diagnosis not present

## 2022-12-21 DIAGNOSIS — J45909 Unspecified asthma, uncomplicated: Secondary | ICD-10-CM | POA: Diagnosis not present

## 2022-12-21 DIAGNOSIS — I10 Essential (primary) hypertension: Secondary | ICD-10-CM | POA: Diagnosis not present

## 2022-12-21 DIAGNOSIS — Z7984 Long term (current) use of oral hypoglycemic drugs: Secondary | ICD-10-CM | POA: Diagnosis not present

## 2022-12-21 DIAGNOSIS — K219 Gastro-esophageal reflux disease without esophagitis: Secondary | ICD-10-CM | POA: Diagnosis not present

## 2022-12-21 DIAGNOSIS — E1151 Type 2 diabetes mellitus with diabetic peripheral angiopathy without gangrene: Secondary | ICD-10-CM | POA: Diagnosis not present

## 2022-12-21 DIAGNOSIS — G4733 Obstructive sleep apnea (adult) (pediatric): Secondary | ICD-10-CM | POA: Diagnosis not present

## 2022-12-22 ENCOUNTER — Ambulatory Visit (INDEPENDENT_AMBULATORY_CARE_PROVIDER_SITE_OTHER): Payer: Medicare HMO | Admitting: *Deleted

## 2022-12-22 DIAGNOSIS — J4550 Severe persistent asthma, uncomplicated: Secondary | ICD-10-CM

## 2022-12-22 NOTE — Telephone Encounter (Signed)
Symia has been informed.

## 2022-12-28 DIAGNOSIS — J4540 Moderate persistent asthma, uncomplicated: Secondary | ICD-10-CM | POA: Diagnosis not present

## 2022-12-28 DIAGNOSIS — E119 Type 2 diabetes mellitus without complications: Secondary | ICD-10-CM | POA: Diagnosis not present

## 2022-12-28 DIAGNOSIS — I10 Essential (primary) hypertension: Secondary | ICD-10-CM | POA: Diagnosis not present

## 2023-01-02 ENCOUNTER — Ambulatory Visit (INDEPENDENT_AMBULATORY_CARE_PROVIDER_SITE_OTHER): Payer: Medicare HMO

## 2023-01-02 ENCOUNTER — Ambulatory Visit (INDEPENDENT_AMBULATORY_CARE_PROVIDER_SITE_OTHER): Payer: Medicare HMO | Admitting: Pulmonary Disease

## 2023-01-02 ENCOUNTER — Encounter (HOSPITAL_BASED_OUTPATIENT_CLINIC_OR_DEPARTMENT_OTHER): Payer: Self-pay | Admitting: Pulmonary Disease

## 2023-01-02 VITALS — BP 136/78 | HR 55 | Temp 98.3°F | Ht 60.0 in | Wt 144.6 lb

## 2023-01-02 DIAGNOSIS — G4733 Obstructive sleep apnea (adult) (pediatric): Secondary | ICD-10-CM | POA: Diagnosis not present

## 2023-01-02 DIAGNOSIS — Z8701 Personal history of pneumonia (recurrent): Secondary | ICD-10-CM | POA: Diagnosis not present

## 2023-01-02 DIAGNOSIS — J4489 Other specified chronic obstructive pulmonary disease: Secondary | ICD-10-CM

## 2023-01-02 DIAGNOSIS — Z0389 Encounter for observation for other suspected diseases and conditions ruled out: Secondary | ICD-10-CM | POA: Diagnosis not present

## 2023-01-02 MED ORDER — SPIRIVA RESPIMAT 2.5 MCG/ACT IN AERS: 2.0000 | INHALATION_SPRAY | Freq: Every day | RESPIRATORY_TRACT | 5 refills | Status: DC

## 2023-01-02 NOTE — Patient Instructions (Signed)
Spiriva two puffs daily  Chest xray today  Follow up in 4 months

## 2023-01-02 NOTE — Progress Notes (Signed)
[facility]  Chief Complaint  Patient presents with   Follow-up    Pt states she is not using her Trelegy right now. Pt states it costs too  much so she was changed to Advair by Dr Carmelina Peal. Pt states she was in the hospital for Christmas for pneumonia and in August for a tare in her esophagus.     Constitutional:  BP 136/78 (BP Location: Right Arm, Patient Position: Sitting, Cuff Size: Normal)   Pulse (!) 55   Temp 98.3 F (36.8 C) (Oral)   Ht 5' (1.524 m)   Wt 144 lb 9.6 oz (65.6 kg)   SpO2 98%   BMI 28.24 kg/m   Past Medical History:  Allergies, Anxiety, Depression, Hiatal hernia, DM, GERD, Colon polyp, Nephrolithiasis, HLD, IBS, Sarcoidosis, HTN  Past Surgical History:  She  has a past surgical history that includes Partial hysterectomy; Tonsillectomy and adenoidectomy; Nasal septum surgery; Tubal ligation; Hemorrhoid surgery; Bladder surgery; Cataract extraction (Bilateral); Trigger finger release (Left, 05/12/2016); Hardware Removal (Left, 01/12/2017); Reverse shoulder arthroplasty (Left, 02/22/2018); Reverse shoulder arthroplasty (Left, 02/22/2018); and Transcarotid artery revascularization (Left, 01/17/2020).  Brief Summary:  [name] is a 77 y.o. female with obstructive sleep apnea, COPD with asthma, and sarcoidosis.      Subjective:   She is here with her husband.    She had to switch from trelegy to advair because of insurance coverage.  Breathing has been worse.  She is cough morning and has more chest tightness.  She was switched to tezspire by her allergist about 6 months ago.  This has helped some.    She had a CT chest in December.  This showed changes of pneumonia.  She had stable changes of bronchiectasis.  She denies fever, hemoptysis, or sweats.  Physical Exam:   Appearance - well kempt   ENMT - no sinus tenderness, no oral exudate, no LAN, Mallampati 3 airway, no stridor  Respiratory - equal breath  sounds bilaterally, no wheezing or rales  CV - s1s2 regular rate and rhythm, no murmurs  Ext - no clubbing, no edema  Skin - no rashes  Psych - normal mood and affect     Pulmonary testing:  Lymph node bx 04/27/01 >> non-caseating granuloma Spirometry 08/06/18 >> FEV1 0.79 (42%), FEV1% 65 PFT 11/02/18 >> FEV1 1.03 (53%), FEV1% 65, TLC 3.45 (76%), DLCO 60%, + BD Spirometry 10/27/21 >> FEV1 1.03 (57%), FEV1% 57  Chest Imaging:  HRCT chest 09/28/18 >> atherosclerosis, 6 mm nodule RML, 10 mm nodule LUL both stable since 2016; calcified granulomas, mild patchy air trapping, moderate varicoid BTX, mild subpleural reticulation CT chest 10/16/20 >> no significant change CT chest 11/18/22 >> coronary calcifications, borderline LAN, small Lt effusion, unchanged BTX, new GGO and nodular consolidation b/l  Sleep Tests:  HST 09/27/18 >> AHI 32.4, SpO2 low 76%. Auto CPAP 11/15/22 to 12/14/22 >> used on 5 of 30 nights with average 4 hrs 23 min.  Average AHI 2.8 with median CPAP 9 and 95 th percentile CPAP 12 cm H2O  Cardiac Tests:  Echo 07/02/19 >> EF 60 to 65%, mild/mod MR, mild AS  Social History:  She  reports that she has never smoked. She has never used smokeless tobacco. She reports that she does not drink alcohol and does not use drugs.  Family History:  Her family history includes Diabetes in her maternal grandmother and mother; Esophageal cancer in her father.     Assessment/Plan:  Obstructive sleep apnea. - she is compliant with CPAP and reports benefit - she uses [employer] for her DME - current CPAP ordered November 2019 - continue auto CPAP 5 to 15 cm H2O  Community acquired pneumonia. - in December 2023 - her symptoms description and radiographic features are more suggestive of infectious process rather than recurrent of sarcoidosis  COPD with asthma. - followed by Dr. Neldon Mc with [facility] and remains on tezspire injection - symptoms worse since she had to stop  trelegy due to insurance coverage - will have her try spiriva respimat in addition to advair - continue singulair   History of pulmonary sarcoidosis with bronchiectasis. - repeat chest xray today; if abnormalities persist compared to findings in December 2023, then she would need a repeat CT chest  Rheumatic valve disease, hypertension. - followed by Dr. Geraldo Pitter with [facility]  Time Spent Involved in Patient Care on Day of Examination:  39 minutes  Follow up:   Patient Instructions  Spiriva two puffs daily  Chest xray today  Follow up in 4 months  Medication List:   Allergies as of 01/02/2023       Reactions   Codeine Nausea And Vomiting   Sulfa Antibiotics Other (See Comments)   Allergic per pt's mother         Medication List        Accurate as of January 02, 2023  5:32 PM. If you have any questions, ask your nurse or doctor.          STOP taking these medications    Trelegy Ellipta 200-62.5-25 MCG/ACT Aepb Generic drug: Fluticasone-Umeclidin-Vilant Stopped by: Chesley Mires, MD       TAKE these medications    acetaminophen 325 MG tablet Commonly known as: TYLENOL Take 2 tablets (650 mg total) by mouth every 6 (six) hours as needed for mild pain (or Fever >/= 101).   albuterol 108 (90 Base) MCG/ACT inhaler Commonly known as: VENTOLIN HFA Inhale 2 puffs into the lungs every 6 (six) hours as needed for wheezing or shortness of breath.   alendronate 70 MG tablet Commonly known as: FOSAMAX Take 70 mg by mouth once a week.   aspirin EC 81 MG tablet Take 1 tablet by mouth daily.   atorvastatin 40 MG tablet Commonly known as: LIPITOR Take 40 mg by mouth at bedtime.   citalopram 20 MG tablet Commonly known as: CELEXA Take 20 mg by mouth daily.   dicyclomine 20 MG tablet Commonly known as: BENTYL Take 20 mg by mouth as needed for spasms.   EPINEPHrine 0.3 mg/0.3 mL Soaj injection Commonly known as: EPI-PEN Use as directed for  life-threatening allergic reaction.   famotidine 40 MG tablet Commonly known as: PEPCID TAKE ONE TABLET BY MOUTH EVERYDAY AT BEDTIME   fluticasone 50 MCG/ACT nasal spray Commonly known as: FLONASE Place 1-2 sprays into both nostrils as needed for allergies or rhinitis.   fluticasone-salmeterol 250-50 MCG/ACT Aepb Commonly known as: ADVAIR Inhale one puff twice daily to prevent cough or wheeze. Rinse mouth after use.   ipratropium 0.06 % nasal spray Commonly known as: ATROVENT Can use one to two sprays in each nostril every six hours if needed.   ipratropium-albuterol 0.5-2.5 (3) MG/3ML Soln Commonly known as: DUONEB Take 3 mLs by nebulization every 6 (six) hours as needed for wheezing or shortness of breath (shortness of breath/wheezing).   IRON PO Take 1 tablet by mouth daily.   levocetirizine 5 MG tablet Commonly known  asHarlow Ohms Take 5 mg by mouth at bedtime.   metoprolol succinate 25 MG 24 hr tablet Commonly known as: TOPROL-XL Take 25 mg by mouth at bedtime.   montelukast 10 MG tablet Commonly known as: SINGULAIR Take 10 mg by mouth daily.   omeprazole 40 MG capsule Commonly known as: PRILOSEC Take 40 mg by mouth 2 (two) times daily.   Ozempic (0.25 or 0.5 MG/DOSE) 2 MG/1.5ML Sopn Generic drug: Semaglutide(0.25 or 0.'5MG'$ /DOS) Inject 0.25 mg into the skin every 7 (seven) days.   pioglitazone 15 MG tablet Commonly known as: ACTOS Take 15 mg by mouth daily.   PROBIOTIC DAILY PO Take 1 capsule by mouth daily.   Spiriva Respimat 2.5 MCG/ACT Aers Generic drug: Tiotropium Bromide Monohydrate Inhale 2 puffs into the lungs daily. Started by: Chesley Mires, MD   Telmisartan-amLODIPine 80-5 MG Tabs Take 1 tablet by mouth daily.        Signature:  Chesley Mires, MD [place] Pager - [phone] 01/02/2023, 5:32 PM

## 2023-01-19 ENCOUNTER — Ambulatory Visit (INDEPENDENT_AMBULATORY_CARE_PROVIDER_SITE_OTHER): Payer: Medicare HMO | Admitting: *Deleted

## 2023-01-19 DIAGNOSIS — J4550 Severe persistent asthma, uncomplicated: Secondary | ICD-10-CM | POA: Diagnosis not present

## 2023-01-24 DIAGNOSIS — Z6826 Body mass index (BMI) 26.0-26.9, adult: Secondary | ICD-10-CM | POA: Diagnosis not present

## 2023-01-24 DIAGNOSIS — D692 Other nonthrombocytopenic purpura: Secondary | ICD-10-CM | POA: Diagnosis not present

## 2023-01-24 DIAGNOSIS — K5909 Other constipation: Secondary | ICD-10-CM | POA: Diagnosis not present

## 2023-01-24 DIAGNOSIS — N3001 Acute cystitis with hematuria: Secondary | ICD-10-CM | POA: Diagnosis not present

## 2023-01-25 ENCOUNTER — Other Ambulatory Visit: Payer: Self-pay | Admitting: *Deleted

## 2023-01-25 DIAGNOSIS — I6523 Occlusion and stenosis of bilateral carotid arteries: Secondary | ICD-10-CM

## 2023-01-30 ENCOUNTER — Ambulatory Visit: Payer: Medicare HMO

## 2023-01-30 ENCOUNTER — Encounter (HOSPITAL_COMMUNITY): Payer: Medicare HMO

## 2023-02-01 DIAGNOSIS — G4733 Obstructive sleep apnea (adult) (pediatric): Secondary | ICD-10-CM | POA: Diagnosis not present

## 2023-02-06 ENCOUNTER — Ambulatory Visit (HOSPITAL_COMMUNITY)
Admission: RE | Admit: 2023-02-06 | Discharge: 2023-02-06 | Disposition: A | Discharge status: Home / Self Care | Payer: Medicare HMO | Source: Ambulatory Visit | Attending: Surgery | Admitting: Surgery

## 2023-02-06 ENCOUNTER — Encounter: Payer: Self-pay | Admitting: Physician Assistant

## 2023-02-06 ENCOUNTER — Ambulatory Visit: Payer: Medicare HMO | Admitting: Physician Assistant

## 2023-02-06 DIAGNOSIS — I6523 Occlusion and stenosis of bilateral carotid arteries: Secondary | ICD-10-CM | POA: Diagnosis not present

## 2023-02-06 NOTE — Progress Notes (Signed)
Virtual Visit via Telephone Note   I connected with [name] on 02/06/2023 using the [employer] by telephone and verified that I was speaking with the correct person using two identifiers. Patient was located in the car and accompanied by her husband. I am located at [facility].   The limitations of evaluation and management by telemedicine and the availability of in person appointments have been previously discussed with the patient and are documented in the patients chart. The patient expressed understanding and consented to proceed.  PCP: Serita Grammes, MD  History of Present Illness: [name] is a 77 y.o. female with a phone visit today regarding carotid artery surveillance.  She underwent left sided TCAR by Dr. Trula Slade in February 2021 due to a symptomatic lesion.  She denies any further strokelike symptoms since last office visit.  She is taking a aspirin and statin daily.  She is a former smoker.  Past Medical History:  Diagnosis Date   ABDOMINAL PAIN RIGHT LOWER QUADRANT 03/03/2010   Qualifier: Diagnosis of  By: Olevia Perches MD, Lowella Bandy    Acquired trigger finger 04/20/2016   Age-related osteoporosis without current pathological fracture 01/23/2017   Allergy    Anxiety    Arthritis    Asthma    Bilateral carotid artery stenosis 01/16/2020   Bilateral primary osteoarthritis of knee 10/16/2018   Cataract    bilateral and removed   Change in bowel function 11/07/2022   Chest pain 11/01/2013   Closed fracture of distal end of radius 06/05/2016   DDD (degenerative disc disease), lumbar 01/23/2017   Depression    Diabetes mellitus type II, non insulin dependent ([place]) 07/05/2019   Diaphragmatic hernia without mention of obstruction or gangrene    DM 03/03/2010   Qualifier: Diagnosis of  By: Tamala Julian CMA, Claiborne Billings     DM (diabetes mellitus) ([place])    DOE (dyspnea on exertion) 05/06/2019   Dyslipidemia 01/23/2017   Dyspnea    Esophageal reflux    Esophagitis, unspecified     Essential hypertension 05/02/2008   Qualifier: Diagnosis of  By: Nelson-Smith CMA (AAMA), Dottie     Full incontinence of feces 03/03/2010   Qualifier: Diagnosis of  By: Olevia Perches MD, Lowella Bandy    H/O adenomatous polyp of colon 2011   H/O allergic rhinitis 07/29/2015   Hearing loss, mixed, bilateral 12/17/2018   Heart murmur    no cardiologist, family Dr. Maryfrances Bunnell   History of asthma 01/23/2017   History of depression 01/23/2017   History of diabetes mellitus 01/23/2017   History of gastroesophageal reflux (GERD) 01/17/2017   History of kidney stones    History of transient ischemic attack (TIA) 01/16/2020   Hyperlipidemia    Incontinence of feces    Irritable bowel syndrome    Kidney stones    LPRD (laryngopharyngeal reflux disease) 07/29/2015   Mallory-Weiss tear 07/03/2022   Mild aortic stenosis 07/05/2019   Mitral regurgitation 07/05/2019   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 Here for medical clearance for the right ear. Noted to have recent worsening of her right ear on audiogram at an outside facility.  Denies any pain.  In general, she is functioning well with her current hearing aid though she is looking at getting a new one.  Left ear with profound loss from prior surgery.  Audio   Murmur  11/01/2013   Normocytic anemia 01/16/2020   Obstructive lung disease ([place]) 07/29/2015   OSA (obstructive sleep apnea) 09/28/2018   Pain in left wrist 06/06/2016   Pedal edema 05/06/2019   Primary osteoarthritis of both feet 01/17/2017   Primary osteoarthritis of both hands 12/13/2016   Primary osteoarthritis of both knees 01/17/2017   Primary osteoarthritis, left shoulder 12/11/2017   Retained orthopedic hardware 11/14/2016   Rheumatoid factor positive 12/13/2016   Sarcoidosis    Severe persistent asthma 11/16/2007   Annotation:  chronic Qualifier: Diagnosis of  By: Melvyn Novas MD, Christena Deem    Shoulder arthritis 02/22/2018   Sleep apnea    no cpap now   Status post dilation of esophageal narrowing    Stiffness of left wrist joint 06/06/2016   Unspecified hearing loss    Vitamin D deficiency 01/17/2017    Past Surgical History:  Procedure Laterality Date   BLADDER SURGERY     CATARACT EXTRACTION Bilateral    HARDWARE REMOVAL Left 01/12/2017   Procedure: HARDWARE REMOVAL left wrist;  Surgeon: Leanora Cover, MD;  Location: [facility];  Service: Orthopedics;  Laterality: Left;   HEMORRHOID SURGERY     NASAL SEPTUM SURGERY     PARTIAL HYSTERECTOMY     REVERSE SHOULDER ARTHROPLASTY Left 02/22/2018   REVERSE SHOULDER ARTHROPLASTY Left 02/22/2018   Procedure: LEFT REVERSE SHOULDER ARTHROPLASTY;  Surgeon: Meredith Pel, MD;  Location: [facility];  Service: Orthopedics;  Laterality: Left;   TONSILLECTOMY AND ADENOIDECTOMY     TRANSCAROTID ARTERY REVASCULARIZATION  Left 01/17/2020   Procedure: TRANSCAROTID ARTERY REVASCULARIZATION;  Surgeon: Serafina Mitchell, MD;  Location: [facility];  Service: Vascular;  Laterality: Left;   TRIGGER FINGER RELEASE Left 05/12/2016   Procedure: RELEASE TRIGGER FINGER/A-1 PULLEY INFECTION LEFT RING TENDON SHEATH INJECT RIGHT INDEX FINGER;  Surgeon: Leanora Cover, MD;  Location: [facility];  Service: Orthopedics;  Laterality: Left;   TUBAL LIGATION      No outpatient medications have been marked as taking for the 02/06/23 encounter (Appointment) with Dagoberto Ligas, PA-C.   Current Facility-Administered Medications for the 02/06/23 encounter (Appointment) with Dagoberto Ligas, PA-C  Medication   tezepelumab-ekko (TEZSPIRE) 210 MG/1.91ML syringe 210 mg    12 system ROS was negative unless otherwise noted in HPI   Observations/Objective: Widely patent left ICA stent Right ICA 1 to 39% stenosis  Assessment and Plan: No complaints or areas of concern during our  telephone visit today.  Her carotid duplex is unchanged.  Left ICA stent is widely patent.  Right ICA stenosis is estimated to be less than 39%.  She will continue her aspirin and statin daily.  We will repeat carotid duplex in 1 year.  She will continue to follow with her PCP for chronic medical conditions.  She knows to call/return office sooner with any questions or concerns.  Follow Up Instructions:   Follow up in 1 year(s)   I discussed the assessment and treatment plan with the patient. The patient was provided an opportunity to ask questions and all were answered. The patient agreed with the plan and demonstrated an understanding of the instructions.   The patient was advised to call back or seek an in-person evaluation if the symptoms worsen or if the condition fails to improve as anticipated.  I spent 5 minutes with the patient via telephone encounter.   Signed, Dagoberto Ligas [facility] Office: [phone]  02/06/2023, 1:35 PM

## 2023-02-08 ENCOUNTER — Encounter: Payer: Self-pay | Admitting: Orthopedic Surgery

## 2023-02-08 ENCOUNTER — Ambulatory Visit (HOSPITAL_COMMUNITY): Payer: Medicare HMO

## 2023-02-08 ENCOUNTER — Ambulatory Visit: Payer: Medicare HMO | Admitting: Orthopedic Surgery

## 2023-02-08 ENCOUNTER — Ambulatory Visit (INDEPENDENT_AMBULATORY_CARE_PROVIDER_SITE_OTHER): Payer: Medicare HMO

## 2023-02-08 DIAGNOSIS — M1711 Unilateral primary osteoarthritis, right knee: Secondary | ICD-10-CM

## 2023-02-08 DIAGNOSIS — M7989 Other specified soft tissue disorders: Secondary | ICD-10-CM

## 2023-02-08 DIAGNOSIS — M545 Low back pain, unspecified: Secondary | ICD-10-CM

## 2023-02-08 DIAGNOSIS — M1712 Unilateral primary osteoarthritis, left knee: Secondary | ICD-10-CM

## 2023-02-08 MED ORDER — METHOCARBAMOL 500 MG PO TABS: ORAL_TABLET | ORAL | 0 refills | Status: DC

## 2023-02-08 NOTE — Progress Notes (Signed)
Office Visit Note   Patient: [name]           Date of Birth: 1946-10-28           MRN: [id number] Visit Date: 02/08/2023 Requested by: Serita Grammes, MD [address] [place],  [place] [id number] PCP: Serita Grammes, MD  Subjective: Chief Complaint  Patient presents with   Left Knee - Pain   Left Leg - Pain   Lower Back - Pain    HPI: [name] is a 77 y.o. female who presents to the office reporting bilateral knee pain as well as some right back and buttock pain.  She reports some left knee swelling which goes down the leg.  Also reports some foot and ankle swelling.  Has known history of bilateral knee arthritis.  Reports stiffness in the knees after sitting and some difficulty at times with ambulation.  Patient also reports some right back and buttock pain with no known injury.  He is going to [place] this weekend.  Hurts her more to bowl due to her knee and back issues.  She did have an MRI scan in 2021 which showed age-appropriate degenerative changes but nothing operative.  She last had bilateral knee injections 09/30/2022.  Is difficult for her to go from sitting to standing position.  Does have diabetes.  Last injections did not increase her blood glucose..                ROS: All systems reviewed are negative as they relate to the chief complaint within the history of present illness.  Patient denies fevers or chills.  Assessment & Plan: Visit Diagnoses:  1. Low back pain, unspecified back pain laterality, unspecified chronicity, unspecified whether sciatica present   2. Left leg swelling     Plan: Impression is slightly asymmetric left lower extremity swelling versus the right.  She also has known bilateral knee arthritis.  Lumbar spine radiographs unremarkable and her exam is most consistent with exacerbation of mild underlying lumbar spine spondylosis.  Will try Robaxin for that.  Also plan to inject both knees with slightly diminished amount of  cortisone.  Ultrasound left lower extremity rule out DVT which is negative at the time of this dictation.  She will follow-up with [place] as needed.  Follow-Up Instructions: No follow-ups on file.   Orders:  Orders Placed This Encounter  Procedures   XR Lumbar Spine 2-3 Views   VAS [place] LOWER EXTREMITY VENOUS (DVT)   Meds ordered this encounter  Medications   methocarbamol (ROBAXIN) 500 MG tablet    Sig: 1 po q 8 hrs prn spasms    Dispense:  30 tablet    Refill:  0      Procedures: Large Joint Inj: bilateral knee on 02/08/2023 10:13 PM Indications: diagnostic evaluation, joint swelling and pain Details: 18 G 1.5 in needle, superolateral approach  Arthrogram: No  Outcome: tolerated well, no immediate complications Procedure, treatment alternatives, risks and benefits explained, specific risks discussed. Consent was given by the patient. Immediately prior to procedure a time out was called to verify the correct patient, procedure, equipment, support staff and site/side marked as required. Patient was prepped and draped in the usual sterile fashion.       Clinical Data: No additional findings.  Objective: Vital Signs: There were no vitals taken for this visit.  Physical Exam:  Constitutional: Patient appears well-developed HEENT:  Head: Normocephalic Eyes:EOM are normal Neck: Normal range of motion Cardiovascular: Normal  rate Pulmonary/chest: Effort normal Neurologic: Patient is alert Skin: Skin is warm Psychiatric: Patient has normal mood and affect  Ortho Exam: Ortho exam demonstrates slight flexion contracture on the left compared to the right.  No knee effusion.  Collateral crucial lengths are stable.  Extensor mechanism intact.  Pedal pulses palpable.  She has about 1 and half centimeters of calf swelling on the left compared to the right.  No nerve root tension signs.  No definite paresthesias L1 S1 bilaterally and only mild pain with forward lateral bending but no  trochanteric tenderness.    Specialty Comments:  No specialty comments available.  Imaging: XR Lumbar Spine 2-3 Views  Result Date: 02/08/2023 AP lateral lumbar spine radiographs reviewed.  Normal lordosis is present.  No acute fracture.  No spondylolisthesis.  Mild for age degenerative changes noted between the vertebral bodies and in the facet joints especially in the lower lumbar spine.    PMFS History: Patient Active Problem List   Diagnosis Date Noted   Mixed conductive and sensorineural hearing loss of right ear with restricted hearing of left ear 11/08/2022   Change in bowel function 11/07/2022   Mallory-Weiss tear 07/03/2022   Allergy    Anxiety    Arthritis    Cataract    Depression    DM (diabetes mellitus) ([place])    Dyspnea    Heart murmur    History of kidney stones    Hyperlipidemia    Kidney stones    Sleep apnea    Status post dilation of esophageal narrowing    Asthma 01/16/2020   History of transient ischemic attack (TIA) 01/16/2020   Bilateral carotid artery stenosis 01/16/2020   Normocytic anemia 01/16/2020   Diabetes mellitus type II, non insulin dependent ([place]) 07/05/2019   Mild aortic stenosis 07/05/2019   Mitral regurgitation 07/05/2019   Pedal edema 05/06/2019   DOE (dyspnea on exertion) 05/06/2019   Hearing loss, mixed, bilateral 12/17/2018   Bilateral primary osteoarthritis of knee 10/16/2018   OSA (obstructive sleep apnea) 09/28/2018   Shoulder arthritis 02/22/2018   Primary osteoarthritis, left shoulder 12/11/2017   DDD (degenerative disc disease), lumbar 01/23/2017   History of diabetes mellitus 01/23/2017   History of depression 01/23/2017   History of asthma 01/23/2017   Dyslipidemia 01/23/2017   Age-related osteoporosis without current pathological fracture 01/23/2017   History of gastroesophageal reflux (GERD) 01/17/2017   Primary osteoarthritis of both knees 01/17/2017   Primary osteoarthritis of both feet 01/17/2017   Vitamin D  deficiency 01/17/2017   Rheumatoid factor positive 12/13/2016   Retained orthopedic hardware 11/14/2016   Pain in left wrist 06/06/2016   Stiffness of left wrist joint 06/06/2016   Closed fracture of distal end of radius 06/05/2016   Acquired trigger finger 04/20/2016   Obstructive lung disease ([place]) 07/29/2015   H/O allergic rhinitis 07/29/2015   LPRD (laryngopharyngeal reflux disease) 07/29/2015   Chest pain 11/01/2013   Murmur 11/01/2013   DM 03/03/2010   ABDOMINAL PAIN RIGHT LOWER QUADRANT 03/03/2010   FULL INCONTINENCE OF FECES 03/03/2010   H/O adenomatous polyp of colon 2011   DECREASED HEARING 05/02/2008   Essential hypertension 05/02/2008   ESOPHAGITIS 05/02/2008   HIATAL HERNIA 05/02/2008   RECTAL INCONTINENCE 05/02/2008   SARCOIDOSIS 11/16/2007   Severe persistent asthma 11/16/2007   GERD 11/16/2007   IRRITABLE BOWEL SYNDROME 11/16/2007   Past Medical History:  Diagnosis Date   ABDOMINAL PAIN RIGHT LOWER QUADRANT 03/03/2010   Qualifier: Diagnosis of  By: Olevia Perches MD,  Lowella Bandy    Acquired trigger finger 04/20/2016   Age-related osteoporosis without current pathological fracture 01/23/2017   Allergy    Anxiety    Arthritis    Asthma    Bilateral carotid artery stenosis 01/16/2020   Bilateral primary osteoarthritis of knee 10/16/2018   Cataract    bilateral and removed   Change in bowel function 11/07/2022   Chest pain 11/01/2013   Closed fracture of distal end of radius 06/05/2016   DDD (degenerative disc disease), lumbar 01/23/2017   Depression    Diabetes mellitus type II, non insulin dependent ([place]) 07/05/2019   Diaphragmatic hernia without mention of obstruction or gangrene    DM 03/03/2010   Qualifier: Diagnosis of  By: Tamala Julian CMA, Claiborne Billings     DM (diabetes mellitus) ([place])    DOE (dyspnea on exertion) 05/06/2019   Dyslipidemia 01/23/2017   Dyspnea    Esophageal reflux    Esophagitis, unspecified    Essential hypertension 05/02/2008   Qualifier: Diagnosis  of  By: Harlon Ditty CMA (AAMA), Dottie     Full incontinence of feces 03/03/2010   Qualifier: Diagnosis of  By: Olevia Perches MD, Lowella Bandy    H/O adenomatous polyp of colon 2011   H/O allergic rhinitis 07/29/2015   Hearing loss, mixed, bilateral 12/17/2018   Heart murmur    no cardiologist, family Dr. Maryfrances Bunnell   History of asthma 01/23/2017   History of depression 01/23/2017   History of diabetes mellitus 01/23/2017   History of gastroesophageal reflux (GERD) 01/17/2017   History of kidney stones    History of transient ischemic attack (TIA) 01/16/2020   Hyperlipidemia    Incontinence of feces    Irritable bowel syndrome    Kidney stones    LPRD (laryngopharyngeal reflux disease) 07/29/2015   Mallory-Weiss tear 07/03/2022   Mild aortic stenosis 07/05/2019   Mitral regurgitation 07/05/2019   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 Here for medical clearance for the right ear. Noted to have recent worsening of her right ear on audiogram at an outside facility.  Denies any pain.  In general, she is functioning well with her current hearing aid though she is looking at getting a new one.  Left ear with profound loss from prior surgery.  Audio   Murmur 11/01/2013   Normocytic anemia 01/16/2020   Obstructive lung disease ([place]) 07/29/2015   OSA (obstructive sleep apnea) 09/28/2018   Pain in left wrist 06/06/2016   Pedal edema 05/06/2019   Primary osteoarthritis of both feet 01/17/2017   Primary osteoarthritis of both hands 12/13/2016   Primary osteoarthritis of both knees 01/17/2017   Primary osteoarthritis, left shoulder 12/11/2017   Retained orthopedic hardware 11/14/2016   Rheumatoid factor positive 12/13/2016   Sarcoidosis    Severe persistent asthma 11/16/2007   Annotation: chronic Qualifier: Diagnosis of  By: Melvyn Novas MD, Christena Deem     Shoulder arthritis 02/22/2018   Sleep apnea    no cpap now   Status post dilation of esophageal narrowing    Stiffness of left wrist joint 06/06/2016   Unspecified hearing loss    Vitamin D deficiency 01/17/2017    Family History  Problem Relation Age of Onset   Esophageal cancer Father    Diabetes Mother    Diabetes Maternal Grandmother    Colon cancer Neg Hx    Rectal cancer Neg  Hx    Stomach cancer Neg Hx     Past Surgical History:  Procedure Laterality Date   BLADDER SURGERY     CATARACT EXTRACTION Bilateral    HARDWARE REMOVAL Left 01/12/2017   Procedure: HARDWARE REMOVAL left wrist;  Surgeon: Leanora Cover, MD;  Location: [facility];  Service: Orthopedics;  Laterality: Left;   HEMORRHOID SURGERY     NASAL SEPTUM SURGERY     PARTIAL HYSTERECTOMY     REVERSE SHOULDER ARTHROPLASTY Left 02/22/2018   REVERSE SHOULDER ARTHROPLASTY Left 02/22/2018   Procedure: LEFT REVERSE SHOULDER ARTHROPLASTY;  Surgeon: Meredith Pel, MD;  Location: [facility];  Service: Orthopedics;  Laterality: Left;   TONSILLECTOMY AND ADENOIDECTOMY     TRANSCAROTID ARTERY REVASCULARIZATION  Left 01/17/2020   Procedure: TRANSCAROTID ARTERY REVASCULARIZATION;  Surgeon: Serafina Mitchell, MD;  Location: [facility];  Service: Vascular;  Laterality: Left;   TRIGGER FINGER RELEASE Left 05/12/2016   Procedure: RELEASE TRIGGER FINGER/A-1 PULLEY INFECTION LEFT RING TENDON SHEATH INJECT RIGHT INDEX FINGER;  Surgeon: Leanora Cover, MD;  Location: [facility];  Service: Orthopedics;  Laterality: Left;   TUBAL LIGATION     Social History   Occupational History   Occupation: retired  Tobacco Use   Smoking status: Never   Smokeless tobacco: Never  Vaping Use   Vaping Use: Never used  Substance and Sexual Activity   Alcohol use: No   Drug use: No   Sexual activity: Not on file

## 2023-02-09 ENCOUNTER — Ambulatory Visit (HOSPITAL_COMMUNITY)
Admission: RE | Admit: 2023-02-09 | Discharge: 2023-02-09 | Disposition: A | Discharge status: Home / Self Care | Payer: Medicare HMO | Source: Ambulatory Visit | Attending: Orthopedic Surgery | Admitting: Orthopedic Surgery

## 2023-02-09 ENCOUNTER — Telehealth: Payer: Self-pay

## 2023-02-09 DIAGNOSIS — M7989 Other specified soft tissue disorders: Secondary | ICD-10-CM | POA: Diagnosis not present

## 2023-02-09 NOTE — Telephone Encounter (Signed)
IC advised. Patient verbalized understanding.  ?

## 2023-02-09 NOTE — Telephone Encounter (Signed)
FYI-  Per Velva Harman with [facility], patient is Negative for DVT, left leg.  Please advise. Thank you.

## 2023-02-09 NOTE — Telephone Encounter (Signed)
Have fun in [place]

## 2023-02-16 ENCOUNTER — Ambulatory Visit: Payer: Medicare HMO

## 2023-02-20 ENCOUNTER — Ambulatory Visit (INDEPENDENT_AMBULATORY_CARE_PROVIDER_SITE_OTHER): Payer: Medicare HMO | Admitting: *Deleted

## 2023-02-20 DIAGNOSIS — J4550 Severe persistent asthma, uncomplicated: Secondary | ICD-10-CM | POA: Diagnosis not present

## 2023-02-27 DIAGNOSIS — Z0000 Encounter for general adult medical examination without abnormal findings: Secondary | ICD-10-CM | POA: Diagnosis not present

## 2023-02-27 DIAGNOSIS — D860 Sarcoidosis of lung: Secondary | ICD-10-CM | POA: Diagnosis not present

## 2023-02-27 DIAGNOSIS — Z6826 Body mass index (BMI) 26.0-26.9, adult: Secondary | ICD-10-CM | POA: Diagnosis not present

## 2023-02-27 DIAGNOSIS — Z79899 Other long term (current) drug therapy: Secondary | ICD-10-CM | POA: Diagnosis not present

## 2023-02-27 DIAGNOSIS — E1165 Type 2 diabetes mellitus with hyperglycemia: Secondary | ICD-10-CM | POA: Diagnosis not present

## 2023-02-27 DIAGNOSIS — Z13220 Encounter for screening for lipoid disorders: Secondary | ICD-10-CM | POA: Diagnosis not present

## 2023-02-27 DIAGNOSIS — N1831 Chronic kidney disease, stage 3a: Secondary | ICD-10-CM | POA: Diagnosis not present

## 2023-02-27 DIAGNOSIS — Z1331 Encounter for screening for depression: Secondary | ICD-10-CM | POA: Diagnosis not present

## 2023-03-03 ENCOUNTER — Other Ambulatory Visit: Payer: Self-pay | Admitting: Orthopedic Surgery

## 2023-03-03 ENCOUNTER — Other Ambulatory Visit: Payer: Self-pay | Admitting: Allergy and Immunology

## 2023-03-04 DIAGNOSIS — G4733 Obstructive sleep apnea (adult) (pediatric): Secondary | ICD-10-CM | POA: Diagnosis not present

## 2023-03-14 ENCOUNTER — Ambulatory Visit: Payer: Medicare HMO | Admitting: Podiatry

## 2023-03-14 DIAGNOSIS — M79674 Pain in right toe(s): Secondary | ICD-10-CM

## 2023-03-14 DIAGNOSIS — B351 Tinea unguium: Secondary | ICD-10-CM

## 2023-03-14 DIAGNOSIS — M79675 Pain in left toe(s): Secondary | ICD-10-CM | POA: Diagnosis not present

## 2023-03-14 DIAGNOSIS — E119 Type 2 diabetes mellitus without complications: Secondary | ICD-10-CM

## 2023-03-14 NOTE — Progress Notes (Signed)
  Subjective:  Patient ID: [name], female    DOB: 03/11/1946,  MRN: [id number]   77 y.o. female presents with the above complaint. History confirmed with patient. Patient presenting with pain related to dystrophic thickened elongated nails. Patient is unable to trim own nails related to nail dystrophy and/or mobility issues. Patient does have a history of T2DM without peripheral neuropathy.   Objective:  Physical Exam: warm, good capillary refill, DP and PT pulses 2/4 bilateral nail exam onychomycosis of the toenails, onycholysis, and dystrophic nails DP pulses palpable, PT pulses palpable, and protective sensation intact Left Foot:  Pain with palpation of nails due to elongation and dystrophic growth.  Right Foot: Pain with palpation of nails due to elongation and dystrophic growth.  Assessment:   1. Pain due to onychomycosis of toenails of both feet   2. Diabetes mellitus type II, non insulin dependent       Plan:  Patient was evaluated and treated and all questions answered.  #Onychomycosis with pain  -Nails palliatively debrided as below. -Educated on self-care  Procedure: Nail Debridement Rationale: Pain Type of Debridement: manual, sharp debridement. Instrumentation: Nail nipper, rotary burr. Number of Nails: 10  Return in about 3 months (around 06/13/2023) for [facility].         Corinna Gab, DPM [facility] / [facility]

## 2023-03-14 NOTE — Progress Notes (Deleted)
Pt was a no show for apt, charge generated 

## 2023-03-14 NOTE — Addendum Note (Signed)
Addended by: Carlena Hurl F on: 03/14/2023 02:18 PM   Modules accepted: Level of Service

## 2023-03-20 ENCOUNTER — Ambulatory Visit (INDEPENDENT_AMBULATORY_CARE_PROVIDER_SITE_OTHER): Payer: Medicare HMO | Admitting: *Deleted

## 2023-03-20 DIAGNOSIS — J4550 Severe persistent asthma, uncomplicated: Secondary | ICD-10-CM | POA: Diagnosis not present

## 2023-03-21 DIAGNOSIS — Z6827 Body mass index (BMI) 27.0-27.9, adult: Secondary | ICD-10-CM | POA: Diagnosis not present

## 2023-03-21 DIAGNOSIS — K5900 Constipation, unspecified: Secondary | ICD-10-CM | POA: Diagnosis not present

## 2023-03-21 DIAGNOSIS — R109 Unspecified abdominal pain: Secondary | ICD-10-CM | POA: Diagnosis not present

## 2023-03-27 DIAGNOSIS — Z6826 Body mass index (BMI) 26.0-26.9, adult: Secondary | ICD-10-CM | POA: Diagnosis not present

## 2023-03-27 DIAGNOSIS — J90 Pleural effusion, not elsewhere classified: Secondary | ICD-10-CM | POA: Diagnosis not present

## 2023-03-27 DIAGNOSIS — N200 Calculus of kidney: Secondary | ICD-10-CM | POA: Diagnosis not present

## 2023-03-27 DIAGNOSIS — R0609 Other forms of dyspnea: Secondary | ICD-10-CM | POA: Diagnosis not present

## 2023-03-28 DIAGNOSIS — J4540 Moderate persistent asthma, uncomplicated: Secondary | ICD-10-CM | POA: Diagnosis not present

## 2023-03-28 DIAGNOSIS — E119 Type 2 diabetes mellitus without complications: Secondary | ICD-10-CM | POA: Diagnosis not present

## 2023-03-28 DIAGNOSIS — I10 Essential (primary) hypertension: Secondary | ICD-10-CM | POA: Diagnosis not present

## 2023-03-29 ENCOUNTER — Ambulatory Visit: Payer: Medicare HMO | Admitting: Allergy and Immunology

## 2023-03-30 DIAGNOSIS — N3001 Acute cystitis with hematuria: Secondary | ICD-10-CM | POA: Diagnosis not present

## 2023-03-30 DIAGNOSIS — J90 Pleural effusion, not elsewhere classified: Secondary | ICD-10-CM | POA: Diagnosis not present

## 2023-03-30 DIAGNOSIS — R0609 Other forms of dyspnea: Secondary | ICD-10-CM | POA: Diagnosis not present

## 2023-03-30 DIAGNOSIS — N200 Calculus of kidney: Secondary | ICD-10-CM | POA: Diagnosis not present

## 2023-03-30 DIAGNOSIS — Z6826 Body mass index (BMI) 26.0-26.9, adult: Secondary | ICD-10-CM | POA: Diagnosis not present

## 2023-04-03 DIAGNOSIS — J90 Pleural effusion, not elsewhere classified: Secondary | ICD-10-CM | POA: Diagnosis not present

## 2023-04-03 DIAGNOSIS — Z6826 Body mass index (BMI) 26.0-26.9, adult: Secondary | ICD-10-CM | POA: Diagnosis not present

## 2023-04-03 DIAGNOSIS — G4733 Obstructive sleep apnea (adult) (pediatric): Secondary | ICD-10-CM | POA: Diagnosis not present

## 2023-04-17 ENCOUNTER — Ambulatory Visit (INDEPENDENT_AMBULATORY_CARE_PROVIDER_SITE_OTHER): Payer: Medicare HMO | Admitting: *Deleted

## 2023-04-17 DIAGNOSIS — J4550 Severe persistent asthma, uncomplicated: Secondary | ICD-10-CM | POA: Diagnosis not present

## 2023-04-21 DIAGNOSIS — S0990XA Unspecified injury of head, initial encounter: Secondary | ICD-10-CM | POA: Diagnosis not present

## 2023-04-21 DIAGNOSIS — Y9301 Activity, walking, marching and hiking: Secondary | ICD-10-CM | POA: Diagnosis not present

## 2023-04-21 DIAGNOSIS — S022XXA Fracture of nasal bones, initial encounter for closed fracture: Secondary | ICD-10-CM | POA: Diagnosis not present

## 2023-04-21 DIAGNOSIS — W19XXXA Unspecified fall, initial encounter: Secondary | ICD-10-CM | POA: Diagnosis not present

## 2023-04-21 DIAGNOSIS — J3489 Other specified disorders of nose and nasal sinuses: Secondary | ICD-10-CM | POA: Diagnosis not present

## 2023-04-21 DIAGNOSIS — S0083XA Contusion of other part of head, initial encounter: Secondary | ICD-10-CM | POA: Diagnosis not present

## 2023-04-26 DIAGNOSIS — S022XXA Fracture of nasal bones, initial encounter for closed fracture: Secondary | ICD-10-CM | POA: Insufficient documentation

## 2023-04-26 HISTORY — DX: Fracture of nasal bones, initial encounter for closed fracture: S02.2XXA

## 2023-05-01 DIAGNOSIS — E876 Hypokalemia: Secondary | ICD-10-CM | POA: Diagnosis not present

## 2023-05-01 DIAGNOSIS — Z6826 Body mass index (BMI) 26.0-26.9, adult: Secondary | ICD-10-CM | POA: Diagnosis not present

## 2023-05-01 DIAGNOSIS — M2550 Pain in unspecified joint: Secondary | ICD-10-CM | POA: Diagnosis not present

## 2023-05-01 DIAGNOSIS — J90 Pleural effusion, not elsewhere classified: Secondary | ICD-10-CM | POA: Diagnosis not present

## 2023-05-01 DIAGNOSIS — S022XXS Fracture of nasal bones, sequela: Secondary | ICD-10-CM | POA: Diagnosis not present

## 2023-05-03 ENCOUNTER — Other Ambulatory Visit: Payer: Self-pay | Admitting: Allergy and Immunology

## 2023-05-08 ENCOUNTER — Other Ambulatory Visit: Payer: Self-pay

## 2023-05-15 ENCOUNTER — Ambulatory Visit: Payer: Medicare HMO

## 2023-05-15 ENCOUNTER — Ambulatory Visit: Payer: Medicare HMO | Admitting: Allergy and Immunology

## 2023-05-15 ENCOUNTER — Encounter: Payer: Self-pay | Admitting: Allergy and Immunology

## 2023-05-15 VITALS — BP 132/46 | HR 58 | Resp 16

## 2023-05-15 DIAGNOSIS — J4550 Severe persistent asthma, uncomplicated: Secondary | ICD-10-CM | POA: Diagnosis not present

## 2023-05-15 DIAGNOSIS — Z862 Personal history of diseases of the blood and blood-forming organs and certain disorders involving the immune mechanism: Secondary | ICD-10-CM

## 2023-05-15 DIAGNOSIS — J3089 Other allergic rhinitis: Secondary | ICD-10-CM | POA: Diagnosis not present

## 2023-05-15 DIAGNOSIS — K219 Gastro-esophageal reflux disease without esophagitis: Secondary | ICD-10-CM | POA: Diagnosis not present

## 2023-05-15 DIAGNOSIS — G4733 Obstructive sleep apnea (adult) (pediatric): Secondary | ICD-10-CM

## 2023-05-15 NOTE — Progress Notes (Unsigned)
[place] [facility] [place] - [place] - [place] - [place]   Follow-up Note  Referring Provider: Buckner Malta, MD Primary Provider: Buckner Malta, MD Date of Office Visit: 05/15/2023  Subjective:   [name] (DOB: October 14, 1946) is a 77 y.o. 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  I last saw her in this clinic 14 November 2022.  Overall she thinks that she has been doing pretty well regarding her lungs.  She does not really have a lot of coughing or wheezing and shortness of breath although she does not really exert herself to any significant degree.  She rarely uses any short acting bronchodilator.  She continues on tezepelumab injections as well last other anti-inflammatory agents for her airway.  Her reflux is under very good control on her current plan.  She is having a problem using her CPAP machine.  When she puts on her CPAP machine "the air is too cold" and it burns her nose and gives her nasal congestion and headache and basically she is not using this machine.  She does have a bubble reservoir on the machine.  Allergies as of 05/15/2023       Reactions   Codeine Nausea And Vomiting   Sulfa Antibiotics Other (See Comments)   Allergic per pt's mother         Medication List    acetaminophen 325 MG tablet Commonly known as: TYLENOL Take 2 tablets (650 mg total) by mouth every 6 (six) hours as needed for mild pain (or Fever >/= 101).   albuterol 108 (90 Base) MCG/ACT inhaler Commonly known as: VENTOLIN HFA Inhale 2 puffs into the lungs every 6 (six) hours as needed for wheezing or shortness of breath.   alendronate 70 MG tablet Commonly known as: FOSAMAX Take 70 mg by mouth once a week.   aspirin EC 81 MG tablet Take 1 tablet by mouth daily.    atorvastatin 40 MG tablet Commonly known as: LIPITOR Take 40 mg by mouth at bedtime.   citalopram 20 MG tablet Commonly known as: CELEXA Take 20 mg by mouth daily.   diclofenac 25 MG EC tablet Commonly known as: VOLTAREN Take 25 mg by mouth daily.   dicyclomine 20 MG tablet Commonly known as: BENTYL Take 20 mg by mouth as needed for spasms.   EPINEPHrine 0.3 mg/0.3 mL Soaj injection Commonly known as: EPI-PEN Use as directed for life-threatening allergic reaction.   famotidine 40 MG tablet Commonly known as: PEPCID TAKE ONE TABLET BY MOUTH EVERYDAY AT BEDTIME   fluticasone 50 MCG/ACT nasal spray Commonly known as: FLONASE Place 1-2 sprays into both nostrils as needed for allergies or rhinitis.   fluticasone-salmeterol 250-50 MCG/ACT Aepb Commonly known as: ADVAIR Inhale one puff twice daily to prevent cough or wheeze. Rinse mouth after use.   ipratropium 0.06 % nasal spray Commonly known as: ATROVENT INSTILL 1-2 SPRAYS IN EACH NOSTRIL EVERY 6 HOURS IF needed   ipratropium-albuterol 0.5-2.5 (3) MG/3ML Soln Commonly known as: DUONEB Take 3 mLs by nebulization every 6 (six) hours as needed for wheezing or shortness of breath (shortness of breath/wheezing).   IRON PO Take 1 tablet by mouth daily.   levocetirizine 5 MG tablet Commonly known as: XYZAL Take 5 mg by mouth at bedtime.   methocarbamol 500 MG tablet Commonly known as:  ROBAXIN TAKE ONE TABLET BY MOUTH EVERY 8 HOURS AS NEEDED FOR SPASMS   metoprolol succinate 25 MG 24 hr tablet Commonly known as: TOPROL-XL Take 25 mg by mouth at bedtime.   montelukast 10 MG tablet Commonly known as: SINGULAIR Take 10 mg by mouth daily.   omeprazole 40 MG capsule Commonly known as: PRILOSEC Take 40 mg by mouth 2 (two) times daily.   Ozempic (0.25 or 0.5 MG/DOSE) 2 MG/1.5ML Sopn Generic drug: Semaglutide(0.25 or 0.5MG /DOS) Inject 0.25 mg into the skin every 7 (seven) days.   pioglitazone 15 MG tablet Commonly  known as: ACTOS Take 15 mg by mouth daily.   PROBIOTIC DAILY PO Take 1 capsule by mouth daily.   tamsulosin 0.4 MG Caps capsule Commonly known as: FLOMAX Take 0.4 mg by mouth daily.   Telmisartan-amLODIPine 80-5 MG Tabs Take 1 tablet by mouth daily.   torsemide 20 MG tablet Commonly known as: DEMADEX Take 10 mg by mouth daily.        Past Medical History:  Diagnosis Date   ABDOMINAL PAIN RIGHT LOWER QUADRANT 03/03/2010   Qualifier: Diagnosis of  By: Juanda Chance MD, Hedwig Morton    Acquired trigger finger 04/20/2016   Age-related osteoporosis without current pathological fracture 01/23/2017   Allergy    Anxiety    Arthritis    Asthma    Bilateral carotid artery stenosis 01/16/2020   Bilateral primary osteoarthritis of knee 10/16/2018   Cataract    bilateral and removed   Change in bowel function 11/07/2022   Chest pain 11/01/2013   Closed fracture of distal end of radius 06/05/2016   Closed fracture of nasal bones 04/26/2023   Last Assessment & Plan: Formatting of this note might be different from the original. Nasal trauma. See history of present illness.  Denies nasal obstruction or concern over the appearance of the nose.  CT is reviewed and shows minimally displaced right nasal bone fracture. EXAM shows nasal dorsum in the midline.  Still little edema of the nasal dorsum.  Intranasal exam shows midline nasal septum.   DDD (degenerative disc disease), lumbar 01/23/2017   DECREASED HEARING 05/02/2008   Qualifier: Diagnosis of   By: Nelson-Smith CMA (AAMA), Dottie      Replacing diagnoses that were inactivated after the 02/27/23 regulatory import   Depression    Diabetes mellitus type II, non insulin dependent (HCC) 07/05/2019   DM 03/03/2010   Qualifier: Diagnosis of  By: Katrinka Blazing CMA, Tresa Endo     DM (diabetes mellitus) (HCC)    DOE (dyspnea on exertion) 05/06/2019   Dyslipidemia 01/23/2017   Dyspnea    Esophageal reflux    ESOPHAGITIS 05/02/2008   Qualifier: Diagnosis of   By:  Candice Camp CMA (AAMA), Dottie      Replacing diagnoses that were inactivated after the 02/27/23 regulatory import   Essential hypertension 05/02/2008   Qualifier: Diagnosis of  By: Nelson-Smith CMA (AAMA), Dottie     Full incontinence of feces 03/03/2010   Qualifier: Diagnosis of  By: Juanda Chance MD, Hedwig Morton    H/O adenomatous polyp of colon 2011   H/O allergic rhinitis 07/29/2015   Hearing loss, mixed, bilateral 12/17/2018   Heart murmur    no cardiologist, family Dr. Zollie Scale   HIATAL HERNIA 05/02/2008   Qualifier: Diagnosis of   By: Candice Camp CMA (AAMA), Dottie      Replacing diagnoses that were inactivated after the 02/27/23 regulatory import   History of asthma 01/23/2017   History of depression 01/23/2017   History  of diabetes mellitus 01/23/2017   History of gastroesophageal reflux (GERD) 01/17/2017   History of kidney stones    History of transient ischemic attack (TIA) 01/16/2020   Hyperlipidemia    Irritable bowel syndrome    Kidney stones    LPRD (laryngopharyngeal reflux disease) 07/29/2015   Mallory-Weiss tear 07/03/2022   Mild aortic stenosis 07/05/2019   Mitral regurgitation 07/05/2019   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 Here for medical clearance for the right ear. Noted to have recent worsening of her right ear on audiogram at an outside facility.  Denies any pain.  In general, she is functioning well with her current hearing aid though she is looking at getting a new one.  Left ear with profound loss from prior surgery.  Audio   Murmur 11/01/2013   Normocytic anemia 01/16/2020   Obstructive lung disease (HCC) 07/29/2015   OSA (obstructive sleep apnea) 09/28/2018   Pain in left wrist 06/06/2016   Pedal edema 05/06/2019   Primary osteoarthritis of both feet 01/17/2017   Primary osteoarthritis of both knees  01/17/2017   Primary osteoarthritis, left shoulder 12/11/2017   RECTAL INCONTINENCE 05/02/2008   Qualifier: Diagnosis of   By: Candice Camp CMA (AAMA), Dottie      Replacing diagnoses that were inactivated after the 02/27/23 regulatory import   Retained orthopedic hardware 11/14/2016   Rheumatoid factor positive 12/13/2016   Sarcoidosis    Severe persistent asthma 11/16/2007   Annotation: chronic Qualifier: Diagnosis of  By: Sherene Sires MD, [name]    Shoulder arthritis 02/22/2018   Sleep apnea    no cpap now   Status post dilation of esophageal narrowing    Stiffness of left wrist joint 06/06/2016   Vitamin D deficiency 01/17/2017    Past Surgical History:  Procedure Laterality Date   BLADDER SURGERY     CATARACT EXTRACTION Bilateral    HARDWARE REMOVAL Left 01/12/2017   Procedure: HARDWARE REMOVAL left wrist;  Surgeon: Betha Loa, MD;  Location: [facility];  Service: Orthopedics;  Laterality: Left;   HEMORRHOID SURGERY     NASAL SEPTUM SURGERY     PARTIAL HYSTERECTOMY     REVERSE SHOULDER ARTHROPLASTY Left 02/22/2018   REVERSE SHOULDER ARTHROPLASTY Left 02/22/2018   Procedure: LEFT REVERSE SHOULDER ARTHROPLASTY;  Surgeon: Cammy Copa, MD;  Location: [facility];  Service: Orthopedics;  Laterality: Left;   TONSILLECTOMY AND ADENOIDECTOMY     TRANSCAROTID ARTERY REVASCULARIZATION  Left 01/17/2020   Procedure: TRANSCAROTID ARTERY REVASCULARIZATION;  Surgeon: Nada Libman, MD;  Location: [facility] OR;  Service: Vascular;  Laterality: Left;   TRIGGER FINGER RELEASE Left 05/12/2016   Procedure: RELEASE TRIGGER FINGER/A-1 PULLEY INFECTION LEFT RING TENDON SHEATH INJECT RIGHT INDEX FINGER;  Surgeon: Betha Loa, MD;  Location: [facility];  Service: Orthopedics;  Laterality: Left;   TUBAL LIGATION      Review of systems negative except as noted in HPI / PMHx or noted below:  Review of Systems  Constitutional: Negative.   HENT: Negative.    Eyes: Negative.    Respiratory: Negative.    Cardiovascular: Negative.   Gastrointestinal: Negative.   Genitourinary: Negative.   Musculoskeletal: Negative.   Skin: Negative.   Neurological: Negative.   Endo/Heme/Allergies: Negative.   Psychiatric/Behavioral: Negative.       Objective:   Vitals:   05/15/23 1024  BP: (!) 150/74  Pulse: (!) 58  Resp: 16  SpO2: 96%          Physical Exam Constitutional:      Appearance: She is not diaphoretic.  HENT:     Head: Normocephalic.     Right Ear: External ear normal.     Left Ear: External ear normal.     Ears:     Comments: Bilateral hearing aids    Nose: Nose normal. No mucosal edema or rhinorrhea.     Mouth/Throat:     Pharynx: Uvula midline. No oropharyngeal exudate.  Eyes:     Conjunctiva/sclera: Conjunctivae normal.  Neck:     Thyroid: No thyromegaly.     Trachea: Trachea normal. No tracheal tenderness or tracheal deviation.  Cardiovascular:     Rate and Rhythm: Normal rate and regular rhythm.     Heart sounds: S1 normal and S2 normal. Murmur (systolic) heard.  Pulmonary:     Effort: No respiratory distress.     Breath sounds: No stridor. No wheezing or rales.  Lymphadenopathy:     Head:     Right side of head: No tonsillar adenopathy.     Left side of head: No tonsillar adenopathy.     Cervical: No cervical adenopathy.  Skin:    Findings: No erythema or rash.     Nails: There is no clubbing.  Neurological:     Mental Status: She is alert.     Diagnostics: Spirometry was performed and demonstrated an FEV1 of 1.23 at 70 % of predicted.  Assessment and Plan:   1. Severe persistent asthma without complication   2. History of sarcoidosis   3. Other allergic rhinitis   4. LPRD (laryngopharyngeal reflux disease)   5. OSA (obstructive sleep apnea)    1. Continue tezepelumab injections    2. Continue Advair 250 - 1 inhalation 2 times per day  3. Continue Flonase - one spray each nostril 1 time per day.    4. Continue  omeprazole 40 twice a day + Famotidine 40 mg in evening  5. If needed:   A. nasal saline   B. Albuterol HFA or Duoneb   C. OTC Mucinex DM 1-2 tablet 1-2 times per day   D. ipratropium 0.06% nasal spray - 1-2 sprays each nostril every 6 hours   6. Speak with sleep doctor about a bubble reservoir heater for CPAP machine  7. Return to clinic in 6 months or earlier if problem  8. Plan for fall flu vaccine  Overall Zahniya appears to be doing quite well regarding the issues that she is seen for in this clinic and she will continue on anti-TSLP antibody and other anti-inflammatory agents for airway and therapy directed against reflux as noted above.  She is having a problem with her CPAP and I have asked her to contact her sleep pulmonologist to see if she can get a heater for her bubble reservoir as it is the cold air that gives her much discomfort and some of the side effects from using this device.  I will see her back in this clinic in 6 months or earlier if there is a problem.  Laurette Schimke, MD Allergy / Immunology [facility]

## 2023-05-15 NOTE — Patient Instructions (Signed)
  1. Continue tezepelumab injections    2. Continue Advair 250 - 1 inhalation 2 times per day  3. Continue Flonase - one spray each nostril 1 time per day.    4. Continue omeprazole 40 twice a day + Famotidine 40 mg in evening  5. If needed:   A. nasal saline   B. Albuterol HFA or Duoneb   C. OTC Mucinex DM 1-2 tablet 1-2 times per day   D. ipratropium 0.06% nasal spray - 1-2 sprays each nostril every 6 hours   6. Speak with sleep doctor about a bubble reservoir heater for CPAP machine  7. Return to clinic in 6 months or earlier if problem  8. Plan for fall flu vaccine

## 2023-05-16 ENCOUNTER — Encounter: Payer: Self-pay | Admitting: Allergy and Immunology

## 2023-05-22 ENCOUNTER — Telehealth: Payer: Self-pay

## 2023-05-22 ENCOUNTER — Encounter: Payer: Self-pay | Admitting: Cardiology

## 2023-05-22 ENCOUNTER — Ambulatory Visit: Payer: Medicare HMO | Attending: Cardiology | Admitting: Cardiology

## 2023-05-22 VITALS — BP 142/58 | HR 60 | Ht 60.0 in | Wt 144.0 lb

## 2023-05-22 DIAGNOSIS — E119 Type 2 diabetes mellitus without complications: Secondary | ICD-10-CM

## 2023-05-22 DIAGNOSIS — G4733 Obstructive sleep apnea (adult) (pediatric): Secondary | ICD-10-CM

## 2023-05-22 DIAGNOSIS — Z7985 Long-term (current) use of injectable non-insulin antidiabetic drugs: Secondary | ICD-10-CM | POA: Diagnosis not present

## 2023-05-22 DIAGNOSIS — I10 Essential (primary) hypertension: Secondary | ICD-10-CM

## 2023-05-22 DIAGNOSIS — E782 Mixed hyperlipidemia: Secondary | ICD-10-CM

## 2023-05-22 DIAGNOSIS — I350 Nonrheumatic aortic (valve) stenosis: Secondary | ICD-10-CM | POA: Diagnosis not present

## 2023-05-22 NOTE — Progress Notes (Signed)
Cardiology Office Note:    Date:  05/22/2023   ID:  LEYDY WORTHEY, DOB February 25, 1946, MRN [id number]  PCP:  Buckner Malta, MD  Cardiologist:  Garwin Brothers, MD   Referring MD: Buckner Malta, MD    ASSESSMENT:    1. Essential hypertension   2. Mild aortic stenosis   3. OSA (obstructive sleep apnea)   4. Diabetes mellitus type II, non insulin dependent (HCC)   5. Mixed hyperlipidemia    PLAN:    In order of problems listed above:  Primary prevention stressed with the patient.  Importance of compliance with diet medication stressed and patient verbalized standing. Mild aortic stenosis: Stable at this time.  Asymptomatic and we will continue to monitor. Essential hypertension: Blood pressure stable and diet was emphasized.  In view of her overall frail status and not very much keen on aggressive blood pressure lowering.  I discussed this with her and she understands. Mixed dyslipidemia: Lipids followed by primary care.  She mentions to me that she had blood work done recently and was told it was fine. Patient will be seen in follow-up appointment in 9 months or earlier if the patient has any concerns.    Medication Adjustments/Labs and Tests Ordered: Current medicines are reviewed at length with the patient today.  Concerns regarding medicines are outlined above.  No orders of the defined types were placed in this encounter.  No orders of the defined types were placed in this encounter.    No chief complaint on file.    History of Present Illness:    [name] is a 77 y.o. female.  Patient has past medical history of essential hypertension, mild aortic stenosis, obstructive sleep apnea, dyslipidemia and diabetes mellitus.  She denies any problems at this time and takes care of activities of daily living.  No chest pain orthopnea or PND.  She ambulates regular basis within her house.  Past Medical History:  Diagnosis Date   ABDOMINAL PAIN RIGHT LOWER  QUADRANT 03/03/2010   Qualifier: Diagnosis of  By: Juanda Chance MD, Hedwig Morton    Acquired trigger finger 04/20/2016   Age-related osteoporosis without current pathological fracture 01/23/2017   Allergy    Anxiety    Arthritis    Asthma    Bilateral carotid artery stenosis 01/16/2020   Bilateral primary osteoarthritis of knee 10/16/2018   Cataract    bilateral and removed   Change in bowel function 11/07/2022   Chest pain 11/01/2013   Closed fracture of distal end of radius 06/05/2016   Closed fracture of nasal bones 04/26/2023   Last Assessment & Plan: Formatting of this note might be different from the original. Nasal trauma. See history of present illness.  Denies nasal obstruction or concern over the appearance of the nose.  CT is reviewed and shows minimally displaced right nasal bone fracture. EXAM shows nasal dorsum in the midline.  Still little edema of the nasal dorsum.  Intranasal exam shows midline nasal septum.   DDD (degenerative disc disease), lumbar 01/23/2017   DECREASED HEARING 05/02/2008   Qualifier: Diagnosis of   By: Nelson-Smith CMA (AAMA), Dottie      Replacing diagnoses that were inactivated after the 02/27/23 regulatory import   Depression    Diabetes mellitus type II, non insulin dependent (HCC) 07/05/2019   DM 03/03/2010   Qualifier: Diagnosis of  By: Katrinka Blazing CMA, Tresa Endo     DM (diabetes mellitus) (HCC)    DOE (dyspnea on exertion) 05/06/2019   Dyslipidemia  01/23/2017   Dyspnea    Esophageal reflux    ESOPHAGITIS 05/02/2008   Qualifier: Diagnosis of   By: Candice Camp CMA (AAMA), Dottie      Replacing diagnoses that were inactivated after the 02/27/23 regulatory import   Essential hypertension 05/02/2008   Qualifier: Diagnosis of  By: Nelson-Smith CMA (AAMA), Dottie     Full incontinence of feces 03/03/2010   Qualifier: Diagnosis of  By: Juanda Chance MD, Hedwig Morton    H/O adenomatous polyp of colon 2011   H/O allergic rhinitis 07/29/2015   Hearing loss, mixed, bilateral 12/17/2018    Heart murmur    no cardiologist, family Dr. Zollie Scale   HIATAL HERNIA 05/02/2008   Qualifier: Diagnosis of   By: Candice Camp CMA (AAMA), Dottie      Replacing diagnoses that were inactivated after the 02/27/23 regulatory import   History of asthma 01/23/2017   History of depression 01/23/2017   History of diabetes mellitus 01/23/2017   History of gastroesophageal reflux (GERD) 01/17/2017   History of kidney stones    History of transient ischemic attack (TIA) 01/16/2020   Hyperlipidemia    Irritable bowel syndrome    Kidney stones    LPRD (laryngopharyngeal reflux disease) 07/29/2015   Mallory-Weiss tear 07/03/2022   Mild aortic stenosis 07/05/2019   Mitral regurgitation 07/05/2019   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 Here for medical clearance for the right ear. Noted to have recent worsening of her right ear on audiogram at an outside facility.  Denies any pain.  In general, she is functioning well with her current hearing aid though she is looking at getting a new one.  Left ear with profound loss from prior surgery.  Audio   Murmur 11/01/2013   Normocytic anemia 01/16/2020   Obstructive lung disease (HCC) 07/29/2015   OSA (obstructive sleep apnea) 09/28/2018   Pain in left wrist 06/06/2016   Pedal edema 05/06/2019   Primary osteoarthritis of both feet 01/17/2017   Primary osteoarthritis of both knees 01/17/2017   Primary osteoarthritis, left shoulder 12/11/2017   RECTAL INCONTINENCE 05/02/2008   Qualifier: Diagnosis of   By: Candice Camp CMA (AAMA), Dottie      Replacing diagnoses that were inactivated after the 02/27/23 regulatory import   Retained orthopedic hardware 11/14/2016   Rheumatoid factor positive 12/13/2016   Sarcoidosis    Severe persistent asthma 11/16/2007   Annotation: chronic Qualifier: Diagnosis of   By: Sherene Sires MD, [name]    Shoulder arthritis 02/22/2018   Sleep apnea    no cpap now   Status post dilation of esophageal narrowing    Stiffness of left wrist joint 06/06/2016   Vitamin D deficiency 01/17/2017    Past Surgical History:  Procedure Laterality Date   BLADDER SURGERY     CATARACT EXTRACTION Bilateral    HARDWARE REMOVAL Left 01/12/2017   Procedure: HARDWARE REMOVAL left wrist;  Surgeon: Betha Loa, MD;  Location: [facility];  Service: Orthopedics;  Laterality: Left;   HEMORRHOID SURGERY     NASAL SEPTUM SURGERY     PARTIAL HYSTERECTOMY     REVERSE SHOULDER ARTHROPLASTY Left 02/22/2018   REVERSE SHOULDER ARTHROPLASTY Left 02/22/2018   Procedure: LEFT REVERSE SHOULDER ARTHROPLASTY;  Surgeon: Cammy Copa, MD;  Location: [facility] OR;  Service: Orthopedics;  Laterality: Left;   TONSILLECTOMY AND ADENOIDECTOMY  TRANSCAROTID ARTERY REVASCULARIZATION  Left 01/17/2020   Procedure: TRANSCAROTID ARTERY REVASCULARIZATION;  Surgeon: Nada Libman, MD;  Location: [facility] OR;  Service: Vascular;  Laterality: Left;   TRIGGER FINGER RELEASE Left 05/12/2016   Procedure: RELEASE TRIGGER FINGER/A-1 PULLEY INFECTION LEFT RING TENDON SHEATH INJECT RIGHT INDEX FINGER;  Surgeon: Betha Loa, MD;  Location: [facility];  Service: Orthopedics;  Laterality: Left;   TUBAL LIGATION      Current Medications: Current Meds  Medication Sig   acetaminophen (TYLENOL) 325 MG tablet Take 2 tablets (650 mg total) by mouth every 6 (six) hours as needed for mild pain (or Fever >/= 101).   albuterol (VENTOLIN HFA) 108 (90 Base) MCG/ACT inhaler Inhale 2 puffs into the lungs every 6 (six) hours as needed for wheezing or shortness of breath.   alendronate (FOSAMAX) 70 MG tablet Take 70 mg by mouth once a week.   aspirin 81 MG EC tablet Take 1 tablet by mouth daily.   atorvastatin (LIPITOR) 40 MG tablet Take 40 mg by mouth at bedtime.   citalopram (CELEXA) 20 MG tablet Take 20 mg  by mouth daily.   diclofenac (VOLTAREN) 25 MG EC tablet Take 25 mg by mouth daily.   dicyclomine (BENTYL) 20 MG tablet Take 20 mg by mouth as needed for spasms.   EPINEPHrine 0.3 mg/0.3 mL IJ SOAJ injection Use as directed for life-threatening allergic reaction.   famotidine (PEPCID) 40 MG tablet TAKE ONE TABLET BY MOUTH EVERYDAY AT BEDTIME   Ferrous Sulfate (IRON PO) Take 1 tablet by mouth daily.   fluticasone (FLONASE) 50 MCG/ACT nasal spray Place 1-2 sprays into both nostrils as needed for allergies or rhinitis.   fluticasone-salmeterol (ADVAIR) 250-50 MCG/ACT AEPB Inhale one puff twice daily to prevent cough or wheeze. Rinse mouth after use.   gabapentin (NEURONTIN) 100 MG capsule Take 100 mg by mouth as needed (arthritis pain).   ipratropium (ATROVENT) 0.06 % nasal spray INSTILL 1-2 SPRAYS IN EACH NOSTRIL EVERY 6 HOURS IF needed   ipratropium-albuterol (DUONEB) 0.5-2.5 (3) MG/3ML SOLN Take 3 mLs by nebulization every 6 (six) hours as needed for wheezing or shortness of breath (shortness of breath/wheezing).   levocetirizine (XYZAL) 5 MG tablet Take 5 mg by mouth at bedtime.    metoprolol succinate (TOPROL-XL) 25 MG 24 hr tablet Take 25 mg by mouth at bedtime.   montelukast (SINGULAIR) 10 MG tablet Take 10 mg by mouth daily.   omeprazole (PRILOSEC) 40 MG capsule Take 40 mg by mouth 2 (two) times daily.   Probiotic Product (PROBIOTIC DAILY PO) Take 1 capsule by mouth daily.    Semaglutide,0.25 or 0.5MG /DOS, (OZEMPIC, 0.25 OR 0.5 MG/DOSE,) 2 MG/1.5ML SOPN Inject 0.25 mg into the skin every 7 (seven) days.   tamsulosin (FLOMAX) 0.4 MG CAPS capsule Take 0.4 mg by mouth daily.   Telmisartan-amLODIPine 80-5 MG TABS Take 1 tablet by mouth daily.   Current Facility-Administered Medications for the 05/22/23 encounter (Office Visit) with Compton Brigance, Aundra Dubin, MD  Medication   tezepelumab-ekko (TEZSPIRE) 210 MG/1. syringe 210 mg     Allergies:   Codeine and Sulfa antibiotics   Social History    Socioeconomic History   Marital status: Married    Spouse name: Not on file   Number of children: 2   Years of education: Not on file   Highest education level: Not on file  Occupational History   Occupation: retired  Tobacco Use   Smoking status: Never   Smokeless tobacco: Never  Vaping Use   Vaping Use: Never used  Substance and Sexual Activity   Alcohol use: No   Drug use: No   Sexual activity: Not on file  Other Topics Concern   Not on file  Social History Narrative   Daily caffeine use.    Social Determinants of Health   Financial Resource Strain: Not on file  Food Insecurity: Not on file  Transportation Needs: Not on file  Physical Activity: Not on file  Stress: Not on file  Social Connections: Not on file     Family History: The patient's family history includes Diabetes in her maternal grandmother and mother; Esophageal cancer in her father. There is no history of Colon cancer, Rectal cancer, or Stomach cancer.  ROS:   Please see the history of present illness.    All other systems reviewed and are negative.  EKGs/Labs/Other Studies Reviewed:    The following studies were reviewed today: I discussed my findings with the patient at length.   Recent Labs: No results found for requested labs within last 365 days.  Recent Lipid Panel    Component Value Date/Time   CHOL 139 07/21/2020 0856   TRIG 92 07/21/2020 0856   HDL 59 07/21/2020 0856   CHOLHDL 2.4 07/21/2020 0856   LDLCALC 63 07/21/2020 0856    Physical Exam:    VS:  BP (!) 142/58   Pulse 60   Ht 5' (1.524 m)   Wt 144 lb (65.3 kg)   SpO2 95%   BMI 28.12 kg/m     Wt Readings from Last 3 Encounters:  05/22/23 144 lb (65.3 kg)  01/02/23 144 lb 9.6 oz (65.6 kg)  11/18/22 149 lb 6.4 oz (67.8 kg)     GEN: Patient is in no acute distress HEENT: Normal NECK: No JVD; No carotid bruits LYMPHATICS: No lymphadenopathy CARDIAC: Hear sounds regular, 2/6 systolic murmur at the  apex. RESPIRATORY:  Clear to auscultation without rales, wheezing or rhonchi  ABDOMEN: Soft, non-tender, non-distended MUSCULOSKELETAL:  No edema; No deformity  SKIN: Warm and dry NEUROLOGIC:  Alert and oriented x 3 PSYCHIATRIC:  Normal affect   Signed, Garwin Brothers, MD  05/22/2023 11:32 AM    [facility]

## 2023-05-22 NOTE — Patient Outreach (Signed)
  Care Coordination   Initial Visit Note   05/22/2023 Name: [name] MRN: [id number] DOB: June 26, 1946  [name] is a 77 y.o. year old female who sees Buckner Malta, MD for primary care. I spoke with  Jannet Mantis by phone today.  What matters to the patients health and wellness today?  Placed call to patient to offer and review [employer] care coordination program. Patient reports that she is doing well. Reports last A1c of 5.7.  Reports MD stopped Actos.   Reports she has not had any episodes of low CBG since stopping Actos.   Denies any current needs.     SDOH assessments and interventions completed:  No     Care Coordination Interventions:  No, not indicated   Follow up plan: No further intervention required.   Encounter Outcome:  Pt. Refused   Rowe Pavy, RN, BSN, CEN [facility] [phone]

## 2023-05-22 NOTE — Patient Instructions (Signed)
Medication Instructions:  Your physician recommends that you continue on your current medications as directed. Please refer to the Current Medication list given to you today.  *If you need a refill on your cardiac medications before your next appointment, please call your pharmacy*   Lab Work: None ordered If you have labs (blood work) drawn today and your tests are completely normal, you will receive your results only by: MyChart Message (if you have MyChart) OR A paper copy in the mail If you have any lab test that is abnormal or we need to change your treatment, we will call you to review the results.   Testing/Procedures: None ordered   Follow-Up: At [facility], you and your health needs are our priority.  As part of our continuing mission to provide you with exceptional heart care, we have created designated Provider Care Teams.  These Care Teams include your primary Cardiologist (physician) and Advanced Practice Providers (APPs -  Physician Assistants and Nurse Practitioners) who all work together to provide you with the care you need, when you need it.  We recommend signing up for the patient portal called "MyChart".  Sign up information is provided on this After Visit Summary.  MyChart is used to connect with patients for Virtual Visits (Telemedicine).  Patients are able to view lab/test results, encounter notes, upcoming appointments, etc.  Non-urgent messages can be sent to your provider as well.   To learn more about what you can do with MyChart, go to [email or web address].    Your next appointment:   9 month(s)  The format for your next appointment:   In Person  Provider:   Rajan Revankar, MD    Other Instructions none  Important Information About Sugar       

## 2023-05-23 ENCOUNTER — Encounter (HOSPITAL_BASED_OUTPATIENT_CLINIC_OR_DEPARTMENT_OTHER): Payer: Self-pay | Admitting: Pulmonary Disease

## 2023-05-23 ENCOUNTER — Ambulatory Visit (HOSPITAL_BASED_OUTPATIENT_CLINIC_OR_DEPARTMENT_OTHER): Payer: Medicare HMO | Admitting: Pulmonary Disease

## 2023-05-23 VITALS — BP 122/68 | HR 46 | Temp 98.1°F | Ht 60.0 in | Wt 143.2 lb

## 2023-05-23 DIAGNOSIS — J479 Bronchiectasis, uncomplicated: Secondary | ICD-10-CM

## 2023-05-23 DIAGNOSIS — J4489 Other specified chronic obstructive pulmonary disease: Secondary | ICD-10-CM | POA: Diagnosis not present

## 2023-05-23 DIAGNOSIS — G4733 Obstructive sleep apnea (adult) (pediatric): Secondary | ICD-10-CM | POA: Diagnosis not present

## 2023-05-23 DIAGNOSIS — Z862 Personal history of diseases of the blood and blood-forming organs and certain disorders involving the immune mechanism: Secondary | ICD-10-CM | POA: Diagnosis not present

## 2023-05-23 DIAGNOSIS — R634 Abnormal weight loss: Secondary | ICD-10-CM | POA: Diagnosis not present

## 2023-05-23 MED ORDER — SPIRIVA RESPIMAT 2.5 MCG/ACT IN AERS: 2.0000 | INHALATION_SPRAY | Freq: Every day | RESPIRATORY_TRACT | 5 refills | Status: DC

## 2023-05-23 NOTE — Patient Instructions (Addendum)
Spiriva two puffs daily  Will arrange for a home sleep study  Try using Ayr nasal gel to help with nasal irritation  Follow up in 4 months

## 2023-05-23 NOTE — Progress Notes (Signed)
[facility]  Chief Complaint  Patient presents with   Follow-up    Follow up. Patient stated she hasn't felt too good today.    Constitutional:  BP 122/68 (BP Location: Right Arm, Patient Position: Sitting, Cuff Size: Normal)   Pulse (!) 46   Temp 98.1 F (36.7 C) (Oral)   Ht 5' (1.524 m)   Wt 143 lb 3.2 oz (65 kg)   SpO2 95%   BMI 27.97 kg/m   Past Medical History:  Allergies, Anxiety, Depression, Hiatal hernia, DM, GERD, Colon polyp, Nephrolithiasis, HLD, IBS, Sarcoidosis, HTN  Past Surgical History:  She  has a past surgical history that includes Partial hysterectomy; Tonsillectomy and adenoidectomy; Nasal septum surgery; Tubal ligation; Hemorrhoid surgery; Bladder surgery; Cataract extraction (Bilateral); Trigger finger release (Left, 05/12/2016); Hardware Removal (Left, 01/12/2017); Reverse shoulder arthroplasty (Left, 02/22/2018); Reverse shoulder arthroplasty (Left, 02/22/2018); and Transcarotid artery revascularization (Left, 01/17/2020).  Brief Summary:  [name] is a 77 y.o. female with obstructive sleep apnea, COPD with asthma, and sarcoidosis.      Subjective:   She is here with her husband.    She wasn't able to get spiriva.  From what she remembers it was too expensive, but she isn't sure the spiriva was the expensive medicine.  She fell recently and hit her nose.  She has sinus drainage.    Her nose gets very dry and irritated while using CPAP.  She didn't know how to change the humidifier setting.  She has lost about 20 lbs and is wondering if she still needs CPAP.  Physical Exam:   Appearance - well kempt   ENMT - no sinus tenderness, no oral exudate, no LAN, Mallampati 3 airway, no stridor  Respiratory - equal breath sounds bilaterally, no wheezing or rales  CV - s1s2 regular rate and rhythm, no murmurs  Ext - no clubbing, no edema  Skin - no rashes  Psych - normal mood and affect       Pulmonary testing:  Lymph node bx 04/27/01 >> non-caseating granuloma Spirometry 08/06/18 >> FEV1 0.79 (42%), FEV1% 65 PFT 11/02/18 >> FEV1 1.03 (53%), FEV1% 65, TLC 3.45 (76%), DLCO 60%, + BD Spirometry 10/27/21 >> FEV1 1.03 (57%), FEV1% 57  Chest Imaging:  HRCT chest 09/28/18 >> atherosclerosis, 6 mm nodule RML, 10 mm nodule LUL both stable since 2016; calcified granulomas, mild patchy air trapping, moderate varicoid BTX, mild subpleural reticulation CT chest 10/16/20 >> no significant change CT chest 11/18/22 >> coronary calcifications, borderline LAN, small Lt effusion, unchanged BTX, new GGO and nodular consolidation b/l  Sleep Tests:  HST 09/27/18 >> AHI 32.4, SpO2 low 76%. Auto CPAP 11/15/22 to 12/14/22 >> used on 5 of 30 nights with average 4 hrs 23 min.  Average AHI 2.8 with median CPAP 9 and 95 th percentile CPAP 12 cm H2O  Cardiac Tests:  Echo 07/02/19 >> EF 60 to 65%, mild/mod MR, mild AS  Social History:  She  reports that she has never smoked. She has never used smokeless tobacco. She reports that she does not drink alcohol and does not use drugs.  Family History:  Her family history includes Diabetes in her maternal grandmother and mother; Esophageal cancer in her father.     Assessment/Plan:   Obstructive sleep apnea. - she is compliant with CPAP and reports benefit - she uses [employer] for her DME - current CPAP ordered November 2019 - continue auto CPAP 5 to 15 cm H2O -  she has lost weight and is wondering if she still needs CPAP - will arrange for a home sleep study  - if she still has significant sleep apnea, will then need to arrange for a new auto CPAP machine later in the Fall - discussed how to adjust her CPAP humidifier  History of pulmonary sarcoidosis with bronchiectasis. - bronchial hygiene  COPD with asthma. - followed by Dr. Lucie Leather with [facility] and remains on tezspire injection - symptoms worse since she had to stop trelegy due to  insurance coverage - will try sending script for spiriva again - continue advair, singulair   Rheumatic valve disease, hypertension. - followed by Dr. Tomie China with [facility]  Time Spent Involved in Patient Care on Day of Examination:  39 minutes  Follow up:   Patient Instructions  Spiriva two puffs daily  Will arrange for a home sleep study  Try using Ayr nasal gel to help with nasal irritation  Follow up in 4 months   Medication List:   Allergies as of 05/23/2023       Reactions   Codeine Nausea And Vomiting   Sulfa Antibiotics Other (See Comments)   Allergic per pt's mother         Medication List        Accurate as of May 23, 2023  1:37 PM. If you have any questions, ask your nurse or doctor.          acetaminophen 325 MG tablet Commonly known as: TYLENOL Take 2 tablets (650 mg total) by mouth every 6 (six) hours as needed for mild pain (or Fever >/= 101).   albuterol 108 (90 Base) MCG/ACT inhaler Commonly known as: VENTOLIN HFA Inhale 2 puffs into the lungs every 6 (six) hours as needed for wheezing or shortness of breath.   alendronate 70 MG tablet Commonly known as: FOSAMAX Take 70 mg by mouth once a week.   aspirin EC 81 MG tablet Take 1 tablet by mouth daily.   atorvastatin 40 MG tablet Commonly known as: LIPITOR Take 40 mg by mouth at bedtime.   citalopram 20 MG tablet Commonly known as: CELEXA Take 20 mg by mouth daily.   diclofenac 25 MG EC tablet Commonly known as: VOLTAREN Take 25 mg by mouth daily.   dicyclomine 20 MG tablet Commonly known as: BENTYL Take 20 mg by mouth as needed for spasms.   EPINEPHrine 0.3 mg/0.3 mL Soaj injection Commonly known as: EPI-PEN Use as directed for life-threatening allergic reaction.   famotidine 40 MG tablet Commonly known as: PEPCID TAKE ONE TABLET BY MOUTH EVERYDAY AT BEDTIME   fluticasone 50 MCG/ACT nasal spray Commonly known as: FLONASE Place 1-2 sprays into both nostrils as  needed for allergies or rhinitis.   fluticasone-salmeterol 250-50 MCG/ACT Aepb Commonly known as: ADVAIR Inhale one puff twice daily to prevent cough or wheeze. Rinse mouth after use.   gabapentin 100 MG capsule Commonly known as: NEURONTIN Take 100 mg by mouth as needed (arthritis pain).   ipratropium 0.06 % nasal spray Commonly known as: ATROVENT INSTILL 1-2 SPRAYS IN EACH NOSTRIL EVERY 6 HOURS IF needed   ipratropium-albuterol 0.5-2.5 (3) MG/3ML Soln Commonly known as: DUONEB Take 3 mLs by nebulization every 6 (six) hours as needed for wheezing or shortness of breath (shortness of breath/wheezing).   IRON PO Take 1 tablet by mouth daily.   levocetirizine 5 MG tablet Commonly known as: XYZAL Take 5 mg by mouth at bedtime.   metoprolol  succinate 25 MG 24 hr tablet Commonly known as: TOPROL-XL Take 25 mg by mouth at bedtime.   montelukast 10 MG tablet Commonly known as: SINGULAIR Take 10 mg by mouth daily.   omeprazole 40 MG capsule Commonly known as: PRILOSEC Take 40 mg by mouth 2 (two) times daily.   Ozempic (0.25 or 0.5 MG/DOSE) 2 MG/1.5ML Sopn Generic drug: Semaglutide(0.25 or 0.5MG /DOS) Inject 0.25 mg into the skin every 7 (seven) days.   PROBIOTIC DAILY PO Take 1 capsule by mouth daily.   Spiriva Respimat 2.5 MCG/ACT Aers Generic drug: Tiotropium Bromide Monohydrate Inhale 2 puffs into the lungs daily. Started by: Coralyn Helling, MD   tamsulosin 0.4 MG Caps capsule Commonly known as: FLOMAX Take 0.4 mg by mouth daily.   Telmisartan-amLODIPine 80-5 MG Tabs Take 1 tablet by mouth daily.        Signature:  Coralyn Helling, MD [facility]monary/Critical Care Pager - [phone] 05/23/2023, 1:37 PM

## 2023-05-24 ENCOUNTER — Other Ambulatory Visit (HOSPITAL_COMMUNITY): Payer: Self-pay

## 2023-05-24 ENCOUNTER — Telehealth: Payer: Self-pay

## 2023-05-24 NOTE — Telephone Encounter (Signed)
Patient Advocate Encounter   Received notification from [facility] that prior authorization is required for Spiriva Respimat 2.5MCG/ACT aerosol   Submitted: n/a Key [id number]  PA not submitted at this time. Awaiting response from office.

## 2023-05-30 NOTE — Telephone Encounter (Signed)
Patient is returning a call.  Please call patient back at [phone]

## 2023-05-30 NOTE — Telephone Encounter (Signed)
Called patient but she did not answer. Left message for her to call us back.  

## 2023-06-06 MED ORDER — INCRUSE ELLIPTA 62.5 MCG/ACT IN AEPB: 1.0000 | INHALATION_SPRAY | Freq: Every day | RESPIRATORY_TRACT | 4 refills | Status: DC

## 2023-06-06 NOTE — Telephone Encounter (Addendum)
Pt is still awaiting a call back, pls advise. Pt is willing to pay $87.03.

## 2023-06-06 NOTE — Telephone Encounter (Signed)
Okay to send script for incruse one puff daily. 

## 2023-06-06 NOTE — Telephone Encounter (Signed)
Dr. Craige Cotta, are you ok with her switching to Incruse?

## 2023-06-06 NOTE — Telephone Encounter (Signed)
Called and spoke with patient. She verbalized understanding of Incruse. Confirmed her [facility] is [facility]. RX has been sent.   Nothing further needed at time of call.

## 2023-06-07 ENCOUNTER — Other Ambulatory Visit: Payer: Self-pay | Admitting: Allergy and Immunology

## 2023-06-07 DIAGNOSIS — Z6826 Body mass index (BMI) 26.0-26.9, adult: Secondary | ICD-10-CM | POA: Diagnosis not present

## 2023-06-07 DIAGNOSIS — E1165 Type 2 diabetes mellitus with hyperglycemia: Secondary | ICD-10-CM | POA: Diagnosis not present

## 2023-06-07 DIAGNOSIS — Z1331 Encounter for screening for depression: Secondary | ICD-10-CM | POA: Diagnosis not present

## 2023-06-07 DIAGNOSIS — D860 Sarcoidosis of lung: Secondary | ICD-10-CM | POA: Diagnosis not present

## 2023-06-07 DIAGNOSIS — K580 Irritable bowel syndrome with diarrhea: Secondary | ICD-10-CM | POA: Diagnosis not present

## 2023-06-07 DIAGNOSIS — D692 Other nonthrombocytopenic purpura: Secondary | ICD-10-CM | POA: Diagnosis not present

## 2023-06-07 DIAGNOSIS — N1831 Chronic kidney disease, stage 3a: Secondary | ICD-10-CM | POA: Diagnosis not present

## 2023-06-07 DIAGNOSIS — J301 Allergic rhinitis due to pollen: Secondary | ICD-10-CM | POA: Diagnosis not present

## 2023-06-07 DIAGNOSIS — J4540 Moderate persistent asthma, uncomplicated: Secondary | ICD-10-CM | POA: Diagnosis not present

## 2023-06-11 DIAGNOSIS — G4730 Sleep apnea, unspecified: Secondary | ICD-10-CM | POA: Diagnosis not present

## 2023-06-12 ENCOUNTER — Ambulatory Visit (INDEPENDENT_AMBULATORY_CARE_PROVIDER_SITE_OTHER): Payer: Medicare HMO | Admitting: *Deleted

## 2023-06-12 DIAGNOSIS — J4550 Severe persistent asthma, uncomplicated: Secondary | ICD-10-CM

## 2023-06-13 ENCOUNTER — Ambulatory Visit: Payer: Medicare HMO | Admitting: Podiatry

## 2023-06-16 DIAGNOSIS — R350 Frequency of micturition: Secondary | ICD-10-CM | POA: Diagnosis not present

## 2023-06-16 DIAGNOSIS — N3946 Mixed incontinence: Secondary | ICD-10-CM | POA: Diagnosis not present

## 2023-06-19 ENCOUNTER — Ambulatory Visit: Payer: Medicare HMO | Admitting: Podiatry

## 2023-06-19 DIAGNOSIS — B351 Tinea unguium: Secondary | ICD-10-CM

## 2023-06-19 DIAGNOSIS — M79675 Pain in left toe(s): Secondary | ICD-10-CM | POA: Diagnosis not present

## 2023-06-19 DIAGNOSIS — M79674 Pain in right toe(s): Secondary | ICD-10-CM | POA: Diagnosis not present

## 2023-06-19 DIAGNOSIS — E119 Type 2 diabetes mellitus without complications: Secondary | ICD-10-CM

## 2023-06-19 NOTE — Progress Notes (Signed)
  Subjective:  Patient ID: [name], female    DOB: 09-23-1946,  MRN: [id number]  [facility]  77 y.o. female presents with the above complaint. History confirmed with patient. Patient presenting with pain related to dystrophic thickened elongated nails. Patient is unable to trim own nails related to nail dystrophy and/or mobility issues. Patient does have a history of T2DM without peripheral neuropathy.   Objective:  Physical Exam: warm, good capillary refill, DP and PT pulses 2/4 bilateral nail exam onychomycosis of the toenails, onycholysis, and dystrophic nails DP pulses palpable, PT pulses palpable, and protective sensation intact Left Foot:  Pain with palpation of nails due to elongation and dystrophic growth.  Right Foot: Pain with palpation of nails due to elongation and dystrophic growth.  Assessment:   1. Pain due to onychomycosis of toenails of both feet   2. Diabetes mellitus type II, non insulin dependent (HCC)     Plan:  Patient was evaluated and treated and all questions answered.  #Onychomycosis with pain  -Nails palliatively debrided as below. -Educated on self-care  Procedure: Nail Debridement Rationale: Pain Type of Debridement: manual, sharp debridement. Instrumentation: Nail nipper, rotary burr. Number of Nails: 10  Return in about 3 months (around 09/19/2023) for [facility].         Corinna Gab, DPM [facility] / [facility]

## 2023-06-22 ENCOUNTER — Encounter (INDEPENDENT_AMBULATORY_CARE_PROVIDER_SITE_OTHER): Payer: Medicare HMO

## 2023-06-22 ENCOUNTER — Telehealth: Payer: Self-pay | Admitting: Pulmonary Disease

## 2023-06-22 DIAGNOSIS — G4733 Obstructive sleep apnea (adult) (pediatric): Secondary | ICD-10-CM

## 2023-06-22 DIAGNOSIS — R634 Abnormal weight loss: Secondary | ICD-10-CM

## 2023-06-22 NOTE — Telephone Encounter (Signed)
HST 06/11/23 >> AHI 34.7, SpO2 low 70%   Please inform her that her sleep study shows severe obstructive sleep apnea.  She needs to continue using CPAP at night.

## 2023-06-23 NOTE — Telephone Encounter (Signed)
Spoke with pt and notified of results per Dr. Sood. Pt verbalized understanding and denied any questions.  

## 2023-07-01 DIAGNOSIS — J189 Pneumonia, unspecified organism: Secondary | ICD-10-CM | POA: Diagnosis not present

## 2023-07-05 ENCOUNTER — Telehealth: Payer: Self-pay | Admitting: Internal Medicine

## 2023-07-05 NOTE — Telephone Encounter (Signed)
Hi Dr. [name],  Supervising Provider: 07/05/23   We received a referral for patient to be evaluated for fecal incontinence. She does have GI history with [facility]. The patent is seeking a transfer of care over to [facility] due to preference. Her records are available via Epic for you to review and advise on scheduling.    Thanks

## 2023-07-06 NOTE — Telephone Encounter (Signed)
Called patient to schedule no answer.

## 2023-07-07 DIAGNOSIS — N3000 Acute cystitis without hematuria: Secondary | ICD-10-CM | POA: Diagnosis not present

## 2023-07-07 DIAGNOSIS — R350 Frequency of micturition: Secondary | ICD-10-CM | POA: Diagnosis not present

## 2023-07-10 ENCOUNTER — Ambulatory Visit (INDEPENDENT_AMBULATORY_CARE_PROVIDER_SITE_OTHER): Payer: Medicare HMO | Admitting: *Deleted

## 2023-07-10 DIAGNOSIS — J4550 Severe persistent asthma, uncomplicated: Secondary | ICD-10-CM

## 2023-07-11 ENCOUNTER — Encounter: Payer: Self-pay | Admitting: Internal Medicine

## 2023-07-12 DIAGNOSIS — J3489 Other specified disorders of nose and nasal sinuses: Secondary | ICD-10-CM | POA: Diagnosis not present

## 2023-07-12 DIAGNOSIS — R062 Wheezing: Secondary | ICD-10-CM | POA: Diagnosis not present

## 2023-07-12 DIAGNOSIS — Z6826 Body mass index (BMI) 26.0-26.9, adult: Secondary | ICD-10-CM | POA: Diagnosis not present

## 2023-07-12 DIAGNOSIS — L0100 Impetigo, unspecified: Secondary | ICD-10-CM | POA: Diagnosis not present

## 2023-08-02 DIAGNOSIS — G4733 Obstructive sleep apnea (adult) (pediatric): Secondary | ICD-10-CM | POA: Diagnosis not present

## 2023-08-07 ENCOUNTER — Ambulatory Visit (INDEPENDENT_AMBULATORY_CARE_PROVIDER_SITE_OTHER): Payer: Medicare HMO | Admitting: *Deleted

## 2023-08-07 DIAGNOSIS — J4550 Severe persistent asthma, uncomplicated: Secondary | ICD-10-CM | POA: Diagnosis not present

## 2023-08-16 DIAGNOSIS — D860 Sarcoidosis of lung: Secondary | ICD-10-CM | POA: Diagnosis not present

## 2023-08-16 DIAGNOSIS — L0100 Impetigo, unspecified: Secondary | ICD-10-CM | POA: Diagnosis not present

## 2023-08-16 DIAGNOSIS — Z6826 Body mass index (BMI) 26.0-26.9, adult: Secondary | ICD-10-CM | POA: Diagnosis not present

## 2023-08-23 ENCOUNTER — Other Ambulatory Visit (INDEPENDENT_AMBULATORY_CARE_PROVIDER_SITE_OTHER): Payer: Medicare HMO

## 2023-08-23 ENCOUNTER — Ambulatory Visit: Payer: Medicare HMO | Admitting: Orthopedic Surgery

## 2023-08-23 DIAGNOSIS — M25562 Pain in left knee: Secondary | ICD-10-CM

## 2023-08-23 DIAGNOSIS — M170 Bilateral primary osteoarthritis of knee: Secondary | ICD-10-CM | POA: Diagnosis not present

## 2023-08-23 DIAGNOSIS — M1711 Unilateral primary osteoarthritis, right knee: Secondary | ICD-10-CM

## 2023-08-23 DIAGNOSIS — M25561 Pain in right knee: Secondary | ICD-10-CM

## 2023-08-23 DIAGNOSIS — M1712 Unilateral primary osteoarthritis, left knee: Secondary | ICD-10-CM

## 2023-08-24 ENCOUNTER — Encounter: Payer: Self-pay | Admitting: Orthopedic Surgery

## 2023-08-24 MED ORDER — METHYLPREDNISOLONE ACETATE 40 MG/ML IJ SUSP
40.0000 mg | INTRAMUSCULAR | Status: AC | PRN
  Administered 2023-08-23: 40 mg via INTRA_ARTICULAR

## 2023-08-24 MED ORDER — BUPIVACAINE HCL 0.25 % IJ SOLN
4.0000 mL | INTRAMUSCULAR | Status: AC | PRN
  Administered 2023-08-23: 4 mL via INTRA_ARTICULAR

## 2023-08-24 MED ORDER — LIDOCAINE HCL 1 % IJ SOLN
5.0000 mL | INTRAMUSCULAR | Status: AC | PRN
  Administered 2023-08-23: 5 mL

## 2023-08-24 NOTE — Progress Notes (Signed)
Office Visit Note   Patient: [name]           Date of Birth: 1946-09-27           MRN: [id number] Visit Date: 08/23/2023 Requested by: Buckner Malta, MD [address] [place],  [place] [address] PCP: Buckner Malta, MD  Subjective: Chief Complaint  Patient presents with   Left Ankle - Pain   Other    Bilateral leg pain     HPI: [name] is a 77 y.o. female who presents to the office reporting bilateral leg and calf pain as well as knee pain.  Right equal to left.  Reports stiffness and difficulty with ambulation after sitting.  Has some occasional right-sided low back pain.  Also having left ankle pain.  Prior radiographs on that was normal.  Takes Tylenol PM without much relief.  Did take some Naprosyn but was told she should not take that due to her kidney issues.  She has had 2-3 falls since her last visit here.  Denies any pain at rest in the ankle but does report some pain in the knees.  She does report decreased walking and standing endurance.  She had injections in both knees 6 months ago.  She would consider another injection in her back.  That has helped her in the past.  She does report a lot of calf pain associated with ambulation..                ROS: All systems reviewed are negative as they relate to the chief complaint within the history of present illness.  Patient denies fevers or chills.  Assessment & Plan: Visit Diagnoses:  1. Pain in both knees, unspecified chronicity   2. Primary osteoarthritis of right knee   3. Unilateral primary osteoarthritis, left knee     Plan: Impression is bilateral knee pain with some calf pain.  She has easily palpable pedal pulses as well as some mild degenerative changes in her back.  Not really clear-cut for either neurogenic or vascular claudication.  Bilateral knees were injected today.  Offered her MRI scan of the left ankle so we can get a better look at the soft tissues because she does have mild  swelling in the joint but no radiographic or examination abnormalities.  She is going to try another back injection with Dr. Alvester Morin.  Will see her back as needed.  I did tell her that she would know when her symptoms were bad enough for knee replacement.  They are not quite there yet according to her.  Follow-Up Instructions: No follow-ups on file.   Orders:  Orders Placed This Encounter  Procedures   XR KNEE 3 VIEW LEFT   XR Knee 1-2 Views Right   No orders of the defined types were placed in this encounter.     Procedures: Large Joint Inj: bilateral knee on 08/23/2023 5:39 PM Indications: diagnostic evaluation, joint swelling and pain Details: 18 G 1.5 in needle, superolateral approach  Arthrogram: No  Medications (Right): 5 mL lidocaine 1 %; 4 mL bupivacaine 0.25 %; 40 mg methylPREDNISolone acetate 40 MG/ML Medications (Left): 5 mL lidocaine 1 %; 4 mL bupivacaine 0.25 %; 40 mg methylPREDNISolone acetate 40 MG/ML Outcome: tolerated well, no immediate complications Procedure, treatment alternatives, risks and benefits explained, specific risks discussed. Consent was given by the patient. Immediately prior to procedure a time out was called to verify the correct patient, procedure, equipment, support staff and site/side marked  as required. Patient was prepped and draped in the usual sterile fashion.       Clinical Data: No additional findings.  Objective: Vital Signs: There were no vitals taken for this visit.  Physical Exam:  Constitutional: Patient appears well-developed HEENT:  Head: Normocephalic Eyes:EOM are normal Neck: Normal range of motion Cardiovascular: Normal rate Pulmonary/chest: Effort normal Neurologic: Patient is alert Skin: Skin is warm Psychiatric: Patient has normal mood and affect  Ortho Exam: Ortho exam demonstrates range of motion both knees of about 5-1 05.  Collateral crucial ligaments are stable.  Extensor mechanism intact.  Slight swelling  around the left ankle no swelling in the right ankle.  Patient has symmetric ankle dorsiflexion plantarflexion with palpable intact nontender functional anterior to posterior to peroneal and Achilles tendons bilaterally.  Pedal pulses 2+ out of 4 bilateral.  No groin pain with internal/external Tatian of the leg.  No definite nerve root tension signs and no paresthesias L1-S1 bilaterally.  Specialty Comments:  No specialty comments available.  Imaging: No results found.   PMFS History: Patient Active Problem List   Diagnosis Date Noted   Closed fracture of nasal bones 04/26/2023   Mixed conductive and sensorineural hearing loss of right ear with restricted hearing of left ear 11/08/2022   Change in bowel function 11/07/2022   Mallory-Weiss tear 07/03/2022   Allergy    Anxiety    Arthritis    Cataract    Depression    DM (diabetes mellitus) (HCC)    Dyspnea    Heart murmur    History of kidney stones    Hyperlipidemia    Kidney stones    Sleep apnea    Status post dilation of esophageal narrowing    Asthma 01/16/2020   History of transient ischemic attack (TIA) 01/16/2020   Bilateral carotid artery stenosis 01/16/2020   Normocytic anemia 01/16/2020   Diabetes mellitus type II, non insulin dependent (HCC) 07/05/2019   Mild aortic stenosis 07/05/2019   Mitral regurgitation 07/05/2019   Pedal edema 05/06/2019   DOE (dyspnea on exertion) 05/06/2019   Hearing loss, mixed, bilateral 12/17/2018   Bilateral primary osteoarthritis of knee 10/16/2018   OSA (obstructive sleep apnea) 09/28/2018   Shoulder arthritis 02/22/2018   Primary osteoarthritis, left shoulder 12/11/2017   DDD (degenerative disc disease), lumbar 01/23/2017   History of diabetes mellitus 01/23/2017   History of depression 01/23/2017   History of asthma 01/23/2017   Dyslipidemia 01/23/2017   Age-related osteoporosis without current pathological fracture 01/23/2017   History of gastroesophageal reflux (GERD)  01/17/2017   Primary osteoarthritis of both knees 01/17/2017   Primary osteoarthritis of both feet 01/17/2017   Vitamin D deficiency 01/17/2017   Rheumatoid factor positive 12/13/2016   Retained orthopedic hardware 11/14/2016   Stiffness of left wrist joint 06/06/2016   Closed fracture of distal end of radius 06/05/2016   Acquired trigger finger 04/20/2016   Obstructive lung disease (HCC) 07/29/2015   H/O allergic rhinitis 07/29/2015   LPRD (laryngopharyngeal reflux disease) 07/29/2015   Chest pain 11/01/2013   Murmur 11/01/2013   DM 03/03/2010   ABDOMINAL PAIN RIGHT LOWER QUADRANT 03/03/2010   FULL INCONTINENCE OF FECES 03/03/2010   H/O adenomatous polyp of colon 2011   DECREASED HEARING 05/02/2008   Essential hypertension 05/02/2008   ESOPHAGITIS 05/02/2008   HIATAL HERNIA 05/02/2008   RECTAL INCONTINENCE 05/02/2008   SARCOIDOSIS 11/16/2007   Severe persistent asthma 11/16/2007   GERD 11/16/2007   IRRITABLE BOWEL SYNDROME 11/16/2007   Past Medical  History:  Diagnosis Date   ABDOMINAL PAIN RIGHT LOWER QUADRANT 03/03/2010   Qualifier: Diagnosis of  By: Juanda Chance MD, Hedwig Morton    Acquired trigger finger 04/20/2016   Age-related osteoporosis without current pathological fracture 01/23/2017   Allergy    Anxiety    Arthritis    Asthma    Bilateral carotid artery stenosis 01/16/2020   Bilateral primary osteoarthritis of knee 10/16/2018   Cataract    bilateral and removed   Change in bowel function 11/07/2022   Chest pain 11/01/2013   Closed fracture of distal end of radius 06/05/2016   Closed fracture of nasal bones 04/26/2023   Last Assessment & Plan: Formatting of this note might be different from the original. Nasal trauma. See history of present illness.  Denies nasal obstruction or concern over the appearance of the nose.  CT is reviewed and shows minimally displaced right nasal bone fracture. EXAM shows nasal dorsum in the midline.  Still little edema of the nasal dorsum.   Intranasal exam shows midline nasal septum.   DDD (degenerative disc disease), lumbar 01/23/2017   DECREASED HEARING 05/02/2008   Qualifier: Diagnosis of   By: Candice Camp CMA (AAMA), Dottie      Replacing diagnoses that were inactivated after the 02/27/23 regulatory import   Depression    Diabetes mellitus type II, non insulin dependent (HCC) 07/05/2019   DM 03/03/2010   Qualifier: Diagnosis of  By: Katrinka Blazing CMA, Tresa Endo     DM (diabetes mellitus) (HCC)    DOE (dyspnea on exertion) 05/06/2019   Dyslipidemia 01/23/2017   Dyspnea    Esophageal reflux    ESOPHAGITIS 05/02/2008   Qualifier: Diagnosis of   By: Candice Camp CMA (AAMA), Dottie      Replacing diagnoses that were inactivated after the 02/27/23 regulatory import   Essential hypertension 05/02/2008   Qualifier: Diagnosis of  By: Nelson-Smith CMA (AAMA), Dottie     Full incontinence of feces 03/03/2010   Qualifier: Diagnosis of  By: Juanda Chance MD, Hedwig Morton    H/O adenomatous polyp of colon 2011   H/O allergic rhinitis 07/29/2015   Hearing loss, mixed, bilateral 12/17/2018   Heart murmur    no cardiologist, family Dr. Zollie Scale   HIATAL HERNIA 05/02/2008   Qualifier: Diagnosis of   By: Candice Camp CMA (AAMA), Dottie      Replacing diagnoses that were inactivated after the 02/27/23 regulatory import   History of asthma 01/23/2017   History of depression 01/23/2017   History of diabetes mellitus 01/23/2017   History of gastroesophageal reflux (GERD) 01/17/2017   History of kidney stones    History of transient ischemic attack (TIA) 01/16/2020   Hyperlipidemia    Irritable bowel syndrome    Kidney stones    LPRD (laryngopharyngeal reflux disease) 07/29/2015   Mallory-Weiss tear 07/03/2022   Mild aortic stenosis 07/05/2019   Mitral regurgitation 07/05/2019   Mixed conductive and sensorineural hearing loss of right ear with restricted hearing of left ear 11/08/2022   Last Assessment & Plan: Formatting of this note might be different from  the original. Here for medical clearance for the right ear. Noted to have recent worsening of her right ear on audiogram at an outside facility.  Denies any pain.  In general, she is functioning well with her current hearing aid though she is looking at getting a new one.  Left ear with profound loss from prior surgery.  Audio   Murmur 11/01/2013   Normocytic anemia 01/16/2020   Obstructive lung disease (HCC)  07/29/2015   OSA (obstructive sleep apnea) 09/28/2018   Pain in left wrist 06/06/2016   Pedal edema 05/06/2019   Primary osteoarthritis of both feet 01/17/2017   Primary osteoarthritis of both knees 01/17/2017   Primary osteoarthritis, left shoulder 12/11/2017   RECTAL INCONTINENCE 05/02/2008   Qualifier: Diagnosis of   By: Candice Camp CMA (AAMA), Dottie      Replacing diagnoses that were inactivated after the 02/27/23 regulatory import   Retained orthopedic hardware 11/14/2016   Rheumatoid factor positive 12/13/2016   Sarcoidosis    Severe persistent asthma 11/16/2007   Annotation: chronic Qualifier: Diagnosis of  By: Sherene Sires MD, [name]    Shoulder arthritis 02/22/2018   Sleep apnea    no cpap now   Status post dilation of esophageal narrowing    Stiffness of left wrist joint 06/06/2016   Vitamin D deficiency 01/17/2017    Family History  Problem Relation Age of Onset   Esophageal cancer Father    Diabetes Mother    Diabetes Maternal Grandmother    Colon cancer Neg Hx    Rectal cancer Neg Hx    Stomach cancer Neg Hx     Past Surgical History:  Procedure Laterality Date   BLADDER SURGERY     CATARACT EXTRACTION Bilateral    HARDWARE REMOVAL Left 01/12/2017   Procedure: HARDWARE REMOVAL left wrist;  Surgeon: Betha Loa, MD;  Location: [facility];  Service: Orthopedics;  Laterality: Left;   HEMORRHOID SURGERY     NASAL SEPTUM SURGERY     PARTIAL HYSTERECTOMY     REVERSE SHOULDER ARTHROPLASTY Left 02/22/2018   REVERSE SHOULDER ARTHROPLASTY Left 02/22/2018    Procedure: LEFT REVERSE SHOULDER ARTHROPLASTY;  Surgeon: Cammy Copa, MD;  Location: [facility] OR;  Service: Orthopedics;  Laterality: Left;   TONSILLECTOMY AND ADENOIDECTOMY     TRANSCAROTID ARTERY REVASCULARIZATION  Left 01/17/2020   Procedure: TRANSCAROTID ARTERY REVASCULARIZATION;  Surgeon: Nada Libman, MD;  Location: [facility] OR;  Service: Vascular;  Laterality: Left;   TRIGGER FINGER RELEASE Left 05/12/2016   Procedure: RELEASE TRIGGER FINGER/A-1 PULLEY INFECTION LEFT RING TENDON SHEATH INJECT RIGHT INDEX FINGER;  Surgeon: Betha Loa, MD;  Location: [facility];  Service: Orthopedics;  Laterality: Left;   TUBAL LIGATION     Social History   Occupational History   Occupation: retired  Tobacco Use   Smoking status: Never   Smokeless tobacco: Never  Vaping Use   Vaping status: Never Used  Substance and Sexual Activity   Alcohol use: No   Drug use: No   Sexual activity: Not on file

## 2023-08-25 ENCOUNTER — Other Ambulatory Visit: Payer: Self-pay

## 2023-08-25 DIAGNOSIS — M545 Low back pain, unspecified: Secondary | ICD-10-CM

## 2023-08-29 DIAGNOSIS — H353131 Nonexudative age-related macular degeneration, bilateral, early dry stage: Secondary | ICD-10-CM | POA: Diagnosis not present

## 2023-08-29 DIAGNOSIS — E119 Type 2 diabetes mellitus without complications: Secondary | ICD-10-CM | POA: Diagnosis not present

## 2023-08-29 DIAGNOSIS — H5213 Myopia, bilateral: Secondary | ICD-10-CM | POA: Diagnosis not present

## 2023-08-29 DIAGNOSIS — Z961 Presence of intraocular lens: Secondary | ICD-10-CM | POA: Diagnosis not present

## 2023-08-30 DIAGNOSIS — N3946 Mixed incontinence: Secondary | ICD-10-CM | POA: Diagnosis not present

## 2023-08-30 DIAGNOSIS — N3000 Acute cystitis without hematuria: Secondary | ICD-10-CM | POA: Diagnosis not present

## 2023-09-01 DIAGNOSIS — G4733 Obstructive sleep apnea (adult) (pediatric): Secondary | ICD-10-CM | POA: Diagnosis not present

## 2023-09-04 ENCOUNTER — Ambulatory Visit: Payer: Medicare HMO

## 2023-09-04 DIAGNOSIS — J4550 Severe persistent asthma, uncomplicated: Secondary | ICD-10-CM

## 2023-09-18 DIAGNOSIS — D860 Sarcoidosis of lung: Secondary | ICD-10-CM | POA: Diagnosis not present

## 2023-09-18 DIAGNOSIS — Z6826 Body mass index (BMI) 26.0-26.9, adult: Secondary | ICD-10-CM | POA: Diagnosis not present

## 2023-09-18 DIAGNOSIS — Z23 Encounter for immunization: Secondary | ICD-10-CM | POA: Diagnosis not present

## 2023-09-18 DIAGNOSIS — J4540 Moderate persistent asthma, uncomplicated: Secondary | ICD-10-CM | POA: Diagnosis not present

## 2023-09-19 ENCOUNTER — Encounter: Payer: Self-pay | Admitting: Physical Medicine and Rehabilitation

## 2023-09-19 ENCOUNTER — Ambulatory Visit: Payer: Medicare HMO | Admitting: Physical Medicine and Rehabilitation

## 2023-09-19 DIAGNOSIS — M5416 Radiculopathy, lumbar region: Secondary | ICD-10-CM | POA: Diagnosis not present

## 2023-09-19 DIAGNOSIS — M5441 Lumbago with sciatica, right side: Secondary | ICD-10-CM | POA: Diagnosis not present

## 2023-09-19 DIAGNOSIS — M5442 Lumbago with sciatica, left side: Secondary | ICD-10-CM | POA: Diagnosis not present

## 2023-09-19 DIAGNOSIS — M47816 Spondylosis without myelopathy or radiculopathy, lumbar region: Secondary | ICD-10-CM

## 2023-09-19 DIAGNOSIS — G8929 Other chronic pain: Secondary | ICD-10-CM

## 2023-09-19 NOTE — Progress Notes (Signed)
[name] - 77 y.o. female MRN [id number]  Date of birth: 01-May-1946  Office Visit Note: Visit Date: 09/19/2023 PCP: Buckner Malta, MD Referred by: Buckner Malta, MD  Subjective: Chief Complaint  Patient presents with   Lower Back - Pain   HPI: [name] is a 77 y.o. female who comes in today per the request of Dr. Dorene Grebe for evaluation of chronic, worsening and severe bilateral lower back pain radiating to buttocks and down posterior legs down to knees. Pain ongoing for several years, worsens with walking, activity and laying flat in bed. She describes pain as sore and aching sensation, currently rates as 8 out of 10. Some relief of pain with home exercise regimen, rest and use of medications. No history of formal physical therapy. Lumbar MRI imaging from 2021 exhibits shallow broad-based central disc protrusion L3-L4 without significant stenosis. There is moderate facet degeneration at L4-L5. No high grade spinal canal stenosis noted. She reports history of lumbar injections with Dr. Alvester Morin many years ago that seems to significantly alleviate her pain. I am not able to locate these injections in EPIC or SRS. She did undergo left intra-articular hip injection with Dr. Alvester Morin in 2021 that provided some relief of pain.  She reports history of multiple falls, no falls recently. She does have history of osteoarthritis and osteoporosis.  She denies focal weakness, numbness and tingling.    Oswestry Disability Index Score 16% 0 to 10 (20%)   minimal disability: The patient can cope with most living activities. Usually no treatment is indicated apart from advice on lifting sitting and exercise.  Review of Systems  Musculoskeletal:  Positive for back pain.  Neurological:  Negative for tingling, sensory change, focal weakness and weakness.  All other systems reviewed and are negative.  Otherwise per HPI.  Assessment & Plan: Visit Diagnoses:    ICD-10-CM   1. Chronic  bilateral low back pain with bilateral sciatica  M54.42 MR LUMBAR SPINE WO CONTRAST   M54.41    G89.29     2. Lumbar radiculopathy  M54.16 MR LUMBAR SPINE WO CONTRAST    3. Facet arthropathy, lumbar  M47.816        Plan: Findings:  Chronic, worsening and severe bilateral lower back pain radiating to buttock and down posterior legs to knee. Patient continues to have severe pain despite good conservative therapies such as home exercise regimen, rest and use of medications. Patients clinical presentation and exam are consistent with S1 nerve roots. Her symptoms do not directly correlate with prior lumbar MRI imaging. We discussed treatment options in detail today, next step is to place order for lumbar MRI imaging. Depending on MRI imaging we discussed possibility of performing lumbar epidural steroid injection. I also feel she would benefit from short course of formal physical therapy. I will see her back for lumbar MRI review and to discuss treatment options. No red flag symptoms noted upon exam today.     Meds & Orders: No orders of the defined types were placed in this encounter.   Orders Placed This Encounter  Procedures   MR LUMBAR SPINE WO CONTRAST    Follow-up: Return for Lumbar MRI review.   Procedures: No procedures performed      Clinical History: MRI LUMBAR SPINE WITHOUT CONTRAST   TECHNIQUE: Multiplanar, multisequence MR imaging of the lumbar spine was performed. No intravenous contrast was administered.   COMPARISON:  Lumbar MRI 01/31/2013   FINDINGS: Segmentation:  Normal   Alignment:  Normal   Vertebrae:  Normal bone marrow.  Negative for fracture or mass   Conus medullaris and cauda equina: Conus extends to the L1-2 level. Conus and cauda equina appear normal.   Paraspinal and other soft tissues: Negative   Disc levels:   L1-2: Mild disc and mild facet degeneration. Negative for disc protrusion or stenosis.   L2-3: Mild disc bulging and mild facet  degeneration unchanged. Negative for disc protrusion or stenosis.   L3-4: Shallow broad-based central disc protrusion has developed since the prior study. Mild facet degeneration. Negative for stenosis   L4-5: Mild disc bulging and moderate facet degeneration with mild progression. Negative for stenosis.   L5-S1: Negative   IMPRESSION: 1. Mild degenerative changes at L1-2 and L2-3 without stenosis 2. Shallow broad-based central disc protrusion L3-4 has developed since the prior study without significant stenosis 3. Mild progression of disc and facet degeneration L4-5 without significant stenosis.     Electronically Signed   By: Marlan Palau M.D.   On: 04/09/2020 14:12   She reports that she has never smoked. She has never used smokeless tobacco. No results for input(s): "HGBA1C", "LABURIC" in the last 8760 hours.  Objective:  VS:  HT:    WT:   BMI:     BP:   HR: bpm  TEMP: ( )  RESP:  Physical Exam Vitals and nursing note reviewed.  HENT:     Head: Normocephalic and atraumatic.     Right Ear: External ear normal.     Left Ear: External ear normal.     Nose: Nose normal.     Mouth/Throat:     Mouth: Mucous membranes are moist.  Eyes:     Extraocular Movements: Extraocular movements intact.  Cardiovascular:     Rate and Rhythm: Normal rate.     Pulses: Normal pulses.  Pulmonary:     Effort: Pulmonary effort is normal.  Abdominal:     General: Abdomen is flat. There is no distension.  Musculoskeletal:        General: Tenderness present.     Cervical back: Normal range of motion.     Comments: Patient rises from seated position to standing without difficulty. Good lumbar range of motion. No pain noted with facet loading. 5/5 strength noted with bilateral hip flexion, knee flexion/extension, ankle dorsiflexion/plantarflexion and EHL. No clonus noted bilaterally. No pain upon palpation of greater trochanters. No pain with internal/external rotation of bilateral hips.  Sensation intact bilaterally. Dysesthesias noted to bilateral S1 dermatomes. Negative slump test bilaterally. Ambulates without aid, gait steady.     Skin:    General: Skin is warm and dry.     Capillary Refill: Capillary refill takes less than 2 seconds.  Neurological:     General: No focal deficit present.     Mental Status: She is alert and oriented to person, place, and time.  Psychiatric:        Mood and Affect: Mood normal.        Behavior: Behavior normal.     Ortho Exam  Imaging: No results found.  Past Medical/Family/Surgical/Social History: Medications & Allergies reviewed per EMR, new medications updated. Patient Active Problem List   Diagnosis Date Noted   Closed fracture of nasal bones 04/26/2023   Mixed conductive and sensorineural hearing loss of right ear with restricted hearing of left ear 11/08/2022   Change in bowel function 11/07/2022   Mallory-Weiss tear 07/03/2022   Allergy    Anxiety    Arthritis  Cataract    Depression    DM (diabetes mellitus) (HCC)    Dyspnea    Heart murmur    History of kidney stones    Hyperlipidemia    Kidney stones    Sleep apnea    Status post dilation of esophageal narrowing    Asthma 01/16/2020   History of transient ischemic attack (TIA) 01/16/2020   Bilateral carotid artery stenosis 01/16/2020   Normocytic anemia 01/16/2020   Diabetes mellitus type II, non insulin dependent (HCC) 07/05/2019   Mild aortic stenosis 07/05/2019   Mitral regurgitation 07/05/2019   Pedal edema 05/06/2019   DOE (dyspnea on exertion) 05/06/2019   Hearing loss, mixed, bilateral 12/17/2018   Bilateral primary osteoarthritis of knee 10/16/2018   OSA (obstructive sleep apnea) 09/28/2018   Shoulder arthritis 02/22/2018   Primary osteoarthritis, left shoulder 12/11/2017   DDD (degenerative disc disease), lumbar 01/23/2017   History of diabetes mellitus 01/23/2017   History of depression 01/23/2017   History of asthma 01/23/2017    Dyslipidemia 01/23/2017   Age-related osteoporosis without current pathological fracture 01/23/2017   History of gastroesophageal reflux (GERD) 01/17/2017   Primary osteoarthritis of both knees 01/17/2017   Primary osteoarthritis of both feet 01/17/2017   Vitamin D deficiency 01/17/2017   Rheumatoid factor positive 12/13/2016   Retained orthopedic hardware 11/14/2016   Stiffness of left wrist joint 06/06/2016   Closed fracture of distal end of radius 06/05/2016   Acquired trigger finger 04/20/2016   Obstructive lung disease (HCC) 07/29/2015   H/O allergic rhinitis 07/29/2015   LPRD (laryngopharyngeal reflux disease) 07/29/2015   Chest pain 11/01/2013   Murmur 11/01/2013   DM 03/03/2010   ABDOMINAL PAIN RIGHT LOWER QUADRANT 03/03/2010   FULL INCONTINENCE OF FECES 03/03/2010   H/O adenomatous polyp of colon 2011   DECREASED HEARING 05/02/2008   Essential hypertension 05/02/2008   ESOPHAGITIS 05/02/2008   HIATAL HERNIA 05/02/2008   RECTAL INCONTINENCE 05/02/2008   SARCOIDOSIS 11/16/2007   Severe persistent asthma 11/16/2007   GERD 11/16/2007   IRRITABLE BOWEL SYNDROME 11/16/2007   Past Medical History:  Diagnosis Date   ABDOMINAL PAIN RIGHT LOWER QUADRANT 03/03/2010   Qualifier: Diagnosis of  By: Juanda Chance MD, Hedwig Morton    Acquired trigger finger 04/20/2016   Age-related osteoporosis without current pathological fracture 01/23/2017   Allergy    Anxiety    Arthritis    Asthma    Bilateral carotid artery stenosis 01/16/2020   Bilateral primary osteoarthritis of knee 10/16/2018   Cataract    bilateral and removed   Change in bowel function 11/07/2022   Chest pain 11/01/2013   Closed fracture of distal end of radius 06/05/2016   Closed fracture of nasal bones 04/26/2023   Last Assessment & Plan: Formatting of this note might be different from the original. Nasal trauma. See history of present illness.  Denies nasal obstruction or concern over the appearance of the nose.  CT is  reviewed and shows minimally displaced right nasal bone fracture. EXAM shows nasal dorsum in the midline.  Still little edema of the nasal dorsum.  Intranasal exam shows midline nasal septum.   DDD (degenerative disc disease), lumbar 01/23/2017   DECREASED HEARING 05/02/2008   Qualifier: Diagnosis of   By: Nelson-Smith CMA (AAMA), Dottie      Replacing diagnoses that were inactivated after the 02/27/23 regulatory import   Depression    Diabetes mellitus type II, non insulin dependent (HCC) 07/05/2019   DM 03/03/2010   Qualifier: Diagnosis of  By: Katrinka Blazing CMA, Tresa Endo     DM (diabetes mellitus) (HCC)    DOE (dyspnea on exertion) 05/06/2019   Dyslipidemia 01/23/2017   Dyspnea    Esophageal reflux    ESOPHAGITIS 05/02/2008   Qualifier: Diagnosis of   By: Candice Camp CMA (AAMA), Dottie      Replacing diagnoses that were inactivated after the 02/27/23 regulatory import   Essential hypertension 05/02/2008   Qualifier: Diagnosis of  By: Nelson-Smith CMA (AAMA), Dottie     Full incontinence of feces 03/03/2010   Qualifier: Diagnosis of  By: Juanda Chance MD, Hedwig Morton    H/O adenomatous polyp of colon 2011   H/O allergic rhinitis 07/29/2015   Hearing loss, mixed, bilateral 12/17/2018   Heart murmur    no cardiologist, family Dr. Zollie Scale   HIATAL HERNIA 05/02/2008   Qualifier: Diagnosis of   By: Candice Camp CMA (AAMA), Dottie      Replacing diagnoses that were inactivated after the 02/27/23 regulatory import   History of asthma 01/23/2017   History of depression 01/23/2017   History of diabetes mellitus 01/23/2017   History of gastroesophageal reflux (GERD) 01/17/2017   History of kidney stones    History of transient ischemic attack (TIA) 01/16/2020   Hyperlipidemia    Irritable bowel syndrome    Kidney stones    LPRD (laryngopharyngeal reflux disease) 07/29/2015   Mallory-Weiss tear 07/03/2022   Mild aortic stenosis 07/05/2019   Mitral regurgitation 07/05/2019   Mixed conductive and sensorineural  hearing loss of right ear with restricted hearing of left ear 11/08/2022   Last Assessment & Plan: Formatting of this note might be different from the original. Here for medical clearance for the right ear. Noted to have recent worsening of her right ear on audiogram at an outside facility.  Denies any pain.  In general, she is functioning well with her current hearing aid though she is looking at getting a new one.  Left ear with profound loss from prior surgery.  Audio   Murmur 11/01/2013   Normocytic anemia 01/16/2020   Obstructive lung disease (HCC) 07/29/2015   OSA (obstructive sleep apnea) 09/28/2018   Pain in left wrist 06/06/2016   Pedal edema 05/06/2019   Primary osteoarthritis of both feet 01/17/2017   Primary osteoarthritis of both knees 01/17/2017   Primary osteoarthritis, left shoulder 12/11/2017   RECTAL INCONTINENCE 05/02/2008   Qualifier: Diagnosis of   By: Candice Camp CMA (AAMA), Dottie      Replacing diagnoses that were inactivated after the 02/27/23 regulatory import   Retained orthopedic hardware 11/14/2016   Rheumatoid factor positive 12/13/2016   Sarcoidosis    Severe persistent asthma 11/16/2007   Annotation: chronic Qualifier: Diagnosis of  By: Sherene Sires MD, [name]    Shoulder arthritis 02/22/2018   Sleep apnea    no cpap now   Status post dilation of esophageal narrowing    Stiffness of left wrist joint 06/06/2016   Vitamin D deficiency 01/17/2017   Family History  Problem Relation Age of Onset   Esophageal cancer Father    Diabetes Mother    Diabetes Maternal Grandmother    Colon cancer Neg Hx    Rectal cancer Neg Hx    Stomach cancer Neg Hx    Past Surgical History:  Procedure Laterality Date   BLADDER SURGERY     CATARACT EXTRACTION Bilateral    HARDWARE REMOVAL Left 01/12/2017   Procedure: HARDWARE REMOVAL left wrist;  Surgeon: Betha Loa, MD;  Location: [facility];  Service:  Orthopedics;  Laterality: Left;   HEMORRHOID SURGERY      NASAL SEPTUM SURGERY     PARTIAL HYSTERECTOMY     REVERSE SHOULDER ARTHROPLASTY Left 02/22/2018   REVERSE SHOULDER ARTHROPLASTY Left 02/22/2018   Procedure: LEFT REVERSE SHOULDER ARTHROPLASTY;  Surgeon: Cammy Copa, MD;  Location: [facility] OR;  Service: Orthopedics;  Laterality: Left;   TONSILLECTOMY AND ADENOIDECTOMY     TRANSCAROTID ARTERY REVASCULARIZATION  Left 01/17/2020   Procedure: TRANSCAROTID ARTERY REVASCULARIZATION;  Surgeon: Nada Libman, MD;  Location: [facility] OR;  Service: Vascular;  Laterality: Left;   TRIGGER FINGER RELEASE Left 05/12/2016   Procedure: RELEASE TRIGGER FINGER/A-1 PULLEY INFECTION LEFT RING TENDON SHEATH INJECT RIGHT INDEX FINGER;  Surgeon: Betha Loa, MD;  Location: [facility];  Service: Orthopedics;  Laterality: Left;   TUBAL LIGATION     Social History   Occupational History   Occupation: retired  Tobacco Use   Smoking status: Never   Smokeless tobacco: Never  Vaping Use   Vaping status: Never Used  Substance and Sexual Activity   Alcohol use: No   Drug use: No   Sexual activity: Not on file

## 2023-09-19 NOTE — Progress Notes (Signed)
Functional Pain Scale - descriptive words and definitions  Distressing (6)    Pain is present/unable to complete most ADLs limited by pain/sleep is difficult and active distraction is only marginal. Moderate range order  Average Pain 8  Lower back pain on both sides with radiation in left buttocks

## 2023-09-22 ENCOUNTER — Other Ambulatory Visit: Payer: Self-pay | Admitting: Allergy and Immunology

## 2023-09-25 ENCOUNTER — Ambulatory Visit: Payer: Medicare HMO | Admitting: Podiatry

## 2023-09-25 DIAGNOSIS — B351 Tinea unguium: Secondary | ICD-10-CM | POA: Diagnosis not present

## 2023-09-25 DIAGNOSIS — M79675 Pain in left toe(s): Secondary | ICD-10-CM | POA: Diagnosis not present

## 2023-09-25 DIAGNOSIS — M79674 Pain in right toe(s): Secondary | ICD-10-CM

## 2023-09-25 DIAGNOSIS — E119 Type 2 diabetes mellitus without complications: Secondary | ICD-10-CM

## 2023-09-25 NOTE — Progress Notes (Signed)
Subjective:  Patient ID: [name], female    DOB: [birth year],  MRN: [id number]  [facility]  77 y.o. female presents with the above complaint. History confirmed with patient. Patient presenting with pain related to dystrophic thickened elongated nails. Patient is unable to trim own nails related to nail dystrophy and/or mobility issues. Patient does have a history of T2DM without peripheral neuropathy.   Objective:  Physical Exam: warm, good capillary refill, DP and PT pulses 2/4 bilateral nail exam onychomycosis of the toenails, onycholysis, and dystrophic nails DP pulses palpable, PT pulses palpable, and protective sensation intact Left Foot:  Pain with palpation of nails due to elongation and dystrophic growth.  Right Foot: Pain with palpation of nails due to elongation and dystrophic growth.  Assessment:   1. Pain due to onychomycosis of toenails of both feet   2. Diabetes mellitus type II, non insulin dependent (HCC)      Plan:  Patient was evaluated and treated and all questions answered.  #Onychomycosis with pain  -Nails palliatively debrided as below. -Educated on self-care  Procedure: Nail Debridement Rationale: Pain Type of Debridement: manual, sharp debridement. Instrumentation: Nail nipper, rotary burr. Number of Nails: 10  Return in about 3 months (around 12/26/2023) for [facility].         Corinna Gab, DPM [facility]

## 2023-09-26 ENCOUNTER — Encounter: Payer: Self-pay | Admitting: Physical Medicine and Rehabilitation

## 2023-10-02 ENCOUNTER — Ambulatory Visit (INDEPENDENT_AMBULATORY_CARE_PROVIDER_SITE_OTHER): Payer: Medicare HMO | Admitting: *Deleted

## 2023-10-02 DIAGNOSIS — J4550 Severe persistent asthma, uncomplicated: Secondary | ICD-10-CM

## 2023-10-02 DIAGNOSIS — G4733 Obstructive sleep apnea (adult) (pediatric): Secondary | ICD-10-CM | POA: Diagnosis not present

## 2023-10-08 ENCOUNTER — Ambulatory Visit
Admission: RE | Admit: 2023-10-08 | Discharge: 2023-10-08 | Disposition: A | Discharge status: Home / Self Care | Payer: Medicare HMO | Source: Ambulatory Visit | Attending: Physical Medicine and Rehabilitation | Admitting: Physical Medicine and Rehabilitation

## 2023-10-08 DIAGNOSIS — M47816 Spondylosis without myelopathy or radiculopathy, lumbar region: Secondary | ICD-10-CM | POA: Diagnosis not present

## 2023-10-08 DIAGNOSIS — M4186 Other forms of scoliosis, lumbar region: Secondary | ICD-10-CM | POA: Diagnosis not present

## 2023-10-08 DIAGNOSIS — M4316 Spondylolisthesis, lumbar region: Secondary | ICD-10-CM | POA: Diagnosis not present

## 2023-10-08 DIAGNOSIS — M5416 Radiculopathy, lumbar region: Secondary | ICD-10-CM

## 2023-10-08 DIAGNOSIS — G8929 Other chronic pain: Secondary | ICD-10-CM

## 2023-10-16 ENCOUNTER — Other Ambulatory Visit (INDEPENDENT_AMBULATORY_CARE_PROVIDER_SITE_OTHER): Payer: Medicare HMO

## 2023-10-16 ENCOUNTER — Encounter: Payer: Self-pay | Admitting: Internal Medicine

## 2023-10-16 ENCOUNTER — Ambulatory Visit: Payer: Medicare HMO | Admitting: Internal Medicine

## 2023-10-16 VITALS — BP 124/66 | HR 57 | Ht 60.0 in | Wt 141.0 lb

## 2023-10-16 DIAGNOSIS — K219 Gastro-esophageal reflux disease without esophagitis: Secondary | ICD-10-CM

## 2023-10-16 DIAGNOSIS — R194 Change in bowel habit: Secondary | ICD-10-CM

## 2023-10-16 DIAGNOSIS — D649 Anemia, unspecified: Secondary | ICD-10-CM

## 2023-10-16 DIAGNOSIS — K449 Diaphragmatic hernia without obstruction or gangrene: Secondary | ICD-10-CM

## 2023-10-16 DIAGNOSIS — K5900 Constipation, unspecified: Secondary | ICD-10-CM

## 2023-10-16 LAB — CBC
HCT: 36.7 % (ref 36.0–46.0)
Hemoglobin: 12.2 g/dL (ref 12.0–15.0)
MCHC: 33.4 g/dL (ref 30.0–36.0)
MCV: 95.9 fL (ref 78.0–100.0)
Platelets: 236 10*3/uL (ref 150.0–400.0)
RBC: 3.82 Mil/uL — ABNORMAL LOW (ref 3.87–5.11)
RDW: 15.4 % (ref 11.5–15.5)
WBC: 6.7 10*3/uL (ref 4.0–10.5)

## 2023-10-16 LAB — TSH: TSH: 0.73 u[IU]/mL (ref 0.35–5.50)

## 2023-10-16 LAB — IBC + FERRITIN
Ferritin: 21.7 ng/mL (ref 10.0–291.0)
Iron: 47 ug/dL (ref 42–145)
Saturation Ratios: 15 % — ABNORMAL LOW (ref 20.0–50.0)
TIBC: 313.6 ug/dL (ref 250.0–450.0)
Transferrin: 224 mg/dL (ref 212.0–360.0)

## 2023-10-16 LAB — VITAMIN B12: Vitamin B-12: 365 pg/mL (ref 211–911)

## 2023-10-16 LAB — FOLATE: Folate: 8.7 ng/mL (ref >5.9)

## 2023-10-16 MED ORDER — NA SULFATE-K SULFATE-MG SULF 17.5-3.13-1.6 GM/177ML PO SOLN: ORAL | 0 refills | Status: DC

## 2023-10-16 NOTE — Patient Instructions (Addendum)
Your provider has requested that you go to the basement level for lab work before leaving today. Press "B" on the elevator. The lab is located at the first door on the left as you exit the elevator.  You have been scheduled for an endoscopy and colonoscopy. Please follow the written instructions given to you at your visit today.  Please pick up your prep supplies at the pharmacy within the next 1-3 days.  If you use inhalers (even only as needed), please bring them with you on the day of your procedure.  DO NOT TAKE 7 DAYS PRIOR TO TEST- Trulicity (dulaglutide) Ozempic, Wegovy (semaglutide) Mounjaro (tirzepatide) Bydureon Bcise (exanatide extended release)  DO NOT TAKE 1 DAY PRIOR TO YOUR TEST Rybelsus (semaglutide) Adlyxin (lixisenatide) Victoza (liraglutide) Byetta (exanatide) ___________________________________________________________________________  We have sent the following medications to your pharmacy for you to pick up at your convenience: Suprep  Drink 8 cups of water a day and walk 30 minutes a day.  Please purchase the following medications over the counter and take as directed: Fiber supplement such as Benefiber- use as directed daily  Miralax: Take as  directed up to 3 times a day to achieve regular bowel movements  If your blood pressure at your visit was 140/90 or greater, please contact your primary care physician to follow up on this.  _______________________________________________________  If you are age 47 or older, your body mass index should be between 23-30. Your Body mass index is 27.54 kg/m. If this is out of the aforementioned range listed, please consider follow up with your Primary Care Provider.  If you are age 49 or younger, your body mass index should be between 19-25. Your Body mass index is 27.54 kg/m. If this is out of the aformentioned range listed, please consider follow up with your Primary Care Provider.    ________________________________________________________  The [facility] providers would like to encourage you to use [facility] to communicate with providers for non-urgent requests or questions.  Due to long hold times on the telephone, sending your provider a message by [facility] may be a faster and more efficient way to get a response.  Please allow 48 business hours for a response.  Please remember that this is for non-urgent requests.  _______________________________________________________   Due to recent changes in healthcare laws, you may see the results of your imaging and laboratory studies on MyChart before your provider has had a chance to review them.  We understand that in some cases there may be results that are confusing or concerning to you. Not all laboratory results come back in the same time frame and the provider may be waiting for multiple results in order to interpret others.  Please give [place] 48 hours in order for your provider to thoroughly review all the results before contacting the office for clarification of your results.    Thank you for entrusting me with your care and for choosing [facility], Dr. Eulah Pont

## 2023-10-16 NOTE — Progress Notes (Signed)
Chief Complaint: Constipation  HPI : 77 year old female with history of asthma, osteoporosis, arthritis, DDD, DM, GERD, hearing loss, esophageal stricture s/p dilation, and OSA presents with constipation  In 2008, she had surgery on her bladder, and she has had difficulty with passing a stool ever since that surgery. When she pees, she will have little balls that come out of her rectum at the same time. Last week she really got clogged up and her stomach was hurting really badly. Sometimes she will have a good BM, but other times she will only have several little balls of stool that come out. She takes stool softeners and Miralax. After she uses the Miralax for so many days, she will have diarrhea. Her last use of Miralax was over a month ago. Denies hematochezia or melena. Stools are dark appearing but she does not think that hey are black. She has been told that she doesn't drink enough water, but then she will have to pee too much if she drinks too much. Sometimes she will have ab pain if she gets too constipated. She goes several days without having a BM. She went [facility] in 06/2022 after she vomited up blood and was told that she had a tear in her esophagus. She did have dysphagia in the past. Dr. Juanda Chance stretched her esophagus several times in the past. Denies any dysphagia currently. She takes Prilosec twice daily for GERD. Denies current N&V. She has lost weight with 20 lbs over time that is not deliberate, which she mostly attributes to her hospitalizations last year.  Denies family history of colon cancer.  She does have family history of colon polyps.  Wt Readings from Last 3 Encounters:  10/16/23 141 lb (64 kg)  05/23/23 143 lb 3.2 oz (65 kg)  05/22/23 144 lb (65.3 kg)   Past Medical History:  Diagnosis Date   ABDOMINAL PAIN RIGHT LOWER QUADRANT 03/03/2010   Qualifier: Diagnosis of  By: Juanda Chance MD, Hedwig Morton    Acquired trigger finger 04/20/2016   Age-related osteoporosis  without current pathological fracture 01/23/2017   Allergy    Anxiety    Arthritis    Asthma    Bilateral carotid artery stenosis 01/16/2020   Bilateral primary osteoarthritis of knee 10/16/2018   Cataract    bilateral and removed   Change in bowel function 11/07/2022   Chest pain 11/01/2013   Closed fracture of distal end of radius 06/05/2016   Closed fracture of nasal bones 04/26/2023   Last Assessment & Plan: Formatting of this note might be different from the original. Nasal trauma. See history of present illness.  Denies nasal obstruction or concern over the appearance of the nose.  CT is reviewed and shows minimally displaced right nasal bone fracture. EXAM shows nasal dorsum in the midline.  Still little edema of the nasal dorsum.  Intranasal exam shows midline nasal septum.   DDD (degenerative disc disease), lumbar 01/23/2017   DECREASED HEARING 05/02/2008   Qualifier: Diagnosis of   By: Nelson-Smith CMA (AAMA), Dottie      Replacing diagnoses that were inactivated after the 02/27/23 regulatory import   Depression    Diabetes mellitus type II, non insulin dependent (HCC) 07/05/2019   DM 03/03/2010   Qualifier: Diagnosis of  By: Katrinka Blazing CMA, Tresa Endo     DM (diabetes mellitus) (HCC)    DOE (dyspnea on exertion) 05/06/2019   Dyslipidemia 01/23/2017   Dyspnea    Esophageal reflux    ESOPHAGITIS 05/02/2008  Qualifier: Diagnosis of   By: Nelson-Smith CMA (AAMA), Dottie      Replacing diagnoses that were inactivated after the 02/27/23 regulatory import   Essential hypertension 05/02/2008   Qualifier: Diagnosis of  By: Nelson-Smith CMA (AAMA), Dottie     Full incontinence of feces 03/03/2010   Qualifier: Diagnosis of  By: Juanda Chance MD, Hedwig Morton    H/O adenomatous polyp of colon 2011   H/O allergic rhinitis 07/29/2015   Hearing loss, mixed, bilateral 12/17/2018   Heart murmur    no cardiologist, family Dr. Zollie Scale   HIATAL HERNIA 05/02/2008   Qualifier: Diagnosis of   By: Candice Camp CMA  (AAMA), Dottie      Replacing diagnoses that were inactivated after the 02/27/23 regulatory import   History of asthma 01/23/2017   History of depression 01/23/2017   History of diabetes mellitus 01/23/2017   History of gastroesophageal reflux (GERD) 01/17/2017   History of kidney stones    History of transient ischemic attack (TIA) 01/16/2020   Hyperlipidemia    Irritable bowel syndrome    Kidney stones    LPRD (laryngopharyngeal reflux disease) 07/29/2015   Mallory-Weiss tear 07/03/2022   Mild aortic stenosis 07/05/2019   Mitral regurgitation 07/05/2019   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 Here for medical clearance for the right ear. Noted to have recent worsening of her right ear on audiogram at an outside facility.  Denies any pain.  In general, she is functioning well with her current hearing aid though she is looking at getting a new one.  Left ear with profound loss from prior surgery.  Audio   Murmur 11/01/2013   Normocytic anemia 01/16/2020   Obstructive lung disease (HCC) 07/29/2015   OSA (obstructive sleep apnea) 09/28/2018   Pain in left wrist 06/06/2016   Pedal edema 05/06/2019   Primary osteoarthritis of both feet 01/17/2017   Primary osteoarthritis of both knees 01/17/2017   Primary osteoarthritis, left shoulder 12/11/2017   RECTAL INCONTINENCE 05/02/2008   Qualifier: Diagnosis of   By: Candice Camp CMA (AAMA), Dottie      Replacing diagnoses that were inactivated after the 02/27/23 regulatory import   Retained orthopedic hardware 11/14/2016   Rheumatoid factor positive 12/13/2016   Sarcoidosis    Severe persistent asthma 11/16/2007   Annotation: chronic Qualifier: Diagnosis of  By: Sherene Sires MD, [name]    Shoulder arthritis 02/22/2018   Sleep apnea    no cpap now   Status post dilation of esophageal narrowing     Stiffness of left wrist joint 06/06/2016   Stroke (HCC)    Vitamin D deficiency 01/17/2017     Past Surgical History:  Procedure Laterality Date   BLADDER SURGERY     CATARACT EXTRACTION Bilateral    HARDWARE REMOVAL Left 01/12/2017   Procedure: HARDWARE REMOVAL left wrist;  Surgeon: Betha Loa, MD;  Location: [facility];  Service: Orthopedics;  Laterality: Left;   HEMORRHOID SURGERY     NASAL SEPTUM SURGERY     PARTIAL HYSTERECTOMY     REVERSE SHOULDER ARTHROPLASTY Left 02/22/2018   REVERSE SHOULDER ARTHROPLASTY Left 02/22/2018   Procedure: LEFT REVERSE SHOULDER ARTHROPLASTY;  Surgeon: Cammy Copa, MD;  Location: [facility] OR;  Service: Orthopedics;  Laterality: Left;   TONSILLECTOMY AND ADENOIDECTOMY     TRANSCAROTID ARTERY REVASCULARIZATION  Left 01/17/2020   Procedure: TRANSCAROTID  ARTERY REVASCULARIZATION;  Surgeon: Nada Libman, MD;  Location: [facility] OR;  Service: Vascular;  Laterality: Left;   TRIGGER FINGER RELEASE Left 05/12/2016   Procedure: RELEASE TRIGGER FINGER/A-1 PULLEY INFECTION LEFT RING TENDON SHEATH INJECT RIGHT INDEX FINGER;  Surgeon: Betha Loa, MD;  Location: [facility];  Service: Orthopedics;  Laterality: Left;   TUBAL LIGATION     Family History  Problem Relation Age of Onset   Esophageal cancer Father    Diabetes Mother    Diabetes Maternal Grandmother    Colon cancer Neg Hx    Rectal cancer Neg Hx    Stomach cancer Neg Hx    Social History   Tobacco Use   Smoking status: Never   Smokeless tobacco: Never  Vaping Use   Vaping status: Never Used  Substance Use Topics   Alcohol use: No   Drug use: No   Current Outpatient Medications  Medication Sig Dispense Refill   acetaminophen (TYLENOL) 325 MG tablet Take 2 tablets (650 mg total) by mouth every 6 (six) hours as needed for mild pain (or Fever >/= 101).     albuterol (VENTOLIN HFA) 108 (90 Base) MCG/ACT inhaler Inhale 2 puffs into the lungs every 6 (six) hours as  needed for wheezing or shortness of breath.     alendronate (FOSAMAX) 70 MG tablet Take 70 mg by mouth once a week.     aspirin 81 MG EC tablet Take 1 tablet by mouth daily.     atorvastatin (LIPITOR) 40 MG tablet Take 40 mg by mouth at bedtime.     citalopram (CELEXA) 20 MG tablet Take 20 mg by mouth daily.     diclofenac (VOLTAREN) 25 MG EC tablet Take 25 mg by mouth daily.     dicyclomine (BENTYL) 20 MG tablet Take 20 mg by mouth as needed for spasms.     EPINEPHrine 0.3 mg/0.3 mL IJ SOAJ injection Use as directed for life-threatening allergic reaction. 1 each 3   famotidine (PEPCID) 40 MG tablet TAKE 1 TABLET BY MOUTH EVERY DAY AT BEDTIME 30 tablet 5   Ferrous Sulfate (IRON PO) Take 1 tablet by mouth daily.     fluticasone (FLONASE) 50 MCG/ACT nasal spray Place 1-2 sprays into both nostrils as needed for allergies or rhinitis.     fluticasone-salmeterol (ADVAIR) 250-50 MCG/ACT AEPB Inhale one puff twice daily to prevent cough or wheeze. Rinse mouth after use. 60 each 4   gabapentin (NEURONTIN) 100 MG capsule Take 100 mg by mouth as needed (arthritis pain).     ipratropium (ATROVENT) 0.06 % nasal spray INSTILL 1-2 SPRAYS IN EACH NOSTRIL EVERY 6 HOURS IF needed 15 mL 5   ipratropium-albuterol (DUONEB) 0.5-2.5 (3) MG/3ML SOLN Take 3 mLs by nebulization every 6 (six) hours as needed for wheezing or shortness of breath (shortness of breath/wheezing).     levocetirizine (XYZAL) 5 MG tablet Take 5 mg by mouth at bedtime.      metoprolol succinate (TOPROL-XL) 25 MG 24 hr tablet Take 25 mg by mouth at bedtime.     montelukast (SINGULAIR) 10 MG tablet Take 10 mg by mouth daily.     omeprazole (PRILOSEC) 40 MG capsule Take 40 mg by mouth 2 (two) times daily.     Probiotic Product (PROBIOTIC DAILY PO) Take 1 capsule by mouth daily.      Semaglutide,0.25 or 0.5MG /DOS, (OZEMPIC, 0.25 OR 0.5 MG/DOSE,) 2 MG/1.5ML SOPN Inject 0.25 mg into the skin every 7 (seven) days.  tamsulosin (FLOMAX) 0.4 MG CAPS  capsule Take 0.4 mg by mouth daily.     Telmisartan-amLODIPine 80-5 MG TABS Take 1 tablet by mouth daily.     Tiotropium Bromide Monohydrate (SPIRIVA RESPIMAT) 2.5 MCG/ACT AERS Inhale 2 puffs into the lungs daily. 4 g 5   umeclidinium bromide (INCRUSE ELLIPTA) 62.5 MCG/ACT AEPB Inhale 1 puff into the lungs daily. 30 each 4   Current Facility-Administered Medications  Medication Dose Route Frequency Provider Last Rate Last Admin   tezepelumab-ekko (TEZSPIRE) 210 MG/1. syringe 210 mg  210 mg Subcutaneous Q28 days Jessica Priest, MD   210 mg at 10/02/23 1423   Allergies  Allergen Reactions   Codeine Nausea And Vomiting   Sulfa Antibiotics Other (See Comments)    Allergic per pt's mother      Review of Systems: All systems reviewed and negative except where noted in HPI.   Physical Exam: BP 124/66   Pulse (!) 57   Ht 5' (1.524 m)   Wt 141 lb (64 kg)   SpO2 98%   BMI 27.54 kg/m  Constitutional: Pleasant,well-developed, female in no acute distress. HEENT: Normocephalic and atraumatic. Conjunctivae are normal. No scleral icterus. Cardiovascular: Normal rate, regular rhythm.  Pulmonary/chest: Effort normal and breath sounds normal. No wheezing, rales or rhonchi. Abdominal: Soft, nondistended, nontender. Bowel sounds active throughout. There are no masses palpable. No hepatomegaly. Extremities: No edema Neurological: Alert and oriented to person place and time. Skin: Skin is warm and dry. No rashes noted. Psychiatric: Normal mood and affect. Behavior is normal.  Labs 10/2022: CBC with low Hb of 11.4 and low WBC of 3.8.  Labs 04/2023: CBC with low Hb of 11.6 and MCV nml at 91. CMP nml.   CT C/A/P w/contrast 10/16/20:   EGD (06/2022) = MW tear at [facility] w/ evidence of recent bleeding (s/p Hemoclip placement), small HH, Gastritis, and normal duodenum. Colonoscopy 06/13/2015: Cecal sessile polyp. Mild diverticulosis noted in the left colon. Biopsies taken of the colon to rule out  microscopic colitis Path: Polypoid mucosa. Minimal active inflammation. Repeat colonoscopy recommended in 10 years. Colonoscopy (03/2010 [facility]) = two 75mm/4mm Adenomatous polyps were removed from the Cecum o/w normal exam EGD (04/2008 [place]) = esophageal stricture (s/p dilation), a 2cm HH o/w normal stomach, and normal duodenum.   ASSESSMENT AND PLAN: Change in bowel habits, worsening constipation History of Mallory-Weiss tear Hiatal hernia GERD Anemia Constipation Hard of hearing Patient presents with constipation that has worsened over time.  She has difficulty passing BMs and can have exacerbations and abdominal pain if she gets too constipated.  She currently is using MiraLAX as needed, though does not appear to take this very consistently.  I went over conservative strategies to help with constipation and asked her to take her MiraLAX more consistently.  Will also plan for a colonoscopy in order to rule out colon cancer.  Because the patient has also recently been noted to have anemia, we will recheck her blood counts today and evaluate for sources of anemia.  Will also check her thyroid level to make sure this is not contributing to her constipation issues.  If the above is not effective could consider sending the patient to pelvic floor physical therapy. - Check CBC, ferritin/IBC, vitamin B12, folate, TSH - Drink 8 cups of water, walk 30 min, daily fiber supplement - Start Miralax daily - Colonoscopy [facility] with 2 day prep - Consider pelvic floor PT  Eulah Pont, MD  I spent 64 minutes of time,  including in depth chart review, independent review of results as outlined above, communicating results with the patient directly, face-to-face time with the patient, coordinating care, ordering studies and medications as appropriate, and documentation.

## 2023-10-23 ENCOUNTER — Other Ambulatory Visit: Payer: Self-pay | Admitting: *Deleted

## 2023-10-23 MED ORDER — TEZSPIRE 210 MG/1.91ML ~~LOC~~ SOAJ: 210.0000 mg | SUBCUTANEOUS | 11 refills | Status: DC

## 2023-10-30 ENCOUNTER — Ambulatory Visit: Payer: Medicare HMO

## 2023-10-31 ENCOUNTER — Ambulatory Visit (INDEPENDENT_AMBULATORY_CARE_PROVIDER_SITE_OTHER): Payer: Medicare HMO | Admitting: Physical Medicine and Rehabilitation

## 2023-10-31 ENCOUNTER — Ambulatory Visit: Payer: Medicare HMO | Admitting: Physical Medicine and Rehabilitation

## 2023-10-31 ENCOUNTER — Encounter: Payer: Self-pay | Admitting: Physical Medicine and Rehabilitation

## 2023-10-31 DIAGNOSIS — M5442 Lumbago with sciatica, left side: Secondary | ICD-10-CM

## 2023-10-31 DIAGNOSIS — G8929 Other chronic pain: Secondary | ICD-10-CM | POA: Diagnosis not present

## 2023-10-31 DIAGNOSIS — M5416 Radiculopathy, lumbar region: Secondary | ICD-10-CM | POA: Diagnosis not present

## 2023-10-31 DIAGNOSIS — M47816 Spondylosis without myelopathy or radiculopathy, lumbar region: Secondary | ICD-10-CM | POA: Diagnosis not present

## 2023-10-31 NOTE — Progress Notes (Deleted)
[name] - 77 y.o. female MRN [id number]  Date of birth: 1946/08/22  Office Visit Note: Visit Date: 10/31/2023 PCP: Buckner Malta, MD Referred by: Buckner Malta, MD  Subjective: Chief Complaint  Patient presents with   Lower Back - Follow-up   HPI: [name] is a 77 y.o. female who comes in today HPI   ROS Otherwise per HPI.  Assessment & Plan: Visit Diagnoses:    ICD-10-CM   1. Lumbar radiculopathy  M54.16        Plan: No additional findings.   Meds & Orders: No orders of the defined types were placed in this encounter.  No orders of the defined types were placed in this encounter.   Follow-up: No follow-ups on file.   Procedures: No procedures performed      Clinical History: CLINICAL DATA:  77 year old female with low back pain radiating down the leg.   EXAM: MRI LUMBAR SPINE WITHOUT CONTRAST   TECHNIQUE: Multiplanar, multisequence MR imaging of the lumbar spine was performed. No intravenous contrast was administered.   COMPARISON:  Lumbar radiographs 02/08/2023.  Lumbar MRI 04/09/2020.   FINDINGS: Segmentation: Normal on the comparison radiographs, the same numbering system used on the previous MRI.   Alignment: Chronic straightening of lumbar lordosis appears stable since 2021. Mild dextroconvex upper and levoconvex mid to lower lumbar scoliosis as noted on the comparison radiographs (series 14 today). There is subtle degenerative anterolisthesis of L3 on L4. See additional details of that level below.   Vertebrae: Normal background bone marrow signal and maintained vertebral height. Degenerative endplate marrow signal changes at L3-L4 including mild vertebral marrow edema eccentric to the right (series 107, image 10).   No other marrow edema or acute osseous abnormality. Intact visible sacrum and SI joints.   Conus medullaris and cauda equina: Conus extends to the L1-L2 level. No lower spinal cord or conus signal  abnormality. Generally normal cauda equina nerve roots.   Paraspinal and other soft tissues: Negative.   Disc levels:   Visible lower thoracic levels are normal for age through T12-L1.   L1-L2: Mild disc bulging asymmetric to the left. Borderline to mild left facet hypertrophy. No stenosis.   L2-L3: Similar mild asymmetric disc bulging to the left. Mild left facet and mild ligament flavum hypertrophy. No stenosis.   L3-L4: Subtle anterolisthesis. Disc space loss since 2021. Asymmetric disc bulging, bulky on the right side. Progressed and up to moderate right side facet and ligament flavum hypertrophy. No significant spinal stenosis. Borderline to mild right lateral recess stenosis (right L4 nerve level). And moderate progressed right L3 neural foraminal stenosis (series 105, image 7).   L4-L5: Subtle anterolisthesis also at this level. But only minor disc bulging. Mild facet and ligament flavum hypertrophy. No stenosis.   L5-S1:  Negative.   IMPRESSION: 1. Mild lumbar scoliosis. Progressed L3-L4 disc, endplate, and posterior element degeneration since 2021 with subtle anterolisthesis there. Increased and moderate right neural foraminal stenosis, mild right lateral recess stenosis. Query Right L3 and/or L4 radiculitis. 2. Otherwise mild for age lumbar spine degeneration. No spinal stenosis or other neural impingement.     Electronically Signed   By: Odessa Fleming M.D.   On: 10/30/2023 11:23   She reports that she has never smoked. She has never used smokeless tobacco. No results for input(s): "HGBA1C", "LABURIC" in the last 8760 hours.  Objective:  VS:  HT:    WT:   BMI:     BP:   HR:  bpm  TEMP: ( )  RESP:  Physical Exam  Ortho Exam  Imaging: No results found.  Past Medical/Family/Surgical/Social History: Medications & Allergies reviewed per EMR, new medications updated. Patient Active Problem List   Diagnosis Date Noted   Closed fracture of nasal bones  04/26/2023   Mixed conductive and sensorineural hearing loss of right ear with restricted hearing of left ear 11/08/2022   Change in bowel function 11/07/2022   Mallory-Weiss tear 07/03/2022   Allergy    Anxiety    Arthritis    Cataract    Depression    DM (diabetes mellitus) (HCC)    Dyspnea    Heart murmur    History of kidney stones    Hyperlipidemia    Kidney stones    Sleep apnea    Status post dilation of esophageal narrowing    Asthma 01/16/2020   History of transient ischemic attack (TIA) 01/16/2020   Bilateral carotid artery stenosis 01/16/2020   Normocytic anemia 01/16/2020   Diabetes mellitus type II, non insulin dependent (HCC) 07/05/2019   Mild aortic stenosis 07/05/2019   Mitral regurgitation 07/05/2019   Pedal edema 05/06/2019   DOE (dyspnea on exertion) 05/06/2019   Hearing loss, mixed, bilateral 12/17/2018   Bilateral primary osteoarthritis of knee 10/16/2018   OSA (obstructive sleep apnea) 09/28/2018   Shoulder arthritis 02/22/2018   Primary osteoarthritis, left shoulder 12/11/2017   DDD (degenerative disc disease), lumbar 01/23/2017   History of diabetes mellitus 01/23/2017   History of depression 01/23/2017   History of asthma 01/23/2017   Dyslipidemia 01/23/2017   Age-related osteoporosis without current pathological fracture 01/23/2017   History of gastroesophageal reflux (GERD) 01/17/2017   Primary osteoarthritis of both knees 01/17/2017   Primary osteoarthritis of both feet 01/17/2017   Vitamin D deficiency 01/17/2017   Rheumatoid factor positive 12/13/2016   Retained orthopedic hardware 11/14/2016   Stiffness of left wrist joint 06/06/2016   Closed fracture of distal end of radius 06/05/2016   Acquired trigger finger 04/20/2016   Obstructive lung disease (HCC) 07/29/2015   H/O allergic rhinitis 07/29/2015   LPRD (laryngopharyngeal reflux disease) 07/29/2015   Chest pain 11/01/2013   Murmur 11/01/2013   DM 03/03/2010   ABDOMINAL PAIN RIGHT  LOWER QUADRANT 03/03/2010   FULL INCONTINENCE OF FECES 03/03/2010   H/O adenomatous polyp of colon 2011   DECREASED HEARING 05/02/2008   Essential hypertension 05/02/2008   ESOPHAGITIS 05/02/2008   HIATAL HERNIA 05/02/2008   RECTAL INCONTINENCE 05/02/2008   SARCOIDOSIS 11/16/2007   Severe persistent asthma 11/16/2007   GERD 11/16/2007   IRRITABLE BOWEL SYNDROME 11/16/2007   Past Medical History:  Diagnosis Date   ABDOMINAL PAIN RIGHT LOWER QUADRANT 03/03/2010   Qualifier: Diagnosis of  By: Juanda Chance MD, Hedwig Morton    Acquired trigger finger 04/20/2016   Age-related osteoporosis without current pathological fracture 01/23/2017   Allergy    Anxiety    Arthritis    Asthma    Bilateral carotid artery stenosis 01/16/2020   Bilateral primary osteoarthritis of knee 10/16/2018   Cataract    bilateral and removed   Change in bowel function 11/07/2022   Chest pain 11/01/2013   Closed fracture of distal end of radius 06/05/2016   Closed fracture of nasal bones 04/26/2023   Last Assessment & Plan: Formatting of this note might be different from the original. Nasal trauma. See history of present illness.  Denies nasal obstruction or concern over the appearance of the nose.  CT is reviewed and shows minimally  displaced right nasal bone fracture. EXAM shows nasal dorsum in the midline.  Still little edema of the nasal dorsum.  Intranasal exam shows midline nasal septum.   DDD (degenerative disc disease), lumbar 01/23/2017   DECREASED HEARING 05/02/2008   Qualifier: Diagnosis of   By: Candice Camp CMA (AAMA), Dottie      Replacing diagnoses that were inactivated after the 02/27/23 regulatory import   Depression    Diabetes mellitus type II, non insulin dependent (HCC) 07/05/2019   DM 03/03/2010   Qualifier: Diagnosis of  By: Katrinka Blazing CMA, Tresa Endo     DM (diabetes mellitus) (HCC)    DOE (dyspnea on exertion) 05/06/2019   Dyslipidemia 01/23/2017   Dyspnea    Esophageal reflux    ESOPHAGITIS 05/02/2008    Qualifier: Diagnosis of   By: Candice Camp CMA (AAMA), Dottie      Replacing diagnoses that were inactivated after the 02/27/23 regulatory import   Essential hypertension 05/02/2008   Qualifier: Diagnosis of  By: Nelson-Smith CMA (AAMA), Dottie     Full incontinence of feces 03/03/2010   Qualifier: Diagnosis of  By: Juanda Chance MD, Hedwig Morton    H/O adenomatous polyp of colon 2011   H/O allergic rhinitis 07/29/2015   Hearing loss, mixed, bilateral 12/17/2018   Heart murmur    no cardiologist, family Dr. Zollie Scale   HIATAL HERNIA 05/02/2008   Qualifier: Diagnosis of   By: Candice Camp CMA (AAMA), Dottie      Replacing diagnoses that were inactivated after the 02/27/23 regulatory import   History of asthma 01/23/2017   History of depression 01/23/2017   History of diabetes mellitus 01/23/2017   History of gastroesophageal reflux (GERD) 01/17/2017   History of kidney stones    History of transient ischemic attack (TIA) 01/16/2020   Hyperlipidemia    Irritable bowel syndrome    Kidney stones    LPRD (laryngopharyngeal reflux disease) 07/29/2015   Mallory-Weiss tear 07/03/2022   Mild aortic stenosis 07/05/2019   Mitral regurgitation 07/05/2019   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 Here for medical clearance for the right ear. Noted to have recent worsening of her right ear on audiogram at an outside facility.  Denies any pain.  In general, she is functioning well with her current hearing aid though she is looking at getting a new one.  Left ear with profound loss from prior surgery.  Audio   Murmur 11/01/2013   Normocytic anemia 01/16/2020   Obstructive lung disease (HCC) 07/29/2015   OSA (obstructive sleep apnea) 09/28/2018   Pain in left wrist 06/06/2016   Pedal edema 05/06/2019   Primary osteoarthritis of both feet 01/17/2017   Primary  osteoarthritis of both knees 01/17/2017   Primary osteoarthritis, left shoulder 12/11/2017   RECTAL INCONTINENCE 05/02/2008   Qualifier: Diagnosis of   By: Nelson-Smith CMA (AAMA), Dottie      Replacing diagnoses that were inactivated after the 02/27/23 regulatory import   Retained orthopedic hardware 11/14/2016   Rheumatoid factor positive 12/13/2016   Sarcoidosis    Severe persistent asthma 11/16/2007   Annotation: chronic Qualifier: Diagnosis of  By: Sherene Sires MD, [name]    Shoulder arthritis 02/22/2018   Sleep apnea    no cpap now   Status post dilation of esophageal narrowing    Stiffness of left wrist joint 06/06/2016   Stroke (HCC)    Vitamin  D deficiency 01/17/2017   Family History  Problem Relation Age of Onset   Esophageal cancer Father    Diabetes Mother    Diabetes Maternal Grandmother    Colon cancer Neg Hx    Rectal cancer Neg Hx    Stomach cancer Neg Hx    Past Surgical History:  Procedure Laterality Date   BLADDER SURGERY     CATARACT EXTRACTION Bilateral    HARDWARE REMOVAL Left 01/12/2017   Procedure: HARDWARE REMOVAL left wrist;  Surgeon: Betha Loa, MD;  Location: [facility];  Service: Orthopedics;  Laterality: Left;   HEMORRHOID SURGERY     NASAL SEPTUM SURGERY     PARTIAL HYSTERECTOMY     REVERSE SHOULDER ARTHROPLASTY Left 02/22/2018   REVERSE SHOULDER ARTHROPLASTY Left 02/22/2018   Procedure: LEFT REVERSE SHOULDER ARTHROPLASTY;  Surgeon: Cammy Copa, MD;  Location: [facility] OR;  Service: Orthopedics;  Laterality: Left;   TONSILLECTOMY AND ADENOIDECTOMY     TRANSCAROTID ARTERY REVASCULARIZATION  Left 01/17/2020   Procedure: TRANSCAROTID ARTERY REVASCULARIZATION;  Surgeon: Nada Libman, MD;  Location: [facility] OR;  Service: Vascular;  Laterality: Left;   TRIGGER FINGER RELEASE Left 05/12/2016   Procedure: RELEASE TRIGGER FINGER/A-1 PULLEY INFECTION LEFT RING TENDON SHEATH INJECT RIGHT INDEX FINGER;  Surgeon: Betha Loa, MD;  Location: [facility]  [place];  Service: Orthopedics;  Laterality: Left;   TUBAL LIGATION     Social History   Occupational History   Occupation: retired  Tobacco Use   Smoking status: Never   Smokeless tobacco: Never  Vaping Use   Vaping status: Never Used  Substance and Sexual Activity   Alcohol use: No   Drug use: No   Sexual activity: Not on file

## 2023-10-31 NOTE — Progress Notes (Signed)
[name] - 77 y.o. female MRN [id number]  Date of birth: [birth year]  Office Visit Note: Visit Date: 10/31/2023 PCP: Buckner Malta, MD Referred by: Buckner Malta, MD  Subjective: No chief complaint on file.  HPI: [name] is a 77 y.o. female who comes in today for evaluation of chronic, worsening and severe bilateral lower back pain radiating to left groin and down left leg. Her pain is significantly different from our previous office visit. There was a mix up with her appointment today, she was placed on incorrect schedule, however we were able to work her in this morning. Her pain is now more severe on the left side and reports pain to entire left leg. Pain ongoing for several years, worsens with standing and walking. She describes her pain as sore and burning sensation, currently rates as 7 out of 10. Some relief of pain with home exercise regimen, rest and use of medications. Recent lumbar MRI imaging exhibits mild lumbar scoliosis, moderate right side facet and ligament flavum hypertrophy at L3-L4, there is also moderate progressed right sided L3 foraminal stenosis. No high grade spinal canal stenosis noted. There is osteoarthritis noted to hips bilaterally, left greater than right. She did undergo left intra-articular hip injection with Dr. Alvester Morin in 2021 that provided some relief of pain. She is managed by Dr. Dorene Grebe from orthopedic standpoint. She reports history of multiple falls, no falls recently. She does have history of osteoarthritis and osteoporosis.  She denies focal weakness, numbness and tingling.         Review of Systems  Musculoskeletal:  Positive for back pain.  Neurological:  Negative for tingling, sensory change, focal weakness and weakness.  All other systems reviewed and are negative.  Otherwise per HPI.  Assessment & Plan: Visit Diagnoses:    ICD-10-CM   1. Chronic bilateral low back pain with left-sided sciatica  M54.42 Ambulatory  referral to Physical Medicine Rehab   G89.29     2. Lumbar radiculopathy  M54.16 Ambulatory referral to Physical Medicine Rehab    3. Facet arthropathy, lumbar  M47.816 Ambulatory referral to Physical Medicine Rehab       Plan: Findings:  Chronic, worsening and severe bilateral lower back pain radiating to left groin and down left leg. Patient continues to have severe pain despite good conservative therapies such as home exercise regimen, rest and use of medications. I discussed recent lumbar MRI today using imaging and spine model. Patients clinical presentation and exam are complex, her symptoms do not directly correlate with recent lumbar MRI imaging. Her pain pattern is more non dermatomal. Her pain could  be consistent with intrinsic left hip issue. She does have left groin pain with internal rotation. We discussed treatment plan in detail today, next step is to perform diagnostic and hopefully therapeutic left L4-L5 interlaminar epidural steroid injection under fluoroscopic guidance. If good relief of pain we can repeat this injection infrequency as needed. If we feel her pain is more consistent with left hip issue would refer to Dr. August Saucer for evaluation, could look at repeating left intra-articular hip injection. Dr. Alvester Morin at bedside to discuss injection procedure, she has no questions at this time. No red flag symptoms noted upon exam today.     Meds & Orders: No orders of the defined types were placed in this encounter.   Orders Placed This Encounter  Procedures   Ambulatory referral to Physical Medicine Rehab    Follow-up: Return for Left L4-L5 interlaminar epidural steroid  injection.   Procedures: No procedures performed      Clinical History: CLINICAL DATA:  77 year old female with low back pain radiating down the leg.   EXAM: MRI LUMBAR SPINE WITHOUT CONTRAST   TECHNIQUE: Multiplanar, multisequence MR imaging of the lumbar spine was performed. No intravenous contrast was  administered.   COMPARISON:  Lumbar radiographs 02/08/2023.  Lumbar MRI 04/09/2020.   FINDINGS: Segmentation: Normal on the comparison radiographs, the same numbering system used on the previous MRI.   Alignment: Chronic straightening of lumbar lordosis appears stable since 2021. Mild dextroconvex upper and levoconvex mid to lower lumbar scoliosis as noted on the comparison radiographs (series 14 today). There is subtle degenerative anterolisthesis of L3 on L4. See additional details of that level below.   Vertebrae: Normal background bone marrow signal and maintained vertebral height. Degenerative endplate marrow signal changes at L3-L4 including mild vertebral marrow edema eccentric to the right (series 107, image 10).   No other marrow edema or acute osseous abnormality. Intact visible sacrum and SI joints.   Conus medullaris and cauda equina: Conus extends to the L1-L2 level. No lower spinal cord or conus signal abnormality. Generally normal cauda equina nerve roots.   Paraspinal and other soft tissues: Negative.   Disc levels:   Visible lower thoracic levels are normal for age through T12-L1.   L1-L2: Mild disc bulging asymmetric to the left. Borderline to mild left facet hypertrophy. No stenosis.   L2-L3: Similar mild asymmetric disc bulging to the left. Mild left facet and mild ligament flavum hypertrophy. No stenosis.   L3-L4: Subtle anterolisthesis. Disc space loss since 2021. Asymmetric disc bulging, bulky on the right side. Progressed and up to moderate right side facet and ligament flavum hypertrophy. No significant spinal stenosis. Borderline to mild right lateral recess stenosis (right L4 nerve level). And moderate progressed right L3 neural foraminal stenosis (series 105, image 7).   L4-L5: Subtle anterolisthesis also at this level. But only minor disc bulging. Mild facet and ligament flavum hypertrophy. No stenosis.   L5-S1:  Negative.    IMPRESSION: 1. Mild lumbar scoliosis. Progressed L3-L4 disc, endplate, and posterior element degeneration since 2021 with subtle anterolisthesis there. Increased and moderate right neural foraminal stenosis, mild right lateral recess stenosis. Query Right L3 and/or L4 radiculitis. 2. Otherwise mild for age lumbar spine degeneration. No spinal stenosis or other neural impingement.     Electronically Signed   By: Odessa Fleming M.D.   On: 10/30/2023 11:23   She reports that she has never smoked. She has never used smokeless tobacco. No results for input(s): "HGBA1C", "LABURIC" in the last 8760 hours.  Objective:  VS:  HT:    WT:   BMI:     BP:   HR: bpm  TEMP: ( )  RESP:  Physical Exam Vitals and nursing note reviewed.  HENT:     Head: Normocephalic and atraumatic.     Right Ear: External ear normal.     Left Ear: External ear normal.     Nose: Nose normal.     Mouth/Throat:     Mouth: Mucous membranes are moist.  Eyes:     Extraocular Movements: Extraocular movements intact.  Cardiovascular:     Rate and Rhythm: Normal rate.     Pulses: Normal pulses.  Pulmonary:     Effort: Pulmonary effort is normal.  Abdominal:     General: Abdomen is flat. There is no distension.  Musculoskeletal:        General:  Tenderness present.     Cervical back: Normal range of motion.     Comments: Patient rises from seated position to standing without difficulty. Good lumbar range of motion. No pain noted with facet loading. 5/5 strength noted with bilateral hip flexion, knee flexion/extension, ankle dorsiflexion/plantarflexion and EHL. No clonus noted bilaterally. No pain upon palpation of greater trochanters. Pain noted with internal rotation of left hip. Sensation intact bilaterally. Negative slump test bilaterally. Ambulates without aid, gait steady.     Skin:    General: Skin is warm and dry.     Capillary Refill: Capillary refill takes less than 2 seconds.  Neurological:     General: No  focal deficit present.     Mental Status: She is alert and oriented to person, place, and time.  Psychiatric:        Mood and Affect: Mood normal.        Behavior: Behavior normal.     Ortho Exam  Imaging: No results found.  Past Medical/Family/Surgical/Social History: Medications & Allergies reviewed per EMR, new medications updated. Patient Active Problem List   Diagnosis Date Noted   Closed fracture of nasal bones 04/26/2023   Mixed conductive and sensorineural hearing loss of right ear with restricted hearing of left ear 11/08/2022   Change in bowel function 11/07/2022   Mallory-Weiss tear 07/03/2022   Allergy    Anxiety    Arthritis    Cataract    Depression    DM (diabetes mellitus) (HCC)    Dyspnea    Heart murmur    History of kidney stones    Hyperlipidemia    Kidney stones    Sleep apnea    Status post dilation of esophageal narrowing    Asthma 01/16/2020   History of transient ischemic attack (TIA) 01/16/2020   Bilateral carotid artery stenosis 01/16/2020   Normocytic anemia 01/16/2020   Diabetes mellitus type II, non insulin dependent (HCC) 07/05/2019   Mild aortic stenosis 07/05/2019   Mitral regurgitation 07/05/2019   Pedal edema 05/06/2019   DOE (dyspnea on exertion) 05/06/2019   Hearing loss, mixed, bilateral 12/17/2018   Bilateral primary osteoarthritis of knee 10/16/2018   OSA (obstructive sleep apnea) 09/28/2018   Shoulder arthritis 02/22/2018   Primary osteoarthritis, left shoulder 12/11/2017   DDD (degenerative disc disease), lumbar 01/23/2017   History of diabetes mellitus 01/23/2017   History of depression 01/23/2017   History of asthma 01/23/2017   Dyslipidemia 01/23/2017   Age-related osteoporosis without current pathological fracture 01/23/2017   History of gastroesophageal reflux (GERD) 01/17/2017   Primary osteoarthritis of both knees 01/17/2017   Primary osteoarthritis of both feet 01/17/2017   Vitamin D deficiency 01/17/2017    Rheumatoid factor positive 12/13/2016   Retained orthopedic hardware 11/14/2016   Stiffness of left wrist joint 06/06/2016   Closed fracture of distal end of radius 06/05/2016   Acquired trigger finger 04/20/2016   Obstructive lung disease (HCC) 07/29/2015   H/O allergic rhinitis 07/29/2015   LPRD (laryngopharyngeal reflux disease) 07/29/2015   Chest pain 11/01/2013   Murmur 11/01/2013   DM 03/03/2010   ABDOMINAL PAIN RIGHT LOWER QUADRANT 03/03/2010   FULL INCONTINENCE OF FECES 03/03/2010   H/O adenomatous polyp of colon 2011   DECREASED HEARING 05/02/2008   Essential hypertension 05/02/2008   ESOPHAGITIS 05/02/2008   HIATAL HERNIA 05/02/2008   RECTAL INCONTINENCE 05/02/2008   SARCOIDOSIS 11/16/2007   Severe persistent asthma 11/16/2007   GERD 11/16/2007   IRRITABLE BOWEL SYNDROME 11/16/2007   Past  Medical History:  Diagnosis Date   ABDOMINAL PAIN RIGHT LOWER QUADRANT 03/03/2010   Qualifier: Diagnosis of  By: Juanda Chance MD, Hedwig Morton    Acquired trigger finger 04/20/2016   Age-related osteoporosis without current pathological fracture 01/23/2017   Allergy    Anxiety    Arthritis    Asthma    Bilateral carotid artery stenosis 01/16/2020   Bilateral primary osteoarthritis of knee 10/16/2018   Cataract    bilateral and removed   Change in bowel function 11/07/2022   Chest pain 11/01/2013   Closed fracture of distal end of radius 06/05/2016   Closed fracture of nasal bones 04/26/2023   Last Assessment & Plan: Formatting of this note might be different from the original. Nasal trauma. See history of present illness.  Denies nasal obstruction or concern over the appearance of the nose.  CT is reviewed and shows minimally displaced right nasal bone fracture. EXAM shows nasal dorsum in the midline.  Still little edema of the nasal dorsum.  Intranasal exam shows midline nasal septum.   DDD (degenerative disc disease), lumbar 01/23/2017   DECREASED HEARING 05/02/2008   Qualifier:  Diagnosis of   By: Candice Camp CMA (AAMA), Dottie      Replacing diagnoses that were inactivated after the 02/27/23 regulatory import   Depression    Diabetes mellitus type II, non insulin dependent (HCC) 07/05/2019   DM 03/03/2010   Qualifier: Diagnosis of  By: Katrinka Blazing CMA, Tresa Endo     DM (diabetes mellitus) (HCC)    DOE (dyspnea on exertion) 05/06/2019   Dyslipidemia 01/23/2017   Dyspnea    Esophageal reflux    ESOPHAGITIS 05/02/2008   Qualifier: Diagnosis of   By: Candice Camp CMA (AAMA), Dottie      Replacing diagnoses that were inactivated after the 02/27/23 regulatory import   Essential hypertension 05/02/2008   Qualifier: Diagnosis of  By: Nelson-Smith CMA (AAMA), Dottie     Full incontinence of feces 03/03/2010   Qualifier: Diagnosis of  By: Juanda Chance MD, Hedwig Morton    H/O adenomatous polyp of colon 2011   H/O allergic rhinitis 07/29/2015   Hearing loss, mixed, bilateral 12/17/2018   Heart murmur    no cardiologist, family Dr. Zollie Scale   HIATAL HERNIA 05/02/2008   Qualifier: Diagnosis of   By: Candice Camp CMA (AAMA), Dottie      Replacing diagnoses that were inactivated after the 02/27/23 regulatory import   History of asthma 01/23/2017   History of depression 01/23/2017   History of diabetes mellitus 01/23/2017   History of gastroesophageal reflux (GERD) 01/17/2017   History of kidney stones    History of transient ischemic attack (TIA) 01/16/2020   Hyperlipidemia    Irritable bowel syndrome    Kidney stones    LPRD (laryngopharyngeal reflux disease) 07/29/2015   Mallory-Weiss tear 07/03/2022   Mild aortic stenosis 07/05/2019   Mitral regurgitation 07/05/2019   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 Here for medical clearance for the right ear. Noted to have recent worsening of her right ear on audiogram at an outside facility.   Denies any pain.  In general, she is functioning well with her current hearing aid though she is looking at getting a new one.  Left ear with profound loss from prior surgery.  Audio   Murmur 11/01/2013   Normocytic anemia 01/16/2020   Obstructive lung disease (  HCC) 07/29/2015   OSA (obstructive sleep apnea) 09/28/2018   Pain in left wrist 06/06/2016   Pedal edema 05/06/2019   Primary osteoarthritis of both feet 01/17/2017   Primary osteoarthritis of both knees 01/17/2017   Primary osteoarthritis, left shoulder 12/11/2017   RECTAL INCONTINENCE 05/02/2008   Qualifier: Diagnosis of   By: Nelson-Smith CMA (AAMA), Dottie      Replacing diagnoses that were inactivated after the 02/27/23 regulatory import   Retained orthopedic hardware 11/14/2016   Rheumatoid factor positive 12/13/2016   Sarcoidosis    Severe persistent asthma 11/16/2007   Annotation: chronic Qualifier: Diagnosis of  By: Sherene Sires MD, [name]    Shoulder arthritis 02/22/2018   Sleep apnea    no cpap now   Status post dilation of esophageal narrowing    Stiffness of left wrist joint 06/06/2016   Stroke (HCC)    Vitamin D deficiency 01/17/2017   Family History  Problem Relation Age of Onset   Esophageal cancer Father    Diabetes Mother    Diabetes Maternal Grandmother    Colon cancer Neg Hx    Rectal cancer Neg Hx    Stomach cancer Neg Hx    Past Surgical History:  Procedure Laterality Date   BLADDER SURGERY     CATARACT EXTRACTION Bilateral    HARDWARE REMOVAL Left 01/12/2017   Procedure: HARDWARE REMOVAL left wrist;  Surgeon: Betha Loa, MD;  Location: [facility];  Service: Orthopedics;  Laterality: Left;   HEMORRHOID SURGERY     NASAL SEPTUM SURGERY     PARTIAL HYSTERECTOMY     REVERSE SHOULDER ARTHROPLASTY Left 02/22/2018   REVERSE SHOULDER ARTHROPLASTY Left 02/22/2018   Procedure: LEFT REVERSE SHOULDER ARTHROPLASTY;  Surgeon: Cammy Copa, MD;  Location: [facility] OR;  Service: Orthopedics;   Laterality: Left;   TONSILLECTOMY AND ADENOIDECTOMY     TRANSCAROTID ARTERY REVASCULARIZATION  Left 01/17/2020   Procedure: TRANSCAROTID ARTERY REVASCULARIZATION;  Surgeon: Nada Libman, MD;  Location: [facility] OR;  Service: Vascular;  Laterality: Left;   TRIGGER FINGER RELEASE Left 05/12/2016   Procedure: RELEASE TRIGGER FINGER/A-1 PULLEY INFECTION LEFT RING TENDON SHEATH INJECT RIGHT INDEX FINGER;  Surgeon: Betha Loa, MD;  Location: [facility];  Service: Orthopedics;  Laterality: Left;   TUBAL LIGATION     Social History   Occupational History   Occupation: retired  Tobacco Use   Smoking status: Never   Smokeless tobacco: Never  Vaping Use   Vaping status: Never Used  Substance and Sexual Activity   Alcohol use: No   Drug use: No   Sexual activity: Not on file

## 2023-11-01 ENCOUNTER — Encounter: Payer: Self-pay | Admitting: Physical Medicine and Rehabilitation

## 2023-11-01 ENCOUNTER — Ambulatory Visit (INDEPENDENT_AMBULATORY_CARE_PROVIDER_SITE_OTHER): Payer: Medicare HMO | Admitting: *Deleted

## 2023-11-01 DIAGNOSIS — J4550 Severe persistent asthma, uncomplicated: Secondary | ICD-10-CM | POA: Diagnosis not present

## 2023-11-13 ENCOUNTER — Ambulatory Visit: Payer: Medicare HMO | Admitting: Allergy and Immunology

## 2023-11-13 ENCOUNTER — Encounter: Payer: Self-pay | Admitting: Allergy and Immunology

## 2023-11-13 VITALS — BP 124/82 | HR 65 | Resp 16

## 2023-11-13 DIAGNOSIS — J4550 Severe persistent asthma, uncomplicated: Secondary | ICD-10-CM | POA: Diagnosis not present

## 2023-11-13 DIAGNOSIS — J3089 Other allergic rhinitis: Secondary | ICD-10-CM | POA: Diagnosis not present

## 2023-11-13 DIAGNOSIS — K219 Gastro-esophageal reflux disease without esophagitis: Secondary | ICD-10-CM | POA: Diagnosis not present

## 2023-11-13 DIAGNOSIS — G4733 Obstructive sleep apnea (adult) (pediatric): Secondary | ICD-10-CM | POA: Diagnosis not present

## 2023-11-13 DIAGNOSIS — Z862 Personal history of diseases of the blood and blood-forming organs and certain disorders involving the immune mechanism: Secondary | ICD-10-CM | POA: Diagnosis not present

## 2023-11-13 MED ORDER — FLUTICASONE-SALMETEROL 250-50 MCG/ACT IN AEPB: INHALATION_SPRAY | RESPIRATORY_TRACT | 4 refills | Status: DC

## 2023-11-13 NOTE — Patient Instructions (Addendum)
  1. Continue tezepelumab injections    2. Use the following every day:  A.  Advair 250 - 1 inhalation 2 times per day B.  Incruse - 1 inhalation 1 time per day C.  Flonase - 1 spray each nostril 1 time per day.    3. Continue omeprazole 40 twice a day + Famotidine 40 mg in evening  4. If needed:   A. nasal saline   B. Albuterol HFA or Duoneb   C. OTC Mucinex DM 1-2 tablet 1-2 times per day   D. ipratropium 0.06% nasal spray - 1-2 sprays each nostril every 6 hours   5. Return to clinic in 6 months or earlier if problem

## 2023-11-13 NOTE — Progress Notes (Signed)
[place] - [place] - [place] - [place]   Follow-up Note  Referring Provider: Buckner Malta, MD Primary Provider: Buckner Malta, MD Date of Office Visit: 11/13/2023  Subjective:   [name] (DOB: 20-Oct-1946) is a 77 y.o. 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  I last saw her in this clinic 15 May 2023.  She thinks that her airway is doing pretty well however there was a miscommunication between her and her pulmonologist.  She could not afford Trelegy because of the co-pay cost and we instructed her to use Spiriva and Advair but unfortunately her insurance company would not pay for Spiriva and her pulmonologist gave her Incruse but the miscommunication was the fact that she thought Incruse was to replace her Advair.  Thus, she is only using Incruse and as expected she has had to use the short acting bronchodilator just about every day for some issues with wheezing.  She has not required a systemic steroid to treat any type of airway issue.  She believes her nose is doing very well on her current therapy of using a nasal steroid.  She believes that her reflux is under good control with the combination of her proton pump inhibitor and H2 receptor blocker.  She has given up on using CPAP.  She did receive the flu vaccine.  Allergies as of 11/13/2023       Reactions   Codeine Nausea And Vomiting   Sulfa Antibiotics Other (See Comments)   Allergic per pt's mother         Medication List    acetaminophen 325 MG tablet Commonly known as: TYLENOL Take 2 tablets (650 mg total) by mouth every 6 (six) hours as needed for mild pain (or Fever >/= 101).   albuterol 108 (90 Base) MCG/ACT inhaler Commonly known as: VENTOLIN HFA Inhale 2 puffs into the  lungs every 6 (six) hours as needed for wheezing or shortness of breath.   alendronate 70 MG tablet Commonly known as: FOSAMAX Take 70 mg by mouth once a week.   aspirin EC 81 MG tablet Take 1 tablet by mouth daily.   atorvastatin 40 MG tablet Commonly known as: LIPITOR Take 40 mg by mouth at bedtime.   citalopram 20 MG tablet Commonly known as: CELEXA Take 20 mg by mouth daily.   diclofenac 25 MG EC tablet Commonly known as: VOLTAREN Take 25 mg by mouth daily.   dicyclomine 20 MG tablet Commonly known as: BENTYL Take 20 mg by mouth as needed for spasms.   EPINEPHrine 0.3 mg/0.3 mL Soaj injection Commonly known as: EPI-PEN Use as directed for life-threatening allergic reaction.   famotidine 40 MG tablet Commonly known as: PEPCID TAKE 1 TABLET BY MOUTH EVERY DAY AT BEDTIME   fluticasone 50 MCG/ACT nasal spray Commonly known as: FLONASE Place 1-2 sprays into both nostrils as needed for allergies or rhinitis.   fluticasone-salmeterol 250-50 MCG/ACT Aepb Commonly known as: ADVAIR Inhale one puff twice daily to prevent cough or wheeze. Rinse mouth after use.   gabapentin 100 MG capsule Commonly known as: NEURONTIN Take 100 mg by mouth as needed (arthritis pain).   Incruse Ellipta 62.5 MCG/ACT Aepb Generic drug: umeclidinium bromide Inhale 1 puff into the lungs daily.   ipratropium 0.06 % nasal spray Commonly known as: ATROVENT INSTILL  1-2 SPRAYS IN EACH NOSTRIL EVERY 6 HOURS IF needed   ipratropium-albuterol 0.5-2.5 (3) MG/3ML Soln Commonly known as: DUONEB Take 3 mLs by nebulization every 6 (six) hours as needed for wheezing or shortness of breath (shortness of breath/wheezing).   IRON PO Take 1 tablet by mouth daily.   levocetirizine 5 MG tablet Commonly known as: XYZAL Take 5 mg by mouth at bedtime.   metoprolol succinate 25 MG 24 hr tablet Commonly known as: TOPROL-XL Take 25 mg by mouth at bedtime.   montelukast 10 MG tablet Commonly known as:  SINGULAIR Take 10 mg by mouth daily.   Na Sulfate-K Sulfate-Mg Sulf 17.5-3.13-1.6 GM/177ML Soln Use as directed; may use generic; goodrx card if insurance will not cover generic   omeprazole 40 MG capsule Commonly known as: PRILOSEC Take 40 mg by mouth 2 (two) times daily.   PROBIOTIC DAILY PO Take 1 capsule by mouth daily.   tamsulosin 0.4 MG Caps capsule Commonly known as: FLOMAX Take 0.4 mg by mouth daily.   Telmisartan-amLODIPine 80-5 MG Tabs Take 1 tablet by mouth daily.   Tezspire 210 MG/1. Soaj Generic drug: Tezepelumab-ekko Inject 210 mg into the skin every 28 (twenty-eight) days.    Past Medical History:  Diagnosis Date   ABDOMINAL PAIN RIGHT LOWER QUADRANT 03/03/2010   Qualifier: Diagnosis of  By: Juanda Chance MD, Hedwig Morton    Acquired trigger finger 04/20/2016   Age-related osteoporosis without current pathological fracture 01/23/2017   Allergy    Anxiety    Arthritis    Asthma    Bilateral carotid artery stenosis 01/16/2020   Bilateral primary osteoarthritis of knee 10/16/2018   Cataract    bilateral and removed   Change in bowel function 11/07/2022   Chest pain 11/01/2013   Closed fracture of distal end of radius 06/05/2016   Closed fracture of nasal bones 04/26/2023   Last Assessment & Plan: Formatting of this note might be different from the original. Nasal trauma. See history of present illness.  Denies nasal obstruction or concern over the appearance of the nose.  CT is reviewed and shows minimally displaced right nasal bone fracture. EXAM shows nasal dorsum in the midline.  Still little edema of the nasal dorsum.  Intranasal exam shows midline nasal septum.   DDD (degenerative disc disease), lumbar 01/23/2017   DECREASED HEARING 05/02/2008   Qualifier: Diagnosis of   By: Nelson-Smith CMA (AAMA), Dottie      Replacing diagnoses that were inactivated after the 02/27/23 regulatory import   Depression    Diabetes mellitus type II, non insulin dependent (HCC)  07/05/2019   DM 03/03/2010   Qualifier: Diagnosis of  By: Katrinka Blazing CMA, Tresa Endo     DM (diabetes mellitus) (HCC)    DOE (dyspnea on exertion) 05/06/2019   Dyslipidemia 01/23/2017   Dyspnea    Esophageal reflux    ESOPHAGITIS 05/02/2008   Qualifier: Diagnosis of   By: Candice Camp CMA (AAMA), Dottie      Replacing diagnoses that were inactivated after the 02/27/23 regulatory import   Essential hypertension 05/02/2008   Qualifier: Diagnosis of  By: Nelson-Smith CMA (AAMA), Dottie     Full incontinence of feces 03/03/2010   Qualifier: Diagnosis of  By: Juanda Chance MD, Hedwig Morton    H/O adenomatous polyp of colon 2011   H/O allergic rhinitis 07/29/2015   Hearing loss, mixed, bilateral 12/17/2018   Heart murmur    no cardiologist, family Dr. Zollie Scale   HIATAL HERNIA 05/02/2008   Qualifier: Diagnosis of  By: Candice Camp CMA (AAMA), Dottie      Replacing diagnoses that were inactivated after the 02/27/23 regulatory import   History of asthma 01/23/2017   History of depression 01/23/2017   History of diabetes mellitus 01/23/2017   History of gastroesophageal reflux (GERD) 01/17/2017   History of kidney stones    History of transient ischemic attack (TIA) 01/16/2020   Hyperlipidemia    Irritable bowel syndrome    Kidney stones    LPRD (laryngopharyngeal reflux disease) 07/29/2015   Mallory-Weiss tear 07/03/2022   Mild aortic stenosis 07/05/2019   Mitral regurgitation 07/05/2019   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 Here for medical clearance for the right ear. Noted to have recent worsening of her right ear on audiogram at an outside facility.  Denies any pain.  In general, she is functioning well with her current hearing aid though she is looking at getting a new one.  Left ear with profound loss from prior surgery.  Audio   Murmur 11/01/2013    Normocytic anemia 01/16/2020   Obstructive lung disease (HCC) 07/29/2015   OSA (obstructive sleep apnea) 09/28/2018   Pain in left wrist 06/06/2016   Pedal edema 05/06/2019   Primary osteoarthritis of both feet 01/17/2017   Primary osteoarthritis of both knees 01/17/2017   Primary osteoarthritis, left shoulder 12/11/2017   RECTAL INCONTINENCE 05/02/2008   Qualifier: Diagnosis of   By: Candice Camp CMA (AAMA), Dottie      Replacing diagnoses that were inactivated after the 02/27/23 regulatory import   Retained orthopedic hardware 11/14/2016   Rheumatoid factor positive 12/13/2016   Sarcoidosis    Severe persistent asthma 11/16/2007   Annotation: chronic Qualifier: Diagnosis of  By: Sherene Sires MD, [name]    Shoulder arthritis 02/22/2018   Sleep apnea    no cpap now   Status post dilation of esophageal narrowing    Stiffness of left wrist joint 06/06/2016   Stroke (HCC)    Vitamin D deficiency 01/17/2017    Past Surgical History:  Procedure Laterality Date   BLADDER SURGERY     CATARACT EXTRACTION Bilateral    HARDWARE REMOVAL Left 01/12/2017   Procedure: HARDWARE REMOVAL left wrist;  Surgeon: Betha Loa, MD;  Location: [facility];  Service: Orthopedics;  Laterality: Left;   HEMORRHOID SURGERY     NASAL SEPTUM SURGERY     PARTIAL HYSTERECTOMY     REVERSE SHOULDER ARTHROPLASTY Left 02/22/2018   REVERSE SHOULDER ARTHROPLASTY Left 02/22/2018   Procedure: LEFT REVERSE SHOULDER ARTHROPLASTY;  Surgeon: Cammy Copa, MD;  Location: [facility] OR;  Service: Orthopedics;  Laterality: Left;   TONSILLECTOMY AND ADENOIDECTOMY     TRANSCAROTID ARTERY REVASCULARIZATION  Left 01/17/2020   Procedure: TRANSCAROTID ARTERY REVASCULARIZATION;  Surgeon: Nada Libman, MD;  Location: [facility] OR;  Service: Vascular;  Laterality: Left;   TRIGGER FINGER RELEASE Left 05/12/2016   Procedure: RELEASE TRIGGER FINGER/A-1 PULLEY INFECTION LEFT RING TENDON SHEATH INJECT RIGHT INDEX FINGER;  Surgeon: Betha Loa, MD;  Location: [facility];  Service: Orthopedics;  Laterality: Left;   TUBAL LIGATION      Review of systems negative except as noted in HPI / PMHx or noted below:  Review of Systems  Constitutional: Negative.   HENT: Negative.    Eyes: Negative.   Respiratory: Negative.    Cardiovascular: Negative.  Gastrointestinal: Negative.   Genitourinary: Negative.   Musculoskeletal: Negative.   Skin: Negative.   Neurological: Negative.   Endo/Heme/Allergies: Negative.   Psychiatric/Behavioral: Negative.       Objective:   Vitals:   11/13/23 1000  BP: 124/82  Pulse: 65  Resp: 16  SpO2: 98%          Physical Exam Constitutional:      Appearance: She is not diaphoretic.  HENT:     Head: Normocephalic.     Right Ear: Tympanic membrane, ear canal and external ear normal.     Left Ear: Tympanic membrane, ear canal and external ear normal.     Nose: Nose normal. No mucosal edema or rhinorrhea.     Mouth/Throat:     Pharynx: Uvula midline. No oropharyngeal exudate.  Eyes:     Conjunctiva/sclera: Conjunctivae normal.  Neck:     Thyroid: No thyromegaly.     Trachea: Trachea normal. No tracheal tenderness or tracheal deviation.  Cardiovascular:     Rate and Rhythm: Normal rate and regular rhythm.     Heart sounds: Normal heart sounds, S1 normal and S2 normal. No murmur heard. Pulmonary:     Effort: No respiratory distress.     Breath sounds: Normal breath sounds. No stridor. No wheezing or rales.  Lymphadenopathy:     Head:     Right side of head: No tonsillar adenopathy.     Left side of head: No tonsillar adenopathy.     Cervical: No cervical adenopathy.  Skin:    Findings: No erythema or rash.     Nails: There is no clubbing.  Neurological:     Mental Status: She is alert.     Diagnostics: Spirometry was performed and demonstrated an FEV1 of 1.20 at 69 % of predicted.  Assessment and Plan:   1. Severe persistent asthma without complication    2. History of sarcoidosis   3. Other allergic rhinitis   4. LPRD (laryngopharyngeal reflux disease)   5. OSA (obstructive sleep apnea)     1. Continue tezepelumab injections    2. Use the following every day:  A.  Advair 250 - 1 inhalation 2 times per day B.  Incruse - 1 inhalation 1 time per day C.  Flonase - 1 spray each nostril 1 time per day.    3. Continue omeprazole 40 twice a day + Famotidine 40 mg in evening  4. If needed:   A. nasal saline   B. Albuterol HFA or Duoneb   C. OTC Mucinex DM 1-2 tablet 1-2 times per day   D. ipratropium 0.06% nasal spray - 1-2 sprays each nostril every 6 hours   5. Return to clinic in 6 months or earlier if problem  We will have [place] remain on anti-inflammatory agents for her airway including use of anti-TSLP antibody.  She can use a combination of Advair and Incruse to replace Trelegy.  She actually appears to be doing okay regarding her respiratory tract issue on this plan.  She will remain on aggressive therapy for her LPR as noted above.  She is not going to treat sleep apnea at this point in time.  I will see her back in this clinic in 6 months or earlier if there is a problem.   Laurette Schimke, MD Allergy / Immunology [facility]

## 2023-11-13 NOTE — Progress Notes (Signed)
NA

## 2023-11-14 ENCOUNTER — Encounter: Payer: Self-pay | Admitting: Allergy and Immunology

## 2023-11-15 ENCOUNTER — Encounter: Payer: Medicare HMO | Admitting: Internal Medicine

## 2023-11-16 ENCOUNTER — Ambulatory Visit: Payer: Medicare HMO | Admitting: Physical Medicine and Rehabilitation

## 2023-11-16 ENCOUNTER — Other Ambulatory Visit: Payer: Self-pay

## 2023-11-16 VITALS — BP 190/65

## 2023-11-16 DIAGNOSIS — M5416 Radiculopathy, lumbar region: Secondary | ICD-10-CM

## 2023-11-16 MED ORDER — METHYLPREDNISOLONE ACETATE 40 MG/ML IJ SUSP
40.0000 mg | Freq: Once | INTRAMUSCULAR | Status: AC
  Administered 2023-11-16: 40 mg

## 2023-11-16 NOTE — Patient Instructions (Signed)

## 2023-11-16 NOTE — Procedures (Signed)
Lumbar Epidural Steroid Injection - Interlaminar Approach with Fluoroscopic Guidance  Patient: [name]      Date of Birth: 04-13-1946 MRN: [id number] PCP: Buckner Malta, MD      Visit Date: 11/16/2023   Universal Protocol:     Consent Given By: the patient  Position: PRONE  Additional Comments: Vital signs were monitored before and after the procedure. Patient was prepped and draped in the usual sterile fashion. The correct patient, procedure, and site was verified.   Injection Procedure Details:   Procedure diagnoses: Lumbar radiculopathy [M54.16]   Meds Administered:  Meds ordered this encounter  Medications   methylPREDNISolone acetate (DEPO-MEDROL) injection 40 mg     Laterality: Left  Location/Site:  L4-5  Needle: 3.5 in., 20 ga. Tuohy  Needle Placement: Paramedian epidural  Findings:   -Comments: Excellent flow of contrast into the epidural space.  Procedure Details: Using a paramedian approach from the side mentioned above, the region overlying the inferior lamina was localized under fluoroscopic visualization and the soft tissues overlying this structure were infiltrated with 4 ml. of 1% Lidocaine without Epinephrine. The Tuohy needle was inserted into the epidural space using a paramedian approach.   The epidural space was localized using loss of resistance along with counter oblique bi-planar fluoroscopic views.  After negative aspirate for air, blood, and CSF, a 2 ml. volume of Isovue-250 was injected into the epidural space and the flow of contrast was observed. Radiographs were obtained for documentation purposes.    The injectate was administered into the level noted above.   Additional Comments:  The patient tolerated the procedure well Dressing: 2 x 2 sterile gauze and Band-Aid    Post-procedure details: Patient was observed during the procedure. Post-procedure instructions were reviewed.  Patient left the clinic in stable  condition.

## 2023-11-16 NOTE — Progress Notes (Signed)
[name] - 77 y.o. female MRN [id number]  Date of birth: 04-20-1946  Office Visit Note: Visit Date: 11/16/2023 PCP: Buckner Malta, MD Referred by: Buckner Malta, MD  Subjective: Chief Complaint  Patient presents with   Lower Back - Pain   HPI:  [name] is a 77 y.o. female who comes in today at the request of Ellin Goodie, FNP for planned Left L4-5 Lumbar Interlaminar epidural steroid injection with fluoroscopic guidance.  The patient has failed conservative care including home exercise, medications, time and activity modification.  This injection will be diagnostic and hopefully therapeutic.  Please see requesting physician notes for further details and justification.   ROS Otherwise per HPI.  Assessment & Plan: Visit Diagnoses:    ICD-10-CM   1. Lumbar radiculopathy  M54.16 XR C-ARM NO REPORT    Epidural Steroid injection    methylPREDNISolone acetate (DEPO-MEDROL) injection 40 mg      Plan: No additional findings.   Meds & Orders:  Meds ordered this encounter  Medications   methylPREDNISolone acetate (DEPO-MEDROL) injection 40 mg    Orders Placed This Encounter  Procedures   XR C-ARM NO REPORT   Epidural Steroid injection    Follow-up: Return for As scheduled.   Procedures: No procedures performed  Lumbar Epidural Steroid Injection - Interlaminar Approach with Fluoroscopic Guidance  Patient: [name]      Date of Birth: [birth year] MRN: [id number] PCP: Buckner Malta, MD      Visit Date: 11/16/2023   Universal Protocol:     Consent Given By: the patient  Position: PRONE  Additional Comments: Vital signs were monitored before and after the procedure. Patient was prepped and draped in the usual sterile fashion. The correct patient, procedure, and site was verified.   Injection Procedure Details:   Procedure diagnoses: Lumbar radiculopathy [M54.16]   Meds Administered:  Meds ordered this encounter  Medications    methylPREDNISolone acetate (DEPO-MEDROL) injection 40 mg     Laterality: Left  Location/Site:  L4-5  Needle: 3.5 in., 20 ga. Tuohy  Needle Placement: Paramedian epidural  Findings:   -Comments: Excellent flow of contrast into the epidural space.  Procedure Details: Using a paramedian approach from the side mentioned above, the region overlying the inferior lamina was localized under fluoroscopic visualization and the soft tissues overlying this structure were infiltrated with 4 ml. of 1% Lidocaine without Epinephrine. The Tuohy needle was inserted into the epidural space using a paramedian approach.   The epidural space was localized using loss of resistance along with counter oblique bi-planar fluoroscopic views.  After negative aspirate for air, blood, and CSF, a 2 ml. volume of Isovue-250 was injected into the epidural space and the flow of contrast was observed. Radiographs were obtained for documentation purposes.    The injectate was administered into the level noted above.   Additional Comments:  The patient tolerated the procedure well Dressing: 2 x 2 sterile gauze and Band-Aid    Post-procedure details: Patient was observed during the procedure. Post-procedure instructions were reviewed.  Patient left the clinic in stable condition.   Clinical History: CLINICAL DATA:  77 year old female with low back pain radiating down the leg.   EXAM: MRI LUMBAR SPINE WITHOUT CONTRAST   TECHNIQUE: Multiplanar, multisequence MR imaging of the lumbar spine was performed. No intravenous contrast was administered.   COMPARISON:  Lumbar radiographs 02/08/2023.  Lumbar MRI 04/09/2020.   FINDINGS: Segmentation: Normal on the comparison radiographs, the same numbering system used on  the previous MRI.   Alignment: Chronic straightening of lumbar lordosis appears stable since 2021. Mild dextroconvex upper and levoconvex mid to lower lumbar scoliosis as noted on the comparison  radiographs (series 14 today). There is subtle degenerative anterolisthesis of L3 on L4. See additional details of that level below.   Vertebrae: Normal background bone marrow signal and maintained vertebral height. Degenerative endplate marrow signal changes at L3-L4 including mild vertebral marrow edema eccentric to the right (series 107, image 10).   No other marrow edema or acute osseous abnormality. Intact visible sacrum and SI joints.   Conus medullaris and cauda equina: Conus extends to the L1-L2 level. No lower spinal cord or conus signal abnormality. Generally normal cauda equina nerve roots.   Paraspinal and other soft tissues: Negative.   Disc levels:   Visible lower thoracic levels are normal for age through T12-L1.   L1-L2: Mild disc bulging asymmetric to the left. Borderline to mild left facet hypertrophy. No stenosis.   L2-L3: Similar mild asymmetric disc bulging to the left. Mild left facet and mild ligament flavum hypertrophy. No stenosis.   L3-L4: Subtle anterolisthesis. Disc space loss since 2021. Asymmetric disc bulging, bulky on the right side. Progressed and up to moderate right side facet and ligament flavum hypertrophy. No significant spinal stenosis. Borderline to mild right lateral recess stenosis (right L4 nerve level). And moderate progressed right L3 neural foraminal stenosis (series 105, image 7).   L4-L5: Subtle anterolisthesis also at this level. But only minor disc bulging. Mild facet and ligament flavum hypertrophy. No stenosis.   L5-S1:  Negative.   IMPRESSION: 1. Mild lumbar scoliosis. Progressed L3-L4 disc, endplate, and posterior element degeneration since 2021 with subtle anterolisthesis there. Increased and moderate right neural foraminal stenosis, mild right lateral recess stenosis. Query Right L3 and/or L4 radiculitis. 2. Otherwise mild for age lumbar spine degeneration. No spinal stenosis or other neural impingement.      Electronically Signed   By: Odessa Fleming M.D.   On: 10/30/2023 11:23     Objective:  VS:  HT:    WT:   BMI:     BP:(!) 190/65  HR: bpm  TEMP: ( )  RESP:  Physical Exam Vitals and nursing note reviewed.  Constitutional:      General: She is not in acute distress.    Appearance: Normal appearance. She is not ill-appearing.  HENT:     Head: Normocephalic and atraumatic.     Right Ear: External ear normal.     Left Ear: External ear normal.  Eyes:     Extraocular Movements: Extraocular movements intact.  Cardiovascular:     Rate and Rhythm: Normal rate.     Pulses: Normal pulses.  Pulmonary:     Effort: Pulmonary effort is normal. No respiratory distress.  Abdominal:     General: There is no distension.     Palpations: Abdomen is soft.  Musculoskeletal:        General: Tenderness present.     Cervical back: Neck supple.     Right lower leg: No edema.     Left lower leg: No edema.     Comments: Patient has good distal strength with no pain over the greater trochanters.  No clonus or focal weakness.  Skin:    Findings: No erythema, lesion or rash.  Neurological:     General: No focal deficit present.     Mental Status: She is alert and oriented to person, place, and time.  Sensory: No sensory deficit.     Motor: No weakness or abnormal muscle tone.     Coordination: Coordination normal.  Psychiatric:        Mood and Affect: Mood normal.        Behavior: Behavior normal.      Imaging: No results found.

## 2023-11-20 ENCOUNTER — Other Ambulatory Visit: Payer: Self-pay | Admitting: *Deleted

## 2023-11-20 MED ORDER — INCRUSE ELLIPTA 62.5 MCG/ACT IN AEPB: 1.0000 | INHALATION_SPRAY | Freq: Every day | RESPIRATORY_TRACT | 4 refills | Status: AC

## 2023-11-27 ENCOUNTER — Telehealth: Payer: Self-pay | Admitting: Internal Medicine

## 2023-11-27 ENCOUNTER — Other Ambulatory Visit: Payer: Self-pay

## 2023-11-27 DIAGNOSIS — K219 Gastro-esophageal reflux disease without esophagitis: Secondary | ICD-10-CM

## 2023-11-27 DIAGNOSIS — K589 Irritable bowel syndrome without diarrhea: Secondary | ICD-10-CM

## 2023-11-27 MED ORDER — NA SULFATE-K SULFATE-MG SULF 17.5-3.13-1.6 GM/177ML PO SOLN: ORAL | 0 refills | Status: DC

## 2023-11-27 NOTE — Telephone Encounter (Signed)
Spoke to patient advised prep was sent to [facility] pharmacy

## 2023-11-27 NOTE — Telephone Encounter (Signed)
Patient called stated she still doesn't have her prep medication and to be sent to [facility] in [place]

## 2023-11-29 ENCOUNTER — Telehealth: Payer: Self-pay | Admitting: *Deleted

## 2023-11-29 NOTE — Telephone Encounter (Signed)
 Dr. [name],  This pt is scheduled with you on 12/05/23.  She is a documented difficult intubation and her procedure will need to be done at the hospital.   Thanks,  Cathlyn Parsons

## 2023-11-30 ENCOUNTER — Telehealth: Payer: Self-pay

## 2023-11-30 ENCOUNTER — Ambulatory Visit: Payer: Medicare HMO | Admitting: Physical Medicine and Rehabilitation

## 2023-11-30 ENCOUNTER — Encounter: Payer: Self-pay | Admitting: Internal Medicine

## 2023-11-30 ENCOUNTER — Other Ambulatory Visit: Payer: Self-pay

## 2023-11-30 ENCOUNTER — Ambulatory Visit: Payer: Medicare HMO

## 2023-11-30 DIAGNOSIS — K589 Irritable bowel syndrome without diarrhea: Secondary | ICD-10-CM

## 2023-11-30 DIAGNOSIS — K5900 Constipation, unspecified: Secondary | ICD-10-CM

## 2023-11-30 DIAGNOSIS — K449 Diaphragmatic hernia without obstruction or gangrene: Secondary | ICD-10-CM

## 2023-11-30 DIAGNOSIS — R194 Change in bowel habit: Secondary | ICD-10-CM

## 2023-11-30 DIAGNOSIS — K219 Gastro-esophageal reflux disease without esophagitis: Secondary | ICD-10-CM

## 2023-11-30 NOTE — Telephone Encounter (Signed)
 Patient is returning your call.

## 2023-11-30 NOTE — Telephone Encounter (Signed)
 Spoke to patient advised procedure will be done at [facility] went over new procedure instructions also mailed instructions to patient.

## 2023-12-04 ENCOUNTER — Ambulatory Visit (INDEPENDENT_AMBULATORY_CARE_PROVIDER_SITE_OTHER): Payer: Medicare Other | Admitting: *Deleted

## 2023-12-04 ENCOUNTER — Other Ambulatory Visit: Payer: Self-pay | Admitting: *Deleted

## 2023-12-04 DIAGNOSIS — J4550 Severe persistent asthma, uncomplicated: Secondary | ICD-10-CM | POA: Diagnosis not present

## 2023-12-04 MED ORDER — IPRATROPIUM-ALBUTEROL 0.5-2.5 (3) MG/3ML IN SOLN: 3.0000 mL | Freq: Four times a day (QID) | RESPIRATORY_TRACT | 1 refills | Status: AC | PRN

## 2023-12-05 ENCOUNTER — Encounter: Payer: Medicare HMO | Admitting: Internal Medicine

## 2023-12-08 ENCOUNTER — Ambulatory Visit: Payer: Medicare Other | Admitting: Physical Medicine and Rehabilitation

## 2023-12-08 ENCOUNTER — Encounter: Payer: Self-pay | Admitting: Physical Medicine and Rehabilitation

## 2023-12-08 DIAGNOSIS — M5442 Lumbago with sciatica, left side: Secondary | ICD-10-CM | POA: Diagnosis not present

## 2023-12-08 DIAGNOSIS — M5441 Lumbago with sciatica, right side: Secondary | ICD-10-CM

## 2023-12-08 DIAGNOSIS — M25552 Pain in left hip: Secondary | ICD-10-CM | POA: Diagnosis not present

## 2023-12-08 DIAGNOSIS — R1032 Left lower quadrant pain: Secondary | ICD-10-CM

## 2023-12-08 DIAGNOSIS — G8929 Other chronic pain: Secondary | ICD-10-CM

## 2023-12-08 NOTE — Progress Notes (Signed)
She does not feel like the injection helped she is hurting still.  She stated it isnt any worse but it also is not any better.  Bil pain that shoots down both legs. She feels like at times her L leg is going to give way.

## 2023-12-08 NOTE — Progress Notes (Signed)
 [name] - 78 y.o. female MRN [id number]  Date of birth: Dec 29, 1945  Office Visit Note: Visit Date: 12/08/2023 PCP: Clemmie Nest, MD Referred by: Clemmie Nest, MD  Subjective: Chief Complaint  Patient presents with   Lower Back - Pain   HPI: [name] is a 78 y.o. female who comes in today for evaluation of chronic, worsening and severe bilateral lower back pain radiating down both legs, left greater than right. She does reports pain to left lateral hip and groin. Pain ongoing for several years, worsens with walking and walking up stairs. States she feels left leg is going to give way. Also reports having to lift left leg to get into car. She describes pain as sore and burning sensation, currently rates as 8 out of 10. Some relief of pain with home exercise regimen, rest and use of medications. Recent lumbar MRI imaging exhibits mild lumbar scoliosis, moderate right side facet and ligament flavum hypertrophy at L3-L4, there is also moderate progressed right sided L3 foraminal stenosis. No high grade spinal canal stenosis noted. Prior lumbar radiographs does show osteoarthritis to hips bilaterally, left greater than right. She underwent left intra-articular hip injection with Dr. Eldonna in 2021 that provided some relief of pain. More recently we performed left L4-L5 interlaminar epidural steroid injection in our office on 11/16/2023 with no relief of pain. Patient denies focal weakness, numbness and tingling. No recent trauma or falls.      Review of Systems  Musculoskeletal:  Positive for back pain and joint pain.  Neurological:  Negative for tingling, sensory change, focal weakness and weakness.  All other systems reviewed and are negative.  Otherwise per HPI.  Assessment & Plan: Visit Diagnoses:    ICD-10-CM   1. Chronic bilateral low back pain with bilateral sciatica  M54.42 Ambulatory referral to Physical Medicine Rehab   M54.41    G89.29     2. Groin  pain, chronic, left  R10.32 Ambulatory referral to Physical Medicine Rehab   G89.29     3. Pain in left hip  M25.552 Ambulatory referral to Physical Medicine Rehab       Plan: Findings:  Chronic, worsening and severe bilateral lower back pain radiating down both legs, left greater than right. Patient continues to have severe pain despite good conservative therapies such as home exercise regimen, rest and use of medications. Patients clinical presentation does seem more left hip related, she has left groin and lateral hip pain with internal rotation, positive  Stinchfield on the left today. There are arthritic changes noted to hips bilaterally, left greater than right. We discussed treatment plan in detail today, next step is to perform diagnostic and hopefully therapeutic lett intra-articular hip injection. If good relief of pain with injection would consider referral to orthopedic surgeon for further evaluation. Patient has no questions at this time. No red flag symptoms noted upon exam today.     Meds & Orders: No orders of the defined types were placed in this encounter.   Orders Placed This Encounter  Procedures   Ambulatory referral to Physical Medicine Rehab    Follow-up: Return for Left intra-articular hip injection.   Procedures: No procedures performed      Clinical History: CLINICAL DATA:  78 year old female with low back pain radiating down the leg.   EXAM: MRI LUMBAR SPINE WITHOUT CONTRAST   TECHNIQUE: Multiplanar, multisequence MR imaging of the lumbar spine was performed. No intravenous contrast was administered.   COMPARISON:  Lumbar radiographs  02/08/2023.  Lumbar MRI 04/09/2020.   FINDINGS: Segmentation: Normal on the comparison radiographs, the same numbering system used on the previous MRI.   Alignment: Chronic straightening of lumbar lordosis appears stable since 2021. Mild dextroconvex upper and levoconvex mid to lower lumbar scoliosis as noted on the  comparison radiographs (series 14 today). There is subtle degenerative anterolisthesis of L3 on L4. See additional details of that level below.   Vertebrae: Normal background bone marrow signal and maintained vertebral height. Degenerative endplate marrow signal changes at L3-L4 including mild vertebral marrow edema eccentric to the right (series 107, image 10).   No other marrow edema or acute osseous abnormality. Intact visible sacrum and SI joints.   Conus medullaris and cauda equina: Conus extends to the L1-L2 level. No lower spinal cord or conus signal abnormality. Generally normal cauda equina nerve roots.   Paraspinal and other soft tissues: Negative.   Disc levels:   Visible lower thoracic levels are normal for age through T12-L1.   L1-L2: Mild disc bulging asymmetric to the left. Borderline to mild left facet hypertrophy. No stenosis.   L2-L3: Similar mild asymmetric disc bulging to the left. Mild left facet and mild ligament flavum hypertrophy. No stenosis.   L3-L4: Subtle anterolisthesis. Disc space loss since 2021. Asymmetric disc bulging, bulky on the right side. Progressed and up to moderate right side facet and ligament flavum hypertrophy. No significant spinal stenosis. Borderline to mild right lateral recess stenosis (right L4 nerve level). And moderate progressed right L3 neural foraminal stenosis (series 105, image 7).   L4-L5: Subtle anterolisthesis also at this level. But only minor disc bulging. Mild facet and ligament flavum hypertrophy. No stenosis.   L5-S1:  Negative.   IMPRESSION: 1. Mild lumbar scoliosis. Progressed L3-L4 disc, endplate, and posterior element degeneration since 2021 with subtle anterolisthesis there. Increased and moderate right neural foraminal stenosis, mild right lateral recess stenosis. Query Right L3 and/or L4 radiculitis. 2. Otherwise mild for age lumbar spine degeneration. No spinal stenosis or other neural  impingement.     Electronically Signed   By: VEAR Hurst M.D.   On: 10/30/2023 11:23   She reports that she has never smoked. She has never used smokeless tobacco. No results for input(s): HGBA1C, LABURIC in the last 8760 hours.  Objective:  VS:  HT:    WT:   BMI:     BP:   HR: bpm  TEMP: ( )  RESP:  Physical Exam Vitals and nursing note reviewed.  HENT:     Head: Normocephalic and atraumatic.     Right Ear: External ear normal.     Left Ear: External ear normal.     Nose: Nose normal.     Mouth/Throat:     Mouth: Mucous membranes are moist.  Eyes:     Extraocular Movements: Extraocular movements intact.  Cardiovascular:     Rate and Rhythm: Normal rate.     Pulses: Normal pulses.  Pulmonary:     Effort: Pulmonary effort is normal.  Abdominal:     General: Abdomen is flat. There is no distension.  Musculoskeletal:        General: Tenderness present.     Cervical back: Normal range of motion.     Comments: Patient rises from seated position to standing without difficulty. Good lumbar range of motion. No pain noted with facet loading. 5/5 strength noted with bilateral hip flexion, knee flexion/extension, ankle dorsiflexion/plantarflexion and EHL. No clonus noted bilaterally. No pain upon palpation of  greater trochanters. Pain noted with internal rotation of left hip, positive Stinchfield on the left. Sensation intact bilaterally. Negative slump test bilaterally. Antalgic gait noted.    Skin:    General: Skin is warm and dry.     Capillary Refill: Capillary refill takes less than 2 seconds.  Neurological:     General: No focal deficit present.     Mental Status: She is alert and oriented to person, place, and time.  Psychiatric:        Mood and Affect: Mood normal.        Behavior: Behavior normal.     Ortho Exam  Imaging: No results found.  Past Medical/Family/Surgical/Social History: Medications & Allergies reviewed per EMR, new medications updated. Patient  Active Problem List   Diagnosis Date Noted   Closed fracture of nasal bones 04/26/2023   Mixed conductive and sensorineural hearing loss of right ear with restricted hearing of left ear 11/08/2022   Change in bowel function 11/07/2022   Mallory-Weiss tear 07/03/2022   Allergy    Anxiety    Arthritis    Cataract    Depression    DM (diabetes mellitus) (HCC)    Dyspnea    Heart murmur    History of kidney stones    Hyperlipidemia    Kidney stones    Sleep apnea    Status post dilation of esophageal narrowing    Asthma 01/16/2020   History of transient ischemic attack (TIA) 01/16/2020   Bilateral carotid artery stenosis 01/16/2020   Normocytic anemia 01/16/2020   Diabetes mellitus type II, non insulin  dependent (HCC) 07/05/2019   Mild aortic stenosis 07/05/2019   Mitral regurgitation 07/05/2019   Pedal edema 05/06/2019   DOE (dyspnea on exertion) 05/06/2019   Hearing loss, mixed, bilateral 12/17/2018   Bilateral primary osteoarthritis of knee 10/16/2018   OSA (obstructive sleep apnea) 09/28/2018   Shoulder arthritis 02/22/2018   Primary osteoarthritis, left shoulder 12/11/2017   DDD (degenerative disc disease), lumbar 01/23/2017   History of diabetes mellitus 01/23/2017   History of depression 01/23/2017   History of asthma 01/23/2017   Dyslipidemia 01/23/2017   Age-related osteoporosis without current pathological fracture 01/23/2017   History of gastroesophageal reflux (GERD) 01/17/2017   Primary osteoarthritis of both knees 01/17/2017   Primary osteoarthritis of both feet 01/17/2017   Vitamin D  deficiency 01/17/2017   Rheumatoid factor positive 12/13/2016   Retained orthopedic hardware 11/14/2016   Stiffness of left wrist joint 06/06/2016   Closed fracture of distal end of radius 06/05/2016   Acquired trigger finger 04/20/2016   Obstructive lung disease (HCC) 07/29/2015   H/O allergic rhinitis 07/29/2015   LPRD (laryngopharyngeal reflux disease) 07/29/2015   Chest  pain 11/01/2013   Murmur 11/01/2013   DM 03/03/2010   ABDOMINAL PAIN RIGHT LOWER QUADRANT 03/03/2010   FULL INCONTINENCE OF FECES 03/03/2010   H/O adenomatous polyp of colon 2011   DECREASED HEARING 05/02/2008   Essential hypertension 05/02/2008   ESOPHAGITIS 05/02/2008   HIATAL HERNIA 05/02/2008   RECTAL INCONTINENCE 05/02/2008   SARCOIDOSIS 11/16/2007   Severe persistent asthma 11/16/2007   GERD 11/16/2007   IRRITABLE BOWEL SYNDROME 11/16/2007   Past Medical History:  Diagnosis Date   ABDOMINAL PAIN RIGHT LOWER QUADRANT 03/03/2010   Qualifier: Diagnosis of  By: Obie MD, Princella HERO    Acquired trigger finger 04/20/2016   Age-related osteoporosis without current pathological fracture 01/23/2017   Allergy    Anxiety    Arthritis    Asthma  Bilateral carotid artery stenosis 01/16/2020   Bilateral primary osteoarthritis of knee 10/16/2018   Cataract    bilateral and removed   Change in bowel function 11/07/2022   Chest pain 11/01/2013   Closed fracture of distal end of radius 06/05/2016   Closed fracture of nasal bones 04/26/2023   Last Assessment & Plan: Formatting of this note might be different from the original. Nasal trauma. See history of present illness.  Denies nasal obstruction or concern over the appearance of the nose.  CT is reviewed and shows minimally displaced right nasal bone fracture. EXAM shows nasal dorsum in the midline.  Still little edema of the nasal dorsum.  Intranasal exam shows midline nasal septum.   DDD (degenerative disc disease), lumbar 01/23/2017   DECREASED HEARING 05/02/2008   Qualifier: Diagnosis of   By: Marcelo CMA (AAMA), Dottie      Replacing diagnoses that were inactivated after the 02/27/23 regulatory import   Depression    Diabetes mellitus type II, non insulin  dependent (HCC) 07/05/2019   DM 03/03/2010   Qualifier: Diagnosis of  By: Claudene CMA, Burnard     DM (diabetes mellitus) (HCC)    DOE (dyspnea on exertion) 05/06/2019    Dyslipidemia 01/23/2017   Dyspnea    Esophageal reflux    ESOPHAGITIS 05/02/2008   Qualifier: Diagnosis of   By: Marcelo CMA (AAMA), Dottie      Replacing diagnoses that were inactivated after the 02/27/23 regulatory import   Essential hypertension 05/02/2008   Qualifier: Diagnosis of  By: Nelson-Smith CMA (AAMA), Dottie     Full incontinence of feces 03/03/2010   Qualifier: Diagnosis of  By: Obie MD, Princella HERO    H/O adenomatous polyp of colon 2011   H/O allergic rhinitis 07/29/2015   Hearing loss, mixed, bilateral 12/17/2018   Heart murmur    no cardiologist, family Dr. ardelle   HIATAL HERNIA 05/02/2008   Qualifier: Diagnosis of   By: Marcelo CMA (AAMA), Dottie      Replacing diagnoses that were inactivated after the 02/27/23 regulatory import   History of asthma 01/23/2017   History of depression 01/23/2017   History of diabetes mellitus 01/23/2017   History of gastroesophageal reflux (GERD) 01/17/2017   History of kidney stones    History of transient ischemic attack (TIA) 01/16/2020   Hyperlipidemia    Irritable bowel syndrome    Kidney stones    LPRD (laryngopharyngeal reflux disease) 07/29/2015   Mallory-Weiss tear 07/03/2022   Mild aortic stenosis 07/05/2019   Mitral regurgitation 07/05/2019   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 Here for medical clearance for the right ear. Noted to have recent worsening of her right ear on audiogram at an outside facility.  Denies any pain.  In general, she is functioning well with her current hearing aid though she is looking at getting a new one.  Left ear with profound loss from prior surgery.  Audio   Murmur 11/01/2013   Normocytic anemia 01/16/2020   Obstructive lung disease (HCC) 07/29/2015   OSA (obstructive sleep apnea) 09/28/2018   Pain in left wrist 06/06/2016    Pedal edema 05/06/2019   Primary osteoarthritis of both feet 01/17/2017   Primary osteoarthritis of both knees 01/17/2017   Primary osteoarthritis, left shoulder 12/11/2017   RECTAL INCONTINENCE 05/02/2008   Qualifier: Diagnosis of   By: Marcelo  CMA (AAMA), Dottie      Replacing diagnoses that were inactivated after the 02/27/23 regulatory import   Retained orthopedic hardware 11/14/2016   Rheumatoid factor positive 12/13/2016   Sarcoidosis    Severe persistent asthma 11/16/2007   Annotation: chronic Qualifier: Diagnosis of  By: Darlean MD, Ozell NOVAK    Shoulder arthritis 02/22/2018   Sleep apnea    no cpap now   Status post dilation of esophageal narrowing    Stiffness of left wrist joint 06/06/2016   Stroke (HCC)    Vitamin D  deficiency 01/17/2017   Family History  Problem Relation Age of Onset   Esophageal cancer Father    Diabetes Mother    Diabetes Maternal Grandmother    Colon cancer Neg Hx    Rectal cancer Neg Hx    Stomach cancer Neg Hx    Past Surgical History:  Procedure Laterality Date   BLADDER SURGERY     CATARACT EXTRACTION Bilateral    HARDWARE REMOVAL Left 01/12/2017   Procedure: HARDWARE REMOVAL left wrist;  Surgeon: Franky Curia, MD;  Location: [facility];  Service: Orthopedics;  Laterality: Left;   HEMORRHOID SURGERY     NASAL SEPTUM SURGERY     PARTIAL HYSTERECTOMY     REVERSE SHOULDER ARTHROPLASTY Left 02/22/2018   REVERSE SHOULDER ARTHROPLASTY Left 02/22/2018   Procedure: LEFT REVERSE SHOULDER ARTHROPLASTY;  Surgeon: Addie Cordella Hamilton, MD;  Location[facility];  Service: Orthopedics;  Laterality: Left;   TONSILLECTOMY AND ADENOIDECTOMY     TRANSCAROTID ARTERY REVASCULARIZATION  Left 01/17/2020   Procedure: TRANSCAROTID ARTERY REVASCULARIZATION;  Surgeon: Serene Gaile ORN, MD;  Location: [facility] OR;  Service: Vascular;  Laterality: Left;   TRIGGER FINGER RELEASE Left 05/12/2016   Procedure: RELEASE TRIGGER FINGER/A-1 PULLEY INFECTION LEFT RING  TENDON SHEATH INJECT RIGHT INDEX FINGER;  Surgeon: Franky Curia, MD;  Location: [facility];  Service: Orthopedics;  Laterality: Left;   TUBAL LIGATION     Social History   Occupational History   Occupation: retired  Tobacco Use   Smoking status: Never   Smokeless tobacco: Never  Vaping Use   Vaping status: Never Used  Substance and Sexual Activity   Alcohol use: No   Drug use: No   Sexual activity: Not on file

## 2023-12-12 ENCOUNTER — Ambulatory Visit (INDEPENDENT_AMBULATORY_CARE_PROVIDER_SITE_OTHER): Payer: Medicare Other

## 2023-12-12 ENCOUNTER — Other Ambulatory Visit: Payer: Self-pay

## 2023-12-12 ENCOUNTER — Ambulatory Visit: Payer: Medicare Other | Admitting: Podiatry

## 2023-12-12 DIAGNOSIS — M778 Other enthesopathies, not elsewhere classified: Secondary | ICD-10-CM

## 2023-12-12 DIAGNOSIS — M2041 Other hammer toe(s) (acquired), right foot: Secondary | ICD-10-CM | POA: Diagnosis not present

## 2023-12-12 DIAGNOSIS — M79674 Pain in right toe(s): Secondary | ICD-10-CM | POA: Diagnosis not present

## 2023-12-12 DIAGNOSIS — M2042 Other hammer toe(s) (acquired), left foot: Secondary | ICD-10-CM

## 2023-12-12 DIAGNOSIS — M25572 Pain in left ankle and joints of left foot: Secondary | ICD-10-CM | POA: Diagnosis not present

## 2023-12-12 DIAGNOSIS — E119 Type 2 diabetes mellitus without complications: Secondary | ICD-10-CM

## 2023-12-12 DIAGNOSIS — B351 Tinea unguium: Secondary | ICD-10-CM | POA: Diagnosis not present

## 2023-12-12 DIAGNOSIS — M79675 Pain in left toe(s): Secondary | ICD-10-CM | POA: Diagnosis not present

## 2023-12-12 NOTE — Progress Notes (Signed)
   12/12/2023  Patient ID: [name], female   DOB: 1946-04-02, 78 y.o.   MRN: [id number]     2025 Medication Assistance Renewal Application Summary:  Patient was outreached regarding medication assistance renewal for 2025. Verified address, anticipated insurance for 2025, and income has not changed. Patient remains interested in PAP for 2025 for Ozempic , no other new medications were identified for medication assistance.    Medication Assistance Findings:  Medication assistance needs identified: Ozempic      Additional medication assistance options reviewed with patient as warranted:  No other options identified  Plan: I will route patient assistance letter to pharmacy technician who will coordinate patient assistance program application process for medications listed above.  Pharmacy technician will assist with obtaining all required documents from both patient and provider(s) and submit application(s) once completed.    Thank you for allowing pharmacy to be a part of this patient's care.  Heather Factor, PharmD Clinical Pharmacist  [phone]

## 2023-12-12 NOTE — Progress Notes (Signed)
 Chief Complaint  Patient presents with   [facility]    DFC , no A1c listed and did not take FBS this morning. Take ASA 81    HPI: 78 y.o. female presents today primarily for diabetic footcare.  She is unsure of her last A1c.  She presents with painful, elongated, dystrophic toenails that she is unable to maintain herself due to nail dystrophy.  She does endorse pins-and-needles feeling in her feet left greater than right.  Based on EMR review, she does have history of sciatica.  Patient states that she is getting corticosteroid injection in her hip next week.  History of low back pain.  She also complains of some hindfoot pain and relates bilateral hammertoes with crossover deformity developing to the left foot though this does not currently hurt her..  Past Medical History:  Diagnosis Date   ABDOMINAL PAIN RIGHT LOWER QUADRANT 03/03/2010   Qualifier: Diagnosis of  By: Obie MD, Princella HERO    Acquired trigger finger 04/20/2016   Age-related osteoporosis without current pathological fracture 01/23/2017   Allergy    Anxiety    Arthritis    Asthma    Bilateral carotid artery stenosis 01/16/2020   Bilateral primary osteoarthritis of knee 10/16/2018   Cataract    bilateral and removed   Change in bowel function 11/07/2022   Chest pain 11/01/2013   Closed fracture of distal end of radius 06/05/2016   Closed fracture of nasal bones 04/26/2023   Last Assessment & Plan: Formatting of this note might be different from the original. Nasal trauma. See history of present illness.  Denies nasal obstruction or concern over the appearance of the nose.  CT is reviewed and shows minimally displaced right nasal bone fracture. EXAM shows nasal dorsum in the midline.  Still little edema of the nasal dorsum.  Intranasal exam shows midline nasal septum.   DDD (degenerative disc disease), lumbar 01/23/2017   DECREASED HEARING 05/02/2008   Qualifier: Diagnosis of   By: Marcelo CMA (AAMA), Dottie       Replacing diagnoses that were inactivated after the 02/27/23 regulatory import   Depression    Diabetes mellitus type II, non insulin  dependent (HCC) 07/05/2019   DM 03/03/2010   Qualifier: Diagnosis of  By: Claudene CMA, Burnard     DM (diabetes mellitus) (HCC)    DOE (dyspnea on exertion) 05/06/2019   Dyslipidemia 01/23/2017   Dyspnea    Esophageal reflux    ESOPHAGITIS 05/02/2008   Qualifier: Diagnosis of   By: Marcelo CMA (AAMA), Dottie      Replacing diagnoses that were inactivated after the 02/27/23 regulatory import   Essential hypertension 05/02/2008   Qualifier: Diagnosis of  By: Nelson-Smith CMA (AAMA), Dottie     Full incontinence of feces 03/03/2010   Qualifier: Diagnosis of  By: Obie MD, Princella HERO    H/O adenomatous polyp of colon 2011   H/O allergic rhinitis 07/29/2015   Hearing loss, mixed, bilateral 12/17/2018   Heart murmur    no cardiologist, family Dr. ardelle   HIATAL HERNIA 05/02/2008   Qualifier: Diagnosis of   By: Marcelo CMA (AAMA), Dottie      Replacing diagnoses that were inactivated after the 02/27/23 regulatory import   History of asthma 01/23/2017   History of depression 01/23/2017   History of diabetes mellitus 01/23/2017   History of gastroesophageal reflux (GERD) 01/17/2017   History of kidney stones    History of transient ischemic attack (TIA) 01/16/2020  Hyperlipidemia    Irritable bowel syndrome    Kidney stones    LPRD (laryngopharyngeal reflux disease) 07/29/2015   Mallory-Weiss tear 07/03/2022   Mild aortic stenosis 07/05/2019   Mitral regurgitation 07/05/2019   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 Here for medical clearance for the right ear. Noted to have recent worsening of her right ear on audiogram at an outside facility.  Denies any pain.  In general, she is functioning well  with her current hearing aid though she is looking at getting a new one.  Left ear with profound loss from prior surgery.  Audio   Murmur 11/01/2013   Normocytic anemia 01/16/2020   Obstructive lung disease (HCC) 07/29/2015   OSA (obstructive sleep apnea) 09/28/2018   Pain in left wrist 06/06/2016   Pedal edema 05/06/2019   Primary osteoarthritis of both feet 01/17/2017   Primary osteoarthritis of both knees 01/17/2017   Primary osteoarthritis, left shoulder 12/11/2017   RECTAL INCONTINENCE 05/02/2008   Qualifier: Diagnosis of   By: Marcelo CMA (AAMA), Dottie      Replacing diagnoses that were inactivated after the 02/27/23 regulatory import   Retained orthopedic hardware 11/14/2016   Rheumatoid factor positive 12/13/2016   Sarcoidosis    Severe persistent asthma 11/16/2007   Annotation: chronic Qualifier: Diagnosis of  By: Darlean MD, Ozell NOVAK    Shoulder arthritis 02/22/2018   Sleep apnea    no cpap now   Status post dilation of esophageal narrowing    Stiffness of left wrist joint 06/06/2016   Stroke (HCC)    Vitamin D  deficiency 01/17/2017    Past Surgical History:  Procedure Laterality Date   BLADDER SURGERY     CATARACT EXTRACTION Bilateral    HARDWARE REMOVAL Left 01/12/2017   Procedure: HARDWARE REMOVAL left wrist;  Surgeon: Franky Curia, MD;  Location: [facility];  Service: Orthopedics;  Laterality: Left;   HEMORRHOID SURGERY     NASAL SEPTUM SURGERY     PARTIAL HYSTERECTOMY     REVERSE SHOULDER ARTHROPLASTY Left 02/22/2018   REVERSE SHOULDER ARTHROPLASTY Left 02/22/2018   Procedure: LEFT REVERSE SHOULDER ARTHROPLASTY;  Surgeon: Addie Cordella Hamilton, MD;  Location: [facility];  Service: Orthopedics;  Laterality: Left;   TONSILLECTOMY AND ADENOIDECTOMY     TRANSCAROTID ARTERY REVASCULARIZATION  Left 01/17/2020   Procedure: TRANSCAROTID ARTERY REVASCULARIZATION;  Surgeon: Serene Gaile ORN, MD;  Location: [facility] OR;  Service: Vascular;  Laterality: Left;   TRIGGER  FINGER RELEASE Left 05/12/2016   Procedure: RELEASE TRIGGER FINGER/A-1 PULLEY INFECTION LEFT RING TENDON SHEATH INJECT RIGHT INDEX FINGER;  Surgeon: Franky Curia, MD;  Location: [facility];  Service: Orthopedics;  Laterality: Left;   TUBAL LIGATION      Allergies  Allergen Reactions   Codeine Nausea And Vomiting   Sulfa Antibiotics Other (See Comments)    Allergic per pt's mother     ROS denies any nausea, vomiting, fever, chills, chest pain, shortness of breath.   Physical Exam: There were no vitals filed for this visit.  General: The patient is alert and oriented x3 in no acute distress.  Dermatology: Skin is warm, dry and supple bilateral lower extremities. Interspaces are clear of maceration and debris.  Nail plates x 10 notable for increased thickness, increased length, dystrophic appearance with yellow discoloration and subungual debris consistent with mycotic infection.  Pain on direct dorsal palpation of the nail plates.  Vascular: Palpable pedal pulses bilaterally. Capillary refill within normal limits.  Pedal hair growth noted.  No appreciable edema.  No erythema or calor.  Telangiectasias appreciated.  Neurological: Light touch sensation grossly intact bilateral feet.  Protective sensation intact.  Vibratory sensation diminished.  Musculoskeletal Exam: Muscle strength 5/5 in dorsiflexion, eversion, inversion, eversion.  Left foot greater than right foot there are bilateral hallux limitus deformities with hammertoe contractures.  Developing crossover deformity left first and second toes.  Tenderness on palpation of sinus tarsi.  Tenderness with resisted inversion and eversion of subtalar joint.  Mild localized edema here  Radiographic Exam: Left foot 12/12/2023 Normal osseous mineralization. Joint spaces preserved.  No fractures or osseous irregularities noted.  Pes planus foot type.  Some mild subchondral sclerosis of subtalar joint.  Squaring of first metatarsal  head noted without increase of IM angle.  Hammertoe contractures of lesser digits.  Assessment/Plan of Care: 1. Pain due to onychomycosis of toenails of both feet   2. Diabetes mellitus type II, non insulin  dependent (HCC)   3. Capsulitis of left foot   4. Acquired hammertoes of both feet   5. Sinus tarsi syndrome of left ankle      No orders of the defined types were placed in this encounter.  None  Discussed clinical findings with patient today.  # Pain due to onychomycosis -Affected nail plates x 10 were debrided in thickness and length using sterile nail nippers without incident - Mechanical bur used to file nails thin  # Diabetes - Patient educated on diabetes. Discussed proper diabetic foot care and discussed risks and complications of disease. Educated patient in depth on reasons to return to the office immediately should he/she discover anything concerning or new on the feet. All questions answered. Discussed proper shoes as well.   # Left foot capsulitis # Sinus tarsi syndrome left foot -Discussed corticosteroid injection with patient, we will defer this due to upcoming injection she is receiving for her hip early next week.  Did recommend proceeding with this in the future at follow-up if she does not experience any relief with immobilization -RICE therapy - ASO ankle brace dispensed to the patient same applied to the left foot - Discussed importance of good stiff soled shoes. -She is currently taking oral diclofenac for her knees and hips occasionally, advised that she can take this more frequently over the next couple weeks for her foot as well  # Hammertoe with crossover deformity - Does not currently cause significant pain to the patient - Hammertoe crest pads dispensed.  Follow-up in 1 month for reevaluation of the sinus tarsi pain, consider injection at this point.   Symone Cornman L. Lamount MAUL, AACFAS [facility]     [address] [place], [place] [address]                Office [phone]  Fax [phone]

## 2023-12-13 ENCOUNTER — Encounter: Payer: Self-pay | Admitting: Podiatry

## 2023-12-18 ENCOUNTER — Ambulatory Visit: Payer: Medicare Other | Admitting: Physical Medicine and Rehabilitation

## 2023-12-18 ENCOUNTER — Other Ambulatory Visit: Payer: Self-pay

## 2023-12-18 DIAGNOSIS — M25552 Pain in left hip: Secondary | ICD-10-CM | POA: Diagnosis not present

## 2023-12-18 NOTE — Progress Notes (Signed)
[name] - 78 y.o. female MRN [id number]  Date of birth: August 05, 1946  Office Visit Note: Visit Date: 12/18/2023 PCP: Buckner Malta, MD Referred by: Buckner Malta, MD  Subjective: Chief Complaint  Patient presents with   Left Hip - Pain   HPI:  [name] is a 78 y.o. female who comes in today at the request of Ellin Goodie, FNP for planned Left anesthetic hip arthrogram with fluoroscopic guidance.  The patient has failed conservative care including home exercise, medications, time and activity modification.  This injection will be diagnostic and hopefully therapeutic.  Please see requesting physician notes for further details and justification.  If the injection diagnostically helps her pain but the symptoms return I would have her follow-up with Dr. Burnard Bunting.   ROS Otherwise per HPI.  Assessment & Plan: Visit Diagnoses:    ICD-10-CM   1. Pain in left hip  M25.552 XR C-ARM NO REPORT    Large Joint Inj: L hip joint      Plan: No additional findings.   Meds & Orders: No orders of the defined types were placed in this encounter.   Orders Placed This Encounter  Procedures   Large Joint Inj: L hip joint   XR C-ARM NO REPORT    Follow-up: Return if symptoms worsen or fail to improve.   Procedures: Large Joint Inj: L hip joint on 12/18/2023 10:43 AM Indications: diagnostic evaluation and pain Details: 22 G 3.5 in needle, fluoroscopy-guided anterior approach  Arthrogram: No  Medications: 4 mL bupivacaine 0.25 %; 40 mg triamcinolone acetonide 40 MG/ML Outcome: tolerated well, no immediate complications  There was excellent flow of contrast producing a partial arthrogram of the hip. The patient did have relief of symptoms during the anesthetic phase of the injection. Procedure, treatment alternatives, risks and benefits explained, specific risks discussed. Consent was given by the patient. Immediately prior to procedure a time out was called to verify  the correct patient, procedure, equipment, support staff and site/side marked as required. Patient was prepped and draped in the usual sterile fashion.          Clinical History: CLINICAL DATA:  78 year old female with low back pain radiating down the leg.   EXAM: MRI LUMBAR SPINE WITHOUT CONTRAST   TECHNIQUE: Multiplanar, multisequence MR imaging of the lumbar spine was performed. No intravenous contrast was administered.   COMPARISON:  Lumbar radiographs 02/08/2023.  Lumbar MRI 04/09/2020.   FINDINGS: Segmentation: Normal on the comparison radiographs, the same numbering system used on the previous MRI.   Alignment: Chronic straightening of lumbar lordosis appears stable since 2021. Mild dextroconvex upper and levoconvex mid to lower lumbar scoliosis as noted on the comparison radiographs (series 14 today). There is subtle degenerative anterolisthesis of L3 on L4. See additional details of that level below.   Vertebrae: Normal background bone marrow signal and maintained vertebral height. Degenerative endplate marrow signal changes at L3-L4 including mild vertebral marrow edema eccentric to the right (series 107, image 10).   No other marrow edema or acute osseous abnormality. Intact visible sacrum and SI joints.   Conus medullaris and cauda equina: Conus extends to the L1-L2 level. No lower spinal cord or conus signal abnormality. Generally normal cauda equina nerve roots.   Paraspinal and other soft tissues: Negative.   Disc levels:   Visible lower thoracic levels are normal for age through T12-L1.   L1-L2: Mild disc bulging asymmetric to the left. Borderline to mild left facet hypertrophy. No  stenosis.   L2-L3: Similar mild asymmetric disc bulging to the left. Mild left facet and mild ligament flavum hypertrophy. No stenosis.   L3-L4: Subtle anterolisthesis. Disc space loss since 2021. Asymmetric disc bulging, bulky on the right side. Progressed and up to  moderate right side facet and ligament flavum hypertrophy. No significant spinal stenosis. Borderline to mild right lateral recess stenosis (right L4 nerve level). And moderate progressed right L3 neural foraminal stenosis (series 105, image 7).   L4-L5: Subtle anterolisthesis also at this level. But only minor disc bulging. Mild facet and ligament flavum hypertrophy. No stenosis.   L5-S1:  Negative.   IMPRESSION: 1. Mild lumbar scoliosis. Progressed L3-L4 disc, endplate, and posterior element degeneration since 2021 with subtle anterolisthesis there. Increased and moderate right neural foraminal stenosis, mild right lateral recess stenosis. Query Right L3 and/or L4 radiculitis. 2. Otherwise mild for age lumbar spine degeneration. No spinal stenosis or other neural impingement.     Electronically Signed   By: Odessa Fleming M.D.   On: 10/30/2023 11:23     Objective:  VS:  HT:    WT:   BMI:     BP:   HR: bpm  TEMP: ( )  RESP:  Physical Exam   Imaging: No results found.

## 2023-12-18 NOTE — Progress Notes (Signed)
Functional Pain Scale - descriptive words and definitions  Mild (2)   Noticeable when not distracted/no impact on ADL's/sleep only slightly affected and able to   use both passive and active distraction for comfort. Mild range order  Average Pain 7 She stated she is hurting today.   +Driver, -BT, -Dye Allergies.

## 2023-12-21 ENCOUNTER — Encounter (HOSPITAL_COMMUNITY): Payer: Self-pay | Admitting: Internal Medicine

## 2023-12-21 DIAGNOSIS — J4550 Severe persistent asthma, uncomplicated: Secondary | ICD-10-CM | POA: Diagnosis not present

## 2023-12-21 DIAGNOSIS — N3001 Acute cystitis with hematuria: Secondary | ICD-10-CM | POA: Diagnosis not present

## 2023-12-21 DIAGNOSIS — D692 Other nonthrombocytopenic purpura: Secondary | ICD-10-CM | POA: Diagnosis not present

## 2023-12-21 DIAGNOSIS — R300 Dysuria: Secondary | ICD-10-CM | POA: Diagnosis not present

## 2023-12-21 DIAGNOSIS — D860 Sarcoidosis of lung: Secondary | ICD-10-CM | POA: Diagnosis not present

## 2023-12-21 NOTE — Progress Notes (Signed)
Attempted to obtain medical history for pre op call via telephone, unable to reach at this time. HIPAA compliant voicemail message left requesting return call to pre surgical testing department.

## 2023-12-25 ENCOUNTER — Telehealth: Payer: Self-pay | Admitting: Pharmacy Technician

## 2023-12-25 DIAGNOSIS — Z5986 Financial insecurity: Secondary | ICD-10-CM

## 2023-12-25 NOTE — Progress Notes (Addendum)
Pharmacy Medication Assistance Program Note    12/25/2023  Patient ID: [name], female   DOB: 02-May-1946, 78 y.o.   MRN: [id number]     12/25/2023  Outreach [employer]ication One  Initial Outreach Date ([employer]ication One) 12/20/2023  Manufacturer Medication One [employer] [facility] Ozempic  Dose of Ozempic 4mg /67ml  Type of Radiographer, therapeutic Assistance  Date Application Sent to Patient 12/27/2023  Application Items Requested Application;Proof of Income;Other  Date Application Sent to Prescriber 12/27/2023  Name of Prescriber Buckner Malta   [facility] 01/12/24 Successful outreach to patient. HIPAA verified. Patient informs she received the [employer] application for [employer] but has not returned it at this time. She will work on completing and returning the application.  [name], CPhT [place]  Office: [phone] Fax: [phone] Email: [email or web address]

## 2023-12-26 ENCOUNTER — Ambulatory Visit: Payer: Medicare HMO | Admitting: Podiatry

## 2023-12-27 MED ORDER — TRIAMCINOLONE ACETONIDE 40 MG/ML IJ SUSP
40.0000 mg | INTRAMUSCULAR | Status: AC | PRN
  Administered 2023-12-18: 40 mg via INTRA_ARTICULAR

## 2023-12-27 MED ORDER — BUPIVACAINE HCL 0.25 % IJ SOLN
4.0000 mL | INTRAMUSCULAR | Status: AC | PRN
  Administered 2023-12-18: 4 mL via INTRA_ARTICULAR

## 2023-12-27 NOTE — Anesthesia Preprocedure Evaluation (Signed)
Anesthesia Evaluation  Patient identified by MRN, date of birth, ID band Patient awake    Reviewed: Allergy & Precautions, NPO status , Patient's Chart, lab work & pertinent test results  Airway Mallampati: IV  TM Distance: >3 FB Neck ROM: Full    Dental  (+) Teeth Intact, Dental Advisory Given   Pulmonary asthma (used inhalers this AM) , sleep apnea (noncompliant w/ cpap)  Sarcoidosis, severe asthma   Pulmonary exam normal breath sounds clear to auscultation       Cardiovascular hypertension (147/81 preop), Pt. on medications Normal cardiovascular exam+ Valvular Problems/Murmurs (mild-mod MR, mild AS) MR and AS  Rhythm:Regular Rate:Normal  Echo 2020 1. The left ventricle has normal systolic function with an ejection  fraction of 60-65%. The cavity size was normal. Left ventricular diastolic  Doppler parameters are consistent with pseudonormalization.   2. The right ventricle has normal systolic function. The cavity was  normal. There is no increase in right ventricular wall thickness.   3. The mitral valve is degenerative. Mild thickening of the mitral valve  leaflet. Mild calcification of the mitral valve leaflet. Mitral valve  regurgitation is mild to moderate by color flow Doppler.   4. The tricuspid valve is grossly normal.   5. The aortic valve is abnormal. Mild calcification of the aortic valve.  Aortic valve regurgitation is mild by color flow Doppler. Mild stenosis of  the aortic valve.   6. The aorta is normal in size and structure.   7. The inferior vena cava was dilated in size with >50% respiratory  variability.     Neuro/Psych  PSYCHIATRIC DISORDERS Anxiety Depression    CVA, No Residual Symptoms    GI/Hepatic Neg liver ROS,GERD  Medicated and Controlled,,  Endo/Other  diabetes, Well Controlled, Type 2    Renal/GU negative Renal ROS  negative genitourinary   Musculoskeletal  (+) Arthritis ,  Osteoarthritis,    Abdominal   Peds  Hematology negative hematology ROS (+)   Anesthesia Other Findings Very HOH   Ozempic LD 7d ago  Reproductive/Obstetrics negative OB ROS                             Anesthesia Physical Anesthesia Plan  ASA: 3  Anesthesia Plan: MAC   Post-op Pain Management:    Induction:   PONV Risk Score and Plan: 2 and Propofol infusion and TIVA  Airway Management Planned: Natural Airway and Simple Face Mask  Additional Equipment: None  Intra-op Plan:   Post-operative Plan:   Informed Consent: I have reviewed the patients History and Physical, chart, labs and discussed the procedure including the risks, benefits and alternatives for the proposed anesthesia with the patient or authorized representative who has indicated his/her understanding and acceptance.       Plan Discussed with: CRNA  Anesthesia Plan Comments:         Anesthesia Quick Evaluation

## 2023-12-28 ENCOUNTER — Encounter (HOSPITAL_COMMUNITY)
Admission: RE | Disposition: A | Discharge status: Home / Self Care | Payer: Self-pay | Source: Home / Self Care | Attending: Internal Medicine

## 2023-12-28 ENCOUNTER — Other Ambulatory Visit: Payer: Self-pay

## 2023-12-28 ENCOUNTER — Ambulatory Visit (HOSPITAL_COMMUNITY): Payer: Self-pay | Admitting: Anesthesiology

## 2023-12-28 ENCOUNTER — Ambulatory Visit (HOSPITAL_BASED_OUTPATIENT_CLINIC_OR_DEPARTMENT_OTHER): Payer: Medicare Other | Admitting: Anesthesiology

## 2023-12-28 ENCOUNTER — Ambulatory Visit (HOSPITAL_COMMUNITY)
Admission: RE | Admit: 2023-12-28 | Discharge: 2023-12-28 | Disposition: A | Discharge status: Home / Self Care | Payer: Medicare Other | Attending: Internal Medicine | Admitting: Internal Medicine

## 2023-12-28 DIAGNOSIS — Z91198 Patient's noncompliance with other medical treatment and regimen for other reason: Secondary | ICD-10-CM | POA: Insufficient documentation

## 2023-12-28 DIAGNOSIS — F419 Anxiety disorder, unspecified: Secondary | ICD-10-CM | POA: Insufficient documentation

## 2023-12-28 DIAGNOSIS — K219 Gastro-esophageal reflux disease without esophagitis: Secondary | ICD-10-CM | POA: Insufficient documentation

## 2023-12-28 DIAGNOSIS — J4550 Severe persistent asthma, uncomplicated: Secondary | ICD-10-CM | POA: Diagnosis not present

## 2023-12-28 DIAGNOSIS — Z79899 Other long term (current) drug therapy: Secondary | ICD-10-CM | POA: Insufficient documentation

## 2023-12-28 DIAGNOSIS — D869 Sarcoidosis, unspecified: Secondary | ICD-10-CM | POA: Insufficient documentation

## 2023-12-28 DIAGNOSIS — K2970 Gastritis, unspecified, without bleeding: Secondary | ICD-10-CM | POA: Diagnosis not present

## 2023-12-28 DIAGNOSIS — M1990 Unspecified osteoarthritis, unspecified site: Secondary | ICD-10-CM | POA: Diagnosis not present

## 2023-12-28 DIAGNOSIS — K449 Diaphragmatic hernia without obstruction or gangrene: Secondary | ICD-10-CM | POA: Insufficient documentation

## 2023-12-28 DIAGNOSIS — R194 Change in bowel habit: Secondary | ICD-10-CM | POA: Diagnosis not present

## 2023-12-28 DIAGNOSIS — G4733 Obstructive sleep apnea (adult) (pediatric): Secondary | ICD-10-CM | POA: Diagnosis not present

## 2023-12-28 DIAGNOSIS — D509 Iron deficiency anemia, unspecified: Secondary | ICD-10-CM

## 2023-12-28 DIAGNOSIS — K3189 Other diseases of stomach and duodenum: Secondary | ICD-10-CM

## 2023-12-28 DIAGNOSIS — J45909 Unspecified asthma, uncomplicated: Secondary | ICD-10-CM | POA: Diagnosis not present

## 2023-12-28 DIAGNOSIS — Z8673 Personal history of transient ischemic attack (TIA), and cerebral infarction without residual deficits: Secondary | ICD-10-CM | POA: Diagnosis not present

## 2023-12-28 DIAGNOSIS — D120 Benign neoplasm of cecum: Secondary | ICD-10-CM | POA: Diagnosis not present

## 2023-12-28 DIAGNOSIS — K5730 Diverticulosis of large intestine without perforation or abscess without bleeding: Secondary | ICD-10-CM | POA: Insufficient documentation

## 2023-12-28 DIAGNOSIS — D123 Benign neoplasm of transverse colon: Secondary | ICD-10-CM | POA: Insufficient documentation

## 2023-12-28 DIAGNOSIS — K319 Disease of stomach and duodenum, unspecified: Secondary | ICD-10-CM | POA: Diagnosis not present

## 2023-12-28 DIAGNOSIS — K648 Other hemorrhoids: Secondary | ICD-10-CM | POA: Insufficient documentation

## 2023-12-28 DIAGNOSIS — B3781 Candidal esophagitis: Secondary | ICD-10-CM

## 2023-12-28 DIAGNOSIS — K222 Esophageal obstruction: Secondary | ICD-10-CM | POA: Insufficient documentation

## 2023-12-28 DIAGNOSIS — I10 Essential (primary) hypertension: Secondary | ICD-10-CM | POA: Insufficient documentation

## 2023-12-28 DIAGNOSIS — K5900 Constipation, unspecified: Secondary | ICD-10-CM

## 2023-12-28 DIAGNOSIS — E119 Type 2 diabetes mellitus without complications: Secondary | ICD-10-CM | POA: Diagnosis not present

## 2023-12-28 DIAGNOSIS — B49 Unspecified mycosis: Secondary | ICD-10-CM | POA: Insufficient documentation

## 2023-12-28 DIAGNOSIS — F32A Depression, unspecified: Secondary | ICD-10-CM | POA: Insufficient documentation

## 2023-12-28 DIAGNOSIS — K589 Irritable bowel syndrome without diarrhea: Secondary | ICD-10-CM

## 2023-12-28 HISTORY — PX: POLYPECTOMY: SHX5525

## 2023-12-28 HISTORY — PX: COLONOSCOPY WITH PROPOFOL: SHX5780

## 2023-12-28 HISTORY — DX: Iron deficiency anemia, unspecified: D50.9

## 2023-12-28 HISTORY — PX: ESOPHAGOGASTRODUODENOSCOPY (EGD) WITH PROPOFOL: SHX5813

## 2023-12-28 HISTORY — PX: BIOPSY: SHX5522

## 2023-12-28 LAB — GLUCOSE, CAPILLARY: Glucose-Capillary: 95 mg/dL (ref 70–99)

## 2023-12-28 SURGERY — COLONOSCOPY WITH PROPOFOL
Anesthesia: Monitor Anesthesia Care

## 2023-12-28 MED ORDER — PROPOFOL 500 MG/50ML IV EMUL
INTRAVENOUS | Status: DC | PRN
  Administered 2023-12-28: 87 ug/kg/min via INTRAVENOUS

## 2023-12-28 MED ORDER — LACTATED RINGERS IV SOLN: INTRAVENOUS | Status: DC | PRN

## 2023-12-28 MED ORDER — PROPOFOL 1000 MG/100ML IV EMUL
INTRAVENOUS | Status: AC
Start: 2023-12-28 — End: ?
  Filled 2023-12-28: qty 100

## 2023-12-28 MED ORDER — OZEMPIC (0.25 OR 0.5 MG/DOSE) 2 MG/1.5ML ~~LOC~~ SOPN: 0.2500 mg | PEN_INJECTOR | SUBCUTANEOUS | Status: AC

## 2023-12-28 MED ORDER — SODIUM CHLORIDE 0.9 % IV SOLN: INTRAVENOUS | Status: DC

## 2023-12-28 MED ORDER — LIDOCAINE HCL (PF) 2 % IJ SOLN
INTRAMUSCULAR | Status: DC | PRN
  Administered 2023-12-28: 80 mg via INTRADERMAL

## 2023-12-28 MED ORDER — GLYCOPYRROLATE 0.2 MG/ML IJ SOLN
INTRAMUSCULAR | Status: DC | PRN
  Administered 2023-12-28: .1 mg via INTRAVENOUS

## 2023-12-28 SURGICAL SUPPLY — 21 items
ELECT REM PT RETURN 9FT ADLT (ELECTROSURGICAL)
ELECTRODE REM PT RTRN 9FT ADLT (ELECTROSURGICAL) IMPLANT
FCP BXJMBJMB 240X2.8X (CUTTING FORCEPS)
FLOOR PAD 36X40 (MISCELLANEOUS) ×2
FORCEPS BIOP RAD 4 LRG CAP 4 (CUTTING FORCEPS) IMPLANT
FORCEPS BIOP RJ4 240 W/NDL (CUTTING FORCEPS)
FORCEPS BXJMBJMB 240X2.8X (CUTTING FORCEPS) IMPLANT
INJECTOR/SNARE I SNARE (MISCELLANEOUS) IMPLANT
LUBRICANT JELLY 4.5OZ STERILE (MISCELLANEOUS) IMPLANT
MANIFOLD NEPTUNE II (INSTRUMENTS) IMPLANT
NDL SCLEROTHERAPY 25GX240 (NEEDLE) IMPLANT
NEEDLE SCLEROTHERAPY 25GX240 (NEEDLE)
PAD FLOOR 36X40 (MISCELLANEOUS) ×3 IMPLANT
PROBE APC STR FIRE (PROBE) IMPLANT
PROBE INJECTION GOLD 7FR (MISCELLANEOUS) IMPLANT
SNARE ROTATE MED OVAL 20MM (MISCELLANEOUS) IMPLANT
SYR 50ML LL SCALE MARK (SYRINGE) IMPLANT
TRAP SPECIMEN MUCOUS 40CC (MISCELLANEOUS) IMPLANT
TUBING ENDO SMARTCAP PENTAX (MISCELLANEOUS) IMPLANT
TUBING IRRIGATION ENDOGATOR (MISCELLANEOUS) ×3 IMPLANT
WATER STERILE IRR 1000ML POUR (IV SOLUTION) IMPLANT

## 2023-12-28 NOTE — H&P (Signed)
GASTROENTEROLOGY PROCEDURE H&P NOTE   Primary Care Physician: Buckner Malta, MD    Reason for Procedure:   Change in bowel habits, iron deficiency with recent anemia  Plan:    EGD/colonoscopy  Patient is appropriate for endoscopic procedure(s) in the hospital setting.  The nature of the procedure, as well as the risks, benefits, and alternatives were carefully and thoroughly reviewed with the patient. Ample time for discussion and questions allowed. The patient understood, was satisfied, and agreed to proceed.     HPI: [name] is a 78 y.o. female who presents for colonoscopy for evaluation of change in bowel habits, iron deficiency with recent anemia.  Patient was most recently seen in the Gastroenterology Clinic on 10/16/23.  No interval change in medical history since that appointment. Please refer to that note for full details regarding GI history and clinical presentation.   Past Medical History:  Diagnosis Date   ABDOMINAL PAIN RIGHT LOWER QUADRANT 03/03/2010   Qualifier: Diagnosis of  By: Juanda Chance MD, Hedwig Morton    Acquired trigger finger 04/20/2016   Age-related osteoporosis without current pathological fracture 01/23/2017   Allergy    Anxiety    Arthritis    Asthma    Bilateral carotid artery stenosis 01/16/2020   Bilateral primary osteoarthritis of knee 10/16/2018   Cataract    bilateral and removed   Change in bowel function 11/07/2022   Chest pain 11/01/2013   Closed fracture of distal end of radius 06/05/2016   Closed fracture of nasal bones 04/26/2023   Last Assessment & Plan: Formatting of this note might be different from the original. Nasal trauma. See history of present illness.  Denies nasal obstruction or concern over the appearance of the nose.  CT is reviewed and shows minimally displaced right nasal bone fracture. EXAM shows nasal dorsum in the midline.  Still little edema of the nasal dorsum.  Intranasal exam shows midline nasal septum.   DDD  (degenerative disc disease), lumbar 01/23/2017   DECREASED HEARING 05/02/2008   Qualifier: Diagnosis of   By: Candice Camp CMA (AAMA), Dottie      Replacing diagnoses that were inactivated after the 02/27/23 regulatory import   Depression    Diabetes mellitus type II, non insulin dependent (HCC) 07/05/2019   DM 03/03/2010   Qualifier: Diagnosis of  By: Katrinka Blazing CMA, Tresa Endo     DM (diabetes mellitus) (HCC)    DOE (dyspnea on exertion) 05/06/2019   Dyslipidemia 01/23/2017   Dyspnea    Esophageal reflux    ESOPHAGITIS 05/02/2008   Qualifier: Diagnosis of   By: Candice Camp CMA (AAMA), Dottie      Replacing diagnoses that were inactivated after the 02/27/23 regulatory import   Essential hypertension 05/02/2008   Qualifier: Diagnosis of  By: Nelson-Smith CMA (AAMA), Dottie     Full incontinence of feces 03/03/2010   Qualifier: Diagnosis of  By: Juanda Chance MD, Hedwig Morton    H/O adenomatous polyp of colon 2011   H/O allergic rhinitis 07/29/2015   Hearing loss, mixed, bilateral 12/17/2018   Heart murmur    no cardiologist, family Dr. Zollie Scale   HIATAL HERNIA 05/02/2008   Qualifier: Diagnosis of   By: Candice Camp CMA (AAMA), Dottie      Replacing diagnoses that were inactivated after the 02/27/23 regulatory import   History of asthma 01/23/2017   History of depression 01/23/2017   History of diabetes mellitus 01/23/2017   History of gastroesophageal reflux (GERD) 01/17/2017   History of kidney stones  History of transient ischemic attack (TIA) 01/16/2020   Hyperlipidemia    Irritable bowel syndrome    Kidney stones    LPRD (laryngopharyngeal reflux disease) 07/29/2015   Mallory-Weiss tear 07/03/2022   Mild aortic stenosis 07/05/2019   Mitral regurgitation 07/05/2019   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 Here for medical clearance for the  right ear. Noted to have recent worsening of her right ear on audiogram at an outside facility.  Denies any pain.  In general, she is functioning well with her current hearing aid though she is looking at getting a new one.  Left ear with profound loss from prior surgery.  Audio   Murmur 11/01/2013   Normocytic anemia 01/16/2020   Obstructive lung disease (HCC) 07/29/2015   OSA (obstructive sleep apnea) 09/28/2018   Pain in left wrist 06/06/2016   Pedal edema 05/06/2019   Primary osteoarthritis of both feet 01/17/2017   Primary osteoarthritis of both knees 01/17/2017   Primary osteoarthritis, left shoulder 12/11/2017   RECTAL INCONTINENCE 05/02/2008   Qualifier: Diagnosis of   By: Candice Camp CMA (AAMA), Dottie      Replacing diagnoses that were inactivated after the 02/27/23 regulatory import   Retained orthopedic hardware 11/14/2016   Rheumatoid factor positive 12/13/2016   Sarcoidosis    Severe persistent asthma 11/16/2007   Annotation: chronic Qualifier: Diagnosis of  By: Sherene Sires MD, [name]    Shoulder arthritis 02/22/2018   Sleep apnea    no cpap now   Status post dilation of esophageal narrowing    Stiffness of left wrist joint 06/06/2016   Stroke (HCC)    Vitamin D deficiency 01/17/2017    Past Surgical History:  Procedure Laterality Date   BLADDER SURGERY     CATARACT EXTRACTION Bilateral    HARDWARE REMOVAL Left 01/12/2017   Procedure: HARDWARE REMOVAL left wrist;  Surgeon: Betha Loa, MD;  Location: [facility];  Service: Orthopedics;  Laterality: Left;   HEMORRHOID SURGERY     NASAL SEPTUM SURGERY     PARTIAL HYSTERECTOMY     REVERSE SHOULDER ARTHROPLASTY Left 02/22/2018   REVERSE SHOULDER ARTHROPLASTY Left 02/22/2018   Procedure: LEFT REVERSE SHOULDER ARTHROPLASTY;  Surgeon: Cammy Copa, MD;  [facility]: [facility] OR;  Service: Orthopedics;  Laterality: Left;   TONSILLECTOMY AND ADENOIDECTOMY     TRANSCAROTID ARTERY REVASCULARIZATION  Left 01/17/2020    Procedure: TRANSCAROTID ARTERY REVASCULARIZATION;  Surgeon: Nada Libman, MD;  Location: [facility] OR;  Service: Vascular;  Laterality: Left;   TRIGGER FINGER RELEASE Left 05/12/2016   Procedure: RELEASE TRIGGER FINGER/A-1 PULLEY INFECTION LEFT RING TENDON SHEATH INJECT RIGHT INDEX FINGER;  Surgeon: Betha Loa, MD;  Location: [facility];  Service: Orthopedics;  Laterality: Left;   TUBAL LIGATION      Prior to Admission medications   Medication Sig Start Date End Date Taking? Authorizing Provider  acetaminophen (TYLENOL) 325 MG tablet Take 2 tablets (650 mg total) by mouth every 6 (six) hours as needed for mild pain (or Fever >/= 101). 01/18/20   Zannie Cove, MD  albuterol (VENTOLIN HFA) 108 (90 Base) MCG/ACT inhaler Inhale 2 puffs into the lungs every 6 (six) hours as needed for wheezing or shortness of breath.    [provider]  alendronate (FOSAMAX) 70 MG tablet Take 70 mg by mouth once a week. 12/15/21  [provider]  aspirin 81 MG EC tablet Take 1 tablet by mouth daily.    [provider]  atorvastatin (LIPITOR) 40 MG tablet Take 40 mg by mouth at bedtime. 07/21/20   [provider]  citalopram (CELEXA) 20 MG tablet Take 20 mg by mouth daily.    [provider]  diclofenac (VOLTAREN) 25 MG EC tablet Take 25 mg by mouth daily. 05/01/23   [provider]  dicyclomine (BENTYL) 20 MG tablet Take 20 mg by mouth as needed for spasms.    [provider]  EPINEPHrine 0.3 mg/0.3 mL IJ SOAJ injection Use as directed for life-threatening allergic reaction. 10/28/20   Kozlow, Alvira Philips, MD  famotidine (PEPCID) 40 MG tablet TAKE 1 TABLET BY MOUTH EVERY DAY AT BEDTIME 09/25/23   Kozlow, Alvira Philips, MD  Ferrous Sulfate (IRON PO) Take 1 tablet by mouth daily.    [provider]  fluticasone (FLONASE) 50 MCG/ACT nasal spray Place 1-2 sprays into both nostrils as needed for allergies or rhinitis.    [provider]   fluticasone-salmeterol (ADVAIR) 250-50 MCG/ACT AEPB Inhale one puff twice daily to prevent cough or wheeze. Rinse mouth after use. 11/13/23   Kozlow, Alvira Philips, MD  gabapentin (NEURONTIN) 100 MG capsule Take 100 mg by mouth as needed (arthritis pain).    [provider]  ipratropium (ATROVENT) 0.06 % nasal spray INSTILL 1-2 SPRAYS IN EACH NOSTRIL EVERY 6 HOURS IF needed 05/03/23   Kozlow, Alvira Philips, MD  ipratropium-albuterol (DUONEB) 0.5-2.5 (3) MG/3ML SOLN Take 3 mLs by nebulization every 6 (six) hours as needed (shortness of breath/wheezing). 12/04/23   Kozlow, Alvira Philips, MD  levocetirizine (XYZAL) 5 MG tablet Take 5 mg by mouth at bedtime.  01/31/19   [provider]  metoprolol succinate (TOPROL-XL) 25 MG 24 hr tablet Take 25 mg by mouth at bedtime. 09/15/22   [provider]  montelukast (SINGULAIR) 10 MG tablet Take 10 mg by mouth daily. 02/20/19   [provider]  Na Sulfate-K Sulfate-Mg Sulf 17.5-3.13-1.6 GM/177ML SOLN Use as directed; may use generic; goodrx card if insurance will not cover generic 11/27/23   Imogene Burn, MD  omeprazole (PRILOSEC) 40 MG capsule Take 40 mg by mouth 2 (two) times daily. 11/10/22   [provider]  Probiotic Product (PROBIOTIC DAILY PO) Take 1 capsule by mouth daily.     [provider]  tamsulosin (FLOMAX) 0.4 MG CAPS capsule Take 0.4 mg by mouth daily. 04/22/23   [provider]  Telmisartan-amLODIPine 80-5 MG TABS Take 1 tablet by mouth daily. 09/15/22   [provider]  Tezepelumab-ekko (TEZSPIRE) 210 MG/1. SOAJ Inject 210 mg into the skin every 28 (twenty-eight) days. 10/23/23   Kozlow, Alvira Philips, MD  umeclidinium bromide (INCRUSE ELLIPTA) 62.5 MCG/ACT AEPB Inhale 1 puff into the lungs daily. 11/20/23   Kozlow, Alvira Philips, MD    Current Facility-Administered Medications  Medication Dose Route Frequency Provider Last Rate Last Admin   0.9 %  sodium chloride infusion   Intravenous Continuous Imogene Burn, MD        Allergies as of 11/30/2023 - Review Complete 11/14/2023  Allergen Reaction Noted   Codeine Nausea And Vomiting 05/20/2015   Sulfa antibiotics Other (See Comments) 05/20/2015    Family History  Problem Relation Age of Onset   Esophageal cancer Father    Diabetes Mother    Diabetes Maternal Grandmother    Colon cancer Neg Hx  Rectal cancer Neg Hx    Stomach cancer Neg Hx     Social History   Socioeconomic History   Marital status: Married    Spouse name: Not on file   Number of children: 2   Years of education: Not on file   Highest education level: Not on file  Occupational History   Occupation: retired  Tobacco Use   Smoking status: Never   Smokeless tobacco: Never  Vaping Use   Vaping status: Never Used  Substance and Sexual Activity   Alcohol use: No   Drug use: No   Sexual activity: Not on file  Other Topics Concern   Not on file  Social History Narrative   Daily caffeine use.    Social Drivers of Corporate investment banker Strain: Not on file  Food Insecurity: Low Risk  (11/21/2022)   [facility]   Hunger Vital Sign    Worried About Running Out of Food in the Last Year: Never true    Within the past 12 months, the food you bought just didn't last and you didn't have money to get more: Not on file  Transportation Needs: No Transportation Needs (11/21/2022)   [facility]   Transportation    In the past 12 months, has lack of reliable transportation kept you from medical appointments, meetings, work or from getting things needed for daily living? : No  Physical Activity: Not on file  Stress: Not on file  Social Connections: Not on file  Intimate Partner Violence: Low Risk  (11/21/2022)   [facility] visits prior to 01/28/2023., [facility] visits prior to 01/28/2023.   Safety    How often does anyone, including family  and friends, physically hurt you?: Never    How often does anyone, including family and friends, insult or talk down to you?: Never    How often does anyone, including family and friends, threaten you with harm?: Never    How often does anyone, including family and friends, scream or curse at you?: Never    Physical Exam: Vital signs in last 24 hours: There were no vitals taken for this visit. GEN: NAD EYE: Sclerae anicteric ENT: MMM CV: Non-tachycardic Pulm: No increased WOB GI: Soft NEURO:  Alert & Oriented   Eulah Pont, MD [facility]   12/28/2023 10:22 AM

## 2023-12-28 NOTE — Anesthesia Postprocedure Evaluation (Signed)
Anesthesia Post Note  Patient: ABIE KILLIAN  Procedure(s) Performed: COLONOSCOPY WITH PROPOFOL ESOPHAGOGASTRODUODENOSCOPY (EGD) WITH PROPOFOL BIOPSY POLYPECTOMY     Patient location during evaluation: PACU Anesthesia Type: MAC Level of consciousness: awake and alert Pain management: pain level controlled Vital Signs Assessment: post-procedure vital signs reviewed and stable Respiratory status: spontaneous breathing, nonlabored ventilation and respiratory function stable Cardiovascular status: blood pressure returned to baseline and stable Postop Assessment: no apparent nausea or vomiting Anesthetic complications: no   No notable events documented.  Last Vitals:  Vitals:   12/28/23 1148 12/28/23 1200  BP: (!) 119/59 (!) 169/57  Pulse: 66 64  Resp: 18 17  Temp: 36.6 C   SpO2: 100% 100%    Last Pain:  Vitals:   12/28/23 1200  TempSrc:   PainSc: 0-No pain                 Lannie Fields

## 2023-12-28 NOTE — Transfer of Care (Signed)
Immediate Anesthesia Transfer of Care Note  Patient: AARIANA SHANKLAND  Procedure(s) Performed: COLONOSCOPY WITH PROPOFOL ESOPHAGOGASTRODUODENOSCOPY (EGD) WITH PROPOFOL BIOPSY POLYPECTOMY  Patient Location: Endoscopy Unit  Anesthesia Type:MAC  Level of Consciousness: awake and alert   Airway & Oxygen Therapy: Patient Spontanous Breathing and Patient connected to nasal cannula oxygen  Post-op Assessment: Report given to RN and Post -op Vital signs reviewed and stable  Post vital signs: Reviewed and stable  Last Vitals:  Vitals Value Taken Time  BP 119/59 12/28/23 1148  Temp    Pulse 65 12/28/23 1150  Resp 14 12/28/23 1150  SpO2 100 % 12/28/23 1150  Vitals shown include unfiled device data.  Last Pain:  Vitals:   12/28/23 1031  TempSrc: Temporal  PainSc: 0-No pain         Complications: No notable events documented.

## 2023-12-28 NOTE — Discharge Instructions (Signed)
YOU HAD AN ENDOSCOPIC PROCEDURE TODAY: Refer to the procedure report and other information in the discharge instructions given to you for any specific questions about what was found during the examination. If this information does not answer your questions, please call [place] office at [phone] to clarify.   YOU SHOULD EXPECT: Some feelings of bloating in the abdomen. Passage of more gas than usual. Walking can help get rid of the air that was put into your GI tract during the procedure and reduce the bloating. If you had a lower endoscopy (such as a colonoscopy or flexible sigmoidoscopy) you may notice spotting of blood in your stool or on the toilet paper. Some abdominal soreness may be present for a day or two, also.  DIET: Your first meal following the procedure should be a light meal and then it is ok to progress to your normal diet. A half-sandwich or bowl of soup is an example of a good first meal. Heavy or fried foods are harder to digest and may make you feel nauseous or bloated. Drink plenty of fluids but you should avoid alcoholic beverages for 24 hours. If you had a esophageal dilation, please see attached instructions for diet.    ACTIVITY: Your care partner should take you home directly after the procedure. You should plan to take it easy, moving slowly for the rest of the day. You can resume normal activity the day after the procedure however YOU SHOULD NOT DRIVE, use power tools, machinery or perform tasks that involve climbing or major physical exertion for 24 hours (because of the sedation medicines used during the test).   SYMPTOMS TO REPORT IMMEDIATELY: A gastroenterologist can be reached at any hour. Please call [phone]  for any of the following symptoms:  Following lower endoscopy (colonoscopy, flexible sigmoidoscopy) Excessive amounts of blood in the stool  Significant tenderness, worsening of abdominal pains  Swelling of the abdomen that is new, acute  Fever of 100 or  higher  Following upper endoscopy (EGD, EUS, ERCP, esophageal dilation) Vomiting of blood or coffee ground material  New, significant abdominal pain  New, significant chest pain or pain under the shoulder blades  Painful or persistently difficult swallowing  New shortness of breath  Black, tarry-looking or red, bloody stools  FOLLOW UP:  If any biopsies were taken you will be contacted by phone or by letter within the next 1-3 weeks. Call [phone]  if you have not heard about the biopsies in 3 weeks.  Please also call with any specific questions about appointments or follow up tests.YOU HAD AN ENDOSCOPIC PROCEDURE TODAY: Refer to the procedure report and other information in the discharge instructions given to you for any specific questions about what was found during the examination. If this information does not answer your questions, please call [place] office at [phone] to clarify.   YOU SHOULD EXPECT: Some feelings of bloating in the abdomen. Passage of more gas than usual. Walking can help get rid of the air that was put into your GI tract during the procedure and reduce the bloating. If you had a lower endoscopy (such as a colonoscopy or flexible sigmoidoscopy) you may notice spotting of blood in your stool or on the toilet paper. Some abdominal soreness may be present for a day or two, also.  DIET: Your first meal following the procedure should be a light meal and then it is ok to progress to your normal diet. A half-sandwich or bowl of soup is an example of a  good first meal. Heavy or fried foods are harder to digest and may make you feel nauseous or bloated. Drink plenty of fluids but you should avoid alcoholic beverages for 24 hours. If you had a esophageal dilation, please see attached instructions for diet.    ACTIVITY: Your care partner should take you home directly after the procedure. You should plan to take it easy, moving slowly for the rest of the day. You can resume  normal activity the day after the procedure however YOU SHOULD NOT DRIVE, use power tools, machinery or perform tasks that involve climbing or major physical exertion for 24 hours (because of the sedation medicines used during the test).   SYMPTOMS TO REPORT IMMEDIATELY: A gastroenterologist can be reached at any hour. Please call [phone]  for any of the following symptoms:  Following lower endoscopy (colonoscopy, flexible sigmoidoscopy) Excessive amounts of blood in the stool  Significant tenderness, worsening of abdominal pains  Swelling of the abdomen that is new, acute  Fever of 100 or higher  Following upper endoscopy (EGD, EUS, ERCP, esophageal dilation) Vomiting of blood or coffee ground material  New, significant abdominal pain  New, significant chest pain or pain under the shoulder blades  Painful or persistently difficult swallowing  New shortness of breath  Black, tarry-looking or red, bloody stools  FOLLOW UP:  If any biopsies were taken you will be contacted by phone or by letter within the next 1-3 weeks. Call [phone]  if you have not heard about the biopsies in 3 weeks.  Please also call with any specific questions about appointments or follow up tests.

## 2023-12-29 ENCOUNTER — Ambulatory Visit (HOSPITAL_BASED_OUTPATIENT_CLINIC_OR_DEPARTMENT_OTHER)
Admission: EM | Admit: 2023-12-29 | Discharge: 2023-12-29 | Disposition: A | Discharge status: Home / Self Care | Payer: Medicare Other | Attending: Family Medicine | Admitting: Family Medicine

## 2023-12-29 ENCOUNTER — Encounter (HOSPITAL_COMMUNITY): Payer: Self-pay | Admitting: Internal Medicine

## 2023-12-29 ENCOUNTER — Encounter: Payer: Self-pay | Admitting: Internal Medicine

## 2023-12-29 DIAGNOSIS — R1110 Vomiting, unspecified: Secondary | ICD-10-CM | POA: Diagnosis not present

## 2023-12-29 DIAGNOSIS — J4521 Mild intermittent asthma with (acute) exacerbation: Secondary | ICD-10-CM

## 2023-12-29 DIAGNOSIS — J069 Acute upper respiratory infection, unspecified: Secondary | ICD-10-CM | POA: Diagnosis not present

## 2023-12-29 LAB — POC COVID19/FLU A&B COMBO
Covid Antigen, POC: NEGATIVE
Influenza A Antigen, POC: NEGATIVE
Influenza B Antigen, POC: NEGATIVE

## 2023-12-29 LAB — SURGICAL PATHOLOGY

## 2023-12-29 MED ORDER — BENZONATATE 100 MG PO CAPS
100.0000 mg | ORAL_CAPSULE | Freq: Three times a day (TID) | ORAL | 0 refills | Status: DC | PRN
Start: 2023-12-29 — End: 2024-02-16

## 2023-12-29 MED ORDER — PREDNISONE 20 MG PO TABS: 40.0000 mg | ORAL_TABLET | Freq: Every day | ORAL | 0 refills | Status: AC

## 2023-12-29 MED ORDER — ALBUTEROL SULFATE (2.5 MG/3ML) 0.083% IN NEBU
2.5000 mg | INHALATION_SOLUTION | Freq: Once | RESPIRATORY_TRACT | Status: AC
  Administered 2023-12-29: 2.5 mg via RESPIRATORY_TRACT

## 2023-12-29 NOTE — Op Note (Signed)
[facility] Patient Name: [name] Procedure Date: 12/29/2023 MRN: [id number] Attending MD: Particia Lather , , [id number] Date of Birth: [birth year] CSN: [id number] Age: 78 Admit Type: Outpatient Procedure:                Upper GI endoscopy Indications:              Iron deficiency anemia Providers:                Madelyn Brunner" Marcelline Mates,                            Technician Referring MD:             Buckner Malta Medicines:                Monitored Anesthesia Care Complications:            No immediate complications. Estimated Blood Loss:     Estimated blood loss was minimal. Procedure:                Pre-Anesthesia Assessment:                           - Prior to the procedure, a History and Physical                            was performed, and patient medications and                            allergies were reviewed. The patient's tolerance of                            previous anesthesia was also reviewed. The risks                            and benefits of the procedure and the sedation                            options and risks were discussed with the patient.                            All questions were answered, and informed consent                            was obtained. Prior Anticoagulants: The patient has                            taken no anticoagulant or antiplatelet agents. ASA                            Grade Assessment: III - A patient with severe                            systemic disease. After reviewing the risks and  benefits, the patient was deemed in satisfactory                            condition to undergo the procedure.                           After obtaining informed consent, the endoscope was                            passed under direct vision. Throughout the                            procedure, the patient's blood pressure, pulse, and                             oxygen saturations were monitored continuously. The                            [id number]-[id number] ([id number]) Olympus endoscope was introduced                            through the mouth, and advanced to the second part                            of duodenum. The upper GI endoscopy was                            accomplished without difficulty. The patient                            tolerated the procedure well. Scope In: Scope Out: Findings:      White nummular lesions were noted in the entire esophagus. Biopsies were       taken with a cold forceps for histology.      One benign-appearing, intrinsic moderate (circumferential scarring or       stenosis; an endoscope may pass) stenosis was found at the       gastroesophageal junction. This stenosis measured less than one cm (in       length). The stenosis was traversed.      A hiatal hernia was present.      Localized inflammation characterized by congestion (edema), erosions and       erythema was found in the gastric antrum. Biopsies were taken with a       cold forceps for histology.      The examined duodenum was normal. Impression:               - White nummular lesions in esophageal mucosa.                            Biopsied.                           - Benign-appearing esophageal stenosis.                           - Hiatal hernia.                           -  Gastritis. Biopsied.                           - Normal examined duodenum. Moderate Sedation:      Not Applicable - Patient had care per Anesthesia. Recommendation:           - Await pathology results.                           - Perform a colonoscopy today. Procedure Code(s):        --- Professional ---                           [phone], Esophagogastroduodenoscopy, flexible,                            transoral; with biopsy, single or multiple Diagnosis Code(s):        --- Professional ---                           K22.89, Other specified disease of esophagus                            K22.2, Esophageal obstruction                           K44.9, Diaphragmatic hernia without obstruction or                            gangrene                           K29.70, Gastritis, unspecified, without bleeding                           D50.9, Iron deficiency anemia, unspecified CPT copyright 2022 American Medical Association. All rights reserved. The codes documented in this report are preliminary and upon coder review may  be revised to meet current compliance requirements. Dr Particia Lather "[name]" [name],  12/29/2023 11:41:50 AM Number of Addenda: 0

## 2023-12-29 NOTE — Discharge Instructions (Signed)
Your flu and COVID test was negative  Take benzonatate 100 mg, 1 tab every 8 hours as needed for cough.  Take prednisone 20 mg--2 daily for 5 days  Continue to use the albuterol and the inhaler or nebulizer which she already have at home, as needed  We have drawn blood to check your electrolytes and sugar.  Our staff will notify you if anything is significantly abnormal.

## 2023-12-29 NOTE — Op Note (Signed)
[facility] Patient Name: [name] Procedure Date: 12/29/2023 MRN: [id number] Attending MD: Particia Lather , , [id number] Date of Birth: 07/19/1946 CSN: [id number] Age: 78 Admit Type: Outpatient Procedure:                Colonoscopy Indications:              Iron deficiency anemia, Change in bowel habits Providers:                Madelyn Brunner" Marcelline Mates,                            Technician Referring MD:             Buckner Malta Medicines:                Monitored Anesthesia Care Complications:            No immediate complications. Estimated Blood Loss:     Estimated blood loss was minimal. Procedure:                Pre-Anesthesia Assessment:                           - Prior to the procedure, a History and Physical                            was performed, and patient medications and                            allergies were reviewed. The patient's tolerance of                            previous anesthesia was also reviewed. The risks                            and benefits of the procedure and the sedation                            options and risks were discussed with the patient.                            All questions were answered, and informed consent                            was obtained. Prior Anticoagulants: The patient has                            taken no anticoagulant or antiplatelet agents. ASA                            Grade Assessment: III - A patient with severe                            systemic disease. After reviewing the risks and  benefits, the patient was deemed in satisfactory                            condition to undergo the procedure.                           After obtaining informed consent, the colonoscope                            was passed under direct vision. Throughout the                            procedure, the patient's blood pressure, pulse, and                             oxygen saturations were monitored continuously. The                            [id number]-HQ190L ([id number]) Olympus colonoscope was                            introduced through the anus and advanced to the the                            terminal ileum. The colonoscopy was performed                            without difficulty. The patient tolerated the                            procedure well. The quality of the bowel                            preparation was good. The terminal ileum, ileocecal                            valve, appendiceal orifice, and rectum were                            photographed. Scope In: Scope Out: Findings:      The terminal ileum appeared normal.      Three sessile polyps were found in the transverse colon and cecum. The       polyps were 3 to 6 mm in size. These polyps were removed with a cold       snare. Resection and retrieval were complete.      Multiple diverticula were found in the sigmoid colon.      Non-bleeding internal hemorrhoids were found during retroflexion. Impression:               - The examined portion of the ileum was normal.                           - Three 3 to 6 mm polyps in the transverse colon  and in the cecum, removed with a cold snare.                            Resected and retrieved.                           - Diverticulosis in the sigmoid colon.                           - Non-bleeding internal hemorrhoids. Moderate Sedation:      Not Applicable - Patient had care per Anesthesia. Recommendation:           - Discharge patient to home (with escort).                           - Await pathology results.                           - Anusol HC cream BID for 7 days.                           - Return to GI clinic in 2-3 months for follow up                            of constipation.                           - The findings and recommendations were discussed                             with the patient. Procedure Code(s):        --- Professional ---                           [id number], Colonoscopy, flexible; with removal of                            tumor(s), polyp(s), or other lesion(s) by snare                            technique Diagnosis Code(s):        --- Professional ---                           K64.8, Other hemorrhoids                           D12.3, Benign neoplasm of transverse colon (hepatic                            flexure or splenic flexure)                           D12.0, Benign neoplasm of cecum                           D50.9, Iron deficiency anemia,  unspecified                           R19.4, Change in bowel habit                           K57.30, Diverticulosis of large intestine without                            perforation or abscess without bleeding CPT copyright 2022 American [employer] Association. All rights reserved. The codes documented in this report are preliminary and upon coder review may  be revised to meet current compliance requirements. Dr Particia Lather "[name]" [name],  12/29/2023 11:45:49 AM Number of Addenda: 0

## 2023-12-29 NOTE — ED Provider Notes (Signed)
[facility]    CSN: [id number] Arrival date & time: 12/29/23  1039      History   Chief Complaint Chief Complaint  Patient presents with   Cough   Shortness of Breath    HPI [name] is a 78 y.o. female.    Cough Associated symptoms: shortness of breath   Shortness of Breath Associated symptoms: cough   Here for cough and nasal congestion and rhinorrhea.  She is also been having some shortness of breath and extra wheezing.  Symptoms began on January 28.  Also, she had to prep for a colonoscopy and EGD that she had yesterday, January 30.  During that time she did have a couple of episodes of vomiting and of course had a lot of watery diarrhea with the prep.  No blood.  She has not noted any fever that she could tell. She states she does not feel nauseated today but feels nervous.  She is allergic to codeine and sulfa.    Past Medical History:  Diagnosis Date   ABDOMINAL PAIN RIGHT LOWER QUADRANT 03/03/2010   Qualifier: Diagnosis of  By: Juanda Chance MD, Hedwig Morton    Acquired trigger finger 04/20/2016   Age-related osteoporosis without current pathological fracture 01/23/2017   Allergy    Anxiety    Arthritis    Asthma    Bilateral carotid artery stenosis 01/16/2020   Bilateral primary osteoarthritis of knee 10/16/2018   Cataract    bilateral and removed   Change in bowel function 11/07/2022   Chest pain 11/01/2013   Closed fracture of distal end of radius 06/05/2016   Closed fracture of nasal bones 04/26/2023   Last Assessment & Plan: Formatting of this note might be different from the original. Nasal trauma. See history of present illness.  Denies nasal obstruction or concern over the appearance of the nose.  CT is reviewed and shows minimally displaced right nasal bone fracture. EXAM shows nasal dorsum in the midline.  Still little edema of the nasal dorsum.  Intranasal exam shows midline nasal septum.   DDD (degenerative disc disease), lumbar  01/23/2017   DECREASED HEARING 05/02/2008   Qualifier: Diagnosis of   By: Nelson-Smith CMA (AAMA), Dottie      Replacing diagnoses that were inactivated after the 02/27/23 regulatory import   Depression    Diabetes mellitus type II, non insulin dependent (HCC) 07/05/2019   DM 03/03/2010   Qualifier: Diagnosis of  By: Katrinka Blazing CMA, Tresa Endo     DM (diabetes mellitus) (HCC)    DOE (dyspnea on exertion) 05/06/2019   Dyslipidemia 01/23/2017   Dyspnea    Esophageal reflux    ESOPHAGITIS 05/02/2008   Qualifier: Diagnosis of   By: Candice Camp CMA (AAMA), Dottie      Replacing diagnoses that were inactivated after the 02/27/23 regulatory import   Essential hypertension 05/02/2008   Qualifier: Diagnosis of  By: Nelson-Smith CMA (AAMA), Dottie     Full incontinence of feces 03/03/2010   Qualifier: Diagnosis of  By: Juanda Chance MD, Hedwig Morton    H/O adenomatous polyp of colon 2011   H/O allergic rhinitis 07/29/2015   Hearing loss, mixed, bilateral 12/17/2018   Heart murmur    no cardiologist, family Dr. Zollie Scale   HIATAL HERNIA 05/02/2008   Qualifier: Diagnosis of   By: Candice Camp CMA (AAMA), Dottie      Replacing diagnoses that were inactivated after the 02/27/23 regulatory import   History of asthma 01/23/2017   History of depression 01/23/2017  History of diabetes mellitus 01/23/2017   History of gastroesophageal reflux (GERD) 01/17/2017   History of kidney stones    History of transient ischemic attack (TIA) 01/16/2020   Hyperlipidemia    Irritable bowel syndrome    Kidney stones    LPRD (laryngopharyngeal reflux disease) 07/29/2015   Mallory-Weiss tear 07/03/2022   Mild aortic stenosis 07/05/2019   Mitral regurgitation 07/05/2019   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 Here for medical clearance for the right ear. Noted to have recent  worsening of her right ear on audiogram at an outside facility.  Denies any pain.  In general, she is functioning well with her current hearing aid though she is looking at getting a new one.  Left ear with profound loss from prior surgery.  Audio   Murmur 11/01/2013   Normocytic anemia 01/16/2020   Obstructive lung disease (HCC) 07/29/2015   OSA (obstructive sleep apnea) 09/28/2018   Pain in left wrist 06/06/2016   Pedal edema 05/06/2019   Primary osteoarthritis of both feet 01/17/2017   Primary osteoarthritis of both knees 01/17/2017   Primary osteoarthritis, left shoulder 12/11/2017   RECTAL INCONTINENCE 05/02/2008   Qualifier: Diagnosis of   By: Candice Camp CMA (AAMA), Dottie      Replacing diagnoses that were inactivated after the 02/27/23 regulatory import   Retained orthopedic hardware 11/14/2016   Rheumatoid factor positive 12/13/2016   Sarcoidosis    Severe persistent asthma 11/16/2007   Annotation: chronic Qualifier: Diagnosis of  By: Sherene Sires MD, [name]    Shoulder arthritis 02/22/2018   Sleep apnea    no cpap now   Status post dilation of esophageal narrowing    Stiffness of left wrist joint 06/06/2016   Stroke (HCC)    Vitamin D deficiency 01/17/2017    Patient Active Problem List   Diagnosis Date Noted   Iron deficiency anemia 12/28/2023   Closed fracture of nasal bones 04/26/2023   Mixed conductive and sensorineural hearing loss of right ear with restricted hearing of left ear 11/08/2022   Change in bowel function 11/07/2022   Mallory-Weiss tear 07/03/2022   Allergy    Anxiety    Arthritis    Cataract    Depression    DM (diabetes mellitus) (HCC)    Dyspnea    Heart murmur    History of kidney stones    Hyperlipidemia    Kidney stones    Sleep apnea    Status post dilation of esophageal narrowing    Asthma 01/16/2020   History of transient ischemic attack (TIA) 01/16/2020   Bilateral carotid artery stenosis 01/16/2020   Normocytic anemia 01/16/2020    Diabetes mellitus type II, non insulin dependent (HCC) 07/05/2019   Mild aortic stenosis 07/05/2019   Mitral regurgitation 07/05/2019   Pedal edema 05/06/2019   DOE (dyspnea on exertion) 05/06/2019   Hearing loss, mixed, bilateral 12/17/2018   Bilateral primary osteoarthritis of knee 10/16/2018   OSA (obstructive sleep apnea) 09/28/2018   Shoulder arthritis 02/22/2018   Primary osteoarthritis, left shoulder 12/11/2017   DDD (degenerative disc disease), lumbar 01/23/2017   History of diabetes mellitus 01/23/2017   History of depression 01/23/2017   History of asthma 01/23/2017   Dyslipidemia 01/23/2017   Age-related osteoporosis without current pathological fracture 01/23/2017   History of gastroesophageal reflux (GERD) 01/17/2017   Primary osteoarthritis of both  knees 01/17/2017   Primary osteoarthritis of both feet 01/17/2017   Vitamin D deficiency 01/17/2017   Rheumatoid factor positive 12/13/2016   Retained orthopedic hardware 11/14/2016   Stiffness of left wrist joint 06/06/2016   Closed fracture of distal end of radius 06/05/2016   Acquired trigger finger 04/20/2016   Obstructive lung disease (HCC) 07/29/2015   H/O allergic rhinitis 07/29/2015   LPRD (laryngopharyngeal reflux disease) 07/29/2015   Chest pain 11/01/2013   Murmur 11/01/2013   DM 03/03/2010   ABDOMINAL PAIN RIGHT LOWER QUADRANT 03/03/2010   FULL INCONTINENCE OF FECES 03/03/2010   H/O adenomatous polyp of colon 2011   DECREASED HEARING 05/02/2008   Essential hypertension 05/02/2008   ESOPHAGITIS 05/02/2008   HIATAL HERNIA 05/02/2008   RECTAL INCONTINENCE 05/02/2008   SARCOIDOSIS 11/16/2007   Severe persistent asthma 11/16/2007   GERD 11/16/2007   IRRITABLE BOWEL SYNDROME 11/16/2007    Past Surgical History:  Procedure Laterality Date   BIOPSY  12/28/2023   Procedure: BIOPSY;  Surgeon: Imogene Burn, MD;  Location: [facility];  Service: Gastroenterology;;   BLADDER SURGERY     CATARACT EXTRACTION  Bilateral    COLONOSCOPY WITH PROPOFOL N/A 12/28/2023   Procedure: COLONOSCOPY WITH PROPOFOL;  Surgeon: Imogene Burn, MD;  Location: [facility];  Service: Gastroenterology;  Laterality: N/A;   ESOPHAGOGASTRODUODENOSCOPY (EGD) WITH PROPOFOL N/A 12/28/2023   Procedure: ESOPHAGOGASTRODUODENOSCOPY (EGD) WITH PROPOFOL;  Surgeon: Imogene Burn, MD;  Location: [facility];  Service: Gastroenterology;  Laterality: N/A;   HARDWARE REMOVAL Left 01/12/2017   Procedure: HARDWARE REMOVAL left wrist;  Surgeon: Betha Loa, MD;  Location: [facility];  Service: Orthopedics;  Laterality: Left;   HEMORRHOID SURGERY     NASAL SEPTUM SURGERY     PARTIAL HYSTERECTOMY     POLYPECTOMY  12/28/2023   Procedure: POLYPECTOMY;  Surgeon: Imogene Burn, MD;  Location: [facility];  Service: Gastroenterology;;   REVERSE SHOULDER ARTHROPLASTY Left 02/22/2018   REVERSE SHOULDER ARTHROPLASTY Left 02/22/2018   Procedure: LEFT REVERSE SHOULDER ARTHROPLASTY;  Surgeon: Cammy Copa, MD;  Location: [facility];  Service: Orthopedics;  Laterality: Left;   TONSILLECTOMY AND ADENOIDECTOMY     TRANSCAROTID ARTERY REVASCULARIZATION  Left 01/17/2020   Procedure: TRANSCAROTID ARTERY REVASCULARIZATION;  Surgeon: Nada Libman, MD;  Location: [facility];  Service: Vascular;  Laterality: Left;   TRIGGER FINGER RELEASE Left 05/12/2016   Procedure: RELEASE TRIGGER FINGER/A-1 PULLEY INFECTION LEFT RING TENDON SHEATH INJECT RIGHT INDEX FINGER;  Surgeon: Betha Loa, MD;  Location: [facility];  Service: Orthopedics;  Laterality: Left;   TUBAL LIGATION      OB History   No obstetric history on file.      Home Medications    Prior to Admission medications   Medication Sig Start Date End Date Taking? Authorizing Provider  benzonatate (TESSALON) 100 MG capsule Take 1 capsule (100 mg total) by mouth 3 (three) times daily as needed for cough. 12/29/23  Yes Zenia Resides, MD  predniSONE (DELTASONE) 20 MG  tablet Take 2 tablets (40 mg total) by mouth daily with breakfast for 5 days. 12/29/23 01/03/24 Yes Zenia Resides, MD  acetaminophen (TYLENOL) 325 MG tablet Take 2 tablets (650 mg total) by mouth every 6 (six) hours as needed for mild pain (or Fever >/= 101). 01/18/20   Zannie Cove, MD  albuterol (VENTOLIN HFA) 108 (90 Base) MCG/ACT inhaler Inhale 2 puffs into the lungs every 6 (six) hours as needed for wheezing or shortness  of breath.    [provider]  alendronate (FOSAMAX) 70 MG tablet Take 70 mg by mouth once a week. 12/15/21   [provider]  aspirin 81 MG EC tablet Take 1 tablet by mouth daily.    [provider]  atorvastatin (LIPITOR) 40 MG tablet Take 40 mg by mouth at bedtime. 07/21/20   [provider]  citalopram (CELEXA) 20 MG tablet Take 20 mg by mouth daily.    [provider]  diclofenac (VOLTAREN) 25 MG EC tablet Take 25 mg by mouth daily. 05/01/23   [provider]  dicyclomine (BENTYL) 20 MG tablet Take 20 mg by mouth as needed for spasms.    [provider]  EPINEPHrine 0.3 mg/0.3 mL IJ SOAJ injection Use as directed for life-threatening allergic reaction. 10/28/20   Kozlow, Alvira Philips, MD  famotidine (PEPCID) 40 MG tablet TAKE 1 TABLET BY MOUTH EVERY DAY AT BEDTIME 09/25/23   Kozlow, Alvira Philips, MD  Ferrous Sulfate (IRON PO) Take 1 tablet by mouth daily.    [provider]  fluticasone (FLONASE) 50 MCG/ACT nasal spray Place 1-2 sprays into both nostrils as needed for allergies or rhinitis.    [provider]  fluticasone-salmeterol (ADVAIR) 250-50 MCG/ACT AEPB Inhale one puff twice daily to prevent cough or wheeze. Rinse mouth after use. 11/13/23   Kozlow, Alvira Philips, MD  gabapentin (NEURONTIN) 100 MG capsule Take 100 mg by mouth as needed (arthritis pain).    [provider]  ipratropium (ATROVENT) 0.06 % nasal spray INSTILL 1-2 SPRAYS IN EACH NOSTRIL EVERY 6 HOURS IF needed 05/03/23   Kozlow, Alvira Philips,  MD  ipratropium-albuterol (DUONEB) 0.5-2.5 (3) MG/3ML SOLN Take 3 mLs by nebulization every 6 (six) hours as needed (shortness of breath/wheezing). 12/04/23   Kozlow, Alvira Philips, MD  levocetirizine (XYZAL) 5 MG tablet Take 5 mg by mouth at bedtime.  01/31/19   [provider]  metoprolol succinate (TOPROL-XL) 25 MG 24 hr tablet Take 25 mg by mouth at bedtime. 09/15/22   [provider]  montelukast (SINGULAIR) 10 MG tablet Take 10 mg by mouth daily. 02/20/19   [provider]  Na Sulfate-K Sulfate-Mg Sulf 17.5-3.13-1.6 GM/177ML SOLN Use as directed; may use generic; goodrx card if insurance will not cover generic 11/27/23   Imogene Burn, MD  omeprazole (PRILOSEC) 40 MG capsule Take 40 mg by mouth 2 (two) times daily. 11/10/22   [provider]  Probiotic Product (PROBIOTIC DAILY PO) Take 1 capsule by mouth daily.     [provider]  Semaglutide,0.25 or 0.5MG /DOS, (OZEMPIC, 0.25 OR 0.5 MG/DOSE,) 2 MG/1.5ML SOPN Inject 0.25 mg into the skin once a week. 12/28/23   Imogene Burn, MD  tamsulosin (FLOMAX) 0.4 MG CAPS capsule Take 0.4 mg by mouth daily. 04/22/23   [provider]  Telmisartan-amLODIPine 80-5 MG TABS Take 1 tablet by mouth daily. 09/15/22   [provider]  Tezepelumab-ekko (TEZSPIRE) 210 MG/1. SOAJ Inject 210 mg into the skin every 28 (twenty-eight) days. 10/23/23   Kozlow, Alvira Philips, MD  umeclidinium bromide (INCRUSE ELLIPTA) 62.5 MCG/ACT AEPB Inhale 1 puff into the lungs daily. 11/20/23   Kozlow, Alvira Philips, MD    Family History Family History  Problem Relation Age of Onset   Esophageal cancer Father    Diabetes Mother    Diabetes Maternal Grandmother    Colon cancer Neg Hx    Rectal cancer Neg Hx    Stomach cancer Neg Hx  Social History Social History   Tobacco Use   Smoking status: Never   Smokeless tobacco: Never  Vaping Use   Vaping status: Never Used  Substance Use Topics   Alcohol use: No   Drug use: No      Allergies   Codeine and Sulfa antibiotics   Review of Systems Review of Systems  Respiratory:  Positive for cough and shortness of breath.      Physical Exam Triage Vital Signs ED Triage Vitals  Encounter Vitals Group     BP 12/29/23 1228 (!) 144/75     Systolic BP Percentile --      Diastolic BP Percentile --      Pulse Rate 12/29/23 1228 65     Resp 12/29/23 1228 18     Temp 12/29/23 1228 98.5 F (36.9 C)     Temp Source 12/29/23 1228 Oral     SpO2 12/29/23 1228 98 %     Weight --      Height --      Head Circumference --      Peak Flow --      Pain Score 12/29/23 1227 0     Pain Loc --      Pain Education --      Exclude from Growth Chart --    No data found.  Updated Vital Signs BP (!) 144/75 (BP Location: Right Arm)   Pulse 65   Temp 98.5 F (36.9 C) (Oral)   Resp 18   SpO2 98%   Visual Acuity Right Eye Distance:   Left Eye Distance:   Bilateral Distance:    Right Eye Near:   Left Eye Near:    Bilateral Near:     Physical Exam Vitals reviewed.  Constitutional:      General: She is not in acute distress.    Appearance: She is not ill-appearing, toxic-appearing or diaphoretic.  HENT:     Right Ear: Tympanic membrane and ear canal normal.     Left Ear: Tympanic membrane and ear canal normal.     Nose: Congestion present.     Mouth/Throat:     Mouth: Mucous membranes are moist.     Pharynx: No oropharyngeal exudate or posterior oropharyngeal erythema.  Eyes:     Extraocular Movements: Extraocular movements intact.     Conjunctiva/sclera: Conjunctivae normal.     Pupils: Pupils are equal, round, and reactive to light.  Cardiovascular:     Rate and Rhythm: Normal rate and regular rhythm.     Heart sounds: No murmur heard. Pulmonary:     Effort: No respiratory distress.     Breath sounds: No stridor. No rhonchi or rales.     Comments: There is end expiratory wheezing throughout the lung fields.  Air movement is still  good. Musculoskeletal:     Cervical back: Neck supple.  Lymphadenopathy:     Cervical: No cervical adenopathy.  Skin:    Capillary Refill: Capillary refill takes less than 2 seconds.     Coloration: Skin is not jaundiced or pale.  Neurological:     General: No focal deficit present.     Mental Status: She is alert and oriented to person, place, and time.  Psychiatric:        Behavior: Behavior normal.      UC Treatments / Results  Labs (all labs ordered are listed, but only abnormal results are displayed) Labs Reviewed  POC COVID19/FLU A&B COMBO - Normal  BASIC METABOLIC PANEL  EKG   Radiology No results found.  Procedures Procedures (including critical care time)  Medications Ordered in UC Medications  albuterol (PROVENTIL) (2.5 MG/3ML) 0.083% nebulizer solution 2.5 mg (2.5 mg Nebulization Given 12/29/23 1241)    Initial Impression / Assessment and Plan / UC Course  I have reviewed the triage vital signs and the nursing notes.  Pertinent labs & imaging results that were available during my care of the patient were reviewed by me and considered in my medical decision making (see chart for details).     Flu and covid test negative  Felt better after neb tx. Tessalon perles sent in for cough, 5 day burst of prednisone sent in for the asthma exacerbation.  BMP done today since had a good bit of fluid losses with the bowel prep, including a couple of episodes of emesis. Staff will call her if anything is significantly abnormal. Final Clinical Impressions(s) / UC Diagnoses   Final diagnoses:  Mild intermittent asthma with (acute) exacerbation  Vomiting, unspecified vomiting type, unspecified whether nausea present  Viral URI with cough     Discharge Instructions      Your flu and COVID test was negative  Take benzonatate 100 mg, 1 tab every 8 hours as needed for cough.  Take prednisone 20 mg--2 daily for 5 days  Continue to use the albuterol and the  inhaler or nebulizer which she already have at home, as needed  We have drawn blood to check your electrolytes and sugar.  Our staff will notify you if anything is significantly abnormal.      ED Prescriptions     Medication Sig Dispense Auth. Provider   benzonatate (TESSALON) 100 MG capsule Take 1 capsule (100 mg total) by mouth 3 (three) times daily as needed for cough. 21 capsule Zenia Resides, MD   predniSONE (DELTASONE) 20 MG tablet Take 2 tablets (40 mg total) by mouth daily with breakfast for 5 days. 10 tablet Marlinda Mike Janace Aris, MD      PDMP not reviewed this encounter.   Zenia Resides, MD 12/29/23 [phone]

## 2023-12-29 NOTE — Progress Notes (Signed)
Hi Beth, please call the patient to let her know that her esophageal biopsies showed an infection with a yeast called candida. I would recommend treating her with fluconazole 400 mg x 1 dose, followed by 200 mg every day for 13 days.

## 2023-12-29 NOTE — ED Triage Notes (Signed)
Pt c/o dry cough, runny nose, and wheezing since Tuesday. Has used inhaler, nebulizer, and nasal sprays with some relief.

## 2023-12-30 LAB — BASIC METABOLIC PANEL
BUN/Creatinine Ratio: 18 (ref 12–28)
BUN: 12 mg/dL (ref 8–27)
CO2: 17 mmol/L — ABNORMAL LOW (ref 20–29)
Calcium: 9 mg/dL (ref 8.7–10.3)
Chloride: 107 mmol/L — ABNORMAL HIGH (ref 96–106)
Creatinine, Ser: 0.68 mg/dL (ref 0.57–1.00)
Glucose: 90 mg/dL (ref 70–99)
Potassium: 4.1 mmol/L (ref 3.5–5.2)
Sodium: 141 mmol/L (ref 134–144)
eGFR: 90 mL/min/{1.73_m2} (ref >59)

## 2024-01-01 ENCOUNTER — Ambulatory Visit (INDEPENDENT_AMBULATORY_CARE_PROVIDER_SITE_OTHER): Payer: Medicare Other | Admitting: *Deleted

## 2024-01-01 ENCOUNTER — Other Ambulatory Visit: Payer: Self-pay

## 2024-01-01 DIAGNOSIS — J4550 Severe persistent asthma, uncomplicated: Secondary | ICD-10-CM

## 2024-01-01 MED ORDER — FLUCONAZOLE 100 MG PO TABS: ORAL_TABLET | ORAL | 0 refills | Status: DC

## 2024-01-08 ENCOUNTER — Ambulatory Visit: Payer: Medicare Other | Admitting: Podiatry

## 2024-01-08 ENCOUNTER — Encounter: Payer: Self-pay | Admitting: Podiatry

## 2024-01-08 DIAGNOSIS — M25572 Pain in left ankle and joints of left foot: Secondary | ICD-10-CM | POA: Diagnosis not present

## 2024-01-08 DIAGNOSIS — E119 Type 2 diabetes mellitus without complications: Secondary | ICD-10-CM

## 2024-01-08 DIAGNOSIS — M2041 Other hammer toe(s) (acquired), right foot: Secondary | ICD-10-CM

## 2024-01-08 DIAGNOSIS — M2042 Other hammer toe(s) (acquired), left foot: Secondary | ICD-10-CM | POA: Diagnosis not present

## 2024-01-08 NOTE — Progress Notes (Signed)
 Chief Complaint  Patient presents with   Sinus Tarsi     Left ankle, has the speed brace, no pain. She did not wear it yesterday to church and had no pain. Would like to come out of the brace. Last A1c not listed but she goes next week to get blood work. Takes ASA 81, FBS has been in the 90-100 in the mornings    HPI: 78 y.o. female presents today for follow up evaluation of left sinus tarsi pain.  History of low back pain.  Does have diabetes, unsure of last A1c.  Does have bilateral hammertoes with crossover deformity, bunions.  Has been using some gel padding.  Patient states sinus tarsi pain is significantly improved.  She has been using lace up ankle brace.  Denies any pain today.  Past Medical History:  Diagnosis Date   ABDOMINAL PAIN RIGHT LOWER QUADRANT 03/03/2010   Qualifier: Diagnosis of  By: Grandville Lax MD, Raj Burdock    Acquired trigger finger 04/20/2016   Age-related osteoporosis without current pathological fracture 01/23/2017   Allergy    Anxiety    Arthritis    Asthma    Bilateral carotid artery stenosis 01/16/2020   Bilateral primary osteoarthritis of knee 10/16/2018   Cataract    bilateral and removed   Change in bowel function 11/07/2022   Chest pain 11/01/2013   Closed fracture of distal end of radius 06/05/2016   Closed fracture of nasal bones 04/26/2023   Last Assessment & Plan: Formatting of this note might be different from the original. Nasal trauma. See history of present illness.  Denies nasal obstruction or concern over the appearance of the nose.  CT is reviewed and shows minimally displaced right nasal bone fracture. EXAM shows nasal dorsum in the midline.  Still little edema of the nasal dorsum.  Intranasal exam shows midline nasal septum.   DDD (degenerative disc disease), lumbar 01/23/2017   DECREASED HEARING 05/02/2008   Qualifier: Diagnosis of   By: Misty Amour CMA (AAMA), Dottie      Replacing diagnoses that were inactivated after the 02/27/23  regulatory import   Depression    Diabetes mellitus type II, non insulin  dependent (HCC) 07/05/2019   DM 03/03/2010   Qualifier: Diagnosis of  By: Felipe Horton CMA, Loetta Ringer     DM (diabetes mellitus) (HCC)    DOE (dyspnea on exertion) 05/06/2019   Dyslipidemia 01/23/2017   Dyspnea    Esophageal reflux    ESOPHAGITIS 05/02/2008   Qualifier: Diagnosis of   By: Misty Amour CMA (AAMA), Dottie      Replacing diagnoses that were inactivated after the 02/27/23 regulatory import   Essential hypertension 05/02/2008   Qualifier: Diagnosis of  By: Nelson-Smith CMA (AAMA), Dottie     Full incontinence of feces 03/03/2010   Qualifier: Diagnosis of  By: Grandville Lax MD, Raj Burdock    H/O adenomatous polyp of colon 2011   H/O allergic rhinitis 07/29/2015   Hearing loss, mixed, bilateral 12/17/2018   Heart murmur    no cardiologist, family Dr. Lucillie Saba   HIATAL HERNIA 05/02/2008   Qualifier: Diagnosis of   By: Misty Amour CMA (AAMA), Dottie      Replacing diagnoses that were inactivated after the 02/27/23 regulatory import   History of asthma 01/23/2017   History of depression 01/23/2017   History of diabetes mellitus 01/23/2017   History of gastroesophageal reflux (GERD) 01/17/2017   History of kidney stones    History of transient ischemic attack (TIA) 01/16/2020  Hyperlipidemia    Irritable bowel syndrome    Kidney stones    LPRD (laryngopharyngeal reflux disease) 07/29/2015   Mallory-Weiss tear 07/03/2022   Mild aortic stenosis 07/05/2019   Mitral regurgitation 07/05/2019   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 Here for medical clearance for the right ear. Noted to have recent worsening of her right ear on audiogram at an outside facility.  Denies any pain.  In general, she is functioning well with her current hearing aid though she is looking at getting  a new one.  Left ear with profound loss from prior surgery.  Audio   Murmur 11/01/2013   Normocytic anemia 01/16/2020   Obstructive lung disease (HCC) 07/29/2015   OSA (obstructive sleep apnea) 09/28/2018   Pain in left wrist 06/06/2016   Pedal edema 05/06/2019   Primary osteoarthritis of both feet 01/17/2017   Primary osteoarthritis of both knees 01/17/2017   Primary osteoarthritis, left shoulder 12/11/2017   RECTAL INCONTINENCE 05/02/2008   Qualifier: Diagnosis of   By: Misty Amour CMA (AAMA), Dottie      Replacing diagnoses that were inactivated after the 02/27/23 regulatory import   Retained orthopedic hardware 11/14/2016   Rheumatoid factor positive 12/13/2016   Sarcoidosis    Severe persistent asthma 11/16/2007   Annotation: chronic Qualifier: Diagnosis of  By: Waymond Hailey MD, Nadene Aures    Shoulder arthritis 02/22/2018   Sleep apnea    no cpap now   Status post dilation of esophageal narrowing    Stiffness of left wrist joint 06/06/2016   Stroke (HCC)    Vitamin D  deficiency 01/17/2017    Past Surgical History:  Procedure Laterality Date   BIOPSY  12/28/2023   Procedure: BIOPSY;  Surgeon: Daina Drum, MD;  Location: [facility];  Service: Gastroenterology;;   BLADDER SURGERY     CATARACT EXTRACTION Bilateral    COLONOSCOPY WITH PROPOFOL  N/A 12/28/2023   Procedure: COLONOSCOPY WITH PROPOFOL ;  Surgeon: Daina Drum, MD;  Location: [facility];  Service: Gastroenterology;  Laterality: N/A;   ESOPHAGOGASTRODUODENOSCOPY (EGD) WITH PROPOFOL  N/A 12/28/2023   Procedure: ESOPHAGOGASTRODUODENOSCOPY (EGD) WITH PROPOFOL ;  Surgeon: Daina Drum, MD;  Location: [facility];  Service: Gastroenterology;  Laterality: N/A;   HARDWARE REMOVAL Left 01/12/2017   Procedure: HARDWARE REMOVAL left wrist;  Surgeon: Brunilda Capra, MD;  Location: [facility];  Service: Orthopedics;  Laterality: Left;   HEMORRHOID SURGERY     NASAL SEPTUM SURGERY     PARTIAL HYSTERECTOMY     POLYPECTOMY   12/28/2023   Procedure: POLYPECTOMY;  Surgeon: Daina Drum, MD;  Location: [facility];  Service: Gastroenterology;;   REVERSE SHOULDER ARTHROPLASTY Left 02/22/2018   REVERSE SHOULDER ARTHROPLASTY Left 02/22/2018   Procedure: LEFT REVERSE SHOULDER ARTHROPLASTY;  Surgeon: Jasmine Mesi, MD;  Location: [facility];  Service: Orthopedics;  Laterality: Left;   TONSILLECTOMY AND ADENOIDECTOMY     TRANSCAROTID ARTERY REVASCULARIZATION  Left 01/17/2020   Procedure: TRANSCAROTID ARTERY REVASCULARIZATION;  Surgeon: Margherita Shell, MD;  Location: [facility];  Service: Vascular;  Laterality: Left;   TRIGGER FINGER RELEASE Left 05/12/2016   Procedure: RELEASE TRIGGER FINGER/A-1 PULLEY INFECTION LEFT RING TENDON SHEATH INJECT RIGHT INDEX FINGER;  Surgeon: Brunilda Capra, MD;  Location: [facility];  Service: Orthopedics;  Laterality: Left;   TUBAL LIGATION      Allergies  Allergen Reactions   Codeine Nausea And Vomiting   Sulfa Antibiotics Other (See Comments)    Allergic per pt's mother     ROS denies any nausea, vomiting, fever, chills, chest pain, shortness of breath.   Physical Exam: There were no vitals filed for this visit.  General: The patient is alert and oriented x3 in no acute distress.  Dermatology: Skin is warm, dry and supple bilateral lower extremities. Interspaces are clear of maceration and debris.  Nail plates x 10 notable for dystrophic appearance with yellow discoloration and subungual debris consistent with mycotic infection.  Within normal limits for thickness and length due to recent nail trim  Vascular: Palpable pedal pulses bilaterally. Capillary refill within normal limits.  Pedal hair growth noted.  No appreciable edema.  No erythema or calor.  Telangiectasias appreciated.  Neurological: Light touch sensation grossly intact bilateral feet.  Protective sensation intact.  Vibratory sensation diminished.  Musculoskeletal Exam: Muscle strength 5/5 in dorsiflexion,  eversion, inversion, eversion.  Left foot greater than right foot there are bilateral hallux limitus deformities with hammertoe contractures.  Developing crossover deformity left first and second toes.  No tenderness on palpation of sinus tarsi.  No tenderness with resisted inversion and eversion of subtalar joint.  Hindfoot edema improved  Radiographic Exam: Left foot 12/12/2023 Normal osseous mineralization. Joint spaces preserved.  No fractures or osseous irregularities noted.  Pes planus foot type.  Some mild subchondral sclerosis of subtalar joint.  Squaring of first metatarsal head noted without increase of IM angle.  Hammertoe contractures of lesser digits.  Assessment/Plan of Care: 1. Diabetes mellitus type II, non insulin  dependent (HCC)   2. Sinus tarsi syndrome of left ankle   3. Acquired hammertoes of both feet      No orders of the defined types were placed in this encounter.  None  Discussed clinical findings with patient today.   # Sinus tarsi syndrome left foot -Significant improvement today -Deferring injection at this time - Discussed importance of good stiff soled shoes.  Patient may wean out of lace up ankle brace left ankle -She is currently taking oral diclofenac for her knees and hips occasionally  # Hammertoe with crossover deformity - Does not currently cause significant pain to the patient - Looped toe spacers dispensed the patient  Follow-up in 2 months for diabetic footcare or sooner if symptoms recur   Tamrah Victorino L. Lunda Salines, AACFAS [facility]     [address] [place], [place] [address]                Office [phone]  Fax [phone]

## 2024-01-10 DIAGNOSIS — B3781 Candidal esophagitis: Secondary | ICD-10-CM | POA: Diagnosis not present

## 2024-01-10 DIAGNOSIS — J3489 Other specified disorders of nose and nasal sinuses: Secondary | ICD-10-CM | POA: Diagnosis not present

## 2024-01-10 DIAGNOSIS — Z6825 Body mass index (BMI) 25.0-25.9, adult: Secondary | ICD-10-CM | POA: Diagnosis not present

## 2024-01-10 DIAGNOSIS — J4540 Moderate persistent asthma, uncomplicated: Secondary | ICD-10-CM | POA: Diagnosis not present

## 2024-01-15 DIAGNOSIS — Z6824 Body mass index (BMI) 24.0-24.9, adult: Secondary | ICD-10-CM | POA: Diagnosis not present

## 2024-01-15 DIAGNOSIS — J4540 Moderate persistent asthma, uncomplicated: Secondary | ICD-10-CM | POA: Diagnosis not present

## 2024-01-15 DIAGNOSIS — J069 Acute upper respiratory infection, unspecified: Secondary | ICD-10-CM | POA: Diagnosis not present

## 2024-01-16 ENCOUNTER — Other Ambulatory Visit: Payer: Self-pay | Admitting: Pharmacist

## 2024-01-16 NOTE — Progress Notes (Signed)
   01/16/2024 Name: [name] MRN: [id number] DOB: Nov 02, 1946  Chief Complaint  Patient presents with   Medication Access     Incruse     [name] is a 78 y.o. year old female who presented for a telephone visit.   They were referred to the pharmacist by their PCP for assistance in managing medication access.    Subjective:  Care Team: Primary Care Provider: Buckner Malta, MD ; Next Scheduled Visit: None   Medication Access/Adherence  Current Pharmacy:  [facility] [address] [place] - [address] [place] [address] Phone: [phone] Fax: [phone]    Assessment/Plan:   Telephonic engagement with [name] today regarding patient assistance options for Inruse Ellipta inhaler. I reviewed [employer] out of pocket spend requirement of $600 with patient. I offered to screen for patient assistance with Spiriva and patient declines at this time.  Patient reports that she plans to enroll into the Medicare Prescription Payment Plan and pay the $375 deductible over time. Patient reports that she has 2 months of Trelegy on hand in the meantime. I have encouraged patient to notify myself or office once she meets the $600 out of pocket spend and I will assist her in applying for Incruse. Patient verbalized understanding and was appreciative of my call.    Reynold Bowen, PharmD Clinical Pharmacist [place] Direct Dial: [phone]

## 2024-01-23 ENCOUNTER — Other Ambulatory Visit (HOSPITAL_COMMUNITY): Payer: Self-pay

## 2024-01-23 ENCOUNTER — Telehealth: Payer: Self-pay

## 2024-01-23 ENCOUNTER — Other Ambulatory Visit: Payer: Self-pay | Admitting: *Deleted

## 2024-01-23 MED ORDER — TEZSPIRE 210 MG/1.91ML ~~LOC~~ SOAJ: 210.0000 mg | SUBCUTANEOUS | 11 refills | Status: DC

## 2024-01-23 NOTE — Addendum Note (Signed)
 Addended by: Devoria Glassing on: 01/23/2024 01:44 PM   Modules accepted: Orders

## 2024-01-23 NOTE — Telephone Encounter (Signed)
 Sent to Tammy for possible PAP

## 2024-01-29 ENCOUNTER — Ambulatory Visit: Payer: Medicare Other

## 2024-02-01 ENCOUNTER — Ambulatory Visit (INDEPENDENT_AMBULATORY_CARE_PROVIDER_SITE_OTHER): Admitting: *Deleted

## 2024-02-01 DIAGNOSIS — J4550 Severe persistent asthma, uncomplicated: Secondary | ICD-10-CM

## 2024-02-05 DIAGNOSIS — G4733 Obstructive sleep apnea (adult) (pediatric): Secondary | ICD-10-CM | POA: Diagnosis not present

## 2024-02-06 ENCOUNTER — Telehealth: Payer: Self-pay | Admitting: Pharmacy Technician

## 2024-02-06 DIAGNOSIS — Z5986 Financial insecurity: Secondary | ICD-10-CM

## 2024-02-06 NOTE — Progress Notes (Signed)
 Pharmacy Medication Assistance Program Note    02/06/2024  Patient ID: [name], female   DOB: [birth year], 78 y.o.   MRN: [id number]     12/25/2023 02/06/2024  Outreach Medication One  Initial Outreach Date (Medication One) 12/20/2023   Manufacturer Medication One [employer] [facility] Ozempic   Dose of Ozempic 4mg /47ml   Type of Radiographer, therapeutic Assistance   Date Application Sent to Patient 12/27/2023   Application Items Requested Application;Proof of Income;Other   Date Application Sent to Prescriber 12/27/2023   Name of Prescriber Buckner Malta   Date Application Received From Patient  02/06/2024  Application Items Received From Patient  Application;Proof of Income;Other  Date Application Received From Provider  02/06/2024  Date Application Submitted to Manufacturer  02/06/2024  Method Application Sent to Manufacturer  Fax    Pattricia Boss, CPhT [place]  Office: [phone] Fax: [phone] Email: [email or web address]

## 2024-02-08 ENCOUNTER — Other Ambulatory Visit: Payer: Self-pay | Admitting: *Deleted

## 2024-02-08 ENCOUNTER — Telehealth: Payer: Self-pay | Admitting: Pharmacy Technician

## 2024-02-08 DIAGNOSIS — I6523 Occlusion and stenosis of bilateral carotid arteries: Secondary | ICD-10-CM

## 2024-02-08 DIAGNOSIS — Z5986 Financial insecurity: Secondary | ICD-10-CM

## 2024-02-08 NOTE — Progress Notes (Signed)
 Pharmacy Medication Assistance Program Note    02/08/2024  Patient ID: [name], female   DOB: 09/12/1946, 79 y.o.   MRN: [id number]     12/25/2023 02/06/2024 02/08/2024  Outreach Medication One  Initial Outreach Date (Medication One) 12/20/2023    Manufacturer Medication One [employer] [facility] Ozempic    Dose of Ozempic 4mg /22ml    Type of Radiographer, therapeutic Assistance    Date Application Sent to Patient 12/27/2023    Application Items Requested Application;Proof of Income;Other    Date Application Sent to Prescriber 12/27/2023    Name of Prescriber Buckner Malta    Date Application Received From Patient  02/06/2024   Application Items Received From Patient  Application;Proof of Income;Other   Date Application Received From Provider  02/06/2024   Date Application Submitted to Manufacturer  02/06/2024   Method Application Sent to Manufacturer  Fax   Patient Assistance Determination   Approved  Approval Start Date   02/07/2024  Approval End Date   11/27/2024  Patient Notification Method   Telephone Call  Telephone Call Outcome   Successful    Care coordination call placed to [employer] in regard to Ozempic.  Spoke to Psychologist, educational at [employer] in regard to [employer] application. She informs patient is APPROVED 02/06/24-11/27/24. Initial order and subsequent refill shipments will process automatically and be delivered to the prescriber's office. Patient may call [employer] at any time to check on shipments by calling [phone].  Successful outreach to patient. HIPAA verified. Patient was informed of her approval, refill procedure and expected delivery date of the medication. Patient verbalized understanding.  Will send in basket to [facility] PharmD Reynold Bowen informing her of this information as well as for case closure.

## 2024-02-12 ENCOUNTER — Ambulatory Visit: Payer: Medicare HMO | Admitting: Physician Assistant

## 2024-02-12 ENCOUNTER — Ambulatory Visit (HOSPITAL_COMMUNITY)
Admission: RE | Admit: 2024-02-12 | Discharge: 2024-02-12 | Disposition: A | Discharge status: Home / Self Care | Payer: Medicare HMO | Source: Ambulatory Visit | Attending: Surgery | Admitting: Surgery

## 2024-02-12 VITALS — BP 197/66 | HR 54 | Temp 98.3°F | Ht 63.0 in | Wt 139.2 lb

## 2024-02-12 DIAGNOSIS — I6523 Occlusion and stenosis of bilateral carotid arteries: Secondary | ICD-10-CM | POA: Diagnosis not present

## 2024-02-12 NOTE — Progress Notes (Signed)
 Office Note   History of Present Illness   [name] is a 78 y.o. (03-03-1946) female who presents for surveillance of carotid artery stenosis.  She has a history of left TCAR on 02/14/2020 by Dr. Myra Gianotti.  This was done for symptomatic left ICA stenosis with weakness and slurred speech.   She returns today for follow-up.  She says she is doing fairly well since her last follow-up with our office.  She denies any strokelike symptoms such as dizziness, sudden weakness/numbness, slurred speech, facial droop, or sudden visual changes.  She states she is currently trying to figure out from her PCP all the medications that she needs to be taking.  She currently does not know all of the medications that she is taking right now since she had to switch pharmacies.  She does know that she is taking a daily baby aspirin and medications for her asthma. Current Outpatient Medications  Medication Sig Dispense Refill   acetaminophen (TYLENOL) 325 MG tablet Take 2 tablets (650 mg total) by mouth every 6 (six) hours as needed for mild pain (or Fever >/= 101).     albuterol (VENTOLIN HFA) 108 (90 Base) MCG/ACT inhaler Inhale 2 puffs into the lungs every 6 (six) hours as needed for wheezing or shortness of breath.     alendronate (FOSAMAX) 70 MG tablet Take 70 mg by mouth once a week.     aspirin 81 MG EC tablet Take 1 tablet by mouth daily.     citalopram (CELEXA) 20 MG tablet Take 20 mg by mouth daily.     diclofenac (VOLTAREN) 25 MG EC tablet Take 25 mg by mouth daily.     EPINEPHrine 0.3 mg/0.3 mL IJ SOAJ injection Use as directed for life-threatening allergic reaction. 1 each 3   famotidine (PEPCID) 40 MG tablet TAKE 1 TABLET BY MOUTH EVERY DAY AT BEDTIME 30 tablet 5   fluticasone (FLONASE) 50 MCG/ACT nasal spray Place 1-2 sprays into both nostrils as needed for allergies or rhinitis.     ipratropium (ATROVENT) 0.06 % nasal spray INSTILL 1-2 SPRAYS IN EACH NOSTRIL EVERY 6 HOURS IF needed 15 mL 5    ipratropium-albuterol (DUONEB) 0.5-2.5 (3) MG/3ML SOLN Take 3 mLs by nebulization every 6 (six) hours as needed (shortness of breath/wheezing). 360 mL 1   levocetirizine (XYZAL) 5 MG tablet Take 5 mg by mouth at bedtime.      montelukast (SINGULAIR) 10 MG tablet Take 10 mg by mouth daily.     Na Sulfate-K Sulfate-Mg Sulf 17.5-3.13-1.6 GM/177ML SOLN Use as directed; may use generic; goodrx card if insurance will not cover generic 354 mL 0   omeprazole (PRILOSEC) 40 MG capsule Take 40 mg by mouth 2 (two) times daily.     Probiotic Product (PROBIOTIC DAILY PO) Take 1 capsule by mouth daily.      Semaglutide,0.25 or 0.5MG /DOS, (OZEMPIC, 0.25 OR 0.5 MG/DOSE,) 2 MG/1.5ML SOPN Inject 0.25 mg into the skin once a week.     Telmisartan-amLODIPine 80-5 MG TABS Take 1 tablet by mouth daily.     umeclidinium bromide (INCRUSE ELLIPTA) 62.5 MCG/ACT AEPB Inhale 1 puff into the lungs daily. 30 each 4   atorvastatin (LIPITOR) 40 MG tablet Take 40 mg by mouth at bedtime. (Patient not taking: Reported on 02/12/2024)     benzonatate (TESSALON) 100 MG capsule Take 1 capsule (100 mg total) by mouth 3 (three) times daily as needed for cough. (Patient not taking: Reported on 02/12/2024) 21 capsule 0  dicyclomine (BENTYL) 20 MG tablet Take 20 mg by mouth as needed for spasms. (Patient not taking: Reported on 02/12/2024)     Ferrous Sulfate (IRON PO) Take 1 tablet by mouth daily. (Patient not taking: Reported on 02/12/2024)     fluconazole (DIFLUCAN) 100 MG tablet Day 1 take 2 tablets then take 1 tablet daily until gone (Patient not taking: Reported on 02/12/2024) 15 tablet 0   fluticasone-salmeterol (ADVAIR) 250-50 MCG/ACT AEPB Inhale one puff twice daily to prevent cough or wheeze. Rinse mouth after use. 60 each 4   gabapentin (NEURONTIN) 100 MG capsule Take 100 mg by mouth as needed (arthritis pain). (Patient not taking: Reported on 02/12/2024)     metoprolol succinate (TOPROL-XL) 25 MG 24 hr tablet Take 25 mg by mouth at  bedtime. (Patient not taking: Reported on 02/12/2024)     tamsulosin (FLOMAX) 0.4 MG CAPS capsule Take 0.4 mg by mouth daily. (Patient not taking: Reported on 02/12/2024)     Current Facility-Administered Medications  Medication Dose Route Frequency Provider Last Rate Last Admin   tezepelumab-ekko (TEZSPIRE) 210 MG/1. syringe 210 mg  210 mg Subcutaneous Q28 days Jessica Priest, MD   210 mg at 02/01/24 1017    REVIEW OF SYSTEMS (negative unless checked):   Cardiac:  []  Chest pain or chest pressure? []  Shortness of breath upon activity? []  Shortness of breath when lying flat? []  Irregular heart rhythm?  Vascular:  []  Pain in calf, thigh, or hip brought on by walking? []  Pain in feet at night that wakes you up from your sleep? []  Blood clot in your veins? []  Leg swelling?  Pulmonary:  []  Oxygen at home? []  Productive cough? []  Wheezing?  Neurologic:  []  Sudden weakness in arms or legs? []  Sudden numbness in arms or legs? []  Sudden onset of difficult speaking or slurred speech? []  Temporary loss of vision in one eye? []  Problems with dizziness?  Gastrointestinal:  []  Blood in stool? []  Vomited blood?  Genitourinary:  []  Burning when urinating? []  Blood in urine?  Psychiatric:  []  Major depression  Hematologic:  []  Bleeding problems? []  Problems with blood clotting?  Dermatologic:  []  Rashes or ulcers?  Constitutional:  []  Fever or chills?  Ear/Nose/Throat:  []  Change in hearing? []  Nose bleeds? []  Sore throat?  Musculoskeletal:  []  Back pain? []  Joint pain? []  Muscle pain?   Physical Examination   Vitals:   02/12/24 1304 02/12/24 1307  BP: (!) 194/68 (!) 197/66  Pulse: (!) 54   Temp: 98.3 F (36.8 C)   TempSrc: Temporal   SpO2: 98%   Weight: 139 lb 3.2 oz (63.1 kg)   Height: 5\' 3"  (1.6 m)    Body mass index is 24.66 kg/m.  General:  WDWN in NAD; vital signs documented above Gait: Not observed HENT: WNL, normocephalic Pulmonary: normal  non-labored breathing , without rales, rhonchi,  wheezing Cardiac: regular, without carotid bruit Abdomen: soft, NT, no masses Skin: without rashes Vascular Exam/Pulses: palpable radial pulses bilaterally Extremities: without ischemic changes, without gangrene , without cellulitis; without open wounds;  Musculoskeletal: no muscle wasting or atrophy  Neurologic: A&O X 3;  No focal weakness or paresthesias are detected Psychiatric:  The pt has Normal affect.  Non-Invasive Vascular Imaging   Bilateral Carotid Duplex (02/12/2024):  R ICA stenosis:  1-39% R VA:  patent and antegrade L ICA stenosis:   Patent stent L VA:  patent and antegrade   Medical Decision Making   ASHEA WINIARSKI is  a 78 y.o. female who presents for surveillance of carotid artery stenosis  Based on the patient's vascular studies, her carotid artery stenosis is unchanged bilaterally.  Her right ICA stenosis is 1 to 39%.  She has a patent left ICA stent without restenosis She denies any strokelike symptoms such as slurred speech, facial droop, sudden visual changes, or sudden weakness/numbness.  She has no neurodeficits on exam.  She has no carotid bruits on exam.  She has palpable and equal radial pulses At this time the patient is unsure all of the medications that she is taking right now.  She is waiting for an appointment with her PCP to discuss her medication regime.  I have discussed with the patient from our standpoint it is important that she continues a daily baby aspirin and statin. She can follow-up with our office in 1 year with repeat carotid duplex   Loel Dubonnet PA-C [facility] Office: [phone]  Clinic MD: Myra Gianotti

## 2024-02-15 NOTE — Progress Notes (Signed)
 Cardiology Office Note:  .   Date:  02/16/2024  ID:  [name], DOB 08-05-1946, MRN [id number] PCP: Buckner Malta, MD  [facility] Providers Cardiologist:  Garwin Brothers, MD    History of Present Illness: .   [name] is a 78 y.o. female with a past medical history of carotid artery stenosis s/p TCAR 2021, hypertension, aortic stenosis, mitral regurgitation, obstructive lung disease, OSA, GERD, IBS, DM2, dyslipidemia, sarcoidosis.  02/12/2024 carotid duplex right ICA 1 to 39% stenosis, left ICA patent stent 02/14/2020 left TCAR Dr. Myra Gianotti 07/02/2019 echo EF 60 to 65%, mild thickening of the mitral valve with mild to moderate regurgitation, mild stenosis of the aortic valve  Most recently evaluated by Dr. Tomie China on 05/22/2023, no changes were made to her medications or plan of care and she was advised to follow-up in 9 months.  She presents today for follow-up.  Spent a lot of confusion surrounding her medication regimen lately, she is not entirely clear on what she is taking.  Her blood pressure is elevated in the office today, was also apparently elevated when she was evaluated at a previous OV as well.  She also mentions frequent falls, episodes of dizziness as well as DOE.  Her EKG appears to reveal ectopic atrial rhythm.  She has a poor historian.  She denies chest pain,pnd, orthopnea, n, v, syncope, edema, weight gain, or early satiety.   ROS: Review of Systems  Cardiovascular:  Positive for palpitations.  Musculoskeletal:  Positive for falls.  Neurological:  Positive for dizziness.  All other systems reviewed and are negative.         Studies Reviewed: .        Cardiac Studies & Procedures   ______________________________________________________________________________________________     ECHOCARDIOGRAM  ECHOCARDIOGRAM COMPLETE 07/02/2019  Narrative ECHOCARDIOGRAM REPORT    Patient Name:   [name] Date of Exam: 07/02/2019 Medical  Rec #:  [id number]        Height:       60.0 in Accession #:    [id number]       Weight:       172.6 lb Date of Birth:  10/17/1946       BSA:          1.75 m Patient Age:    72 years         BP:           142/70 mmHg Patient Gender: F                HR:           66 bpm. Exam Location:  [place]   Procedure: 2D Echo  Indications:    Dyspnea  History:        Patient has prior history of Echocardiogram examinations, most recent 01/12/2018. Sarcoidoses Signs/Symptoms: Dyspnea and Murmur Risk Factors: Diabetes, Hypertension and Dyslipidemia.  Sonographer:    Sinda Du RDCS (AE) Referring Phys: Posey Pronto Aundra Dubin [facility]  IMPRESSIONS   1. The left ventricle has normal systolic function with an ejection fraction of 60-65%. The cavity size was normal. Left ventricular diastolic Doppler parameters are consistent with pseudonormalization. 2. The right ventricle has normal systolic function. The cavity was normal. There is no increase in right ventricular wall thickness. 3. The mitral valve is degenerative. Mild thickening of the mitral valve leaflet. Mild calcification of the mitral valve leaflet. Mitral valve regurgitation is mild to moderate by color flow Doppler. 4. The  tricuspid valve is grossly normal. 5. The aortic valve is abnormal. Mild calcification of the aortic valve. Aortic valve regurgitation is mild by color flow Doppler. Mild stenosis of the aortic valve. 6. The aorta is normal in size and structure. 7. The inferior vena cava was dilated in size with >50% respiratory variability.  FINDINGS Left Ventricle: The left ventricle has normal systolic function, with an ejection fraction of 60-65%. The cavity size was normal. There is no increase in left ventricular wall thickness. Left ventricular diastolic Doppler parameters are consistent with pseudonormalization.  Right Ventricle: The right ventricle has normal systolic function. The cavity was normal. There is no increase in  right ventricular wall thickness.  Left Atrium: Left atrial size was normal in size.  Right Atrium: Right atrial size was normal in size. Right atrial pressure is estimated at 8 mmHg.  Interatrial Septum: No atrial level shunt detected by color flow Doppler.  Pericardium: There is no evidence of pericardial effusion.  Mitral Valve: The mitral valve is degenerative in appearance. Mild thickening of the mitral valve leaflet. Mild calcification of the mitral valve leaflet. Mitral valve regurgitation is mild to moderate by color flow Doppler.  Tricuspid Valve: The tricuspid valve is grossly normal. Tricuspid valve regurgitation is mild by color flow Doppler.  Aortic Valve: The aortic valve is abnormal Mild calcification of the aortic valve. Aortic valve regurgitation is mild by color flow Doppler. There is Mild stenosis of the aortic valve, with a calculated valve area of 0.79 cm.  Pulmonic Valve: The pulmonic valve was not well visualized. Pulmonic valve regurgitation was not assessed by color flow Doppler.  Aorta: The aorta is normal in size and structure.  Venous: The inferior vena cava measures 2.34 cm, is dilated in size with greater than 50% respiratory variability.   +--------------+--------++ LEFT VENTRICLE         +----------------+---------++ +--------------+--------++ Diastology                PLAX 2D                +----------------+---------++ +--------------+--------++ LV e' lateral:  9.40 cm/s LVIDd:        5.18 cm  +----------------+---------++ +--------------+--------++ LV E/e' lateral:11.6      LVIDs:        3.39 cm  +----------------+---------++ +--------------+--------++ LV e' medial:   6.20 cm/s LV PW:        1.15 cm  +----------------+---------++ +--------------+--------++ LV E/e' medial: 17.6      LV IVS:       0.99 cm  +----------------+---------++ +--------------+--------++ LVOT diam:    1.50 cm   +--------------+--------++ LV SV:        81 ml    +--------------+--------++ LV SV Index:  43.76    +--------------+--------++ LVOT Area:    1.77 cm +--------------+--------++                        +--------------+--------++  +---------------+----------++ RIGHT VENTRICLE           +---------------+----------++ RV Basal diam: 3.06 cm    +---------------+----------++ RV S prime:    10.10 cm/s +---------------+----------++ TAPSE (M-mode):2.6 cm     +---------------+----------++  +-------------+-------++-----------++ LEFT ATRIUM         Index       +-------------+-------++-----------++ LA diam:     3.90 cm2.22 cm/m  +-------------+-------++-----------++ LA Vol (A2C):47.2 ml26.92 ml/m +-------------+-------++-----------++ LA Vol (A4C):75.8 ml43.23 ml/m +-------------+-------++-----------++ +------------+---------++-----------++ RIGHT ATRIUM  Index       +------------+---------++-----------++ RA Area:    18.40 cm            +------------+---------++-----------++ RA Volume:  53.00 ml 30.23 ml/m +------------+---------++-----------++ +------------------+------------++ AORTIC VALVE                   +------------------+------------++ AV Area (Vmax):   0.83 cm     +------------------+------------++ AV Area (Vmean):  0.77 cm     +------------------+------------++ AV Area (VTI):    0.79 cm     +------------------+------------++ AV Vmax:          263.33 cm/s  +------------------+------------++ AV Vmean:         195.333 cm/s +------------------+------------++ AV VTI:           0.666 m      +------------------+------------++ AV Peak Grad:     27.7 mmHg    +------------------+------------++ AV Mean Grad:     17.3 mmHg    +------------------+------------++ LVOT Vmax:        123.00 cm/s  +------------------+------------++ LVOT Vmean:        84.700 cm/s  +------------------+------------++ LVOT VTI:         0.297 m      +------------------+------------++ LVOT/AV VTI ratio:0.45         +------------------+------------++ AR PHT:           492 msec     +------------------+------------++  +-------------+-------++ AORTA                +-------------+-------++ Ao Root diam:2.40 cm +-------------+-------++ Ao Asc diam: 3.30 cm +-------------+-------++  +--------------+-----------++ +---------------+-----------++ MITRAL VALVE              TRICUSPID VALVE            +--------------+-----------++ +---------------+-----------++ MV Area (PHT):1.83 cm    TR Peak grad:  41.0 mmHg   +--------------+-----------++ +---------------+-----------++ MV PHT:       120.35 msec TR Vmax:       345.00 cm/s +--------------+-----------++ +---------------+-----------++ MV Decel Time:415 msec    +--------------+-----------++ +--------------+-------+ +--------------+-----------++ SHUNTS                MV E velocity:109.00 cm/s +--------------+-------+ +--------------+-----------++ Systemic VTI: 0.30 m  MV A velocity:49.00 cm/s  +--------------+-------+ +--------------+-----------++ Systemic Diam:1.50 cm MV E/A ratio: 2.22        +--------------+-------+ +--------------+-----------++  +---------+-------+ IVC              +---------+-------+ IVC diam:2.34 cm +---------+-------+   Belva Crome MD Electronically signed by Belva Crome MD Signature Date/Time: 07/03/2019/12:18:39 PM    Final          ______________________________________________________________________________________________      Risk Assessment/Calculations:     HYPERTENSION CONTROL Vitals:   02/16/24 0959 02/16/24 1354  BP: (!) 160/70 (!) 168/70    The patient's blood pressure is elevated above target today.  In order to address the patient's elevated BP: A current  anti-hypertensive medication was adjusted today.          Physical Exam:   VS:  BP (!) 168/70   Pulse (!) 58   Ht 5\' 3"  (1.6 m)   Wt 138 lb (62.6 kg)   SpO2 99%   BMI 24.45 kg/m    Wt Readings from Last 3 Encounters:  02/16/24 138 lb (62.6 kg)  02/12/24 139 lb 3.2 oz (63.1 kg)  12/28/23 127 lb (57.6 kg)    GEN: Well nourished, well developed in no acute distress NECK: No JVD; No  carotid bruits CARDIAC: RRR, no murmurs, rubs, gallops RESPIRATORY:  Clear to auscultation without rales, wheezing or rhonchi  ABDOMEN: Soft, non-tender, non-distended EXTREMITIES:  No edema; No deformity   ASSESSMENT AND PLAN: .   Carotid artery stenosis- s/p TCAR 2021>> duplex this year revealed right ICA 1 to 39% stenosis, left ICA patent stent.  Continue aspirin 81 mg daily, will refill her Lipitor 40 mg daily.  Hypertension-blood pressure is elevated 160/70, upon reviewing epic records it was also very elevated at her [facility] appointment at 197/66.  Will increase her telmisartan-Norvasc 80-10 mg daily.  Recent creatinine 0.68, potassium 4.1.  Dyslipidemia-it appears she has been out of her Lipitor, we will refill this for her, not clear how long she has been out of it. Repeat CMET and DLDL.     Mitral regurgitation-mild to moderate per most recent echo in 2020, she has some exertional dyspnea that is hard for explain.  Will repeat echocardiogram for any contributory causes.  Junctional rhythm-her EKG today reveals ectopic atrial rhythm, will arrange for 3-day monitor.  Dispo: Monitor, echo, increase Telmisartan-amlodipine to 80-10 mg daily, labs per above.   Signed, Flossie Dibble, NP

## 2024-02-16 ENCOUNTER — Encounter: Payer: Self-pay | Admitting: Cardiology

## 2024-02-16 ENCOUNTER — Ambulatory Visit

## 2024-02-16 ENCOUNTER — Ambulatory Visit: Payer: Medicare Other | Attending: Cardiology | Admitting: Cardiology

## 2024-02-16 VITALS — BP 168/70 | HR 58 | Ht 63.0 in | Wt 138.0 lb

## 2024-02-16 DIAGNOSIS — I10 Essential (primary) hypertension: Secondary | ICD-10-CM

## 2024-02-16 DIAGNOSIS — I350 Nonrheumatic aortic (valve) stenosis: Secondary | ICD-10-CM | POA: Diagnosis not present

## 2024-02-16 DIAGNOSIS — E782 Mixed hyperlipidemia: Secondary | ICD-10-CM

## 2024-02-16 DIAGNOSIS — I340 Nonrheumatic mitral (valve) insufficiency: Secondary | ICD-10-CM

## 2024-02-16 DIAGNOSIS — R011 Cardiac murmur, unspecified: Secondary | ICD-10-CM

## 2024-02-16 DIAGNOSIS — I6523 Occlusion and stenosis of bilateral carotid arteries: Secondary | ICD-10-CM | POA: Diagnosis not present

## 2024-02-16 MED ORDER — TELMISARTAN-AMLODIPINE 80-10 MG PO TABS: 1.0000 | ORAL_TABLET | Freq: Every day | ORAL | 1 refills | Status: DC

## 2024-02-16 NOTE — Patient Instructions (Signed)
 Medication Instructions:   Increase Telmisartan-Amlodipine to 80-10 mg once a day.  *If you need a refill on your cardiac medications before your next appointment, please call your pharmacy*   Lab Work: Your physician recommends that you have labs done in the office today. Your test included  CMP, and direct LDL.   If you have labs (blood work) drawn today and your tests are completely normal, you will receive your results only by: MyChart Message (if you have MyChart) OR A paper copy in the mail If you have any lab test that is abnormal or we need to change your treatment, we will call you to review the results.   Testing/Procedures: Echocardiogram An echocardiogram is a test that uses sound waves (ultrasound) to produce images of the heart. Images from an echocardiogram can provide important information about: Heart size and shape. The size and thickness and movement of your heart's walls. Heart muscle function and strength. Heart valve function or if you have stenosis. Stenosis is when the heart valves are too narrow. If blood is flowing backward through the heart valves (regurgitation). A tumor or infectious growth around the heart valves. Areas of heart muscle that are not working well because of poor blood flow or injury from a heart attack. Aneurysm detection. An aneurysm is a weak or damaged part of an artery wall. The wall bulges out from the normal force of blood pumping through the body. Tell a health care provider about: Any allergies you have. All medicines you are taking, including vitamins, herbs, eye drops, creams, and over-the-counter medicines. Any blood disorders you have. Any surgeries you have had. Any medical conditions you have. Whether you are pregnant or may be pregnant. What are the risks? Generally, this is a safe test. However, problems may occur, including an allergic reaction to dye (contrast) that may be used during the test. What happens before the  test? No specific preparation is needed. You may eat and drink normally. What happens during the test?  You will take off your clothes from the waist up and put on a hospital gown. Electrodes or electrocardiogram (ECG)patches may be placed on your chest. The electrodes or patches are then connected to a device that monitors your heart rate and rhythm. You will lie down on a table for an ultrasound exam. A gel will be applied to your chest to help sound waves pass through your skin. A handheld device, called a transducer, will be pressed against your chest and moved over your heart. The transducer produces sound waves that travel to your heart and bounce back (or "echo" back) to the transducer. These sound waves will be captured in real-time and changed into images of your heart that can be viewed on a video monitor. The images will be recorded on a computer and reviewed by your health care provider. You may be asked to change positions or hold your breath for a short time. This makes it easier to get different views or better views of your heart. In some cases, you may receive contrast through an IV in one of your veins. This can improve the quality of the pictures from your heart. The procedure may vary among health care providers and hospitals. What can I expect after the test? You may return to your normal, everyday life, including diet, activities, and medicines, unless your health care provider tells you not to do that. Follow these instructions at home: It is up to you to get the results of your  test. Ask your health care provider, or the department that is doing the test, when your results will be ready. Keep all follow-up visits. This is important. Summary An echocardiogram is a test that uses sound waves (ultrasound) to produce images of the heart. Images from an echocardiogram can provide important information about the size and shape of your heart, heart muscle function, heart valve  function, and other possible heart problems. You do not need to do anything to prepare before this test. You may eat and drink normally. After the echocardiogram is completed, you may return to your normal, everyday life, unless your health care provider tells you not to do that. This information is not intended to replace advice given to you by your health care provider. Make sure you discuss any questions you have with your health care provider. Document Revised: 07/28/2021 Document Reviewed: 07/07/2020 Elsevier Patient Education  2023 [employer]vier Inc.     A zio monitor was ordered today. It will remain on for 3 days. Remove 02/19/2024. You will then return monitor and event diary in provided box. It takes 1-2 weeks for report to be downloaded and returned to [place]. We will call you with the results. If monitor falls off or has orange flashing light, please call Zio for further instructions.     Follow-Up: At [facility], you and your health needs are our priority.  As part of our continuing mission to provide you with exceptional heart care, we have created designated Provider Care Teams.  These Care Teams include your primary Cardiologist (physician) and Advanced Practice Providers (APPs -  Physician Assistants and Nurse Practitioners) who all work together to provide you with the care you need, when you need it.  We recommend signing up for the patient portal called "MyChart".  Sign up information is provided on this After Visit Summary.  MyChart is used to connect with patients for Virtual Visits (Telemedicine).  Patients are able to view lab/test results, encounter notes, upcoming appointments, etc.  Non-urgent messages can be sent to your provider as well.   To learn more about what you can do with MyChart, go to [email or web address].    Your next appointment:   6 month follow up

## 2024-02-17 LAB — COMPREHENSIVE METABOLIC PANEL WITH GFR
ALT: 7 IU/L (ref 0–32)
AST: 17 IU/L (ref 0–40)
Albumin: 3.8 g/dL (ref 3.8–4.8)
Alkaline Phosphatase: 112 IU/L (ref 44–121)
BUN/Creatinine Ratio: 26 (ref 12–28)
BUN: 22 mg/dL (ref 8–27)
Bilirubin Total: 0.4 mg/dL (ref 0.0–1.2)
CO2: 23 mmol/L (ref 20–29)
Calcium: 9.5 mg/dL (ref 8.7–10.3)
Chloride: 104 mmol/L (ref 96–106)
Creatinine, Ser: 0.86 mg/dL (ref 0.57–1.00)
Globulin, Total: 2.3 g/dL (ref 1.5–4.5)
Glucose: 71 mg/dL (ref 70–99)
Potassium: 4.8 mmol/L (ref 3.5–5.2)
Sodium: 142 mmol/L (ref 134–144)
Total Protein: 6.1 g/dL (ref 6.0–8.5)
eGFR: 70 mL/min/1.73

## 2024-02-17 LAB — LDL CHOLESTEROL, DIRECT: LDL Direct: 109 mg/dL — ABNORMAL HIGH (ref 0–99)

## 2024-02-19 ENCOUNTER — Telehealth: Payer: Self-pay | Admitting: Emergency Medicine

## 2024-02-19 DIAGNOSIS — I6523 Occlusion and stenosis of bilateral carotid arteries: Secondary | ICD-10-CM

## 2024-02-19 NOTE — Telephone Encounter (Signed)
  Called and spoke to patient. Reviewed lab results as per Jimmy Footman, NP note. Reviewed recommendation to have labs repeated in 8 weeks. Patient verbalized understanding and had no further questions.

## 2024-02-19 NOTE — Telephone Encounter (Signed)
-----   Message from [name] sent at 02/19/2024  8:08 AM EDT ----- Cholesterol is elevated, normal LFTs. Recommend she start her prior dose of Lipitor (which we called in at last OV).  Repeat FLP and LFTs in 8 weeks.

## 2024-02-29 ENCOUNTER — Ambulatory Visit (INDEPENDENT_AMBULATORY_CARE_PROVIDER_SITE_OTHER): Admitting: *Deleted

## 2024-02-29 DIAGNOSIS — J4550 Severe persistent asthma, uncomplicated: Secondary | ICD-10-CM

## 2024-02-29 DIAGNOSIS — R350 Frequency of micturition: Secondary | ICD-10-CM | POA: Diagnosis not present

## 2024-02-29 DIAGNOSIS — I10 Essential (primary) hypertension: Secondary | ICD-10-CM | POA: Diagnosis not present

## 2024-02-29 DIAGNOSIS — N3946 Mixed incontinence: Secondary | ICD-10-CM | POA: Diagnosis not present

## 2024-02-29 DIAGNOSIS — R011 Cardiac murmur, unspecified: Secondary | ICD-10-CM | POA: Diagnosis not present

## 2024-03-04 ENCOUNTER — Telehealth: Payer: Self-pay | Admitting: Pharmacist

## 2024-03-04 NOTE — Progress Notes (Signed)
 Contacted patient regarding referral for medication access and medication management from Buckner Malta, MD .   Appointment scheduled   Reynold Bowen, PharmD Clinical Pharmacist [place] Direct Dial: [phone]

## 2024-03-04 NOTE — Progress Notes (Deleted)
 Attempted to contact patient for medication adherence and medication reconciliation. Patient's phone is out of service at this time.

## 2024-03-05 ENCOUNTER — Other Ambulatory Visit: Payer: Self-pay | Admitting: Pharmacist

## 2024-03-05 ENCOUNTER — Telehealth: Payer: Self-pay | Admitting: Pharmacist

## 2024-03-05 NOTE — Progress Notes (Signed)
 Attempted to contact patient for scheduled appointment for medication management. Left HIPAA compliant message for patient to return my call at their convenience.   Reynold Bowen, PharmD Clinical Pharmacist [place] Direct Dial: [phone]

## 2024-03-05 NOTE — Progress Notes (Signed)
 03/05/2024 Name: [name] MRN: [id number] DOB: 04-19-1946  Chief Complaint  Patient presents with   Medication Management    [name] is a 78 y.o. year old female who presented for a telephone visit.   They were referred to the pharmacist by their PCP for assistance in managing medication access and complex medication management.    Subjective:  Care Team: Primary Care Provider: Buckner Malta, MD ; Next Scheduled Visit: 03/07/2024 11:30 AM   Medication Access/Adherence  Current Pharmacy:  [facility] [address] [place] - [address] [place] [address] Phone: [phone] Fax: [phone]  [place] [facility] [address] [place] [address] Phone: [phone] Fax: [phone]   Patient reports affordability concerns with their medications: Yes  Patient reports access/transportation concerns to their pharmacy: No  Patient reports adherence concerns with their medications:  Yes  cost   Asthma:  Current medications:   Incruse Ellipta one inhalation daily Medications tried in the past:   Reports multiple exacerbations in the past year  Current medication access support: Trying to reach the $300 out of pocket requirement for [employer]    Objective:  Lab Results  Component Value Date   HGBA1C 6.0 (H) 02/16/2018    Lab Results  Component Value Date   CREATININE 0.86 02/16/2024   BUN 22 02/16/2024   NA 142 02/16/2024   K 4.8 02/16/2024   CL 104 02/16/2024   CO2 23 02/16/2024    Lab Results  Component Value Date   CHOL 139 07/21/2020   HDL 59 07/21/2020   LDLCALC 63 07/21/2020   LDLDIRECT 109 (H) 02/16/2024   TRIG 92 07/21/2020   CHOLHDL 2.4 07/21/2020    Medications Reviewed Today     Reviewed by Marisa Hua, RPH (Pharmacist) on 03/05/24 at 1524  Med List Status: <None>   Medication Order Taking? Sig Documenting Provider Last Dose Status Informant   acetaminophen (TYLENOL) 325 MG tablet [id number] Yes Take 2 tablets (650 mg total) by mouth every 6 (six) hours as needed for mild pain (or Fever >/= 101). Zannie Cove, MD Taking Active   albuterol (VENTOLIN HFA) 108 [phone] Base) MCG/ACT inhaler [id number] Yes Inhale 2 puffs into the lungs every 6 (six) hours as needed for wheezing or shortness of breath. [provider] Taking Active   alendronate (FOSAMAX) 70 MG tablet [id number] Yes Take 70 mg by mouth once a week. [provider] Taking Active   aspirin 81 MG EC tablet [id number] Yes Take 1 tablet by mouth daily. [provider] Taking Active   atorvastatin (LIPITOR) 40 MG tablet [id number] No Take 40 mg by mouth at bedtime.  Patient not taking: Reported on 02/12/2024   [provider] Not Taking Active   citalopram (CELEXA) 20 MG tablet [id number] Yes Take 20 mg by mouth daily. [provider] Taking Active Other  diclofenac (VOLTAREN) 25 MG EC tablet [id number] Yes Take 25 mg by mouth daily as needed. [provider] Taking Active Self  famotidine (PEPCID) 40 MG tablet [id number] Yes TAKE 1 TABLET BY MOUTH EVERY DAY AT BEDTIME Kozlow, Alvira Philips, MD Taking Active   ipratropium-albuterol (DUONEB) 0.5-2.5 (3) MG/3ML SOLN [id number] Yes Take 3 mLs by nebulization every 6 (six) hours as needed (shortness of breath/wheezing). Kozlow, Alvira Philips, MD Taking Active   levocetirizine (XYZAL) 5 MG tablet [id number] No Take 5 mg by mouth at bedtime.   Patient not taking: Reported  on 03/05/2024   [provider] Not Taking Active Other  metoprolol succinate (TOPROL-XL) 25 MG 24 hr tablet [id number] No Take 25 mg by mouth at bedtime.  Patient not taking: Reported on 02/12/2024   [provider] Not Taking Active   montelukast (SINGULAIR) 10 MG tablet [id number] No Take 10 mg by mouth daily.  Patient not taking: Reported on 03/05/2024   [provider] Not Taking Active Other  mupirocin ointment  (BACTROBAN) 2 % [id number] Yes Apply 1 Application topically 2 (two) times daily. X 10 days [provider] Taking Active   omeprazole (PRILOSEC) 40 MG capsule [id number] Yes Take 40 mg by mouth 2 (two) times daily. [provider] Taking Active   Probiotic Product (PROBIOTIC DAILY PO) [id number] No Take 1 capsule by mouth daily.   Patient not taking: Reported on 03/05/2024   [provider] Not Taking Active Other  Semaglutide,0.25 or 0.5MG /DOS, (OZEMPIC, 0.25 OR 0.5 MG/DOSE,) 2 MG/1.5ML SOPN [id number] Yes Inject 0.25 mg into the skin once a week. Imogene Burn, MD Taking Active   Telmisartan-amLODIPine 80-10 MG TABS [id number] Yes Take 1 tablet by mouth daily. Flossie Dibble, NP Taking Active   tezepelumab-ekko (TEZSPIRE) 210 MG/1. syringe 210 mg [id number]   Jessica Priest, MD  Active   umeclidinium bromide (INCRUSE ELLIPTA) 62.5 MCG/ACT AEPB [id number] Yes Inhale 1 puff into the lungs daily. Kozlow, Alvira Philips, MD Taking Active               Assessment/Plan:   Telephonic engagement with [name] today for medication reconciliation. Patient reports being out of some of her medications recently due to change in pharmacy.   Medications that patient has and needs refills sent to [facility]:  Alendronate 70 mg 1 tablet PO weekly Citalopram 20 mg 1 tablet PO daily  Omeprazole 40 mg 1  capsule PO BID Famotidine 40 mg 1 tablet PO daily   Medications that patient does NOT have and needs: Montelukast 10 mg 1 tablet PO daily  Levocetirizine 5 mg 1 tablet PO daily  Atorvastatin 40 mg 1 tablet PO daily   Medications that patient is taking and has:  Aspirin 81 mg 1 tablet PO daily  PreserVision AREDS  Acetaminophen 8 hour 650 mg tablets Take 1 to 2 tablets every 8 hours as needed for pain Albuterol sulfate (Ventolin) HFA  2 puffs every 6 hours PRN SOB/wheezing  Albuterol sulfate 1.25 mg/59mL solution for nebulization 3 mL Q6 hours PRN until symptoms  improve Nebulizer compressor and accessories  Incruse Ellipta 62.5 mcg/ACT 1 inhalation daily (pulmonology)  Imodium A-D 2 mg 1 tablet PO QID PRN diarrhea  Diclofenac sodium 75 mg 1 tablet PO daily PRN pain Telmisartan-amlodipine 80-10 mg 1 tablet PO daily (cardiology)  Gemtesa 75 mg 1 tablet PO daily  Mupirocin 2% BID to sores under nose x 10 days    Patient reports no longer taking the following, removed from medication list today:  Acidophilus 100 mg capsule 1 PO daily  Vitamin D 1000 international units  1 capsule daily  Metoprolol succinate 25 mg 1 tablet PO daily  Pioglitazone 15 mg 1 tablet PO daily  Gabapentin 100 mg 1 capsule PO BID PRN pain in buttock/right hip   Follow Up Plan: 03/07/2024  Reynold Bowen, PharmD Clinical Pharmacist [place] Direct Dial: [phone]

## 2024-03-06 ENCOUNTER — Ambulatory Visit: Admitting: Orthopedic Surgery

## 2024-03-06 DIAGNOSIS — M1712 Unilateral primary osteoarthritis, left knee: Secondary | ICD-10-CM | POA: Diagnosis not present

## 2024-03-06 DIAGNOSIS — M1711 Unilateral primary osteoarthritis, right knee: Secondary | ICD-10-CM | POA: Diagnosis not present

## 2024-03-06 DIAGNOSIS — I10 Essential (primary) hypertension: Secondary | ICD-10-CM | POA: Diagnosis not present

## 2024-03-06 DIAGNOSIS — R011 Cardiac murmur, unspecified: Secondary | ICD-10-CM

## 2024-03-07 ENCOUNTER — Ambulatory Visit: Attending: Cardiology

## 2024-03-07 ENCOUNTER — Telehealth: Payer: Self-pay | Admitting: Emergency Medicine

## 2024-03-07 DIAGNOSIS — D860 Sarcoidosis of lung: Secondary | ICD-10-CM | POA: Diagnosis not present

## 2024-03-07 DIAGNOSIS — E1165 Type 2 diabetes mellitus with hyperglycemia: Secondary | ICD-10-CM | POA: Diagnosis not present

## 2024-03-07 DIAGNOSIS — G4733 Obstructive sleep apnea (adult) (pediatric): Secondary | ICD-10-CM | POA: Diagnosis not present

## 2024-03-07 DIAGNOSIS — E1169 Type 2 diabetes mellitus with other specified complication: Secondary | ICD-10-CM | POA: Diagnosis not present

## 2024-03-07 DIAGNOSIS — N1831 Chronic kidney disease, stage 3a: Secondary | ICD-10-CM | POA: Diagnosis not present

## 2024-03-07 DIAGNOSIS — E785 Hyperlipidemia, unspecified: Secondary | ICD-10-CM | POA: Diagnosis not present

## 2024-03-07 DIAGNOSIS — R011 Cardiac murmur, unspecified: Secondary | ICD-10-CM | POA: Diagnosis not present

## 2024-03-07 DIAGNOSIS — I10 Essential (primary) hypertension: Secondary | ICD-10-CM

## 2024-03-07 DIAGNOSIS — Z0000 Encounter for general adult medical examination without abnormal findings: Secondary | ICD-10-CM | POA: Diagnosis not present

## 2024-03-07 NOTE — Telephone Encounter (Signed)
-----   Message from Flossie Dibble sent at 03/07/2024  8:24 AM EDT ----- Your monitor revealed that you do have episodes where your heart starts to speed up.  Are you currently taking metoprolol?

## 2024-03-07 NOTE — Telephone Encounter (Signed)
 Called patient and left a voicemail

## 2024-03-08 ENCOUNTER — Encounter: Payer: Self-pay | Admitting: Orthopedic Surgery

## 2024-03-08 LAB — ECHOCARDIOGRAM COMPLETE
AR max vel: 1.18 cm2
AV Area VTI: 1.22 cm2
AV Area mean vel: 1.13 cm2
AV Mean grad: 25.2 mmHg
AV Peak grad: 44.3 mmHg
AV Vena cont: 0.2 cm
Ao pk vel: 3.33 m/s
Area-P 1/2: 2.48 cm2
MV M vel: 6.71 m/s
MV Peak grad: 180.1 mmHg
P 1/2 time: 381 ms
Radius: 0.5 cm
S' Lateral: 3.1 cm

## 2024-03-08 MED ORDER — FUROSEMIDE 20 MG PO TABS: 20.0000 mg | ORAL_TABLET | Freq: Every day | ORAL | 3 refills | Status: DC

## 2024-03-08 MED ORDER — METOPROLOL SUCCINATE ER 25 MG PO TB24: 25.0000 mg | ORAL_TABLET | Freq: Every day | ORAL | 1 refills | Status: DC

## 2024-03-08 NOTE — Progress Notes (Signed)
 Office Visit Note   Patient: [name]           Date of Birth: 1946/08/16           MRN: [id number] Visit Date: 03/06/2024 Requested by: Harvest Lineman, MD [address] [place],  [place] [address] PCP: Harvest Lineman, MD  Subjective: Chief Complaint  Patient presents with   Right Knee - Pain   Left Knee - Pain    HPI: [name] is a 78 y.o. female who presents to the office reporting continued bilateral knee pain.  She had bilateral knee injections in September.  The left hip injection in January of this year with Dr. Daisey Dryer helped.  Patient states "I do not want surgery".  Still hard for her to walk and hard for her to get up.  Clarisa reports pain behind both knees..                ROS: All systems reviewed are negative as they relate to the chief complaint within the history of present illness.  Patient denies fevers or chills.  Assessment & Plan: Visit Diagnoses:  1. Primary osteoarthritis of right knee   2. Unilateral primary osteoarthritis, left knee     Plan: Impression is bilateral knee pain with arthritis.  Does have flexion contractures consistent with rather significant arthritis.  Plan at this time is bilateral knee injections.  Come back in October for clinical recheck and possible repeat intervention at that time.  Continue with nonweightbearing quad strengthening exercises.  Follow-Up Instructions: No follow-ups on file.   Orders:  No orders of the defined types were placed in this encounter.  No orders of the defined types were placed in this encounter.     Procedures: Large Joint Inj: bilateral knee on 03/06/2024 9:00 PM Indications: diagnostic evaluation, joint swelling and pain Details: 18 G 1.5 in needle, superolateral approach  Arthrogram: No  Medications (Right): 5 mL lidocaine 1 %; 4 mL bupivacaine 0.25 %; 40 mg methylPREDNISolone acetate 40 MG/ML Medications (Left): 5 mL lidocaine 1 %; 4 mL bupivacaine 0.25 %; 40 mg  methylPREDNISolone acetate 40 MG/ML Outcome: tolerated well, no immediate complications Procedure, treatment alternatives, risks and benefits explained, specific risks discussed. Consent was given by the patient. Immediately prior to procedure a time out was called to verify the correct patient, procedure, equipment, support staff and site/side marked as required. Patient was prepped and draped in the usual sterile fashion.       Clinical Data: No additional findings.  Objective: Vital Signs: There were no vitals taken for this visit.  Physical Exam:  Constitutional: Patient appears well-developed HEENT:  Head: Normocephalic Eyes:EOM are normal Neck: Normal range of motion Cardiovascular: Normal rate Pulmonary/chest: Effort normal Neurologic: Patient is alert Skin: Skin is warm Psychiatric: Patient has normal mood and affect  Ortho Exam: Ortho exam demonstrates 5 to 10 degree flexion contracture in both knees.  Neither knee has an effusion today.  Pedal pulses palpable.  Groin pain slightly less on the left-hand side compared to prior visits.  Patient does have intact extensor mechanism and flexion past 90 degrees.  Specialty Comments:  CLINICAL DATA:  78 year old female with low back pain radiating down the leg.   EXAM: MRI LUMBAR SPINE WITHOUT CONTRAST   TECHNIQUE: Multiplanar, multisequence MR imaging of the lumbar spine was performed. No intravenous contrast was administered.   COMPARISON:  Lumbar radiographs 02/08/2023.  Lumbar MRI 04/09/2020.   FINDINGS: Segmentation: Normal on the comparison radiographs,  the same numbering system used on the previous MRI.   Alignment: Chronic straightening of lumbar lordosis appears stable since 2021. Mild dextroconvex upper and levoconvex mid to lower lumbar scoliosis as noted on the comparison radiographs (series 14 today). There is subtle degenerative anterolisthesis of L3 on L4. See additional details of that level below.    Vertebrae: Normal background bone marrow signal and maintained vertebral height. Degenerative endplate marrow signal changes at L3-L4 including mild vertebral marrow edema eccentric to the right (series 107, image 10).   No other marrow edema or acute osseous abnormality. Intact visible sacrum and SI joints.   Conus medullaris and cauda equina: Conus extends to the L1-L2 level. No lower spinal cord or conus signal abnormality. Generally normal cauda equina nerve roots.   Paraspinal and other soft tissues: Negative.   Disc levels:   Visible lower thoracic levels are normal for age through T12-L1.   L1-L2: Mild disc bulging asymmetric to the left. Borderline to mild left facet hypertrophy. No stenosis.   L2-L3: Similar mild asymmetric disc bulging to the left. Mild left facet and mild ligament flavum hypertrophy. No stenosis.   L3-L4: Subtle anterolisthesis. Disc space loss since 2021. Asymmetric disc bulging, bulky on the right side. Progressed and up to moderate right side facet and ligament flavum hypertrophy. No significant spinal stenosis. Borderline to mild right lateral recess stenosis (right L4 nerve level). And moderate progressed right L3 neural foraminal stenosis (series 105, image 7).   L4-L5: Subtle anterolisthesis also at this level. But only minor disc bulging. Mild facet and ligament flavum hypertrophy. No stenosis.   L5-S1:  Negative.   IMPRESSION: 1. Mild lumbar scoliosis. Progressed L3-L4 disc, endplate, and posterior element degeneration since 2021 with subtle anterolisthesis there. Increased and moderate right neural foraminal stenosis, mild right lateral recess stenosis. Query Right L3 and/or L4 radiculitis. 2. Otherwise mild for age lumbar spine degeneration. No spinal stenosis or other neural impingement.     Electronically Signed   By: Marlise Simpers M.D.   On: 10/30/2023 11:23  Imaging: No results found.   PMFS History: Patient Active Problem  List   Diagnosis Date Noted   Iron deficiency anemia 12/28/2023   Closed fracture of nasal bones 04/26/2023   Mixed conductive and sensorineural hearing loss of right ear with restricted hearing of left ear 11/08/2022   Change in bowel function 11/07/2022   Mallory-Weiss tear 07/03/2022   Allergy    Anxiety    Arthritis    Cataract    Depression    DM (diabetes mellitus) (HCC)    Dyspnea    Heart murmur    History of kidney stones    Hyperlipidemia    Kidney stones    Sleep apnea    Status post dilation of esophageal narrowing    Asthma 01/16/2020   History of transient ischemic attack (TIA) 01/16/2020   Bilateral carotid artery stenosis 01/16/2020   Normocytic anemia 01/16/2020   Diabetes mellitus type II, non insulin dependent (HCC) 07/05/2019   Mild aortic stenosis 07/05/2019   Mitral regurgitation 07/05/2019   Pedal edema 05/06/2019   DOE (dyspnea on exertion) 05/06/2019   Hearing loss, mixed, bilateral 12/17/2018   Bilateral primary osteoarthritis of knee 10/16/2018   OSA (obstructive sleep apnea) 09/28/2018   Shoulder arthritis 02/22/2018   Primary osteoarthritis, left shoulder 12/11/2017   DDD (degenerative disc disease), lumbar 01/23/2017   History of diabetes mellitus 01/23/2017   History of depression 01/23/2017   History of asthma 01/23/2017  Dyslipidemia 01/23/2017   Age-related osteoporosis without current pathological fracture 01/23/2017   History of gastroesophageal reflux (GERD) 01/17/2017   Primary osteoarthritis of both knees 01/17/2017   Primary osteoarthritis of both feet 01/17/2017   Vitamin D deficiency 01/17/2017   Rheumatoid factor positive 12/13/2016   Retained orthopedic hardware 11/14/2016   Stiffness of left wrist joint 06/06/2016   Closed fracture of distal end of radius 06/05/2016   Acquired trigger finger 04/20/2016   Obstructive lung disease (HCC) 07/29/2015   H/O allergic rhinitis 07/29/2015   LPRD (laryngopharyngeal reflux disease)  07/29/2015   Chest pain 11/01/2013   Murmur 11/01/2013   DM 03/03/2010   ABDOMINAL PAIN RIGHT LOWER QUADRANT 03/03/2010   FULL INCONTINENCE OF FECES 03/03/2010   H/O adenomatous polyp of colon 2011   DECREASED HEARING 05/02/2008   Essential hypertension 05/02/2008   ESOPHAGITIS 05/02/2008   HIATAL HERNIA 05/02/2008   RECTAL INCONTINENCE 05/02/2008   SARCOIDOSIS 11/16/2007   Severe persistent asthma 11/16/2007   GERD 11/16/2007   IRRITABLE BOWEL SYNDROME 11/16/2007   Past Medical History:  Diagnosis Date   ABDOMINAL PAIN RIGHT LOWER QUADRANT 03/03/2010   Qualifier: Diagnosis of  By: Grandville Lax MD, Raj Burdock    Acquired trigger finger 04/20/2016   Age-related osteoporosis without current pathological fracture 01/23/2017   Allergy    Anxiety    Arthritis    Asthma    Bilateral carotid artery stenosis 01/16/2020   Bilateral primary osteoarthritis of knee 10/16/2018   Cataract    bilateral and removed   Change in bowel function 11/07/2022   Chest pain 11/01/2013   Closed fracture of distal end of radius 06/05/2016   Closed fracture of nasal bones 04/26/2023   Last Assessment & Plan: Formatting of this note might be different from the original. Nasal trauma. See history of present illness.  Denies nasal obstruction or concern over the appearance of the nose.  CT is reviewed and shows minimally displaced right nasal bone fracture. EXAM shows nasal dorsum in the midline.  Still little edema of the nasal dorsum.  Intranasal exam shows midline nasal septum.   DDD (degenerative disc disease), lumbar 01/23/2017   DECREASED HEARING 05/02/2008   Qualifier: Diagnosis of   By: Nelson-Smith CMA (AAMA), Dottie      Replacing diagnoses that were inactivated after the 02/27/23 regulatory import   Depression    Diabetes mellitus type II, non insulin dependent (HCC) 07/05/2019   DM 03/03/2010   Qualifier: Diagnosis of  By: Felipe Horton CMA, Loetta Ringer     DM (diabetes mellitus) (HCC)    DOE (dyspnea on exertion)  05/06/2019   Dyslipidemia 01/23/2017   Dyspnea    Esophageal reflux    ESOPHAGITIS 05/02/2008   Qualifier: Diagnosis of   By: Misty Amour CMA (AAMA), Dottie      Replacing diagnoses that were inactivated after the 02/27/23 regulatory import   Essential hypertension 05/02/2008   Qualifier: Diagnosis of  By: Nelson-Smith CMA (AAMA), Dottie     Full incontinence of feces 03/03/2010   Qualifier: Diagnosis of  By: Grandville Lax MD, Raj Burdock    H/O adenomatous polyp of colon 2011   H/O allergic rhinitis 07/29/2015   Hearing loss, mixed, bilateral 12/17/2018   Heart murmur    no cardiologist, family Dr. Lucillie Saba   HIATAL HERNIA 05/02/2008   Qualifier: Diagnosis of   By: Misty Amour CMA (AAMA), Dottie      Replacing diagnoses that were inactivated after the 02/27/23 regulatory import   History of asthma 01/23/2017  History of depression 01/23/2017   History of diabetes mellitus 01/23/2017   History of gastroesophageal reflux (GERD) 01/17/2017   History of kidney stones    History of transient ischemic attack (TIA) 01/16/2020   Hyperlipidemia    Irritable bowel syndrome    Kidney stones    LPRD (laryngopharyngeal reflux disease) 07/29/2015   Mallory-Weiss tear 07/03/2022   Mild aortic stenosis 07/05/2019   Mitral regurgitation 07/05/2019   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 Here for medical clearance for the right ear. Noted to have recent worsening of her right ear on audiogram at an outside facility.  Denies any pain.  In general, she is functioning well with her current hearing aid though she is looking at getting a new one.  Left ear with profound loss from prior surgery.  Audio   Murmur 11/01/2013   Normocytic anemia 01/16/2020   Obstructive lung disease (HCC) 07/29/2015   OSA (obstructive sleep apnea) 09/28/2018   Pain in left wrist  06/06/2016   Pedal edema 05/06/2019   Primary osteoarthritis of both feet 01/17/2017   Primary osteoarthritis of both knees 01/17/2017   Primary osteoarthritis, left shoulder 12/11/2017   RECTAL INCONTINENCE 05/02/2008   Qualifier: Diagnosis of   By: Misty Amour CMA (AAMA), Dottie      Replacing diagnoses that were inactivated after the 02/27/23 regulatory import   Retained orthopedic hardware 11/14/2016   Rheumatoid factor positive 12/13/2016   Sarcoidosis    Severe persistent asthma 11/16/2007   Annotation: chronic Qualifier: Diagnosis of  By: Waymond Hailey MD, Nadene Aures    Shoulder arthritis 02/22/2018   Sleep apnea    no cpap now   Status post dilation of esophageal narrowing    Stiffness of left wrist joint 06/06/2016   Stroke (HCC)    Vitamin D deficiency 01/17/2017    Family History  Problem Relation Age of Onset   Esophageal cancer Father    Diabetes Mother    Diabetes Maternal Grandmother    Colon cancer Neg Hx    Rectal cancer Neg Hx    Stomach cancer Neg Hx     Past Surgical History:  Procedure Laterality Date   BIOPSY  12/28/2023   Procedure: BIOPSY;  Surgeon: Daina Drum, MD;  Location: [facility];  Service: Gastroenterology;;   BLADDER SURGERY     CATARACT EXTRACTION Bilateral    COLONOSCOPY WITH PROPOFOL N/A 12/28/2023   Procedure: COLONOSCOPY WITH PROPOFOL;  Surgeon: Daina Drum, MD;  Location: [facility];  Service: Gastroenterology;  Laterality: N/A;   ESOPHAGOGASTRODUODENOSCOPY (EGD) WITH PROPOFOL N/A 12/28/2023   Procedure: ESOPHAGOGASTRODUODENOSCOPY (EGD) WITH PROPOFOL;  Surgeon: Daina Drum, MD;  Location: [facility];  Service: Gastroenterology;  Laterality: N/A;   HARDWARE REMOVAL Left 01/12/2017   Procedure: HARDWARE REMOVAL left wrist;  Surgeon: Brunilda Capra, MD;  Location: [facility];  Service: Orthopedics;  Laterality: Left;   HEMORRHOID SURGERY     NASAL SEPTUM SURGERY     PARTIAL HYSTERECTOMY     POLYPECTOMY  12/28/2023    Procedure: POLYPECTOMY;  Surgeon: Daina Drum, MD;  Location: [facility];  Service: Gastroenterology;;   REVERSE SHOULDER ARTHROPLASTY Left 02/22/2018   REVERSE SHOULDER ARTHROPLASTY Left 02/22/2018   Procedure: LEFT REVERSE SHOULDER ARTHROPLASTY;  Surgeon: Jasmine Mesi, MD;  [facility];  Service: Orthopedics;  Laterality: Left;  TONSILLECTOMY AND ADENOIDECTOMY     TRANSCAROTID ARTERY REVASCULARIZATION  Left 01/17/2020   Procedure: TRANSCAROTID ARTERY REVASCULARIZATION;  Surgeon: Margherita Shell, MD;  Location: [facility] OR;  Service: Vascular;  Laterality: Left;   TRIGGER FINGER RELEASE Left 05/12/2016   Procedure: RELEASE TRIGGER FINGER/A-1 PULLEY INFECTION LEFT RING TENDON SHEATH INJECT RIGHT INDEX FINGER;  Surgeon: Brunilda Capra, MD;  Location: [facility];  Service: Orthopedics;  Laterality: Left;   TUBAL LIGATION     Social History   Occupational History   Occupation: retired  Tobacco Use   Smoking status: Never   Smokeless tobacco: Never  Vaping Use   Vaping status: Never Used  Substance and Sexual Activity   Alcohol use: No   Drug use: No   Sexual activity: Not on file

## 2024-03-08 NOTE — Addendum Note (Signed)
 Addended by: Lonia Farber on: 03/08/2024 12:48 PM   Modules accepted: Orders

## 2024-03-08 NOTE — Telephone Encounter (Addendum)
 Called and spoke to patient.   Reviewed monitor and echo results with patient as per [facility] notes.  Patient reports that she is not taking Metoprolol. Spoke to Wallis Bamberg, NP who recommends patient to start taking Metoprolol Succinate 25 mg at bed time.  Reviewed recommendations of starting Lasix 20 mg once a day and returning in two weeks for blood work. Also reviewed reducing sodium intake. Patient verbalized understanding and had no further questions.   Rx sent to pharmacy

## 2024-03-08 NOTE — Telephone Encounter (Signed)
 [name] Woody's echo result note  "Your echocardiogram revealed that your heart is squeezing normally, moderately stiff when it relaxes--this can occur as we age, and can contribute to feelings of breathlessness.  Recommendations would be to monitor and reduce your salt intake.  Mild to moderate leaking around your mitral valve, this is unchanged since your last echocardiogram  Moderate narrowing of your aortic valve.  Elevated PA pressures as well.  Lets start lasix 20 mg daily to see if this helps with her symptoms.   Potassium is good so she should not need supplementation.  Repeat BMET in 2 weeks.

## 2024-03-09 MED ORDER — METHYLPREDNISOLONE ACETATE 40 MG/ML IJ SUSP
40.0000 mg | INTRAMUSCULAR | Status: AC | PRN
  Administered 2024-03-06: 40 mg via INTRA_ARTICULAR

## 2024-03-09 MED ORDER — BUPIVACAINE HCL 0.25 % IJ SOLN
4.0000 mL | INTRAMUSCULAR | Status: AC | PRN
  Administered 2024-03-06: 4 mL via INTRA_ARTICULAR

## 2024-03-09 MED ORDER — LIDOCAINE HCL 1 % IJ SOLN
5.0000 mL | INTRAMUSCULAR | Status: AC | PRN
  Administered 2024-03-06: 5 mL

## 2024-03-11 ENCOUNTER — Ambulatory Visit: Payer: Medicare Other | Admitting: Podiatry

## 2024-03-11 ENCOUNTER — Encounter: Payer: Self-pay | Admitting: Pharmacist

## 2024-03-11 ENCOUNTER — Encounter: Payer: Self-pay | Admitting: Podiatry

## 2024-03-11 DIAGNOSIS — E119 Type 2 diabetes mellitus without complications: Secondary | ICD-10-CM | POA: Diagnosis not present

## 2024-03-11 DIAGNOSIS — M79674 Pain in right toe(s): Secondary | ICD-10-CM

## 2024-03-11 DIAGNOSIS — B351 Tinea unguium: Secondary | ICD-10-CM

## 2024-03-11 DIAGNOSIS — M79675 Pain in left toe(s): Secondary | ICD-10-CM

## 2024-03-11 NOTE — Progress Notes (Signed)
  Subjective:  Patient ID: JULYA ALIOTO, female    DOB: [birth year],  MRN: [id number]  [facility]  78 y.o. female presents with the above complaint. History confirmed with patient. Patient presenting with pain related to dystrophic thickened elongated nails. Patient is unable to trim own nails related to nail dystrophy and/or mobility issues. Patient does have a history of T2DM without peripheral neuropathy.  She is doing well following previous ankle pain.  Objective:  Physical Exam: warm, good capillary refill, DP and PT pulses 2/4 bilateral nail exam onychomycosis of the toenails, onycholysis, and dystrophic nails DP pulses palpable, PT pulses palpable, and protective sensation intact Left Foot:  Pain with palpation of nails due to elongation and dystrophic growth.  Right Foot: Pain with palpation of nails due to elongation and dystrophic growth. 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  Assessment:   1. Diabetes mellitus type II, non insulin  dependent (HCC)   2. Pain due to onychomycosis of toenails of both feet      Plan:  Patient was evaluated and treated and all questions answered.  #Onychomycosis with pain  -Nails palliatively debrided as below. -Educated on self-care  Procedure: Nail Debridement Rationale: Pain Type of Debridement: manual, sharp debridement. Instrumentation: Nail nipper, rotary burr. Number of Nails: 10  Patient educated on diabetes. Discussed proper diabetic foot care and discussed risks and complications of disease. Educated patient in depth on reasons to return to the office immediately should he/she discover anything concerning or new on the feet. All questions answered. Discussed proper shoes as well.    Return in about 3 months (around 06/10/2024) for Diabetic Foot Care.         Rich Paprocki L. Marvis Sluder, DPM [facility] / [facility]

## 2024-03-11 NOTE — Progress Notes (Signed)
 Pharmacy Quality Measure Review  Statin Use in Persons with Diabetes (SUPD), new prescription for atorvastatin 40 mg sent to pharmacy. Confirmed filled and billed through [facility].   Calvert Caul, PharmD Clinical Pharmacist [place] Direct Dial: [phone]

## 2024-03-22 ENCOUNTER — Ambulatory Visit: Admitting: Primary Care

## 2024-03-22 ENCOUNTER — Encounter: Payer: Self-pay | Admitting: Primary Care

## 2024-03-22 VITALS — BP 147/54 | HR 50 | Temp 97.7°F | Ht 60.0 in | Wt 136.6 lb

## 2024-03-22 DIAGNOSIS — G4733 Obstructive sleep apnea (adult) (pediatric): Secondary | ICD-10-CM | POA: Diagnosis not present

## 2024-03-22 DIAGNOSIS — J4489 Other specified chronic obstructive pulmonary disease: Secondary | ICD-10-CM | POA: Diagnosis not present

## 2024-03-22 MED ORDER — FLUTICASONE-SALMETEROL 250-50 MCG/ACT IN AEPB
1.0000 | INHALATION_SPRAY | Freq: Two times a day (BID) | RESPIRATORY_TRACT | 11 refills | Status: AC
Start: 2024-03-22 — End: ?

## 2024-03-22 NOTE — Patient Instructions (Addendum)
 -  CHRONIC OBSTRUCTIVE PULMONARY DISEASE (COPD): COPD is a chronic lung condition that makes it hard to breathe. We will continue your current medications, Incruse and albuterol , and add Advair to help manage your symptoms and provide steroid coverage. Please use Incruse once daily, albuterol  as needed, and start taking Advair as prescribed. We will also coordinate with your pulmonologist for ongoing management.  -SEVERE OBSTRUCTIVE SLEEP APNEA: Severe obstructive sleep apnea is a condition where your breathing stops and starts repeatedly during sleep. To improve your CPAP use, we will adjust the settings to a fixed pressure of 11 cm H2O and increase the humidifier temperature for warmer air. We will also refer you to the CPAP supply company for a new mask fitting, considering a full face or different nasal mask. It is important to use your CPAP consistently due to the severity of your sleep apnea.  INSTRUCTIONS: Please follow up with your pulmonologist for ongoing management of your COPD. Additionally, contact the CPAP supply company for a new mask fitting and adjust your CPAP settings as discussed. Use your CPAP machine consistently to manage your sleep apnea effectively.  Follow-up 3 months with either Dr. Gaynell Keeler / new patient -30 mins

## 2024-03-22 NOTE — Progress Notes (Signed)
 @Patient  ID: [name], female    DOB: Sep 21, 1946, 78 y.o.   MRN: [id number]  Chief Complaint  Patient presents with   Follow-up    OSA and Asthma F/U    Referring provider: Harvest Lineman, MD  HPI: 78 year old female, never smoked. PMH significant for severe persistent asthma, OSA, LPR, HTN, mild aortic stenosis, mitral regurgitation, bilateral carotid artery stenosis, GERD, type 2 diabetes. Patient of Dr. Matilde Son, last seen in July 2020.   Previous [facility] pulmonary encounter: 10/20/2020 Patient presents today for follow-up. States that she has been to her doctor multiple times over the last several months d/t her asthma. She follows with Dr. Kozlow for severe persistent asthma, she was last seen by him on 07/29/20. He felt that she was doing well during that visit. Her primary care had treated her for bronchitis with 10 day course of cefdinir. She has not required systemic steriods. She is maintained on Trelegy 100, Singulair and Xolair  150mg .   She reported having a dry cough and some left sided pleuritic pain 2 weeks ago. Last Thursday she had a CT of her chest done at [facility] that showed small pneumonia and pleural effusion left side (unfortunately we do not have a record of this to review today). She completed a course of Levaquin and prednisone . She has not been taking mucinex . Cough and pleuritic pain have improved, she will have some discomfort when she lays down. She tells me that she is currently on Trelegy 200, this is not what we have documented in Epic or what [facility] has on file. Dr. Zenia Hight note has Trelegy 100 listed under medication list, however, on summary it says to continue Trelegy 200. States that she gets inhaler from drug company at no cost.  She is sleeping well at night. She is compliant with CPAP use. No issues with mask or pressure setting. Denies fever, chills, sweats, chest tightness, wheezing.   05/23/23- Dr. Matilde Son  She is here with her  husband.    She wasn't able to get spiriva .  From what she remembers it was too expensive, but she isn't sure the spiriva  was the expensive medicine.  She fell recently and hit her nose.  She has sinus drainage.    Her nose gets very dry and irritated while using CPAP.  She didn't know how to change the humidifier setting.  She has lost about 20 lbs and is wondering if she still needs CPAP.   Airview download 09/19/20-10/18/20: 28/30 (94%); 27 days (90%) Pressure 5-15cm h20  (12.2cm h20- 95%) Airleaks 30.3L/min AHI 3.9   Obstructive sleep apnea. - she is compliant with CPAP and reports benefit - she uses [employer] for her DME - current CPAP ordered November 2019 - continue auto CPAP 5 to 15 cm H2O - she has lost weight and is wondering if she still needs CPAP - will arrange for a home sleep study  - if she still has significant sleep apnea, will then need to arrange for a new auto CPAP machine later in the Fall - discussed how to adjust her CPAP humidifier   History of pulmonary sarcoidosis with bronchiectasis. - bronchial hygiene   COPD with asthma. - followed by Dr. Jerelene Monday with [facility] and remains on tezspire  injection - symptoms worse since she had to stop trelegy due to insurance coverage - will try sending script for spiriva  again - continue advair, singulair   Rheumatic valve disease, hypertension. - followed by Dr. Lafayette Pierre  with [facility]  03/22/2024 - Interim hx  Discussed the use of AI scribe software for clinical note transcription with the patient, who gave verbal consent to proceed.  History of Present Illness   [name] is a 78 year old female with COPD and sleep apnea who presents for management of her respiratory conditions.  She uses Incruse, one puff daily, and a rescue inhaler, Albuterol , approximately twice a week. Recently, she resumed taking Singulair (montelukast) after a lapse due to medication supply issues. She is also on  Tezspire  injections administered monthly. Previously, she was on Advair but discontinued it earlier this year due to perceived lack of benefit. Prior to this she was on Trelegy but it was too expensive. She experiences occasional chest tightness and a cough, which she attributes to post-nasal drip.  She has a history of severe sleep apnea, diagnosed via a home sleep study in July 2024, which showed 34 apneic events per hour and oxygen  desaturation to 70%. She has been using her CPAP machine inconsistently, about 30% of the time, due to discomfort from cold air and high pressure. She uses a nasal mask and has not tried other types.   She mentions a history of difficulty with medication costs, specifically with Trelegy, which led to a switch to Incruse based on insurance recommendations.      Airview download 12/21/23-03/19/24 Usage days 26/90 days (29%) Average usage days used 5 hours 49 mins  Pressure 5-15cm h20 (11.2cm h20-95%) Airleaks 33.6L/min (95%) AHI 4.4   Allergies  Allergen Reactions   Codeine Nausea And Vomiting   Sulfa Antibiotics Other (See Comments)    Allergic per pt's mother     Immunization History  Administered Date(s) Administered   Influenza, High Dose Seasonal PF 08/08/2018, 08/27/2018, 10/06/2020   Influenza, Quadrivalent, Recombinant, Inj, Pf 08/27/2019, 08/24/2021, 09/08/2021, 09/18/2023   Influenza-Unspecified 08/08/2018, 10/06/2020   Moderna Sars-Covid-2 Vaccination 01/24/2020, 02/26/2020    Past Medical History:  Diagnosis Date   ABDOMINAL PAIN RIGHT LOWER QUADRANT 03/03/2010   Qualifier: Diagnosis of  By: Grandville Lax MD, Raj Burdock    Acquired trigger finger 04/20/2016   Age-related osteoporosis without current pathological fracture 01/23/2017   Allergy    Anxiety    Arthritis    Asthma    Bilateral carotid artery stenosis 01/16/2020   Bilateral primary osteoarthritis of knee 10/16/2018   Cataract    bilateral and removed   Change in bowel function  11/07/2022   Chest pain 11/01/2013   Closed fracture of distal end of radius 06/05/2016   Closed fracture of nasal bones 04/26/2023   Last Assessment & Plan: Formatting of this note might be different from the original. Nasal trauma. See history of present illness.  Denies nasal obstruction or concern over the appearance of the nose.  CT is reviewed and shows minimally displaced right nasal bone fracture. EXAM shows nasal dorsum in the midline.  Still little edema of the nasal dorsum.  Intranasal exam shows midline nasal septum.   DDD (degenerative disc disease), lumbar 01/23/2017   DECREASED HEARING 05/02/2008   Qualifier: Diagnosis of   By: Misty Amour CMA (AAMA), Dottie      Replacing diagnoses that were inactivated after the 02/27/23 regulatory import   Depression    Diabetes mellitus type II, non insulin  dependent (HCC) 07/05/2019   DM 03/03/2010   Qualifier: Diagnosis of  By: Felipe Horton CMA, Loetta Ringer     DM (diabetes mellitus) (HCC)    DOE (dyspnea on exertion) 05/06/2019  Dyslipidemia 01/23/2017   Dyspnea    Esophageal reflux    ESOPHAGITIS 05/02/2008   Qualifier: Diagnosis of   By: Misty Amour CMA (AAMA), Dottie      Replacing diagnoses that were inactivated after the 02/27/23 regulatory import   Essential hypertension 05/02/2008   Qualifier: Diagnosis of  By: Nelson-Smith CMA (AAMA), Dottie     Full incontinence of feces 03/03/2010   Qualifier: Diagnosis of  By: Grandville Lax MD, Raj Burdock    H/O adenomatous polyp of colon 2011   H/O allergic rhinitis 07/29/2015   Hearing loss, mixed, bilateral 12/17/2018   Heart murmur    no cardiologist, family Dr. Lucillie Saba   HIATAL HERNIA 05/02/2008   Qualifier: Diagnosis of   By: Misty Amour CMA (AAMA), Dottie      Replacing diagnoses that were inactivated after the 02/27/23 regulatory import   History of asthma 01/23/2017   History of depression 01/23/2017   History of diabetes mellitus 01/23/2017   History of gastroesophageal reflux (GERD) 01/17/2017    History of kidney stones    History of transient ischemic attack (TIA) 01/16/2020   Hyperlipidemia    Irritable bowel syndrome    Kidney stones    LPRD (laryngopharyngeal reflux disease) 07/29/2015   Mallory-Weiss tear 07/03/2022   Mild aortic stenosis 07/05/2019   Mitral regurgitation 07/05/2019   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 Here for medical clearance for the right ear. Noted to have recent worsening of her right ear on audiogram at an outside facility.  Denies any pain.  In general, she is functioning well with her current hearing aid though she is looking at getting a new one.  Left ear with profound loss from prior surgery.  Audio   Murmur 11/01/2013   Normocytic anemia 01/16/2020   Obstructive lung disease (HCC) 07/29/2015   OSA (obstructive sleep apnea) 09/28/2018   Pain in left wrist 06/06/2016   Pedal edema 05/06/2019   Primary osteoarthritis of both feet 01/17/2017   Primary osteoarthritis of both knees 01/17/2017   Primary osteoarthritis, left shoulder 12/11/2017   RECTAL INCONTINENCE 05/02/2008   Qualifier: Diagnosis of   By: Misty Amour CMA (AAMA), Dottie      Replacing diagnoses that were inactivated after the 02/27/23 regulatory import   Retained orthopedic hardware 11/14/2016   Rheumatoid factor positive 12/13/2016   Sarcoidosis    Severe persistent asthma 11/16/2007   Annotation: chronic Qualifier: Diagnosis of  By: Waymond Hailey MD, Nadene Aures    Shoulder arthritis 02/22/2018   Sleep apnea    no cpap now   Status post dilation of esophageal narrowing    Stiffness of left wrist joint 06/06/2016   Stroke (HCC)    Vitamin D  deficiency 01/17/2017    Tobacco History: Social History   Tobacco Use  Smoking Status Never  Smokeless Tobacco Never   Counseling given: Not Answered   Outpatient Medications Prior  to Visit  Medication Sig Dispense Refill   acetaminophen  (TYLENOL ) 325 MG tablet Take 2 tablets (650 mg total) by mouth every 6 (six) hours as needed for mild pain (or Fever >/= 101).     albuterol  (VENTOLIN  HFA) 108 (90 Base) MCG/ACT inhaler Inhale 2 puffs into the lungs every 6 (six) hours as needed for wheezing or shortness of breath.     alendronate (FOSAMAX) 70 MG tablet Take 70 mg by mouth once a  week.     aspirin  81 MG EC tablet Take 1 tablet by mouth daily.     atorvastatin  (LIPITOR) 40 MG tablet Take 40 mg by mouth at bedtime.     citalopram  (CELEXA ) 20 MG tablet Take 20 mg by mouth daily.     diclofenac (VOLTAREN) 25 MG EC tablet Take 25 mg by mouth daily as needed.     famotidine  (PEPCID ) 40 MG tablet TAKE 1 TABLET BY MOUTH EVERY DAY AT BEDTIME 30 tablet 5   furosemide  (LASIX ) 20 MG tablet Take 1 tablet (20 mg total) by mouth daily. 90 tablet 3   ipratropium-albuterol  (DUONEB) 0.5-2.5 (3) MG/3ML SOLN Take 3 mLs by nebulization every 6 (six) hours as needed (shortness of breath/wheezing). 360 mL 1   levocetirizine (XYZAL) 5 MG tablet Take 5 mg by mouth at bedtime.     metoprolol  succinate (TOPROL -XL) 25 MG 24 hr tablet Take 1 tablet (25 mg total) by mouth at bedtime. 90 tablet 1   montelukast (SINGULAIR) 10 MG tablet Take 10 mg by mouth daily.     mupirocin ointment (BACTROBAN) 2 % Apply 1 Application topically 2 (two) times daily. X 10 days     omeprazole  (PRILOSEC) 40 MG capsule Take 40 mg by mouth 2 (two) times daily.     Probiotic Product (PROBIOTIC DAILY PO) Take 1 capsule by mouth daily.     Semaglutide ,0.25 or 0.5MG /DOS, (OZEMPIC , 0.25 OR 0.5 MG/DOSE,) 2 MG/1.5ML SOPN Inject 0.25 mg into the skin once a week.     Telmisartan -amLODIPine  80-10 MG TABS Take 1 tablet by mouth daily. 90 tablet 1   umeclidinium bromide  (INCRUSE ELLIPTA ) 62.5 MCG/ACT AEPB Inhale 1 puff into the lungs daily. 30 each 4   Facility-Administered Medications Prior to Visit  Medication Dose Route Frequency  Provider Last Rate Last Admin   tezepelumab -ekko (TEZSPIRE ) 210 MG/1. syringe 210 mg  210 mg Subcutaneous Q28 days Kozlow, Eric J, MD   210 mg at 02/29/24 1005    Review of Systems  Review of Systems  Constitutional: Negative.   HENT: Negative.    Respiratory:  Positive for chest tightness and shortness of breath.   Cardiovascular: Negative.      Physical Exam  BP (!) 147/54 (BP Location: Left Arm, Patient Position: Sitting, Cuff Size: Normal)   Pulse (!) 50   Temp 97.7 F (36.5 C) (Temporal)   Ht 5' (1.524 m)   Wt 136 lb 9.6 oz (62 kg)   SpO2 98%   BMI 26.68 kg/m  Physical Exam Constitutional:      Appearance: Normal appearance.  HENT:     Head: Normocephalic and atraumatic.  Cardiovascular:     Rate and Rhythm: Normal rate and regular rhythm.  Pulmonary:     Effort: Pulmonary effort is normal.     Breath sounds: Normal breath sounds.  Musculoskeletal:     Cervical back: Normal range of motion and neck supple.  Skin:    General: Skin is warm and dry.  Neurological:     General: No focal deficit present.     Mental Status: She is alert and oriented to person, place, and time. Mental status is at baseline.  Psychiatric:        Mood and Affect: Mood normal.        Behavior: Behavior normal.        Thought Content: Thought content normal.        Judgment: Judgment normal.      Lab Results:  CBC  Component Value Date/Time   WBC 6.7 10/16/2023 1134   RBC 3.82 (L) 10/16/2023 1134   HGB 12.2 10/16/2023 1134   HGB 9.9 (L) 01/16/2019 1529   HCT 36.7 10/16/2023 1134   HCT 32.5 (L) 01/16/2019 1529   PLT 236.0 10/16/2023 1134   PLT 298 01/16/2019 1529   MCV 95.9 10/16/2023 1134   MCV 85 01/16/2019 1529   MCH 28.5 03/03/2020 2120   MCHC 33.4 10/16/2023 1134   RDW 15.4 10/16/2023 1134   RDW 14.4 01/16/2019 1529   LYMPHSABS 0.8 01/16/2019 1529   EOSABS 0.1 01/16/2019 1529   BASOSABS 0.1 01/16/2019 1529    BMET    Component Value Date/Time   NA 142  02/16/2024 1056   K 4.8 02/16/2024 1056   CL 104 02/16/2024 1056   CO2 23 02/16/2024 1056   GLUCOSE 71 02/16/2024 1056   GLUCOSE 113 (H) 03/03/2020 2120   BUN 22 02/16/2024 1056   CREATININE 0.86 02/16/2024 1056   CALCIUM  9.5 02/16/2024 1056   GFRNONAA 72 07/21/2020 0856   GFRAA 83 07/21/2020 0856    BNP No results found for: "BNP"  ProBNP No results found for: "PROBNP"  Imaging: ECHOCARDIOGRAM COMPLETE Result Date: 03/08/2024    ECHOCARDIOGRAM REPORT   Patient Name:   [name] Date of Exam: 03/07/2024 Medical Rec #:  [id number]        Height:       63.0 in Accession #:    [id number]       Weight:       138.0 lb Date of Birth:  May 09, 1946       BSA:          1.652 m Patient Age:    77 years         BP:           168/70 mmHg Patient Gender: F                HR:           75 bpm. Exam Location:  [facility] Procedure: 2D Echo, Cardiac Doppler, Color Doppler and Strain Analysis (Both            Spectral and Color Flow Doppler were utilized during procedure). Indications:    Heart murmur [R01.1 (ICD-10-CM)]  History:        Patient has prior history of Echocardiogram examinations, most                 recent 07/03/2019. Aortic Valve Disease, Signs/Symptoms:Murmur;                 Risk Factors:Hypertension, Diabetes and Dyslipidemia. Mitral                 regurgitation.  Sonographer:    Erminia Hazel RDCS Referring Phys: [id number] JENNIFER C WOODY IMPRESSIONS  1. Left ventricular ejection fraction, by estimation, is 60 to 65%. The left ventricle has normal function. The left ventricle has no regional wall motion abnormalities. There is mild left ventricular hypertrophy. Left ventricular diastolic parameters are consistent with Grade II diastolic dysfunction (pseudonormalization). The average left ventricular global longitudinal strain is -16.4 %. The global longitudinal strain is abnormal.  2. Right ventricular systolic function is normal. The right ventricular size is normal.  3. There is  moderately elevated pulmonary artery systolic pressure. The estimated right ventricular systolic pressure is 57.0 mmHg.  4. Left atrial size was moderately dilated.  5. The mitral valve is degenerative. Mild to moderate  mitral valve regurgitation. No evidence of mitral stenosis. Moderate mitral annular calcification.  6. The aortic valve is tricuspid. There is mild calcification of the aortic valve. There is moderate thickening of the aortic valve. Aortic valve regurgitation is moderate. Moderate aortic valve stenosis. Aortic valve area, by VTI measures 1.22 cm. Aortic valve mean gradient measures 25.2 mmHg. Aortic valve Vmax measures 3.33 m/s.  7. The inferior vena cava is dilated in size with >50% respiratory variability, suggesting right atrial pressure of 8 mmHg. Comparison(s): In comparison prior TTE from 07/02/2019 report notes LVEF 60-65%, Mild-mod MR, Mild AI and Mild AS. FINDINGS  Left Ventricle: Left ventricular ejection fraction, by estimation, is 60 to 65%. The left ventricle has normal function. The left ventricle has no regional wall motion abnormalities. The average left ventricular global longitudinal strain is -16.4 %. Strain was performed and the global longitudinal strain is abnormal. The left ventricular internal cavity size was normal in size. There is mild left ventricular hypertrophy. Left ventricular diastolic parameters are consistent with Grade II diastolic dysfunction (pseudonormalization). Right Ventricle: The right ventricular size is normal. No increase in right ventricular wall thickness. Right ventricular systolic function is normal. There is moderately elevated pulmonary artery systolic pressure. The tricuspid regurgitant velocity is 3.50 m/s, and with an assumed right atrial pressure of 8 mmHg, the estimated right ventricular systolic pressure is 57.0 mmHg. Left Atrium: Left atrial size was moderately dilated. Right Atrium: Right atrial size was normal in size. Pericardium: There is  no evidence of pericardial effusion. Mitral Valve: The mitral valve is degenerative in appearance. Moderate mitral annular calcification. Mild to moderate mitral valve regurgitation. No evidence of mitral valve stenosis. Tricuspid Valve: The tricuspid valve is grossly normal. Tricuspid valve regurgitation is mild . No evidence of tricuspid stenosis. Aortic Valve: The aortic valve is tricuspid. There is mild calcification of the aortic valve. There is moderate thickening of the aortic valve. Aortic valve regurgitation is moderate. Aortic regurgitation PHT measures 381 msec. Moderate aortic stenosis is present. Aortic valve mean gradient measures 25.2 mmHg. Aortic valve peak gradient measures 44.3 mmHg. Aortic valve area, by VTI measures 1.22 cm. Pulmonic Valve: The pulmonic valve was not well visualized. Pulmonic valve regurgitation is not visualized. No evidence of pulmonic stenosis. Aorta: The aortic root and ascending aorta are structurally normal, with no evidence of dilitation. Venous: The inferior vena cava is dilated in size with greater than 50% respiratory variability, suggesting right atrial pressure of 8 mmHg. IAS/Shunts: The interatrial septum was not well visualized.  LEFT VENTRICLE PLAX 2D LVIDd:         4.60 cm   Diastology LVIDs:         3.10 cm   LV e' medial:    9.90 cm/s LV PW:         1.00 cm   LV E/e' medial:  9.4 LV IVS:        1.20 cm   LV e' lateral:   9.36 cm/s LVOT diam:     1.60 cm   LV E/e' lateral: 9.9 LV SV:         90 LV SV Index:   54        2D Longitudinal Strain LVOT Area:     2.01 cm  2D Strain GLS Avg:     -16.4 %  RIGHT VENTRICLE             IVC RV Basal diam:  3.20 cm     IVC diam: 2.30  cm RV Mid diam:    2.60 cm RV S prime:     17.50 cm/s TAPSE (M-mode): 2.6 cm LEFT ATRIUM             Index        RIGHT ATRIUM           Index LA diam:        4.10 cm 2.48 cm/m   RA Area:     15.80 cm LA Vol (A2C):   59.6 ml 36.09 ml/m  RA Volume:   41.00 ml  24.83 ml/m LA Vol (A4C):   78.1  ml 47.29 ml/m LA Biplane Vol: 73.9 ml 44.75 ml/m  AORTIC VALVE AV Area (Vmax):    1.18 cm AV Area (Vmean):   1.13 cm AV Area (VTI):     1.22 cm AV Vmax:           332.80 cm/s AV Vmean:          234.200 cm/s AV VTI:            0.739 m AV Peak Grad:      44.3 mmHg AV Mean Grad:      25.2 mmHg LVOT Vmax:         196.00 cm/s LVOT Vmean:        132.000 cm/s LVOT VTI:          0.447 m LVOT/AV VTI ratio: 0.60 AI PHT:            381 msec AR Vena Contracta: 0.20 cm  AORTA Ao Root diam: 3.00 cm Ao Asc diam:  2.90 cm Ao Desc diam: 1.90 cm MITRAL VALVE                  TRICUSPID VALVE MV Area (PHT): 2.48 cm       TR Peak grad:   49.0 mmHg MV Decel Time: 306 msec       TR Vmax:        350.00 cm/s MR Peak grad:    180.1 mmHg MR Vmax:         671.00 cm/s  SHUNTS MR PISA:         1.57 cm     Systemic VTI:  0.45 m MR PISA Eff ROA: 9 mm        Systemic Diam: 1.60 cm MR PISA Radius:  0.50 cm MV E velocity: 92.60 cm/s MV A velocity: 82.80 cm/s MV E/A ratio:  1.12 Sreedhar reddy Madireddy Electronically signed by Tereasa Felty reddy Madireddy Signature Date/Time: 03/08/2024/8:14:17 AM    Final    LONG TERM MONITOR (3-14 DAYS) Result Date: 03/06/2024 Patch Wear Time:  3 days and 5 hours (2025-03-21T10:51:40-0400 to 2025-03-24T16:30:28-0400) Patient had a min HR of 48 bpm, max HR of 156 bpm, and avg HR of 65 bpm. Predominant underlying rhythm was Sinus Rhythm. 59 Supraventricular Tachycardia runs occurred, the run with the fastest interval lasting 4 beats with a max rate of 156 bpm, the longest lasting 19 beats with an avg rate of 110 bpm. Isolated SVEs were rare (<1.0%), and no SVE Couplets or SVE Triplets were present. Isolated VEs were rare (<1.0%), and no VE Couplets or VE Triplets were present.     Assessment & Plan:   No problem-specific Assessment & Plan notes found for this encounter.   Assessment and Plan    Chronic Obstructive Pulmonary Disease (COPD) with Asthma COPD managed with Incruse, Singulair, Tezspire  and  albuterol . Follows with Dr. Kandi Oris with Allergy  and Asthma. Somehow Advair was discontinued since last OV due to cost and perceived ineffectiveness. Occasional chest tightness and cough possibly from post-nasal drip. Advair recommended for symptom management and steroid coverage. - Prescribe Advair (fluticasone /salmeterol) 250-50mcg one puff BID with Incruse (umeclidinium) once daily. - Continue albuterol  as needed for rescue therapy. - Coordinate with pulmonologist for ongoing management.  Severe Obstructive Sleep Apnea Severe obstructive sleep apnea confirmed by home sleep study. CPAP use inconsistent due to discomfort from high pressure and cold air. Minor weight loss unlikely to alter apnea severity. CPAP settings and mask adjustments recommended to improve compliance. - Adjust CPAP settings to a fixed pressure of 11 cm H2O. - Refer to CPAP supply company for new mask fitting, considering a full face or different nasal mask. - Encourage consistent CPAP use due to severe sleep apnea.      Antonio Baumgarten, NP 03/22/2024

## 2024-03-26 ENCOUNTER — Telehealth: Payer: Self-pay | Admitting: Emergency Medicine

## 2024-03-26 LAB — BASIC METABOLIC PANEL WITH GFR
BUN/Creatinine Ratio: 26 (ref 12–28)
BUN: 19 mg/dL (ref 8–27)
CO2: 24 mmol/L (ref 20–29)
Calcium: 8.7 mg/dL (ref 8.7–10.3)
Chloride: 105 mmol/L (ref 96–106)
Creatinine, Ser: 0.74 mg/dL (ref 0.57–1.00)
Glucose: 83 mg/dL (ref 70–99)
Potassium: 4.4 mmol/L (ref 3.5–5.2)
Sodium: 142 mmol/L (ref 134–144)
eGFR: 83 mL/min/1.73

## 2024-03-26 NOTE — Telephone Encounter (Signed)
-----   Message from Terrance Ferretti sent at 03/26/2024  7:36 AM EDT ----- Normal BMET.

## 2024-03-26 NOTE — Telephone Encounter (Signed)
 Called and spoke to patient. Reviewed lab results with patient as per [facility] note. She verbalized understanding and had no further questions.

## 2024-03-27 DIAGNOSIS — K080 Exfoliation of teeth due to systemic causes: Secondary | ICD-10-CM | POA: Diagnosis not present

## 2024-03-28 ENCOUNTER — Ambulatory Visit

## 2024-03-28 DIAGNOSIS — J4550 Severe persistent asthma, uncomplicated: Secondary | ICD-10-CM

## 2024-04-04 ENCOUNTER — Telehealth: Payer: Self-pay

## 2024-04-04 ENCOUNTER — Telehealth: Payer: Self-pay | Admitting: Pharmacist

## 2024-04-04 NOTE — Telephone Encounter (Addendum)
 Completed patient application application for Incruse through [employer].  Emailed completed application to Tiffany to have patient sign at upcoming appointment.

## 2024-04-04 NOTE — Progress Notes (Signed)
   04/04/2024  Patient ID: Glenwood Lapidus, female   DOB: [birth year], 78 y.o.   MRN: [id number]  Telephonic engagement with patient today to follow up on out of pocket costs for prescription medications. We have been waiting for drug spend to reach $600 for application to [employer] for Incruse Ellipta . Patient recently prescribed Advair from pulmonology in order to include inhaled corticosteroid. Will discuss with PCP as original intention was for patient to receive patient assistance for Trelegy.  Called [employer] [place] member line on a 3 way call with patient on the line to get out of pocket proof of spend mailed to patient's home. Expected to arrive within 10 business days. Advised patient to bring to the office and sign [employer] patient assistance application once she receives. Patient verbalized understanding and appreciative of my call.   Calvert Caul, PharmD Clinical Pharmacist [place] Direct Dial: [phone]

## 2024-04-06 DIAGNOSIS — G4733 Obstructive sleep apnea (adult) (pediatric): Secondary | ICD-10-CM | POA: Diagnosis not present

## 2024-04-09 ENCOUNTER — Telehealth: Payer: Self-pay

## 2024-04-09 DIAGNOSIS — M25552 Pain in left hip: Secondary | ICD-10-CM

## 2024-04-09 NOTE — Telephone Encounter (Signed)
 Last injection 12/24 % 85 relief/function ability Duration of relief/ improvement 4 months Current pain scale---5 Recent falls or injuries--None Lower back radiating to left hip area.[name]

## 2024-04-09 NOTE — Addendum Note (Signed)
 Addended by: Milas Alias on: 04/09/2024 04:00 PM   Modules accepted: Orders

## 2024-04-18 ENCOUNTER — Other Ambulatory Visit: Payer: Self-pay | Admitting: Pharmacist

## 2024-04-18 NOTE — Telephone Encounter (Signed)
 PAP: Application for Incruse Ellipta  has been submitted to [employer] ([employer]), via fax

## 2024-04-18 NOTE — Progress Notes (Signed)
   04/18/2024  Patient ID: [name], female   DOB: 02-15-1946, 78 y.o.   MRN: [id number]  Patient came to office today and signed application for Incruse Ellipta  inhaler for [employer] assistance program. Application and required documents sent to [facility].   Calvert Caul, PharmD Clinical Pharmacist  Direct Dial: [phone]

## 2024-04-25 ENCOUNTER — Ambulatory Visit (INDEPENDENT_AMBULATORY_CARE_PROVIDER_SITE_OTHER): Admitting: *Deleted

## 2024-04-25 ENCOUNTER — Other Ambulatory Visit: Payer: Self-pay

## 2024-04-25 ENCOUNTER — Ambulatory Visit: Admitting: Physical Medicine and Rehabilitation

## 2024-04-25 DIAGNOSIS — J4550 Severe persistent asthma, uncomplicated: Secondary | ICD-10-CM | POA: Diagnosis not present

## 2024-04-25 DIAGNOSIS — M25552 Pain in left hip: Secondary | ICD-10-CM

## 2024-04-25 MED ORDER — BUPIVACAINE HCL 0.25 % IJ SOLN
4.0000 mL | INTRAMUSCULAR | Status: AC | PRN
  Administered 2024-04-25: 4 mL via INTRA_ARTICULAR

## 2024-04-25 MED ORDER — TRIAMCINOLONE ACETONIDE 40 MG/ML IJ SUSP
40.0000 mg | INTRAMUSCULAR | Status: AC | PRN
  Administered 2024-04-25: 40 mg via INTRA_ARTICULAR

## 2024-04-25 NOTE — Progress Notes (Signed)
 Pain Scale   Average Pain 9 Patient advising she has Chronic left hip pain        +Driver, -BT, -Dye Allergies.

## 2024-04-25 NOTE — Progress Notes (Signed)
 [name] - 78 y.o. female MRN [id number]  Date of birth: Mar 22, 1946  Office Visit Note: Visit Date: 04/25/2024 PCP: Harvest Lineman, MD Referred by: Harvest Lineman, MD  Subjective: Chief Complaint  Patient presents with   Left Hip - Pain   HPI:  [name] is a 78 y.o. female who comes in today at the request of Dr. Marykay Snipes for planned Left anesthetic hip arthrogram with fluoroscopic guidance.  The patient has failed conservative care including home exercise, medications, time and activity modification.  This injection will be diagnostic and hopefully therapeutic.  Please see requesting physician notes for further details and justification. She had question about her recent bone density scan. She has osteopenia but worsened. She will follow with PCP. Second question regarding her husband. She was confused about why he had not been seen. I was confused on who was seeing him. I found that Dr. Rozelle Corning had put in referral for epidural injection and my staff had called him but could to leave a message due to voicemail being full. An appointment was made for him today.   ROS Otherwise per HPI.  Assessment & Plan: Visit Diagnoses:    ICD-10-CM   1. Pain in left hip  M25.552 Large Joint Inj: L hip joint    XR C-ARM NO REPORT      Plan: No additional findings.   Meds & Orders: No orders of the defined types were placed in this encounter.   Orders Placed This Encounter  Procedures   Large Joint Inj: L hip joint   XR C-ARM NO REPORT    Follow-up: Return for visit to requesting provider as needed.   Procedures: Large Joint Inj: L hip joint on 04/25/2024 1:37 PM Indications: diagnostic evaluation and pain Details: 22 G 3.5 in needle, fluoroscopy-guided anterior approach  Arthrogram: No  Medications: 4 mL bupivacaine  0.25 %; 40 mg triamcinolone  acetonide 40 MG/ML Outcome: tolerated well, no immediate complications  There was excellent flow of contrast producing  a partial arthrogram of the hip. The patient did have relief of symptoms during the anesthetic phase of the injection. Procedure, treatment alternatives, risks and benefits explained, specific risks discussed. Consent was given by the patient. Immediately prior to procedure a time out was called to verify the correct patient, procedure, equipment, support staff and site/side marked as required. Patient was prepped and draped in the usual sterile fashion.          Clinical History: CLINICAL DATA:  78 year old female with low back pain radiating down the leg.   EXAM: MRI LUMBAR SPINE WITHOUT CONTRAST   TECHNIQUE: Multiplanar, multisequence MR imaging of the lumbar spine was performed. No intravenous contrast was administered.   COMPARISON:  Lumbar radiographs 02/08/2023.  Lumbar MRI 04/09/2020.   FINDINGS: Segmentation: Normal on the comparison radiographs, the same numbering system used on the previous MRI.   Alignment: Chronic straightening of lumbar lordosis appears stable since 2021. Mild dextroconvex upper and levoconvex mid to lower lumbar scoliosis as noted on the comparison radiographs (series 14 today). There is subtle degenerative anterolisthesis of L3 on L4. See additional details of that level below.   Vertebrae: Normal background bone marrow signal and maintained vertebral height. Degenerative endplate marrow signal changes at L3-L4 including mild vertebral marrow edema eccentric to the right (series 107, image 10).   No other marrow edema or acute osseous abnormality. Intact visible sacrum and SI joints.   Conus medullaris and cauda equina: Conus extends to the  L1-L2 level. No lower spinal cord or conus signal abnormality. Generally normal cauda equina nerve roots.   Paraspinal and other soft tissues: Negative.   Disc levels:   Visible lower thoracic levels are normal for age through T12-L1.   L1-L2: Mild disc bulging asymmetric to the left. Borderline to  mild left facet hypertrophy. No stenosis.   L2-L3: Similar mild asymmetric disc bulging to the left. Mild left facet and mild ligament flavum hypertrophy. No stenosis.   L3-L4: Subtle anterolisthesis. Disc space loss since 2021. Asymmetric disc bulging, bulky on the right side. Progressed and up to moderate right side facet and ligament flavum hypertrophy. No significant spinal stenosis. Borderline to mild right lateral recess stenosis (right L4 nerve level). And moderate progressed right L3 neural foraminal stenosis (series 105, image 7).   L4-L5: Subtle anterolisthesis also at this level. But only minor disc bulging. Mild facet and ligament flavum hypertrophy. No stenosis.   L5-S1:  Negative.   IMPRESSION: 1. Mild lumbar scoliosis. Progressed L3-L4 disc, endplate, and posterior element degeneration since 2021 with subtle anterolisthesis there. Increased and moderate right neural foraminal stenosis, mild right lateral recess stenosis. Query Right L3 and/or L4 radiculitis. 2. Otherwise mild for age lumbar spine degeneration. No spinal stenosis or other neural impingement.     Electronically Signed   By: Marlise Simpers M.D.   On: 10/30/2023 11:23     Objective:  VS:  HT:    WT:   BMI:     BP:   HR: bpm  TEMP: ( )  RESP:  Physical Exam   Imaging: No results found.

## 2024-04-26 NOTE — Telephone Encounter (Signed)
 PAP: Patient assistance application for Incruse Ellipta  has been approved by PAP Companies: [employer] from 04/19/2024 to 11/27/2024. Medication should be delivered to PAP Delivery: Home. For further shipping updates, please contact [employer] ([employer]) at [phone]. Patient ID is: no patient ID  Medication Shipped 04/23/2024

## 2024-04-26 NOTE — Progress Notes (Signed)
 Pharmacy Medication Assistance Program Note    04/26/2024  Patient ID: [name], female   DOB: Jan 23, 1946, 78 y.o.   MRN: [id number]     12/25/2023 02/06/2024 02/08/2024 04/04/2024  Outreach Medication One  Initial Outreach Date (Medication One) 12/20/2023     Manufacturer Medication One [employer]   [employer] ([employer])  [employer]) [facility]    Incruse  Dose of Incruse    62.5mg   [facility] Ozempic      Dose of Ozempic  4mg /25ml     Type of Museum/gallery curator Assistance  Date Application Sent to Patient 12/27/2023   04/04/2024  Application Items Requested Application;Proof of Income;Other   Application;Proof of Out of Pocket Spend;Proof of Income  Date Application Sent to Prescriber 12/27/2023     Name of Prescriber Katherleen Pancoast [name]  Date Application Received From Patient  02/06/2024  04/18/2024  Application Items Received From Patient  Application;Proof of Income;Other  Proof of Income;Proof of Out of Pocket Spend;Application  Date Application Received From Provider  02/06/2024  04/18/2024  Date Application Submitted to Manufacturer  02/06/2024  04/18/2024  Method Application Sent to Manufacturer  Fax  Fax  Patient Assistance Determination   Approved Approved  Approval Start Date   02/07/2024 04/19/2024  Approval End Date   11/27/2024 11/27/2024  Patient Notification Method   Telephone Call Telephone Call  Telephone Call Outcome   Successful      Signature

## 2024-05-01 ENCOUNTER — Encounter: Payer: Self-pay | Admitting: Allergy and Immunology

## 2024-05-01 ENCOUNTER — Ambulatory Visit: Admitting: Allergy and Immunology

## 2024-05-01 VITALS — BP 130/48 | HR 56 | Temp 98.2°F | Resp 18 | Ht 58.7 in | Wt 138.2 lb

## 2024-05-01 DIAGNOSIS — K219 Gastro-esophageal reflux disease without esophagitis: Secondary | ICD-10-CM | POA: Diagnosis not present

## 2024-05-01 DIAGNOSIS — Z862 Personal history of diseases of the blood and blood-forming organs and certain disorders involving the immune mechanism: Secondary | ICD-10-CM | POA: Diagnosis not present

## 2024-05-01 DIAGNOSIS — J3089 Other allergic rhinitis: Secondary | ICD-10-CM

## 2024-05-01 DIAGNOSIS — J4550 Severe persistent asthma, uncomplicated: Secondary | ICD-10-CM

## 2024-05-01 DIAGNOSIS — B9789 Other viral agents as the cause of diseases classified elsewhere: Secondary | ICD-10-CM

## 2024-05-01 DIAGNOSIS — J988 Other specified respiratory disorders: Secondary | ICD-10-CM

## 2024-05-01 MED ORDER — FAMOTIDINE 40 MG PO TABS: 40.0000 mg | ORAL_TABLET | Freq: Every day | ORAL | 1 refills | Status: DC

## 2024-05-01 NOTE — Patient Instructions (Signed)
  1. Continue tezepelumab  injections    2. Use the following every day:  A.  Advair 250 - 1 inhalation 2 times per day B.  Incruse - 1 inhalation 1 time per day C.  Flonase  - 1 spray each nostril 1 time per day.   D. Montelukast 10 mg - 1 tablet 1 time per day  3. Continue omeprazole  40 twice a day + Famotidine  40 mg in evening  4. If needed:   A. nasal saline   B. Albuterol  HFA or Duoneb   C. OTC Mucinex  DM 1-2 tablet 1-2 times per day   D. ipratropium 0.06% nasal spray - 1-2 sprays each nostril every 6 hours   5. For this recent event:   A. Check home Covid / Flu swab  B. Prednisone  10 mg - 1 tablet now, 1 tablet daily x 5 days  C. Lots of nasal saline   6. Return to clinic in 6 months or earlier if problem

## 2024-05-01 NOTE — Progress Notes (Unsigned)
 [facility] [place]   Follow-up Note  Referring Provider: Harvest Lineman, MD Primary Provider: Harvest Lineman, MD Date of Office Visit: 05/01/2024  Subjective:   [name] (DOB: June 28, 1946) is a 78 y.o. female who [facility] in re-evaluation of the following:  HPI: [name] returns to this clinic in evaluation of asthma, history of fibrotic lung disease from sarcoidosis, allergic rhinitis, LPR, history of untreated sleep apnea.  I last saw her in this clinic 13 November 2023.  She has been doing very well since her last visit regarding her respiratory tract and had very little issues involving either her upper airway or lower airway while she consistently use a combination of anti-inflammatory agents for her respiratory tract inflammation including tezepelumab  injections, Advair, Incruse, Flonase , montelukast.  And she felt that her reflux was under very good control on her current plan of using omeprazole  and occasionally some famotidine .  Unfortunately, she was around many people this past weekend as she was observing a baseball tournament and on Monday she developed a very severe sore throat which has since resolved but since that point in time she has had some shortness of breath and some nasal congestion and some clear rhinorrhea without any fever, chills, aches, or any other associated systemic or constitutional symptoms.  No one in the household is sick at this point in time.  Allergies as of 05/01/2024       Reactions   Codeine Nausea And Vomiting   Sulfa Antibiotics Other (See Comments)   Allergic per pt's mother         Medication List    albuterol  108 (90 Base) MCG/ACT inhaler Commonly known as: VENTOLIN  HFA Inhale 2 puffs into the lungs every 6 (six) hours as needed for wheezing or shortness of breath.   alendronate 70 MG tablet Commonly known as: FOSAMAX Take 70 mg by  mouth once a week.   aspirin  EC 81 MG tablet Take 1 tablet by mouth daily.   atorvastatin  40 MG tablet Commonly known as: LIPITOR Take 40 mg by mouth at bedtime.   famotidine  40 MG tablet Commonly known as: PEPCID  Take 1 tablet (40 mg total) by mouth at bedtime.   fluticasone -salmeterol 250-50 MCG/ACT Aepb Commonly known as: ADVAIR Inhale 1 puff into the lungs every 12 (twelve) hours.   furosemide  20 MG tablet Commonly known as: LASIX  Take 1 tablet (20 mg total) by mouth daily.   Incruse Ellipta  62.5 MCG/ACT Aepb Generic drug: umeclidinium bromide  Inhale 1 puff into the lungs daily.   ipratropium-albuterol  0.5-2.5 (3) MG/3ML Soln Commonly known as: DUONEB Take 3 mLs by nebulization every 6 (six) hours as needed (shortness of breath/wheezing).   levocetirizine 5 MG tablet Commonly known as: XYZAL Take 5 mg by mouth at bedtime.   metoprolol  succinate 25 MG 24 hr tablet Commonly known as: TOPROL -XL Take 1 tablet (25 mg total) by mouth at bedtime.   montelukast 10 MG tablet Commonly known as: SINGULAIR Take 10 mg by mouth daily.   omeprazole  40 MG capsule Commonly known as: PRILOSEC Take 40 mg by mouth 2 (two) times daily.   Ozempic  (0.25 or 0.5 MG/DOSE) 2 MG/1.5ML Sopn Generic drug: Semaglutide (0.25 or 0.5MG /DOS) Inject 0.25 mg into the skin once a week.   PRESERVISION AREDS PO Take by mouth in the morning and at bedtime.   Telmisartan -amLODIPine  80-10 MG Tabs Take 1 tablet by mouth daily.    Past  Medical History:  Diagnosis Date   ABDOMINAL PAIN RIGHT LOWER QUADRANT 03/03/2010   Qualifier: Diagnosis of  By: Grandville Lax MD, Raj Burdock    Acquired trigger finger 04/20/2016   Age-related osteoporosis without current pathological fracture 01/23/2017   Allergy    Anxiety    Arthritis    Asthma    Bilateral carotid artery stenosis 01/16/2020   Bilateral primary osteoarthritis of knee 10/16/2018   Cataract    bilateral and removed   Change in bowel function  11/07/2022   Chest pain 11/01/2013   Closed fracture of distal end of radius 06/05/2016   Closed fracture of nasal bones 04/26/2023   Last Assessment & Plan: Formatting of this note might be different from the original. Nasal trauma. See history of present illness.  Denies nasal obstruction or concern over the appearance of the nose.  CT is reviewed and shows minimally displaced right nasal bone fracture. EXAM shows nasal dorsum in the midline.  Still little edema of the nasal dorsum.  Intranasal exam shows midline nasal septum.   DDD (degenerative disc disease), lumbar 01/23/2017   DECREASED HEARING 05/02/2008   Qualifier: Diagnosis of   By: Misty Amour CMA (AAMA), Dottie      Replacing diagnoses that were inactivated after the 02/27/23 regulatory import   Depression    Diabetes mellitus type II, non insulin  dependent (HCC) 07/05/2019   DM 03/03/2010   Qualifier: Diagnosis of  By: Felipe Horton CMA, Loetta Ringer     DM (diabetes mellitus) (HCC)    DOE (dyspnea on exertion) 05/06/2019   Dyslipidemia 01/23/2017   Dyspnea    Esophageal reflux    ESOPHAGITIS 05/02/2008   Qualifier: Diagnosis of   By: Misty Amour CMA (AAMA), Dottie      Replacing diagnoses that were inactivated after the 02/27/23 regulatory import   Essential hypertension 05/02/2008   Qualifier: Diagnosis of  By: Nelson-Smith CMA (AAMA), Dottie     Full incontinence of feces 03/03/2010   Qualifier: Diagnosis of  By: Grandville Lax MD, Raj Burdock    H/O adenomatous polyp of colon 2011   H/O allergic rhinitis 07/29/2015   Hearing loss, mixed, bilateral 12/17/2018   Heart murmur    no cardiologist, family Dr. Lucillie Saba   HIATAL HERNIA 05/02/2008   Qualifier: Diagnosis of   By: Misty Amour CMA (AAMA), Dottie      Replacing diagnoses that were inactivated after the 02/27/23 regulatory import   History of asthma 01/23/2017   History of depression 01/23/2017   History of diabetes mellitus 01/23/2017   History of gastroesophageal reflux (GERD) 01/17/2017    History of kidney stones    History of transient ischemic attack (TIA) 01/16/2020   Hyperlipidemia    Irritable bowel syndrome    Kidney stones    LPRD (laryngopharyngeal reflux disease) 07/29/2015   Mallory-Weiss tear 07/03/2022   Mild aortic stenosis 07/05/2019   Mitral regurgitation 07/05/2019   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 Here for medical clearance for the right ear. Noted to have recent worsening of her right ear on audiogram at an outside facility.  Denies any pain.  In general, she is functioning well with her current hearing aid though she is looking at getting a new one.  Left ear with profound loss from prior surgery.  Audio   Murmur 11/01/2013   Normocytic anemia 01/16/2020   Obstructive lung disease (  HCC) 07/29/2015   OSA (obstructive sleep apnea) 09/28/2018   Pain in left wrist 06/06/2016   Pedal edema 05/06/2019   Primary osteoarthritis of both feet 01/17/2017   Primary osteoarthritis of both knees 01/17/2017   Primary osteoarthritis, left shoulder 12/11/2017   RECTAL INCONTINENCE 05/02/2008   Qualifier: Diagnosis of   By: Misty Amour CMA (AAMA), Dottie      Replacing diagnoses that were inactivated after the 02/27/23 regulatory import   Retained orthopedic hardware 11/14/2016   Rheumatoid factor positive 12/13/2016   Sarcoidosis    Severe persistent asthma 11/16/2007   Annotation: chronic Qualifier: Diagnosis of  By: Waymond Hailey MD, Nadene Aures    Shoulder arthritis 02/22/2018   Sleep apnea    no cpap now   Status post dilation of esophageal narrowing    Stiffness of left wrist joint 06/06/2016   Stroke (HCC)    Vitamin D  deficiency 01/17/2017    Past Surgical History:  Procedure Laterality Date   BIOPSY  12/28/2023   Procedure: BIOPSY;  Surgeon: Daina Drum, MD;  Location: [facility];  Service:  Gastroenterology;;   BLADDER SURGERY     CATARACT EXTRACTION Bilateral    COLONOSCOPY WITH PROPOFOL  N/A 12/28/2023   Procedure: COLONOSCOPY WITH PROPOFOL ;  Surgeon: Daina Drum, MD;  Location: [facility];  Service: Gastroenterology;  Laterality: N/A;   ESOPHAGOGASTRODUODENOSCOPY (EGD) WITH PROPOFOL  N/A 12/28/2023   Procedure: ESOPHAGOGASTRODUODENOSCOPY (EGD) WITH PROPOFOL ;  Surgeon: Daina Drum, MD;  Location: [facility];  Service: Gastroenterology;  Laterality: N/A;   HARDWARE REMOVAL Left 01/12/2017   Procedure: HARDWARE REMOVAL left wrist;  Surgeon: Brunilda Capra, MD;  Location: [facility];  Service: Orthopedics;  Laterality: Left;   HEMORRHOID SURGERY     NASAL SEPTUM SURGERY     PARTIAL HYSTERECTOMY     POLYPECTOMY  12/28/2023   Procedure: POLYPECTOMY;  Surgeon: Daina Drum, MD;  Location: [facility];  Service: Gastroenterology;;   REVERSE SHOULDER ARTHROPLASTY Left 02/22/2018   REVERSE SHOULDER ARTHROPLASTY Left 02/22/2018   Procedure: LEFT REVERSE SHOULDER ARTHROPLASTY;  Surgeon: Jasmine Mesi, MD;  Location: [facility];  Service: Orthopedics;  Laterality: Left;   TONSILLECTOMY AND ADENOIDECTOMY     TRANSCAROTID ARTERY REVASCULARIZATION  Left 01/17/2020   Procedure: TRANSCAROTID ARTERY REVASCULARIZATION;  Surgeon: Margherita Shell, MD;  Location: [facility];  Service: Vascular;  Laterality: Left;   TRIGGER FINGER RELEASE Left 05/12/2016   Procedure: RELEASE TRIGGER FINGER/A-1 PULLEY INFECTION LEFT RING TENDON SHEATH INJECT RIGHT INDEX FINGER;  Surgeon: Brunilda Capra, MD;  Location: [facility];  Service: Orthopedics;  Laterality: Left;   TUBAL LIGATION      Review of systems negative except as noted in HPI / PMHx or noted below:  Review of Systems  Constitutional: Negative.   HENT: Negative.    Eyes: Negative.   Respiratory: Negative.    Cardiovascular: Negative.   Gastrointestinal: Negative.   Genitourinary: Negative.   Musculoskeletal:  Negative.   Skin: Negative.   Neurological: Negative.   Endo/Heme/Allergies: Negative.   Psychiatric/Behavioral: Negative.       Objective:   Vitals:   05/01/24 1351  BP: (!) 130/48  Pulse: (!) 56  Resp: 18  Temp: 98.2 F (36.8 C)  SpO2: 98%   Height: 4' 10.7" (149.1 cm)  Weight: 138 lb 3.2 oz (62.7 kg)   Physical Exam Constitutional:      Appearance: She is not diaphoretic.  HENT:     Head: Normocephalic.     Right Ear:  Tympanic membrane, ear canal and external ear normal.     Left Ear: Tympanic membrane, ear canal and external ear normal.     Nose: Nose normal. No mucosal edema or rhinorrhea.     Mouth/Throat:     Pharynx: Uvula midline. No oropharyngeal exudate.  Eyes:     Conjunctiva/sclera: Conjunctivae normal.  Neck:     Thyroid : No thyromegaly.     Trachea: Trachea normal. No tracheal tenderness or tracheal deviation.  Cardiovascular:     Rate and Rhythm: Normal rate and regular rhythm.     Heart sounds: S1 normal and S2 normal. Murmur (systolic) heard.  Pulmonary:     Effort: No respiratory distress.     Breath sounds: Normal breath sounds. No stridor. No wheezing or rales.  Lymphadenopathy:     Head:     Right side of head: No tonsillar adenopathy.     Left side of head: No tonsillar adenopathy.     Cervical: No cervical adenopathy.  Skin:    Findings: No erythema or rash.     Nails: There is no clubbing.  Neurological:     Mental Status: She is alert.     Diagnostics: Spirometry was performed and demonstrated an FEV1 of 1.13 at 67 % of predicted.   Assessment and Plan:   1. Severe persistent asthma without complication   2. History of sarcoidosis   3. Other allergic rhinitis   4. LPRD (laryngopharyngeal reflux disease)   5. Viral respiratory infection    1. Continue tezepelumab  injections    2. Use the following every day:  A.  Advair 250 - 1 inhalation 2 times per day B.  Incruse - 1 inhalation 1 time per day C.  Flonase  - 1 spray  each nostril 1 time per day.   D. Montelukast 10 mg - 1 tablet 1 time per day  3. Continue omeprazole  40 twice a day + Famotidine  40 mg in evening  4. If needed:   A. nasal saline   B. Albuterol  HFA or Duoneb   C. OTC Mucinex  DM 1-2 tablet 1-2 times per day   D. ipratropium 0.06% nasal spray - 1-2 sprays each nostril every 6 hours   5. For this recent event:   A. Check home Covid / Flu swab  B. Prednisone  10 mg - 1 tablet now, 1 tablet daily x 5 days  C. Lots of nasal saline   6. Return to clinic in 6 months or earlier if problem  [name] was doing quite well up until the past several days at which point in time she developed what appears to be a viral respiratory tract infection.  She will continue to use anti-inflammatory agents for her respiratory tract and continue to address her issues with reflux with therapy noted above and for her viral respiratory tract infection we will have her check a COVID/flu swab and have given her a very low-dose of systemic steroids to help with the inflammatory component of this issue.  If she does well I will see her back in this clinic in 6 months or earlier if there is a problem.   Schuyler Custard, MD Allergy / Immunology [facility]

## 2024-05-02 ENCOUNTER — Encounter: Payer: Self-pay | Admitting: Allergy and Immunology

## 2024-05-13 ENCOUNTER — Ambulatory Visit: Payer: Medicare HMO | Admitting: Allergy and Immunology

## 2024-05-13 DIAGNOSIS — J4551 Severe persistent asthma with (acute) exacerbation: Secondary | ICD-10-CM | POA: Diagnosis not present

## 2024-05-13 DIAGNOSIS — J180 Bronchopneumonia, unspecified organism: Secondary | ICD-10-CM | POA: Diagnosis not present

## 2024-05-13 DIAGNOSIS — Z6824 Body mass index (BMI) 24.0-24.9, adult: Secondary | ICD-10-CM | POA: Diagnosis not present

## 2024-05-23 ENCOUNTER — Ambulatory Visit (INDEPENDENT_AMBULATORY_CARE_PROVIDER_SITE_OTHER): Admitting: *Deleted

## 2024-05-23 DIAGNOSIS — J4550 Severe persistent asthma, uncomplicated: Secondary | ICD-10-CM | POA: Diagnosis not present

## 2024-05-27 DIAGNOSIS — N3946 Mixed incontinence: Secondary | ICD-10-CM | POA: Diagnosis not present

## 2024-05-27 DIAGNOSIS — R350 Frequency of micturition: Secondary | ICD-10-CM | POA: Diagnosis not present

## 2024-06-06 DIAGNOSIS — E1159 Type 2 diabetes mellitus with other circulatory complications: Secondary | ICD-10-CM | POA: Diagnosis not present

## 2024-06-06 DIAGNOSIS — E1169 Type 2 diabetes mellitus with other specified complication: Secondary | ICD-10-CM | POA: Diagnosis not present

## 2024-06-06 DIAGNOSIS — E785 Hyperlipidemia, unspecified: Secondary | ICD-10-CM | POA: Diagnosis not present

## 2024-06-06 DIAGNOSIS — E1165 Type 2 diabetes mellitus with hyperglycemia: Secondary | ICD-10-CM | POA: Diagnosis not present

## 2024-06-10 ENCOUNTER — Ambulatory Visit: Admitting: Podiatry

## 2024-06-20 ENCOUNTER — Other Ambulatory Visit: Payer: Self-pay

## 2024-06-20 ENCOUNTER — Encounter (HOSPITAL_COMMUNITY): Payer: Self-pay | Admitting: Emergency Medicine

## 2024-06-20 ENCOUNTER — Ambulatory Visit: Payer: Self-pay

## 2024-06-20 ENCOUNTER — Emergency Department (HOSPITAL_COMMUNITY)
Admission: EM | Admit: 2024-06-20 | Discharge: 2024-06-20 | Disposition: A | Discharge status: Home / Self Care | Attending: Emergency Medicine | Admitting: Emergency Medicine

## 2024-06-20 ENCOUNTER — Ambulatory Visit

## 2024-06-20 DIAGNOSIS — Z7982 Long term (current) use of aspirin: Secondary | ICD-10-CM | POA: Insufficient documentation

## 2024-06-20 DIAGNOSIS — R001 Bradycardia, unspecified: Secondary | ICD-10-CM | POA: Diagnosis not present

## 2024-06-20 DIAGNOSIS — M5441 Lumbago with sciatica, right side: Secondary | ICD-10-CM | POA: Diagnosis not present

## 2024-06-20 DIAGNOSIS — M545 Low back pain, unspecified: Secondary | ICD-10-CM | POA: Diagnosis not present

## 2024-06-20 DIAGNOSIS — M5431 Sciatica, right side: Secondary | ICD-10-CM

## 2024-06-20 DIAGNOSIS — M549 Dorsalgia, unspecified: Secondary | ICD-10-CM | POA: Diagnosis not present

## 2024-06-20 DIAGNOSIS — I959 Hypotension, unspecified: Secondary | ICD-10-CM | POA: Diagnosis not present

## 2024-06-20 MED ORDER — OXYCODONE-ACETAMINOPHEN 5-325 MG PO TABS: 1.0000 | ORAL_TABLET | Freq: Four times a day (QID) | ORAL | Status: DC | PRN

## 2024-06-20 MED ORDER — METHYLPREDNISOLONE 4 MG PO TBPK: ORAL_TABLET | ORAL | 0 refills | Status: DC

## 2024-06-20 MED ORDER — OXYCODONE-ACETAMINOPHEN 5-325 MG PO TABS: 1.0000 | ORAL_TABLET | Freq: Four times a day (QID) | ORAL | 0 refills | Status: DC | PRN

## 2024-06-20 MED ORDER — OXYCODONE-ACETAMINOPHEN 5-325 MG PO TABS
1.0000 | ORAL_TABLET | Freq: Once | ORAL | Status: AC
  Administered 2024-06-20: 1 via ORAL
  Filled 2024-06-20: qty 1

## 2024-06-20 NOTE — ED Provider Notes (Signed)
 [place] EMERGENCY DEPARTMENT AT [facility] Provider Note   CSN: [id number] Arrival date & time: 06/20/24  1110     Patient presents with: Back Pain   [name] is a 78 y.o. female.   HPI Patient has had low back pain with radiation into the right leg for about 3 to 4 weeks.  Symptoms have been worsening.  Patient reports she got no relief from pain today.  She reports once before she had pain that was on the left side with radiating down the leg but now is occurring on the right.  No abdominal pain, no urinary urgency or frequency.    Prior to Admission medications   Medication Sig Start Date End Date Taking? Authorizing Provider  methylPREDNISolone  (MEDROL  DOSEPAK) 4 MG TBPK tablet Take per Dosepak instructions 06/20/24  Yes Pearce Littlefield, Ludivina, MD  oxyCODONE -acetaminophen  (PERCOCET/ROXICET) 5-325 MG tablet Take 1 tablet by mouth every 6 (six) hours as needed for severe pain (pain score 7-10). 06/20/24  Yes Shaheen Star, Ludivina, MD  albuterol  (VENTOLIN  HFA) 108 (90 Base) MCG/ACT inhaler Inhale 2 puffs into the lungs every 6 (six) hours as needed for wheezing or shortness of breath.    [provider]  alendronate (FOSAMAX) 70 MG tablet Take 70 mg by mouth once a week. 12/15/21   [provider]  aspirin  81 MG EC tablet Take 1 tablet by mouth daily.    [provider]  atorvastatin  (LIPITOR) 40 MG tablet Take 40 mg by mouth at bedtime. 07/21/20   [provider]  famotidine  (PEPCID ) 40 MG tablet Take 1 tablet (40 mg total) by mouth at bedtime. 05/01/24   Kozlow, Camellia PARAS, MD  fluticasone -salmeterol (ADVAIR) 250-50 MCG/ACT AEPB Inhale 1 puff into the lungs every 12 (twelve) hours. 03/22/24   Hope Almarie ORN, NP  furosemide  (LASIX ) 20 MG tablet Take 1 tablet (20 mg total) by mouth daily. 03/08/24 06/06/24  Carlin [name] BROCKS, NP  ipratropium-albuterol  (DUONEB) 0.5-2.5 (3) MG/3ML SOLN Take 3 mLs by nebulization every 6 (six) hours as needed (shortness  of breath/wheezing). 12/04/23   Kozlow, Eric J, MD  levocetirizine (XYZAL) 5 MG tablet Take 5 mg by mouth at bedtime. 01/31/19   [provider]  metoprolol  succinate (TOPROL -XL) 25 MG 24 hr tablet Take 1 tablet (25 mg total) by mouth at bedtime. 03/08/24   Carlin [name] BROCKS, NP  montelukast (SINGULAIR) 10 MG tablet Take 10 mg by mouth daily. 02/20/19   [provider]  Multiple Vitamins-Minerals (PRESERVISION AREDS PO) Take by mouth in the morning and at bedtime.    [provider]  omeprazole  (PRILOSEC) 40 MG capsule Take 40 mg by mouth 2 (two) times daily. 11/10/22   [provider]  Semaglutide ,0.25 or 0.5MG /DOS, (OZEMPIC , 0.25 OR 0.5 MG/DOSE,) 2 MG/1.5ML SOPN Inject 0.25 mg into the skin once a week. 12/28/23   Federico Rosario BROCKS, MD  Telmisartan -amLODIPine  80-10 MG TABS Take 1 tablet by mouth daily. 02/16/24   Carlin [name] BROCKS, NP  umeclidinium bromide  (INCRUSE ELLIPTA ) 62.5 MCG/ACT AEPB Inhale 1 puff into the lungs daily. 11/20/23   Kozlow, Camellia PARAS, MD    Allergies: Codeine and Sulfa antibiotics    Review of Systems  Updated Vital Signs BP (!) 156/60   Pulse 63   Temp 98.7 F (37.1 C) (Oral)   Resp 18   SpO2 95%   Physical Exam Constitutional:      Comments: Alert nontoxic well in appearance.  HENT:     Mouth/Throat:  Pharynx: Oropharynx is clear.  Eyes:     Extraocular Movements: Extraocular movements intact.  Cardiovascular:     Rate and Rhythm: Normal rate and regular rhythm.  Pulmonary:     Effort: Pulmonary effort is normal.     Breath sounds: Normal breath sounds.  Abdominal:     General: There is no distension.     Palpations: Abdomen is soft.     Tenderness: There is no abdominal tenderness. There is no guarding.  Musculoskeletal:     Comments: Metric lower extremities.  No peripheral edema.  No joint effusions.  Both extremities are warm and dry with good musculature.  Dorsalis pedis pulses are 2+ and symmetric.  Patient has intact  flexion and extension spontaneously to move about in the stretcher.  She endorses severe pain to palpation over the left SI joint.  Soft tissues are normal.  Skin:    General: Skin is warm and dry.  Neurological:     General: No focal deficit present.     Motor: No weakness.     Coordination: Coordination normal.     (all labs ordered are listed, but only abnormal results are displayed) Labs Reviewed - No data to display  EKG: None  Radiology: No results found.   Procedures   Medications Ordered in the ED  oxyCODONE -acetaminophen  (PERCOCET/ROXICET) 5-325 MG per tablet 1 tablet (1 tablet Oral Given 06/20/24 1324)                                    Medical Decision Making Risk Prescription drug management.  Patient resents as outlined.  History and examination are consistent with sciatica.  Patient clearly indicates SI joint region.  She does not have any weakness or vascular dysfunction on exam.Patient has MRI from 12\24 with no significant stenosis or severe impingements.  At this time with patient otherwise stable I do not feel that additional imaging is needed today.  Mariea will be to treat with Medrol  Dosepak and Percocet for pain control.  I recommended follow-up with spine surgical specialist.     Final diagnoses:  Sciatica of right side    ED Discharge Orders          Ordered    methylPREDNISolone  (MEDROL  DOSEPAK) 4 MG TBPK tablet        06/20/24 1335    oxyCODONE -acetaminophen  (PERCOCET/ROXICET) 5-325 MG tablet  Every 6 hours PRN        06/20/24 1335               Armenta Canning, MD 06/22/24 1749

## 2024-06-20 NOTE — ED Triage Notes (Signed)
 BIB [facility] from [facility].  Called her doctor at 9am and explained pain had been going on x 1 month.  Has been taking meds for sciatica without relief.  D[place] to restaurant to find someone to take her to hospital but ended up calling EMS.  A& O.  VSS.  Back pain radiating down leg.  CBG 139

## 2024-06-20 NOTE — Discharge Instructions (Signed)
 1.  Start the Medrol  Dosepak per package instructions. 2.  You may take 1 Percocet every 6 hours for additional pain control.  You may also use over-the-counter pain patches in the area of most pain in your back. 3.  Schedule a follow-up appointment with your family doctor.  You have also been given information about contacting [facility] for back pain. 4.  Return to the emergency department if you have weakness of the leg preventing you from walking, numbness or other worsening changes.

## 2024-06-20 NOTE — Telephone Encounter (Signed)
 FYI Only or Action Required?: FYI only for provider.  Patient is followed in Pulmonology for Obstructive Lung Disease, OSA last seen on 03/22/2024 by Hope Almarie ORN, NP.  Called Nurse Triage reporting No chief complaint on file..  Symptoms began several weeks ago.  Interventions attempted: Prescription medications: Pregabalin .  Symptoms are: gradually worsening.  Triage Disposition: Go to ED Now (Notify PCP)  Patient/caregiver understands and will follow disposition?: Yes   Copied from CRM [id number]. Topic: Clinical - Red Word Triage >> Jun 20, 2024  9:11 AM Leila C wrote: Red Word that prompted transfer to Nurse Triage: Patient [phone] went to pcp for the back pain on left side down the leg, diagnosis with sciatica pain. Patient states the back pain in the right side radiating up and down. Patient cannot stand or walk, too painful. Patient denies fever nor dizziness. Patient sees Dr. Neda, please advise.  Reason for Disposition  [1] Loss of bladder or bowel control (urine or bowel incontinence; wetting self, leaking stool) AND [2] new-onset  Answer Assessment - Initial Assessment Questions 1. ONSET: When did the pain begin? (e.g., minutes, hours, days)     Four weeks  2. LOCATION: Where does it hurt? (upper, mid or lower back)     Back Pain  3. SEVERITY: How bad is the pain?  (e.g., Scale 1-10; mild, moderate, or severe)     Moderate  4. PATTERN: Is the pain constant? (e.g., yes, no; constant, intermittent)      Constant  5. RADIATION: Does the pain shoot into your legs or somewhere else?     Hip, Down the groin  6. CAUSE:  What do you think is causing the back pain?      Unsure  7. BACK OVERUSE:  Any recent lifting of heavy objects, strenuous work or exercise?     No  8. MEDICINES: What have you taken so far for the pain? (e.g., nothing, acetaminophen , NSAIDS)     Lyrica, no relief  9. NEUROLOGIC SYMPTOMS: Do you have any weakness,  numbness, or problems with bowel/bladder control?     Numbness in right hand, Gait Disturbance  10. OTHER SYMPTOMS: Do you have any other symptoms? (e.g., fever, abdomen pain, burning with urination, blood in urine)       Restless  11. PREGNANCY: Is there any chance you are pregnant? When was your last menstrual period?       NO and No  Protocols used: Back Pain-A-AH

## 2024-06-21 ENCOUNTER — Encounter: Admitting: Pulmonary Disease

## 2024-06-24 DIAGNOSIS — R3121 Asymptomatic microscopic hematuria: Secondary | ICD-10-CM | POA: Diagnosis not present

## 2024-06-24 DIAGNOSIS — Z6826 Body mass index (BMI) 26.0-26.9, adult: Secondary | ICD-10-CM | POA: Diagnosis not present

## 2024-06-24 DIAGNOSIS — M4726 Other spondylosis with radiculopathy, lumbar region: Secondary | ICD-10-CM | POA: Diagnosis not present

## 2024-06-25 ENCOUNTER — Ambulatory Visit (INDEPENDENT_AMBULATORY_CARE_PROVIDER_SITE_OTHER): Admitting: *Deleted

## 2024-06-25 DIAGNOSIS — J4550 Severe persistent asthma, uncomplicated: Secondary | ICD-10-CM

## 2024-07-01 DIAGNOSIS — G4733 Obstructive sleep apnea (adult) (pediatric): Secondary | ICD-10-CM | POA: Diagnosis not present

## 2024-07-02 ENCOUNTER — Ambulatory Visit: Admitting: Podiatry

## 2024-07-02 DIAGNOSIS — M79674 Pain in right toe(s): Secondary | ICD-10-CM | POA: Diagnosis not present

## 2024-07-02 DIAGNOSIS — M79675 Pain in left toe(s): Secondary | ICD-10-CM | POA: Diagnosis not present

## 2024-07-02 DIAGNOSIS — E119 Type 2 diabetes mellitus without complications: Secondary | ICD-10-CM

## 2024-07-02 DIAGNOSIS — M5386 Other specified dorsopathies, lumbar region: Secondary | ICD-10-CM | POA: Diagnosis not present

## 2024-07-02 DIAGNOSIS — M1712 Unilateral primary osteoarthritis, left knee: Secondary | ICD-10-CM | POA: Diagnosis not present

## 2024-07-02 DIAGNOSIS — B351 Tinea unguium: Secondary | ICD-10-CM | POA: Diagnosis not present

## 2024-07-02 DIAGNOSIS — Z6825 Body mass index (BMI) 25.0-25.9, adult: Secondary | ICD-10-CM | POA: Diagnosis not present

## 2024-07-02 DIAGNOSIS — M5440 Lumbago with sciatica, unspecified side: Secondary | ICD-10-CM

## 2024-07-02 NOTE — Progress Notes (Unsigned)
  Subjective:  Patient ID: [name], female    DOB: Feb 22, 1946,  MRN: [id number]  Chief Complaint  Patient presents with   [facility]    [facility] with possible callous on the tips of a couple toes. Last A1c was 5.7 in May, ASA.     78 y.o. female presents with the above complaint. History confirmed with patient. Patient presenting with pain related to dystrophic thickened elongated nails. Patient is unable to trim own nails related to nail dystrophy and/or mobility issues. Patient does have a history of T2DM.  Last A1c 5.7.  She does report ongoing pains of both lower extremities radiating into the buttocks.  Actually went to the ED for this a couple weeks ago and saw a back specialist, they advised MRI if this continues.  Objective:  Physical Exam: warm, good capillary refill nail exam onychomycosis of the toenails, onycholysis, and dystrophic nails DP pulses palpable, PT pulses palpable, and protective sensation intact, pain with leg raise Left Foot:  Pain with palpation of nails due to elongation and dystrophic growth.  Right Foot: Pain with palpation of nails due to elongation and dystrophic growth.   Assessment:   1. Pain due to onychomycosis of toenails of both feet   2. Diabetes mellitus type II, non insulin  dependent (HCC)   3. Acute low back pain with sciatica, sciatica laterality unspecified, unspecified back pain laterality      Plan:  Patient was evaluated and treated and all questions answered.  # Sciatica and low back pain affecting both lower extremities - Etiology discussed with patient - Recommend home stretching exercises in the interim - Information for [facility] given to patient for her to contact them regarding MRI  #Onychomycosis with pain  -Nails palliatively debrided as below. -Educated on self-care  Procedure: Nail Debridement Rationale: Pain Type of Debridement: manual, sharp debridement. Instrumentation: Nail nipper,  rotary burr. Number of Nails: 10  Patient educated on diabetes. Discussed proper diabetic foot care and discussed risks and complications of disease. Educated patient in depth on reasons to return to the office immediately should he/she discover anything concerning or new on the feet. All questions answered. Discussed proper shoes as well.    Return in about 3 months (around 10/02/2024) for Diabetic Foot Care.         Ethan Saddler, DPM [facility] / [facility]

## 2024-07-02 NOTE — Patient Instructions (Addendum)
 [facility] Address: [address], [place], [place] [address] Phone: [phone]

## 2024-07-03 ENCOUNTER — Encounter: Payer: Self-pay | Admitting: Podiatry

## 2024-07-04 ENCOUNTER — Other Ambulatory Visit: Payer: Self-pay | Admitting: Family Medicine

## 2024-07-04 DIAGNOSIS — M5386 Other specified dorsopathies, lumbar region: Secondary | ICD-10-CM

## 2024-07-15 DIAGNOSIS — M5386 Other specified dorsopathies, lumbar region: Secondary | ICD-10-CM | POA: Diagnosis not present

## 2024-07-15 DIAGNOSIS — J4550 Severe persistent asthma, uncomplicated: Secondary | ICD-10-CM | POA: Diagnosis not present

## 2024-07-15 DIAGNOSIS — Z6825 Body mass index (BMI) 25.0-25.9, adult: Secondary | ICD-10-CM | POA: Diagnosis not present

## 2024-07-19 ENCOUNTER — Ambulatory Visit
Admission: RE | Admit: 2024-07-19 | Discharge: 2024-07-19 | Disposition: A | Discharge status: Home / Self Care | Source: Ambulatory Visit | Attending: Family Medicine | Admitting: Family Medicine

## 2024-07-19 DIAGNOSIS — M5386 Other specified dorsopathies, lumbar region: Secondary | ICD-10-CM

## 2024-07-23 ENCOUNTER — Ambulatory Visit (INDEPENDENT_AMBULATORY_CARE_PROVIDER_SITE_OTHER)

## 2024-07-23 DIAGNOSIS — J4550 Severe persistent asthma, uncomplicated: Secondary | ICD-10-CM

## 2024-08-02 ENCOUNTER — Other Ambulatory Visit: Payer: Self-pay

## 2024-08-02 ENCOUNTER — Encounter (HOSPITAL_COMMUNITY): Payer: Self-pay

## 2024-08-02 ENCOUNTER — Emergency Department (HOSPITAL_COMMUNITY)
Admission: EM | Admit: 2024-08-02 | Discharge: 2024-08-02 | Disposition: A | Discharge status: Home / Self Care | Source: Ambulatory Visit

## 2024-08-02 ENCOUNTER — Emergency Department (HOSPITAL_COMMUNITY)

## 2024-08-02 DIAGNOSIS — R319 Hematuria, unspecified: Secondary | ICD-10-CM | POA: Diagnosis not present

## 2024-08-02 DIAGNOSIS — R109 Unspecified abdominal pain: Secondary | ICD-10-CM | POA: Diagnosis not present

## 2024-08-02 DIAGNOSIS — R3129 Other microscopic hematuria: Secondary | ICD-10-CM | POA: Insufficient documentation

## 2024-08-02 DIAGNOSIS — N3091 Cystitis, unspecified with hematuria: Secondary | ICD-10-CM | POA: Diagnosis not present

## 2024-08-02 DIAGNOSIS — Z7982 Long term (current) use of aspirin: Secondary | ICD-10-CM | POA: Insufficient documentation

## 2024-08-02 DIAGNOSIS — M549 Dorsalgia, unspecified: Secondary | ICD-10-CM | POA: Diagnosis not present

## 2024-08-02 DIAGNOSIS — R1084 Generalized abdominal pain: Secondary | ICD-10-CM | POA: Diagnosis not present

## 2024-08-02 DIAGNOSIS — M545 Low back pain, unspecified: Secondary | ICD-10-CM | POA: Insufficient documentation

## 2024-08-02 DIAGNOSIS — R3121 Asymptomatic microscopic hematuria: Secondary | ICD-10-CM | POA: Diagnosis not present

## 2024-08-02 DIAGNOSIS — K5730 Diverticulosis of large intestine without perforation or abscess without bleeding: Secondary | ICD-10-CM | POA: Diagnosis not present

## 2024-08-02 LAB — CBC WITH DIFFERENTIAL/PLATELET
Abs Immature Granulocytes: 0.02 K/uL (ref 0.00–0.07)
Basophils Absolute: 0 K/uL (ref 0.0–0.1)
Basophils Relative: 0 %
Eosinophils Absolute: 0.1 K/uL (ref 0.0–0.5)
Eosinophils Relative: 1 %
HCT: 34.1 % — ABNORMAL LOW (ref 36.0–46.0)
Hemoglobin: 11.2 g/dL — ABNORMAL LOW (ref 12.0–15.0)
Immature Granulocytes: 0 %
Lymphocytes Relative: 8 %
Lymphs Abs: 0.7 K/uL (ref 0.7–4.0)
MCH: 30.9 pg (ref 26.0–34.0)
MCHC: 32.8 g/dL (ref 30.0–36.0)
MCV: 93.9 fL (ref 80.0–100.0)
Monocytes Absolute: 0.9 K/uL (ref 0.1–1.0)
Monocytes Relative: 11 %
Neutro Abs: 6.5 K/uL (ref 1.7–7.7)
Neutrophils Relative %: 80 %
Platelets: 227 K/uL (ref 150–400)
RBC: 3.63 MIL/uL — ABNORMAL LOW (ref 3.87–5.11)
RDW: 15 % (ref 11.5–15.5)
WBC: 8.3 K/uL (ref 4.0–10.5)
nRBC: 0 % (ref 0.0–0.2)

## 2024-08-02 LAB — COMPREHENSIVE METABOLIC PANEL WITH GFR
ALT: 7 U/L (ref 0–44)
AST: 21 U/L (ref 15–41)
Albumin: 3.8 g/dL (ref 3.5–5.0)
Alkaline Phosphatase: 81 U/L (ref 38–126)
Anion gap: 15 (ref 5–15)
BUN: 21 mg/dL (ref 8–23)
CO2: 21 mmol/L — ABNORMAL LOW (ref 22–32)
Calcium: 9.2 mg/dL (ref 8.9–10.3)
Chloride: 100 mmol/L (ref 98–111)
Creatinine, Ser: 0.62 mg/dL (ref 0.44–1.00)
GFR, Estimated: >60 mL/min (ref >60)
Glucose, Bld: 126 mg/dL — ABNORMAL HIGH (ref 70–99)
Potassium: 4.1 mmol/L (ref 3.5–5.1)
Sodium: 136 mmol/L (ref 135–145)
Total Bilirubin: 1.2 mg/dL (ref 0.0–1.2)
Total Protein: 6.6 g/dL (ref 6.5–8.1)

## 2024-08-02 LAB — URINALYSIS, ROUTINE W REFLEX MICROSCOPIC
Bacteria, UA: NONE SEEN
Bilirubin Urine: NEGATIVE
Glucose, UA: NEGATIVE mg/dL
Ketones, ur: 5 mg/dL — AB
Nitrite: NEGATIVE
Protein, ur: NEGATIVE mg/dL
Specific Gravity, Urine: 1.009 (ref 1.005–1.030)
pH: 7 (ref 5.0–8.0)

## 2024-08-02 LAB — LIPASE, BLOOD: Lipase: 18 U/L (ref 11–51)

## 2024-08-02 MED ORDER — ONDANSETRON HCL 4 MG/2ML IJ SOLN
4.0000 mg | Freq: Once | INTRAMUSCULAR | Status: AC
  Administered 2024-08-02: 4 mg via INTRAVENOUS
  Filled 2024-08-02: qty 2

## 2024-08-02 MED ORDER — OXYCODONE-ACETAMINOPHEN 5-325 MG PO TABS: 1.0000 | ORAL_TABLET | Freq: Four times a day (QID) | ORAL | 0 refills | Status: DC | PRN

## 2024-08-02 MED ORDER — MORPHINE SULFATE (PF) 4 MG/ML IV SOLN
4.0000 mg | Freq: Once | INTRAVENOUS | Status: AC
  Administered 2024-08-02: 4 mg via INTRAVENOUS
  Filled 2024-08-02: qty 1

## 2024-08-02 NOTE — ED Provider Notes (Signed)
 [place] EMERGENCY DEPARTMENT AT [facility] Provider Note   CSN: [id number] Arrival date & time: 08/02/24  1128     Patient presents with: Back Pain   [name] is a 78 y.o. female.   78 year old female with past medical history of back pain presents emergency department today after being sent in from urgent care due to hematuria.  The patient went there complaining of back pain.  The pain seems more in line with her chronic back pain.  Reports pain is in her lower back and goes down her right leg.  They noticed some hematuria so they sent patient here to evaluate for kidney stones.  The patient states that she does think that she has noticed very minimal bleeding in her urine.  She denies any fevers.  She is not having nausea or vomiting.  She states that she did have an MRI for her low back pain last month which did show some degenerative changes but no findings consistent with cauda equina syndrome.  Patient denies any fevers.  Denies any bowel or bladder dysfunction has not had any saddle anesthesia.   Back Pain      Prior to Admission medications   Medication Sig Start Date End Date Taking? Authorizing Provider  oxyCODONE -acetaminophen  (PERCOCET/ROXICET) 5-325 MG tablet Take 1 tablet by mouth every 6 (six) hours as needed for severe pain (pain score 7-10). 08/02/24  Yes Ula Prentice SAUNDERS, MD  albuterol  (VENTOLIN  HFA) 108 (90 Base) MCG/ACT inhaler Inhale 2 puffs into the lungs every 6 (six) hours as needed for wheezing or shortness of breath.    [provider]  alendronate (FOSAMAX) 70 MG tablet Take 70 mg by mouth once a week. 12/15/21   [provider]  aspirin  81 MG EC tablet Take 1 tablet by mouth daily.    [provider]  atorvastatin  (LIPITOR) 40 MG tablet Take 40 mg by mouth at bedtime. 07/21/20   [provider]  famotidine  (PEPCID ) 40 MG tablet Take 1 tablet (40 mg total) by mouth at bedtime. 05/01/24   Kozlow, Camellia PARAS, MD   fluticasone -salmeterol (ADVAIR) 250-50 MCG/ACT AEPB Inhale 1 puff into the lungs every 12 (twelve) hours. 03/22/24   Hope Almarie ORN, NP  furosemide  (LASIX ) 20 MG tablet Take 1 tablet (20 mg total) by mouth daily. 03/08/24 07/02/24  Carlin [name] BROCKS, NP  ipratropium-albuterol  (DUONEB) 0.5-2.5 (3) MG/3ML SOLN Take 3 mLs by nebulization every 6 (six) hours as needed (shortness of breath/wheezing). 12/04/23   Kozlow, Eric J, MD  levocetirizine (XYZAL) 5 MG tablet Take 5 mg by mouth at bedtime. 01/31/19   [provider]  methylPREDNISolone  (MEDROL  DOSEPAK) 4 MG TBPK tablet Take per Dosepak instructions 06/20/24   Armenta Canning, MD  metoprolol  succinate (TOPROL -XL) 25 MG 24 hr tablet Take 1 tablet (25 mg total) by mouth at bedtime. 03/08/24   Carlin [name] BROCKS, NP  montelukast (SINGULAIR) 10 MG tablet Take 10 mg by mouth daily. 02/20/19   [provider]  Multiple Vitamins-Minerals (PRESERVISION AREDS PO) Take by mouth in the morning and at bedtime.    [provider]  omeprazole  (PRILOSEC) 40 MG capsule Take 40 mg by mouth 2 (two) times daily. 11/10/22   [provider]  oxyCODONE -acetaminophen  (PERCOCET/ROXICET) 5-325 MG tablet Take 1 tablet by mouth every 6 (six) hours as needed for severe pain (pain score 7-10). 06/20/24   Armenta Canning, MD  Semaglutide ,0.25 or 0.5MG /DOS, (OZEMPIC , 0.25 OR 0.5 MG/DOSE,) 2 MG/1.5ML SOPN Inject  0.25 mg into the skin once a week. 12/28/23   Federico Rosario BROCKS, MD  Telmisartan -amLODIPine  80-10 MG TABS Take 1 tablet by mouth daily. 02/16/24   Carlin [name] BROCKS, NP  umeclidinium bromide  (INCRUSE ELLIPTA ) 62.5 MCG/ACT AEPB Inhale 1 puff into the lungs daily. 11/20/23   Kozlow, Camellia PARAS, MD    Allergies: Codeine and Sulfa antibiotics    Review of Systems  Musculoskeletal:  Positive for back pain.  All other systems reviewed and are negative.   Updated Vital Signs BP (!) 174/60   Pulse 73   Temp 98.7 F (37.1 C) (Oral)   Resp 16   Ht 4'  10 (1.473 m)   Wt 62.6 kg   SpO2 100%   BMI 28.84 kg/m   Physical Exam Vitals and nursing note reviewed.   Gen: NAD Eyes: PERRL, EOMI HEENT: no oropharyngeal swelling Neck: trachea midline Resp: clear to auscultation bilaterally Card: RRR, no murmurs, rubs, or gallops Abd: nontender, nondistended Extremities: no calf tenderness, no edema MSK: Patient is tender over the mid and lower lumbar spine with no step-offs or deformities, there is right-sided paraspinal tenderness noted, positive straight leg raise on right Neuro: Equal strength and sensation throughout bilateral lower extremities with normal patellar and Achilles reflexes bilaterally Vascular: 2+ radial pulses bilaterally, 2+ DP pulses bilaterally Skin: no rashes Psyc: acting appropriately   (all labs ordered are listed, but only abnormal results are displayed) Labs Reviewed  URINALYSIS, ROUTINE W REFLEX MICROSCOPIC - Abnormal; Notable for the following components:      Result Value   Hgb urine dipstick MODERATE (*)    Ketones, ur 5 (*)    Leukocytes,Ua TRACE (*)    All other components within normal limits  CBC WITH DIFFERENTIAL/PLATELET - Abnormal; Notable for the following components:   RBC 3.63 (*)    Hemoglobin 11.2 (*)    HCT 34.1 (*)    All other components within normal limits  COMPREHENSIVE METABOLIC PANEL WITH GFR - Abnormal; Notable for the following components:   CO2 21 (*)    Glucose, Bld 126 (*)    All other components within normal limits  LIPASE, BLOOD    EKG: None  Radiology: CT ABDOMEN PELVIS WO CONTRAST Result Date: 08/02/2024 CLINICAL DATA:  Abdominal pain and flank pain. EXAM: CT ABDOMEN AND PELVIS WITHOUT CONTRAST TECHNIQUE: Multidetector CT imaging of the abdomen and pelvis was performed following the standard protocol without IV contrast. RADIATION DOSE REDUCTION: This exam was performed according to the departmental dose-optimization program which includes automated exposure control,  adjustment of the mA and/or kV according to patient size and/or use of iterative reconstruction technique. COMPARISON:  None Available. FINDINGS: Lower chest: Several nodules in the RIGHT middle lobe. Largest pulmonary nodule measures 6 mm along the pleural surface on image 11/series 6. Hepatobiliary: No focal hepatic lesion. Normal gallbladder. No biliary duct dilatation. Common bile duct is normal. Pancreas: Pancreas is normal. No ductal dilatation. No pancreatic inflammation. Spleen: Normal spleen Adrenals/urinary tract: Adrenal glands and kidneys are normal. No nephrolithiasis or ureterolithiasis. The ureters and bladder normal. Stomach/Bowel: Gastric diverticulum just below the GE junction measures 3.6 cm. Stomach, duodenum and small-bowel normal. Appendix not identified. Multiple diverticula of the descending colon and sigmoid colon without acute inflammation. Vascular/Lymphatic: Abdominal aorta is normal caliber. No periportal or retroperitoneal adenopathy. No pelvic adenopathy. Reproductive: Abdominal aorta is normal caliber with atherosclerotic calcification. There is no retroperitoneal or periportal lymphadenopathy. No pelvic lymphadenopathy. Other: No free fluid. Musculoskeletal: No aggressive osseous  lesion. IMPRESSION: 1. No nephrolithiasis, ureterolithiasis or obstructive uropathy. 2. No bladder calculi. 3. Gastric diverticulum. 4. Multiple diverticula of the descending colon and sigmoid colon without acute inflammation. 5. Multiple pulmonary nodules. Most significant: 6 mm right solid pulmonary nodule. Non-contrast chest CT at 6-12 months is recommended. If the nodule is stable at time of repeat CT, then future CT at 18-24 months (from today's scan) is considered optional for low-risk patients, but is recommended for high-risk patients. This recommendation follows the consensus statement: Guidelines for Management of Incidental Pulmonary Nodules Detected on CT Images: From the Fleischner Society 2017;  Radiology 2017; 284:228-243. 6.  Aortic Atherosclerosis (ICD10-I70.0). Electronically Signed   By: Jackquline Boxer M.D.   On: 08/02/2024 13:42     Procedures   Medications Ordered in the ED  morphine  (PF) 4 MG/ML injection 4 mg (4 mg Intravenous Given 08/02/24 1216)  ondansetron  (ZOFRAN ) injection 4 mg (4 mg Intravenous Given 08/02/24 1216)                                    Medical Decision Making 78 year old female with past medical history of chronic low back pain presenting to the emergency department today with back pain and hematuria.  I will further evaluate the patient here with basic labs as well as a noncontrast CT scan to evaluate for ureterolithiasis.  Suspect this is likely secondary to her positive right straight leg raise and her exam.  I will give the patient morphine  and Zofran  for symptoms in the event this is due to ureterolithiasis.  Patient is neurovascularly intact.  Think that if her workup is reassuring that she may be discharged with neurosurgery follow-up with pain medication.  The patient's labs here are reassuring.  CT scan does not show any acute findings.  The patient will be discharged with neurosurgery and urology follow-up for the microscopic hematuria and radicular back pain.  Will provide her a short course of Percocet to take until she is able to follow-up.  She is discharged with return precautions.  Amount and/or Complexity of Data Reviewed Labs: ordered. Radiology: ordered.  Risk Prescription drug management.        Final diagnoses:  Acute low back pain, unspecified back pain laterality, unspecified whether sciatica present  Microscopic hematuria    ED Discharge Orders          Ordered    oxyCODONE -acetaminophen  (PERCOCET/ROXICET) 5-325 MG tablet  Every 6 hours PRN        08/02/24 1357               Ula Prentice SAUNDERS, MD 08/02/24 1401

## 2024-08-02 NOTE — ED Triage Notes (Signed)
 Pt c/o back pain for the past 2-3 weeks. Pt denies any falls or injuries. States she has had a MRI recently. Pt is out of the pain meds she was given from her doctor. Pt went to UC today and they sent her today for possible kidney stone for blood in her urine. Pt denies any burning when she urinates.

## 2024-08-02 NOTE — Discharge Instructions (Addendum)
 Your CT scan did not show any kidney stones or other acute issues.  There was a pulmonary nodule on your CT scan.  Please follow-up with your primary care doctor to have a repeat CT scan in a few months if indicated.  Please call and schedule a follow-up appointment with the urologist for the blood in the urine as well as with the neurosurgeon for the back pain.  Take the Percocet as needed for pain.  Do not drive or drink alcohol taking as it may make you drowsy.  Also start a fiber supplement such as Metamucil or Colace when taking this medication as this can cause constipation.  Please follow-up with the specialist and return to the ER for worsening symptoms.

## 2024-08-05 DIAGNOSIS — G4733 Obstructive sleep apnea (adult) (pediatric): Secondary | ICD-10-CM | POA: Diagnosis not present

## 2024-08-12 DIAGNOSIS — Z6826 Body mass index (BMI) 26.0-26.9, adult: Secondary | ICD-10-CM | POA: Diagnosis not present

## 2024-08-12 DIAGNOSIS — M461 Sacroiliitis, not elsewhere classified: Secondary | ICD-10-CM | POA: Diagnosis not present

## 2024-08-14 ENCOUNTER — Ambulatory Visit: Admitting: Pulmonary Disease

## 2024-08-14 ENCOUNTER — Encounter: Payer: Self-pay | Admitting: Pulmonary Disease

## 2024-08-14 VITALS — BP 184/70 | HR 60 | Ht 60.0 in | Wt 139.0 lb

## 2024-08-14 DIAGNOSIS — G4730 Sleep apnea, unspecified: Secondary | ICD-10-CM | POA: Diagnosis not present

## 2024-08-14 DIAGNOSIS — G4733 Obstructive sleep apnea (adult) (pediatric): Secondary | ICD-10-CM

## 2024-08-14 DIAGNOSIS — J449 Chronic obstructive pulmonary disease, unspecified: Secondary | ICD-10-CM

## 2024-08-14 NOTE — Patient Instructions (Signed)
 Follow-up in about 6 weeks  Try and use your CPAP nightly  Continue Incruse and Advair  Call us  with significant concerns  Graded activities as tolerated

## 2024-08-14 NOTE — Progress Notes (Signed)
 [name]    [id number]    March 19, 1946  Primary Care Physician:[name], [name], MD  Referring Physician: [name], MD [address] [place],  [place] [address]  Chief complaint:    Patient being followed for history of obstructive sleep apnea, chronic obstructive pulmonary disease   HPI:  sleep history of severe obstructive sleep apnea Has not been using CPAP recently - Feels the air feels very cold -Uses a nasal mask - We discussed trying to use the CPAP without humidification  Ways of adjusting the humidification and heating also was discussed She may need to call the DME company to assist with changing the settings  She states her machine is over 73 years old and may need a new one but with previous noncompliance may need a new study to ascertain she still has significant sleep disordered breathing  She does have a history of COPD Longstanding history of asthma as a child  She is supposed to be on Incruse and Advair, she is unsure about the Advair but knows she is on Incruse  She does have a history of pulmonary sarcoidosis with bronchiectasis  Follows up with Dr. Lorn for asthma - On Tezspire    Outpatient Encounter Medications as of 08/14/2024  Medication Sig   albuterol  (VENTOLIN  HFA) 108 (90 Base) MCG/ACT inhaler Inhale 2 puffs into the lungs every 6 (six) hours as needed for wheezing or shortness of breath.   alendronate (FOSAMAX) 70 MG tablet Take 70 mg by mouth once a week.   aspirin  81 MG EC tablet Take 1 tablet by mouth daily.   atorvastatin  (LIPITOR) 40 MG tablet Take 40 mg by mouth at bedtime.   citalopram  (CELEXA ) 20 MG tablet Take 20 mg by mouth daily.   famotidine  (PEPCID ) 40 MG tablet Take 1 tablet (40 mg total) by mouth at bedtime.   fluticasone -salmeterol (ADVAIR) 250-50 MCG/ACT AEPB Inhale 1 puff into the lungs every 12 (twelve) hours.   ipratropium-albuterol  (DUONEB) 0.5-2.5 (3) MG/3ML SOLN Take 3 mLs by nebulization  every 6 (six) hours as needed (shortness of breath/wheezing).   levocetirizine (XYZAL) 5 MG tablet Take 5 mg by mouth at bedtime.   methylPREDNISolone  (MEDROL  DOSEPAK) 4 MG TBPK tablet Take per Dosepak instructions   metoprolol  succinate (TOPROL -XL) 25 MG 24 hr tablet Take 1 tablet (25 mg total) by mouth at bedtime.   montelukast (SINGULAIR) 10 MG tablet Take 10 mg by mouth daily.   Multiple Vitamins-Minerals (PRESERVISION AREDS PO) Take by mouth in the morning and at bedtime.   omeprazole  (PRILOSEC) 40 MG capsule Take 40 mg by mouth 2 (two) times daily.   oxyCODONE -acetaminophen  (PERCOCET/ROXICET) 5-325 MG tablet Take 1 tablet by mouth every 6 (six) hours as needed for severe pain (pain score 7-10).   oxyCODONE -acetaminophen  (PERCOCET/ROXICET) 5-325 MG tablet Take 1 tablet by mouth every 6 (six) hours as needed for severe pain (pain score 7-10).   Semaglutide ,0.25 or 0.5MG /DOS, (OZEMPIC , 0.25 OR 0.5 MG/DOSE,) 2 MG/1.5ML SOPN Inject 0.25 mg into the skin once a week.   Telmisartan -amLODIPine  80-10 MG TABS Take 1 tablet by mouth daily.   umeclidinium bromide  (INCRUSE ELLIPTA ) 62.5 MCG/ACT AEPB Inhale 1 puff into the lungs daily.   furosemide  (LASIX ) 20 MG tablet Take 1 tablet (20 mg total) by mouth daily.   Facility-Administered Encounter Medications as of 08/14/2024  Medication   tezepelumab -ekko (TEZSPIRE ) 210 MG/1. syringe 210 mg    Allergies as of 08/14/2024 - Review Complete 08/14/2024  Allergen Reaction Noted   Codeine Nausea And Vomiting 05/20/2015   Sulfa antibiotics Other (See Comments) 05/20/2015    Past Medical History:  Diagnosis Date   ABDOMINAL PAIN RIGHT LOWER QUADRANT 03/03/2010   Qualifier: Diagnosis of  By: Obie MD, Princella HERO    Acquired trigger finger 04/20/2016   Age-related osteoporosis without current pathological fracture 01/23/2017   Allergy    Anxiety    Arthritis    Asthma    Bilateral carotid artery stenosis 01/16/2020   Bilateral primary osteoarthritis  of knee 10/16/2018   Cataract    bilateral and removed   Change in bowel function 11/07/2022   Chest pain 11/01/2013   Closed fracture of distal end of radius 06/05/2016   Closed fracture of nasal bones 04/26/2023   Last Assessment & Plan: Formatting of this note might be different from the original. Nasal trauma. See history of present illness.  Denies nasal obstruction or concern over the appearance of the nose.  CT is reviewed and shows minimally displaced right nasal bone fracture. EXAM shows nasal dorsum in the midline.  Still little edema of the nasal dorsum.  Intranasal exam shows midline nasal septum.   DDD (degenerative disc disease), lumbar 01/23/2017   DECREASED HEARING 05/02/2008   Qualifier: Diagnosis of   By: Marcelo CMA (AAMA), Dottie      Replacing diagnoses that were inactivated after the 02/27/23 regulatory import   Depression    Diabetes mellitus type II, non insulin  dependent (HCC) 07/05/2019   DM 03/03/2010   Qualifier: Diagnosis of  By: Claudene CMA, Burnard     DM (diabetes mellitus) (HCC)    DOE (dyspnea on exertion) 05/06/2019   Dyslipidemia 01/23/2017   Dyspnea    Esophageal reflux    ESOPHAGITIS 05/02/2008   Qualifier: Diagnosis of   By: Marcelo CMA (AAMA), Dottie      Replacing diagnoses that were inactivated after the 02/27/23 regulatory import   Essential hypertension 05/02/2008   Qualifier: Diagnosis of  By: Nelson-Smith CMA (AAMA), Dottie     Full incontinence of feces 03/03/2010   Qualifier: Diagnosis of  By: Obie MD, Princella HERO    H/O adenomatous polyp of colon 2011   H/O allergic rhinitis 07/29/2015   Hearing loss, mixed, bilateral 12/17/2018   Heart murmur    no cardiologist, family Dr. ardelle   HIATAL HERNIA 05/02/2008   Qualifier: Diagnosis of   By: Marcelo CMA (AAMA), Dottie      Replacing diagnoses that were inactivated after the 02/27/23 regulatory import   History of asthma 01/23/2017   History of depression 01/23/2017   History of  diabetes mellitus 01/23/2017   History of gastroesophageal reflux (GERD) 01/17/2017   History of kidney stones    History of transient ischemic attack (TIA) 01/16/2020   Hyperlipidemia    Irritable bowel syndrome    Kidney stones    LPRD (laryngopharyngeal reflux disease) 07/29/2015   Mallory-Weiss tear 07/03/2022   Mild aortic stenosis 07/05/2019   Mitral regurgitation 07/05/2019   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 Here for medical clearance for the right ear. Noted to have recent worsening of her right ear on audiogram at an outside facility.  Denies any pain.  In general, she is functioning well with her current hearing aid though she is looking at getting a new one.  Left ear  with profound loss from prior surgery.  Audio   Murmur 11/01/2013   Normocytic anemia 01/16/2020   Obstructive lung disease (HCC) 07/29/2015   OSA (obstructive sleep apnea) 09/28/2018   Pain in left wrist 06/06/2016   Pedal edema 05/06/2019   Primary osteoarthritis of both feet 01/17/2017   Primary osteoarthritis of both knees 01/17/2017   Primary osteoarthritis, left shoulder 12/11/2017   RECTAL INCONTINENCE 05/02/2008   Qualifier: Diagnosis of   By: Marcelo CMA (AAMA), Dottie      Replacing diagnoses that were inactivated after the 02/27/23 regulatory import   Retained orthopedic hardware 11/14/2016   Rheumatoid factor positive 12/13/2016   Sarcoidosis    Severe persistent asthma 11/16/2007   Annotation: chronic Qualifier: Diagnosis of  By: Darlean MD, Ozell NOVAK    Shoulder arthritis 02/22/2018   Sleep apnea    no cpap now   Status post dilation of esophageal narrowing    Stiffness of left wrist joint 06/06/2016   Stroke (HCC)    Vitamin D  deficiency 01/17/2017    Past Surgical History:  Procedure Laterality Date   BIOPSY  12/28/2023    Procedure: BIOPSY;  Surgeon: Federico Rosario BROCKS, MD;  Location: [facility];  Service: Gastroenterology;;   BLADDER SURGERY     CATARACT EXTRACTION Bilateral    COLONOSCOPY WITH PROPOFOL  N/A 12/28/2023   Procedure: COLONOSCOPY WITH PROPOFOL ;  Surgeon: Federico Rosario BROCKS, MD;  Location: [facility];  Service: Gastroenterology;  Laterality: N/A;   ESOPHAGOGASTRODUODENOSCOPY (EGD) WITH PROPOFOL  N/A 12/28/2023   Procedure: ESOPHAGOGASTRODUODENOSCOPY (EGD) WITH PROPOFOL ;  Surgeon: Federico Rosario BROCKS, MD;  Location: [facility];  Service: Gastroenterology;  Laterality: N/A;   HARDWARE REMOVAL Left 01/12/2017   Procedure: HARDWARE REMOVAL left wrist;  Surgeon: Franky Curia, MD;  Location: [facility];  Service: Orthopedics;  Laterality: Left;   HEMORRHOID SURGERY     NASAL SEPTUM SURGERY     PARTIAL HYSTERECTOMY     POLYPECTOMY  12/28/2023   Procedure: POLYPECTOMY;  Surgeon: Federico Rosario BROCKS, MD;  Location: [facility];  Service: Gastroenterology;;   REVERSE SHOULDER ARTHROPLASTY Left 02/22/2018   REVERSE SHOULDER ARTHROPLASTY Left 02/22/2018   Procedure: LEFT REVERSE SHOULDER ARTHROPLASTY;  Surgeon: Addie Cordella Hamilton, MD;  Location: [facility];  Service: Orthopedics;  Laterality: Left;   TONSILLECTOMY AND ADENOIDECTOMY     TRANSCAROTID ARTERY REVASCULARIZATION  Left 01/17/2020   Procedure: TRANSCAROTID ARTERY REVASCULARIZATION;  Surgeon: Serene Gaile ORN, MD;  Location: [facility];  Service: Vascular;  Laterality: Left;   TRIGGER FINGER RELEASE Left 05/12/2016   Procedure: RELEASE TRIGGER FINGER/A-1 PULLEY INFECTION LEFT RING TENDON SHEATH INJECT RIGHT INDEX FINGER;  Surgeon: Franky Curia, MD;  Location: [facility];  Service: Orthopedics;  Laterality: Left;   TUBAL LIGATION      Family History  Problem Relation Age of Onset   Esophageal cancer Father    Diabetes Mother    Diabetes Maternal Grandmother    Colon cancer Neg Hx    Rectal cancer Neg Hx    Stomach cancer Neg Hx     Social  History   Socioeconomic History   Marital status: Married    Spouse name: Not on file   Number of children: 2   Years of education: Not on file   Highest education level: Not on file  Occupational History   Occupation: retired  Tobacco Use   Smoking status: Never   Smokeless tobacco: Never  Vaping Use   Vaping status: Never Used  Substance and Sexual  Activity   Alcohol use: No   Drug use: No   Sexual activity: Not on file  Other Topics Concern   Not on file  Social History Narrative   Daily caffeine use.    Social Drivers of Corporate investment banker Strain: Not on file  Food Insecurity: Low Risk  (11/21/2022)   Received from [facility]   Hunger Vital Sign    Within the past 12 months, you worried that your food would run out before you got money to buy more: Never true    Within the past 12 months, the food you bought just didn't last and you didn't have money to get more: Not on file  Transportation Needs: No Transportation Needs (11/21/2022)   Received from [facility]    In the past 12 months, has lack of reliable transportation kept you from medical appointments, meetings, work or from getting things needed for daily living? : No  Physical Activity: Not on file  Stress: Not on file  Social Connections: Not on file  Intimate Partner Violence: Low Risk  (11/21/2022)   Received from [facility] visits prior to 01/28/2023.   Safety    How often does anyone, including family and friends, physically hurt you?: Never    How often does anyone, including family and friends, insult or talk down to you?: Never    How often does anyone, including family and friends, threaten you with harm?: Never    How often does anyone, including family and friends, scream or curse at you?: Never    Review of Systems  Respiratory:  Positive for apnea and shortness of breath.   Psychiatric/Behavioral:  Positive for sleep disturbance.      Vitals:   08/14/24 1356  BP: (!) 184/70  Pulse: 60  SpO2: 100%     Physical Exam Constitutional:      Appearance: Normal appearance.  HENT:     Head: Normocephalic.     Mouth/Throat:     Mouth: Mucous membranes are moist.  Eyes:     General: No scleral icterus. Cardiovascular:     Rate and Rhythm: Normal rate and regular rhythm.     Heart sounds: No murmur heard.    No friction rub.  Pulmonary:     Effort: No respiratory distress.     Breath sounds: No stridor. No wheezing or rhonchi.  Musculoskeletal:     Cervical back: No rigidity or tenderness.  Neurological:     Mental Status: She is alert.  Psychiatric:        Mood and Affect: Mood normal.      Data Reviewed: Sleep study from 2024 reviewed showing AHI of 34, O2 nadir of 70%  Compliance data only shows 4% to use CPAP set at 11 residual AHI of 2.5  PFT 05/01/2024-spirometry with moderate great obstructive disease  Previous office visit records reviewed from [facility], Dr. Carolynne Allan  Assessment/Plan: Chronic obstructive pulmonary disease - Moderate obstruction on last PFT  History of sleep apnea -Encouraged to get back to using CPAP on a regular basis  If machine is dysfunctional - She will need a new sleep study to ascertain she still has significant sleep apnea to qualify for a new machine  Encourage graded activities as tolerated  Encourage compliance with inhalers as tolerated  Follow-up in about 6 weeks  Encouraged to give us  a call with any significant concerns  I spent 30 minutes dedicated to the  care of this patient on the date of this encounter to include previsit review of records, face-to-face time with the patient discussing conditions above, post visit ordering of testing,ordering medications,independentlyinterpreting results, clinical documentation with electronic health record and communicated necessary findings to members of the patient's care team  Jennet Epley  MD [facility] 08/14/2024, 2:21 PM  CC: Clemmie Nest, MD

## 2024-08-20 ENCOUNTER — Ambulatory Visit (INDEPENDENT_AMBULATORY_CARE_PROVIDER_SITE_OTHER): Admitting: *Deleted

## 2024-08-20 DIAGNOSIS — J4550 Severe persistent asthma, uncomplicated: Secondary | ICD-10-CM

## 2024-08-21 ENCOUNTER — Ambulatory Visit: Admitting: Cardiology

## 2024-08-22 DIAGNOSIS — M461 Sacroiliitis, not elsewhere classified: Secondary | ICD-10-CM | POA: Diagnosis not present

## 2024-08-28 DIAGNOSIS — M461 Sacroiliitis, not elsewhere classified: Secondary | ICD-10-CM | POA: Diagnosis not present

## 2024-09-02 ENCOUNTER — Other Ambulatory Visit: Payer: Self-pay

## 2024-09-02 DIAGNOSIS — I639 Cerebral infarction, unspecified: Secondary | ICD-10-CM | POA: Insufficient documentation

## 2024-09-03 ENCOUNTER — Encounter: Payer: Self-pay | Admitting: Cardiology

## 2024-09-03 ENCOUNTER — Ambulatory Visit: Admitting: Cardiology

## 2024-09-03 ENCOUNTER — Ambulatory Visit: Attending: Cardiology | Admitting: Cardiology

## 2024-09-03 VITALS — BP 150/74 | HR 56 | Ht 60.0 in | Wt 133.8 lb

## 2024-09-03 DIAGNOSIS — I350 Nonrheumatic aortic (valve) stenosis: Secondary | ICD-10-CM | POA: Insufficient documentation

## 2024-09-03 DIAGNOSIS — E782 Mixed hyperlipidemia: Secondary | ICD-10-CM | POA: Diagnosis not present

## 2024-09-03 DIAGNOSIS — I340 Nonrheumatic mitral (valve) insufficiency: Secondary | ICD-10-CM | POA: Diagnosis not present

## 2024-09-03 DIAGNOSIS — I10 Essential (primary) hypertension: Secondary | ICD-10-CM | POA: Diagnosis not present

## 2024-09-03 DIAGNOSIS — G4733 Obstructive sleep apnea (adult) (pediatric): Secondary | ICD-10-CM

## 2024-09-03 DIAGNOSIS — H5213 Myopia, bilateral: Secondary | ICD-10-CM | POA: Diagnosis not present

## 2024-09-03 HISTORY — DX: Nonrheumatic aortic (valve) stenosis: I35.0

## 2024-09-03 NOTE — Patient Instructions (Signed)

## 2024-09-03 NOTE — Progress Notes (Signed)
 Cardiology Office Note:    Date:  09/03/2024   ID:  [name], DOB [birth year], MRN [id number]  PCP:  Clemmie Nest, MD  Cardiologist:  Jennifer JONELLE Crape, MD   Referring MD: Clemmie Nest, MD    ASSESSMENT:    1. Essential hypertension   2. Mitral valve insufficiency, unspecified etiology   3. Mixed hyperlipidemia   4. Moderate aortic stenosis   5. OSA (obstructive sleep apnea)    PLAN:    In order of problems listed above:  Primary prevention stressed with the patient.  Importance of compliance with diet medication stressed and patient verbalized standing. Moderate mitral stenosis: Stable at this time and asymptomatic.  We will continue to monitor.  No symptoms at this time.  I discussed symptom profile and educated her and questions were answered to her satisfaction. Mild-moderate mitral regurgitation: Stable and we will continue to monitor. Mixed dyslipidemia: On lipid-lowering medications followed by primary care. Essential hypertension: Blood pressure is stable.  Salt intake issues and lifestyle modification urged and she promises to do better. Patient will be seen in follow-up appointment in 6 months or earlier if the patient has any concerns.    Medication Adjustments/Labs and Tests Ordered: Current medicines are reviewed at length with the patient today.  Concerns regarding medicines are outlined above.  No orders of the defined types were placed in this encounter.  No orders of the defined types were placed in this encounter.    No chief complaint on file.    History of Present Illness:    [name] is a 78 y.o. female.  Patient has past medical history of coronary artery stenosis, mitral regurgitation mild to moderate, essential hypertension, mixed dyslipidemia and sleep apnea.  She mentions to me that she has sacroiliitis and has been in significant pain which has taken her blood pressure.  She got a steroid shot yesterday and it has helped  her.  No chest pain orthopnea or PND.  At the time of my evaluation, the patient is alert awake oriented and in no distress.  Past Medical History:  Diagnosis Date   ABDOMINAL PAIN RIGHT LOWER QUADRANT 03/03/2010   Qualifier: Diagnosis of  By: Obie MD, Princella HERO    Acquired trigger finger 04/20/2016   Age-related osteoporosis without current pathological fracture 01/23/2017   Allergy    Anxiety    Arthritis    Asthma    Bilateral carotid artery stenosis 01/16/2020   Bilateral primary osteoarthritis of knee 10/16/2018   Cataract    bilateral and removed   Change in bowel function 11/07/2022   Chest pain 11/01/2013   Closed fracture of distal end of radius 06/05/2016   Closed fracture of nasal bones 04/26/2023   Last Assessment & Plan: Formatting of this note might be different from the original. Nasal trauma. See history of present illness.  Denies nasal obstruction or concern over the appearance of the nose.  CT is reviewed and shows minimally displaced right nasal bone fracture. EXAM shows nasal dorsum in the midline.  Still little edema of the nasal dorsum.  Intranasal exam shows midline nasal septum.   DDD (degenerative disc disease), lumbar 01/23/2017   DECREASED HEARING 05/02/2008   Qualifier: Diagnosis of   By: Marcelo CMA (AAMA), Dottie      Replacing diagnoses that were inactivated after the 02/27/23 regulatory import   Depression    Diabetes mellitus type II, non insulin  dependent (HCC) 07/05/2019   DM 03/03/2010   Qualifier: Diagnosis  of  By: Claudene CMA, Burnard     DM (diabetes mellitus) (HCC)    DOE (dyspnea on exertion) 05/06/2019   Dyslipidemia 01/23/2017   Dyspnea    Esophageal reflux    ESOPHAGITIS 05/02/2008   Qualifier: Diagnosis of   By: Marcelo CMA (AAMA), Dottie      Replacing diagnoses that were inactivated after the 02/27/23 regulatory import   Essential hypertension 05/02/2008   Qualifier: Diagnosis of  By: Nelson-Smith CMA (AAMA), Dottie     Full  incontinence of feces 03/03/2010   Qualifier: Diagnosis of  By: Obie MD, Princella HERO    H/O adenomatous polyp of colon 2011   H/O allergic rhinitis 07/29/2015   Hearing loss, mixed, bilateral 12/17/2018   Heart murmur    no cardiologist, family Dr. ardelle   HIATAL HERNIA 05/02/2008   Qualifier: Diagnosis of   By: Marcelo CMA (AAMA), Dottie      Replacing diagnoses that were inactivated after the 02/27/23 regulatory import   History of asthma 01/23/2017   History of depression 01/23/2017   History of diabetes mellitus 01/23/2017   History of gastroesophageal reflux (GERD) 01/17/2017   History of kidney stones    History of transient ischemic attack (TIA) 01/16/2020   Hyperlipidemia    Iron deficiency anemia 12/28/2023   Irritable bowel syndrome    Kidney stones    LPRD (laryngopharyngeal reflux disease) 07/29/2015   Mallory-Weiss tear 07/03/2022   Mild aortic stenosis 07/05/2019   Mitral regurgitation 07/05/2019   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 Here for medical clearance for the right ear. Noted to have recent worsening of her right ear on audiogram at an outside facility.  Denies any pain.  In general, she is functioning well with her current hearing aid though she is looking at getting a new one.  Left ear with profound loss from prior surgery.  Audio   Murmur 11/01/2013   Normocytic anemia 01/16/2020   Obstructive lung disease (HCC) 07/29/2015   OSA (obstructive sleep apnea) 09/28/2018   Pedal edema 05/06/2019   Primary osteoarthritis of both feet 01/17/2017   Primary osteoarthritis of both knees 01/17/2017   Primary osteoarthritis, left shoulder 12/11/2017   RECTAL INCONTINENCE 05/02/2008   Qualifier: Diagnosis of   By: Marcelo CMA (AAMA), Dottie      Replacing diagnoses that were inactivated after the 02/27/23  regulatory import   Retained orthopedic hardware 11/14/2016   Rheumatoid factor positive 12/13/2016   Sarcoidosis    Severe persistent asthma (HCC) 11/16/2007   Annotation: chronic Qualifier: Diagnosis of  By: Darlean MD, Ozell NOVAK    Shoulder arthritis 02/22/2018   Sleep apnea    no cpap now   Status post dilation of esophageal narrowing    Stiffness of left wrist joint 06/06/2016   Stroke (HCC)    Vitamin D  deficiency 01/17/2017    Past Surgical History:  Procedure Laterality Date   BIOPSY  12/28/2023   Procedure: BIOPSY;  Surgeon: Federico Rosario BROCKS, MD;  Location: [facility];  Service: Gastroenterology;;   BLADDER SURGERY     CATARACT EXTRACTION Bilateral    COLONOSCOPY WITH PROPOFOL  N/A 12/28/2023   Procedure: COLONOSCOPY WITH PROPOFOL ;  Surgeon: Federico Rosario BROCKS, MD;  Location: [facility];  Service: Gastroenterology;  Laterality: N/A;   ESOPHAGOGASTRODUODENOSCOPY (EGD) WITH PROPOFOL  N/A 12/28/2023   Procedure: ESOPHAGOGASTRODUODENOSCOPY (EGD) WITH  PROPOFOL ;  Surgeon: Federico Rosario BROCKS, MD;  Location: [facility];  Service: Gastroenterology;  Laterality: N/A;   HARDWARE REMOVAL Left 01/12/2017   Procedure: HARDWARE REMOVAL left wrist;  Surgeon: Franky Curia, MD;  Location: [facility];  Service: Orthopedics;  Laterality: Left;   HEMORRHOID SURGERY     NASAL SEPTUM SURGERY     PARTIAL HYSTERECTOMY     POLYPECTOMY  12/28/2023   Procedure: POLYPECTOMY;  Surgeon: Federico Rosario BROCKS, MD;  Location: [facility];  Service: Gastroenterology;;   REVERSE SHOULDER ARTHROPLASTY Left 02/22/2018   REVERSE SHOULDER ARTHROPLASTY Left 02/22/2018   Procedure: LEFT REVERSE SHOULDER ARTHROPLASTY;  Surgeon: Addie Cordella Hamilton, MD;  Location: [facility] OR;  Service: Orthopedics;  Laterality: Left;   TONSILLECTOMY AND ADENOIDECTOMY     TRANSCAROTID ARTERY REVASCULARIZATION  Left 01/17/2020   Procedure: TRANSCAROTID ARTERY REVASCULARIZATION;  Surgeon: Serene Gaile ORN, MD;  Location: [facility] OR;  Service:  Vascular;  Laterality: Left;   TRIGGER FINGER RELEASE Left 05/12/2016   Procedure: RELEASE TRIGGER FINGER/A-1 PULLEY INFECTION LEFT RING TENDON SHEATH INJECT RIGHT INDEX FINGER;  Surgeon: Franky Curia, MD;  Location: [facility];  Service: Orthopedics;  Laterality: Left;   TUBAL LIGATION      Current Medications: Current Meds  Medication Sig   albuterol  (VENTOLIN  HFA) 108 (90 Base) MCG/ACT inhaler Inhale 2 puffs into the lungs every 6 (six) hours as needed for wheezing or shortness of breath.   alendronate (FOSAMAX) 70 MG tablet Take 70 mg by mouth once a week.   aspirin  81 MG EC tablet Take 1 tablet by mouth daily.   atorvastatin  (LIPITOR) 40 MG tablet Take 40 mg by mouth at bedtime.   citalopram  (CELEXA ) 20 MG tablet Take 20 mg by mouth daily.   famotidine  (PEPCID ) 40 MG tablet Take 1 tablet (40 mg total) by mouth at bedtime.   fluticasone -salmeterol (ADVAIR) 250-50 MCG/ACT AEPB Inhale 1 puff into the lungs every 12 (twelve) hours.   ipratropium-albuterol  (DUONEB) 0.5-2.5 (3) MG/3ML SOLN Take 3 mLs by nebulization every 6 (six) hours as needed (shortness of breath/wheezing).   levocetirizine (XYZAL) 5 MG tablet Take 5 mg by mouth at bedtime.   metoprolol  succinate (TOPROL -XL) 25 MG 24 hr tablet Take 1 tablet (25 mg total) by mouth at bedtime.   montelukast (SINGULAIR) 10 MG tablet Take 10 mg by mouth daily.   Multiple Vitamins-Minerals (PRESERVISION AREDS PO) Take by mouth in the morning and at bedtime.   omeprazole  (PRILOSEC) 40 MG capsule Take 40 mg by mouth 2 (two) times daily.   Semaglutide ,0.25 or 0.5MG /DOS, (OZEMPIC , 0.25 OR 0.5 MG/DOSE,) 2 MG/1.5ML SOPN Inject 0.25 mg into the skin once a week. (Patient taking differently: Inject 0.25 mg into the skin every 14 (fourteen) days.)   Telmisartan -amLODIPine  80-10 MG TABS Take 1 tablet by mouth daily.   umeclidinium bromide  (INCRUSE ELLIPTA ) 62.5 MCG/ACT AEPB Inhale 1 puff into the lungs daily.   Current Facility-Administered  Medications for the 09/03/24 encounter (Office Visit) with Aviah Sorci R, MD  Medication   tezepelumab -ekko (TEZSPIRE ) 210 MG/1. syringe 210 mg     Allergies:   Codeine and Sulfa antibiotics   Social History   Socioeconomic History   Marital status: Married    Spouse name: Not on file   Number of children: 2   Years of education: Not on file   Highest education level: Not on file  Occupational History   Occupation: retired  Tobacco Use   Smoking status: Never   Smokeless  tobacco: Never  Vaping Use   Vaping status: Never Used  Substance and Sexual Activity   Alcohol use: No   Drug use: No   Sexual activity: Not on file  Other Topics Concern   Not on file  Social History Narrative   Daily caffeine use.    Social Drivers of Corporate investment banker Strain: Not on file  Food Insecurity: Low Risk  (11/21/2022)   Received from [facility]   Hunger Vital Sign    Within the past 12 months, you worried that your food would run out before you got money to buy more: Never true    Within the past 12 months, the food you bought just didn't last and you didn't have money to get more: Not on file  Transportation Needs: No Transportation Needs (11/21/2022)   Received from [facility]    In the past 12 months, has lack of reliable transportation kept you from medical appointments, meetings, work or from getting things needed for daily living? : No  Physical Activity: Not on file  Stress: Not on file  Social Connections: Not on file     Family History: The patient's family history includes Diabetes in her maternal grandmother and mother; Esophageal cancer in her father. There is no history of Colon cancer, Rectal cancer, or Stomach cancer.  ROS:   Please see the history of present illness.    All other systems reviewed and are negative.  EKGs/Labs/Other Studies Reviewed:    The following studies were reviewed today: .SABRA   I discussed my findings  with the patient at length   Recent Labs: 10/16/2023: TSH 0.73 08/02/2024: ALT 7; BUN 21; Creatinine, Ser 0.62; Hemoglobin 11.2; Platelets 227; Potassium 4.1; Sodium 136  Recent Lipid Panel    Component Value Date/Time   CHOL 139 07/21/2020 0856   TRIG 92 07/21/2020 0856   HDL 59 07/21/2020 0856   CHOLHDL 2.4 07/21/2020 0856   LDLCALC 63 07/21/2020 0856   LDLDIRECT 109 (H) 02/16/2024 1056    Physical Exam:    VS:  BP (!) 160/78   Pulse (!) 56   Ht 5' (1.524 m)   Wt 133 lb 12.8 oz (60.7 kg)   SpO2 97%   BMI 26.13 kg/m     Wt Readings from Last 3 Encounters:  09/03/24 133 lb 12.8 oz (60.7 kg)  08/14/24 139 lb (63 kg)  08/02/24 138 lb (62.6 kg)     GEN: Patient is in no acute distress HEENT: Normal NECK: No JVD; No carotid bruits LYMPHATICS: No lymphadenopathy CARDIAC: Hear sounds regular, 2/6 systolic murmur at the apex. RESPIRATORY:  Clear to auscultation without rales, wheezing or rhonchi  ABDOMEN: Soft, non-tender, non-distended MUSCULOSKELETAL:  No edema; No deformity  SKIN: Warm and dry NEUROLOGIC:  Alert and oriented x 3 PSYCHIATRIC:  Normal affect   Signed, Jennifer JONELLE Crape, MD  09/03/2024 3:04 PM    [facility]

## 2024-09-04 ENCOUNTER — Other Ambulatory Visit: Payer: Self-pay | Admitting: Cardiology

## 2024-09-04 DIAGNOSIS — N3281 Overactive bladder: Secondary | ICD-10-CM | POA: Diagnosis not present

## 2024-09-06 ENCOUNTER — Ambulatory Visit: Admitting: Orthopedic Surgery

## 2024-09-06 DIAGNOSIS — M461 Sacroiliitis, not elsewhere classified: Secondary | ICD-10-CM | POA: Diagnosis not present

## 2024-09-11 ENCOUNTER — Ambulatory Visit: Admitting: Orthopedic Surgery

## 2024-09-11 DIAGNOSIS — M461 Sacroiliitis, not elsewhere classified: Secondary | ICD-10-CM | POA: Diagnosis not present

## 2024-09-13 DIAGNOSIS — M461 Sacroiliitis, not elsewhere classified: Secondary | ICD-10-CM | POA: Diagnosis not present

## 2024-09-16 DIAGNOSIS — M461 Sacroiliitis, not elsewhere classified: Secondary | ICD-10-CM | POA: Diagnosis not present

## 2024-09-17 ENCOUNTER — Ambulatory Visit: Admitting: *Deleted

## 2024-09-17 DIAGNOSIS — J4550 Severe persistent asthma, uncomplicated: Secondary | ICD-10-CM

## 2024-09-20 DIAGNOSIS — M461 Sacroiliitis, not elsewhere classified: Secondary | ICD-10-CM | POA: Diagnosis not present

## 2024-09-23 ENCOUNTER — Encounter: Payer: Self-pay | Admitting: Pulmonary Disease

## 2024-09-23 ENCOUNTER — Ambulatory Visit: Admitting: Pulmonary Disease

## 2024-09-23 VITALS — BP 166/70 | HR 59 | Ht 60.0 in | Wt 126.4 lb

## 2024-09-23 DIAGNOSIS — M4726 Other spondylosis with radiculopathy, lumbar region: Secondary | ICD-10-CM | POA: Diagnosis not present

## 2024-09-23 DIAGNOSIS — G4730 Sleep apnea, unspecified: Secondary | ICD-10-CM

## 2024-09-23 DIAGNOSIS — M461 Sacroiliitis, not elsewhere classified: Secondary | ICD-10-CM | POA: Diagnosis not present

## 2024-09-23 DIAGNOSIS — J449 Chronic obstructive pulmonary disease, unspecified: Secondary | ICD-10-CM

## 2024-09-23 DIAGNOSIS — Z6826 Body mass index (BMI) 26.0-26.9, adult: Secondary | ICD-10-CM | POA: Diagnosis not present

## 2024-09-23 DIAGNOSIS — J431 Panlobular emphysema: Secondary | ICD-10-CM

## 2024-09-23 NOTE — Patient Instructions (Addendum)
 Continue using CPAP  Download from the machine shows it is working well  We will get a breathing study on you at the next visit in 3 months  Call us  with significant concerns  Continue graded activities as tolerated

## 2024-09-23 NOTE — Progress Notes (Signed)
 [name]    [id number]    Apr 28, 1946  Primary Care Physician:[name], [name]  Referring Physician: Clemmie [name] [address] [place],  [place] [address]  Chief complaint:    Patient being followed for history of obstructive sleep apnea, chronic obstructive pulmonary disease   HPI:  sleep history of severe obstructive sleep apnea Has been trying to use her CPAP Uses about 4 hours 38 minutes -Does not like it much but trying to get used to.  Breathing is stable  Machine is about 78 years old, may need a new one soon  History of COPD History of asthma as a child  On Advair and Incruse  History of sarcoidosis, bronchiectasis  Follows up with allergist, [name] - On Tezspire    Outpatient Encounter Medications as of 09/23/2024  Medication Sig   albuterol  (VENTOLIN  HFA) 108 (90 Base) MCG/ACT inhaler Inhale 2 puffs into the lungs every 6 (six) hours as needed for wheezing or shortness of breath.   alendronate (FOSAMAX) 70 MG tablet Take 70 mg by mouth once a week.   aspirin  81 MG EC tablet Take 1 tablet by mouth daily.   atorvastatin  (LIPITOR) 40 MG tablet Take 40 mg by mouth at bedtime.   citalopram  (CELEXA ) 20 MG tablet Take 20 mg by mouth daily.   famotidine  (PEPCID ) 40 MG tablet Take 1 tablet (40 mg total) by mouth at bedtime.   fluticasone -salmeterol (ADVAIR) 250-50 MCG/ACT AEPB Inhale 1 puff into the lungs every 12 (twelve) hours.   ipratropium-albuterol  (DUONEB) 0.5-2.5 (3) MG/3ML SOLN Take 3 mLs by nebulization every 6 (six) hours as needed (shortness of breath/wheezing).   levocetirizine (XYZAL) 5 MG tablet Take 5 mg by mouth at bedtime.   metoprolol  succinate (TOPROL -XL) 25 MG 24 hr tablet TAKE 1 TABLET BY MOUTH AT BEDTIME   montelukast (SINGULAIR) 10 MG tablet Take 10 mg by mouth daily.   Multiple Vitamins-Minerals (PRESERVISION AREDS PO) Take by mouth in the morning and at bedtime.   omeprazole  (PRILOSEC) 40 MG capsule Take  40 mg by mouth 2 (two) times daily.   pregabalin (LYRICA) 25 MG capsule Take 25 mg by mouth at bedtime as needed.   Semaglutide ,0.25 or 0.5MG /DOS, (OZEMPIC , 0.25 OR 0.5 MG/DOSE,) 2 MG/1.5ML SOPN Inject 0.25 mg into the skin once a week. (Patient taking differently: Inject 0.25 mg into the skin every 14 (fourteen) days.)   Telmisartan -amLODIPine  80-10 MG TABS Take 1 tablet by mouth daily.   umeclidinium bromide  (INCRUSE ELLIPTA ) 62.5 MCG/ACT AEPB Inhale 1 puff into the lungs daily.   Facility-Administered Encounter Medications as of 09/23/2024  Medication   tezepelumab -ekko (TEZSPIRE ) 210 MG/1. syringe 210 mg    Allergies as of 09/23/2024 - Review Complete 09/23/2024  Allergen Reaction Noted   Codeine Nausea And Vomiting 05/20/2015   Sulfa antibiotics Other (See Comments) 05/20/2015    Past Medical History:  Diagnosis Date   ABDOMINAL PAIN RIGHT LOWER QUADRANT 03/03/2010   Qualifier: Diagnosis of  By: Obie [name], Princella HERO    Acquired trigger finger 04/20/2016   Age-related osteoporosis without current pathological fracture 01/23/2017   Allergy    Anxiety    Arthritis    Asthma    Bilateral carotid artery stenosis 01/16/2020   Bilateral primary osteoarthritis of knee 10/16/2018   Cataract    bilateral and removed   Change in bowel function 11/07/2022   Chest pain 11/01/2013   Closed fracture of distal end of radius 06/05/2016  Closed fracture of nasal bones 04/26/2023   Last Assessment & Plan: Formatting of this note might be different from the original. Nasal trauma. See history of present illness.  Denies nasal obstruction or concern over the appearance of the nose.  CT is reviewed and shows minimally displaced right nasal bone fracture. EXAM shows nasal dorsum in the midline.  Still little edema of the nasal dorsum.  Intranasal exam shows midline nasal septum.   DDD (degenerative disc disease), lumbar 01/23/2017   DECREASED HEARING 05/02/2008   Qualifier: Diagnosis of   By:  Marcelo CMA (AAMA), Dottie      Replacing diagnoses that were inactivated after the 02/27/23 regulatory import   Depression    Diabetes mellitus type II, non insulin  dependent (HCC) 07/05/2019   DM 03/03/2010   Qualifier: Diagnosis of  By: Claudene CMA, Burnard     DM (diabetes mellitus) (HCC)    DOE (dyspnea on exertion) 05/06/2019   Dyslipidemia 01/23/2017   Dyspnea    Esophageal reflux    ESOPHAGITIS 05/02/2008   Qualifier: Diagnosis of   By: Marcelo CMA (AAMA), Dottie      Replacing diagnoses that were inactivated after the 02/27/23 regulatory import   Essential hypertension 05/02/2008   Qualifier: Diagnosis of  By: Nelson-Smith CMA (AAMA), Dottie     Full incontinence of feces 03/03/2010   Qualifier: Diagnosis of  By: Obie [name], Princella HERO    H/O adenomatous polyp of colon 2011   H/O allergic rhinitis 07/29/2015   Hearing loss, mixed, bilateral 12/17/2018   Heart murmur    no cardiologist, family Dr. ardelle   HIATAL HERNIA 05/02/2008   Qualifier: Diagnosis of   By: Marcelo CMA (AAMA), Dottie      Replacing diagnoses that were inactivated after the 02/27/23 regulatory import   History of asthma 01/23/2017   History of depression 01/23/2017   History of diabetes mellitus 01/23/2017   History of gastroesophageal reflux (GERD) 01/17/2017   History of kidney stones    History of transient ischemic attack (TIA) 01/16/2020   Hyperlipidemia    Iron deficiency anemia 12/28/2023   Irritable bowel syndrome    Kidney stones    LPRD (laryngopharyngeal reflux disease) 07/29/2015   Mallory-Weiss tear 07/03/2022   Mild aortic stenosis 07/05/2019   Mitral regurgitation 07/05/2019   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 Here for medical clearance for the right ear. Noted to have recent worsening of her right ear on audiogram at an  outside facility.  Denies any pain.  In general, she is functioning well with her current hearing aid though she is looking at getting a new one.  Left ear with profound loss from prior surgery.  Audio   Murmur 11/01/2013   Normocytic anemia 01/16/2020   Obstructive lung disease (HCC) 07/29/2015   OSA (obstructive sleep apnea) 09/28/2018   Pedal edema 05/06/2019   Primary osteoarthritis of both feet 01/17/2017   Primary osteoarthritis of both knees 01/17/2017   Primary osteoarthritis, left shoulder 12/11/2017   RECTAL INCONTINENCE 05/02/2008   Qualifier: Diagnosis of   By: Nelson-Smith CMA (AAMA), Dottie      Replacing diagnoses that were inactivated after the 02/27/23 regulatory import   Retained orthopedic hardware 11/14/2016   Rheumatoid factor positive 12/13/2016   Sarcoidosis    Severe persistent asthma (HCC) 11/16/2007   Annotation: chronic Qualifier: Diagnosis  of  By: Darlean [name], Ozell NOVAK    Shoulder arthritis 02/22/2018   Sleep apnea    no cpap now   Status post dilation of esophageal narrowing    Stiffness of left wrist joint 06/06/2016   Stroke (HCC)    Vitamin D  deficiency 01/17/2017    Past Surgical History:  Procedure Laterality Date   BIOPSY  12/28/2023   Procedure: BIOPSY;  Surgeon: Federico Rosario BROCKS, [name];  Location: [facility];  Service: Gastroenterology;;   BLADDER SURGERY     CATARACT EXTRACTION Bilateral    COLONOSCOPY WITH PROPOFOL  N/A 12/28/2023   Procedure: COLONOSCOPY WITH PROPOFOL ;  Surgeon: Federico Rosario BROCKS, [name];  Location: [facility];  Service: Gastroenterology;  Laterality: N/A;   ESOPHAGOGASTRODUODENOSCOPY (EGD) WITH PROPOFOL  N/A 12/28/2023   Procedure: ESOPHAGOGASTRODUODENOSCOPY (EGD) WITH PROPOFOL ;  Surgeon: Federico Rosario BROCKS, [name];  Location: [facility];  Service: Gastroenterology;  Laterality: N/A;   HARDWARE REMOVAL Left 01/12/2017   Procedure: HARDWARE REMOVAL left wrist;  Surgeon: Franky Curia, [name];  Location: [facility];  Service: Orthopedics;   Laterality: Left;   HEMORRHOID SURGERY     NASAL SEPTUM SURGERY     PARTIAL HYSTERECTOMY     POLYPECTOMY  12/28/2023   Procedure: POLYPECTOMY;  Surgeon: Federico Rosario BROCKS, [name];  Location: [facility];  Service: Gastroenterology;;   REVERSE SHOULDER ARTHROPLASTY Left 02/22/2018   REVERSE SHOULDER ARTHROPLASTY Left 02/22/2018   Procedure: LEFT REVERSE SHOULDER ARTHROPLASTY;  Surgeon: Addie Cordella Hamilton, [name];  Location: [facility] OR;  Service: Orthopedics;  Laterality: Left;   TONSILLECTOMY AND ADENOIDECTOMY     TRANSCAROTID ARTERY REVASCULARIZATION  Left 01/17/2020   Procedure: TRANSCAROTID ARTERY REVASCULARIZATION;  Surgeon: Serene Gaile ORN, [name];  Location: [facility] OR;  Service: Vascular;  Laterality: Left;   TRIGGER FINGER RELEASE Left 05/12/2016   Procedure: RELEASE TRIGGER FINGER/A-1 PULLEY INFECTION LEFT RING TENDON SHEATH INJECT RIGHT INDEX FINGER;  Surgeon: Franky Curia, [name];  Location: [facility];  Service: Orthopedics;  Laterality: Left;   TUBAL LIGATION      Family History  Problem Relation Age of Onset   Esophageal cancer Father    Diabetes Mother    Diabetes Maternal Grandmother    Colon cancer Neg Hx    Rectal cancer Neg Hx    Stomach cancer Neg Hx     Social History   Socioeconomic History   Marital status: Married    Spouse name: Not on file   Number of children: 2   Years of education: Not on file   Highest education level: Not on file  Occupational History   Occupation: retired  Tobacco Use   Smoking status: Never   Smokeless tobacco: Never  Vaping Use   Vaping status: Never Used  Substance and Sexual Activity   Alcohol use: No   Drug use: No   Sexual activity: Not on file  Other Topics Concern   Not on file  Social History Narrative   Daily caffeine use.    Social Drivers of Corporate Investment Banker Strain: Not on file  Food Insecurity: Low Risk  (11/21/2022)   Received from [facility]   Hunger Vital Sign    Within the past 12 months, you  worried that your food would run out before you got money to buy more: Never true    Within the past 12 months, the food you bought just didn't last and you didn't have money to get more: Not on file  Transportation Needs: No Transportation Needs (  11/21/2022)   Received from [facility]   Transportation    In the past 12 months, has lack of reliable transportation kept you from medical appointments, meetings, work or from getting things needed for daily living? : No  Physical Activity: Not on file  Stress: Not on file  Social Connections: Not on file  Intimate Partner Violence: Low Risk  (11/21/2022)   Received from [facility] visits prior to 01/28/2023.   Safety    How often does anyone, including family and friends, physically hurt you?: Never    How often does anyone, including family and friends, insult or talk down to you?: Never    How often does anyone, including family and friends, threaten you with harm?: Never    How often does anyone, including family and friends, scream or curse at you?: Never    Review of Systems  Respiratory:  Positive for apnea and shortness of breath.   Psychiatric/Behavioral:  Positive for sleep disturbance.     Vitals:   09/23/24 1308  BP: (!) 169/63  Pulse: (!) 59  SpO2: 99%     Physical Exam Constitutional:      Appearance: Normal appearance.  HENT:     Head: Normocephalic.     Mouth/Throat:     Mouth: Mucous membranes are moist.  Eyes:     General: No scleral icterus. Cardiovascular:     Rate and Rhythm: Normal rate and regular rhythm.     Heart sounds: No murmur heard.    No friction rub.  Pulmonary:     Effort: No respiratory distress.     Breath sounds: No stridor. No wheezing or rhonchi.  Musculoskeletal:     Cervical back: No rigidity or tenderness.  Neurological:     Mental Status: She is alert.  Psychiatric:        Mood and Affect: Mood normal.      Data Reviewed: Sleep study from 2024  reviewed showing AHI of 34, O2 nadir of 70%  CPAP compliance reviewed showing good compliance at 93% average use of 4 hours 47 minutes CPAP of 11 Residual AHI of 3.2  PFT 05/01/2024-spirometry with moderate obstructive disease  Previous office visit records reviewed from Almarie Ferrari, Dr. Carolynne Allan  Assessment/Plan: Chronic obstructive pulmonary disease Moderate obstruction on last PFT - Will repeat PFT at next visit in 3 months  History of sleep apnea - Has gone back to using CPAP on a regular basis - AHI appears better - She feels okay with using it  Encourage graded activities as tolerated  Encourage compliance with inhalers including Incruse and Advair  Follow-up in 3 months  Encouraged to call with significant concerns  I spent 32 minutes Pre-charting, reviewing records, interviewing/examining patient, and managing orders.   Jennet Epley [name] [facility] 09/23/2024, 1:35 PM  CC: [name] Nest, [name]

## 2024-09-24 DIAGNOSIS — Z6824 Body mass index (BMI) 24.0-24.9, adult: Secondary | ICD-10-CM | POA: Diagnosis not present

## 2024-09-24 DIAGNOSIS — R300 Dysuria: Secondary | ICD-10-CM | POA: Diagnosis not present

## 2024-09-24 DIAGNOSIS — K921 Melena: Secondary | ICD-10-CM | POA: Diagnosis not present

## 2024-09-24 DIAGNOSIS — N12 Tubulo-interstitial nephritis, not specified as acute or chronic: Secondary | ICD-10-CM | POA: Diagnosis not present

## 2024-09-26 DIAGNOSIS — E1169 Type 2 diabetes mellitus with other specified complication: Secondary | ICD-10-CM | POA: Diagnosis not present

## 2024-09-26 DIAGNOSIS — N12 Tubulo-interstitial nephritis, not specified as acute or chronic: Secondary | ICD-10-CM | POA: Diagnosis not present

## 2024-09-26 DIAGNOSIS — R112 Nausea with vomiting, unspecified: Secondary | ICD-10-CM | POA: Diagnosis not present

## 2024-09-26 DIAGNOSIS — E785 Hyperlipidemia, unspecified: Secondary | ICD-10-CM | POA: Diagnosis not present

## 2024-09-27 DIAGNOSIS — M461 Sacroiliitis, not elsewhere classified: Secondary | ICD-10-CM | POA: Diagnosis not present

## 2024-10-01 ENCOUNTER — Encounter: Payer: Self-pay | Admitting: Podiatry

## 2024-10-01 ENCOUNTER — Ambulatory Visit: Admitting: Podiatry

## 2024-10-01 DIAGNOSIS — M79675 Pain in left toe(s): Secondary | ICD-10-CM

## 2024-10-01 DIAGNOSIS — E119 Type 2 diabetes mellitus without complications: Secondary | ICD-10-CM

## 2024-10-01 DIAGNOSIS — M79674 Pain in right toe(s): Secondary | ICD-10-CM | POA: Diagnosis not present

## 2024-10-01 DIAGNOSIS — B351 Tinea unguium: Secondary | ICD-10-CM

## 2024-10-01 NOTE — Progress Notes (Unsigned)
  Subjective:  Patient ID: [name], female    DOB: 1946-09-29,  MRN: [id number]  Chief Complaint  Patient presents with   [facility]    [facility] no callous A1c 6.5 in Oct. ASA    78 y.o. female presents with the above complaint. History confirmed with patient. Patient presenting with pain related to dystrophic thickened elongated nails. Patient is unable to trim own nails related to nail dystrophy and/or mobility issues. Patient does have a history of T2DM.  Last A1c 6.5.  She is now seeing specialist for her back pain.  Objective:  Physical Exam: warm, good capillary refill nail exam onychomycosis of the toenails, onycholysis, and dystrophic nails DP pulses palpable, PT pulses palpable, and protective sensation intact, pain with leg raise Left Foot:  Pain with palpation of nails due to elongation and dystrophic growth.  Right Foot: Pain with palpation of nails due to elongation and dystrophic growth.  Hammertoe contractures and bunion deformities present    Assessment:   1. Pain due to onychomycosis of toenails of both feet   2. Diabetes mellitus type II, non insulin  dependent (HCC)      Plan:  Patient was evaluated and treated and all questions answered.  #Onychomycosis with pain  -Nails palliatively debrided as below. -Educated on self-care  Procedure: Nail Debridement Rationale: Pain Type of Debridement: manual, sharp debridement. Instrumentation: Nail nipper, rotary burr. Number of Nails: 10   Patient educated on diabetes. Discussed proper diabetic foot care and discussed risks and complications of disease. Educated patient in depth on reasons to return to the office immediately should he/she discover anything concerning or new on the feet. All questions answered. Discussed proper shoes as well.  - Toe spacers for bunions and hammertoe deformities dispensed.    Return in about 3 months (around 01/01/2025) for Diabetic Foot Care.         Ethan Saddler, DPM [facility]

## 2024-10-01 NOTE — Patient Instructions (Signed)
 More silicone  and felt pads can be purchased from:  [email or web address]  Look for felt toe spacers. You can also find them on amazon or from [email or web address]

## 2024-10-02 ENCOUNTER — Encounter: Payer: Self-pay | Admitting: Orthopedic Surgery

## 2024-10-02 ENCOUNTER — Ambulatory Visit: Admitting: Orthopedic Surgery

## 2024-10-02 DIAGNOSIS — M461 Sacroiliitis, not elsewhere classified: Secondary | ICD-10-CM | POA: Diagnosis not present

## 2024-10-02 DIAGNOSIS — M1711 Unilateral primary osteoarthritis, right knee: Secondary | ICD-10-CM

## 2024-10-02 NOTE — Progress Notes (Signed)
 Office Visit Note   Patient: [name]           Date of Birth: March 01, 1946           MRN: [id number] Visit Date: 10/02/2024 Requested by: Clemmie Nest, MD [address] [place],  [place] [address] PCP: Clemmie Nest, MD  Subjective: Chief Complaint  Patient presents with   Other     Bilateral knee pain-requesting bilateral knee cortisone injections    HPI: [name] is a 78 y.o. female who presents to the office reporting bilateral knee pain.  She had injections done in both knees back in April.  That has helped her significantly.  Since she was last seen she has had some trouble with her back and legs with sciatica.  She has been to the emergency department twice.  Seeing a spine physician.  They have recommended epidural steroid injections.  She is also doing physical therapy.  She has physical therapy today..                ROS: All systems reviewed are negative as they relate to the chief complaint within the history of present illness.  Patient denies fevers or chills.  Assessment & Plan: Visit Diagnoses: No diagnosis found.  Plan: Impression is bilateral knee pain and arthritis which has actually improved.  Hold off on injections today.  When her knees start to really aggravate her more then we will press on with injections.  Follow-Up Instructions: No follow-ups on file.   Orders:  No orders of the defined types were placed in this encounter.  No orders of the defined types were placed in this encounter.     Procedures: No procedures performed   Clinical Data: No additional findings.  Objective: Vital Signs: There were no vitals taken for this visit.  Physical Exam:  Constitutional: Patient appears well-developed HEENT:  Head: Normocephalic Eyes:EOM are normal Neck: Normal range of motion Cardiovascular: Normal rate Pulmonary/chest: Effort normal Neurologic: Patient is alert Skin: Skin is warm Psychiatric: Patient has normal  mood and affect  Ortho Exam: Ortho exam demonstrates bilateral knee flexion contractures of around 10 to 12 degrees.  Extensor mechanism intact.  Pedal pulses palpable.  No groin pain with internal/external Tatian of the leg and no nerve root tension signs today.  Specialty Comments:  CLINICAL DATA:  78 year old female with low back pain radiating down the leg.   EXAM: MRI LUMBAR SPINE WITHOUT CONTRAST   TECHNIQUE: Multiplanar, multisequence MR imaging of the lumbar spine was performed. No intravenous contrast was administered.   COMPARISON:  Lumbar radiographs 02/08/2023.  Lumbar MRI 04/09/2020.   FINDINGS: Segmentation: Normal on the comparison radiographs, the same numbering system used on the previous MRI.   Alignment: Chronic straightening of lumbar lordosis appears stable since 2021. Mild dextroconvex upper and levoconvex mid to lower lumbar scoliosis as noted on the comparison radiographs (series 14 today). There is subtle degenerative anterolisthesis of L3 on L4. See additional details of that level below.   Vertebrae: Normal background bone marrow signal and maintained vertebral height. Degenerative endplate marrow signal changes at L3-L4 including mild vertebral marrow edema eccentric to the right (series 107, image 10).   No other marrow edema or acute osseous abnormality. Intact visible sacrum and SI joints.   Conus medullaris and cauda equina: Conus extends to the L1-L2 level. No lower spinal cord or conus signal abnormality. Generally normal cauda equina nerve roots.   Paraspinal and other soft tissues: Negative.  Disc levels:   Visible lower thoracic levels are normal for age through T12-L1.   L1-L2: Mild disc bulging asymmetric to the left. Borderline to mild left facet hypertrophy. No stenosis.   L2-L3: Similar mild asymmetric disc bulging to the left. Mild left facet and mild ligament flavum hypertrophy. No stenosis.   L3-L4: Subtle  anterolisthesis. Disc space loss since 2021. Asymmetric disc bulging, bulky on the right side. Progressed and up to moderate right side facet and ligament flavum hypertrophy. No significant spinal stenosis. Borderline to mild right lateral recess stenosis (right L4 nerve level). And moderate progressed right L3 neural foraminal stenosis (series 105, image 7).   L4-L5: Subtle anterolisthesis also at this level. But only minor disc bulging. Mild facet and ligament flavum hypertrophy. No stenosis.   L5-S1:  Negative.   IMPRESSION: 1. Mild lumbar scoliosis. Progressed L3-L4 disc, endplate, and posterior element degeneration since 2021 with subtle anterolisthesis there. Increased and moderate right neural foraminal stenosis, mild right lateral recess stenosis. Query Right L3 and/or L4 radiculitis. 2. Otherwise mild for age lumbar spine degeneration. No spinal stenosis or other neural impingement.     Electronically Signed   By: VEAR Hurst M.D.   On: 10/30/2023 11:23  Imaging: No results found.   PMFS History: Patient Active Problem List   Diagnosis Date Noted   Moderate aortic stenosis 09/03/2024   Stroke (HCC)    Iron deficiency anemia 12/28/2023   Closed fracture of nasal bones 04/26/2023   Mixed conductive and sensorineural hearing loss of right ear with restricted hearing of left ear 11/08/2022   Change in bowel function 11/07/2022   Mallory-Weiss tear 07/03/2022   Allergy    Anxiety    Arthritis    Cataract    Depression    DM (diabetes mellitus) (HCC)    Dyspnea    Heart murmur    History of kidney stones    Hyperlipidemia    Kidney stones    Sleep apnea    Status post dilation of esophageal narrowing    Asthma 01/16/2020   History of transient ischemic attack (TIA) 01/16/2020   Bilateral carotid artery stenosis 01/16/2020   Normocytic anemia 01/16/2020   Diabetes mellitus type II, non insulin  dependent (HCC) 07/05/2019   Mitral regurgitation 07/05/2019    Pedal edema 05/06/2019   DOE (dyspnea on exertion) 05/06/2019   Hearing loss, mixed, bilateral 12/17/2018   Bilateral primary osteoarthritis of knee 10/16/2018   OSA (obstructive sleep apnea) 09/28/2018   Shoulder arthritis 02/22/2018   Primary osteoarthritis, left shoulder 12/11/2017   DDD (degenerative disc disease), lumbar 01/23/2017   History of diabetes mellitus 01/23/2017   History of depression 01/23/2017   History of asthma 01/23/2017   Dyslipidemia 01/23/2017   Age-related osteoporosis without current pathological fracture 01/23/2017   History of gastroesophageal reflux (GERD) 01/17/2017   Primary osteoarthritis of both knees 01/17/2017   Primary osteoarthritis of both feet 01/17/2017   Vitamin D  deficiency 01/17/2017   Rheumatoid factor positive 12/13/2016   Retained orthopedic hardware 11/14/2016   Stiffness of left wrist joint 06/06/2016   Closed fracture of distal end of radius 06/05/2016   Acquired trigger finger 04/20/2016   Obstructive lung disease (HCC) 07/29/2015   H/O allergic rhinitis 07/29/2015   LPRD (laryngopharyngeal reflux disease) 07/29/2015   Chest pain 11/01/2013   Murmur 11/01/2013   DM 03/03/2010   ABDOMINAL PAIN RIGHT LOWER QUADRANT 03/03/2010   FULL INCONTINENCE OF FECES 03/03/2010   H/O adenomatous polyp of colon 2011  DECREASED HEARING 05/02/2008   Essential hypertension 05/02/2008   ESOPHAGITIS 05/02/2008   HIATAL HERNIA 05/02/2008   RECTAL INCONTINENCE 05/02/2008   SARCOIDOSIS 11/16/2007   Severe persistent asthma (HCC) 11/16/2007   GERD 11/16/2007   IRRITABLE BOWEL SYNDROME 11/16/2007   Past Medical History:  Diagnosis Date   ABDOMINAL PAIN RIGHT LOWER QUADRANT 03/03/2010   Qualifier: Diagnosis of  By: Obie MD, Princella HERO    Acquired trigger finger 04/20/2016   Age-related osteoporosis without current pathological fracture 01/23/2017   Allergy    Anxiety    Arthritis    Asthma    Bilateral carotid artery stenosis 01/16/2020    Bilateral primary osteoarthritis of knee 10/16/2018   Cataract    bilateral and removed   Change in bowel function 11/07/2022   Chest pain 11/01/2013   Closed fracture of distal end of radius 06/05/2016   Closed fracture of nasal bones 04/26/2023   Last Assessment & Plan: Formatting of this note might be different from the original. Nasal trauma. See history of present illness.  Denies nasal obstruction or concern over the appearance of the nose.  CT is reviewed and shows minimally displaced right nasal bone fracture. EXAM shows nasal dorsum in the midline.  Still little edema of the nasal dorsum.  Intranasal exam shows midline nasal septum.   DDD (degenerative disc disease), lumbar 01/23/2017   DECREASED HEARING 05/02/2008   Qualifier: Diagnosis of   By: Marcelo CMA (AAMA), Dottie      Replacing diagnoses that were inactivated after the 02/27/23 regulatory import   Depression    Diabetes mellitus type II, non insulin  dependent (HCC) 07/05/2019   DM 03/03/2010   Qualifier: Diagnosis of  By: Claudene CMA, Burnard     DM (diabetes mellitus) (HCC)    DOE (dyspnea on exertion) 05/06/2019   Dyslipidemia 01/23/2017   Dyspnea    Esophageal reflux    ESOPHAGITIS 05/02/2008   Qualifier: Diagnosis of   By: Marcelo CMA (AAMA), Dottie      Replacing diagnoses that were inactivated after the 02/27/23 regulatory import   Essential hypertension 05/02/2008   Qualifier: Diagnosis of  By: Nelson-Smith CMA (AAMA), Dottie     Full incontinence of feces 03/03/2010   Qualifier: Diagnosis of  By: Obie MD, Princella HERO    H/O adenomatous polyp of colon 2011   H/O allergic rhinitis 07/29/2015   Hearing loss, mixed, bilateral 12/17/2018   Heart murmur    no cardiologist, family Dr. ardelle   HIATAL HERNIA 05/02/2008   Qualifier: Diagnosis of   By: Marcelo CMA (AAMA), Dottie      Replacing diagnoses that were inactivated after the 02/27/23 regulatory import   History of asthma 01/23/2017   History of  depression 01/23/2017   History of diabetes mellitus 01/23/2017   History of gastroesophageal reflux (GERD) 01/17/2017   History of kidney stones    History of transient ischemic attack (TIA) 01/16/2020   Hyperlipidemia    Iron deficiency anemia 12/28/2023   Irritable bowel syndrome    Kidney stones    LPRD (laryngopharyngeal reflux disease) 07/29/2015   Mallory-Weiss tear 07/03/2022   Mild aortic stenosis 07/05/2019   Mitral regurgitation 07/05/2019   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 Here for medical clearance for the right ear. Noted to have recent worsening of her right ear on audiogram at an  outside facility.  Denies any pain.  In general, she is functioning well with her current hearing aid though she is looking at getting a new one.  Left ear with profound loss from prior surgery.  Audio   Murmur 11/01/2013   Normocytic anemia 01/16/2020   Obstructive lung disease (HCC) 07/29/2015   OSA (obstructive sleep apnea) 09/28/2018   Pedal edema 05/06/2019   Primary osteoarthritis of both feet 01/17/2017   Primary osteoarthritis of both knees 01/17/2017   Primary osteoarthritis, left shoulder 12/11/2017   RECTAL INCONTINENCE 05/02/2008   Qualifier: Diagnosis of   By: Marcelo CMA (AAMA), Dottie      Replacing diagnoses that were inactivated after the 02/27/23 regulatory import   Retained orthopedic hardware 11/14/2016   Rheumatoid factor positive 12/13/2016   Sarcoidosis    Severe persistent asthma (HCC) 11/16/2007   Annotation: chronic Qualifier: Diagnosis of  By: Darlean MD, Ozell NOVAK    Shoulder arthritis 02/22/2018   Sleep apnea    no cpap now   Status post dilation of esophageal narrowing    Stiffness of left wrist joint 06/06/2016   Stroke (HCC)    Vitamin D  deficiency 01/17/2017    Family History  Problem Relation  Age of Onset   Esophageal cancer Father    Diabetes Mother    Diabetes Maternal Grandmother    Colon cancer Neg Hx    Rectal cancer Neg Hx    Stomach cancer Neg Hx     Past Surgical History:  Procedure Laterality Date   BIOPSY  12/28/2023   Procedure: BIOPSY;  Surgeon: Federico Rosario BROCKS, MD;  Location: [facility];  Service: Gastroenterology;;   BLADDER SURGERY     CATARACT EXTRACTION Bilateral    COLONOSCOPY WITH PROPOFOL  N/A 12/28/2023   Procedure: COLONOSCOPY WITH PROPOFOL ;  Surgeon: Federico Rosario BROCKS, MD;  Location: [facility];  Service: Gastroenterology;  Laterality: N/A;   ESOPHAGOGASTRODUODENOSCOPY (EGD) WITH PROPOFOL  N/A 12/28/2023   Procedure: ESOPHAGOGASTRODUODENOSCOPY (EGD) WITH PROPOFOL ;  Surgeon: Federico Rosario BROCKS, MD;  Location: [facility];  Service: Gastroenterology;  Laterality: N/A;   HARDWARE REMOVAL Left 01/12/2017   Procedure: HARDWARE REMOVAL left wrist;  Surgeon: Franky Curia, MD;  Location: [facility];  Service: Orthopedics;  Laterality: Left;   HEMORRHOID SURGERY     NASAL SEPTUM SURGERY     PARTIAL HYSTERECTOMY     POLYPECTOMY  12/28/2023   Procedure: POLYPECTOMY;  Surgeon: Federico Rosario BROCKS, MD;  Location: [facility];  Service: Gastroenterology;;   REVERSE SHOULDER ARTHROPLASTY Left 02/22/2018   REVERSE SHOULDER ARTHROPLASTY Left 02/22/2018   Procedure: LEFT REVERSE SHOULDER ARTHROPLASTY;  Surgeon: Addie Cordella Hamilton, MD;  Location: [facility] OR;  Service: Orthopedics;  Laterality: Left;   TONSILLECTOMY AND ADENOIDECTOMY     TRANSCAROTID ARTERY REVASCULARIZATION  Left 01/17/2020   Procedure: TRANSCAROTID ARTERY REVASCULARIZATION;  Surgeon: Serene Gaile ORN, MD;  Location: [facility] OR;  Service: Vascular;  Laterality: Left;   TRIGGER FINGER RELEASE Left 05/12/2016   Procedure: RELEASE TRIGGER FINGER/A-1 PULLEY INFECTION LEFT RING TENDON SHEATH INJECT RIGHT INDEX FINGER;  Surgeon: Franky Curia, MD;  Location: [facility];  Service: Orthopedics;  Laterality:  Left;   TUBAL LIGATION     Social History   Occupational History   Occupation: retired  Tobacco Use   Smoking status: Never   Smokeless tobacco: Never  Vaping Use   Vaping status: Never Used  Substance and Sexual Activity   Alcohol use: No   Drug use: No   Sexual activity:  Not on file

## 2024-10-04 DIAGNOSIS — M461 Sacroiliitis, not elsewhere classified: Secondary | ICD-10-CM | POA: Diagnosis not present

## 2024-10-08 DIAGNOSIS — M461 Sacroiliitis, not elsewhere classified: Secondary | ICD-10-CM | POA: Diagnosis not present

## 2024-10-08 DIAGNOSIS — M5387 Other specified dorsopathies, lumbosacral region: Secondary | ICD-10-CM | POA: Diagnosis not present

## 2024-10-08 DIAGNOSIS — J449 Chronic obstructive pulmonary disease, unspecified: Secondary | ICD-10-CM | POA: Diagnosis not present

## 2024-10-08 DIAGNOSIS — N12 Tubulo-interstitial nephritis, not specified as acute or chronic: Secondary | ICD-10-CM | POA: Diagnosis not present

## 2024-10-08 DIAGNOSIS — G4734 Idiopathic sleep related nonobstructive alveolar hypoventilation: Secondary | ICD-10-CM | POA: Diagnosis not present

## 2024-10-08 DIAGNOSIS — Z1331 Encounter for screening for depression: Secondary | ICD-10-CM | POA: Diagnosis not present

## 2024-10-11 DIAGNOSIS — M461 Sacroiliitis, not elsewhere classified: Secondary | ICD-10-CM | POA: Diagnosis not present

## 2024-10-15 ENCOUNTER — Ambulatory Visit

## 2024-10-15 DIAGNOSIS — J4550 Severe persistent asthma, uncomplicated: Secondary | ICD-10-CM

## 2024-10-15 DIAGNOSIS — K080 Exfoliation of teeth due to systemic causes: Secondary | ICD-10-CM | POA: Diagnosis not present

## 2024-10-16 DIAGNOSIS — M461 Sacroiliitis, not elsewhere classified: Secondary | ICD-10-CM | POA: Diagnosis not present

## 2024-10-17 ENCOUNTER — Ambulatory Visit: Admitting: Orthopedic Surgery

## 2024-10-17 DIAGNOSIS — M1711 Unilateral primary osteoarthritis, right knee: Secondary | ICD-10-CM

## 2024-10-17 DIAGNOSIS — M1712 Unilateral primary osteoarthritis, left knee: Secondary | ICD-10-CM

## 2024-10-18 ENCOUNTER — Encounter: Payer: Self-pay | Admitting: Orthopedic Surgery

## 2024-10-18 DIAGNOSIS — M461 Sacroiliitis, not elsewhere classified: Secondary | ICD-10-CM | POA: Diagnosis not present

## 2024-10-18 MED ORDER — LIDOCAINE HCL 1 % IJ SOLN
5.0000 mL | INTRAMUSCULAR | Status: AC | PRN
  Administered 2024-10-17: 5 mL

## 2024-10-18 MED ORDER — BUPIVACAINE HCL 0.25 % IJ SOLN
4.0000 mL | INTRAMUSCULAR | Status: AC | PRN
  Administered 2024-10-17: 4 mL via INTRA_ARTICULAR

## 2024-10-18 MED ORDER — TRIAMCINOLONE ACETONIDE 40 MG/ML IJ SUSP
40.0000 mg | INTRAMUSCULAR | Status: AC | PRN
  Administered 2024-10-17: 40 mg via INTRA_ARTICULAR

## 2024-10-18 NOTE — Progress Notes (Signed)
 Office Visit Note   Patient: [name]           Date of Birth: September 19, 1946           MRN: [id number] Visit Date: 10/17/2024 Requested by: Clemmie Nest, MD [address] [place],  [place] [address] PCP: Clemmie Nest, MD  Subjective: Chief Complaint  Patient presents with   Left Knee - Pain    Wants aspiration/injection    HPI: [name] is a 78 y.o. female who presents to the office reporting left knee pain.  Patient has severe end-stage arthritis in both knees.  Left knee is little bit worse recently.  Denies any history of injury or trauma.  She has had prior injections with good results in the past..                ROS: All systems reviewed are negative as they relate to the chief complaint within the history of present illness.  Patient denies fevers or chills.  Assessment & Plan: Visit Diagnoses:  1. Primary osteoarthritis of right knee   2. Unilateral primary osteoarthritis, left knee     Plan: Impression is end-stage arthritis in the left knee with fairly significant flexion contracture and decreased range of motion.  No effusion today in either knee.  Cortisone injection into that left knee today to see how she can do at that.  Hopefully she will get 4 to 6 months of relief.  Could consider repeat injection in the future once this wears off.  She still is fairly committed to not undergoing knee replacement.  Follow-up as needed.  Follow-Up Instructions: No follow-ups on file.   Orders:  No orders of the defined types were placed in this encounter.  No orders of the defined types were placed in this encounter.     Procedures: Large Joint Inj: L knee on 10/17/2024 7:40 AM Indications: diagnostic evaluation, joint swelling and pain Details: 18 G 1.5 in needle, superolateral approach  Arthrogram: No  Medications: 5 mL lidocaine  1 %; 4 mL bupivacaine  0.25 %; 40 mg triamcinolone  acetonide 40 MG/ML Outcome: tolerated well, no immediate  complications Procedure, treatment alternatives, risks and benefits explained, specific risks discussed. Consent was given by the patient. Immediately prior to procedure a time out was called to verify the correct patient, procedure, equipment, support staff and site/side marked as required. Patient was prepped and draped in the usual sterile fashion.       Clinical Data: No additional findings.  Objective: Vital Signs: There were no vitals taken for this visit.  Physical Exam:  Constitutional: Patient appears well-developed HEENT:  Head: Normocephalic Eyes:EOM are normal Neck: Normal range of motion Cardiovascular: Normal rate Pulmonary/chest: Effort normal Neurologic: Patient is alert Skin: Skin is warm Psychiatric: Patient has normal mood and affect  Ortho Exam: Ortho exam demonstrates flexion contracture of about 15 degrees on the left.  Bends to around 90.  No effusion.  Collateral crucial ligaments are stable.  Extensor mechanism intact.  Pedal pulses palpable.  Specialty Comments:  CLINICAL DATA:  78 year old female with low back pain radiating down the leg.   EXAM: MRI LUMBAR SPINE WITHOUT CONTRAST   TECHNIQUE: Multiplanar, multisequence MR imaging of the lumbar spine was performed. No intravenous contrast was administered.   COMPARISON:  Lumbar radiographs 02/08/2023.  Lumbar MRI 04/09/2020.   FINDINGS: Segmentation: Normal on the comparison radiographs, the same numbering system used on the previous MRI.   Alignment: Chronic straightening of lumbar lordosis appears  stable since 2021. Mild dextroconvex upper and levoconvex mid to lower lumbar scoliosis as noted on the comparison radiographs (series 14 today). There is subtle degenerative anterolisthesis of L3 on L4. See additional details of that level below.   Vertebrae: Normal background bone marrow signal and maintained vertebral height. Degenerative endplate marrow signal changes at L3-L4 including  mild vertebral marrow edema eccentric to the right (series 107, image 10).   No other marrow edema or acute osseous abnormality. Intact visible sacrum and SI joints.   Conus medullaris and cauda equina: Conus extends to the L1-L2 level. No lower spinal cord or conus signal abnormality. Generally normal cauda equina nerve roots.   Paraspinal and other soft tissues: Negative.   Disc levels:   Visible lower thoracic levels are normal for age through T12-L1.   L1-L2: Mild disc bulging asymmetric to the left. Borderline to mild left facet hypertrophy. No stenosis.   L2-L3: Similar mild asymmetric disc bulging to the left. Mild left facet and mild ligament flavum hypertrophy. No stenosis.   L3-L4: Subtle anterolisthesis. Disc space loss since 2021. Asymmetric disc bulging, bulky on the right side. Progressed and up to moderate right side facet and ligament flavum hypertrophy. No significant spinal stenosis. Borderline to mild right lateral recess stenosis (right L4 nerve level). And moderate progressed right L3 neural foraminal stenosis (series 105, image 7).   L4-L5: Subtle anterolisthesis also at this level. But only minor disc bulging. Mild facet and ligament flavum hypertrophy. No stenosis.   L5-S1:  Negative.   IMPRESSION: 1. Mild lumbar scoliosis. Progressed L3-L4 disc, endplate, and posterior element degeneration since 2021 with subtle anterolisthesis there. Increased and moderate right neural foraminal stenosis, mild right lateral recess stenosis. Query Right L3 and/or L4 radiculitis. 2. Otherwise mild for age lumbar spine degeneration. No spinal stenosis or other neural impingement.     Electronically Signed   By: VEAR Hurst M.D.   On: 10/30/2023 11:23  Imaging: No results found.   PMFS History: Patient Active Problem List   Diagnosis Date Noted   Moderate aortic stenosis 09/03/2024   Stroke (HCC)    Iron deficiency anemia 12/28/2023   Closed fracture of  nasal bones 04/26/2023   Mixed conductive and sensorineural hearing loss of right ear with restricted hearing of left ear 11/08/2022   Change in bowel function 11/07/2022   Mallory-Weiss tear 07/03/2022   Allergy    Anxiety    Arthritis    Cataract    Depression    DM (diabetes mellitus) (HCC)    Dyspnea    Heart murmur    History of kidney stones    Hyperlipidemia    Kidney stones    Sleep apnea    Status post dilation of esophageal narrowing    Asthma 01/16/2020   History of transient ischemic attack (TIA) 01/16/2020   Bilateral carotid artery stenosis 01/16/2020   Normocytic anemia 01/16/2020   Diabetes mellitus type II, non insulin  dependent (HCC) 07/05/2019   Mitral regurgitation 07/05/2019   Pedal edema 05/06/2019   DOE (dyspnea on exertion) 05/06/2019   Hearing loss, mixed, bilateral 12/17/2018   Bilateral primary osteoarthritis of knee 10/16/2018   OSA (obstructive sleep apnea) 09/28/2018   Shoulder arthritis 02/22/2018   Primary osteoarthritis, left shoulder 12/11/2017   DDD (degenerative disc disease), lumbar 01/23/2017   History of diabetes mellitus 01/23/2017   History of depression 01/23/2017   History of asthma 01/23/2017   Dyslipidemia 01/23/2017   Age-related osteoporosis without current pathological fracture 01/23/2017  History of gastroesophageal reflux (GERD) 01/17/2017   Primary osteoarthritis of both knees 01/17/2017   Primary osteoarthritis of both feet 01/17/2017   Vitamin D  deficiency 01/17/2017   Rheumatoid factor positive 12/13/2016   Retained orthopedic hardware 11/14/2016   Stiffness of left wrist joint 06/06/2016   Closed fracture of distal end of radius 06/05/2016   Acquired trigger finger 04/20/2016   Obstructive lung disease (HCC) 07/29/2015   H/O allergic rhinitis 07/29/2015   LPRD (laryngopharyngeal reflux disease) 07/29/2015   Chest pain 11/01/2013   Murmur 11/01/2013   DM 03/03/2010   ABDOMINAL PAIN RIGHT LOWER QUADRANT  03/03/2010   FULL INCONTINENCE OF FECES 03/03/2010   H/O adenomatous polyp of colon 2011   DECREASED HEARING 05/02/2008   Essential hypertension 05/02/2008   ESOPHAGITIS 05/02/2008   HIATAL HERNIA 05/02/2008   RECTAL INCONTINENCE 05/02/2008   SARCOIDOSIS 11/16/2007   Severe persistent asthma (HCC) 11/16/2007   GERD 11/16/2007   IRRITABLE BOWEL SYNDROME 11/16/2007   Past Medical History:  Diagnosis Date   ABDOMINAL PAIN RIGHT LOWER QUADRANT 03/03/2010   Qualifier: Diagnosis of  By: Obie MD, Princella HERO    Acquired trigger finger 04/20/2016   Age-related osteoporosis without current pathological fracture 01/23/2017   Allergy    Anxiety    Arthritis    Asthma    Bilateral carotid artery stenosis 01/16/2020   Bilateral primary osteoarthritis of knee 10/16/2018   Cataract    bilateral and removed   Change in bowel function 11/07/2022   Chest pain 11/01/2013   Closed fracture of distal end of radius 06/05/2016   Closed fracture of nasal bones 04/26/2023   Last Assessment & Plan: Formatting of this note might be different from the original. Nasal trauma. See history of present illness.  Denies nasal obstruction or concern over the appearance of the nose.  CT is reviewed and shows minimally displaced right nasal bone fracture. EXAM shows nasal dorsum in the midline.  Still little edema of the nasal dorsum.  Intranasal exam shows midline nasal septum.   DDD (degenerative disc disease), lumbar 01/23/2017   DECREASED HEARING 05/02/2008   Qualifier: Diagnosis of   By: Marcelo CMA (AAMA), Dottie      Replacing diagnoses that were inactivated after the 02/27/23 regulatory import   Depression    Diabetes mellitus type II, non insulin  dependent (HCC) 07/05/2019   DM 03/03/2010   Qualifier: Diagnosis of  By: Claudene CMA, Burnard     DM (diabetes mellitus) (HCC)    DOE (dyspnea on exertion) 05/06/2019   Dyslipidemia 01/23/2017   Dyspnea    Esophageal reflux    ESOPHAGITIS 05/02/2008    Qualifier: Diagnosis of   By: Marcelo CMA (AAMA), Dottie      Replacing diagnoses that were inactivated after the 02/27/23 regulatory import   Essential hypertension 05/02/2008   Qualifier: Diagnosis of  By: Nelson-Smith CMA (AAMA), Dottie     Full incontinence of feces 03/03/2010   Qualifier: Diagnosis of  By: Obie MD, Princella HERO    H/O adenomatous polyp of colon 2011   H/O allergic rhinitis 07/29/2015   Hearing loss, mixed, bilateral 12/17/2018   Heart murmur    no cardiologist, family Dr. ardelle   HIATAL HERNIA 05/02/2008   Qualifier: Diagnosis of   By: Marcelo CMA (AAMA), Dottie      Replacing diagnoses that were inactivated after the 02/27/23 regulatory import   History of asthma 01/23/2017   History of depression 01/23/2017   History of diabetes mellitus 01/23/2017  History of gastroesophageal reflux (GERD) 01/17/2017   History of kidney stones    History of transient ischemic attack (TIA) 01/16/2020   Hyperlipidemia    Iron deficiency anemia 12/28/2023   Irritable bowel syndrome    Kidney stones    LPRD (laryngopharyngeal reflux disease) 07/29/2015   Mallory-Weiss tear 07/03/2022   Mild aortic stenosis 07/05/2019   Mitral regurgitation 07/05/2019   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 Here for medical clearance for the right ear. Noted to have recent worsening of her right ear on audiogram at an outside facility.  Denies any pain.  In general, she is functioning well with her current hearing aid though she is looking at getting a new one.  Left ear with profound loss from prior surgery.  Audio   Murmur 11/01/2013   Normocytic anemia 01/16/2020   Obstructive lung disease (HCC) 07/29/2015   OSA (obstructive sleep apnea) 09/28/2018   Pedal edema 05/06/2019   Primary osteoarthritis of both feet 01/17/2017   Primary  osteoarthritis of both knees 01/17/2017   Primary osteoarthritis, left shoulder 12/11/2017   RECTAL INCONTINENCE 05/02/2008   Qualifier: Diagnosis of   By: Marcelo CMA (AAMA), Dottie      Replacing diagnoses that were inactivated after the 02/27/23 regulatory import   Retained orthopedic hardware 11/14/2016   Rheumatoid factor positive 12/13/2016   Sarcoidosis    Severe persistent asthma (HCC) 11/16/2007   Annotation: chronic Qualifier: Diagnosis of  By: Darlean MD, Ozell NOVAK    Shoulder arthritis 02/22/2018   Sleep apnea    no cpap now   Status post dilation of esophageal narrowing    Stiffness of left wrist joint 06/06/2016   Stroke (HCC)    Vitamin D  deficiency 01/17/2017    Family History  Problem Relation Age of Onset   Esophageal cancer Father    Diabetes Mother    Diabetes Maternal Grandmother    Colon cancer Neg Hx    Rectal cancer Neg Hx    Stomach cancer Neg Hx     Past Surgical History:  Procedure Laterality Date   BIOPSY  12/28/2023   Procedure: BIOPSY;  Surgeon: Federico Rosario BROCKS, MD;  Location: [facility];  Service: Gastroenterology;;   BLADDER SURGERY     CATARACT EXTRACTION Bilateral    COLONOSCOPY WITH PROPOFOL  N/A 12/28/2023   Procedure: COLONOSCOPY WITH PROPOFOL ;  Surgeon: Federico Rosario BROCKS, MD;  Location: [facility];  Service: Gastroenterology;  Laterality: N/A;   ESOPHAGOGASTRODUODENOSCOPY (EGD) WITH PROPOFOL  N/A 12/28/2023   Procedure: ESOPHAGOGASTRODUODENOSCOPY (EGD) WITH PROPOFOL ;  Surgeon: Federico Rosario BROCKS, MD;  Location: [facility];  Service: Gastroenterology;  Laterality: N/A;   HARDWARE REMOVAL Left 01/12/2017   Procedure: HARDWARE REMOVAL left wrist;  Surgeon: Franky Curia, MD;  Location: [facility];  Service: Orthopedics;  Laterality: Left;   HEMORRHOID SURGERY     NASAL SEPTUM SURGERY     PARTIAL HYSTERECTOMY     POLYPECTOMY  12/28/2023   Procedure: POLYPECTOMY;  Surgeon: Federico Rosario BROCKS, MD;  Location: [facility];  Service:  Gastroenterology;;   REVERSE SHOULDER ARTHROPLASTY Left 02/22/2018   REVERSE SHOULDER ARTHROPLASTY Left 02/22/2018   Procedure: LEFT REVERSE SHOULDER ARTHROPLASTY;  Surgeon: Addie Cordella Hamilton, MD;  Location: [facility] OR;  Service: Orthopedics;  Laterality: Left;   TONSILLECTOMY AND ADENOIDECTOMY     TRANSCAROTID ARTERY REVASCULARIZATION  Left 01/17/2020  Procedure: TRANSCAROTID ARTERY REVASCULARIZATION;  Surgeon: Serene Gaile ORN, MD;  Location: [facility] OR;  Service: Vascular;  Laterality: Left;   TRIGGER FINGER RELEASE Left 05/12/2016   Procedure: RELEASE TRIGGER FINGER/A-1 PULLEY INFECTION LEFT RING TENDON SHEATH INJECT RIGHT INDEX FINGER;  Surgeon: Franky Curia, MD;  Location: [facility];  Service: Orthopedics;  Laterality: Left;   TUBAL LIGATION     Social History   Occupational History   Occupation: retired  Tobacco Use   Smoking status: Never   Smokeless tobacco: Never  Vaping Use   Vaping status: Never Used  Substance and Sexual Activity   Alcohol use: No   Drug use: No   Sexual activity: Not on file

## 2024-10-31 ENCOUNTER — Encounter: Payer: Self-pay | Admitting: Allergy and Immunology

## 2024-10-31 ENCOUNTER — Ambulatory Visit: Admitting: Allergy and Immunology

## 2024-10-31 VITALS — BP 130/82 | HR 60 | Resp 16

## 2024-10-31 DIAGNOSIS — Z862 Personal history of diseases of the blood and blood-forming organs and certain disorders involving the immune mechanism: Secondary | ICD-10-CM | POA: Diagnosis not present

## 2024-10-31 DIAGNOSIS — K219 Gastro-esophageal reflux disease without esophagitis: Secondary | ICD-10-CM

## 2024-10-31 DIAGNOSIS — J4550 Severe persistent asthma, uncomplicated: Secondary | ICD-10-CM | POA: Diagnosis not present

## 2024-10-31 DIAGNOSIS — J3089 Other allergic rhinitis: Secondary | ICD-10-CM | POA: Diagnosis not present

## 2024-10-31 NOTE — Patient Instructions (Addendum)
  1. Continue tezepelumab  injections    2. Use the following every day:  A.  Advair 250 - 1 inhalation 2 times per day B.  Incruse - 1 inhalation 1 time per day C.  Flonase  - 1 spray each nostril 1 time per day.   D.  Montelukast 10 mg - 1 tablet 1 time per day  3. Continue omeprazole  40 twice a day + Famotidine  40 mg in evening  4. If needed:   A. nasal saline   B. Albuterol  HFA or Duoneb   C. OTC Mucinex  DM 1-2 tablet 1-2 times per day   D. ipratropium 0.06% nasal spray - 1-2 sprays each nostril every 6 hours   5. Return to clinic in 6 months or earlier if problem  6. Influenza = Tamiflu. Covid = Paxlovid

## 2024-10-31 NOTE — Progress Notes (Unsigned)
 [place] [facility] [place] [facility] [place] [facility] [place] [facility] [place]   Follow-up Note  Referring Provider: Clemmie Nest, MD Primary Provider: Clemmie Nest, MD Date of Office Visit: 10/31/2024  Subjective:   [name] (DOB: [birth year]) is a 78 y.o. female who [facility] in re-evaluation of the following:  HPI: [name] returns to this clinic in evaluation of asthma, history of fibrotic lung disease from sarcoidosis, allergic rhinitis, LPR, history of untreated sleep apnea.  I last saw her in this clinic for June 2025.  She thinks that her airway has done relatively well since have last seen her in this clinic.  She occasionally has some coughing she occasionally has some runny nose but overall she thinks that she has done well and she has not required a systemic steroid or an antibiotic for any type of airway issue.  She does not exercise.  Her use of a short acting bronchodilators less than 1 time per week while she continues to use Advair and Incruse and Flonase  and montelukast consistently.  She believes that her throat and her reflux is under good control while using a proton pump inhibitor and a H2 receptor blocker on a consistent basis.  Her sleep apnea is under the care of pulmonology and she is attempting to use her CPAP machine as best as possible.  She is having very significant problems with a left lower extremity radiculopathy and has undergone physical therapy and has had a shot into her spine at [facility].  She actually is better and she can now walk.  She has a repeat appointment with [facility] at 1 PM on Monday.  Allergies as of 10/31/2024       Reactions   Codeine Nausea And Vomiting   Sulfa Antibiotics Other (See Comments)   Allergic per pt's mother         Medication List    albuterol  108 (90 Base) MCG/ACT inhaler Commonly known as: VENTOLIN  HFA Inhale 2 puffs into the lungs every 6  (six) hours as needed for wheezing or shortness of breath.   alendronate 70 MG tablet Commonly known as: FOSAMAX Take 70 mg by mouth once a week.   aspirin  EC 81 MG tablet Take 1 tablet by mouth daily.   atorvastatin  40 MG tablet Commonly known as: LIPITOR Take 40 mg by mouth at bedtime.   citalopram  20 MG tablet Commonly known as: CELEXA  Take 20 mg by mouth daily.   famotidine  40 MG tablet Commonly known as: PEPCID  Take 1 tablet (40 mg total) by mouth at bedtime.   fluticasone -salmeterol 250-50 MCG/ACT Aepb Commonly known as: ADVAIR Inhale 1 puff into the lungs every 12 (twelve) hours.   Incruse Ellipta  62.5 MCG/ACT Aepb Generic drug: umeclidinium bromide  Inhale 1 puff into the lungs daily.   ipratropium-albuterol  0.5-2.5 (3) MG/3ML Soln Commonly known as: DUONEB Take 3 mLs by nebulization every 6 (six) hours as needed (shortness of breath/wheezing).   levocetirizine 5 MG tablet Commonly known as: XYZAL Take 5 mg by mouth at bedtime.   metoprolol  succinate 25 MG 24 hr tablet Commonly known as: TOPROL -XL TAKE 1 TABLET BY MOUTH AT BEDTIME   montelukast 10 MG tablet Commonly known as: SINGULAIR Take 10 mg by mouth daily.   omeprazole  40 MG capsule Commonly known as: PRILOSEC Take 40 mg by mouth 2 (two) times daily.   Ozempic  (0.25 or 0.5 MG/DOSE) 2 MG/1.5ML Sopn Generic drug: Semaglutide (0.25 or 0.5MG /DOS) Inject 0.25  mg into the skin once a week.   pregabalin 25 MG capsule Commonly known as: LYRICA Take 25 mg by mouth at bedtime as needed.   PRESERVISION AREDS PO Take by mouth in the morning and at bedtime.   Telmisartan -amLODIPine  80-10 MG Tabs Take 1 tablet by mouth daily.    Past Medical History:  Diagnosis Date   ABDOMINAL PAIN RIGHT LOWER QUADRANT 03/03/2010   Qualifier: Diagnosis of  By: Obie MD, Princella HERO    Acquired trigger finger 04/20/2016   Age-related osteoporosis without current pathological fracture 01/23/2017   Allergy    Anxiety     Arthritis    Asthma    Bilateral carotid artery stenosis 01/16/2020   Bilateral primary osteoarthritis of knee 10/16/2018   Cataract    bilateral and removed   Change in bowel function 11/07/2022   Chest pain 11/01/2013   Closed fracture of distal end of radius 06/05/2016   Closed fracture of nasal bones 04/26/2023   Last Assessment & Plan: Formatting of this note might be different from the original. Nasal trauma. See history of present illness.  Denies nasal obstruction or concern over the appearance of the nose.  CT is reviewed and shows minimally displaced right nasal bone fracture. EXAM shows nasal dorsum in the midline.  Still little edema of the nasal dorsum.  Intranasal exam shows midline nasal septum.   DDD (degenerative disc disease), lumbar 01/23/2017   DECREASED HEARING 05/02/2008   Qualifier: Diagnosis of   By: Marcelo CMA (AAMA), Dottie      Replacing diagnoses that were inactivated after the 02/27/23 regulatory import   Depression    Diabetes mellitus type II, non insulin  dependent (HCC) 07/05/2019   DM 03/03/2010   Qualifier: Diagnosis of  By: Claudene CMA, Burnard     DM (diabetes mellitus) (HCC)    DOE (dyspnea on exertion) 05/06/2019   Dyslipidemia 01/23/2017   Dyspnea    Esophageal reflux    ESOPHAGITIS 05/02/2008   Qualifier: Diagnosis of   By: Marcelo CMA (AAMA), Dottie      Replacing diagnoses that were inactivated after the 02/27/23 regulatory import   Essential hypertension 05/02/2008   Qualifier: Diagnosis of  By: Nelson-Smith CMA (AAMA), Dottie     Full incontinence of feces 03/03/2010   Qualifier: Diagnosis of  By: Obie MD, Princella HERO    H/O adenomatous polyp of colon 2011   H/O allergic rhinitis 07/29/2015   Hearing loss, mixed, bilateral 12/17/2018   Heart murmur    no cardiologist, family Dr. ardelle   HIATAL HERNIA 05/02/2008   Qualifier: Diagnosis of   By: Marcelo CMA (AAMA), Dottie      Replacing diagnoses that were inactivated after the  02/27/23 regulatory import   History of asthma 01/23/2017   History of depression 01/23/2017   History of diabetes mellitus 01/23/2017   History of gastroesophageal reflux (GERD) 01/17/2017   History of kidney stones    History of transient ischemic attack (TIA) 01/16/2020   Hyperlipidemia    Iron deficiency anemia 12/28/2023   Irritable bowel syndrome    Kidney stones    LPRD (laryngopharyngeal reflux disease) 07/29/2015   Mallory-Weiss tear 07/03/2022   Mild aortic stenosis 07/05/2019   Mitral regurgitation 07/05/2019   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 Here for medical clearance for the right ear. Noted to have  recent worsening of her right ear on audiogram at an outside facility.  Denies any pain.  In general, she is functioning well with her current hearing aid though she is looking at getting a new one.  Left ear with profound loss from prior surgery.  Audio   Murmur 11/01/2013   Normocytic anemia 01/16/2020   Obstructive lung disease (HCC) 07/29/2015   OSA (obstructive sleep apnea) 09/28/2018   Pedal edema 05/06/2019   Primary osteoarthritis of both feet 01/17/2017   Primary osteoarthritis of both knees 01/17/2017   Primary osteoarthritis, left shoulder 12/11/2017   RECTAL INCONTINENCE 05/02/2008   Qualifier: Diagnosis of   By: Marcelo CMA (AAMA), Dottie      Replacing diagnoses that were inactivated after the 02/27/23 regulatory import   Retained orthopedic hardware 11/14/2016   Rheumatoid factor positive 12/13/2016   Sarcoidosis    Severe persistent asthma (HCC) 11/16/2007   Annotation: chronic Qualifier: Diagnosis of  By: Darlean MD, Ozell NOVAK    Shoulder arthritis 02/22/2018   Sleep apnea    no cpap now   Status post dilation of esophageal narrowing    Stiffness of left wrist joint 06/06/2016   Stroke (HCC)     Vitamin D  deficiency 01/17/2017    Past Surgical History:  Procedure Laterality Date   BIOPSY  12/28/2023   Procedure: BIOPSY;  Surgeon: Federico Rosario BROCKS, MD;  Location: [facility];  Service: Gastroenterology;;   BLADDER SURGERY     CATARACT EXTRACTION Bilateral    COLONOSCOPY WITH PROPOFOL  N/A 12/28/2023   Procedure: COLONOSCOPY WITH PROPOFOL ;  Surgeon: Federico Rosario BROCKS, MD;  Location: [facility];  Service: Gastroenterology;  Laterality: N/A;   ESOPHAGOGASTRODUODENOSCOPY (EGD) WITH PROPOFOL  N/A 12/28/2023   Procedure: ESOPHAGOGASTRODUODENOSCOPY (EGD) WITH PROPOFOL ;  Surgeon: Federico Rosario BROCKS, MD;  Location: [facility];  Service: Gastroenterology;  Laterality: N/A;   HARDWARE REMOVAL Left 01/12/2017   Procedure: HARDWARE REMOVAL left wrist;  Surgeon: Franky Curia, MD;  Location: [facility];  Service: Orthopedics;  Laterality: Left;   HEMORRHOID SURGERY     NASAL SEPTUM SURGERY     PARTIAL HYSTERECTOMY     POLYPECTOMY  12/28/2023   Procedure: POLYPECTOMY;  Surgeon: Federico Rosario BROCKS, MD;  Location: [facility];  Service: Gastroenterology;;   REVERSE SHOULDER ARTHROPLASTY Left 02/22/2018   REVERSE SHOULDER ARTHROPLASTY Left 02/22/2018   Procedure: LEFT REVERSE SHOULDER ARTHROPLASTY;  Surgeon: Addie Cordella Hamilton, MD;  Location: [facility] OR;  Service: Orthopedics;  Laterality: Left;   TONSILLECTOMY AND ADENOIDECTOMY     TRANSCAROTID ARTERY REVASCULARIZATION  Left 01/17/2020   Procedure: TRANSCAROTID ARTERY REVASCULARIZATION;  Surgeon: Serene Gaile ORN, MD;  Location: [facility] OR;  Service: Vascular;  Laterality: Left;   TRIGGER FINGER RELEASE Left 05/12/2016   Procedure: RELEASE TRIGGER FINGER/A-1 PULLEY INFECTION LEFT RING TENDON SHEATH INJECT RIGHT INDEX FINGER;  Surgeon: Franky Curia, MD;  Location:  SURGERY CENTER;  Service: Orthopedics;  Laterality: Left;   TUBAL LIGATION      Review of systems negative except as noted in HPI / PMHx or noted below:  Review of Systems   Constitutional: Negative.   HENT: Negative.    Eyes: Negative.   Respiratory: Negative.    Cardiovascular: Negative.   Gastrointestinal: Negative.   Genitourinary: Negative.   Musculoskeletal: Negative.   Skin: Negative.   Neurological: Negative.   Endo/Heme/Allergies: Negative.   Psychiatric/Behavioral: Negative.       Objective:   Vitals:   10/31/24 1052  BP: 130/82  Pulse: 60  Resp:  16  SpO2: 99%          Physical Exam Constitutional:      Appearance: She is not diaphoretic.  HENT:     Head: Normocephalic.     Right Ear: Tympanic membrane, ear canal and external ear normal.     Left Ear: Tympanic membrane, ear canal and external ear normal.     Nose: Nose normal. No mucosal edema or rhinorrhea.     Mouth/Throat:     Pharynx: Uvula midline. No oropharyngeal exudate.  Eyes:     Conjunctiva/sclera: Conjunctivae normal.  Neck:     Thyroid : No thyromegaly.     Trachea: Trachea normal. No tracheal tenderness or tracheal deviation.  Cardiovascular:     Rate and Rhythm: Normal rate and regular rhythm.     Heart sounds: Normal heart sounds, S1 normal and S2 normal. No murmur heard. Pulmonary:     Effort: No respiratory distress.     Breath sounds: Normal breath sounds. No stridor. No wheezing or rales.  Lymphadenopathy:     Head:     Right side of head: No tonsillar adenopathy.     Left side of head: No tonsillar adenopathy.     Cervical: No cervical adenopathy.  Skin:    Findings: No erythema or rash.     Nails: There is no clubbing.  Neurological:     Mental Status: She is alert.     Diagnostics: Spirometry was performed and demonstrated an FEV1 of 1.26 at 75 % of predicted.  Assessment and Plan:   1. Asthma, severe persistent, well-controlled (HCC)   2. History of sarcoidosis   3. Other allergic rhinitis   4. LPRD (laryngopharyngeal reflux disease)    1. Continue tezepelumab  injections    2. Use the following every day:  A.  Advair 250 - 1  inhalation 2 times per day B.  Incruse - 1 inhalation 1 time per day C.  Flonase  - 1 spray each nostril 1 time per day.   D.  Montelukast 10 mg - 1 tablet 1 time per day  3. Continue omeprazole  40 twice a day + Famotidine  40 mg in evening  4. If needed:   A. nasal saline   B. Albuterol  HFA or Duoneb   C. OTC Mucinex  DM 1-2 tablet 1-2 times per day   D. ipratropium 0.06% nasal spray - 1-2 sprays each nostril every 6 hours   5. For this recent event:   A. Check home Covid / Flu swab  B. Prednisone  10 mg - 1 tablet now, 1 tablet daily x 5 days  C. Lots of nasal saline   6. Return to clinic in 6 months or earlier if problem  7. Influenza = Tamiflu. Covid = Paxlovid  Abrea appears to be doing pretty well regarding her airway issue and were not really going to change much of her therapy at this point in time and she will continue on a collection of anti-inflammatory agents for airway and also continue to address the issue with reflux induced respiratory disease as noted above.  If she continues to do this well I will see her back in this clinic in 6 months or earlier if there is a problem.   Camellia Denis, MD Allergy / Immunology [facility]

## 2024-11-04 ENCOUNTER — Encounter: Payer: Self-pay | Admitting: Allergy and Immunology

## 2024-11-04 DIAGNOSIS — M461 Sacroiliitis, not elsewhere classified: Secondary | ICD-10-CM | POA: Diagnosis not present

## 2024-11-04 DIAGNOSIS — Z6826 Body mass index (BMI) 26.0-26.9, adult: Secondary | ICD-10-CM | POA: Diagnosis not present

## 2024-11-08 ENCOUNTER — Other Ambulatory Visit: Payer: Self-pay | Admitting: *Deleted

## 2024-11-08 MED ORDER — TEZSPIRE 210 MG/1.91ML ~~LOC~~ SOSY: 210.0000 mg | PREFILLED_SYRINGE | SUBCUTANEOUS | 11 refills | Status: DC

## 2024-11-12 ENCOUNTER — Ambulatory Visit

## 2024-11-12 DIAGNOSIS — S8992XA Unspecified injury of left lower leg, initial encounter: Secondary | ICD-10-CM | POA: Diagnosis not present

## 2024-11-12 DIAGNOSIS — R3129 Other microscopic hematuria: Secondary | ICD-10-CM | POA: Diagnosis not present

## 2024-11-12 DIAGNOSIS — W19XXXA Unspecified fall, initial encounter: Secondary | ICD-10-CM | POA: Diagnosis not present

## 2024-11-12 DIAGNOSIS — R0789 Other chest pain: Secondary | ICD-10-CM | POA: Diagnosis not present

## 2024-11-12 DIAGNOSIS — Z6824 Body mass index (BMI) 24.0-24.9, adult: Secondary | ICD-10-CM | POA: Diagnosis not present

## 2024-11-12 DIAGNOSIS — M25562 Pain in left knee: Secondary | ICD-10-CM | POA: Diagnosis not present

## 2024-11-14 ENCOUNTER — Other Ambulatory Visit: Payer: Self-pay

## 2024-11-14 DIAGNOSIS — R051 Acute cough: Secondary | ICD-10-CM | POA: Diagnosis not present

## 2024-11-14 DIAGNOSIS — R0981 Nasal congestion: Secondary | ICD-10-CM | POA: Diagnosis not present

## 2024-11-19 ENCOUNTER — Ambulatory Visit: Admitting: *Deleted

## 2024-11-19 DIAGNOSIS — J4550 Severe persistent asthma, uncomplicated: Secondary | ICD-10-CM

## 2024-11-25 ENCOUNTER — Encounter: Payer: Self-pay | Admitting: Allergy and Immunology

## 2024-11-25 ENCOUNTER — Ambulatory Visit: Admitting: Allergy and Immunology

## 2024-11-25 VITALS — BP 188/66 | HR 64 | Temp 98.1°F | Resp 22

## 2024-11-25 DIAGNOSIS — Z862 Personal history of diseases of the blood and blood-forming organs and certain disorders involving the immune mechanism: Secondary | ICD-10-CM

## 2024-11-25 DIAGNOSIS — J4551 Severe persistent asthma with (acute) exacerbation: Secondary | ICD-10-CM

## 2024-11-25 DIAGNOSIS — K219 Gastro-esophageal reflux disease without esophagitis: Secondary | ICD-10-CM

## 2024-11-25 DIAGNOSIS — J3089 Other allergic rhinitis: Secondary | ICD-10-CM | POA: Diagnosis not present

## 2024-11-25 DIAGNOSIS — G9331 Postviral fatigue syndrome: Secondary | ICD-10-CM | POA: Diagnosis not present

## 2024-11-25 MED ORDER — METHYLPREDNISOLONE ACETATE 80 MG/ML IJ SUSP
80.0000 mg | Freq: Once | INTRAMUSCULAR | Status: AC
  Administered 2024-11-25: 80 mg via INTRAMUSCULAR

## 2024-11-25 NOTE — Patient Instructions (Addendum)
" °  1. Continue tezepelumab  injections    2. Use the following every day:  A.  Advair 250 - 1 inhalation 2 times per day B.  Incruse - 1 inhalation 1 time per day C.  Flonase  - 1 spray each nostril 1 time per day.   D.  Montelukast 10 mg - 1 tablet 1 time per day  3. Continue omeprazole  40 twice a day + Famotidine  40 mg in evening  4. If needed:   A. nasal saline   B. Albuterol  HFA or Duoneb   C. OTC Mucinex  DM 1-2 tablet 1-2 times per day   D. ipratropium 0.06% nasal spray - 1-2 sprays each nostril every 6 hours   5. For this recent post influenza event:   A. Depomedrol 80 mg IM delivered in clinic  B. Prednisone  30 mg delivered in clinic today  6. Return to clinic in 6 months or earlier if problem  7. Influenza = Tamiflu. Covid = Paxlovid  8.  Is the Ozempic  responsible for your lost appetite???         "

## 2024-11-25 NOTE — Progress Notes (Unsigned)
 "  [place] - [place] - [place] - [place] - [place]   Follow-up Note  Referring Provider: Clemmie Nest, MD Primary Provider: Clemmie Nest, MD Date of Office Visit: 11/25/2024  Subjective:   [name] (DOB: 1946-04-06) is a 78 y.o. female who [facility] in re-evaluation of the following:  HPI: [name] returns to this clinic in evaluation of asthma, history of fibrotic lung disease from sarcoidosis, allergic rhinitis, LPR, history of untreated sleep apnea.  I last saw her in this clinic 31 October 2024.  She contracted influenza and started Tamiflu on 15 November 2024.  Although she is not as sick as she was during her influenza she still has a lingering cough and she has wheezing and it is disturbing her sleep.  She also has left-sided rib pain for which she went to the emergency room and had a chest x-ray which did not identify any significant abnormality.  Interestingly, just before she contracted flu she fell on her left side and hit her chest.  Prior to this event she had really been doing very well as documented in my note of 31 October 2024 and overall had good control of her airway issue and her reflux issue.  She has noted that her appetite has gone down recently and she thinks this actually started prior to her influenza infection.  She is on Ozempic .  Although she cannot smell very well at this point in time because of her influenza she was able to smell with no difficulty prior to this event.  Allergies as of 11/25/2024       Reactions   Codeine Nausea And Vomiting   Sulfa Antibiotics Other (See Comments)   Allergic per pt's mother         Medication List    albuterol  108 (90 Base) MCG/ACT inhaler Commonly known as: VENTOLIN  HFA Inhale 2 puffs into the lungs every 6 (six) hours as needed for wheezing or shortness of breath.   alendronate 70 MG tablet Commonly known as: FOSAMAX Take 70 mg by mouth  once a week.   aspirin  EC 81 MG tablet Take 1 tablet by mouth daily.   atorvastatin  40 MG tablet Commonly known as: LIPITOR Take 40 mg by mouth at bedtime.   citalopram  20 MG tablet Commonly known as: CELEXA  Take 20 mg by mouth daily.   famotidine  40 MG tablet Commonly known as: PEPCID  Take 1 tablet (40 mg total) by mouth at bedtime.   fluticasone -salmeterol 250-50 MCG/ACT Aepb Commonly known as: ADVAIR Inhale 1 puff into the lungs every 12 (twelve) hours.   Incruse Ellipta  62.5 MCG/ACT Aepb Generic drug: umeclidinium bromide  Inhale 1 puff into the lungs daily.   ipratropium-albuterol  0.5-2.5 (3) MG/3ML Soln Commonly known as: DUONEB Take 3 mLs by nebulization every 6 (six) hours as needed (shortness of breath/wheezing).   levocetirizine 5 MG tablet Commonly known as: XYZAL Take 5 mg by mouth at bedtime.   metoprolol  succinate 25 MG 24 hr tablet Commonly known as: TOPROL -XL TAKE 1 TABLET BY MOUTH AT BEDTIME   montelukast 10 MG tablet Commonly known as: SINGULAIR Take 10 mg by mouth daily.   omeprazole  40 MG capsule Commonly known as: PRILOSEC Take 40 mg by mouth 2 (two) times daily.   Ozempic  (0.25 or 0.5 MG/DOSE) 2 MG/1.5ML Sopn Generic drug: Semaglutide (0.25 or 0.5MG /DOS) Inject 0.25 mg into the skin once a week.   pregabalin 25 MG capsule Commonly known as: LYRICA Take 25  mg by mouth at bedtime as needed.   PRESERVISION AREDS PO Take by mouth in the morning and at bedtime.   Telmisartan -amLODIPine  80-5 MG Tabs Take 1 tablet by mouth daily.   Tezspire  210 MG/1. Soaj Generic drug: Tezepelumab -ekko Inject 210 mg into the skin every 28 (twenty-eight) days.     Past Medical History:  Diagnosis Date   ABDOMINAL PAIN RIGHT LOWER QUADRANT 03/03/2010   Qualifier: Diagnosis of  By: Obie MD, Princella HERO    Acquired trigger finger 04/20/2016   Age-related osteoporosis without current pathological fracture 01/23/2017   Allergy    Anxiety    Arthritis     Asthma    Bilateral carotid artery stenosis 01/16/2020   Bilateral primary osteoarthritis of knee 10/16/2018   Cataract    bilateral and removed   Change in bowel function 11/07/2022   Chest pain 11/01/2013   Closed fracture of distal end of radius 06/05/2016   Closed fracture of nasal bones 04/26/2023   Last Assessment & Plan: Formatting of this note might be different from the original. Nasal trauma. See history of present illness.  Denies nasal obstruction or concern over the appearance of the nose.  CT is reviewed and shows minimally displaced right nasal bone fracture. EXAM shows nasal dorsum in the midline.  Still little edema of the nasal dorsum.  Intranasal exam shows midline nasal septum.   DDD (degenerative disc disease), lumbar 01/23/2017   DECREASED HEARING 05/02/2008   Qualifier: Diagnosis of   By: Marcelo CMA (AAMA), Dottie      Replacing diagnoses that were inactivated after the 02/27/23 regulatory import   Depression    Diabetes mellitus type II, non insulin  dependent (HCC) 07/05/2019   DM 03/03/2010   Qualifier: Diagnosis of  By: Claudene CMA, Burnard     DM (diabetes mellitus) (HCC)    DOE (dyspnea on exertion) 05/06/2019   Dyslipidemia 01/23/2017   Dyspnea    Esophageal reflux    ESOPHAGITIS 05/02/2008   Qualifier: Diagnosis of   By: Marcelo CMA (AAMA), Dottie      Replacing diagnoses that were inactivated after the 02/27/23 regulatory import   Essential hypertension 05/02/2008   Qualifier: Diagnosis of  By: Nelson-Smith CMA (AAMA), Dottie     Full incontinence of feces 03/03/2010   Qualifier: Diagnosis of  By: Obie MD, Princella HERO    H/O adenomatous polyp of colon 2011   H/O allergic rhinitis 07/29/2015   Hearing loss, mixed, bilateral 12/17/2018   Heart murmur    no cardiologist, family Dr. ardelle   HIATAL HERNIA 05/02/2008   Qualifier: Diagnosis of   By: Marcelo CMA (AAMA), Dottie      Replacing diagnoses that were inactivated after the 02/27/23  regulatory import   History of asthma 01/23/2017   History of depression 01/23/2017   History of diabetes mellitus 01/23/2017   History of gastroesophageal reflux (GERD) 01/17/2017   History of kidney stones    History of transient ischemic attack (TIA) 01/16/2020   Hyperlipidemia    Iron deficiency anemia 12/28/2023   Irritable bowel syndrome    Kidney stones    LPRD (laryngopharyngeal reflux disease) 07/29/2015   Mallory-Weiss tear 07/03/2022   Mild aortic stenosis 07/05/2019   Mitral regurgitation 07/05/2019   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 Here for medical clearance for the right ear. Noted to  have recent worsening of her right ear on audiogram at an outside facility.  Denies any pain.  In general, she is functioning well with her current hearing aid though she is looking at getting a new one.  Left ear with profound loss from prior surgery.  Audio   Murmur 11/01/2013   Normocytic anemia 01/16/2020   Obstructive lung disease (HCC) 07/29/2015   OSA (obstructive sleep apnea) 09/28/2018   Pedal edema 05/06/2019   Primary osteoarthritis of both feet 01/17/2017   Primary osteoarthritis of both knees 01/17/2017   Primary osteoarthritis, left shoulder 12/11/2017   RECTAL INCONTINENCE 05/02/2008   Qualifier: Diagnosis of   By: Marcelo CMA (AAMA), Dottie      Replacing diagnoses that were inactivated after the 02/27/23 regulatory import   Retained orthopedic hardware 11/14/2016   Rheumatoid factor positive 12/13/2016   Sarcoidosis    Severe persistent asthma (HCC) 11/16/2007   Annotation: chronic Qualifier: Diagnosis of  By: Darlean MD, Ozell NOVAK    Shoulder arthritis 02/22/2018   Sleep apnea    no cpap now   Status post dilation of esophageal narrowing    Stiffness of left wrist joint 06/06/2016   Stroke (HCC)    Vitamin  D deficiency 01/17/2017    Past Surgical History:  Procedure Laterality Date   BIOPSY  12/28/2023   Procedure: BIOPSY;  Surgeon: Federico Rosario BROCKS, MD;  Location: [facility];  Service: Gastroenterology;;   BLADDER SURGERY     CATARACT EXTRACTION Bilateral    COLONOSCOPY WITH PROPOFOL  N/A 12/28/2023   Procedure: COLONOSCOPY WITH PROPOFOL ;  Surgeon: Federico Rosario BROCKS, MD;  Location: [facility];  Service: Gastroenterology;  Laterality: N/A;   ESOPHAGOGASTRODUODENOSCOPY (EGD) WITH PROPOFOL  N/A 12/28/2023   Procedure: ESOPHAGOGASTRODUODENOSCOPY (EGD) WITH PROPOFOL ;  Surgeon: Federico Rosario BROCKS, MD;  Location: [facility];  Service: Gastroenterology;  Laterality: N/A;   HARDWARE REMOVAL Left 01/12/2017   Procedure: HARDWARE REMOVAL left wrist;  Surgeon: Franky Curia, MD;  Location: [facility];  Service: Orthopedics;  Laterality: Left;   HEMORRHOID SURGERY     NASAL SEPTUM SURGERY     PARTIAL HYSTERECTOMY     POLYPECTOMY  12/28/2023   Procedure: POLYPECTOMY;  Surgeon: Federico Rosario BROCKS, MD;  Location: [facility];  Service: Gastroenterology;;   REVERSE SHOULDER ARTHROPLASTY Left 02/22/2018   REVERSE SHOULDER ARTHROPLASTY Left 02/22/2018   Procedure: LEFT REVERSE SHOULDER ARTHROPLASTY;  Surgeon: Addie Cordella Hamilton, MD;  Location[facility];  Service: Orthopedics;  Laterality: Left;   TONSILLECTOMY AND ADENOIDECTOMY     TRANSCAROTID ARTERY REVASCULARIZATION  Left 01/17/2020   Procedure: TRANSCAROTID ARTERY REVASCULARIZATION;  Surgeon: Serene Gaile ORN, MD;  Location: [facility] OR;  Service: Vascular;  Laterality: Left;   TRIGGER FINGER RELEASE Left 05/12/2016   Procedure: RELEASE TRIGGER FINGER/A-1 PULLEY INFECTION LEFT RING TENDON SHEATH INJECT RIGHT INDEX FINGER;  Surgeon: Franky Curia, MD;  Location: [facility];  Service: Orthopedics;  Laterality: Left;   TUBAL LIGATION      Review of systems negative except as noted in HPI / PMHx or noted below:  Review of Systems  Constitutional:  Negative.   HENT: Negative.    Eyes: Negative.   Respiratory: Negative.    Cardiovascular: Negative.   Gastrointestinal: Negative.   Genitourinary: Negative.   Musculoskeletal: Negative.   Skin: Negative.   Neurological: Negative.   Endo/Heme/Allergies: Negative.   Psychiatric/Behavioral: Negative.       Objective:   Vitals:   11/25/24 1708 11/25/24 1752  BP: (!) 166/64 ROLLEN)  188/66  Pulse: 64   Resp: (!) 22   Temp: 98.1 F (36.7 C)   SpO2: 99%           Physical Exam Constitutional:      Appearance: She is not diaphoretic.  HENT:     Head: Normocephalic.     Right Ear: Tympanic membrane, ear canal and external ear normal.     Left Ear: Tympanic membrane, ear canal and external ear normal.     Nose: Nose normal. No mucosal edema or rhinorrhea.     Mouth/Throat:     Pharynx: Uvula midline. No oropharyngeal exudate.  Eyes:     Conjunctiva/sclera: Conjunctivae normal.  Neck:     Thyroid : No thyromegaly.     Trachea: Trachea normal. No tracheal tenderness or tracheal deviation.  Cardiovascular:     Rate and Rhythm: Normal rate and regular rhythm.     Heart sounds: Normal heart sounds, S1 normal and S2 normal. No murmur heard. Pulmonary:     Effort: No respiratory distress.     Breath sounds: No stridor. Wheezing (Bilateral expiratory wheezing posterior lung fields) present. No rales.  Lymphadenopathy:     Head:     Right side of head: No tonsillar adenopathy.     Left side of head: No tonsillar adenopathy.     Cervical: No cervical adenopathy.  Skin:    Findings: No erythema or rash.     Nails: There is no clubbing.  Neurological:     Mental Status: She is alert.     Diagnostics:   Results of a chest x-ray obtained 17 November 2024 identified the following:  Cardiovascular/lungs/pleura: Previously seen cardiomegaly and prominent interstitium has resolved. Resolved bilateral pleural effusion. Residual diffuse interstitial prominence noted . No pleural  effusion or pneumothorax. Other: Similar partially imaged left shoulder arthroplasty. No interval osseous change.   Results of blood tests obtained 17 November 2024 identified WBC 3.5, absolute eosinophil 10, absolute lymphocyte 400, hemoglobin 11.6, platelet 204.  Assessment and Plan:   1. Asthma, not well controlled, severe persistent, with acute exacerbation (HCC)   2. History of sarcoidosis   3. Other allergic rhinitis   4. LPRD (laryngopharyngeal reflux disease)   5. Post-influenza syndrome    1. Continue tezepelumab  injections    2. Use the following every day:  A.  Advair 250 - 1 inhalation 2 times per day B.  Incruse - 1 inhalation 1 time per day C.  Flonase  - 1 spray each nostril 1 time per day.   D.  Montelukast 10 mg - 1 tablet 1 time per day  3. Continue omeprazole  40 twice a day + Famotidine  40 mg in evening  4. If needed:   A. nasal saline   B. Albuterol  HFA or Duoneb   C. OTC Mucinex  DM 1-2 tablet 1-2 times per day   D. ipratropium 0.06% nasal spray - 1-2 sprays each nostril every 6 hours   5. For this recent post influenza event:   A. Depomedrol 80 mg IM delivered in clinic  B. Prednisone  30 mg delivered in clinic today  6. Return to clinic in 6 months or earlier if problem  7. Influenza = Tamiflu. Covid = Paxlovid  8.  Is the Ozempic  responsible for your lost appetite???  Dalilah has had post influenza inflammatory condition of her airway and we will give her systemic steroid to deal with this issue.  I did inform her that she needs to attend to her blood sugars while  using the steroids.  I will assume that she will do well with this treatment and go back to baseline which was actually pretty good while she uses a selection of anti-inflammatory agents for airway including tezepelumab  and also addresses her issue with reflux.  She is having a loss of appetite and this may be secondary to her Ozempic  and she needs to see if she drops weight over time and if so  she may need to modify the amount of Ozempic  she is using.   Camellia Denis, MD Allergy / Immunology [facility] "

## 2024-11-26 ENCOUNTER — Encounter: Payer: Self-pay | Admitting: Allergy and Immunology

## 2024-12-02 ENCOUNTER — Telehealth: Payer: Self-pay

## 2024-12-02 NOTE — Telephone Encounter (Signed)
 [facility] and they were able to get patient in today; asked that she come in as soon as she can.  I contacted patient and asked her to go ahead to the Outpatient Center. Faxed order, insurance card, and most recent Bun/creatinine levels to hospital.   Note: per [employer]: prior approval # [id number]; valid 12/02/2024 to 03/02/2025.

## 2024-12-02 NOTE — Telephone Encounter (Signed)
-----   Message from Camellia Denis, MD sent at 12/02/2024 12:15 PM EST ----- [name] notified us  today that she is still very short of breath.  She has some slight cough but it is really just shortness of breath that is bothering her the most.  She still has left-sided chest pain that apparently was from the result of a fall.  She has not been having any leg pain or leg swelling.  She came by the clinic today and she was very tachypneic.  Her oxygen  saturation was 100% on room air.  We will arrange to obtain a CT chest angio to rule out pulmonary embolus and also have her revisit with cardiology this week.

## 2024-12-05 ENCOUNTER — Encounter: Payer: Self-pay | Admitting: Cardiology

## 2024-12-05 ENCOUNTER — Ambulatory Visit: Payer: Medicare (Managed Care) | Attending: Cardiology | Admitting: Cardiology

## 2024-12-05 VITALS — BP 158/60 | HR 53 | Ht 60.0 in | Wt 129.6 lb

## 2024-12-05 DIAGNOSIS — I350 Nonrheumatic aortic (valve) stenosis: Secondary | ICD-10-CM

## 2024-12-05 DIAGNOSIS — G4733 Obstructive sleep apnea (adult) (pediatric): Secondary | ICD-10-CM

## 2024-12-05 DIAGNOSIS — E782 Mixed hyperlipidemia: Secondary | ICD-10-CM | POA: Diagnosis not present

## 2024-12-05 DIAGNOSIS — I700 Atherosclerosis of aorta: Secondary | ICD-10-CM | POA: Diagnosis not present

## 2024-12-05 DIAGNOSIS — I10 Essential (primary) hypertension: Secondary | ICD-10-CM

## 2024-12-05 DIAGNOSIS — I351 Nonrheumatic aortic (valve) insufficiency: Secondary | ICD-10-CM | POA: Diagnosis not present

## 2024-12-05 DIAGNOSIS — I340 Nonrheumatic mitral (valve) insufficiency: Secondary | ICD-10-CM | POA: Diagnosis not present

## 2024-12-05 MED ORDER — TELMISARTAN-AMLODIPINE 80-10 MG PO TABS: 1.0000 | ORAL_TABLET | Freq: Every day | ORAL | 3 refills | Status: AC

## 2024-12-05 NOTE — Progress Notes (Signed)
 " Cardiology Office Note:    Date:  12/05/2024   ID:  [name], DOB May 13, 1946, MRN [id number]  PCP:  Clemmie Nest, MD  Cardiologist:  Jennifer JONELLE Crape, MD   Referring MD: Clemmie Nest, MD    ASSESSMENT:    1. Essential hypertension   2. Mitral valve insufficiency, unspecified etiology   3. Moderate aortic stenosis   4. Mixed hyperlipidemia   5. OSA (obstructive sleep apnea)   6. Aortic valve insufficiency, etiology of cardiac valve disease unspecified    PLAN:    In order of problems listed above:  Primary prevention stressed with the patient.  Importance of compliance with diet medication stressed and patient verbalized standing. Aortic atherosclerosis: Stable.  Patient aware of diagnosis. Valvular heart disease with aortic stenosis and regurgitation and mitral regurgitation.  He appears clinically asymptomatic at the time.  He echo report from last year reviewed.  Will do a follow-up echocardiogram to evaluate progress. Essential hypertension: Blood pressure is elevated.  Education given about salt intake and dietary issues.  Lifestyle modification urged.  I will increase blood pressure medications.  Patient is fasting and will get complete blood work today.  Patient will keep track of his blood pressures in 2 weeks and get back the log to us . Mixed dyslipidemia: In view of above goal LDL less than 60.  We will evaluate blood work from today and let the patient know. Patient will be seen in follow-up appointment in 6 months or earlier if the patient has any concerns.    Medication Adjustments/Labs and Tests Ordered: Current medicines are reviewed at length with the patient today.  Concerns regarding medicines are outlined above.  Orders Placed This Encounter  Procedures   Comprehensive metabolic panel with GFR   CBC   Lipid panel   TSH   EKG 12-Lead   ECHOCARDIOGRAM COMPLETE   Meds ordered this encounter  Medications   Telmisartan -amLODIPine  80-10 MG  TABS    Sig: Take 1 tablet by mouth daily in the afternoon.    Dispense:  90 tablet    Refill:  3     No chief complaint on file.    History of Present Illness:    [name] is a 79 y.o. female.  Patient has a past medical history of aortic atherosclerosis, aortic stenosis and regurgitation, mitral regurgitation and mixed dyslipidemia.  She has history of essential hypertension.  She denies any problems at this time and takes care of activities of daily living.  No chest pain orthopnea or PND.  She leads a sedentary lifestyle.  She has sleep apnea.  She and her husband mentioned to me that her blood pressure is elevated at home.  At the time of my evaluation, the patient is alert awake oriented and in no distress.  Past Medical History:  Diagnosis Date   ABDOMINAL PAIN RIGHT LOWER QUADRANT 03/03/2010   Qualifier: Diagnosis of  By: Obie MD, Princella HERO    Acquired trigger finger 04/20/2016   Age-related osteoporosis without current pathological fracture 01/23/2017   Allergy    Anxiety    Arthritis    Asthma    Bilateral carotid artery stenosis 01/16/2020   Bilateral primary osteoarthritis of knee 10/16/2018   Cataract    bilateral and removed   Change in bowel function 11/07/2022   Chest pain 11/01/2013   Closed fracture of distal end of radius 06/05/2016   Closed fracture of nasal bones 04/26/2023   Last Assessment & Plan: Formatting  of this note might be different from the original. Nasal trauma. See history of present illness.  Denies nasal obstruction or concern over the appearance of the nose.  CT is reviewed and shows minimally displaced right nasal bone fracture. EXAM shows nasal dorsum in the midline.  Still little edema of the nasal dorsum.  Intranasal exam shows midline nasal septum.   DDD (degenerative disc disease), lumbar 01/23/2017   DECREASED HEARING 05/02/2008   Qualifier: Diagnosis of   By: Marcelo CMA (AAMA), Dottie      Replacing diagnoses that were  inactivated after the 02/27/23 regulatory import   Depression    Diabetes mellitus type II, non insulin  dependent (HCC) 07/05/2019   DM 03/03/2010   Qualifier: Diagnosis of  By: Claudene CMA, Burnard     DM (diabetes mellitus) (HCC)    DOE (dyspnea on exertion) 05/06/2019   Dyslipidemia 01/23/2017   Dyspnea    Esophageal reflux    ESOPHAGITIS 05/02/2008   Qualifier: Diagnosis of   By: Marcelo CMA (AAMA), Dottie      Replacing diagnoses that were inactivated after the 02/27/23 regulatory import   Essential hypertension 05/02/2008   Qualifier: Diagnosis of  By: Nelson-Smith CMA (AAMA), Dottie     Full incontinence of feces 03/03/2010   Qualifier: Diagnosis of  By: Obie MD, Princella HERO    H/O adenomatous polyp of colon 2011   H/O allergic rhinitis 07/29/2015   Hearing loss, mixed, bilateral 12/17/2018   Heart murmur    no cardiologist, family Dr. ardelle   HIATAL HERNIA 05/02/2008   Qualifier: Diagnosis of   By: Marcelo CMA (AAMA), Dottie      Replacing diagnoses that were inactivated after the 02/27/23 regulatory import   History of asthma 01/23/2017   History of depression 01/23/2017   History of diabetes mellitus 01/23/2017   History of gastroesophageal reflux (GERD) 01/17/2017   History of kidney stones    History of transient ischemic attack (TIA) 01/16/2020   Hyperlipidemia    Iron deficiency anemia 12/28/2023   Irritable bowel syndrome    Kidney stones    LPRD (laryngopharyngeal reflux disease) 07/29/2015   Mallory-Weiss tear 07/03/2022   Mitral regurgitation 07/05/2019   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 Here for medical clearance for the right ear. Noted to have recent worsening of her right ear on audiogram at an outside facility.  Denies any pain.  In general, she is functioning well with her current hearing aid  though she is looking at getting a new one.  Left ear with profound loss from prior surgery.  Audio   Moderate aortic stenosis 09/03/2024   Murmur 11/01/2013   Normocytic anemia 01/16/2020   Obstructive lung disease (HCC) 07/29/2015   OSA (obstructive sleep apnea) 09/28/2018   Pedal edema 05/06/2019   Primary osteoarthritis of both feet 01/17/2017   Primary osteoarthritis of both knees 01/17/2017   Primary osteoarthritis, left shoulder 12/11/2017   RECTAL INCONTINENCE 05/02/2008   Qualifier: Diagnosis of   By: Marcelo CMA (AAMA), Dottie      Replacing diagnoses that were inactivated after the 02/27/23 regulatory import   Retained orthopedic hardware 11/14/2016   Rheumatoid factor positive 12/13/2016   Sarcoidosis    Severe persistent asthma (HCC) 11/16/2007   Annotation: chronic Qualifier: Diagnosis of  By: Darlean MD, Ozell NOVAK    Shoulder arthritis 02/22/2018  Sleep apnea    no cpap now   Status post dilation of esophageal narrowing    Stiffness of left wrist joint 06/06/2016   Stroke [facility])    Vitamin D  deficiency 01/17/2017    Past Surgical History:  Procedure Laterality Date   BIOPSY  12/28/2023   Procedure: BIOPSY;  Surgeon: Federico Rosario BROCKS, MD;  Location: [facility];  Service: Gastroenterology;;   BLADDER SURGERY     CATARACT EXTRACTION Bilateral    COLONOSCOPY WITH PROPOFOL  N/A 12/28/2023   Procedure: COLONOSCOPY WITH PROPOFOL ;  Surgeon: Federico Rosario BROCKS, MD;  Location: [facility];  Service: Gastroenterology;  Laterality: N/A;   ESOPHAGOGASTRODUODENOSCOPY (EGD) WITH PROPOFOL  N/A 12/28/2023   Procedure: ESOPHAGOGASTRODUODENOSCOPY (EGD) WITH PROPOFOL ;  Surgeon: Federico Rosario BROCKS, MD;  Location: [facility];  Service: Gastroenterology;  Laterality: N/A;   HARDWARE REMOVAL Left 01/12/2017   Procedure: HARDWARE REMOVAL left wrist;  Surgeon: Franky Curia, MD;  Location: [facility];  Service: Orthopedics;  Laterality: Left;   HEMORRHOID SURGERY     NASAL SEPTUM  SURGERY     PARTIAL HYSTERECTOMY     POLYPECTOMY  12/28/2023   Procedure: POLYPECTOMY;  Surgeon: Federico Rosario BROCKS, MD;  Location: [facility];  Service: Gastroenterology;;   REVERSE SHOULDER ARTHROPLASTY Left 02/22/2018   REVERSE SHOULDER ARTHROPLASTY Left 02/22/2018   Procedure: LEFT REVERSE SHOULDER ARTHROPLASTY;  Surgeon: Addie Cordella Hamilton, MD;  Location: [facility] OR;  Service: Orthopedics;  Laterality: Left;   TONSILLECTOMY AND ADENOIDECTOMY     TRANSCAROTID ARTERY REVASCULARIZATION  Left 01/17/2020   Procedure: TRANSCAROTID ARTERY REVASCULARIZATION;  Surgeon: Serene Gaile ORN, MD;  Location: [facility] OR;  Service: Vascular;  Laterality: Left;   TRIGGER FINGER RELEASE Left 05/12/2016   Procedure: RELEASE TRIGGER FINGER/A-1 PULLEY INFECTION LEFT RING TENDON SHEATH INJECT RIGHT INDEX FINGER;  Surgeon: Franky Curia, MD;  Location: [facility];  Service: Orthopedics;  Laterality: Left;   TUBAL LIGATION      Current Medications: Active Medications[1]   Allergies:   Codeine and Sulfa antibiotics   Social History   Socioeconomic History   Marital status: Married    Spouse name: Not on file   Number of children: 2   Years of education: Not on file   Highest education level: Not on file  Occupational History   Occupation: retired  Tobacco Use   Smoking status: Never   Smokeless tobacco: Never  Vaping Use   Vaping status: Never Used  Substance and Sexual Activity   Alcohol use: No   Drug use: No   Sexual activity: Not on file  Other Topics Concern   Not on file  Social History Narrative   Daily caffeine use.    Social Drivers of Health   Tobacco Use: Low Risk (12/05/2024)   Patient History    Smoking Tobacco Use: Never    Smokeless Tobacco Use: Never    Passive Exposure: Not on file  Financial Resource Strain: Not on file  Food Insecurity: Low Risk (11/21/2022)   Received from [facility]   Epic    Within the past 12 months, you worried that your food would run out  before you got money to buy more: Never true    Within the past 12 months, the food you bought just didn't last and you didn't have money to get more: Not on file  Transportation Needs: No Transportation Needs (11/21/2022)   Received from [facility]    In the past 12 months, has lack  of reliable transportation kept you from medical appointments, meetings, work or from getting things needed for daily living? : No  Physical Activity: Not on file  Stress: Not on file  Social Connections: Not on file  Depression (EYV7-0): Not on file  Alcohol Screen: Not on file  Housing: Low Risk (11/21/2022)   Received from [facility]   Epic    What is your living situation today?: I have a steady place to live    Think about the place you live. Do you have problems with any of the following? Choose all that apply:: None/None on this list  Utilities: Low Risk (11/21/2022)   Received from [facility]   Utilities    In the past 12 months has the electric, gas, oil, or water company threatened to shut off services in your home? : No  Health Literacy: Not on file     Family History: The patient's family history includes Diabetes in her maternal grandmother and mother; Esophageal cancer in her father. There is no history of Colon cancer, Rectal cancer, or Stomach cancer.  ROS:   Please see the history of present illness.    All other systems reviewed and are negative.  EKGs/Labs/Other Studies Reviewed:    The following studies were reviewed today: .SABRA   I discussed my findings with the patient at length   Recent Labs: 08/02/2024: ALT 7; BUN 21; Creatinine, Ser 0.62; Hemoglobin 11.2; Platelets 227; Potassium 4.1; Sodium 136  Recent Lipid Panel    Component Value Date/Time   CHOL 139 07/21/2020 0856   TRIG 92 07/21/2020 0856   HDL 59 07/21/2020 0856   CHOLHDL 2.4 07/21/2020 0856   LDLCALC 63 07/21/2020 0856   LDLDIRECT 109 (H) 02/16/2024 1056    Physical Exam:    VS:   BP (!) 166/70   Pulse (!) 53   Ht 5' (1.524 m)   Wt 129 lb 9.6 oz (58.8 kg)   SpO2 97%   BMI 25.31 kg/m     Wt Readings from Last 3 Encounters:  12/05/24 129 lb 9.6 oz (58.8 kg)  09/23/24 126 lb 6.4 oz (57.3 kg)  09/03/24 133 lb 12.8 oz (60.7 kg)     GEN: Patient is in no acute distress HEENT: Normal NECK: No JVD; No carotid bruits LYMPHATICS: No lymphadenopathy CARDIAC: Hear sounds regular, 2/6 systolic murmur at the apex. RESPIRATORY:  Clear to auscultation without rales, wheezing or rhonchi  ABDOMEN: Soft, non-tender, non-distended MUSCULOSKELETAL:  No edema; No deformity  SKIN: Warm and dry NEUROLOGIC:  Alert and oriented x 3 PSYCHIATRIC:  Normal affect   Signed, Jennifer JONELLE Crape, MD  12/05/2024 9:34 AM    [facility]     [1]  Current Meds  Medication Sig   albuterol  (VENTOLIN  HFA) 108 (90 Base) MCG/ACT inhaler Inhale 2 puffs into the lungs every 6 (six) hours as needed for wheezing or shortness of breath.   alendronate (FOSAMAX) 70 MG tablet Take 70 mg by mouth once a week.   aspirin  81 MG EC tablet Take 1 tablet by mouth daily.   atorvastatin  (LIPITOR) 40 MG tablet Take 40 mg by mouth at bedtime.   citalopram  (CELEXA ) 20 MG tablet Take 20 mg by mouth daily.   famotidine  (PEPCID ) 40 MG tablet Take 1 tablet (40 mg total) by mouth at bedtime.   fluticasone -salmeterol (ADVAIR) 250-50 MCG/ACT AEPB Inhale 1 puff into the lungs every 12 (twelve) hours.   ipratropium-albuterol  (DUONEB) 0.5-2.5 (3) MG/3ML SOLN  Take 3 mLs by nebulization every 6 (six) hours as needed (shortness of breath/wheezing).   levocetirizine (XYZAL) 5 MG tablet Take 5 mg by mouth at bedtime.   metoprolol  succinate (TOPROL -XL) 25 MG 24 hr tablet TAKE 1 TABLET BY MOUTH AT BEDTIME   montelukast (SINGULAIR) 10 MG tablet Take 10 mg by mouth daily.   omeprazole  (PRILOSEC) 40 MG capsule Take 40 mg by mouth 2 (two) times daily.   Semaglutide ,0.25 or 0.5MG /DOS, (OZEMPIC , 0.25 OR 0.5  MG/DOSE,) 2 MG/1.5ML SOPN Inject 0.25 mg into the skin once a week. (Patient taking differently: Inject 0.25 mg into the skin every 14 (fourteen) days.)   Telmisartan -amLODIPine  80-10 MG TABS Take 1 tablet by mouth daily in the afternoon.   Tezepelumab -ekko (TEZSPIRE ) 210 MG/1. SOAJ Inject 210 mg into the skin every 28 (twenty-eight) days.   umeclidinium bromide  (INCRUSE ELLIPTA ) 62.5 MCG/ACT AEPB Inhale 1 puff into the lungs daily.   [DISCONTINUED] pregabalin (LYRICA) 25 MG capsule Take 25 mg by mouth at bedtime as needed.   [DISCONTINUED] Telmisartan -amLODIPine  80-5 MG TABS Take 1 tablet by mouth daily.   Current Facility-Administered Medications for the 12/05/24 encounter (Office Visit) with Reha Martinovich R, MD  Medication   tezepelumab -ekko (TEZSPIRE ) 210 MG/1. syringe 210 mg   "

## 2024-12-05 NOTE — Patient Instructions (Signed)
 Medication Instructions:  Your physician has recommended you make the following change in your medication:   Stop Telmisartan -Amlodipine  80-5 mg  Start Telmisartan -Amlodipine  80-10 mg daily. A new RX has been sent to your pharmacy.   Keep a BP log for 2 weeks and send in for Dr. Edwyna to review.  *If you need a refill on your cardiac medications before your next appointment, please call your pharmacy*   Lab Work: Your physician recommends that you have a CMP, CBC, TSH and lipids today in the office.  If you have labs (blood work) drawn today and your tests are completely normal, you will receive your results only by: MyChart Message (if you have MyChart) OR A paper copy in the mail If you have any lab test that is abnormal or we need to change your treatment, we will call you to review the results.   Testing/Procedures: Your physician has requested that you have an echocardiogram. Echocardiography is a painless test that uses sound waves to create images of your heart. It provides your doctor with information about the size and shape of your heart and how well your hearts chambers and valves are working. This procedure takes approximately one hour. There are no restrictions for this procedure. Please do NOT wear cologne, perfume, aftershave, or lotions (deodorant is allowed). Please arrive 15 minutes prior to your appointment time.  Please note: We ask at that you not bring children with you during ultrasound (echo/ vascular) testing. Due to room size and safety concerns, children are not allowed in the ultrasound rooms during exams. Our front office staff cannot provide observation of children in our lobby area while testing is being conducted. An adult accompanying a patient to their appointment will only be allowed in the ultrasound room at the discretion of the ultrasound technician under special circumstances. We apologize for any inconvenience.    Follow-Up: At [facility], you and your health needs are our priority.  As part of our continuing mission to provide you with exceptional heart care, we have created designated Provider Care Teams.  These Care Teams include your primary Cardiologist (physician) and Advanced Practice Providers (APPs -  Physician Assistants and Nurse Practitioners) who all work together to provide you with the care you need, when you need it.  We recommend signing up for the patient portal called MyChart.  Sign up information is provided on this After Visit Summary.  MyChart is used to connect with patients for Virtual Visits (Telemedicine).  Patients are able to view lab/test results, encounter notes, upcoming appointments, etc.  Non-urgent messages can be sent to your provider as well.   To learn more about what you can do with MyChart, go to [email or web address].    Your next appointment:   9 month(s)  The format for your next appointment:   In Person  Provider:   Jennifer Edwyna, MD    Other Instructions Echocardiogram An echocardiogram is a test that uses sound waves to make images of your heart. This way of making images is often called ultrasound. The images from this test can help find out many things about your heart, including: The size and shape of your heart. The strength of your heart muscle and how well it's working. The size, thickness, and movement of your heart's walls. How your heart valves are working. Problems such as: A tumor or a growth from an infection around the heart valves. Areas of heart muscle that aren't working well because of poor blood  flow or injury from a heart attack. An aneurysm. This is a weak or damaged part of an artery wall. An artery is a blood vessel. Tell a health care provider about: Any allergies you have. All medicines you're taking, including vitamins, herbs, eye drops, creams, and over-the-counter medicines. Any bleeding problems you have. Any surgeries you've  had. Any medical problems you have. Whether you're pregnant or may be pregnant. What are the risks? Your health care provider will talk with you about risks. These may include an allergic reaction to IV dye that may be used during the test. What happens before the test? You don't need to do anything to get ready for this test. You may eat and drink normally. What happens during the test?  You'll take off your clothes from the waist up and put on a hospital gown. Sticky patches called electrodes may be placed on your chest. These will be connected to a machine that monitors your heart rate and rhythm. You'll lie down on a table for the exam. A wand covered in gel will be moved over your chest. Sound waves from the wand will go to your heart and bounce back--or echo back. The sound waves will go to a computer that uses them to make images of your heart. The images can be viewed on a monitor. The images will also be recorded on the computer so your provider can look at them later. You may be asked to change positions or hold your breath for a short time. This makes it easier to get different views or better views of your heart. In some cases, you may be given a dye through an IV. The IV is put into one of your veins. This dye can make the areas of your heart easier to see. The procedure may vary among providers and hospitals. What can I expect after the test? You may return to your normal diet, activities, and medicines unless your provider tells you not to. If an IV was placed for the test, it will be removed. It's up to you to get the results of your test. Ask your provider, or the department that's doing the test, when your results will be ready. This information is not intended to replace advice given to you by your health care provider. Make sure you discuss any questions you have with your health care provider. Document Revised: 01/13/2023 Document Reviewed: 01/13/2023 Elsevier Patient  Education  2024 Elsevier Inc.  Important Information About Sugar

## 2024-12-06 ENCOUNTER — Ambulatory Visit: Payer: Self-pay | Admitting: Cardiology

## 2024-12-06 LAB — COMPREHENSIVE METABOLIC PANEL WITH GFR
ALT: 10 IU/L (ref 0–32)
AST: 21 IU/L (ref 0–40)
Albumin: 4.1 g/dL (ref 3.8–4.8)
Alkaline Phosphatase: 116 IU/L (ref 49–135)
BUN/Creatinine Ratio: 25 (ref 12–28)
BUN: 16 mg/dL (ref 8–27)
Bilirubin Total: 0.6 mg/dL (ref 0.0–1.2)
CO2: 24 mmol/L (ref 20–29)
Calcium: 9 mg/dL (ref 8.7–10.3)
Chloride: 104 mmol/L (ref 96–106)
Creatinine, Ser: 0.65 mg/dL (ref 0.57–1.00)
Globulin, Total: 2.2 g/dL (ref 1.5–4.5)
Glucose: 125 mg/dL — ABNORMAL HIGH (ref 70–99)
Potassium: 4 mmol/L (ref 3.5–5.2)
Sodium: 141 mmol/L (ref 134–144)
Total Protein: 6.3 g/dL (ref 6.0–8.5)
eGFR: 90 mL/min/1.73

## 2024-12-06 LAB — CBC
Hematocrit: 35.6 % (ref 34.0–46.6)
Hemoglobin: 11.4 g/dL (ref 11.1–15.9)
MCH: 31.5 pg (ref 26.6–33.0)
MCHC: 32 g/dL (ref 31.5–35.7)
MCV: 98 fL — ABNORMAL HIGH (ref 79–97)
Platelets: 314 x10E3/uL (ref 150–450)
RBC: 3.62 x10E6/uL — ABNORMAL LOW (ref 3.77–5.28)
RDW: 13.4 % (ref 11.7–15.4)
WBC: 7 x10E3/uL (ref 3.4–10.8)

## 2024-12-06 LAB — LIPID PANEL
Chol/HDL Ratio: 1.9 ratio (ref 0.0–4.4)
Cholesterol, Total: 140 mg/dL (ref 100–199)
HDL: 74 mg/dL
LDL Chol Calc (NIH): 53 mg/dL (ref 0–99)
Triglycerides: 65 mg/dL (ref 0–149)
VLDL Cholesterol Cal: 13 mg/dL (ref 5–40)

## 2024-12-06 LAB — TSH: TSH: 0.967 u[IU]/mL (ref 0.450–4.500)

## 2024-12-06 NOTE — Telephone Encounter (Signed)
 Left VM to return call

## 2024-12-06 NOTE — Telephone Encounter (Signed)
-----   Message from Jennifer Crape, MD sent at 12/06/2024 12:49 PM EST ----- The results of the study is unremarkable. Please inform patient. I will discuss in detail at next appointment. Cc  primary care/referring physician Jennifer JONELLE Crape, MD 12/06/2024 12:49 PM

## 2024-12-10 ENCOUNTER — Telehealth: Payer: Self-pay | Admitting: *Deleted

## 2024-12-10 NOTE — Telephone Encounter (Signed)
-----   Message from Camellia Denis, MD sent at 12/10/2024  8:46 AM EST ----- Please let [name] know that we took a look at her CT scan and there does not appear to be any clots in her lungs which is a very good thing.  She does have some evidence of previous inflammation and nodules in her lung and it has been a long time since she has seen pulmonary and I think it be best if we made her an appointment to see pulmonary in [place].  She does see the sleep pulmonologist but I am not sure that he is the one to address this issue and maybe she can go back and see the other pulmonologist that she has seen in the past.

## 2024-12-10 NOTE — Telephone Encounter (Signed)
 [name] informed and she is going to call her pulmonologist to make an appointment.

## 2024-12-11 ENCOUNTER — Other Ambulatory Visit: Payer: Self-pay | Admitting: Allergy and Immunology

## 2024-12-11 ENCOUNTER — Encounter: Payer: Self-pay | Admitting: *Deleted

## 2024-12-12 ENCOUNTER — Other Ambulatory Visit: Payer: Self-pay | Admitting: Cardiology

## 2024-12-16 ENCOUNTER — Encounter: Payer: Self-pay | Admitting: Pulmonary Disease

## 2024-12-16 ENCOUNTER — Ambulatory Visit: Payer: Medicare (Managed Care) | Admitting: Pulmonary Disease

## 2024-12-16 VITALS — BP 166/74 | HR 54 | Temp 97.7°F | Ht 60.0 in | Wt 129.0 lb

## 2024-12-16 DIAGNOSIS — J439 Emphysema, unspecified: Secondary | ICD-10-CM | POA: Diagnosis not present

## 2024-12-16 DIAGNOSIS — J479 Bronchiectasis, uncomplicated: Secondary | ICD-10-CM

## 2024-12-16 DIAGNOSIS — R918 Other nonspecific abnormal finding of lung field: Secondary | ICD-10-CM | POA: Diagnosis not present

## 2024-12-16 DIAGNOSIS — D860 Sarcoidosis of lung: Secondary | ICD-10-CM | POA: Diagnosis not present

## 2024-12-16 DIAGNOSIS — G4733 Obstructive sleep apnea (adult) (pediatric): Secondary | ICD-10-CM | POA: Diagnosis not present

## 2024-12-16 DIAGNOSIS — Z862 Personal history of diseases of the blood and blood-forming organs and certain disorders involving the immune mechanism: Secondary | ICD-10-CM

## 2024-12-16 NOTE — Patient Instructions (Signed)
 Follow-up in 6 months  CT scan of the chest without contrast in 6 months  Try and get back to using your CPAP on a regular basis once you feel better and you are less congested  Call us  with significant concerns

## 2024-12-16 NOTE — Progress Notes (Signed)
 "              [name]    [id number]    05/10/1946  Primary Care Physician:Tetter, Elston NOVAK, NP  Referring Physician: Clemmie Nest, MD [address] [place],  [place] [address]  Chief complaint:   Follow-up for obstructive sleep apnea, abnormal CT scan of the chest  Discussed the use of AI scribe software for clinical note transcription with the patient, who gave verbal consent to proceed.  History of Present Illness [name] is a 79 year old female with sarcoidosis who presents with concerns about lung nodules and sleep apnea management.  She recently underwent a CT scan of her lungs, which revealed multiple nodules nodules. She was told the nodules may be related to a past infection or her history of sarcoidosis, which was diagnosed approximately 30 years ago.  She experienced a prolonged flu lasting over three weeks, during which she was unable to use her CPAP machine. Since recovering from the flu, she has not resumed regular CPAP use, citing difficulty in using it due to feeling unable to 'hold it without breathing out'. Prior to the flu, she was using the CPAP regularly. She reports having had a 'little light stroke' in the past.  No regular coughing or bringing up mucus, although she did cough up something once this morning. She does not report feeling sick or under the weather currently.  Has not been having any symptoms to suggest active sarcoidosis recently  She has a history of obstructive lung disease, longstanding history of asthma as a child Continues to use in a diet which includes Incruse Does have a history of pulmonary sarcoidosis with bronchiectasis  Follows up with allergist Dr. Kozlow, on Tezspire    Outpatient Encounter Medications as of 12/16/2024  Medication Sig   albuterol  (VENTOLIN  HFA) 108 (90 Base) MCG/ACT inhaler Inhale 2 puffs into the lungs every 6 (six) hours as needed for wheezing or shortness of breath.   alendronate (FOSAMAX) 70  MG tablet Take 70 mg by mouth once a week.   aspirin  81 MG EC tablet Take 1 tablet by mouth daily.   atorvastatin  (LIPITOR) 40 MG tablet Take 40 mg by mouth at bedtime.   citalopram  (CELEXA ) 20 MG tablet Take 20 mg by mouth daily.   famotidine  (PEPCID ) 40 MG tablet TAKE 1 TABLET BY MOUTH AT BEDTIME   fluticasone -salmeterol (ADVAIR) 250-50 MCG/ACT AEPB Inhale 1 puff into the lungs every 12 (twelve) hours.   ipratropium-albuterol  (DUONEB) 0.5-2.5 (3) MG/3ML SOLN Take 3 mLs by nebulization every 6 (six) hours as needed (shortness of breath/wheezing).   levocetirizine (XYZAL) 5 MG tablet Take 5 mg by mouth at bedtime.   metoprolol  succinate (TOPROL -XL) 25 MG 24 hr tablet TAKE 1 TABLET BY MOUTH AT BEDTIME   montelukast (SINGULAIR) 10 MG tablet Take 10 mg by mouth daily.   omeprazole  (PRILOSEC) 40 MG capsule Take 40 mg by mouth 2 (two) times daily.   Semaglutide ,0.25 or 0.5MG /DOS, (OZEMPIC , 0.25 OR 0.5 MG/DOSE,) 2 MG/1.5ML SOPN Inject 0.25 mg into the skin once a week.   Telmisartan -amLODIPine  80-10 MG TABS Take 1 tablet by mouth daily in the afternoon.   Tezepelumab -ekko (TEZSPIRE ) 210 MG/1. SOAJ Inject 210 mg into the skin every 28 (twenty-eight) days.   umeclidinium bromide  (INCRUSE ELLIPTA ) 62.5 MCG/ACT AEPB Inhale 1 puff into the lungs daily.   Facility-Administered Encounter Medications as of 12/16/2024  Medication   tezepelumab -ekko (TEZSPIRE ) 210 MG/1. syringe 210 mg  Allergies as of 12/16/2024 - Review Complete 12/05/2024  Allergen Reaction Noted   Codeine Nausea And Vomiting 05/20/2015   Sulfa antibiotics Other (See Comments) 05/20/2015    Past Medical History:  Diagnosis Date   ABDOMINAL PAIN RIGHT LOWER QUADRANT 03/03/2010   Qualifier: Diagnosis of  By: Obie MD, Princella HERO    Acquired trigger finger 04/20/2016   Age-related osteoporosis without current pathological fracture 01/23/2017   Allergy    Anxiety    Arthritis    Asthma    Bilateral carotid artery stenosis  01/16/2020   Bilateral primary osteoarthritis of knee 10/16/2018   Cataract    bilateral and removed   Change in bowel function 11/07/2022   Chest pain 11/01/2013   Closed fracture of distal end of radius 06/05/2016   Closed fracture of nasal bones 04/26/2023   Last Assessment & Plan: Formatting of this note might be different from the original. Nasal trauma. See history of present illness.  Denies nasal obstruction or concern over the appearance of the nose.  CT is reviewed and shows minimally displaced right nasal bone fracture. EXAM shows nasal dorsum in the midline.  Still little edema of the nasal dorsum.  Intranasal exam shows midline nasal septum.   DDD (degenerative disc disease), lumbar 01/23/2017   DECREASED HEARING 05/02/2008   Qualifier: Diagnosis of   By: Marcelo CMA (AAMA), Dottie      Replacing diagnoses that were inactivated after the 02/27/23 regulatory import   Depression    Diabetes mellitus type II, non insulin  dependent (HCC) 07/05/2019   DM 03/03/2010   Qualifier: Diagnosis of  By: Claudene CMA, Burnard     DM (diabetes mellitus) (HCC)    DOE (dyspnea on exertion) 05/06/2019   Dyslipidemia 01/23/2017   Dyspnea    Esophageal reflux    ESOPHAGITIS 05/02/2008   Qualifier: Diagnosis of   By: Marcelo CMA (AAMA), Dottie      Replacing diagnoses that were inactivated after the 02/27/23 regulatory import   Essential hypertension 05/02/2008   Qualifier: Diagnosis of  By: Nelson-Smith CMA (AAMA), Dottie     Full incontinence of feces 03/03/2010   Qualifier: Diagnosis of  By: Obie MD, Princella HERO    H/O adenomatous polyp of colon 2011   H/O allergic rhinitis 07/29/2015   Hearing loss, mixed, bilateral 12/17/2018   Heart murmur    no cardiologist, family Dr. ardelle   HIATAL HERNIA 05/02/2008   Qualifier: Diagnosis of   By: Marcelo CMA (AAMA), Dottie      Replacing diagnoses that were inactivated after the 02/27/23 regulatory import   History of asthma 01/23/2017    History of depression 01/23/2017   History of diabetes mellitus 01/23/2017   History of gastroesophageal reflux (GERD) 01/17/2017   History of kidney stones    History of transient ischemic attack (TIA) 01/16/2020   Hyperlipidemia    Iron deficiency anemia 12/28/2023   Irritable bowel syndrome    Kidney stones    LPRD (laryngopharyngeal reflux disease) 07/29/2015   Mallory-Weiss tear 07/03/2022   Mitral regurgitation 07/05/2019   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 Here for medical clearance for the right ear. Noted to have recent worsening of her right ear on audiogram at an outside facility.  Denies any pain.  In general, she is functioning well with her current hearing aid though she is  looking at getting a new one.  Left ear with profound loss from prior surgery.  Audio   Moderate aortic stenosis 09/03/2024   Murmur 11/01/2013   Normocytic anemia 01/16/2020   Obstructive lung disease (HCC) 07/29/2015   OSA (obstructive sleep apnea) 09/28/2018   Pedal edema 05/06/2019   Primary osteoarthritis of both feet 01/17/2017   Primary osteoarthritis of both knees 01/17/2017   Primary osteoarthritis, left shoulder 12/11/2017   RECTAL INCONTINENCE 05/02/2008   Qualifier: Diagnosis of   By: Marcelo CMA (AAMA), Dottie      Replacing diagnoses that were inactivated after the 02/27/23 regulatory import   Retained orthopedic hardware 11/14/2016   Rheumatoid factor positive 12/13/2016   Sarcoidosis    Severe persistent asthma (HCC) 11/16/2007   Annotation: chronic Qualifier: Diagnosis of  By: Darlean MD, Ozell NOVAK    Shoulder arthritis 02/22/2018   Sleep apnea    no cpap now   Status post dilation of esophageal narrowing    Stiffness of left wrist joint 06/06/2016   Stroke (HCC)    Vitamin D  deficiency 01/17/2017    Past Surgical  History:  Procedure Laterality Date   BIOPSY  12/28/2023   Procedure: BIOPSY;  Surgeon: Federico Rosario BROCKS, MD;  Location: [facility];  Service: Gastroenterology;;   BLADDER SURGERY     CATARACT EXTRACTION Bilateral    COLONOSCOPY WITH PROPOFOL  N/A 12/28/2023   Procedure: COLONOSCOPY WITH PROPOFOL ;  Surgeon: Federico Rosario BROCKS, MD;  Location: [facility];  Service: Gastroenterology;  Laterality: N/A;   ESOPHAGOGASTRODUODENOSCOPY (EGD) WITH PROPOFOL  N/A 12/28/2023   Procedure: ESOPHAGOGASTRODUODENOSCOPY (EGD) WITH PROPOFOL ;  Surgeon: Federico Rosario BROCKS, MD;  Location: [facility];  Service: Gastroenterology;  Laterality: N/A;   HARDWARE REMOVAL Left 01/12/2017   Procedure: HARDWARE REMOVAL left wrist;  Surgeon: Franky Curia, MD;  Location: [facility];  Service: Orthopedics;  Laterality: Left;   HEMORRHOID SURGERY     NASAL SEPTUM SURGERY     PARTIAL HYSTERECTOMY     POLYPECTOMY  12/28/2023   Procedure: POLYPECTOMY;  Surgeon: Federico Rosario BROCKS, MD;  Location: [facility];  Service: Gastroenterology;;   REVERSE SHOULDER ARTHROPLASTY Left 02/22/2018   REVERSE SHOULDER ARTHROPLASTY Left 02/22/2018   Procedure: LEFT REVERSE SHOULDER ARTHROPLASTY;  Surgeon: Addie Cordella Hamilton, MD;  Location: [facility];  Service: Orthopedics;  Laterality: Left;   TONSILLECTOMY AND ADENOIDECTOMY     TRANSCAROTID ARTERY REVASCULARIZATION  Left 01/17/2020   Procedure: TRANSCAROTID ARTERY REVASCULARIZATION;  Surgeon: Serene Gaile ORN, MD;  Location: [facility] OR;  Service: Vascular;  Laterality: Left;   TRIGGER FINGER RELEASE Left 05/12/2016   Procedure: RELEASE TRIGGER FINGER/A-1 PULLEY INFECTION LEFT RING TENDON SHEATH INJECT RIGHT INDEX FINGER;  Surgeon: Franky Curia, MD;  Location: [facility];  Service: Orthopedics;  Laterality: Left;   TUBAL LIGATION      Family History  Problem Relation Age of Onset   Esophageal cancer Father    Diabetes Mother    Diabetes Maternal Grandmother    Colon cancer Neg Hx     Rectal cancer Neg Hx    Stomach cancer Neg Hx     Social History   Socioeconomic History   Marital status: Married    Spouse name: Not on file   Number of children: 2   Years of education: Not on file   Highest education level: Not on file  Occupational History   Occupation: retired  Tobacco Use   Smoking status: Never   Smokeless tobacco: Never  Advertising Account [employer]  Vaping status: Never Used  Substance and Sexual Activity   Alcohol use: No   Drug use: No   Sexual activity: Not on file  Other Topics Concern   Not on file  Social History Narrative   Daily caffeine use.    Social Drivers of Health   Tobacco Use: Low Risk (12/16/2024)   Patient History    Smoking Tobacco Use: Never    Smokeless Tobacco Use: Never    Passive Exposure: Not on file  Financial Resource Strain: Not on file  Food Insecurity: Low Risk (12/05/2024)   Received from [facility]   Epic    Within the past 12 months, you worried that your food would run out before you got money to buy more: Never true    Within the past 12 months, the food you bought just didn't last and you didn't have money to get more. : Never true  Transportation Needs: No Transportation Needs (12/05/2024)   Received from [facility]    In the past 12 months, has lack of reliable transportation kept you from medical appointments, meetings, work or from getting things needed for daily living? : No  Physical Activity: Not on file  Stress: Not on file  Social Connections: Not on file  Intimate Partner Violence: Low Risk  (11/21/2022)   Received from [facility] visits prior to 01/28/2023.   Safety    How often does anyone, including family and friends, physically hurt you?: Never    How often does anyone, including family and friends, insult or talk down to you?: Never    How often does anyone, including family and friends, threaten you with harm?: Never    How often does anyone, including family and  friends, scream or curse at you?: Never  Depression (PHQ2-9): Not on file  Alcohol Screen: Not on file  Housing: Low Risk (12/05/2024)   Received from [facility]   Epic    What is your living situation today?: I have a steady place to live    Think about the place you live. Do you have problems with any of the following? Choose all that apply:: None/None on this list  Utilities: Low Risk (12/05/2024)   Received from [facility]   Utilities    In the past 12 months has the electric, gas, oil, or water company threatened to shut off services in your home? : No  Health Literacy: Not on file    Review of Systems  Respiratory:  Positive for apnea and shortness of breath.   Psychiatric/Behavioral:  Positive for sleep disturbance.     Vitals:   12/16/24 1326  BP: (!) 166/74  Pulse: (!) 54  Temp: 97.7 F (36.5 C)  SpO2: 98%     Physical Exam Constitutional:      Appearance: She is obese.  HENT:     Head: Normocephalic.     Nose: Nose normal.     Mouth/Throat:     Mouth: Mucous membranes are moist.  Eyes:     General: No scleral icterus. Cardiovascular:     Rate and Rhythm: Normal rate and regular rhythm.     Heart sounds: No murmur heard.    No friction rub.  Pulmonary:     Effort: No respiratory distress.     Breath sounds: No stridor. No wheezing or rhonchi.  Musculoskeletal:        General: Normal range of motion.     Cervical back:  No rigidity or tenderness.  Skin:    General: Skin is warm.  Neurological:     General: No focal deficit present.     Mental Status: She is alert.  Psychiatric:        Mood and Affect: Mood normal.    Data Reviewed: CT scan 12/02/2024 shows multiple small pulmonary nodules in the right upper lobe, right middle lobe and lingula largest measuring 5 mm Bronchiectatic changes in the left upper lobe mild hepatomegaly  Assessment and Plan Assessment & Plan Obstructive sleep apnea Non-compliance to CPAP therapy due to recent influenza  infection and nasal congestion. Risks of untreated sleep apnea include cardiovascular stress, stroke, arrhythmia, and heart failure. She has experienced a minor stroke, highlighting the importance of CPAP adherence. - Encouraged resumption of CPAP therapy when feeling better and nasal congestion resolves.  Pulmonary nodules secondary to pulmonary sarcoidosis Pulmonary nodules identified on CT scan, likely related to sarcoidosis diagnosed approximately 30 years ago. Nodules are stable and not suggestive of malignancy. No current symptoms of infection or bronchiectasis. Sarcoidosis may cause persistent lung spots, but stability is key to monitoring. - Ordered repeat CT scan in six months to monitor stability of pulmonary nodules.  History of emphysema - Continue Incruse -Continue albuterol  as needed  Follow-up in about 6 months   No orders of the defined types were placed in this encounter.   I personally spent a total of 30 minutes in the care of the patient today including preparing to see the patient, getting/reviewing separately obtained history, performing a medically appropriate exam/evaluation, counseling and educating, placing orders, documenting clinical information in the EHR, and independently interpreting results.   Jennet Epley MD [facility] 12/16/2024, 1:47 PM  CC: Clemmie Nest, MD   "

## 2024-12-17 ENCOUNTER — Ambulatory Visit: Payer: Medicare (Managed Care)

## 2024-12-17 DIAGNOSIS — J4550 Severe persistent asthma, uncomplicated: Secondary | ICD-10-CM

## 2024-12-26 ENCOUNTER — Ambulatory Visit
Admission: RE | Admit: 2024-12-26 | Discharge: 2024-12-26 | Disposition: A | Discharge status: Home / Self Care | Source: Ambulatory Visit | Attending: Pulmonary Disease | Admitting: Pulmonary Disease

## 2024-12-26 ENCOUNTER — Ambulatory Visit: Payer: Medicare (Managed Care) | Attending: Cardiology

## 2024-12-26 DIAGNOSIS — J479 Bronchiectasis, uncomplicated: Secondary | ICD-10-CM

## 2024-12-26 DIAGNOSIS — Z862 Personal history of diseases of the blood and blood-forming organs and certain disorders involving the immune mechanism: Secondary | ICD-10-CM

## 2024-12-26 DIAGNOSIS — I340 Nonrheumatic mitral (valve) insufficiency: Secondary | ICD-10-CM | POA: Diagnosis not present

## 2024-12-26 DIAGNOSIS — I350 Nonrheumatic aortic (valve) stenosis: Secondary | ICD-10-CM | POA: Diagnosis not present

## 2024-12-26 LAB — ECHOCARDIOGRAM COMPLETE
AR max vel: 0.88 cm2
AV Area VTI: 0.99 cm2
AV Area mean vel: 0.93 cm2
AV Mean grad: 18.8 mmHg
AV Peak grad: 39.2 mmHg
AV Vena cont: 0.2 cm
Ao pk vel: 3.13 m/s
Area-P 1/2: 2.62 cm2
MV M vel: 5.16 m/s
MV Peak grad: 106.5 mmHg
P 1/2 time: 460 ms
Radius: 0.5 cm
S' Lateral: 2.9 cm

## 2024-12-30 ENCOUNTER — Ambulatory Visit: Admitting: Podiatry

## 2025-01-14 ENCOUNTER — Ambulatory Visit: Payer: Medicare (Managed Care)

## 2025-01-20 ENCOUNTER — Ambulatory Visit: Payer: Medicare (Managed Care) | Admitting: Podiatry

## 2025-04-30 ENCOUNTER — Ambulatory Visit: Admitting: Allergy and Immunology
